# Patient Record
Sex: Male | Born: 1960 | Race: White | Hispanic: No | Marital: Married | Smoking: Never smoker
Health system: Southern US, Community
[De-identification: ages and names within clinical notes are randomized; demographics above are authoritative.]

## PROBLEM LIST (undated history)

## (undated) DIAGNOSIS — K5790 Diverticulosis of intestine, part unspecified, without perforation or abscess without bleeding: Secondary | ICD-10-CM

## (undated) DIAGNOSIS — E8881 Metabolic syndrome: Secondary | ICD-10-CM

## (undated) DIAGNOSIS — G4730 Sleep apnea, unspecified: Secondary | ICD-10-CM

## (undated) DIAGNOSIS — E119 Type 2 diabetes mellitus without complications: Secondary | ICD-10-CM

## (undated) DIAGNOSIS — L409 Psoriasis, unspecified: Secondary | ICD-10-CM

## (undated) DIAGNOSIS — K219 Gastro-esophageal reflux disease without esophagitis: Secondary | ICD-10-CM

## (undated) DIAGNOSIS — I4811 Longstanding persistent atrial fibrillation: Secondary | ICD-10-CM

## (undated) DIAGNOSIS — B001 Herpesviral vesicular dermatitis: Secondary | ICD-10-CM

## (undated) DIAGNOSIS — E785 Hyperlipidemia, unspecified: Secondary | ICD-10-CM

## (undated) DIAGNOSIS — M1990 Unspecified osteoarthritis, unspecified site: Secondary | ICD-10-CM

## (undated) DIAGNOSIS — Q2112 Patent foramen ovale: Secondary | ICD-10-CM

## (undated) DIAGNOSIS — I10 Essential (primary) hypertension: Secondary | ICD-10-CM

## (undated) DIAGNOSIS — R809 Proteinuria, unspecified: Secondary | ICD-10-CM

## (undated) DIAGNOSIS — E669 Obesity, unspecified: Secondary | ICD-10-CM

## (undated) DIAGNOSIS — G459 Transient cerebral ischemic attack, unspecified: Secondary | ICD-10-CM

## (undated) DIAGNOSIS — T7840XA Allergy, unspecified, initial encounter: Secondary | ICD-10-CM

## (undated) HISTORY — PX: PATENT FORAMEN OVALE(PFO) CLOSURE: CATH118300

## (undated) HISTORY — PX: TOTAL HIP ARTHROPLASTY: SHX124

## (undated) HISTORY — DX: Diverticulosis of intestine, part unspecified, without perforation or abscess without bleeding: K57.90

## (undated) HISTORY — DX: Allergy, unspecified, initial encounter: T78.40XA

## (undated) HISTORY — DX: Essential (primary) hypertension: I10

## (undated) HISTORY — DX: Proteinuria, unspecified: R80.9

## (undated) HISTORY — DX: Unspecified osteoarthritis, unspecified site: M19.90

## (undated) HISTORY — DX: Sleep apnea, unspecified: G47.30

## (undated) HISTORY — DX: Transient cerebral ischemic attack, unspecified: G45.9

## (undated) HISTORY — DX: Gastro-esophageal reflux disease without esophagitis: K21.9

## (undated) HISTORY — DX: Longstanding persistent atrial fibrillation: I48.11

## (undated) HISTORY — DX: Psoriasis, unspecified: L40.9

## (undated) HISTORY — DX: Patent foramen ovale: Q21.12

## (undated) HISTORY — PX: WISDOM TOOTH EXTRACTION: SHX21

## (undated) HISTORY — DX: Hyperlipidemia, unspecified: E78.5

## (undated) HISTORY — DX: Metabolic syndrome: E88.810

## (undated) HISTORY — PX: COLONOSCOPY: SHX174

## (undated) HISTORY — DX: Metabolic syndrome: E88.81

## (undated) HISTORY — PX: TONSILLECTOMY AND ADENOIDECTOMY: SUR1326

## (undated) HISTORY — DX: Herpesviral vesicular dermatitis: B00.1

## (undated) HISTORY — DX: Obesity, unspecified: E66.9

---

## 1999-10-06 ENCOUNTER — Encounter: Payer: Self-pay | Admitting: Cardiology

## 1999-10-06 ENCOUNTER — Ambulatory Visit (HOSPITAL_COMMUNITY): Admission: RE | Admit: 1999-10-06 | Discharge: 1999-10-06 | Payer: Self-pay | Admitting: Cardiology

## 1999-10-21 ENCOUNTER — Encounter: Payer: Self-pay | Admitting: Cardiology

## 1999-10-21 ENCOUNTER — Encounter: Admission: RE | Admit: 1999-10-21 | Discharge: 1999-10-21 | Payer: Self-pay | Admitting: Cardiology

## 1999-10-27 ENCOUNTER — Ambulatory Visit (HOSPITAL_COMMUNITY): Admission: RE | Admit: 1999-10-27 | Discharge: 1999-10-27 | Payer: Self-pay | Admitting: Cardiology

## 2004-08-31 HISTORY — PX: OTHER SURGICAL HISTORY: SHX169

## 2005-10-12 ENCOUNTER — Encounter: Admission: RE | Admit: 2005-10-12 | Discharge: 2006-01-10 | Payer: Self-pay | Admitting: Family Medicine

## 2005-10-26 ENCOUNTER — Ambulatory Visit (HOSPITAL_BASED_OUTPATIENT_CLINIC_OR_DEPARTMENT_OTHER): Admission: RE | Admit: 2005-10-26 | Discharge: 2005-10-26 | Payer: Self-pay | Admitting: Family Medicine

## 2005-11-01 ENCOUNTER — Ambulatory Visit: Payer: Self-pay | Admitting: Family Medicine

## 2005-11-09 ENCOUNTER — Ambulatory Visit (HOSPITAL_BASED_OUTPATIENT_CLINIC_OR_DEPARTMENT_OTHER): Admission: RE | Admit: 2005-11-09 | Discharge: 2005-11-09 | Payer: Self-pay | Admitting: Family Medicine

## 2006-09-08 ENCOUNTER — Ambulatory Visit: Payer: Self-pay | Admitting: Family Medicine

## 2007-08-09 ENCOUNTER — Ambulatory Visit: Payer: Self-pay | Admitting: Family Medicine

## 2007-09-01 DIAGNOSIS — G459 Transient cerebral ischemic attack, unspecified: Secondary | ICD-10-CM

## 2007-09-01 HISTORY — DX: Transient cerebral ischemic attack, unspecified: G45.9

## 2008-06-20 ENCOUNTER — Inpatient Hospital Stay (HOSPITAL_COMMUNITY): Admission: EM | Admit: 2008-06-20 | Discharge: 2008-06-20 | Payer: Self-pay | Admitting: Emergency Medicine

## 2008-06-21 ENCOUNTER — Ambulatory Visit: Payer: Self-pay | Admitting: Family Medicine

## 2008-06-28 ENCOUNTER — Ambulatory Visit (HOSPITAL_COMMUNITY): Admission: RE | Admit: 2008-06-28 | Discharge: 2008-06-28 | Payer: Self-pay | Admitting: Neurology

## 2008-07-04 ENCOUNTER — Ambulatory Visit: Payer: Self-pay

## 2008-07-04 ENCOUNTER — Encounter (INDEPENDENT_AMBULATORY_CARE_PROVIDER_SITE_OTHER): Payer: Self-pay | Admitting: Neurology

## 2008-08-06 ENCOUNTER — Encounter (INDEPENDENT_AMBULATORY_CARE_PROVIDER_SITE_OTHER): Payer: Self-pay | Admitting: Neurology

## 2008-08-06 ENCOUNTER — Ambulatory Visit (HOSPITAL_COMMUNITY): Admission: RE | Admit: 2008-08-06 | Discharge: 2008-08-06 | Payer: Self-pay | Admitting: Cardiology

## 2008-08-21 ENCOUNTER — Ambulatory Visit: Payer: Self-pay | Admitting: Family Medicine

## 2008-09-19 ENCOUNTER — Inpatient Hospital Stay (HOSPITAL_COMMUNITY): Admission: AD | Admit: 2008-09-19 | Discharge: 2008-09-20 | Payer: Self-pay | Admitting: Cardiology

## 2008-10-02 ENCOUNTER — Emergency Department (HOSPITAL_COMMUNITY): Admission: EM | Admit: 2008-10-02 | Discharge: 2008-10-02 | Payer: Self-pay | Admitting: Family Medicine

## 2008-11-19 ENCOUNTER — Encounter: Admission: RE | Admit: 2008-11-19 | Discharge: 2008-11-19 | Payer: Self-pay | Admitting: Cardiology

## 2008-11-21 ENCOUNTER — Ambulatory Visit (HOSPITAL_COMMUNITY): Admission: RE | Admit: 2008-11-21 | Discharge: 2008-11-21 | Payer: Self-pay | Admitting: Cardiology

## 2008-12-25 ENCOUNTER — Emergency Department (HOSPITAL_COMMUNITY): Admission: EM | Admit: 2008-12-25 | Discharge: 2008-12-25 | Payer: Self-pay | Admitting: Family Medicine

## 2009-07-17 ENCOUNTER — Encounter (INDEPENDENT_AMBULATORY_CARE_PROVIDER_SITE_OTHER): Payer: Self-pay | Admitting: *Deleted

## 2009-07-17 ENCOUNTER — Ambulatory Visit: Payer: Self-pay | Admitting: Family Medicine

## 2009-08-01 ENCOUNTER — Telehealth: Payer: Self-pay | Admitting: Gastroenterology

## 2009-08-05 ENCOUNTER — Ambulatory Visit: Payer: Self-pay | Admitting: Gastroenterology

## 2009-08-05 ENCOUNTER — Encounter (INDEPENDENT_AMBULATORY_CARE_PROVIDER_SITE_OTHER): Payer: Self-pay | Admitting: *Deleted

## 2009-08-05 DIAGNOSIS — G4733 Obstructive sleep apnea (adult) (pediatric): Secondary | ICD-10-CM | POA: Insufficient documentation

## 2009-08-05 DIAGNOSIS — Z8679 Personal history of other diseases of the circulatory system: Secondary | ICD-10-CM | POA: Insufficient documentation

## 2009-08-20 ENCOUNTER — Ambulatory Visit: Payer: Self-pay | Admitting: Gastroenterology

## 2010-06-30 ENCOUNTER — Ambulatory Visit: Payer: Self-pay | Admitting: Family Medicine

## 2010-08-05 ENCOUNTER — Ambulatory Visit: Payer: Self-pay | Admitting: Family Medicine

## 2010-09-21 ENCOUNTER — Encounter: Payer: Self-pay | Admitting: Cardiology

## 2010-09-24 ENCOUNTER — Other Ambulatory Visit (HOSPITAL_COMMUNITY): Payer: Self-pay | Admitting: Cardiovascular Disease

## 2010-09-24 DIAGNOSIS — Z8673 Personal history of transient ischemic attack (TIA), and cerebral infarction without residual deficits: Secondary | ICD-10-CM

## 2010-09-24 DIAGNOSIS — I4891 Unspecified atrial fibrillation: Secondary | ICD-10-CM

## 2010-10-09 ENCOUNTER — Ambulatory Visit (HOSPITAL_COMMUNITY)
Admission: RE | Admit: 2010-10-09 | Discharge: 2010-10-09 | Disposition: A | Discharge status: Home / Self Care | Payer: 59 | Source: Ambulatory Visit | Attending: Cardiovascular Disease | Admitting: Cardiovascular Disease

## 2010-10-09 ENCOUNTER — Encounter (HOSPITAL_COMMUNITY): Payer: Self-pay

## 2010-10-09 DIAGNOSIS — Z8673 Personal history of transient ischemic attack (TIA), and cerebral infarction without residual deficits: Secondary | ICD-10-CM

## 2010-10-09 DIAGNOSIS — I10 Essential (primary) hypertension: Secondary | ICD-10-CM | POA: Insufficient documentation

## 2010-10-09 DIAGNOSIS — F17200 Nicotine dependence, unspecified, uncomplicated: Secondary | ICD-10-CM | POA: Insufficient documentation

## 2010-10-09 DIAGNOSIS — I4891 Unspecified atrial fibrillation: Secondary | ICD-10-CM | POA: Insufficient documentation

## 2010-10-09 MED ORDER — TECHNETIUM TC 99M TETROFOSMIN IV KIT
10.0000 | PACK | Freq: Once | INTRAVENOUS | Status: AC | PRN
  Administered 2010-10-09: 10 via INTRAVENOUS

## 2010-10-09 MED ORDER — TECHNETIUM TC 99M TETROFOSMIN IV KIT
30.0000 | PACK | Freq: Once | INTRAVENOUS | Status: AC | PRN
  Administered 2010-10-09: 30 via INTRAVENOUS

## 2010-10-27 ENCOUNTER — Other Ambulatory Visit: Payer: Self-pay | Admitting: Orthopedic Surgery

## 2010-10-27 ENCOUNTER — Encounter (HOSPITAL_COMMUNITY): Payer: 59

## 2010-10-27 ENCOUNTER — Other Ambulatory Visit (HOSPITAL_COMMUNITY): Payer: Self-pay | Admitting: Orthopedic Surgery

## 2010-10-27 ENCOUNTER — Ambulatory Visit (HOSPITAL_COMMUNITY)
Admission: RE | Admit: 2010-10-27 | Discharge: 2010-10-27 | Disposition: A | Discharge status: Home / Self Care | Payer: 59 | Source: Ambulatory Visit | Attending: Orthopedic Surgery | Admitting: Orthopedic Surgery

## 2010-10-27 DIAGNOSIS — Z01818 Encounter for other preprocedural examination: Secondary | ICD-10-CM | POA: Insufficient documentation

## 2010-10-27 DIAGNOSIS — Z7902 Long term (current) use of antithrombotics/antiplatelets: Secondary | ICD-10-CM | POA: Insufficient documentation

## 2010-10-27 DIAGNOSIS — Z01812 Encounter for preprocedural laboratory examination: Secondary | ICD-10-CM | POA: Insufficient documentation

## 2010-10-27 DIAGNOSIS — M1610 Unilateral primary osteoarthritis, unspecified hip: Secondary | ICD-10-CM | POA: Insufficient documentation

## 2010-10-27 DIAGNOSIS — M169 Osteoarthritis of hip, unspecified: Secondary | ICD-10-CM | POA: Insufficient documentation

## 2010-10-27 DIAGNOSIS — I10 Essential (primary) hypertension: Secondary | ICD-10-CM | POA: Insufficient documentation

## 2010-10-27 LAB — COMPREHENSIVE METABOLIC PANEL
Albumin: 4.3 g/dL (ref 3.5–5.2)
Alkaline Phosphatase: 50 U/L (ref 39–117)
CO2: 26 mEq/L (ref 19–32)
Calcium: 9.5 mg/dL (ref 8.4–10.5)
GFR calc Af Amer: >60 mL/min (ref >60)
Glucose, Bld: 88 mg/dL (ref 70–99)
Potassium: 4 mEq/L (ref 3.5–5.1)

## 2010-10-27 LAB — URINALYSIS, ROUTINE W REFLEX MICROSCOPIC
Ketones, ur: NEGATIVE mg/dL
Leukocytes, UA: NEGATIVE
Nitrite: NEGATIVE
Specific Gravity, Urine: 1.023 (ref 1.005–1.030)

## 2010-10-27 LAB — CBC
HCT: 47.9 % (ref 39.0–52.0)
Hemoglobin: 17.3 g/dL — ABNORMAL HIGH (ref 13.0–17.0)
MCV: 87.7 fL (ref 78.0–100.0)
WBC: 7.5 10*3/uL (ref 4.0–10.5)

## 2010-10-27 LAB — APTT: aPTT: 44 seconds — ABNORMAL HIGH (ref 24–37)

## 2010-11-03 ENCOUNTER — Inpatient Hospital Stay (HOSPITAL_COMMUNITY)
Admission: RE | Admit: 2010-11-03 | Discharge: 2010-11-06 | DRG: 470 | Disposition: A | Discharge status: Home Health | Payer: 59 | Source: Ambulatory Visit | Attending: Orthopedic Surgery | Admitting: Orthopedic Surgery

## 2010-11-03 ENCOUNTER — Inpatient Hospital Stay (HOSPITAL_COMMUNITY): Payer: 59

## 2010-11-03 DIAGNOSIS — G4733 Obstructive sleep apnea (adult) (pediatric): Secondary | ICD-10-CM | POA: Diagnosis present

## 2010-11-03 DIAGNOSIS — Z9889 Other specified postprocedural states: Secondary | ICD-10-CM

## 2010-11-03 DIAGNOSIS — I10 Essential (primary) hypertension: Secondary | ICD-10-CM | POA: Diagnosis present

## 2010-11-03 DIAGNOSIS — Z6841 Body Mass Index (BMI) 40.0 and over, adult: Secondary | ICD-10-CM

## 2010-11-03 DIAGNOSIS — Z8673 Personal history of transient ischemic attack (TIA), and cerebral infarction without residual deficits: Secondary | ICD-10-CM

## 2010-11-03 DIAGNOSIS — M169 Osteoarthritis of hip, unspecified: Principal | ICD-10-CM | POA: Diagnosis present

## 2010-11-03 DIAGNOSIS — M1610 Unilateral primary osteoarthritis, unspecified hip: Principal | ICD-10-CM | POA: Diagnosis present

## 2010-11-03 HISTORY — PX: JOINT REPLACEMENT: SHX530

## 2010-11-03 LAB — TYPE AND SCREEN

## 2010-11-03 LAB — ABO/RH: ABO/RH(D): O POS

## 2010-11-04 LAB — CBC
HCT: 41.8 % (ref 39.0–52.0)
MCHC: 35.4 g/dL (ref 30.0–36.0)
MCV: 88 fL (ref 78.0–100.0)
Platelets: 189 10*3/uL (ref 150–400)
RBC: 4.75 MIL/uL (ref 4.22–5.81)
RDW: 13.4 % (ref 11.5–15.5)

## 2010-11-04 LAB — BASIC METABOLIC PANEL
BUN: 10 mg/dL (ref 6–23)
CO2: 29 mEq/L (ref 19–32)
Calcium: 9.2 mg/dL (ref 8.4–10.5)
Chloride: 99 mEq/L (ref 96–112)
Creatinine, Ser: 0.96 mg/dL (ref 0.4–1.5)
GFR calc Af Amer: >60 mL/min (ref >60)
Glucose, Bld: 155 mg/dL — ABNORMAL HIGH (ref 70–99)
Potassium: 4.5 mEq/L (ref 3.5–5.1)
Sodium: 135 mEq/L (ref 135–145)

## 2010-11-04 LAB — PROTIME-INR: INR: 1.13 (ref 0.00–1.49)

## 2010-11-05 LAB — CBC
MCV: 87.6 fL (ref 78.0–100.0)
Platelets: 174 10*3/uL (ref 150–400)
RBC: 4.61 MIL/uL (ref 4.22–5.81)
RDW: 13.3 % (ref 11.5–15.5)

## 2010-11-05 LAB — BASIC METABOLIC PANEL
Calcium: 9.1 mg/dL (ref 8.4–10.5)
Chloride: 100 mEq/L (ref 96–112)
Potassium: 4.3 mEq/L (ref 3.5–5.1)
Sodium: 136 mEq/L (ref 135–145)

## 2010-11-06 LAB — CBC
Hemoglobin: 14.4 g/dL (ref 13.0–17.0)
MCH: 31.3 pg (ref 26.0–34.0)
RBC: 4.6 MIL/uL (ref 4.22–5.81)
WBC: 13 10*3/uL — ABNORMAL HIGH (ref 4.0–10.5)

## 2010-11-06 NOTE — Op Note (Signed)
NAME:  [name], CHAP NO.:  [id number]  MEDICAL RECORD NO.:  [id number]           PATIENT TYPE:  I  LOCATION:  1621                         FACILITY:  [facility]  PHYSICIAN:  Ollen Gross, M.D.    DATE OF BIRTH:  Jun 07, 1961  DATE OF PROCEDURE:  11/03/2010 DATE OF DISCHARGE:                              OPERATIVE REPORT   PREOPERATIVE DIAGNOSIS:  Osteoarthritis, left hip.  POSTOPERATIVE DIAGNOSIS:  Osteoarthritis, left hip.  PROCEDURE:  Left total hip arthroplasty.  SURGEON:  Ollen Gross, MD  ASSISTANT:  Alexzandrew L. Perkins, P.A.C.  ANESTHESIA:  General.  ESTIMATED BLOOD LOSS:  600.  DRAINS:  Hemovac x1.  COMPLICATIONS:  None.  CONDITION:  Stable to recovery.  BRIEF CLINICAL NOTE:  Mr. [name] is a 50 year old male with advanced end- stage arthritis of the left hip with progressively worsening pain and dysfunction.  He has failed nonoperative management and presents for total hip arthroplasty.  PROCEDURE IN DETAIL:  After successful administration of general anesthetic, the patient was placed in the right lateral decubitus position with the left side up and held with the hip positioner.  The left lower extremity was isolated from his perineum with plastic drapes and prepped and draped in the usual sterile fashion.  Standard posterolateral incision was made with #10 blade through the subcutaneous tissue to the level of the fascia lata which was incised in line with the skin incision.  The sciatic nerve was palpated and protected and short rotators and capsule isolated off the femur.  Hip was dislocated and the center of femoral head was marked.  Trial prosthesis was placed such that the center of the trial head corresponds to the center of its native femoral head.  Osteotomy in line was marked on the femoral neck and osteotomy made with an oscillating saw.  Femoral head was removed.  Retractors were placed around the proximal femur for access to  the femoral canal.  The starter reamer was passed and then the canal was thoroughly irrigated.  This was done to remove the fatty contents. Axial reaming was performed up to 15.5 mm proximal reaming to a 20D in the sleeve machine to a small.  20D small trial sleeve was placed.  The femur was retracted anteriorly to gain acetabular exposure. Acetabular retractors were placed and labrum and osteophytes were removed.  Reaming was performed up to 55 mm for placement of 56 mm Pinnacle Acetabular shell.  It was placed in anatomic position with excellent purchase.  It transfixed with 2 additional dome screws.  The apex hole eliminator was placed and a 36-mm neutral plus 4 marathon liner placed.  We then placed the trial stem which is a 20 x 15 with a 36 plus 12 neck.  Native anteversion was neutral, so I put in about 15 degrees anteversion.  A 36.0 head was placed and the hip was reduced with outstanding stability.  There was full extension, full external rotation, 70 degrees flexion, 40 degrees adduction, 90 degrees internal rotation, and 90 degrees of flexion, and 70 degrees of internal rotation.  By placing the left leg on top of  the right, __________leg lengths were equal.  Hip was then dislocated and the trials removed. The permanent 20D small sleeve was placed with 20 x 15 stem, 36 plus 12 neck again about 15 degrees beyond native anteversion.  The 36 plus 0 ceramic head was placed and the hip was reduced with same stability parameters.  Wound was then copiously irrigated with saline solution and short rotators and capsule reattached to the femur through drill holes with Ethibond suture.  Fascia lata was closed over Hemovac drain with interrupted #1 Vicryl, subcu closed with #1 and #2-0 Vicryl, and subcuticular with running 4-0 Monocryl.  The catheter for Marcaine pain pump was placed and the pump initiated.  Incision was cleaned and dried and Steri-Strips and a bulky sterile dressing  were applied.  He was then placed into a knee immobilizer, awakened and transported to recovery in stable condition.     Ollen Gross, M.D.     FA/MEDQ  D:  11/03/2010  T:  11/04/2010  Job:  [id number]  Electronically Signed by Ollen Gross M.D. on 11/05/2010 03:51:36 PM

## 2010-11-19 NOTE — H&P (Signed)
[name], SIEDSCHLAG NO.:  [id number]  MEDICAL RECORD NO.:  [id number]           PATIENT TYPE:  I  LOCATION:  X001                         FACILITY:  [facility]  PHYSICIAN:  Ollen Gross, M.D.    DATE OF BIRTH:  Aug 09, 1961  DATE OF ADMISSION:  11/03/2010 DATE OF DISCHARGE:                             HISTORY & PHYSICAL   CHIEF COMPLAINT:  Left hip pain.  HISTORY OF PRESENT ILLNESS:  The patient 50-year male who has been seen by Dr. Lequita Halt for ongoing hip pain for many years now.  It has progressing gotten worse over the past several years.  He has been seen in the office, and x-rays show severe end-stage arthritis with bone-on- bone throughout with large osteophyte formation.  It is interfering with his daily activities.  It is felt he would benefit undergoing surgical intervention.  He has been seen preop by Dr. Susann Givens and felt to be stable for surgery.  ALLERGIES:  No known drug allergies.  CURRENT MEDICATIONS: 1. Nexium 40 mg. 2. Benazepril 40 mg. 3. Plavix 75 mg. 4. Diltiazem 240 mg.  PAST MEDICAL HISTORY: 1. History of a CVA in October 2010. 2. Sleep apnea, uses CPAP. 3. Hypertension. 4. Cardiac murmur. 5. Heart murmur secondary to patent foramen ovale. 6. Psoriasis.  PAST SURGICAL HISTORY:  Heart surgery with closure of PFO.  FAMILY HISTORY:  Father with cancer.  Mother with cancer.  SOCIAL HISTORY:  Married, is a Product/process development scientist.  No alcohol. Nonsmoker.  His wife will be assisting with his care after surgery. There are 4 steps entering his home.  He does have a living will and a healthcare power of attorney.  REVIEW OF SYSTEMS:  GENERAL:  No fevers, chills, or night sweats. NEUROLOGIC:  No seizures, syncope, or paralysis.  RESPIRATORY:  No shortness breath, productive cough, or hemoptysis.  CARDIOVASCULAR:  No chest pain or orthopnea.  GI:  No nausea, vomiting, diarrhea, or constipation.  GU:  No dysuria or hematuria.   MUSCULOSKELETAL:  Hip pain.  PHYSICAL EXAMINATION:  VITAL SIGNS:  Pulse 64, respirations 12, blood pressure 142/82.  GENERAL:  A 50 year old white male, well nourished, well developed, overweight, barrel-chested individual.  He is alert, oriented, and cooperative.  Good historian.  He is accompanied by his wife. HEENT:  Normocephalic, atraumatic.  Pupils are round and reactive.  EOMs intact. NECK:  Supple.  No carotid bruits are appreciated. CHEST:  Barrel-chested individual.  Clear anterior posterior chest walls. HEART:  Regular rate and rhythm with a faint systolic ejection murmur, slight over aortic and pulmonic points. ABDOMEN:  Mildly firm, protuberant abdomen.  Bowel sounds are present. RECTAL, BREASTS, AND GENITALIA:  Not done, not pertinent for present illness. EXTREMITIES:  Left hip, flexion 90, internal rotation about 20 degrees, external rotation contracture, abduction about 20-30 degrees.  IMPRESSION:  Osteoarthritis, left hip.  PLAN:  The patient is admitted to [facility] to undergo a left total hip replacement arthroplasty.  Surgery will be performed by Ollen Gross.     Alexzandrew L. Julien Girt, P.A.C.   ______________________________ Ollen Gross, M.D.    ALP/MEDQ  D:  11/03/2010  T:  11/03/2010  Job:  [id number]  cc:   Dr. Ozella Rocks, M.D. Fax: [phone]  Electronically Signed by Patrica Duel P.A.C. on 11/06/2010 10:36:31 AM Electronically Signed by Ollen Gross M.D. on 11/19/2010 08:17:34 AM

## 2010-12-11 LAB — PROTIME-INR: INR: 2.5 — ABNORMAL HIGH (ref 0.00–1.49)

## 2010-12-12 NOTE — Discharge Summary (Signed)
[name], RUHLMAN NO.:  [id number]  MEDICAL RECORD NO.:  [id number]           PATIENT TYPE:  I  LOCATION:  1621                         FACILITY:  [facility]  PHYSICIAN:  Ollen Gross, M.D.    DATE OF BIRTH:  1961-05-08  DATE OF ADMISSION:  11/03/2010 DATE OF DISCHARGE:  11/06/2010                              DISCHARGE SUMMARY   ADMITTING DIAGNOSES: 1. Osteoarthritis, left hip. 2. Past history of cerebrovascular accident, October 2010. 3. Sleep apnea. 4. Hypertension. 5. Cardiac murmur secondary to patent foramen ovale. 6. Psoriasis.  DISCHARGE DIAGNOSES: 1. Osteoarthritis, left hip, status post left total hip replacement     arthroplasty. 2. Past history of cerebrovascular accident, October 2010. 3. Sleep apnea. 4. Hypertension. 5. Cardiac murmur secondary to patent foramen ovale. 6. Psoriasis.  PROCEDURE:  November 03, 2010, left total hip.  SURGEON:  Ollen Gross, MD  ASSISTANT:  Alexzandrew L. Julien Girt, PAC.  ANESTHESIA:  General.  BLOOD LOSS:  600 mL.  CONSULTS:  None.  BRIEF HISTORY:  Mr. [name] is a 50 year old male with advanced arthritis of the left hip, progressive worsening pain and dysfunction, failed nonoperative management, now presents for total hip arthroplasty.  LABORATORY DATA:  I do not have the admission CBC but the postop hemoglobin was 14.8, dropped down to 14.2, last H and H 14.4 and 40.2. PT/INR postop 14.7 and 1.13.  I do not have the admission Chem panel, it was not scanned into the chart.  Serial BMETs were followed for 48 hours.  Electrolytes remained within normal limits.  Glucose was elevated at 155, came back down to 123 prior to discharge.  Blood group type O+.  Postoperative hip films and pelvis films on November 03, 2010, no complication after left total hip.  HOSPITAL COURSE:  The patient was admitted to [facility], taken to OR, underwent above-stated procedure without complication.  The patient  tolerated procedure well, later transferred to the recovery room at orthopedic floor, started on p.o. and IV analgesic for pain control following surgery, had PCA which was discontinued on the morning of day #1, did have a little bit intermittent nausea felt to be due to the IV narcotics, so we discontinued that, started partial weightbearing 25- 50%, had good urine output.  By day #2, the patient was doing a little bit better, able to get a little bit better sleep.  Still some intermittent nausea but slowly improving.  Dressing changed, incision looked good, so we discontinued the fluids once the patient's nausea had improved and tolerating p.o.'s well.  Continued to progress well with physical therapy and by postop day #3, the patient's nausea was gone. The patient is doing well, tolerating medications, and discharged home.  DISCHARGE PLAN: 1. The patient was discharged home on November 06, 2010. 2. Discharge diagnoses, please see above. 3. Discharge medications, Oxy-IR 10, Robaxin, Xarelto.  Continue home     meds of benazepril, diltiazem, Metamucil, and Nexium.  DIET:  Heart-healthy diet.  ACTIVITY:  Partial weightbearing 25-50%.  Hip precautions, total hip protocol.  Follow up in 2 weeks.  DISPOSITION:  Home.  CONDITION  ON DISCHARGE:  Improved.     Alexzandrew L. Julien Girt, P.A.C.   ______________________________ Ollen Gross, M.D.    ALP/MEDQ  D:  12/04/2010  T:  12/04/2010  Job:  [id number]  cc:   Dr. Coralee Pesa  Dr. Jonny Ruiz _____  Electronically Signed by Patrica Duel P.A.C. on 12/08/2010 07:49:22 AM Electronically Signed by Ollen Gross M.D. on 12/12/2010 09:01:01 AM

## 2010-12-15 LAB — STOOL CULTURE

## 2011-01-13 NOTE — Cardiovascular Report (Signed)
NAMEBRUNO, LEACH NO.:  [id number]   MEDICAL RECORD NO.:  [id number]          PATIENT TYPE:  INP   LOCATION:  2502                         FACILITY:  [facility]   PHYSICIAN:  Cristy Hilts. Jacinto Halim, MD       DATE OF BIRTH:  August 27, 1961   DATE OF PROCEDURE:  09/19/2008  DATE OF DISCHARGE:                            CARDIAC CATHETERIZATION   PROCEDURE PERFORMED:  Intracardiac echocardiogram-guided closure of the  patent foramen ovale.   INDICATION:  Mr. [name] is a 50 year old gentleman with a  cardioembolic stroke that occurred in October 2009.  It was felt to be  cardioembolic.  He was initially randomized in the RESPECT secondary  prevention trial for medical arm; however, he opted out of the medical  arm and wanted to have the closure of the PFO outside of the trial.   Given the fact that he had no other significant cardiovascular risk  except morbid obesity and metabolic syndrome, we felt that cardioembolic  stroke was most likely and PFO was probably the most likely culprit as  it was strongly positive with right-to-left shunting.  Hence, he was  brought to the catheterization lab for intracardiac echo-guided closure  of the patent foramen ovale.   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  There was also color flow evidence of right-to-left shunting.   INTERVENTION DATA:  Successful closure of the patent foramen ovale with  implantation of a 28 mm StarFlex septal occluder.  Postprocedure, there  was some mild to moderately positive residual right-to-left shunting.   RECOMMENDATION:  I expect complete closure of the patent foramen ovale  over a period of 3-6 months with endothelialization of the closure  device.  He will be discharged home in the morning with outpatient echo  to be done in 1-2  days.  He will need aspirin indefinitely and Plavix  for at least a period of 3 months.  He will need endocarditis  prophylaxis for a period of 6 months.   TECHNIQUE OF THE PROCEDURE:  Under usual sterile precautions using an 8-  [place] and a 9-French right femoral venous access, an intracardiac echo  probe was advanced into the right atrium and the cardiac structures were  analyzed.  Then using a multipurpose A2 catheter, I was easily able to  cross the patent foramen ovale.  The PFO was sized with a 25-mm NMT  sizing balloon, which measured out to be about 10-12 mm.  I decided to  implant a 28-mm septal occluder given significant aneurysmal septum and  thick septum secundum.  The device was appropriately prepped and after  crossing an 11-French Mullen sheath across the intra-atrial septum, left  atrial side, and then the right atrial side of the device was deployed  under ICE guidance and fluoroscopic guidance and position  of the intra-atrial septum between the 2 disks was confirmed and then  the device was released.  Double contrast injection was also performed.  The patient tolerated the procedure well.  The sheath was exchanged to a  short sheath.  During procedure, heparin was administered, and the ACT  was maintained greater than 200.      Cristy Hilts. Jacinto Halim, MD  Electronically Signed     JRG/MEDQ  D:  09/19/2008  T:  09/20/2008  Job:  [id number]   cc:   Pramod P. Pearlean Brownie, MD  Sharlot Gowda, M.D.

## 2011-01-13 NOTE — Discharge Summary (Signed)
[name], [name] NO.:  [id number]   MEDICAL RECORD NO.:  [id number]          PATIENT TYPE:  INP   LOCATION:  2502                         FACILITY:  [facility]   PHYSICIAN:  Cristy Hilts. Jacinto Halim, MD       DATE OF BIRTH:  01-22-1961   DATE OF ADMISSION:  09/19/2008  DATE OF DISCHARGE:  09/20/2008                               DISCHARGE SUMMARY   DISCHARGE DIAGNOSES:  1. Patent foramen ovale status post elective closure this admission.  2. Hypertension.  3. History of a left frontal cerebrovascular accident, October 2009.  4. Gastroesophageal reflux.   HOSPITAL COURSE:  The patient is a 50 year old male with hypertension  and borderline dyslipidemia who was admitted to [facility] with  dysarthria in October 2009.  He was eventually diagnosed with left brain  CVA.  It was felt to be cardioembolic.  Workup of his stroke included a  moderate to large size PFO.  He had strongly positive right-to-left  shunting by contrast injection.  He was seen by Dr. Jacinto Halim on September 13, 2008, set up for elective PFO closure.  Please see his note for complete  details.  The patient was admitted to outpatient cath lab and underwent  PFO closure which he tolerated well.  He should be on aspirin  indefinitely and Plavix for 3 months.  He needs endocarditis prophylaxis  for 6 months.  Dr. Jacinto Halim feels he be discharged on September 20, 2008.  He  will get an echo in the office this afternoon.   DISCHARGE MEDICATIONS:  1. Nexium 40 mg a day.  2. Lotrel 10/40 daily.  3. Simvastatin 20 mg a day.  4. Plavix 75 mg a day for 3 months.  5. Enteric-coated aspirin 325 mg daily indefinitely.   PREADMISSION LABORATORY DATA:  White count 7.5, hemoglobin 17.1,  hematocrit 48.9, platelets 184.  INR 1.0.  TSH 1.27.  BUN 21, creatinine  1.1.  Sodium 142, potassium 4.3.  Urinalysis is unremarkable.  EKG shows  sinus rhythm with septal Q-wave and poor anterior R-wave progression.  Chest x-ray shows  cardiomegaly.   DISPOSITION:  The patient is discharged in stable condition and will  follow up in the office this afternoon with an echo and then followup  with Dr. Jacinto Halim.  He did have some diarrhea before discharge and stool  culture was obtained and will need to follow this up as an outpatient.      Abelino Derrick, P.A.      Cristy Hilts. Jacinto Halim, MD  Electronically Signed    LKK/MEDQ  D:  09/20/2008  T:  09/20/2008  Job:  [id number]   cc:   Sharlot Gowda, M.D.  Pramod P. Pearlean Brownie, MD

## 2011-01-13 NOTE — Discharge Summary (Signed)
NAMELAMBERT, [name] NO.:  [id number]   MEDICAL RECORD NO.:  [id number]          PATIENT TYPE:  INP   LOCATION:  4705                         FACILITY:  [facility]   PHYSICIAN:  Renee Ramus, MD       DATE OF BIRTH:  Aug 14, 1961   DATE OF ADMISSION:  06/20/2008  DATE OF DISCHARGE:  06/20/2008                               DISCHARGE SUMMARY   PRIMARY DISCHARGE DIAGNOSIS:  Transient ischemic attack with transient  dysarthria.   SECONDARY DIAGNOSES:  1. Hypertension.  2. Gastroesophageal reflux disease.  3. Obesity.  4. Psoriasis.  5. Obstructive sleep apnea.   HOSPITAL COURSE:  1. Dysarthria and TIA.  The patient is a 50 year old male who was      admitted secondary to dysarthria and TIA.  The patient had no      previous stroke history.  The patient's symptoms have resolved.      The patient did have a CTA that showed no evidence of vascular      abnormalities and patent carotids bilaterally.  The patient is now      being discharged with instructions to follow up with his primary      care physician.  The patient will go home on aspirin.  He did have      a lipid panel ordered.  This is still pending.  The patient will be      going home on low-dose Crestor in addition to instruction to follow      up with his primary care physician within the next 2-3 days to      adjust blood pressure medications for goal systolic blood pressure      less than 120.  The patient is anxious for discharge and wishes to      go home as soon as possible  2. Hypertension as above.  Primary care physician will adjust      medications.  3. Gastroesophageal  reflux disease.  The patient will continue on his      proton-pump inhibitor.  4. Psoriasis.  Currently stable.  5. Obstructive sleep apnea.  The patient will continue with his with      his CPAP at night.  6. Obesity.  This is stable.  We have counseled the patient with      respect to weight loss.   LABORATORY DATA:  Of  note;  1. Mild erythrocytosis with hemoglobin of 17.3, hematocrit of 50.9.  2. Glucose mildly elevated at 113.  3. UA showing no evidence of infection.   STUDIES:  1. Chest x-ray showing cardiomegaly, but no evidence of CHF or acute      lung process.  2. CT head without contrast showing no acute disease.  3. CT angiogram of the neck and head showing no evidence of disease or      any unremarkable findings.   MEDICATIONS ON DISCHARGE:  1. Lotrel 10/20 one p.o. daily.  2. Nexium 40 mg p.o. daily.  3. Crestor 10 mg p.o. daily.  4. Aspirin 81 mg p.o. daily.   Lipid panel is currently  pending, otherwise there are no labs or studies  pending.  At the time of discharge, the patient is in stable condition  and anxious for discharge.   TIME SPENT:  Thirty-five minutes.      Renee Ramus, MD  Electronically Signed     JF/MEDQ  D:  06/20/2008  T:  06/21/2008  Job:  [id number]   cc:   Sharlot Gowda, M.D.

## 2011-01-13 NOTE — Cardiovascular Report (Signed)
NAMEJONEL, WELDON NO.:  [id number]   MEDICAL RECORD NO.:  [id number]          PATIENT TYPE:  OIB   LOCATION:  2899                         FACILITY:  [facility]   PHYSICIAN:  Cristy Hilts. Jacinto Halim, MD       DATE OF BIRTH:  Aug 05, 1961   DATE OF PROCEDURE:  11/21/2008  DATE OF DISCHARGE:  11/21/2008                            CARDIAC CATHETERIZATION   PROCEDURE PERFORMED:  Direct current cardioversion.   INDICATIONS:  Atrial fibrillation.   TECHNIQUE:  Using 350 mg of Pentothal to achieve deep sedation and help  of anesthesia, synchronized direct current cardioversion was performed  using 120 joules x2 with successful cardioversion of atrial fibrillation  to sinus rhythm.  The patient tolerated the procedure well.  No  immediate complications noted.      Cristy Hilts. Jacinto Halim, MD  Electronically Signed     JRG/MEDQ  D:  11/21/2008  T:  11/22/2008  Job:  [id number]

## 2011-01-16 NOTE — Procedures (Signed)
NAME:  [name], [name] NO.:  [id number]   MEDICAL RECORD NO.:  [id number]          PATIENT TYPE:  OUT   LOCATION:  SLEEP CENTER                 FACILITY:  [facility]   PHYSICIAN:  Clinton D. Maple Hudson, M.D. DATE OF BIRTH:  07-30-1961   DATE OF STUDY:  10/26/2005                              NOCTURNAL POLYSOMNOGRAM   REFERRING PHYSICIAN:  Dr. Sharlot Gowda.   DATE OF STUDY:  October 26, 2005.   INDICATION FOR STUDY:  Hypersomnia with sleep apnea. Epworth sleepiness  score 7/24, BMI 42.6, weight 298 pounds. Home medications: Lotrel, Nexium.  Note that the patient sleeps in a recliner because of back pain. An NPSG  protocol was requested.   SLEEP ARCHITECTURE:  Total sleep time 331 minutes with sleep efficiency 82%.  Stage I was 9%, stage II 77%, stages III and IV 2%, REM 13% of total sleep  time. Sleep latency 53 minutes, REM latency 165 minutes, awake after sleep  onset 29 minutes, arousal index 17.4. No bedtime medication taken.   RESPIRATORY DATA:  Apnea-hypopnea index (AHI, RDI) 47.3 obstructive events  per hour. This reflected 174 obstructive apneas and 87 hypopneas. Events  were not positional, significantly frequent while supine and on his left  side. He slept on his left side through much of the night. REM AHI 70 per  hour.   OXYGEN DATA:  Loud snoring with oxygen desaturation to a nadir of 75%. Mean  oxygen saturation through the study was 92% on room air.   CARDIAC DATA:  Sinus rhythm with PVCs.   MOVEMENT/PARASOMNIA:  Occasional leg jerk with little effect on sleep.   IMPRESSION/RECOMMENDATION:  1.  Severe obstructive sleep apnea/hypopnea syndrome, apnea-hypopnea index      47.3 per hour with events not positional, recorded while supine and on      left side. Loud snoring with oxygen desaturation to a nadir of 75%.  2.  Consider return for continuous positive airway pressure titration or      evaluate for alternative therapies as      appropriate.  3.   Note effect of back pain limiting preferred sleep positions.      Clinton D. Maple Hudson, M.D.  Diplomate, Biomedical engineer of Sleep Medicine  Electronically Signed     CDY/MEDQ  D:  11/01/2005 08:55:22  T:  11/01/2005 23:48:30  Job:  [id number]

## 2011-01-16 NOTE — Cardiovascular Report (Signed)
[place]  Patient:    [name], [name]                       MRN: [id number] Proc. Date: 10/27/99 Adm. Date:  [id number] Attending:  Loreli Dollar CC:         Ronnald Nian, M.D.             Cardiac Catheterization Laboratory                        Cardiac Catheterization  PROCEDURES: 1. Left heart catheterization. 2. Ventriculography in the right anterior oblique projection x 2. 3. Selective right and left coronary arteriography.  COMPLICATIONS:  None.  INDICATIONS FOR PROCEDURE:  [name] is a 50 year old male who has been in good health except for mild blood pressure elevation.  He had a abnormal ECG with T ave abnormalities on a routine physical examination and underwent a Cardiolite study that showed a 44% ejection fraction scar formation on the inferior apical wall nd questionable ischemia at the apex of the heart.  Because of this he is brought n for a cardiac catheterization.  DESCRIPTION OF PROCEDURE:  After obtaining informed consent, the patient was prepared and draped in the usual sterile fashion exposing the right groin. Following local anesthetic with 1% Xylocaine the Seldinger technique was employed, and a 6 [place] introducer sheath was placed in the right femoral artery.  The 6  French Judkins configuration catheters were used.  Selective right and left coronary arteriography and ventriculography in the RAO projection was performed.  RESULTS: 1. Hemodynamic monitoring:  Central aortic pressure 141/86, left    ventricular pressure 146/21 and there was a 3 mm aortic valve gradient noted    at the time of pullback. 2. Ventriculography:  Ventriculography in the RAO projection revealed    the left ventricle to be slightly dilated.  There was normal    left ventricular systolic function.  There was mild left ventricular    hypertrophy.  Ejection fraction calculated at 78% and the end-diastolic    pressure was  22.  CORONARY ARTERIOGRAPHY:  There was no calcification noted on fluoroscopy. 1. Left main:  Normal. 2. LAD.  The LAD extended down to the apex of the heart.  The proximal and    mid LAD was about 4 mm and it tapered to about 2 mm in the distal segment.    No discrete obstructions were noted.  There was marked tortuosity.  The    diagonal was also free of disease. 3. Circumflex:  The circumflex gave rise to two large OM vessels.  This entire    system was free of disease. 4. Right coronary artery:  The right coronary artery was a dominant vessel    giving rise to a PDA and a posterolateral branch.  This system was free    of disease.  CONCLUSIONS: 1. No evidence of coronary artery disease. 2. Slight dilatation of left ventricle with normal systolic function. 3. Mild left ventricular hypertrophy.  DISCUSSION:  At this point I suspect the nuclear study was clearly a false-positive study.  There is nothing to suggest that [name] has CAD and all he needs is  treatment for his hypertension. DD:  10/27/99 TD:  10/27/99 Job: [id number]

## 2011-01-16 NOTE — Procedures (Signed)
NAME:  [name], [name] NO.:  [id number]   MEDICAL RECORD NO.:  [id number]          PATIENT TYPE:  OUT   LOCATION:  SLEEP CENTER                 FACILITY:  [facility]   PHYSICIAN:  Clinton D. Maple Hudson, M.D. DATE OF BIRTH:  24-Oct-1960   DATE OF STUDY:  11/09/2005                              NOCTURNAL POLYSOMNOGRAM   REFERRING PHYSICIAN:  Dr. Sharlot Gowda.   INDICATIONS FOR STUDY:  Hypersomnia with sleep apnea.   EPWORTH SLEEPINESS SCORE:  8/24. BMI 42.8.  Weight 300 pounds.   MEDICATIONS:  Lotrel, Nexium.   A baseline diagnostic study on October 26, 2005 recorded a AHI of 47.3 per  hour.  CPAP titration is requested.   SLEEP ARCHITECTURE:  Total sleep time 300 minutes with sleep efficiency 80%.  Stage I was 8%, stage II 80%, stages III and IV were absent.  REM 12% of  total sleep time.  Sleep latency 6 minutes, REM latency 84 minutes.  Awake  after sleep onset 69 minutes.  Arousal index 27.  No bedtime medication was  taken.   RESPIRATORY DATA:  CPAP titration to 22 CWP, AHI 0 per hour.  A large  Respironics ComfortGel full face mask was used with heated humidifier.   OXYGEN DATA:  Snoring was suppressed at final CPAP pressures with oxygen  saturation on CPAP held 94 to 98% on room air.   CARDIAC DATA:  Recurrent unifocal PVCs.   MOVEMENT/PARASOMNIA:  A total of 630 limb jerks were recorded of which 18  were associated with arousal or awakening for a periodic limb movement with  arousal index of 3.6 per hour which is mildly increased.  No bathroom trips.  Because of back problems, he could not stay on his back (sleeps on a  recliner at home).  He propped himself with multiple pillows under his head,  arms and between his legs.   IMPRESSION/RECOMMENDATIONS:  1.  Successful CPAP titration to a relatively high pressure of 22 CWP, AHI 0      per hour.  This may require a bilevel machine.  A large Respironics      comfort full face mask was used with heated  humidifier.  2.  Baseline MPSG on October 26, 2005 recorded an AHI of 47.3 per hour.  3.  Frequent PVCs.  4.  Back discomfort requires sleeping in a recliner at home and use of      multiple pillows while in bed for this sleep study.      Clinton D. Maple Hudson, M.D.  Diplomate, Biomedical engineer of Sleep Medicine  Electronically Signed     CDY/MEDQ  D:  11/29/2005 10:34:16  T:  11/30/2005 11:35:05  Job:  [id number]

## 2011-03-05 ENCOUNTER — Telehealth: Payer: Self-pay | Admitting: Family Medicine

## 2011-03-05 NOTE — Telephone Encounter (Signed)
PT'S INS DENIED TACLONEX, PER JCL DERMATOLOGIST NEEDS TO HANDLE CHANGING RX TO FORMULARY.  CALLED PT AND HE WILL MAKE APPT WITH DERMATOLOGIST.  LIST OF PREFERRED MEDS ARE IN HIS CHART-LM

## 2011-06-01 ENCOUNTER — Other Ambulatory Visit: Payer: Self-pay | Admitting: Family Medicine

## 2011-06-02 LAB — URINALYSIS, ROUTINE W REFLEX MICROSCOPIC
Leukocytes, UA: NEGATIVE
Nitrite: NEGATIVE
Protein, ur: 30 — AB
Specific Gravity, Urine: 1.012
Urobilinogen, UA: 1

## 2011-06-02 LAB — TYPE AND SCREEN
ABO/RH(D): O POS
Antibody Screen: NEGATIVE

## 2011-06-02 LAB — CBC
HCT: 50.9
Platelets: 188
WBC: 6.9

## 2011-06-02 LAB — LIPID PANEL
Cholesterol: 169
Total CHOL/HDL Ratio: 6.3
VLDL: 46 — ABNORMAL HIGH

## 2011-06-02 LAB — PROTIME-INR
INR: 1
Prothrombin Time: 13.5

## 2011-06-02 LAB — DIFFERENTIAL
Eosinophils Relative: 3
Lymphocytes Relative: 36
Lymphs Abs: 2.5
Neutro Abs: 3.5

## 2011-06-02 LAB — BASIC METABOLIC PANEL
BUN: 16
GFR calc non Af Amer: >60
Potassium: 3.9
Sodium: 138

## 2011-06-02 LAB — URINE MICROSCOPIC-ADD ON

## 2011-06-02 LAB — APTT: aPTT: 34

## 2011-07-21 ENCOUNTER — Encounter: Payer: Self-pay | Admitting: Family Medicine

## 2011-08-17 ENCOUNTER — Ambulatory Visit (INDEPENDENT_AMBULATORY_CARE_PROVIDER_SITE_OTHER): Payer: 59 | Admitting: Family Medicine

## 2011-08-17 ENCOUNTER — Encounter: Payer: Self-pay | Admitting: Family Medicine

## 2011-08-17 VITALS — BP 146/90 | HR 69 | Ht 70.5 in | Wt 305.0 lb

## 2011-08-17 DIAGNOSIS — L408 Other psoriasis: Secondary | ICD-10-CM

## 2011-08-17 DIAGNOSIS — E669 Obesity, unspecified: Secondary | ICD-10-CM | POA: Insufficient documentation

## 2011-08-17 DIAGNOSIS — Z8679 Personal history of other diseases of the circulatory system: Secondary | ICD-10-CM

## 2011-08-17 DIAGNOSIS — K319 Disease of stomach and duodenum, unspecified: Secondary | ICD-10-CM | POA: Insufficient documentation

## 2011-08-17 DIAGNOSIS — G4730 Sleep apnea, unspecified: Secondary | ICD-10-CM

## 2011-08-17 DIAGNOSIS — L4050 Arthropathic psoriasis, unspecified: Secondary | ICD-10-CM | POA: Insufficient documentation

## 2011-08-17 DIAGNOSIS — E66813 Obesity, class 3: Secondary | ICD-10-CM

## 2011-08-17 DIAGNOSIS — Z8673 Personal history of transient ischemic attack (TIA), and cerebral infarction without residual deficits: Secondary | ICD-10-CM

## 2011-08-17 DIAGNOSIS — K219 Gastro-esophageal reflux disease without esophagitis: Secondary | ICD-10-CM

## 2011-08-17 DIAGNOSIS — L400 Psoriasis vulgaris: Secondary | ICD-10-CM

## 2011-08-17 DIAGNOSIS — Z0000 Encounter for general adult medical examination without abnormal findings: Secondary | ICD-10-CM

## 2011-08-17 DIAGNOSIS — Z23 Encounter for immunization: Secondary | ICD-10-CM

## 2011-08-17 MED ORDER — DILTIAZEM HCL ER COATED BEADS 240 MG PO CP24: 240.0000 mg | ORAL_CAPSULE | Freq: Every day | ORAL | Status: DC

## 2011-08-17 MED ORDER — BENAZEPRIL HCL 40 MG PO TABS: 40.0000 mg | ORAL_TABLET | Freq: Every day | ORAL | Status: DC

## 2011-08-17 MED ORDER — ESOMEPRAZOLE MAGNESIUM 40 MG PO CPDR: 40.0000 mg | DELAYED_RELEASE_CAPSULE | ORAL | Status: DC

## 2011-08-17 NOTE — Progress Notes (Signed)
Subjective:    Patient ID: [name], male    DOB: 04/26/1961, 50 y.o.   MRN: [id number]  HPI He is here for complete examination. He had a hip replacement in March. Over the last 4 months he has been involved in a new exercise program. He has lost 3 inches in his waist. He does use Nexium every 3 days which controls his symptoms. He did see his cardiologist prior to the hip surgery. He continues to be plagued by his psoriasis and does plan to followup in the near future with dermatology concerning this. He also has sleep apnea and presently is using the CPAP machine with good results. He has not had a readout recently. He has had no chest pain, shortness of breath or weakness, numbness or tingling. He is seen periodically by neurology. His work continues to go slowly. His marriage is going quite well.   Review of Systems Negative except as above    Objective:   Physical Exam BP 146/90  Pulse 69  Ht 5' 10.5" (1.791 m)  Wt 305 lb (138.347 kg)  BMI 43.14 kg/m2  General Appearance:    Alert, cooperative, no distress, appears stated age  Head:    Normocephalic, without obvious abnormality, atraumatic  Eyes:    PERRL, conjunctiva/corneas clear, EOM's intact, fundi    benign  Ears:    Normal TM's and external ear canals  Nose:   Nares normal, mucosa normal, no drainage or sinus   tenderness  Throat:   Lips, mucosa, and tongue normal; teeth and gums normal  Neck:   Supple, no lymphadenopathy;  thyroid:  no   enlargement/tenderness/nodules; no carotid   bruit or JVD  Back:    Spine nontender, no curvature, ROM normal, no CVA     tenderness  Lungs:     Clear to auscultation bilaterally without wheezes, rales or     ronchi; respirations unlabored  Chest Wall:    No tenderness or deformity   Heart:    Regular rate and rhythm, S1 and S2 normal, no murmur, rub   or gallop  Breast Exam:    No chest wall tenderness, masses or gynecomastia  Abdomen:     Soft, non-tender, nondistended,  normoactive bowel sounds,    no masses, no hepatosplenomegaly  Genitalia:    scrotum does have plaque-like lesions.  Testicles without masses.  No inguinal hernias.  Rectal:   deferred   Extremities:   No clubbing, cyanosis or edema  Pulses:   2+ and symmetric all extremities  Skin:  multiple erythematous plaque-like lesions are noted over his torso, arms and legs.   Lymph nodes:   Cervical, supraclavicular, and axillary nodes normal  Neurologic:   CNII-XII intact, normal strength, sensation and gait; reflexes 2+ and symmetric throughout          Psych:   Normal mood, affect, hygiene and grooming.           Assessment & Plan:   1. Routine general medical examination at a health care facility  TB Skin Test, PSA, CBC with Differential, Comprehensive metabolic panel, Lipid panel  2. GERD (gastroesophageal reflux disease)    3. Obesity, Class III, BMI 40-49.9 (morbid obesity)    4. Plaque psoriasis    5. Hx-TIA (transient ischemic attack)    6. Hx of atrial fibrillation, no current medication    7. Sleep apnea     we will give a download from his CPAP machine. He will be seen by  dermatology in the near future. He is to continue on his GERD medication. Continue on his benazepril.

## 2011-08-18 ENCOUNTER — Telehealth: Payer: Self-pay

## 2011-08-18 LAB — COMPREHENSIVE METABOLIC PANEL
Albumin: 4.7 g/dL (ref 3.5–5.2)
Alkaline Phosphatase: 60 U/L (ref 39–117)
BUN: 13 mg/dL (ref 6–23)
CO2: 25 mEq/L (ref 19–32)
Glucose, Bld: 99 mg/dL (ref 70–99)
Potassium: 4.3 mEq/L (ref 3.5–5.3)
Sodium: 138 mEq/L (ref 135–145)
Total Protein: 7.1 g/dL (ref 6.0–8.3)

## 2011-08-18 LAB — LIPID PANEL
Cholesterol: 180 mg/dL (ref 0–200)
HDL: 42 mg/dL (ref >39)
LDL Cholesterol: 97 mg/dL (ref 0–99)
Triglycerides: 206 mg/dL — ABNORMAL HIGH (ref <150)

## 2011-08-18 LAB — CBC WITH DIFFERENTIAL/PLATELET
Basophils Relative: 1 % (ref 0–1)
Eosinophils Absolute: 0.2 10*3/uL (ref 0.0–0.7)
HCT: 52.5 % — ABNORMAL HIGH (ref 39.0–52.0)
Hemoglobin: 18.6 g/dL — ABNORMAL HIGH (ref 13.0–17.0)
Lymphs Abs: 3.1 10*3/uL (ref 0.7–4.0)
MCH: 32.2 pg (ref 26.0–34.0)
MCHC: 35.4 g/dL (ref 30.0–36.0)
Monocytes Absolute: 0.5 10*3/uL (ref 0.1–1.0)
Monocytes Relative: 6 % (ref 3–12)
Neutro Abs: 4.4 10*3/uL (ref 1.7–7.7)
RBC: 5.78 MIL/uL (ref 4.22–5.81)

## 2011-08-18 NOTE — Telephone Encounter (Signed)
Called pt to find who takes care of his machine he told me [facility]

## 2011-10-13 ENCOUNTER — Telehealth: Payer: Self-pay | Admitting: Family Medicine

## 2011-10-13 MED ORDER — HYDROCODONE-ACETAMINOPHEN 5-325 MG PO TABS: 1.0000 | ORAL_TABLET | ORAL | Status: DC | PRN

## 2011-10-13 MED ORDER — CARISOPRODOL 350 MG PO TABS: 350.0000 mg | ORAL_TABLET | Freq: Three times a day (TID) | ORAL | Status: DC | PRN

## 2011-10-13 NOTE — Telephone Encounter (Signed)
[name] called in soma and hydrocodone for pt into pharmacy

## 2011-10-16 ENCOUNTER — Other Ambulatory Visit: Payer: Self-pay

## 2011-10-16 ENCOUNTER — Other Ambulatory Visit: Payer: Self-pay | Admitting: Family Medicine

## 2011-10-16 MED ORDER — CARISOPRODOL 350 MG PO TABS: 350.0000 mg | ORAL_TABLET | Freq: Three times a day (TID) | ORAL | Status: DC | PRN

## 2011-10-16 MED ORDER — OXYCODONE-ACETAMINOPHEN 5-325 MG PO TABS: 1.0000 | ORAL_TABLET | Freq: Three times a day (TID) | ORAL | Status: AC | PRN

## 2011-10-16 NOTE — Progress Notes (Signed)
He continues to have back pain and spasm. I will renew his soma and oxycodone.

## 2011-10-16 NOTE — Telephone Encounter (Signed)
CALLED [facility] IN PER JCL 

## 2011-10-28 ENCOUNTER — Telehealth: Payer: Self-pay | Admitting: Family Medicine

## 2011-10-28 NOTE — Telephone Encounter (Signed)
DONE

## 2011-11-16 DIAGNOSIS — Z0279 Encounter for issue of other medical certificate: Secondary | ICD-10-CM

## 2011-11-25 ENCOUNTER — Ambulatory Visit
Admission: RE | Admit: 2011-11-25 | Discharge: 2011-11-25 | Disposition: A | Discharge status: Home / Self Care | Payer: No Typology Code available for payment source | Source: Ambulatory Visit | Attending: Family Medicine | Admitting: Family Medicine

## 2011-11-25 ENCOUNTER — Other Ambulatory Visit: Payer: Self-pay | Admitting: Family Medicine

## 2011-11-25 DIAGNOSIS — Z0000 Encounter for general adult medical examination without abnormal findings: Secondary | ICD-10-CM

## 2011-12-10 ENCOUNTER — Telehealth: Payer: Self-pay | Admitting: Family Medicine

## 2011-12-10 NOTE — Telephone Encounter (Signed)
LM

## 2012-07-25 ENCOUNTER — Encounter: Payer: Self-pay | Admitting: Family Medicine

## 2012-07-25 ENCOUNTER — Ambulatory Visit (INDEPENDENT_AMBULATORY_CARE_PROVIDER_SITE_OTHER): Payer: 59 | Admitting: Family Medicine

## 2012-07-25 VITALS — BP 150/90 | HR 75 | Wt 302.0 lb

## 2012-07-25 DIAGNOSIS — M461 Sacroiliitis, not elsewhere classified: Secondary | ICD-10-CM

## 2012-07-25 MED ORDER — OXYCODONE-ACETAMINOPHEN 5-325 MG PO TABS: 1.0000 | ORAL_TABLET | Freq: Three times a day (TID) | ORAL | Status: DC | PRN

## 2012-07-25 NOTE — Patient Instructions (Signed)
Heat 20 minutes 3 times per day. Anti-inflammatory of choice regularly for the next week. Watch lots of football games

## 2012-07-25 NOTE — Progress Notes (Signed)
  Subjective:    Patient ID: [name], male    DOB: 1961/05/01, 51 y.o.   MRN: [id number]  HPI He is here for evaluation of right-sided back pain. He has had intermittent difficulty with this for several months. No numbness, tingling or weakness is noted.   Review of Systems     Objective:   Physical Exam Alert and in no distress. Tender over right SI joint. Figure 4 and stork test was positive. Straight leg raising negative. DTRs were her to evaluate.      Assessment & Plan:   1. Sacroiliitis  oxyCODONE-acetaminophen (ROXICET) 5-325 MG per tablet   Recommend heat, stretching, anti-inflammatory. I will also give Percocet but cautioned him on using this judiciously.

## 2012-07-26 MED ORDER — OXYCODONE-ACETAMINOPHEN 5-325 MG PO TABS: 1.0000 | ORAL_TABLET | Freq: Three times a day (TID) | ORAL | Status: DC | PRN

## 2012-07-26 MED ORDER — CARISOPRODOL 350 MG PO TABS: 350.0000 mg | ORAL_TABLET | Freq: Three times a day (TID) | ORAL | Status: DC | PRN

## 2012-07-26 NOTE — Addendum Note (Signed)
Addended by: Ronnald Nian on: 07/26/2012 08:41 AM   Modules accepted: Orders

## 2012-07-26 NOTE — Progress Notes (Signed)
  Subjective:    Patient ID: [name], male    DOB: 10/13/1960, 51 y.o.   MRN: [id number]  HPI    Review of Systems     Objective:   Physical Exam        Assessment & Plan:  The oxycodone was written to make sure he has enough to last 2 weekends. We'll also give him soma at his request.

## 2012-08-25 ENCOUNTER — Encounter: Payer: Self-pay | Admitting: Internal Medicine

## 2012-09-01 ENCOUNTER — Other Ambulatory Visit: Payer: Self-pay | Admitting: Family Medicine

## 2012-09-02 ENCOUNTER — Telehealth: Payer: Self-pay | Admitting: Internal Medicine

## 2012-09-02 MED ORDER — PANTOPRAZOLE SODIUM 40 MG PO TBEC: 40.0000 mg | DELAYED_RELEASE_TABLET | Freq: Every day | ORAL | Status: DC

## 2012-09-02 NOTE — Telephone Encounter (Signed)
Patient changed to Protonix due to cost of Nexium.

## 2012-09-02 NOTE — Telephone Encounter (Signed)
Pt is receiving nexium from [facility], however at the beginning of this year Nexium  Will be changing from $0 to $25 per month. Pantoprazole(Protonix) will be available to patients for $0.00.

## 2012-09-05 ENCOUNTER — Ambulatory Visit (INDEPENDENT_AMBULATORY_CARE_PROVIDER_SITE_OTHER): Payer: 59 | Admitting: Family Medicine

## 2012-09-05 ENCOUNTER — Encounter: Payer: Self-pay | Admitting: Family Medicine

## 2012-09-05 VITALS — BP 130/80 | HR 65 | Ht 69.5 in | Wt 296.0 lb

## 2012-09-05 DIAGNOSIS — Z8673 Personal history of transient ischemic attack (TIA), and cerebral infarction without residual deficits: Secondary | ICD-10-CM | POA: Insufficient documentation

## 2012-09-05 DIAGNOSIS — R195 Other fecal abnormalities: Secondary | ICD-10-CM

## 2012-09-05 DIAGNOSIS — I10 Essential (primary) hypertension: Secondary | ICD-10-CM

## 2012-09-05 DIAGNOSIS — Z0000 Encounter for general adult medical examination without abnormal findings: Secondary | ICD-10-CM

## 2012-09-05 DIAGNOSIS — G4733 Obstructive sleep apnea (adult) (pediatric): Secondary | ICD-10-CM

## 2012-09-05 DIAGNOSIS — L408 Other psoriasis: Secondary | ICD-10-CM

## 2012-09-05 DIAGNOSIS — Z23 Encounter for immunization: Secondary | ICD-10-CM

## 2012-09-05 DIAGNOSIS — L400 Psoriasis vulgaris: Secondary | ICD-10-CM

## 2012-09-05 DIAGNOSIS — K219 Gastro-esophageal reflux disease without esophagitis: Secondary | ICD-10-CM

## 2012-09-05 LAB — COMPREHENSIVE METABOLIC PANEL
ALT: 54 U/L — ABNORMAL HIGH (ref 0–53)
BUN: 14 mg/dL (ref 6–23)
CO2: 26 mEq/L (ref 19–32)
Calcium: 9.7 mg/dL (ref 8.4–10.5)
Chloride: 101 mEq/L (ref 96–112)
Creat: 0.97 mg/dL (ref 0.50–1.35)
Glucose, Bld: 100 mg/dL — ABNORMAL HIGH (ref 70–99)

## 2012-09-05 LAB — CBC WITH DIFFERENTIAL/PLATELET
Eosinophils Relative: 2 % (ref 0–5)
HCT: 50.9 % (ref 39.0–52.0)
Hemoglobin: 18.4 g/dL — ABNORMAL HIGH (ref 13.0–17.0)
Lymphocytes Relative: 36 % (ref 12–46)
Lymphs Abs: 3.4 10*3/uL (ref 0.7–4.0)
MCH: 32.1 pg (ref 26.0–34.0)
MCV: 88.8 fL (ref 78.0–100.0)
Monocytes Absolute: 0.7 10*3/uL (ref 0.1–1.0)
Monocytes Relative: 7 % (ref 3–12)
RBC: 5.73 MIL/uL (ref 4.22–5.81)
WBC: 9.6 10*3/uL (ref 4.0–10.5)

## 2012-09-05 LAB — POCT URINALYSIS DIPSTICK
Ketones, UA: NEGATIVE
Protein, UA: 30
Spec Grav, UA: <=1.005
pH, UA: 6

## 2012-09-05 LAB — HEMOCCULT GUIAC POC 1CARD (OFFICE)

## 2012-09-05 LAB — LIPID PANEL: Cholesterol: 181 mg/dL (ref 0–200)

## 2012-09-05 MED ORDER — CLOPIDOGREL BISULFATE 75 MG PO TABS: 75.0000 mg | ORAL_TABLET | Freq: Every day | ORAL | Status: DC

## 2012-09-05 MED ORDER — INFLUENZA VIRUS VACC SPLIT PF IM SUSP: 0.5000 mL | Freq: Once | INTRAMUSCULAR | Status: DC

## 2012-09-05 MED ORDER — DILTIAZEM HCL ER COATED BEADS 240 MG PO CP24: 240.0000 mg | ORAL_CAPSULE | Freq: Every day | ORAL | Status: DC

## 2012-09-05 MED ORDER — BENAZEPRIL HCL 40 MG PO TABS: 40.0000 mg | ORAL_TABLET | Freq: Every day | ORAL | Status: DC

## 2012-09-05 NOTE — Progress Notes (Signed)
Subjective:    Patient ID: [name], male    DOB: [birth year], 52 y.o.   MRN: [id number]  HPI He is here for complete examination. He is doing quite well after having a hip replacement and is now walking regularly. He is now under 300 pounds and feels quite good about this. He does have a past history of TIA and repair of a PFO. He continues to do well with his CPAP. He recently switched to a different PPI to help with his underlying reflux disease. He is on a research protocol with this plaque psoriasis and is doing quite well on this. Continues on his blood pressure medications. His work and home life are going quite well. He has 2 sons in college.   Review of Systems Negative except as above    Objective:   Physical Exam BP 130/80  Pulse 65  Ht 5' 9.5" (1.765 m)  Wt 296 lb (134.265 kg)  BMI 43.08 kg/m2  SpO2 98%  General Appearance:    Alert, cooperative, no distress, appears stated age  Head:    Normocephalic, without obvious abnormality, atraumatic  Eyes:    PERRL, conjunctiva/corneas clear, EOM's intact, fundi    benign  Ears:    Normal TM's and external ear canals  Nose:   Nares normal, mucosa normal, no drainage or sinus   tenderness  Throat:   Lips, mucosa, and tongue normal; teeth and gums normal  Neck:   Supple, no lymphadenopathy;  thyroid:  no   enlargement/tenderness/nodules; no carotid   bruit or JVD  Back:    Spine nontender, no curvature, ROM normal, no CVA     tenderness  Lungs:     Clear to auscultation bilaterally without wheezes, rales or     ronchi; respirations unlabored  Chest Wall:    No tenderness or deformity   Heart:    Regular rate and rhythm, S1 and S2 normal, no murmur, rub   or gallop  Breast Exam:    No chest wall tenderness, masses or gynecomastia  Abdomen:     Soft, non-tender, nondistended, normoactive bowel sounds,    no masses, no hepatosplenomegaly  Genitalia:   deferred   Rectal:    Normal sphincter tone, no masses or tenderness;  guaiac positive stool.  Prostate smooth, no nodules, not enlarged.  Extremities:   No clubbing, cyanosis or edema  Pulses:   2+ and symmetric all extremities  Skin:   Skin color, texture, turgor normal, no rashes or lesions  Lymph nodes:   Cervical, supraclavicular, and axillary nodes normal  Neurologic:   CNII-XII intact, normal strength, sensation and gait; reflexes 2+ and symmetric throughout          Psych:   Normal mood, affect, hygiene and grooming.           Assessment & Plan:   1. Need for prophylactic vaccination and inoculation against influenza  influenza  inactive virus vaccine (FLUZONE/FLUARIX) injection 0.5 mL  2. Hx-TIA (transient ischemic attack)  clopidogrel (PLAVIX) 75 MG tablet  3. SLEEP APNEA, OBSTRUCTIVE    4. GERD (gastroesophageal reflux disease)    5. Obesity, Class III, BMI 40-49.9 (morbid obesity)    6. Plaque psoriasis    7. Routine general medical examination at a health care facility  POCT Urinalysis Dipstick, Hemoccult - 1 Card (office), Lipid panel, CBC with Differential, Comprehensive metabolic panel  8. Hypertension  diltiazem (CARDIZEM CD) 240 MG 24 hr capsule, benazepril (LOTENSIN) 40 MG tablet  9.  Stool guaiac positive  Hemoccult - 1 Card (office), Ambulatory referral to Gastroenterology   encouraged him to continue with his active lifestyle and weight reduction. Discussed possibly retesting him for sleep apnea after he has lost a significant amount of weight. He also discussed a local on psoriasis once he finishes the protocol. Flu shot given with discussion of risks and benefits.

## 2012-09-05 NOTE — Progress Notes (Signed)
PT HAS APT. WITH DR.STARK JAN. 13 2014 AT 9:30 am pt is aware

## 2012-09-12 ENCOUNTER — Ambulatory Visit (INDEPENDENT_AMBULATORY_CARE_PROVIDER_SITE_OTHER): Payer: 59 | Admitting: Gastroenterology

## 2012-09-12 ENCOUNTER — Encounter: Payer: Self-pay | Admitting: Gastroenterology

## 2012-09-12 VITALS — BP 132/84 | HR 76 | Ht 69.5 in | Wt 302.5 lb

## 2012-09-12 DIAGNOSIS — K219 Gastro-esophageal reflux disease without esophagitis: Secondary | ICD-10-CM

## 2012-09-12 DIAGNOSIS — R195 Other fecal abnormalities: Secondary | ICD-10-CM

## 2012-09-12 MED ORDER — PEG-KCL-NACL-NASULF-NA ASC-C 100 G PO SOLR: 1.0000 | Freq: Once | ORAL | Status: DC

## 2012-09-12 NOTE — Patient Instructions (Addendum)
You have been scheduled for an endoscopy and colonoscopy with propofol. Please follow the written instructions given to you at your visit today. Please pick up your prep at the pharmacy within the next 1-3 days. If you use inhalers (even only as needed) or a CPAP machine, please bring them with you on the day of your procedure.  You will be contacted by our office prior to your procedure for directions on holding your Plavix.  If you do not hear from our office 1 week prior to your scheduled procedure, please call [phone] to discuss.   cc: Sharlot Gowda, MD

## 2012-09-12 NOTE — Progress Notes (Signed)
History of Present Illness: This is a 52 year old male was found to have Hemoccult positive stool and exam by Dr. Susann Givens. He noted change in bowel habits this fall with treatment diarrhea and loose stools. About one month ago his bowel habits returned to their normal pattern. He has had chronic GERD with symptoms well controlled on daily PPI. He previously underwent colonoscopy in December 2010 showing only mild sigmoid colon diverticulosis. Denies weight loss, abdominal pain, constipation, diarrhea, change in stool caliber, melena, hematochezia, nausea, vomiting, dysphagia, reflux symptoms, chest pain.  Review of Systems: Pertinent positive and negative review of systems were noted in the above HPI section. All other review of systems were otherwise negative.  Current Medications, Allergies, Past Medical History, Past Surgical History, Family History and Social History were reviewed in [employer] record.  Physical Exam: General: Well developed , well nourished, no acute distress Head: Normocephalic and atraumatic Eyes:  sclerae anicteric, EOMI Ears: Normal auditory acuity Mouth: No deformity or lesions Neck: Supple, no masses or thyromegaly Lungs: Clear throughout to auscultation Heart: Regular rate and rhythm; no murmurs, rubs or bruits Abdomen: Soft, non tender and non distended. No masses, hepatosplenomegaly or hernias noted. Normal Bowel sounds Rectal: No lesions, Hemoccult negative soft brown stool the vault Musculoskeletal: Symmetrical with no gross deformities  Skin: No lesions on visible extremities Pulses:  Normal pulses noted Extremities: No clubbing, cyanosis, edema or deformities noted Neurological: Alert oriented x 4, grossly nonfocal Cervical Nodes:  No significant cervical adenopathy Inguinal Nodes: No significant inguinal adenopathy Psychological:  Alert and cooperative. Normal mood and affect  Assessment and Recommendations:  1.  Hemoccult-positive stool. Chronic GERD. Change in bowel habits, resolved. He is Hemoccult negative today. Rule out colorectal lesions and upper gastrointestinal tract lesions leading to occult blood loss. Rule out Barrett's esophagus. Continue standard antireflux measures and a daily PPI. The risks, benefits, and alternatives to colonoscopy with possible biopsy and possible polypectomy were discussed with the patient and they consent to proceed. The risks, benefits, and alternatives to endoscopy with possible biopsy and possible dilation were discussed with the patient and they consent to proceed. The risks, benefits and alternatives to a 5 day hold Plavix were discussed with the patient and he consents to the. Will obtain clearance from Dr. Allyson Sabal.

## 2012-09-13 ENCOUNTER — Encounter: Payer: Self-pay | Admitting: Gastroenterology

## 2012-09-14 ENCOUNTER — Telehealth: Payer: Self-pay

## 2012-09-14 NOTE — Telephone Encounter (Signed)
Received fax from Dr. Hazle Coca office that patient can hold Plavix 5 days before procedure. Pt notified and verbalized understanding. Letter to be scanned into Epic.

## 2012-09-28 ENCOUNTER — Encounter: Payer: Self-pay | Admitting: Gastroenterology

## 2012-09-28 ENCOUNTER — Ambulatory Visit (AMBULATORY_SURGERY_CENTER): Payer: 59 | Admitting: Gastroenterology

## 2012-09-28 VITALS — BP 163/98 | HR 61 | Temp 98.6°F | Resp 16 | Ht 69.0 in | Wt 302.0 lb

## 2012-09-28 DIAGNOSIS — D126 Benign neoplasm of colon, unspecified: Secondary | ICD-10-CM

## 2012-09-28 DIAGNOSIS — R195 Other fecal abnormalities: Secondary | ICD-10-CM

## 2012-09-28 DIAGNOSIS — K229 Disease of esophagus, unspecified: Secondary | ICD-10-CM

## 2012-09-28 DIAGNOSIS — R197 Diarrhea, unspecified: Secondary | ICD-10-CM

## 2012-09-28 DIAGNOSIS — K635 Polyp of colon: Secondary | ICD-10-CM

## 2012-09-28 DIAGNOSIS — K219 Gastro-esophageal reflux disease without esophagitis: Secondary | ICD-10-CM

## 2012-09-28 DIAGNOSIS — K209 Esophagitis, unspecified without bleeding: Secondary | ICD-10-CM

## 2012-09-28 MED ORDER — SODIUM CHLORIDE 0.9 % IV SOLN: 500.0000 mL | INTRAVENOUS | Status: DC

## 2012-09-28 NOTE — Op Note (Signed)
[facility] [address] [place], [address]   ENDOSCOPY PROCEDURE REPORT  PATIENT: William, Delgado  MR#: [id number] BIRTHDATE: 05/10/1961 , 51  yrs. old GENDER: Male ENDOSCOPIST: Meryl Dare, MD, [facility] PROCEDURE DATE:  09/28/2012 PROCEDURE:  EGD w/ biopsy ASA CLASS:     Class II INDICATIONS:  Follow up of esophageal reflux.  Heme positive stool.  MEDICATIONS: residual sedation effect present from prior procedure, MAC sedation, administered by CRNA, propofol (Diprivan) 150mg  IV TOPICAL ANESTHETIC: none DESCRIPTION OF PROCEDURE: After the risks benefits and alternatives of the procedure were thoroughly explained, informed consent was obtained.  The LB GIF-H180 [id number] endoscope was introduced through the mouth and advanced to the second portion of the duodenum without limitations.  The instrument was slowly withdrawn as the mucosa was fully examined.    ESOPHAGUS: An irregular Z-line was observed 40 cm from the incisors. Multiple biopsies were performed.   The esophagus was otherwise appeared normal. STOMACH: The mucosa and folds of the stomach appeared normal. DUODENUM: The duodenal mucosa showed no abnormalities in the bulb and second portion of the duodenum.  Retroflexed views revealed . The scope was then withdrawn from the patient and the procedure completed.  COMPLICATIONS: There were no complications.  ENDOSCOPIC IMPRESSION: 1.   Irregular Z-line; multiple biopsies 2.   The EGD was otherwise appeared normal  RECOMMENDATIONS: 1.  Anti-reflux regimen 2.  Await pathology results 3.  Continue PPI    eSigned:  Meryl Dare, MD, [facility] 09/28/2012 2:35 PM

## 2012-09-28 NOTE — Progress Notes (Signed)
Called to room to assist during endoscopic procedure.  Patient ID and intended procedure confirmed with present staff. Received instructions for my participation in the procedure from the performing physician.  

## 2012-09-28 NOTE — Op Note (Signed)
[facility] [address] [place], [address]   COLONOSCOPY PROCEDURE REPORT  PATIENT: [name], [name]  MR#: [id number] BIRTHDATE: August 11, 1961 , 51  yrs. old GENDER: Male ENDOSCOPIST: Meryl Dare, MD, [facility] PROCEDURE DATE:  09/28/2012 PROCEDURE:   Colonoscopy with snare polypectomy and Colonoscopy with biopsy ASA CLASS:   Class II INDICATIONS:heme-positive stool and unexplained diarrhea. MEDICATIONS: MAC sedation, administered by CRNA and propofol (Diprivan) 300mg  IV DESCRIPTION OF PROCEDURE:   After the risks benefits and alternatives of the procedure were thoroughly explained, informed consent was obtained.  A digital rectal exam revealed no abnormalities of the rectum.   The LB CF-H180AL [id number]  endoscope was introduced through the anus and advanced to the cecum, which was identified by both the appendix and ileocecal valve. No adverse events experienced.   The quality of the prep was good, using MoviPrep  The instrument was then slowly withdrawn as the colon was fully examined.   COLON FINDINGS: A sessile polyp measuring 6 mm in size was found at the cecum.  A polypectomy was performed using snare cautery.  The resection was complete and the polyp tissue was completely retrieved.   The colon was otherwise normal.  There was no diverticulosis, inflammation, polyps or cancers unless previously stated.  Random biopsies obtained throughout the colon. Retroflexed views revealed small internal hemorrhoids. The time to cecum=2 minutes 03 seconds.  Withdrawal time=11 minutes 30 seconds.  The scope was withdrawn and the procedure completed.  COMPLICATIONS: There were no complications.  ENDOSCOPIC IMPRESSION: 1.   Sessile polyp measuring 6 mm at the cecum; polypectomy performed using snare cautery 2.   Small internal hemorrhoids  RECOMMENDATIONS: 1.  Repeat colonoscopy in 5 years if polyp adenomatous; otherwise 10 years  eSigned:  Meryl Dare, MD,  [facility] 09/28/2012 2:17 PM   cc: Sharlot Gowda, MD

## 2012-09-28 NOTE — Progress Notes (Signed)
Patient did not experience any of the following events: a burn prior to discharge; a fall within the facility; wrong site/side/patient/procedure/implant event; or a hospital transfer or hospital admission upon discharge from the facility. (G8907) Patient did not have preoperative order for IV antibiotic SSI prophylaxis. (G8918) Patient did not have preoperative order for IV antibiotic SSI prophylaxis. (G8918)  

## 2012-09-28 NOTE — Patient Instructions (Addendum)
RESTART PLAVIX TODAY!  YOU HAD AN ENDOSCOPIC PROCEDURE TODAY AT THE [facility]: Refer to the procedure report that was given to you for any specific questions about what was found during the examination.  If the procedure report does not answer your questions, please call your gastroenterologist to clarify.  If you requested that your care partner not be given the details of your procedure findings, then the procedure report has been included in a sealed envelope for you to review at your convenience later.  YOU SHOULD EXPECT: Some feelings of bloating in the abdomen. Passage of more gas than usual.  Walking can help get rid of the air that was put into your GI tract during the procedure and reduce the bloating. If you had a lower endoscopy (such as a colonoscopy or flexible sigmoidoscopy) you may notice spotting of blood in your stool or on the toilet paper. If you underwent a bowel prep for your procedure, then you may not have a normal bowel movement for a few days.  DIET: Your first meal following the procedure should be a light meal and then it is ok to progress to your normal diet.  A half-sandwich or bowl of soup is an example of a good first meal.  Heavy or fried foods are harder to digest and may make you feel nauseous or bloated.  Likewise meals heavy in dairy and vegetables can cause extra gas to form and this can also increase the bloating.  Drink plenty of fluids but you should avoid alcoholic beverages for 24 hours.  ACTIVITY: Your care partner should take you home directly after the procedure.  You should plan to take it easy, moving slowly for the rest of the day.  You can resume normal activity the day after the procedure however you should NOT DRIVE or use heavy machinery for 24 hours (because of the sedation medicines used during the test).    SYMPTOMS TO REPORT IMMEDIATELY: A gastroenterologist can be reached at any hour.  During normal business hours, 8:30 AM to 5:00 PM  Monday through Friday, call [phone].  After hours and on weekends, please call the GI answering service at [phone] who will take a message and have the physician on call contact you.   Following lower endoscopy (colonoscopy or flexible sigmoidoscopy):  Excessive amounts of blood in the stool  Significant tenderness or worsening of abdominal pains  Swelling of the abdomen that is new, acute  Fever of 100F or higher  Following upper endoscopy (EGD)  Vomiting of blood or coffee ground material  New chest pain or pain under the shoulder blades  Painful or persistently difficult swallowing  New shortness of breath  Fever of 100F or higher  Black, tarry-looking stools  FOLLOW UP: If any biopsies were taken you will be contacted by phone or by letter within the next 1-3 weeks.  Call your gastroenterologist if you have not heard about the biopsies in 3 weeks.  Our staff will call the home number listed on your records the next business day following your procedure to check on you and address any questions or concerns that you may have at that time regarding the information given to you following your procedure. This is a courtesy call and so if there is no answer at the home number and we have not heard from you through the emergency physician on call, we will assume that you have returned to your regular daily activities without incident.  SIGNATURES/CONFIDENTIALITY:  You and/or your care partner have signed paperwork which will be entered into your electronic medical record.  These signatures attest to the fact that that the information above on your After Visit Summary has been reviewed and is understood.  Full responsibility of the confidentiality of this discharge information lies with you and/or your care-partner.

## 2012-09-29 ENCOUNTER — Telehealth: Payer: Self-pay | Admitting: *Deleted

## 2012-09-29 NOTE — Telephone Encounter (Signed)
Message left

## 2012-10-03 ENCOUNTER — Encounter: Payer: Self-pay | Admitting: Gastroenterology

## 2012-12-07 ENCOUNTER — Ambulatory Visit (INDEPENDENT_AMBULATORY_CARE_PROVIDER_SITE_OTHER): Payer: Self-pay | Admitting: Neurology

## 2012-12-07 DIAGNOSIS — I6350 Cerebral infarction due to unspecified occlusion or stenosis of unspecified cerebral artery: Secondary | ICD-10-CM

## 2012-12-07 DIAGNOSIS — I639 Cerebral infarction, unspecified: Secondary | ICD-10-CM

## 2012-12-07 NOTE — Progress Notes (Signed)
Participant was in the office today for his 4th. Annual IRIS study visit. MMSE was completed successfully without any problems. Blood was drawn and shipped.  Participant to continue with follow-up per protocol.

## 2013-02-16 ENCOUNTER — Ambulatory Visit (INDEPENDENT_AMBULATORY_CARE_PROVIDER_SITE_OTHER): Payer: 59 | Admitting: Family Medicine

## 2013-02-16 ENCOUNTER — Encounter: Payer: Self-pay | Admitting: Family Medicine

## 2013-02-16 VITALS — BP 120/90 | HR 80 | Wt 297.0 lb

## 2013-02-16 DIAGNOSIS — R7309 Other abnormal glucose: Secondary | ICD-10-CM

## 2013-02-16 LAB — POCT URINALYSIS DIPSTICK
Glucose, UA: NEGATIVE
Nitrite, UA: NEGATIVE
Urobilinogen, UA: NEGATIVE

## 2013-02-16 LAB — POCT GLYCOSYLATED HEMOGLOBIN (HGB A1C): Hemoglobin A1C: 5.4

## 2013-02-16 NOTE — Progress Notes (Signed)
  Subjective:    Patient ID: [name], male    DOB: 11/07/1960, 52 y.o.   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 He has had police 1 with an elevated fasting blood sugar and is here for consultation concerning this. He is much more physically active and in general feels much better since he was also on a research protocol for treatment of the psoriasis which also help with his arthritis-type symptoms.   Review of Systems     Objective:   Physical Exam Alert and in no distress. Exam of his skin shows no lesions. Hemoglobin A1c is 5.4.       Assessment & Plan:  Abnormal glucose - Plan: POCT urinalysis dipstick, POCT glycosylated hemoglobin (Hb A1C) I discussed the fact that at this time there is no evidence of diabetes but again encouraged him to make continued lifestyle changes to help reduce his weight. Discussed weight in regard to sleep apnea, diabetes, hypertension etc. He will continue to make changes in that regard.

## 2013-04-04 ENCOUNTER — Telehealth: Payer: Self-pay | Admitting: Family Medicine

## 2013-04-04 NOTE — Telephone Encounter (Signed)
PT AT [facility] and they need last ov and last labs faxed to [facility] [phone]

## 2013-05-02 ENCOUNTER — Encounter: Payer: Self-pay | Admitting: Family Medicine

## 2013-05-02 ENCOUNTER — Ambulatory Visit (INDEPENDENT_AMBULATORY_CARE_PROVIDER_SITE_OTHER): Payer: 59 | Admitting: Family Medicine

## 2013-05-02 ENCOUNTER — Ambulatory Visit
Admission: RE | Admit: 2013-05-02 | Discharge: 2013-05-02 | Disposition: A | Discharge status: Home / Self Care | Payer: 59 | Source: Ambulatory Visit | Attending: Family Medicine | Admitting: Family Medicine

## 2013-05-02 ENCOUNTER — Other Ambulatory Visit: Payer: Self-pay | Admitting: Family Medicine

## 2013-05-02 VITALS — BP 120/70 | HR 60 | Wt 304.0 lb

## 2013-05-02 DIAGNOSIS — M25561 Pain in right knee: Secondary | ICD-10-CM

## 2013-05-02 DIAGNOSIS — M25562 Pain in left knee: Secondary | ICD-10-CM

## 2013-05-02 DIAGNOSIS — L400 Psoriasis vulgaris: Secondary | ICD-10-CM

## 2013-05-02 DIAGNOSIS — L408 Other psoriasis: Secondary | ICD-10-CM

## 2013-05-02 DIAGNOSIS — Z23 Encounter for immunization: Secondary | ICD-10-CM

## 2013-05-02 DIAGNOSIS — M25569 Pain in unspecified knee: Secondary | ICD-10-CM

## 2013-05-02 MED ORDER — OXYCODONE-ACETAMINOPHEN 5-325 MG PO TABS: 1.0000 | ORAL_TABLET | Freq: Three times a day (TID) | ORAL | Status: DC | PRN

## 2013-05-02 NOTE — Patient Instructions (Addendum)
Take 2 Aleve twice per day to see if we can calm the knee down. If it does not or you get worse then they'll move forward with an x-ray and then MRI Do not use the oxycodone while driving or around machinery

## 2013-05-02 NOTE — Progress Notes (Signed)
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 Discussed the benefits of weight reduction. ______

## 2013-05-02 NOTE — Progress Notes (Signed)
  Subjective:    Patient ID: [name], male    DOB: Nov 22, 1960, 52 y.o.   MRN: [id number]  HPI He is here for evaluation of left knee pain. He has been active the last week or so. Today while sitting on the he moved his left knee and felt a popping sensation like a bowstring laterally. Since then he has had pain with any physical activity. He is now taking Stelera for treatment of psoriasis. He also states that he has had a long history of right knee pain interfering with his functional capacity. He states he can no longer run because this. He has not mentioned this in the past and there are no x-rays.  Review of Systems     Objective:   Physical Exam No effusion noted in the right knee with good motion. Left knee exam does show a small effusion with tenderness to the posterolateral joint line. McMurray's testing causes pain. Negative anterior drawer other ligaments intact.       Assessment & Plan:  Left knee pain - Plan: DG Knee 1-2 Views Left  Plaque psoriasis - Plan: DG Knee 1-2 Views Left  Right knee pain  recommend Aleve 2 pills twice per day. He was also given oxycodone. Cautioned him on the use of this medication while driving and around machinery. If he does not improve, an MRI will be ordered. Discussed his pain in relation to the degenerative changes and in regard to his underlying psoriasis. Because he is having pain in both knees, my threshold for more aggressive care will be very low.

## 2013-09-25 ENCOUNTER — Other Ambulatory Visit: Payer: Self-pay | Admitting: Family Medicine

## 2013-10-09 ENCOUNTER — Other Ambulatory Visit: Payer: Self-pay | Admitting: Family Medicine

## 2013-10-23 ENCOUNTER — Other Ambulatory Visit: Payer: Self-pay | Admitting: Family Medicine

## 2013-10-23 ENCOUNTER — Ambulatory Visit (INDEPENDENT_AMBULATORY_CARE_PROVIDER_SITE_OTHER): Payer: 59 | Admitting: Family Medicine

## 2013-10-23 ENCOUNTER — Encounter: Payer: Self-pay | Admitting: Family Medicine

## 2013-10-23 VITALS — BP 130/90 | HR 64 | Ht 69.0 in | Wt 304.0 lb

## 2013-10-23 DIAGNOSIS — I10 Essential (primary) hypertension: Secondary | ICD-10-CM

## 2013-10-23 DIAGNOSIS — L408 Other psoriasis: Secondary | ICD-10-CM

## 2013-10-23 DIAGNOSIS — G4733 Obstructive sleep apnea (adult) (pediatric): Secondary | ICD-10-CM

## 2013-10-23 DIAGNOSIS — Z8673 Personal history of transient ischemic attack (TIA), and cerebral infarction without residual deficits: Secondary | ICD-10-CM

## 2013-10-23 DIAGNOSIS — Z0000 Encounter for general adult medical examination without abnormal findings: Secondary | ICD-10-CM

## 2013-10-23 DIAGNOSIS — K219 Gastro-esophageal reflux disease without esophagitis: Secondary | ICD-10-CM

## 2013-10-23 DIAGNOSIS — L400 Psoriasis vulgaris: Secondary | ICD-10-CM

## 2013-10-23 LAB — COMPREHENSIVE METABOLIC PANEL
ALBUMIN: 4.6 g/dL (ref 3.5–5.2)
ALT: 40 U/L (ref 0–53)
AST: 29 U/L (ref 0–37)
Alkaline Phosphatase: 62 U/L (ref 39–117)
BUN: 16 mg/dL (ref 6–23)
CALCIUM: 9.4 mg/dL (ref 8.4–10.5)
CHLORIDE: 95 meq/L — AB (ref 96–112)
CO2: 29 mEq/L (ref 19–32)
Creat: 0.95 mg/dL (ref 0.50–1.35)
GLUCOSE: 164 mg/dL — AB (ref 70–99)
POTASSIUM: 4.1 meq/L (ref 3.5–5.3)
SODIUM: 134 meq/L — AB (ref 135–145)
TOTAL PROTEIN: 7.3 g/dL (ref 6.0–8.3)
Total Bilirubin: 1.3 mg/dL — ABNORMAL HIGH (ref 0.2–1.2)

## 2013-10-23 LAB — POCT URINALYSIS DIPSTICK
Bilirubin, UA: NEGATIVE
Blood, UA: NEGATIVE
GLUCOSE UA: NEGATIVE
Ketones, UA: NEGATIVE
Leukocytes, UA: NEGATIVE
NITRITE UA: NEGATIVE
UROBILINOGEN UA: NEGATIVE
pH, UA: 7

## 2013-10-23 LAB — CBC WITH DIFFERENTIAL/PLATELET
Basophils Absolute: 0.1 10*3/uL (ref 0.0–0.1)
Basophils Relative: 1 % (ref 0–1)
EOS ABS: 0.3 10*3/uL (ref 0.0–0.7)
Eosinophils Relative: 4 % (ref 0–5)
HCT: 50.5 % (ref 39.0–52.0)
HEMOGLOBIN: 18.2 g/dL — AB (ref 13.0–17.0)
LYMPHS ABS: 3.1 10*3/uL (ref 0.7–4.0)
LYMPHS PCT: 37 % (ref 12–46)
MCH: 32 pg (ref 26.0–34.0)
MCHC: 36 g/dL (ref 30.0–36.0)
MCV: 88.9 fL (ref 78.0–100.0)
Monocytes Absolute: 0.7 10*3/uL (ref 0.1–1.0)
Monocytes Relative: 8 % (ref 3–12)
NEUTROS PCT: 50 % (ref 43–77)
Neutro Abs: 4.3 10*3/uL (ref 1.7–7.7)
PLATELETS: 204 10*3/uL (ref 150–400)
RBC: 5.68 MIL/uL (ref 4.22–5.81)
RDW: 14.2 % (ref 11.5–15.5)
WBC: 8.5 10*3/uL (ref 4.0–10.5)

## 2013-10-23 LAB — LIPID PANEL
CHOLESTEROL: 182 mg/dL (ref 0–200)
HDL: 38 mg/dL — ABNORMAL LOW (ref >39)
LDL Cholesterol: 68 mg/dL (ref 0–99)
TRIGLYCERIDES: 380 mg/dL — AB (ref <150)
Total CHOL/HDL Ratio: 4.8 Ratio
VLDL: 76 mg/dL — ABNORMAL HIGH (ref 0–40)

## 2013-10-23 MED ORDER — CLOPIDOGREL BISULFATE 75 MG PO TABS: ORAL_TABLET | ORAL | Status: DC

## 2013-10-23 MED ORDER — PANTOPRAZOLE SODIUM 40 MG PO TBEC: DELAYED_RELEASE_TABLET | ORAL | Status: DC

## 2013-10-23 MED ORDER — BENAZEPRIL HCL 40 MG PO TABS: 40.0000 mg | ORAL_TABLET | Freq: Every day | ORAL | Status: DC

## 2013-10-23 MED ORDER — DILTIAZEM HCL ER COATED BEADS 240 MG PO CP24: ORAL_CAPSULE | ORAL | Status: DC

## 2013-10-23 NOTE — Progress Notes (Signed)
   Subjective:    Patient ID: [name], male    DOB: [birth year], 53 y.o.   MRN: [id number]  HPI He is here for a complete examination. He states that life in general going quite well for him. He uses his CPAP and gets good results with this although he has not had a read out in quite sometime. His reflux is under good control. He has had an endoscopy to evaluate this. He had previous history of TIA and was found to have a PFO that was close. He continues on Plavix and has had no neurologic symptoms. He continues to street his plaque psoriasis and presently is on a DMARD and followed by dermatology. He exercises regularly. His eating habits are unchanged. Work and home life are going quite well. His father is now in assisted living area at he has no other concerns or complaints.   Review of Systems  All other systems reviewed and are negative.       Objective:   Physical Exam BP 130/90  Pulse 64  Ht 5\' 9"  (1.753 m)  Wt 304 lb (137.893 kg)  BMI 44.87 kg/m2  General Appearance:    Alert, cooperative, no distress, appears stated age  Head:    Normocephalic, without obvious abnormality, atraumatic  Eyes:    PERRL, conjunctiva/corneas clear, EOM's intact, fundi    benign  Ears:    Normal TM's and external ear canals  Nose:   Nares normal, mucosa normal, no drainage or sinus   tenderness  Throat:   Lips, mucosa, and tongue normal; teeth and gums normal  Neck:   Supple, no lymphadenopathy;  thyroid:  no   enlargement/tenderness/nodules; no carotid   bruit or JVD  Back:    Spine nontender, no curvature, ROM normal, no CVA     tenderness  Lungs:     Clear to auscultation bilaterally without wheezes, rales or     ronchi; respirations unlabored  Chest Wall:    No tenderness or deformity   Heart:    Regular rate and rhythm, S1 and S2 normal, no murmur, rub   or gallop  Breast Exam:    No chest wall tenderness, masses or gynecomastia  Abdomen:     Soft, non-tender, nondistended,  normoactive bowel sounds,    no masses, no hepatosplenomegaly  Genitalia:  deferred  Rectal:  deferred  Extremities:   No clubbing, cyanosis or edema  Pulses:   2+ and symmetric all extremities  Skin:   Skin color, texture, turgor normal, no rashes or lesions  Lymph nodes:   Cervical, supraclavicular, and axillary nodes normal  Neurologic:   CNII-XII intact, normal strength, sensation and gait; reflexes 2+ and symmetric throughout          Psych:   Normal mood, affect, hygiene and grooming.          Assessment & Plan:  Routine general medical examination at a health care facility - Plan: Urinalysis Dipstick, CBC with Differential, Comprehensive metabolic panel, Lipid panel  GERD (gastroesophageal reflux disease)  Hx-TIA (transient ischemic attack) - Plan: CBC with Differential, Comprehensive metabolic panel, Lipid panel  Obesity, Class III, BMI 40-49.9 (morbid obesity) - Plan: CBC with Differential, Comprehensive metabolic panel, Lipid panel  Plaque psoriasis  SLEEP APNEA, OBSTRUCTIVE  Hypertension  encouraged him to continue with his active lifestyle. Did mention of getting help from nutrition concerning his eating habits since he does seem to stay fairly physically active. CPAP readout.

## 2013-10-24 LAB — HEMOGLOBIN A1C
Hgb A1c MFr Bld: 6.6 % — ABNORMAL HIGH (ref <5.7)
Mean Plasma Glucose: 143 mg/dL — ABNORMAL HIGH (ref <117)

## 2013-10-27 ENCOUNTER — Telehealth: Payer: Self-pay | Admitting: Internal Medicine

## 2013-10-27 NOTE — Telephone Encounter (Signed)
Pt wants to know if he can get a new cpap machine from [facility]. His is pretty old. If so we have to write an order for Cpap & supplies and also send the sleep study report and fax it to [place] @ [phone]

## 2013-10-27 NOTE — Telephone Encounter (Signed)
Go ahead and send this off

## 2013-10-27 NOTE — Telephone Encounter (Signed)
Faxed over order and sleep study to [facility]

## 2013-10-31 ENCOUNTER — Encounter: Payer: Self-pay | Admitting: Family Medicine

## 2013-10-31 ENCOUNTER — Ambulatory Visit (INDEPENDENT_AMBULATORY_CARE_PROVIDER_SITE_OTHER): Payer: 59 | Admitting: Family Medicine

## 2013-10-31 VITALS — BP 140/80 | HR 74 | Wt 300.0 lb

## 2013-10-31 DIAGNOSIS — E119 Type 2 diabetes mellitus without complications: Secondary | ICD-10-CM | POA: Insufficient documentation

## 2013-10-31 NOTE — Progress Notes (Signed)
   Subjective:    Patient ID: [name], male    DOB: Jan 20, 1961, 53 y.o.   MRN: [id number]  HPI He is here for consult concerning recent diagnosis of diabetes. His hemoglobin A1c is 6.6. In the past he did have elevated A1c but not to the threshold level. He is well aware of the fact that he needs to make permanent lifestyle changes in regard to diet and exercise and admits so far he has been unable to do this. Discussed possibly referring him to diabetes and nutrition however she would like to hold off on this and see what he can do on his own. Discussed medications with him and explained that at this point there is no need to treat him with any diabetes-related medications but diet, exercise, proper eating are important. He will return here in 3 months for a recheck.   Review of Systems     Objective:   Physical Exam Alert and in no distress otherwise not examined       Assessment & Plan:  Diabetes mellitus, new onset  Obesity, Class III, BMI 40-49.9 (morbid obesity)

## 2013-11-15 ENCOUNTER — Encounter: Payer: Self-pay | Admitting: Family Medicine

## 2014-01-31 ENCOUNTER — Ambulatory Visit (INDEPENDENT_AMBULATORY_CARE_PROVIDER_SITE_OTHER): Payer: 59 | Admitting: Family Medicine

## 2014-01-31 VITALS — BP 130/82 | HR 60 | Wt 286.0 lb

## 2014-01-31 DIAGNOSIS — Z8673 Personal history of transient ischemic attack (TIA), and cerebral infarction without residual deficits: Secondary | ICD-10-CM

## 2014-01-31 DIAGNOSIS — I10 Essential (primary) hypertension: Secondary | ICD-10-CM

## 2014-01-31 DIAGNOSIS — E119 Type 2 diabetes mellitus without complications: Secondary | ICD-10-CM

## 2014-01-31 DIAGNOSIS — L408 Other psoriasis: Secondary | ICD-10-CM

## 2014-01-31 DIAGNOSIS — L400 Psoriasis vulgaris: Secondary | ICD-10-CM

## 2014-01-31 DIAGNOSIS — E66813 Obesity, class 3: Secondary | ICD-10-CM

## 2014-01-31 DIAGNOSIS — G4733 Obstructive sleep apnea (adult) (pediatric): Secondary | ICD-10-CM

## 2014-01-31 LAB — POCT GLYCOSYLATED HEMOGLOBIN (HGB A1C): HEMOGLOBIN A1C: 9.8

## 2014-01-31 NOTE — Patient Instructions (Addendum)
Check your blood sugar daily he did before a meal or 2 hours after a meal. The numbers are usually in the low 100s before you eat and nothing above 180 Go to the American diabetes association website or Familydoctor.org to get information concerning diabetes

## 2014-01-31 NOTE — Progress Notes (Signed)
   Subjective:    Patient ID: [name], male    DOB: 1960/12/22, 53 y.o.   MRN: [id number]  HPI He is here for recheck. Since his last visit he has made changes in his lifestyle in regard to physical activity. He has lost 19 pounds and notes less aches and pains. He continues on CPAP and is doing quite well on this. He has not had any more neurologic symptoms. He is doing well on his injections for plaque psoriasis.   Review of Systems     Objective:   Physical Exam Alert and in no distress. HEENT globin A1c is 9.8.       Assessment & Plan:  Diabetes mellitus, new onset - Plan: HgB A1c, Amb Referral to Nutrition and Diabetic E, Hemoglobin A1c  Hx-TIA (transient ischemic attack)  SLEEP APNEA, OBSTRUCTIVE  Obesity, Class III, BMI 40-49.9 (morbid obesity)  Plaque psoriasis  Hypertension  he expressed concern over the Actos he of the hemoglobin A1c and I will therefore order a blood study. Discussed the fact that he is doing a good Job taking care of himself, unfortunately the diabetes is most likely getting worse. I explained the natural history of diabetes. I willrefer him to nutritionist and diabetes for education. Will also start having him check his blood sugars. Check here in 3 months. One half hour spent discussing all this with him.

## 2014-02-01 ENCOUNTER — Telehealth: Payer: Self-pay | Admitting: Family Medicine

## 2014-02-01 LAB — HEMOGLOBIN A1C
Hgb A1c MFr Bld: 10 % — ABNORMAL HIGH (ref <5.7)
Mean Plasma Glucose: 240 mg/dL — ABNORMAL HIGH (ref <117)

## 2014-02-01 MED ORDER — METFORMIN HCL ER (MOD) 500 MG PO TB24: 500.0000 mg | ORAL_TABLET | Freq: Two times a day (BID) | ORAL | Status: DC

## 2014-02-01 NOTE — Telephone Encounter (Signed)
Pharmacy called, [facility], and stated that pt's Glumetza has a copay of over $100.00. Pharmacist, Brayton Layman, is requesting to change to Metformin XR which has a copay of $4.00. Please advise Monica at pharmacy. Number is [phone].

## 2014-02-01 NOTE — Telephone Encounter (Signed)
Give him a generic extended release metformin 500 twice a day

## 2014-02-01 NOTE — Addendum Note (Signed)
Addended by: Denita Lung on: 02/01/2014 08:36 AM   Modules accepted: Orders

## 2014-02-01 NOTE — Telephone Encounter (Signed)
Taken care of

## 2014-02-02 ENCOUNTER — Other Ambulatory Visit: Payer: Self-pay

## 2014-02-02 ENCOUNTER — Encounter: Payer: Self-pay | Admitting: Family Medicine

## 2014-02-02 MED ORDER — GLUCOSE BLOOD VI STRP: ORAL_STRIP | Status: DC

## 2014-02-02 MED ORDER — ONETOUCH DELICA LANCETS FINE MISC: 2.0000 | Freq: Two times a day (BID) | Status: DC

## 2014-03-10 ENCOUNTER — Encounter: Payer: 59 | Attending: Family Medicine

## 2014-03-10 VITALS — Ht 70.0 in | Wt 278.6 lb

## 2014-03-10 DIAGNOSIS — Z713 Dietary counseling and surveillance: Secondary | ICD-10-CM | POA: Insufficient documentation

## 2014-03-10 DIAGNOSIS — E119 Type 2 diabetes mellitus without complications: Secondary | ICD-10-CM | POA: Diagnosis present

## 2014-03-12 NOTE — Progress Notes (Signed)
Patient was seen on 03/10/14 for the complete diabetes self-management series at the [facility]. This is a part of the Link to [employer].  Current A1c = 10.0%  Handouts given during class include:  Living Well with Diabetes book  Carb Counting and Meal Planning book  Meal Plan Card  Carbohydrate guide  Meal planning worksheet  Low Sodium Flavoring Tips  The diabetes portion plate  Low Carbohydrate Snack Suggestions  A1c to eAG Conversion Chart  Diabetes Medications  Stress Management  Diabetes Recommended Care Schedule  Diabetes Success Plan  Core Class Satisfaction Survey  The following learning objectives were met by the patient during this course:  Describe diabetes  State some common risk factors for diabetes  Defines the role of glucose and insulin  Identifies type of diabetes and pathophysiology  Describe the relationship between diabetes and cardiovascular risk  State the members of the Healthcare Team  States the rationale for glucose monitoring  State when to test glucose  State their individual Target Range  State the importance of logging glucose readings  Describe how to interpret glucose readings  Identifies A1C target  Explain the correlation between A1c and eAG values  State symptoms and treatment of high blood glucose  State symptoms and treatment of low blood glucose  Explain proper technique for glucose testing  Identifies proper sharps disposal  Describe the role of different macronutrients on glucose  Explain how carbohydrates affect blood glucose  State what foods contain the most carbohydrates  Demonstrate carbohydrate counting  Demonstrate how to read Nutrition Facts food label  Describe effects of various fats on heart health  Describe the importance of good nutrition for health and healthy eating strategies  Describe techniques for managing your shopping, cooking and meal  planning  List strategies to follow meal plan when dining out  Describe the effects of alcohol on glucose and how to use it safely   State the amount of activity recommended for healthy living   Describe activities suitable for individual needs   Identify ways to regularly incorporate activity into daily life   Identify barriers to activity and ways to over come these barriers  Identify diabetes medications being personally used and their primary action for lowering glucose and possible side effects   Describe role of stress on blood glucose and develop strategies to address psychosocial issues   Identify diabetes complications and ways to prevent them  Explain how to manage diabetes during illness   Evaluate success in meeting personal goal   Establish 2-3 goals that they will plan to diligently work on until they return for the  73-monthfollow-up visit  Goals:  Follow Diabetes Meal Plan as instructed  Eat 3 meals and 2 snacks, every 3-5 hrs  Limit carbohydrate intake to 45 grams carbohydrate/meal Limit carbohydrate intake to 15 grams carbohydrate/snack Add lean protein foods to meals/snacks  Monitor glucose levels as instructed by your doctor  Aim for 15-30 mins of physical activity daily as tolerated  Bring food record and glucose log to all healthcare visits  Your patient has established the following 4 month goals in their individualized success plan: None noted  Your patient has identified these potential barriers to change:  None identified  Your patient has identified their diabetes self-care support plan as  [facility]  American Diabetes Association web site  Plan: [facility]

## 2014-03-21 ENCOUNTER — Telehealth: Payer: Self-pay | Admitting: Family Medicine

## 2014-03-21 NOTE — Telephone Encounter (Signed)
I called [facility] regarding changing pt to Metformin 1000mg  long acting.  They said that is called Glumetza and it is not generic and it is not one of the free drugs.  The Metformin 500 mg that the pt is on now is the free drug.  Will leave rx alone

## 2014-03-22 NOTE — Telephone Encounter (Signed)
Work with the pharmacy to get him on a long acting metformin that is covered

## 2014-03-26 ENCOUNTER — Telehealth: Payer: Self-pay | Admitting: Family Medicine

## 2014-03-26 MED ORDER — METFORMIN HCL ER 500 MG PO TB24: 500.0000 mg | ORAL_TABLET | Freq: Two times a day (BID) | ORAL | Status: DC

## 2014-03-26 NOTE — Telephone Encounter (Signed)
Called pt regarding the diarrhea.  He said it has quieted down.  I explained Dr. Redmond School said if not better he would consider changing his meds.  Since symptoms are getting less pt will come back in September.  Advised if worse to let us know.

## 2014-03-26 NOTE — Telephone Encounter (Signed)
CALLED PHARMACIST METFORMIN IS THE GLUCOPHAGE 500 MG XR

## 2014-04-05 LAB — HM DIABETES EYE EXAM

## 2014-04-09 ENCOUNTER — Encounter: Payer: Self-pay | Admitting: Internal Medicine

## 2014-05-14 ENCOUNTER — Encounter: Payer: Self-pay | Admitting: Family Medicine

## 2014-05-14 ENCOUNTER — Ambulatory Visit (INDEPENDENT_AMBULATORY_CARE_PROVIDER_SITE_OTHER): Payer: 59 | Admitting: Family Medicine

## 2014-05-14 VITALS — BP 130/90 | HR 56 | Wt 274.0 lb

## 2014-05-14 DIAGNOSIS — I152 Hypertension secondary to endocrine disorders: Secondary | ICD-10-CM

## 2014-05-14 DIAGNOSIS — K219 Gastro-esophageal reflux disease without esophagitis: Secondary | ICD-10-CM

## 2014-05-14 DIAGNOSIS — I10 Essential (primary) hypertension: Secondary | ICD-10-CM

## 2014-05-14 DIAGNOSIS — E119 Type 2 diabetes mellitus without complications: Secondary | ICD-10-CM

## 2014-05-14 DIAGNOSIS — E1169 Type 2 diabetes mellitus with other specified complication: Secondary | ICD-10-CM

## 2014-05-14 DIAGNOSIS — E1159 Type 2 diabetes mellitus with other circulatory complications: Secondary | ICD-10-CM

## 2014-05-14 DIAGNOSIS — E785 Hyperlipidemia, unspecified: Secondary | ICD-10-CM

## 2014-05-14 DIAGNOSIS — Z23 Encounter for immunization: Secondary | ICD-10-CM

## 2014-05-14 LAB — POCT UA - MICROALBUMIN
Creatinine, POC: 166.2 mg/dL
Microalbumin Ur, POC: >300 mg/L

## 2014-05-14 LAB — POCT GLYCOSYLATED HEMOGLOBIN (HGB A1C): Hemoglobin A1C: 5.9

## 2014-05-14 MED ORDER — PRAVASTATIN SODIUM 20 MG PO TABS: 20.0000 mg | ORAL_TABLET | Freq: Every day | ORAL | Status: DC

## 2014-05-14 NOTE — Progress Notes (Signed)
Subjective:    [name] is a 53 y.o. male who presents for follow-up of Type 2 diabetes mellitus.  He is having very little difficulty with reflux.  Home blood sugar records: TEST 2 X A DAY 130'S NOW  Current symptoms/problems NONE Daily foot checks:  Any foot concerns: NONE other than he was told he needs yearly foot exams. Last eye exam:  04/05/14 [place] [facility] DR.B.BOWEN   Medication compliance: Good  Current diet: LOW CARBS Current exercise: GOAL IS TO WALK 10,000 STEPS 4 TIMES A WEEK Known diabetic complications: none Cardiovascular risk factors: diabetes mellitus, hypertension, male gender and obesity (BMI >= 30 kg/m2)   The following portions of the patient's history were reviewed and updated as appropriate: allergies, current medications, past medical history, past social history and problem list.  ROS as in subjective above    Objective:   Foot exam:  Neuro: foot monofilament exam normal   Lab Review Lab Results  Component Value Date   HGBA1C 10.0* 01/31/2014   Lab Results  Component Value Date   CHOL 182 10/23/2013   HDL 38* 10/23/2013   LDLCALC 68 10/23/2013   TRIG 380* 10/23/2013   CHOLHDL 4.8 10/23/2013   No results found for this basename: Derl Barrow     Chemistry      Component Value Date/Time   NA 134* 10/23/2013 1022   K 4.1 10/23/2013 1022   CL 95* 10/23/2013 1022   CO2 29 10/23/2013 1022   BUN 16 10/23/2013 1022   CREATININE 0.95 10/23/2013 1022   CREATININE 0.87 11/05/2010 0504      Component Value Date/Time   CALCIUM 9.4 10/23/2013 1022   ALKPHOS 62 10/23/2013 1022   AST 29 10/23/2013 1022   ALT 40 10/23/2013 1022   BILITOT 1.3* 10/23/2013 1022        Chemistry      Component Value Date/Time   NA 134* 10/23/2013 1022   K 4.1 10/23/2013 1022   CL 95* 10/23/2013 1022   CO2 29 10/23/2013 1022   BUN 16 10/23/2013 1022   CREATININE 0.95 10/23/2013 1022   CREATININE 0.87 11/05/2010 0504      Component Value Date/Time   CALCIUM 9.4  10/23/2013 1022   ALKPHOS 62 10/23/2013 1022   AST 29 10/23/2013 1022   ALT 40 10/23/2013 1022   BILITOT 1.3* 10/23/2013 1022     Hb A1c 5.9  :    Assessment:  Obesity, Class III, BMI 40-49.9 (morbid obesity)  Gastroesophageal reflux disease without esophagitis  Diabetes mellitus, new onset - Plan: POCT glycosylated hemoglobin (Hb A1C), POCT UA - Microalbumin, HM DIABETES FOOT EXAM, HM DIABETES FOOT EXAM  Hypertension associated with diabetes  Hyperlipidemia LDL goal <70 - Plan: pravastatin (PRAVACHOL) 20 MG tablet  Need for prophylactic vaccination and inoculation against influenza - Plan: Flu Vaccine QUAD 36+ mos IM  Need for prophylactic vaccination against Streptococcus pneumoniae (pneumococcus) - Plan: Pneumococcal conjugate vaccine 13-valent        Plan:    1.  Rx changes: none 2.  Education: Reviewed 'ABCs' of diabetes management (respective goals in parentheses):  A1C (<7), blood pressure (<130/80), and cholesterol (LDL <100). 3.  Compliance at present is estimated to be excellent. Efforts to improve compliance (if necessary) will be directed at Continue present diet and exercise.. 4. Follow up: 4 months  I will place him on Pravachol. I explained that although his lipid numbers look good, placing him on a statin would also help reduce  his overall cardiovascular risk. Discussed the blood pressure readings. I will hold off on making any changes until the next visit. He is now losing weight and this alone might possibly bring his blood pressure down. We also discussed the possibility of down the Road having him be able to stop taking the metformin. Discussed the fact that diabetes is a spectrum of disease and he has pushed himself back on the scale in a positive direction. Also discussed yearly exam of eyes and feet. Explained that this is usually not done early in the process

## 2014-08-15 ENCOUNTER — Other Ambulatory Visit: Payer: Self-pay | Admitting: Family Medicine

## 2014-08-29 ENCOUNTER — Ambulatory Visit
Admission: RE | Admit: 2014-08-29 | Discharge: 2014-08-29 | Disposition: A | Discharge status: Home / Self Care | Payer: 59 | Source: Ambulatory Visit | Attending: Family Medicine | Admitting: Family Medicine

## 2014-08-29 ENCOUNTER — Telehealth: Payer: Self-pay | Admitting: Family Medicine

## 2014-08-29 ENCOUNTER — Ambulatory Visit (INDEPENDENT_AMBULATORY_CARE_PROVIDER_SITE_OTHER): Payer: 59 | Admitting: Family Medicine

## 2014-08-29 ENCOUNTER — Encounter: Payer: Self-pay | Admitting: Family Medicine

## 2014-08-29 VITALS — BP 150/90 | HR 80

## 2014-08-29 DIAGNOSIS — M25571 Pain in right ankle and joints of right foot: Secondary | ICD-10-CM

## 2014-08-29 MED ORDER — HYDROCODONE-ACETAMINOPHEN 5-325 MG PO TABS: 1.0000 | ORAL_TABLET | Freq: Four times a day (QID) | ORAL | Status: DC | PRN

## 2014-08-29 NOTE — Patient Instructions (Signed)
Take 4 Advil 3 times per day and use the codeine as needed

## 2014-08-29 NOTE — Telephone Encounter (Signed)
Pt needs RF Oxycodone PAP 5/325, call when ready.  Pt states he sent you a personal email

## 2014-08-29 NOTE — Progress Notes (Signed)
   Subjective:    Patient ID: [name], male    DOB: [birth year], 53 y.o.   MRN: [id number]  HPI he woke up several days ago complaining of cramping sensation in the toes on the right foot. Since then he has had difficulty walking. There is no history of overuse. He maintains his normal daily activities. He has had no other joint involvement.  Review of Systems     Objective:   Physical Exam alert and in no distress. Pain on motion of the ankle. Slight tenderness to palpation at the insertion of the Achilles tendon. Retrocalcaneal area was normal. Slight fullness is noted in the talar area. X-ray shows degenerative changes.      Assessment & Plan:  Right ankle pain - Plan: DG Ankle Complete Right, HYDROcodone-acetaminophen (NORCO) 5-325 MG per tablet  he is to use ibuprofen 800 mg 3 times a day and Norco as necessary. If he continues to have trouble, I will refer him to orthopedics

## 2014-08-30 ENCOUNTER — Encounter: Payer: Self-pay | Admitting: Family Medicine

## 2014-09-01 ENCOUNTER — Other Ambulatory Visit: Payer: Self-pay | Admitting: Family Medicine

## 2014-09-01 DIAGNOSIS — M25562 Pain in left knee: Secondary | ICD-10-CM

## 2014-09-01 MED ORDER — OXYCODONE-ACETAMINOPHEN 5-325 MG PO TABS: 1.0000 | ORAL_TABLET | Freq: Three times a day (TID) | ORAL | Status: DC | PRN

## 2014-09-01 NOTE — Progress Notes (Signed)
He is still having foot pain. Roxicet will be given

## 2014-09-03 ENCOUNTER — Encounter: Payer: Self-pay | Admitting: Family Medicine

## 2014-09-03 NOTE — Progress Notes (Signed)
   Subjective:    Patient ID: [name], male    DOB: [birth year], 54 y.o.   MRN: [id number]  HPI    Review of Systems     Objective:   Physical Exam        Assessment & Plan:  A prescription was written for Norco however there was an error in the medication and he does not need it.

## 2014-09-05 ENCOUNTER — Telehealth: Payer: Self-pay | Admitting: Medical

## 2014-09-05 ENCOUNTER — Ambulatory Visit (INDEPENDENT_AMBULATORY_CARE_PROVIDER_SITE_OTHER): Payer: Self-pay | Admitting: Medical

## 2014-09-05 ENCOUNTER — Encounter: Payer: Self-pay | Admitting: Medical

## 2014-09-05 VITALS — BP 160/90 | HR 75 | Temp 98.2°F | Resp 16 | Ht 70.0 in | Wt 271.0 lb

## 2014-09-05 DIAGNOSIS — I10 Essential (primary) hypertension: Secondary | ICD-10-CM

## 2014-09-05 DIAGNOSIS — Z8673 Personal history of transient ischemic attack (TIA), and cerebral infarction without residual deficits: Secondary | ICD-10-CM

## 2014-09-05 DIAGNOSIS — Z9989 Dependence on other enabling machines and devices: Secondary | ICD-10-CM

## 2014-09-05 DIAGNOSIS — G4733 Obstructive sleep apnea (adult) (pediatric): Secondary | ICD-10-CM

## 2014-09-05 DIAGNOSIS — Z0289 Encounter for other administrative examinations: Secondary | ICD-10-CM

## 2014-09-05 DIAGNOSIS — Z008 Encounter for other general examination: Secondary | ICD-10-CM

## 2014-09-05 DIAGNOSIS — E119 Type 2 diabetes mellitus without complications: Secondary | ICD-10-CM

## 2014-09-05 DIAGNOSIS — I480 Paroxysmal atrial fibrillation: Secondary | ICD-10-CM

## 2014-09-05 DIAGNOSIS — R809 Proteinuria, unspecified: Secondary | ICD-10-CM

## 2014-09-05 LAB — POCT URINALYSIS DIPSTICK
Bilirubin, UA: NEGATIVE
Blood, UA: NEGATIVE
GLUCOSE UA: NEGATIVE
Ketones, UA: NEGATIVE
Leukocytes, UA: NEGATIVE
Nitrite, UA: NEGATIVE
Spec Grav, UA: 1.025
Urobilinogen, UA: NEGATIVE
pH, UA: 6

## 2014-09-05 NOTE — Telephone Encounter (Signed)
I called over to [place] to get the compliance report and [facility] said they would fax it over.

## 2014-09-05 NOTE — Progress Notes (Signed)
WHISPER TEST- 10 FEET  COLOR TEST- NORMAL  PERIPHERAL TEST- 90 DEGREE

## 2014-09-05 NOTE — Telephone Encounter (Signed)
[name].  I need CPAP compliance report for DOT physical clearance.

## 2014-09-05 NOTE — Progress Notes (Signed)
Commercial Driver Medical Examination   [name] is a 54 y.o. male who presents today for a commercial driver fitness determination physical exam.  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   Last DOT physical 2014 here with Dr. Redmond School.  Medical care team includes:  Dr. Redmond School here for primary care since 1976.  Review of Systems A comprehensive review of systems was reviewed and noted as below:  Eye: +uses reading glasses, but otherwise no corrective lenses, -lasik surgery or other eye surgery, -glaucoma, -cataracts, -macular degeneration, -monocular vision, -medication for eye condition, -blurred vision,   Ears: -hearing problems, - hearing aids, -ear pain, -ear drainage, -ear fullness, -tinnitus, -recurrent ear infection, -previous ear surgery, - vertigo, -meniere's disease  Endocrine: -polydipsia, -polyuria, +intentional weight loss, -fainting, -dizziness, - altered or loss of consciousness, -hypoglycemia  Cardiovascular: +heart disease, -CHF, -heart attack, -cardiac stents, -bypass surgery, +other heart surgery/prior procedure for PFO, +hypertension, -blood clots, -pacemaker, -medications for heart condition, -chest pain, -SOB, -palpitations, -fainting, -dizziness, -dyspnea  Respiratory: -asthma, -COPD, other lung disease, -smoker, -chest tightness, - wheezing, -snoring, -daytime sleepiness, +sleep apnea/uses CPAP, -narcolepsy  Allergy: -uncontrollable sneezing or allergy symptoms  Musculoskeletal: -missing body parts, -muscle disease, -bone disease, -spine injury, -low back pain, -medication for joints, bones, muscles or pain, -physical limitations, -joint pain, -neck pain, -limitations of neck ROM, -back surgery, +left hip replacement,  +hx/o psoriasis, -gout  Neurologic: -neurologic disease, -dementia, -seizures, -parkinsons, -tremor, -memory problems,  -weakness, -numbness, -tingling, -medication for neurologic condition, -medications for sleep condition  Gastric: -abdominal pain, -chronic diarrhea or IBS, -uncontrollable nausea  Kidney/Renal: -hematuria, -dialysis, kidney disease, polycystic kidney disease  Psychiatric: -homicidal thoughts, -suicidal thoughts, -prior suicide attempts, -get into fights/hurting others, -memory or concentration problems, -delusions, -hallucinations, -hospitalization for mental health problem, -depression, -anxiety, -bipolar  Drug use: - none  Reviewed their medical, surgical, family, social, medication, and allergy history and updated chart as appropriate.     Objective:   Physical Exam  BP 160/90 mmHg  Pulse 75  Temp(Src) 98.2 F (36.8 C) (Oral)  Resp 16  Ht 5\' 10"  (1.778 m)  Wt 271 lb (122.925 kg)  BMI 38.88 kg/m2  General appearance: alert, no distress, WD/WN, obese white male Skin: several scattered psoriasis plaques bilat flanks, arms legs, no worrisome findings otherwise  HEENT: normocephalic, conjunctiva/corneas normal, sclerae anicteric, PERRLA, EOMi, nares patent, no discharge or erythema, pharynx normal Oral cavity: MMM, tongue normal, teeth in good repair Neck: supple, no lymphadenopathy, no thyromegaly, no masses, normal ROM, no bruits Chest: non tender, normal shape and expansion Heart: RRR, normal S1, S2, no murmurs Lungs: CTA bilaterally, no wheezes, rhonchi, or rales Abdomen: +bs, soft, obese, non tender, non distended, no masses, no hepatomegaly, no splenomegaly, no bruits Back: non tender, normal ROM, no scoliosis Musculoskeletal: left buttock/posterior hip surgical scar, left forearm dorsal with diagonal long scar from childhood trauma, otherwise upper extremities non tender, no obvious deformity, normal ROM throughout, lower extremities non tender, no obvious deformity, normal ROM throughout Extremities: no edema, no cyanosis, no clubbing Pulses: 2+ symmetric, upper and lower  extremities, normal cap refill Neurological: alert, oriented x 3, CN2-12 intact, strength normal upper extremities and lower extremities, sensation normal throughout, DTRs 2+ throughout, no cerebellar signs, gait normal Psychiatric: normal affect, behavior normal, pleasant  GU: no hernia Rectal: deferred  Vision:  Uncorrected Corrected Horizontal Field of Vision  Right Eye 20/40 n/a 90  degrees  Left Eye  20/40 n/a 90 degrees  Both Eyes  54/00 n/a    Applicant can recognize and distinguish among traffic control signals and devices showing standard red, green, and amber colors.  Monocular Vision?: No   Hearing: Passed forced whisper test at 10' bilat.  Labs: Urinalysis reviewed    Assessment:   Encounter Diagnoses  Name Primary?  . Health examination of defined subpopulation Yes  . Essential hypertension   . Diabetes type 2, controlled   . 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 OSA on CPAP   . Proteinuria   . Paroxysmal atrial fibrillation     Plan:   DOT evaluation - Discussed his case with supervising physician and his [facility] Dr. Redmond School here.  Forms completed for DOT with 45mo extension due to HTN, OSA, and proteinuria.   Hypertension-three-month extension given, will need to follow-up with Dr. Redmond School for further treatment recommendations.  Diabetes type 2-reviewed recent labs including recent hemoglobin A1c of 5.9% in September  2015 which is much improved from prior in the same year. He is compliant with metformin twice daily, has made significant improvements in diet and exercise over this past year  History of TIA with repair of PFO and no problems since.  I reviewed his last stress test which was from 2012, last cardiology notes from 2011.   OSA on CPAP, will request compliance report.  He reports very good compliance and no particular complaint or symptom of sleep apnea at this time     Proteinuria-follow-up with Dr. Redmond School soon to discuss further   Reviewed 2/ 2012 myocardial perfusion scan with no ischemia, 51% ejection fraction. The last cardiac report was reviewed from February 2011 with Dr. Gwenlyn Found.  At that time he was continued on Plavix, has history of PFO closure with STARFlex septal occluder and had subsequent paroxysmal atrial fibrillation felt to be related to the occluder. He has been on his current medication regimen for quite some time and does fine on this except for higher blood pressures of late  F/u with Dr. Redmond School.

## 2014-09-06 ENCOUNTER — Encounter: Payer: Self-pay | Admitting: Medical

## 2014-09-06 ENCOUNTER — Telehealth: Payer: Self-pay | Admitting: Medical

## 2014-09-06 NOTE — Telephone Encounter (Signed)
Please call and let him know that his DOT paperwork is ready to pickup.   The expiration date is 12/05/2014 with a three-month extension from yesterday.  Recommend he return soon to discuss issues with Dr. Redmond School including uncontrolled high blood pressure, protein in the urine, recheck on paroxysmal atrial fibrillation.  I will await his CPAP compliance report that we called for yesterday  Wendy/Chandra - please make sure we copy the DOT forms for our records too, give him originals.

## 2014-09-07 NOTE — Telephone Encounter (Signed)
Patient is aware that his DOT forms are ready for pick up and he is also aware of Dorothea Ogle PA recommendations for his follow up with Dr. Redmond School.

## 2014-09-07 NOTE — Telephone Encounter (Signed)
LMOM TO CB. CLS 

## 2014-09-17 ENCOUNTER — Encounter: Payer: Self-pay | Admitting: Family Medicine

## 2014-09-17 ENCOUNTER — Ambulatory Visit (INDEPENDENT_AMBULATORY_CARE_PROVIDER_SITE_OTHER): Payer: 59 | Admitting: Family Medicine

## 2014-09-17 VITALS — BP 134/90 | HR 70 | Wt 268.0 lb

## 2014-09-17 DIAGNOSIS — E1159 Type 2 diabetes mellitus with other circulatory complications: Secondary | ICD-10-CM | POA: Insufficient documentation

## 2014-09-17 DIAGNOSIS — E119 Type 2 diabetes mellitus without complications: Secondary | ICD-10-CM

## 2014-09-17 DIAGNOSIS — I10 Essential (primary) hypertension: Secondary | ICD-10-CM | POA: Insufficient documentation

## 2014-09-17 DIAGNOSIS — I152 Hypertension secondary to endocrine disorders: Secondary | ICD-10-CM

## 2014-09-17 DIAGNOSIS — E1169 Type 2 diabetes mellitus with other specified complication: Secondary | ICD-10-CM

## 2014-09-17 DIAGNOSIS — Z8673 Personal history of transient ischemic attack (TIA), and cerebral infarction without residual deficits: Secondary | ICD-10-CM

## 2014-09-17 DIAGNOSIS — G4733 Obstructive sleep apnea (adult) (pediatric): Secondary | ICD-10-CM

## 2014-09-17 DIAGNOSIS — E785 Hyperlipidemia, unspecified: Secondary | ICD-10-CM | POA: Insufficient documentation

## 2014-09-17 DIAGNOSIS — L400 Psoriasis vulgaris: Secondary | ICD-10-CM

## 2014-09-17 DIAGNOSIS — K219 Gastro-esophageal reflux disease without esophagitis: Secondary | ICD-10-CM

## 2014-09-17 DIAGNOSIS — E669 Obesity, unspecified: Secondary | ICD-10-CM

## 2014-09-17 LAB — CBC WITH DIFFERENTIAL/PLATELET
BASOS PCT: 0 % (ref 0–1)
Basophils Absolute: 0 10*3/uL (ref 0.0–0.1)
EOS ABS: 0.1 10*3/uL (ref 0.0–0.7)
Eosinophils Relative: 1 % (ref 0–5)
HEMATOCRIT: 53.8 % — AB (ref 39.0–52.0)
HEMOGLOBIN: 19.5 g/dL — AB (ref 13.0–17.0)
LYMPHS ABS: 2.2 10*3/uL (ref 0.7–4.0)
LYMPHS PCT: 24 % (ref 12–46)
MCH: 31.3 pg (ref 26.0–34.0)
MCHC: 36.2 g/dL — ABNORMAL HIGH (ref 30.0–36.0)
MCV: 86.2 fL (ref 78.0–100.0)
MONOS PCT: 6 % (ref 3–12)
MPV: 8.9 fL (ref 8.6–12.4)
Monocytes Absolute: 0.5 10*3/uL (ref 0.1–1.0)
Neutro Abs: 6.2 10*3/uL (ref 1.7–7.7)
Neutrophils Relative %: 69 % (ref 43–77)
Platelets: 258 10*3/uL (ref 150–400)
RBC: 6.24 MIL/uL — ABNORMAL HIGH (ref 4.22–5.81)
RDW: 13.5 % (ref 11.5–15.5)
WBC: 9 10*3/uL (ref 4.0–10.5)

## 2014-09-17 LAB — POCT UA - MICROALBUMIN
ALBUMIN/CREATININE RATIO, URINE, POC: 124.2
Creatinine, POC: 233 mg/dL
MICROALBUMIN (UR) POC: 289.3 mg/L

## 2014-09-17 LAB — COMPREHENSIVE METABOLIC PANEL
ALT: 43 U/L (ref 0–53)
AST: 26 U/L (ref 0–37)
Albumin: 4.6 g/dL (ref 3.5–5.2)
Alkaline Phosphatase: 57 U/L (ref 39–117)
BUN: 13 mg/dL (ref 6–23)
CO2: 23 mEq/L (ref 19–32)
Calcium: 9.9 mg/dL (ref 8.4–10.5)
Chloride: 97 mEq/L (ref 96–112)
Creat: 1.01 mg/dL (ref 0.50–1.35)
Glucose, Bld: 125 mg/dL — ABNORMAL HIGH (ref 70–99)
POTASSIUM: 4.1 meq/L (ref 3.5–5.3)
SODIUM: 133 meq/L — AB (ref 135–145)
Total Bilirubin: 1.4 mg/dL — ABNORMAL HIGH (ref 0.2–1.2)
Total Protein: 7.7 g/dL (ref 6.0–8.3)

## 2014-09-17 LAB — LIPID PANEL
CHOLESTEROL: 177 mg/dL (ref 0–200)
HDL: 43 mg/dL (ref >39)
LDL CALC: 92 mg/dL (ref 0–99)
Total CHOL/HDL Ratio: 4.1 Ratio
Triglycerides: 211 mg/dL — ABNORMAL HIGH (ref <150)
VLDL: 42 mg/dL — ABNORMAL HIGH (ref 0–40)

## 2014-09-17 LAB — POCT GLYCOSYLATED HEMOGLOBIN (HGB A1C): HEMOGLOBIN A1C: 5.3

## 2014-09-17 NOTE — Progress Notes (Signed)
Subjective:    [name] is a 54 y.o. male who presents for follow-up of Type 2 diabetes mellitus.  He is involved in a wellness program through the hospital and is being very compliant with this. He continues on his psoriasis medication. He does have GERD and is now using his PPI every other day. He continues on his CPAP. He has had his present machine for over 8 years. He has a previous history of TIA and does not like Plavix. He would like to consider coming off this. He is also dealing with the fact that his father's failing health is requiring more of his attention. Apparently also some potential legal matters between the father and his wife's relatives.  Home blood sugar records: Patient test one time a day Current symptoms/problems NONE Daily foot checks:   Any foot concerns: NONE Last eye exam:  04/05/14   Medication compliance: Excellent Current diet: watching intake. He has now lost approximately 50 pounds. Current exercise: 10,000 steps a day Known diabetic complications: none Cardiovascular risk factors: diabetes mellitus, dyslipidemia, hypertension, male gender and obesity (BMI >= 30 kg/m2)   ROS as in subjective above    Objective:    General appearence: alert, no distress, WD/WN hemoglobin A1c is 5.3 Lab Review Lab Results  Component Value Date   HGBA1C 5.9 05/14/2014   Lab Results  Component Value Date   CHOL 182 10/23/2013   HDL 38* 10/23/2013   LDLCALC 68 10/23/2013   TRIG 380* 10/23/2013   CHOLHDL 4.8 10/23/2013   No results found for: Derl Barrow   Chemistry      Component Value Date/Time   NA 134* 10/23/2013 1022   K 4.1 10/23/2013 1022   CL 95* 10/23/2013 1022   CO2 29 10/23/2013 1022   BUN 16 10/23/2013 1022   CREATININE 0.95 10/23/2013 1022   CREATININE 0.87 11/05/2010 0504      Component Value Date/Time   CALCIUM 9.4 10/23/2013 1022   ALKPHOS 62 10/23/2013 1022   AST 29 10/23/2013 1022   ALT 40 10/23/2013 1022   BILITOT  1.3* 10/23/2013 1022        Chemistry      Component Value Date/Time   NA 134* 10/23/2013 1022   K 4.1 10/23/2013 1022   CL 95* 10/23/2013 1022   CO2 29 10/23/2013 1022   BUN 16 10/23/2013 1022   CREATININE 0.95 10/23/2013 1022   CREATININE 0.87 11/05/2010 0504      Component Value Date/Time   CALCIUM 9.4 10/23/2013 1022   ALKPHOS 62 10/23/2013 1022   AST 29 10/23/2013 1022   ALT 40 10/23/2013 1022   BILITOT 1.3* 10/23/2013 1022    Blood pressure here for cyst at home has a 10 point differential with systolic and diastolic     Assessment:  Type 2 diabetes mellitus without complication - Plan: POCT glycosylated hemoglobin (Hb A1C), POCT UA - Microalbumin, CBC with Differential, Comprehensive metabolic panel, Lipid panel  Gastroesophageal reflux disease without esophagitis  Hx-TIA (transient ischemic attack)  Plaque psoriasis  Obesity (BMI 30-39.9)  Obstructive sleep apnea  Hyperlipidemia associated with type 2 diabetes mellitus - Plan: Lipid panel  Hypertension associated with diabetes - Plan: CBC with Differential, Comprehensive metabolic panel, Lipid panel        Plan:    1.  Rx changes: none 2.  Education: Reviewed 'ABCs' of diabetes management (respective goals in parentheses):  A1C (<7), blood pressure (<130/80), and cholesterol (LDL <100). 3.  Compliance at  present is estimated to be excellent. Efforts to improve compliance (if necessary) will be directed at No change. 4. Follow up: 4 months   Discussed the fact that his blood pressure machine at home is reading 10 points higher. Did not recommend that he stop Plavix due to his underlying history of TIA as well as history of atrial fibrillation. Recommend he continue with his every other day dosing for his PPI and potentially stretched this out to every 3 days. Prescription was written for him to get another CPAP. He will check with his DME company concerning this. He will continue in the wellness program.  Discussed stopping his metformin but since his blood sugars in the morning are running in the 120-148 range, I would like to continue it at this point. Discussed possibly readjusting some his medications and reassessing his need for the CPAP sometime later this year. Over 45 minutes spent discussing all these issues with him.

## 2014-09-18 ENCOUNTER — Other Ambulatory Visit: Payer: Self-pay

## 2014-09-18 DIAGNOSIS — D582 Other hemoglobinopathies: Secondary | ICD-10-CM

## 2014-09-20 ENCOUNTER — Telehealth: Payer: Self-pay | Admitting: Internal Medicine

## 2014-09-20 NOTE — Telephone Encounter (Signed)
LEFT MESSAGE FOR PATIENT TO RETURN CALL TO SCHEDULE NP APPT [phone].

## 2014-09-21 ENCOUNTER — Telehealth: Payer: Self-pay | Admitting: Internal Medicine

## 2014-09-21 NOTE — Telephone Encounter (Signed)
S/W PATIETN AND GAVE NP APPT FOR 02/09 @ 11 W/DR. MOHAMED.  REFERRING DR. Redmond School DX- ELEVATED HGB

## 2014-10-08 ENCOUNTER — Other Ambulatory Visit: Payer: Self-pay | Admitting: *Deleted

## 2014-10-08 DIAGNOSIS — D582 Other hemoglobinopathies: Secondary | ICD-10-CM

## 2014-10-09 ENCOUNTER — Other Ambulatory Visit (HOSPITAL_BASED_OUTPATIENT_CLINIC_OR_DEPARTMENT_OTHER): Payer: 59

## 2014-10-09 ENCOUNTER — Telehealth: Payer: Self-pay | Admitting: *Deleted

## 2014-10-09 ENCOUNTER — Ambulatory Visit (HOSPITAL_BASED_OUTPATIENT_CLINIC_OR_DEPARTMENT_OTHER): Payer: 59

## 2014-10-09 ENCOUNTER — Ambulatory Visit: Payer: 59

## 2014-10-09 ENCOUNTER — Ambulatory Visit (HOSPITAL_BASED_OUTPATIENT_CLINIC_OR_DEPARTMENT_OTHER): Payer: 59 | Admitting: Internal Medicine

## 2014-10-09 ENCOUNTER — Encounter: Payer: Self-pay | Admitting: Internal Medicine

## 2014-10-09 ENCOUNTER — Telehealth: Payer: Self-pay | Admitting: Internal Medicine

## 2014-10-09 VITALS — BP 139/87 | HR 68 | Temp 98.4°F | Resp 20 | Ht 70.0 in | Wt 276.0 lb

## 2014-10-09 DIAGNOSIS — D45 Polycythemia vera: Secondary | ICD-10-CM

## 2014-10-09 DIAGNOSIS — D751 Secondary polycythemia: Secondary | ICD-10-CM

## 2014-10-09 DIAGNOSIS — D582 Other hemoglobinopathies: Secondary | ICD-10-CM

## 2014-10-09 LAB — LACTATE DEHYDROGENASE (CC13): LDH: 129 U/L (ref 125–245)

## 2014-10-09 LAB — CBC WITH DIFFERENTIAL/PLATELET
BASO%: 1.2 % (ref 0.0–2.0)
Basophils Absolute: 0.1 10*3/uL (ref 0.0–0.1)
EOS%: 2.7 % (ref 0.0–7.0)
Eosinophils Absolute: 0.2 10*3/uL (ref 0.0–0.5)
HCT: 52.4 % — ABNORMAL HIGH (ref 38.4–49.9)
HGB: 17.5 g/dL — ABNORMAL HIGH (ref 13.0–17.1)
LYMPH%: 41.4 % (ref 14.0–49.0)
MCH: 29.6 pg (ref 27.2–33.4)
MCHC: 33.5 g/dL (ref 32.0–36.0)
MCV: 88.3 fL (ref 79.3–98.0)
MONO#: 0.5 10*3/uL (ref 0.1–0.9)
MONO%: 6.9 % (ref 0.0–14.0)
NEUT#: 3.4 10*3/uL (ref 1.5–6.5)
NEUT%: 47.8 % (ref 39.0–75.0)
PLATELETS: 208 10*3/uL (ref 140–400)
RBC: 5.93 10*6/uL — AB (ref 4.20–5.82)
RDW: 13.8 % (ref 11.0–14.6)
WBC: 7.1 10*3/uL (ref 4.0–10.3)
lymph#: 2.9 10*3/uL (ref 0.9–3.3)

## 2014-10-09 LAB — COMPREHENSIVE METABOLIC PANEL (CC13)
ALBUMIN: 3.9 g/dL (ref 3.5–5.0)
ALT: 36 U/L (ref 0–55)
AST: 22 U/L (ref 5–34)
Alkaline Phosphatase: 55 U/L (ref 40–150)
Anion Gap: 10 mEq/L (ref 3–11)
BILIRUBIN TOTAL: 1.1 mg/dL (ref 0.20–1.20)
BUN: 16.2 mg/dL (ref 7.0–26.0)
CO2: 25 mEq/L (ref 22–29)
Calcium: 9.6 mg/dL (ref 8.4–10.4)
Chloride: 102 mEq/L (ref 98–109)
Creatinine: 1.1 mg/dL (ref 0.7–1.3)
EGFR: 79 mL/min/{1.73_m2} — AB (ref >90)
Glucose: 106 mg/dl (ref 70–140)
POTASSIUM: 4.2 meq/L (ref 3.5–5.1)
SODIUM: 136 meq/L (ref 136–145)
TOTAL PROTEIN: 6.9 g/dL (ref 6.4–8.3)

## 2014-10-09 LAB — IRON AND TIBC CHCC
%SAT: 44 % (ref 20–55)
Iron: 155 ug/dL (ref 42–163)
TIBC: 353 ug/dL (ref 202–409)
UIBC: 198 ug/dL (ref 117–376)

## 2014-10-09 LAB — FERRITIN CHCC: Ferritin: 249 ng/ml (ref 22–316)

## 2014-10-09 NOTE — Telephone Encounter (Signed)
Per staff message and POF I have scheduled appts. Advised scheduler of appts. JMW  

## 2014-10-09 NOTE — Progress Notes (Signed)
Checked in new pt with no financial concerns prior to seeing the dr.  [name] is here for a hematology concern so financial assistance may not be needed but he has [name]'s card for any billing questions or concerns.

## 2014-10-09 NOTE — Progress Notes (Signed)
1545 One unit of 500 mls phlebotomy completed from rt arm. Pt tolerated procedure well. Ate snack well.  Po fluid intake is good. Denies distress. At 1615,  VSS, denies distress.   Phlebotomy site CDI on Lt arm. Encouraged to rest and drink PO fluids this evening.

## 2014-10-09 NOTE — Patient Instructions (Signed)

## 2014-10-09 NOTE — Telephone Encounter (Signed)
Gave avs & calendar for February. Sent message to schedule phlebotomy.

## 2014-10-09 NOTE — Progress Notes (Signed)
[facility] [phone]   [phone]  CONSULT NOTE  REFERRING PHYSICIAN: Dr. Jill Alexanders  REASON FOR CONSULTATION:  54 years old white male with polycythemia  HPI [name] is a 54 y.o. male with past medical history significant for diabetes mellitus, TIA, obesity, obstructive sleep apnea, dyslipidemia, hypertension, GERD, psoriasis as well as left hip replacement. The patient was seen recently by his primary care physician Dr. Redmond School and repeat CBC on 09/17/2014 showed elevated hemoglobin of 19.5 and hematocrit 53.8%. Previous CBC on 10/23/2013 showed hemoglobin of 18.2 when hematocrit 50.5%. Because of this persistent abnormalities the patient was referred to me today for evaluation and recommendation regarding his condition. The patient used to be morbidly obese but he lost a lot of weight recently and he is currently down to 264 pounds. He is a never smoker but has a history of obstructive sleep apnea and has to C-PAP every night. He is not on any hormonal supplements but he used steroid cream for his psoriasis.  He denied having any other significant complaints today except for nasal congestion on the right side of the nose. He denied having any significant chest pain but has shortness breath with exertion was no cough or hemoptysis. The patient denied having any unintentional weight loss or night sweats. The patient denied having any significant fever or chills. Has no headache or blurry vision. He used to donate blood to the [employer] for several years but not recently after he was started on treatment with Coumadin and Plavix for the TIA. Family history significant for a mother who died from male genital cancer, father had throat cancer but still alive at age 51. The patient is married and has 2 sons age 47 and 80. He works as a Radiation protection practitioner. He has no history of smoking but drinks moderate amount of alcohol and no history of  drug abuse.  HPI  Past Medical History  Diagnosis Date  . Obesity   . Herpes labialis   . Seborrheic dermatitis   . Psoriasis   . Hypertension   . GERD (gastroesophageal reflux disease)   . Sleep apnea   . Arthritis   . Proteinuria   . Dyslipidemia   . TIA (transient ischemic attack)   . Metabolic syndrome   . Diverticulosis     Past Surgical History  Procedure Laterality Date  . Joint replacement  11/03/10    LT HIP  . Colonoscopy    . Tonsillectomy and adenoidectomy    . Wisdom tooth extraction    . Total hip arthroplasty Left     Family History  Problem Relation Age of Onset  . Uterine cancer Mother 76    Cervical cath  . Crohn's disease Mother   . Esophageal cancer Father   . Cancer Other   . Obesity Other     Social History History  Substance Use Topics  . Smoking status: Never Smoker   . Smokeless tobacco: Never Used  . Alcohol Use: Yes     Comment: 2/month    No Known Allergies  Current Outpatient Prescriptions  Medication Sig Dispense Refill  . Apremilast (OTEZLA) 30 MG TABS Take by mouth.    Marland Kitchen aspirin 81 MG tablet Take 81 mg by mouth daily.    . benazepril (LOTENSIN) 40 MG tablet Take 1 tablet (40 mg total) by mouth daily. 90 tablet 3  . clopidogrel (PLAVIX) 75 MG tablet TAKE 1 TABLET BY MOUTH DAILY.  90 tablet 3  . diltiazem (CARDIZEM CD) 240 MG 24 hr capsule TAKE 1 CAPSULE BY MOUTH DAILY. 90 capsule 3  . HYDROcodone-acetaminophen (NORCO) 5-325 MG per tablet Take 1 tablet by mouth every 6 (six) hours as needed for moderate pain. 30 tablet 0  . metFORMIN (GLUCOPHAGE XR) 500 MG 24 hr tablet Take 1 tablet (500 mg total) by mouth 2 (two) times daily. 60 tablet 3  . ONETOUCH DELICA LANCETS FINE MISC 2 strips by Does not apply route 2 (two) times daily. Test blood sugars twice daily. Dx code 250.00 100 each 0  . ONETOUCH VERIO test strip CHECK BLOOD SUGAR 2 TIMES A DAY 100 each 3  . pantoprazole (PROTONIX) 40 MG tablet TAKE 1 TABLET BY MOUTH DAILY. 90  tablet 3  . pravastatin (PRAVACHOL) 20 MG tablet Take 1 tablet (20 mg total) by mouth daily. 90 tablet 3   No current facility-administered medications for this visit.    Review of Systems  Constitutional: negative Eyes: negative Ears, nose, mouth, throat, and face: negative Respiratory: positive for dyspnea on exertion Cardiovascular: negative Gastrointestinal: negative Genitourinary:negative Integument/breast: negative Hematologic/lymphatic: negative Musculoskeletal:negative Neurological: negative Behavioral/Psych: negative Endocrine: negative Allergic/Immunologic: negative  Physical Exam  IAX:KPVVZ, healthy, no distress, well nourished and well developed SKIN: skin color, texture, turgor are normal, no rashes or significant lesions HEAD: Normocephalic, No masses, lesions, tenderness or abnormalities EYES: normal, PERRLA EARS: External ears normal, Canals clear OROPHARYNX:no exudate, no erythema and lips, buccal mucosa, and tongue normal  NECK: supple, no adenopathy, no JVD LYMPH:  no palpable lymphadenopathy, no hepatosplenomegaly LUNGS: clear to auscultation , and palpation HEART: regular rate & rhythm, no murmurs and no gallops ABDOMEN:abdomen soft, non-tender, obese, normal bowel sounds and no masses or organomegaly BACK: Back symmetric, no curvature., No CVA tenderness EXTREMITIES:no joint deformities, effusion, or inflammation, no edema, no skin discoloration, no clubbing  NEURO: alert & oriented x 3 with fluent speech, no focal motor/sensory deficits  PERFORMANCE STATUS: ECOG 1  LABORATORY DATA: Lab Results  Component Value Date   WBC 7.1 10/09/2014   HGB 17.5* 10/09/2014   HCT 52.4* 10/09/2014   MCV 88.3 10/09/2014   PLT 208 10/09/2014      Chemistry      Component Value Date/Time   NA 136 10/09/2014 1057   NA 133* 09/17/2014 0906   K 4.2 10/09/2014 1057   K 4.1 09/17/2014 0906   CL 97 09/17/2014 0906   CO2 25 10/09/2014 1057   CO2 23 09/17/2014  0906   BUN 16.2 10/09/2014 1057   BUN 13 09/17/2014 0906   CREATININE 1.1 10/09/2014 1057   CREATININE 1.01 09/17/2014 0906   CREATININE 0.87 11/05/2010 0504      Component Value Date/Time   CALCIUM 9.6 10/09/2014 1057   CALCIUM 9.9 09/17/2014 0906   ALKPHOS 55 10/09/2014 1057   ALKPHOS 57 09/17/2014 0906   AST 22 10/09/2014 1057   AST 26 09/17/2014 0906   ALT 36 10/09/2014 1057   ALT 43 09/17/2014 0906   BILITOT 1.10 10/09/2014 1057   BILITOT 1.4* 09/17/2014 0906       RADIOGRAPHIC STUDIES: No results found.  ASSESSMENT: This is a very pleasant 54 years old white male who presented for evaluation of persistent polycythemia. The etiology is unclear but this is most likely reactive in nature especially with his current obstructive sleep apnea and intermittent use of steroids cream, but I cannot exclude polycythemia vera at this point.   PLAN: I had a lengthy  discussion with the patient today about his current condition and further investigation to confirm the diagnosis as well as his treatment options. I will order several studies today to evaluate his polycythemia including repeat CBC, comprehensive metabolic panel, LDH, serum iron, ferritin, serum erythropoietin as well as Jak 2 mutation. His CBC today showed persistent elevation of his hemoglobin and hematocrit. I recommended for the patient to proceed with phlebotomy today to reduce the volume of his red blood cells to avoid any risk of TIA or stroke. He is currently on Plavix and I recommended for him to continue with this medication. I would see him back for follow-up visit in 3 weeks for reevaluation and discussion of the pending lab results as well as phlebotomy as needed. I also had a lengthy discussion with the patient about changing his lifestyle and recommendation regarding further weight loss which may result in improvement in all his other medical conditions including the diabetes mellitus, hypertension and obstructive  sleep apnea. The patient was advised to call immediately if he has any concerning symptoms in the interval.  The patient voices understanding of current disease status and treatment options and is in agreement with the current care plan.  All questions were answered. The patient knows to call the clinic with any problems, questions or concerns. We can certainly see the patient much sooner if necessary.  Thank you so much for allowing me to participate in the care of [name]. I will continue to follow up the patient with you and assist in his care.  I spent 40 minutes counseling the patient face to face. The total time spent in the appointment was 60 minutes.  Disclaimer: This note was dictated with voice recognition software. Similar sounding words can inadvertently be transcribed and may not be corrected upon review.   Candela Krul K. October 09, 2014, 12:29 PM

## 2014-10-11 LAB — ERYTHROPOIETIN: ERYTHROPOIETIN: 15.7 m[IU]/mL (ref 2.6–18.5)

## 2014-10-15 ENCOUNTER — Telehealth: Payer: Self-pay | Admitting: Internal Medicine

## 2014-10-15 ENCOUNTER — Other Ambulatory Visit: Payer: Self-pay

## 2014-10-15 NOTE — Telephone Encounter (Signed)
Per pharmacy fax, Pt insurance starting march 1st will no longer cover one touch verio for 0 copay. Can it be changed to true result test strips and lancets for 0 copay, fax states pharmacy test 2 times a day. If so send new rx to [facility]

## 2014-10-15 NOTE — Telephone Encounter (Signed)
Fix it 

## 2014-10-16 ENCOUNTER — Other Ambulatory Visit: Payer: Self-pay

## 2014-10-16 MED ORDER — LANCETS MICRO THIN 33G MISC: Status: DC

## 2014-10-16 MED ORDER — BLOOD GLUCOSE TEST VI STRP: ORAL_STRIP | Status: DC

## 2014-10-16 MED ORDER — TRUERESULT BLOOD GLUCOSE W/DEVICE KIT: 1.0000 | PACK | Freq: Two times a day (BID) | Status: DC

## 2014-10-16 NOTE — Telephone Encounter (Signed)
THIS HAS BEEN DONE

## 2014-10-31 ENCOUNTER — Ambulatory Visit (HOSPITAL_BASED_OUTPATIENT_CLINIC_OR_DEPARTMENT_OTHER): Payer: 59 | Admitting: Internal Medicine

## 2014-10-31 ENCOUNTER — Ambulatory Visit (HOSPITAL_BASED_OUTPATIENT_CLINIC_OR_DEPARTMENT_OTHER): Payer: 59

## 2014-10-31 ENCOUNTER — Encounter: Payer: Self-pay | Admitting: Internal Medicine

## 2014-10-31 ENCOUNTER — Other Ambulatory Visit (HOSPITAL_BASED_OUTPATIENT_CLINIC_OR_DEPARTMENT_OTHER): Payer: 59

## 2014-10-31 ENCOUNTER — Telehealth: Payer: Self-pay | Admitting: Internal Medicine

## 2014-10-31 VITALS — BP 157/84 | HR 76 | Temp 97.8°F | Resp 20 | Ht 70.0 in | Wt 277.4 lb

## 2014-10-31 DIAGNOSIS — D751 Secondary polycythemia: Secondary | ICD-10-CM

## 2014-10-31 DIAGNOSIS — D45 Polycythemia vera: Secondary | ICD-10-CM

## 2014-10-31 NOTE — Telephone Encounter (Signed)
gv and printed appt sched anda vs for pt for March.... °

## 2014-10-31 NOTE — Progress Notes (Signed)
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  PRIOR THERAPY: None  CURRENT THERAPY: Phlebotomy on as-needed basis.  INTERVAL HISTORY: [name] 54 y.o. male returns to the clinic today for follow-up visit. The patient was seen for initial evaluation few weeks ago for persistent polycythemia. He had several studies performed since that time including JAK 2 mutation that was negative, serum erythropoietin was normal. He has normal serum ferritin of 249, serum iron 155, total iron binding capacity 353 and iron saturation 44%. The patient underwent phlebotomy on 10/09/2014. He is feeling fine today was no specific complaints. He denied having any significant chest pain, shortness breath, cough or hemoptysis. The patient denied having any significant weight loss or night sweats, no fever or chills. He has repeat CBC performed earlier today and he is here today for evaluation and discussion of his lab results and recommendation regarding his condition. CBC today showed hemoglobin of 18.0 g/dL the hematocrit was not calculated secondary to the hyperlipidemic condition of the serum. The patient has been off his cholesterol medicine for several days.   MEDICAL HISTORY: Past Medical History  Diagnosis Date  . Obesity   . Herpes labialis   . Seborrheic dermatitis   . Psoriasis   . Hypertension   . GERD (gastroesophageal reflux disease)   . Sleep apnea   . Arthritis   . Proteinuria   . Dyslipidemia   . TIA (transient ischemic attack)   . Metabolic syndrome   . Diverticulosis     ALLERGIES:  has No Known Allergies.  MEDICATIONS:  Current Outpatient Prescriptions  Medication Sig Dispense Refill  . Apremilast (OTEZLA) 30 MG TABS Take by mouth.    Marland Kitchen aspirin  81 MG tablet Take 81 mg by mouth daily.    . benazepril (LOTENSIN) 40 MG tablet Take 1 tablet (40 mg total) by mouth daily. 90 tablet 3  . Blood Glucose Monitoring Suppl (TRUERESULT BLOOD GLUCOSE) W/DEVICE KIT 1 each by Does not apply route 2 (two) times daily. 1 each 0  . clopidogrel (PLAVIX) 75 MG tablet TAKE 1 TABLET BY MOUTH DAILY. 90 tablet 3  . diltiazem (CARDIZEM CD) 240 MG 24 hr capsule TAKE 1 CAPSULE BY MOUTH DAILY. 90 capsule 3  . Glucose Blood (BLOOD GLUCOSE TEST STRIPS) STRP PATIENT IS TO TEST BID DX:E11.9 (THIS IS FOR THE TRUE RESULTS) 100 each 12  . HYDROcodone-acetaminophen (NORCO) 5-325 MG per tablet Take 1 tablet by mouth every 6 (six) hours as needed for moderate pain. 30 tablet 0  . LANCETS MICRO THIN 33G MISC PATIENT IS TO TEST BID USE AS DIRECTED DX:E11.9 (THIS IS FOR TRUE RESULT) 100 each 12  . metFORMIN (GLUCOPHAGE XR) 500 MG 24 hr tablet Take 1 tablet (500 mg total) by mouth 2 (two) times daily. 60 tablet 3  . pantoprazole (PROTONIX) 40 MG tablet TAKE 1 TABLET BY MOUTH DAILY. 90 tablet 3   No current facility-administered medications for this visit.    SURGICAL HISTORY:  Past Surgical History  Procedure Laterality Date  . Joint replacement  11/03/10    LT HIP  . Colonoscopy    . Tonsillectomy and adenoidectomy    . Wisdom tooth extraction    . Total hip arthroplasty Left     REVIEW OF SYSTEMS:  A comprehensive review of  systems was negative.   PHYSICAL EXAMINATION: General appearance: alert, cooperative and no distress Head: Normocephalic, without obvious abnormality, atraumatic Neck: no adenopathy, no JVD, supple, symmetrical, trachea midline and thyroid not enlarged, symmetric, no tenderness/mass/nodules Lymph nodes: Cervical, supraclavicular, and axillary nodes normal. Resp: clear to auscultation bilaterally Back: symmetric, no curvature. ROM normal. No CVA tenderness. Cardio: regular rate and rhythm, S1, S2 normal, no murmur, click, rub or gallop GI: soft,  non-tender; bowel sounds normal; no masses,  no organomegaly Extremities: extremities normal, atraumatic, no cyanosis or edema  ECOG PERFORMANCE STATUS: 0 - Asymptomatic  Blood pressure 157/84, pulse 76, temperature 97.8 F (36.6 C), temperature source Oral, resp. rate 20, height _0  (1.778 m), weight 277 lb 6.4 oz (125.828 kg), SpO2 99 %.  LABORATORY DATA: Lab Results  Component Value Date   WBC 7.1 10/09/2014   HGB 17.5* 10/09/2014   HCT 52.4* 10/09/2014   MCV 88.3 10/09/2014   PLT 208 10/09/2014      Chemistry      Component Value Date/Time   NA 136 10/09/2014 1057   NA 133* 09/17/2014 0906   K 4.2 10/09/2014 1057   K 4.1 09/17/2014 0906   CL 97 09/17/2014 0906   CO2 25 10/09/2014 1057   CO2 23 09/17/2014 0906   BUN 16.2 10/09/2014 1057   BUN 13 09/17/2014 0906   CREATININE 1.1 10/09/2014 1057   CREATININE 1.01 09/17/2014 0906   CREATININE 0.87 11/05/2010 0504      Component Value Date/Time   CALCIUM 9.6 10/09/2014 1057   CALCIUM 9.9 09/17/2014 0906   ALKPHOS 55 10/09/2014 1057   ALKPHOS 57 09/17/2014 0906   AST 22 10/09/2014 1057   AST 26 09/17/2014 0906   ALT 36 10/09/2014 1057   ALT 43 09/17/2014 0906   BILITOT 1.10 10/09/2014 1057   BILITOT 1.4* 09/17/2014 0906       RADIOGRAPHIC STUDIES: No results found.  ASSESSMENT AND PLAN: This is a very pleasant 54 years old white male with polycythemia highly suspicious for polycythemia vera but with negative JAK mutation. I discussed the lab result with the patient today. His hemoglobin is still elevated today. I recommended for the patient to proceed with phlebotomy today. I would see him back for follow-up visit in one month with repeat CBC and phlebotomy if needed. He was advised to contact his primary care physician for reevaluation and management of his hyper cholesterolemia. The patient voices understanding of current disease status and treatment options and is in agreement with the current care  plan.  All questions were answered. The patient knows to call the clinic with any problems, questions or concerns. We can certainly see the patient much sooner if necessary.   Disclaimer: This note was dictated with voice recognition software. Similar sounding words can inadvertently be transcribed and may not be corrected upon review.

## 2014-10-31 NOTE — Patient Instructions (Signed)

## 2014-10-31 NOTE — Progress Notes (Signed)
One unit of 500 mls completed per order. Pt able to eat snack, tolerated well.

## 2014-11-28 ENCOUNTER — Telehealth: Payer: Self-pay | Admitting: Internal Medicine

## 2014-11-28 ENCOUNTER — Ambulatory Visit (HOSPITAL_BASED_OUTPATIENT_CLINIC_OR_DEPARTMENT_OTHER): Payer: 59 | Admitting: Internal Medicine

## 2014-11-28 ENCOUNTER — Ambulatory Visit (HOSPITAL_BASED_OUTPATIENT_CLINIC_OR_DEPARTMENT_OTHER): Payer: 59

## 2014-11-28 ENCOUNTER — Telehealth: Payer: Self-pay | Admitting: *Deleted

## 2014-11-28 ENCOUNTER — Other Ambulatory Visit (HOSPITAL_BASED_OUTPATIENT_CLINIC_OR_DEPARTMENT_OTHER): Payer: 59

## 2014-11-28 ENCOUNTER — Encounter: Payer: Self-pay | Admitting: Internal Medicine

## 2014-11-28 VITALS — BP 146/89 | HR 77 | Temp 98.3°F | Resp 20 | Ht 70.0 in | Wt 277.8 lb

## 2014-11-28 DIAGNOSIS — D751 Secondary polycythemia: Secondary | ICD-10-CM

## 2014-11-28 DIAGNOSIS — D45 Polycythemia vera: Secondary | ICD-10-CM

## 2014-11-28 LAB — CBC WITH DIFFERENTIAL/PLATELET
BASO%: 0.3 % (ref 0.0–2.0)
BASOS ABS: 0 10*3/uL (ref 0.0–0.1)
EOS ABS: 0.1 10*3/uL (ref 0.0–0.5)
EOS%: 1.8 % (ref 0.0–7.0)
HCT: 48.7 % (ref 38.4–49.9)
HGB: 17.7 g/dL — ABNORMAL HIGH (ref 13.0–17.1)
LYMPH#: 2.2 10*3/uL (ref 0.9–3.3)
LYMPH%: 35.3 % (ref 14.0–49.0)
MCH: 31.2 pg (ref 27.2–33.4)
MCHC: 36.3 g/dL — ABNORMAL HIGH (ref 32.0–36.0)
MCV: 85.7 fL (ref 79.3–98.0)
MONO#: 0.4 10*3/uL (ref 0.1–0.9)
MONO%: 6.9 % (ref 0.0–14.0)
NEUT#: 3.4 10*3/uL (ref 1.5–6.5)
NEUT%: 55.7 % (ref 39.0–75.0)
PLATELETS: 196 10*3/uL (ref 140–400)
RBC: 5.68 10*6/uL (ref 4.20–5.82)
RDW: 13.7 % (ref 11.0–14.6)
WBC: 6.1 10*3/uL (ref 4.0–10.3)

## 2014-11-28 NOTE — Patient Instructions (Signed)

## 2014-11-28 NOTE — Progress Notes (Signed)
Pt tolerated phlebotomy well. Drank 2 glasses of water and would only stay for 20 minutes after end of phlebotomy.

## 2014-11-28 NOTE — Telephone Encounter (Signed)
Pt confirmed labs/ov per 03/30 POF, gave pt AVS and Calendar.... KJ, sent msg to add chemo °

## 2014-11-28 NOTE — Progress Notes (Signed)
    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  PRIOR THERAPY: None  CURRENT THERAPY: Phlebotomy on as-needed basis.  INTERVAL HISTORY: [name] 54 y.o. male returns to the clinic today for follow-up visit. He is currently on phlebotomy on as-needed basis and received 2 phlebotomies in the last 2 months. He is feeling fine today was no specific complaints. He denied having any significant chest pain, shortness breath, cough or hemoptysis. The patient denied having any significant weight loss or night sweats, no fever or chills. He has repeat CBC performed earlier today and he is here today for evaluation and discussion of his lab results and recommendation regarding his condition.   MEDICAL HISTORY: Past Medical History  Diagnosis Date  . Obesity   . Herpes labialis   . Seborrheic dermatitis   . Psoriasis   . Hypertension   . GERD (gastroesophageal reflux disease)   . Sleep apnea   . Arthritis   . Proteinuria   . Dyslipidemia   . TIA (transient ischemic attack)   . Metabolic syndrome   . Diverticulosis     ALLERGIES:  has No Known Allergies.  MEDICATIONS:  Current Outpatient Prescriptions  Medication Sig Dispense Refill  . Apremilast (OTEZLA) 30 MG TABS Take by mouth.    . aspirin 81 MG tablet Take 81 mg by mouth daily.    . benazepril (LOTENSIN) 40 MG tablet Take 1 tablet (40 mg total) by mouth daily. 90 tablet 3  . Blood Glucose Monitoring Suppl (TRUERESULT BLOOD GLUCOSE) W/DEVICE KIT 1 each by Does not apply route 2 (two) times daily. 1 each 0  . clopidogrel (PLAVIX) 75 MG tablet TAKE 1 TABLET BY MOUTH DAILY. 90 tablet 3  . diltiazem (CARDIZEM CD) 240 MG 24 hr capsule TAKE 1 CAPSULE BY MOUTH DAILY. 90 capsule 3  . Glucose Blood (BLOOD  GLUCOSE TEST STRIPS) STRP PATIENT IS TO TEST BID DX:E11.9 (THIS IS FOR THE TRUE RESULTS) 100 each 12  . HYDROcodone-acetaminophen (NORCO) 5-325 MG per tablet Take 1 tablet by mouth every 6 (six) hours as needed for moderate pain. 30 tablet 0  . LANCETS MICRO THIN 33G MISC PATIENT IS TO TEST BID USE AS DIRECTED DX:E11.9 (THIS IS FOR TRUE RESULT) 100 each 12  . metFORMIN (GLUCOPHAGE XR) 500 MG 24 hr tablet Take 1 tablet (500 mg total) by mouth 2 (two) times daily. 60 tablet 3  . pantoprazole (PROTONIX) 40 MG tablet TAKE 1 TABLET BY MOUTH DAILY. 90 tablet 3   No current facility-administered medications for this visit.    SURGICAL HISTORY:  Past Surgical History  Procedure Laterality Date  . Joint replacement  11/03/10    LT HIP  . Colonoscopy    . Tonsillectomy and adenoidectomy    . Wisdom tooth extraction    . Total hip arthroplasty Left     REVIEW OF SYSTEMS:  A comprehensive review of systems was negative.   PHYSICAL EXAMINATION: General appearance: alert, cooperative and no distress Head: Normocephalic, without obvious abnormality, atraumatic Neck: no adenopathy, no JVD, supple, symmetrical, trachea midline and thyroid not enlarged, symmetric, no tenderness/mass/nodules Lymph nodes: Cervical, supraclavicular, and axillary nodes normal. Resp: clear to auscultation bilaterally Back: symmetric, no curvature. ROM normal. No CVA tenderness. Cardio: regular rate and rhythm, S1, S2 normal, no murmur, click, rub or   gallop GI: soft, non-tender; bowel sounds normal; no masses,  no organomegaly Extremities: extremities normal, atraumatic, no cyanosis or edema  ECOG PERFORMANCE STATUS: 0 - Asymptomatic  There were no vitals taken for this visit.  LABORATORY DATA: Lab Results  Component Value Date   WBC 6.1 11/28/2014   HGB 17.7* 11/28/2014   HCT 48.7 11/28/2014   MCV 85.7 11/28/2014   PLT 196 11/28/2014      Chemistry      Component Value Date/Time   NA 136 10/09/2014 1057   NA  133* 09/17/2014 0906   K 4.2 10/09/2014 1057   K 4.1 09/17/2014 0906   CL 97 09/17/2014 0906   CO2 25 10/09/2014 1057   CO2 23 09/17/2014 0906   BUN 16.2 10/09/2014 1057   BUN 13 09/17/2014 0906   CREATININE 1.1 10/09/2014 1057   CREATININE 1.01 09/17/2014 0906   CREATININE 0.87 11/05/2010 0504      Component Value Date/Time   CALCIUM 9.6 10/09/2014 1057   CALCIUM 9.9 09/17/2014 0906   ALKPHOS 55 10/09/2014 1057   ALKPHOS 57 09/17/2014 0906   AST 22 10/09/2014 1057   AST 26 09/17/2014 0906   ALT 36 10/09/2014 1057   ALT 43 09/17/2014 0906   BILITOT 1.10 10/09/2014 1057   BILITOT 1.4* 09/17/2014 0906       RADIOGRAPHIC STUDIES: No results found.  ASSESSMENT AND PLAN: This is a very pleasant 54 years old white male with polycythemia highly suspicious for polycythemia vera but with negative JAK mutation. His hematocrit today is 48.7%. I discussed the lab result with the patient today. I recommended for the patient to proceed with phlebotomy today. I would see him back for follow-up visit in one month with repeat CBC, comprehensive metabolic panel, LDH as well as JAK 2 exon 12, MPL515 mutation and BCR/ABL. I will also consider the patient for phlebotomy if needed at that time. He was advised to contact his primary care physician for reevaluation and management of his hypercholesterolemia. The patient voices understanding of current disease status and treatment options and is in agreement with the current care plan.  All questions were answered. The patient knows to call the clinic with any problems, questions or concerns. We can certainly see the patient much sooner if necessary.   Disclaimer: This note was dictated with voice recognition software. Similar sounding words can inadvertently be transcribed and may not be corrected upon review.

## 2014-11-28 NOTE — Telephone Encounter (Signed)
Per staff message and POF I have scheduled appts. Advised scheduler of appts. JMW  

## 2014-12-05 ENCOUNTER — Telehealth: Payer: Self-pay | Admitting: Family Medicine

## 2014-12-05 ENCOUNTER — Encounter: Payer: Self-pay | Admitting: Medical

## 2014-12-05 ENCOUNTER — Ambulatory Visit (INDEPENDENT_AMBULATORY_CARE_PROVIDER_SITE_OTHER): Payer: 59 | Admitting: Medical

## 2014-12-05 ENCOUNTER — Telehealth: Payer: Self-pay | Admitting: Internal Medicine

## 2014-12-05 VITALS — BP 122/80 | HR 72 | Resp 15 | Wt 277.0 lb

## 2014-12-05 DIAGNOSIS — I10 Essential (primary) hypertension: Secondary | ICD-10-CM | POA: Diagnosis not present

## 2014-12-05 DIAGNOSIS — G4733 Obstructive sleep apnea (adult) (pediatric): Secondary | ICD-10-CM

## 2014-12-05 DIAGNOSIS — Z9989 Dependence on other enabling machines and devices: Secondary | ICD-10-CM

## 2014-12-05 NOTE — Telephone Encounter (Signed)
s.w. pt and advised on 4.27 appt moved to 5.3 due to MD on pal.....pt ok and aware.. °

## 2014-12-05 NOTE — Progress Notes (Signed)
  Subjective:  [name] is a 54 y.o. 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 He did see Dr. Redmond School in January for follow-up.  He has been working on diet and exercise, home blood pressure readings range from normal to up to 979 systolic, 48A diastolic.  Overall feels fine. compliant with all medications.  No other c/o.  The following portions of the patient's history were reviewed and updated as appropriate: allergies, current medications, past family history, past medical history, past social history, past surgical history and problem list.  ROS Otherwise as in subjective above  Objective: Physical Exam  BP 122/80 mmHg  Pulse 72  Resp 15  Wt 277 lb (125.646 kg)  General appearance: alert, no distress, WD/WN Neck: supple, no lymphadenopathy, no thyromegaly, no masses Heart: RRR, normal S1, S2, no murmurs Lungs: CTA bilaterally, no wheezes, rhonchi, or rales Pulses: 2+ radial pulses, 2+ pedal pulses, normal cap refill Ext: no edema   Assessment: Encounter Diagnoses  Name Primary?  . Essential hypertension Yes  . OSA on CPAP      Plan: I reviewed his January visit with Dr. Redmond School here, reviewed my DOT visit in January as well, I reviewed recent compliance reports and he is compliant, blood pressure today looks fine.  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  Advise he continue to monitor blood pressures as he does, and goal was to be in the 165 to 537 systolic range.  Advised that if he is routinely well above 482 systolic blood pressures then recheck to discuss modification of his blood pressure medications, possibly adding beta blocker or hydrochlorothiazide.  For now continue current medications, healthy lifestyle, and follow-up  in January 2017 for repeat DOT certification

## 2014-12-05 NOTE — Telephone Encounter (Signed)
Pt dropped off [facility] that [facility]. These labs are in his chart dated 11/28/2014. Apparently some of his levels are not good. I am sending back a copy of these labs but they are available in pt's chart too.

## 2014-12-12 ENCOUNTER — Other Ambulatory Visit: Payer: Self-pay | Admitting: Family Medicine

## 2014-12-24 ENCOUNTER — Other Ambulatory Visit: Payer: Self-pay | Admitting: Family Medicine

## 2014-12-26 ENCOUNTER — Other Ambulatory Visit: Payer: 59

## 2014-12-26 ENCOUNTER — Ambulatory Visit: Payer: 59 | Admitting: Internal Medicine

## 2015-01-01 ENCOUNTER — Other Ambulatory Visit: Payer: 59

## 2015-01-01 ENCOUNTER — Ambulatory Visit: Payer: 59 | Admitting: Internal Medicine

## 2015-01-14 ENCOUNTER — Telehealth: Payer: Self-pay | Admitting: Family Medicine

## 2015-01-14 NOTE — Telephone Encounter (Signed)
Referred pt to Dr. Foye Deer [facility] [phone]  today 2:30 per Dr Redmond School.  Called pt reached voice mail lmtrc and advised of appt.

## 2015-01-16 ENCOUNTER — Ambulatory Visit (INDEPENDENT_AMBULATORY_CARE_PROVIDER_SITE_OTHER): Payer: 59 | Admitting: Family Medicine

## 2015-01-16 ENCOUNTER — Encounter: Payer: Self-pay | Admitting: Family Medicine

## 2015-01-16 VITALS — BP 130/90 | HR 85 | Wt 280.6 lb

## 2015-01-16 DIAGNOSIS — D45 Polycythemia vera: Secondary | ICD-10-CM | POA: Diagnosis not present

## 2015-01-16 DIAGNOSIS — E669 Obesity, unspecified: Secondary | ICD-10-CM

## 2015-01-16 DIAGNOSIS — E119 Type 2 diabetes mellitus without complications: Secondary | ICD-10-CM

## 2015-01-16 DIAGNOSIS — I152 Hypertension secondary to endocrine disorders: Secondary | ICD-10-CM

## 2015-01-16 DIAGNOSIS — G4733 Obstructive sleep apnea (adult) (pediatric): Secondary | ICD-10-CM | POA: Diagnosis not present

## 2015-01-16 DIAGNOSIS — I10 Essential (primary) hypertension: Secondary | ICD-10-CM | POA: Diagnosis not present

## 2015-01-16 DIAGNOSIS — E1169 Type 2 diabetes mellitus with other specified complication: Secondary | ICD-10-CM

## 2015-01-16 DIAGNOSIS — L400 Psoriasis vulgaris: Secondary | ICD-10-CM | POA: Diagnosis not present

## 2015-01-16 DIAGNOSIS — E785 Hyperlipidemia, unspecified: Secondary | ICD-10-CM | POA: Diagnosis not present

## 2015-01-16 DIAGNOSIS — E1159 Type 2 diabetes mellitus with other circulatory complications: Secondary | ICD-10-CM

## 2015-01-16 LAB — POCT GLYCOSYLATED HEMOGLOBIN (HGB A1C): Hemoglobin A1C: 5.5

## 2015-01-16 NOTE — Progress Notes (Signed)
   Subjective:    Patient ID: [name], male    DOB: 06/10/1961, 54 y.o.   MRN: [id number]  HPI He is here for a diabetes recheck. He does check his blood sugars usually in the morning and they run usually less than 140. He has relaxed his dietary regimen still keeps himself busy physically. He does have OSA and uses his CPAP. He is followed by dermatology for his plaque psoriasis and is on a DMARD he continues on his blood pressure medications as he is not taking a Statin. He is followed By Dr. Julien Nordmann for his polycythemia he does get periodic blood draws. He does check his feet periodically especially in regard to his plaque psoriasis.   Review of Systems     Objective:   Physical Exam Alert and in no distress. Hemoglobin A1c is 5.5       Assessment & Plan:  Type 2 diabetes mellitus without complication - Plan: HgB A1c  Polycythemia vera  Plaque psoriasis  Obstructive sleep apnea  Obesity (BMI 30-39.9)  Hypertension associated with diabetes  Hyperlipidemia associated with type 2 diabetes mellitus I encouraged him to work further on his diet. He has gained some weight. Discussed the fact that if he gets down close to 250 lbs, I will consider stopping the metformin. Also recommend he check his blood sugars 2 hours after some of his meals to get immediate feedback.

## 2015-01-18 ENCOUNTER — Other Ambulatory Visit: Payer: Self-pay | Admitting: Family Medicine

## 2015-01-18 MED ORDER — ROSUVASTATIN CALCIUM 10 MG PO TABS: 10.0000 mg | ORAL_TABLET | Freq: Every day | ORAL | Status: DC

## 2015-01-18 NOTE — Telephone Encounter (Signed)
Per Dr. Redmond School verbal orders I sent Crestor 10 mg # 90 to the patients pharmacy.

## 2015-01-24 ENCOUNTER — Ambulatory Visit (HOSPITAL_BASED_OUTPATIENT_CLINIC_OR_DEPARTMENT_OTHER): Payer: 59 | Admitting: Internal Medicine

## 2015-01-24 ENCOUNTER — Telehealth: Payer: Self-pay | Admitting: Internal Medicine

## 2015-01-24 ENCOUNTER — Encounter: Payer: Self-pay | Admitting: Internal Medicine

## 2015-01-24 ENCOUNTER — Telehealth: Payer: Self-pay | Admitting: *Deleted

## 2015-01-24 ENCOUNTER — Ambulatory Visit (HOSPITAL_BASED_OUTPATIENT_CLINIC_OR_DEPARTMENT_OTHER): Payer: 59

## 2015-01-24 ENCOUNTER — Other Ambulatory Visit (HOSPITAL_BASED_OUTPATIENT_CLINIC_OR_DEPARTMENT_OTHER): Payer: 59

## 2015-01-24 VITALS — BP 145/80 | HR 66 | Temp 98.5°F | Resp 19 | Ht 70.0 in | Wt 280.6 lb

## 2015-01-24 DIAGNOSIS — D45 Polycythemia vera: Secondary | ICD-10-CM

## 2015-01-24 DIAGNOSIS — D751 Secondary polycythemia: Secondary | ICD-10-CM | POA: Diagnosis not present

## 2015-01-24 LAB — CBC WITH DIFFERENTIAL/PLATELET
BASO%: 1 % (ref 0.0–2.0)
Basophils Absolute: 0.1 10*3/uL (ref 0.0–0.1)
EOS%: 2.1 % (ref 0.0–7.0)
Eosinophils Absolute: 0.1 10*3/uL (ref 0.0–0.5)
HEMATOCRIT: 51.4 % — AB (ref 38.4–49.9)
HGB: 17.8 g/dL — ABNORMAL HIGH (ref 13.0–17.1)
LYMPH%: 26.4 % (ref 14.0–49.0)
MCH: 30.1 pg (ref 27.2–33.4)
MCHC: 34.6 g/dL (ref 32.0–36.0)
MCV: 86.8 fL (ref 79.3–98.0)
MONO#: 0.5 10*3/uL (ref 0.1–0.9)
MONO%: 7.6 % (ref 0.0–14.0)
NEUT#: 4.1 10*3/uL (ref 1.5–6.5)
NEUT%: 62.9 % (ref 39.0–75.0)
Platelets: 195 10*3/uL (ref 140–400)
RBC: 5.92 10*6/uL — AB (ref 4.20–5.82)
RDW: 13.9 % (ref 11.0–14.6)
WBC: 6.4 10*3/uL (ref 4.0–10.3)
lymph#: 1.7 10*3/uL (ref 0.9–3.3)

## 2015-01-24 LAB — COMPREHENSIVE METABOLIC PANEL (CC13)
ALT: 33 U/L (ref 0–55)
ANION GAP: 12 meq/L — AB (ref 3–11)
AST: 25 U/L (ref 5–34)
Albumin: 3.9 g/dL (ref 3.5–5.0)
Alkaline Phosphatase: 58 U/L (ref 40–150)
BUN: 16.1 mg/dL (ref 7.0–26.0)
CHLORIDE: 104 meq/L (ref 98–109)
CO2: 21 mEq/L — ABNORMAL LOW (ref 22–29)
CREATININE: 1.1 mg/dL (ref 0.7–1.3)
Calcium: 8.8 mg/dL (ref 8.4–10.4)
EGFR: 79 mL/min/{1.73_m2} — ABNORMAL LOW (ref >90)
Glucose: 160 mg/dl — ABNORMAL HIGH (ref 70–140)
POTASSIUM: 3.9 meq/L (ref 3.5–5.1)
Sodium: 137 mEq/L (ref 136–145)
TOTAL PROTEIN: 6.7 g/dL (ref 6.4–8.3)
Total Bilirubin: 1.33 mg/dL — ABNORMAL HIGH (ref 0.20–1.20)

## 2015-01-24 LAB — LACTATE DEHYDROGENASE (CC13): LDH: 139 U/L (ref 125–245)

## 2015-01-24 NOTE — Progress Notes (Signed)
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  PRIOR THERAPY: None  CURRENT THERAPY: Phlebotomy on as-needed basis.  INTERVAL HISTORY: [name] 54 y.o. male returns to the clinic today for follow-up visit. He is currently on phlebotomy on as-needed basis and tolerated it well. He is feeling fine today with no specific complaints. He denied having any significant chest pain, shortness of breath, cough or hemoptysis. The patient denied having any significant weight loss or night sweats, no fever or chills. He has repeat CBC performed earlier today and he is here today for evaluation and discussion of his lab results and recommendation regarding his condition.   MEDICAL HISTORY: Past Medical History  Diagnosis Date  . Obesity   . Herpes labialis   . Seborrheic dermatitis   . Psoriasis   . Hypertension   . GERD (gastroesophageal reflux disease)   . Sleep apnea   . Arthritis   . Proteinuria   . Dyslipidemia   . TIA (transient ischemic attack)   . Metabolic syndrome   . Diverticulosis     ALLERGIES:  has No Known Allergies.  MEDICATIONS:  Current Outpatient Prescriptions  Medication Sig Dispense Refill  . Apremilast (OTEZLA) 30 MG TABS Take by mouth.    Marland Kitchen aspirin 81 MG tablet Take 81 mg by mouth daily.    . benazepril (LOTENSIN) 40 MG tablet TAKE 1 TABLET BY MOUTH DAILY 90 tablet 0  . Blood Glucose Monitoring Suppl (TRUERESULT BLOOD GLUCOSE) W/DEVICE KIT 1 each by Does not apply route 2 (two) times daily. 1 each 0  . CARTIA XT 240 MG 24 hr capsule TAKE 1 CAPSULE BY MOUTH DAILY. 90 capsule 0  . clopidogrel (PLAVIX) 75 MG tablet TAKE 1 TABLET BY MOUTH DAILY. 90 tablet 3  . Glucose Blood (BLOOD GLUCOSE TEST STRIPS) STRP PATIENT IS TO TEST BID  DX:E11.9 (THIS IS FOR THE TRUE RESULTS) 100 each 12  . LANCETS MICRO THIN 33G MISC PATIENT IS TO TEST BID USE AS DIRECTED DX:E11.9 (THIS IS FOR TRUE RESULT) 100 each 12  . metFORMIN (GLUCOPHAGE-XR) 500 MG 24 hr tablet TAKE 1 TABLET BY MOUTH 2 TIMES DAILY. 60 tablet 0  . pantoprazole (PROTONIX) 40 MG tablet TAKE 1 TABLET BY MOUTH DAILY. 90 tablet 3  . rosuvastatin (CRESTOR) 10 MG tablet Take 1 tablet (10 mg total) by mouth daily. 90 tablet 0  . HYDROcodone-acetaminophen (NORCO) 5-325 MG per tablet Take 1 tablet by mouth every 6 (six) hours as needed for moderate pain. (Patient not taking: Reported on 01/24/2015) 30 tablet 0   No current facility-administered medications for this visit.    SURGICAL HISTORY:  Past Surgical History  Procedure Laterality Date  . Joint replacement  11/03/10    LT HIP  . Colonoscopy    . Tonsillectomy and adenoidectomy    . Wisdom tooth extraction    . Total hip arthroplasty Left     REVIEW OF SYSTEMS:  A comprehensive review of systems was negative.   PHYSICAL EXAMINATION: General appearance: alert, cooperative and no distress Head: Normocephalic, without obvious abnormality, atraumatic Neck: no adenopathy, no JVD, supple, symmetrical, trachea midline and thyroid not enlarged, symmetric, no tenderness/mass/nodules Lymph nodes: Cervical, supraclavicular, and axillary nodes normal. Resp: clear to auscultation bilaterally Back: symmetric, no curvature. ROM normal. No CVA tenderness. Cardio:  regular rate and rhythm, S1, S2 normal, no murmur, click, rub or gallop GI: soft, non-tender; bowel sounds normal; no masses,  no organomegaly Extremities: extremities normal, atraumatic, no cyanosis or edema  ECOG PERFORMANCE STATUS: 0 - Asymptomatic  Blood pressure 145/80, pulse 66, temperature 98.5 F (36.9 C), temperature source Oral, resp. rate 19, height 5' 10"  (1.778 m), weight 280 lb 9.6 oz (127.279 kg), SpO2 99 %.  LABORATORY DATA: Lab Results  Component Value  Date   WBC 6.4 01/24/2015   HGB 17.8* 01/24/2015   HCT 51.4* 01/24/2015   MCV 86.8 01/24/2015   PLT 195 01/24/2015      Chemistry      Component Value Date/Time   NA 136 10/09/2014 1057   NA 133* 09/17/2014 0906   K 4.2 10/09/2014 1057   K 4.1 09/17/2014 0906   CL 97 09/17/2014 0906   CO2 25 10/09/2014 1057   CO2 23 09/17/2014 0906   BUN 16.2 10/09/2014 1057   BUN 13 09/17/2014 0906   CREATININE 1.1 10/09/2014 1057   CREATININE 1.01 09/17/2014 0906   CREATININE 0.87 11/05/2010 0504      Component Value Date/Time   CALCIUM 9.6 10/09/2014 1057   CALCIUM 9.9 09/17/2014 0906   ALKPHOS 55 10/09/2014 1057   ALKPHOS 57 09/17/2014 0906   AST 22 10/09/2014 1057   AST 26 09/17/2014 0906   ALT 36 10/09/2014 1057   ALT 43 09/17/2014 0906   BILITOT 1.10 10/09/2014 1057   BILITOT 1.4* 09/17/2014 0906       RADIOGRAPHIC STUDIES: No results found.  ASSESSMENT AND PLAN: This is a very pleasant 54 years old white male with polycythemia highly suspicious for polycythemia vera but with negative JAK mutation. His hematocrit today is 51.4%. I discussed the lab result with the patient today. I recommended for the patient to proceed with phlebotomy today. He will come back for follow-up visit in 2 weeks with repeat CBC and phlebotomy if needed. He had several studies performed earlier today including repeat CBC, comprehensive metabolic panel, LDH as well as JAK 2 exon 12, MPL515 mutation and BCR/ABL. For the dyslipidemia, the patient is currently on treatment with Crestor by his primary care physician. The patient voices understanding of current disease status and treatment options and is in agreement with the current care plan.  All questions were answered. The patient knows to call the clinic with any problems, questions or concerns. We can certainly see the patient much sooner if necessary.   Disclaimer: This note was dictated with voice recognition software. Similar sounding words can  inadvertently be transcribed and may not be corrected upon review.

## 2015-01-24 NOTE — Telephone Encounter (Signed)
Per staff message and POF I have scheduled appts. Advised scheduler of appts. JMW  

## 2015-01-24 NOTE — Progress Notes (Signed)
Phlebotomy performed via right AC.  500g removed for duration of 7 minutes.  Pt given water and stayed for 30 minute observation period.  Vitals stable and pt ambulated out.

## 2015-01-24 NOTE — Telephone Encounter (Signed)
Pt confirmed labs/ov per 05/26 POF, gave pt AVS and Calendar..... KJ, sent msg to add Phlebotomy

## 2015-02-07 ENCOUNTER — Encounter: Payer: Self-pay | Admitting: Physician Assistant

## 2015-02-07 ENCOUNTER — Ambulatory Visit (HOSPITAL_BASED_OUTPATIENT_CLINIC_OR_DEPARTMENT_OTHER): Payer: 59 | Admitting: Physician Assistant

## 2015-02-07 ENCOUNTER — Ambulatory Visit (HOSPITAL_BASED_OUTPATIENT_CLINIC_OR_DEPARTMENT_OTHER): Payer: 59

## 2015-02-07 ENCOUNTER — Other Ambulatory Visit (HOSPITAL_BASED_OUTPATIENT_CLINIC_OR_DEPARTMENT_OTHER): Payer: 59

## 2015-02-07 ENCOUNTER — Other Ambulatory Visit: Payer: Self-pay | Admitting: *Deleted

## 2015-02-07 VITALS — BP 162/83 | HR 70 | Temp 98.6°F | Resp 18 | Ht 70.0 in | Wt 278.9 lb

## 2015-02-07 DIAGNOSIS — D45 Polycythemia vera: Secondary | ICD-10-CM

## 2015-02-07 DIAGNOSIS — D751 Secondary polycythemia: Secondary | ICD-10-CM | POA: Diagnosis not present

## 2015-02-07 LAB — CBC WITH DIFFERENTIAL/PLATELET
BASO%: 1.1 % (ref 0.0–2.0)
BASOS ABS: 0.1 10*3/uL (ref 0.0–0.1)
EOS%: 1.6 % (ref 0.0–7.0)
Eosinophils Absolute: 0.1 10*3/uL (ref 0.0–0.5)
HEMATOCRIT: 49.5 % (ref 38.4–49.9)
HGB: 17.4 g/dL — ABNORMAL HIGH (ref 13.0–17.1)
LYMPH#: 4.1 10*3/uL — AB (ref 0.9–3.3)
LYMPH%: 44.6 % (ref 14.0–49.0)
MCH: 30.2 pg (ref 27.2–33.4)
MCHC: 35.2 g/dL (ref 32.0–36.0)
MCV: 85.7 fL (ref 79.3–98.0)
MONO#: 0.7 10*3/uL (ref 0.1–0.9)
MONO%: 7.2 % (ref 0.0–14.0)
NEUT#: 4.2 10*3/uL (ref 1.5–6.5)
NEUT%: 45.5 % (ref 39.0–75.0)
PLATELETS: 210 10*3/uL (ref 140–400)
RBC: 5.77 10*6/uL (ref 4.20–5.82)
RDW: 14 % (ref 11.0–14.6)
WBC: 9.2 10*3/uL (ref 4.0–10.3)

## 2015-02-07 NOTE — Patient Instructions (Signed)

## 2015-02-07 NOTE — Progress Notes (Signed)
Therapeutic Phlebotomy performed via Phlebotomy set in Right AC starting at 1431 and ending at 1438 . 514 cc removed. Pt tolerated well, drink provided  Pt monitored for 30 mins post-procedure. Vital signs and patient stable at time of discharge.

## 2015-02-07 NOTE — Progress Notes (Addendum)
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  PRIOR THERAPY: None  CURRENT THERAPY: Phlebotomy on as-needed basis.  INTERVAL HISTORY: [name] 54 y.o. male returns to the clinic today for follow-up visit. He is currently on phlebotomy on as-needed basis and overall tolerating it well. He does report however after his last phlebotomy 2 weeks ago that he went out and played golf in the heat. This turned out to not be such a great idea as he had nausea, vomiting and diarrhea afterwards. He has moved his phlebotomy appointments to the afternoon and is making sure that he hydrates well both before and after. He states that he was recently put back on both Plavix) Crestor by his primary care physician Dr. Redmond School. He complains of body aches particularly the low back and calf area. He feels that the these body aches related to these medications. He has psoriasis and is currently on Otesla. He states that he was on a clinical trial for a Lilly medication that was an injectable, that could be injected at home. He is waiting on the availability of this medication as his psoriasis was much better controlled on medication. He is feeling fine today with no specific complaints. He denied having any significant chest pain, shortness of breath, cough or hemoptysis. The patient denied having any significant weight loss or night sweats, no fever or chills. He has repeat CBC performed earlier today and he is here today for evaluation and discussion of his lab results and recommendation regarding his condition.   MEDICAL HISTORY: Past Medical History  Diagnosis Date  . Obesity   . Herpes labialis   . Seborrheic dermatitis   . Psoriasis   . Hypertension   . GERD  (gastroesophageal reflux disease)   . Sleep apnea   . Arthritis   . Proteinuria   . Dyslipidemia   . TIA (transient ischemic attack)   . Metabolic syndrome   . Diverticulosis     ALLERGIES:  has No Known Allergies.  MEDICATIONS:  Current Outpatient Prescriptions  Medication Sig Dispense Refill  . Apremilast (OTEZLA) 30 MG TABS Take by mouth.    Marland Kitchen aspirin 81 MG tablet Take 81 mg by mouth daily.    . benazepril (LOTENSIN) 40 MG tablet TAKE 1 TABLET BY MOUTH DAILY 90 tablet 0  . Blood Glucose Monitoring Suppl (TRUERESULT BLOOD GLUCOSE) W/DEVICE KIT 1 each by Does not apply route 2 (two) times daily. 1 each 0  . CARTIA XT 240 MG 24 hr capsule TAKE 1 CAPSULE BY MOUTH DAILY. 90 capsule 0  . clopidogrel (PLAVIX) 75 MG tablet TAKE 1 TABLET BY MOUTH DAILY. 90 tablet 3  . Glucose Blood (BLOOD GLUCOSE TEST STRIPS) STRP PATIENT IS TO TEST BID DX:E11.9 (THIS IS FOR THE TRUE RESULTS) 100 each 12  . HYDROcodone-acetaminophen (NORCO) 5-325 MG per tablet Take 1 tablet by mouth every 6 (six) hours as needed for moderate pain. 30 tablet 0  . LANCETS MICRO THIN 33G MISC PATIENT IS TO TEST BID USE AS DIRECTED DX:E11.9 (THIS IS FOR TRUE RESULT) 100 each 12  . metFORMIN (GLUCOPHAGE-XR) 500 MG 24 hr tablet TAKE 1 TABLET BY MOUTH 2 TIMES DAILY. 60 tablet 0  . pantoprazole (PROTONIX) 40 MG tablet TAKE 1 TABLET BY MOUTH DAILY. 90 tablet 3  .  rosuvastatin (CRESTOR) 10 MG tablet Take 1 tablet (10 mg total) by mouth daily. 90 tablet 0   No current facility-administered medications for this visit.    SURGICAL HISTORY:  Past Surgical History  Procedure Laterality Date  . Joint replacement  11/03/10    LT HIP  . Colonoscopy    . Tonsillectomy and adenoidectomy    . Wisdom tooth extraction    . Total hip arthroplasty Left     REVIEW OF SYSTEMS:  A comprehensive review of systems was negative.   PHYSICAL EXAMINATION: General appearance: alert, cooperative and no distress Head: Normocephalic, without obvious  abnormality, atraumatic Neck: no adenopathy, no JVD, supple, symmetrical, trachea midline and thyroid not enlarged, symmetric, no tenderness/mass/nodules Lymph nodes: Cervical, supraclavicular, and axillary nodes normal. Resp: clear to auscultation bilaterally Back: symmetric, no curvature. ROM normal. No CVA tenderness. Cardio: regular rate and rhythm, S1, S2 normal, no murmur, click, rub or gallop GI: soft, non-tender; bowel sounds normal; no masses,  no organomegaly Extremities: extremities normal, atraumatic, no cyanosis or edema  ECOG PERFORMANCE STATUS: 0 - Asymptomatic  Blood pressure 162/83, pulse 70, temperature 98.6 F (37 C), temperature source Oral, resp. rate 18, height _0  (1.778 m), weight 278 lb 14.4 oz (126.508 kg), SpO2 98 %.  LABORATORY DATA: Lab Results  Component Value Date   WBC 9.2 02/07/2015   HGB 17.4* 02/07/2015   HCT 49.5 02/07/2015   MCV 85.7 02/07/2015   PLT 210 02/07/2015      Chemistry      Component Value Date/Time   NA 137 01/24/2015 0826   NA 133* 09/17/2014 0906   K 3.9 01/24/2015 0826   K 4.1 09/17/2014 0906   CL 97 09/17/2014 0906   CO2 21* 01/24/2015 0826   CO2 23 09/17/2014 0906   BUN 16.1 01/24/2015 0826   BUN 13 09/17/2014 0906   CREATININE 1.1 01/24/2015 0826   CREATININE 1.01 09/17/2014 0906   CREATININE 0.87 11/05/2010 0504      Component Value Date/Time   CALCIUM 8.8 01/24/2015 0826   CALCIUM 9.9 09/17/2014 0906   ALKPHOS 58 01/24/2015 0826   ALKPHOS 57 09/17/2014 0906   AST 25 01/24/2015 0826   AST 26 09/17/2014 0906   ALT 33 01/24/2015 0826   ALT 43 09/17/2014 0906   BILITOT 1.33* 01/24/2015 0826   BILITOT 1.4* 09/17/2014 0906       RADIOGRAPHIC STUDIES: No results found.  ASSESSMENT AND PLAN: This is a very pleasant 53 years old white male with polycythemia highly suspicious for polycythemia vera but with negative JAK mutation. His hematocrit today is 49.5%. I reviewed lab result with the patient today. He  will require phlebotomy today as scheduled. The patient was discussed with and also seen by Dr. Julien Nordmann. The specialized studies, JAK 2 exon 12, MPL515 mutation and BCR/ABL, or all negative and these results were discussed with the patient. He will continue with repeat lab and phlebotomy every 2 weeks as scheduled and follow-up with Dr. Julien Nordmann as previously scheduled 03/21/2015. Regarding his body aches, he is advised to contact Dr. Tommie Sams see there is anything else he can be gone for his cholesterol. Apparently this cholesterol issue is primarily high triglycerides and may benefit from niacin with less body aches that may have flushing. He is encouraged to stay well-hydrated particularly when he is out in the sun. The patient voices understanding of current disease status and treatment options and is in agreement with the current care plan.  All questions  were answered. The patient knows to call the clinic with any problems, questions or concerns. We can certainly see the patient much sooner if necessary.  Carlton Adam, PA-C 02/07/2015   ADDENDUM: Hematology/Oncology Attending: I had a face to face encounter with the patient. I recommended his care plan. This is a very pleasant 54 years old white male with persistent polycythemia highly suspicious for polycythemia vera. The molecular studies are negative but they are not 100% sensitive. I recommended for the patient to continue with phlebotomy at regular basis to keep his hematocrit around 45%. He will proceed with phlebotomy today and will come back for follow-up visit in 2 weeks for reevaluation with repeat CBC and phlebotomy as needed. The patient was advised to call immediately if he has any concerning symptoms in the interval.  Disclaimer: This note was dictated with voice recognition software. Similar sounding words can inadvertently be transcribed and may be missed upon review. Eilleen Kempf., MD 02/09/2015

## 2015-02-08 NOTE — Patient Instructions (Addendum)
Continue labs and phlebotomy as scheduled Follow-up in 6 weeks for reevaluation Follow-up with your primary care physician regarding your body aches and year Plavix and Crestor

## 2015-02-21 ENCOUNTER — Ambulatory Visit (HOSPITAL_BASED_OUTPATIENT_CLINIC_OR_DEPARTMENT_OTHER): Payer: 59

## 2015-02-21 ENCOUNTER — Other Ambulatory Visit (HOSPITAL_BASED_OUTPATIENT_CLINIC_OR_DEPARTMENT_OTHER): Payer: 59

## 2015-02-21 DIAGNOSIS — D45 Polycythemia vera: Secondary | ICD-10-CM | POA: Diagnosis not present

## 2015-02-21 LAB — CBC WITH DIFFERENTIAL/PLATELET
BASO%: 0.8 % (ref 0.0–2.0)
BASOS ABS: 0.1 10*3/uL (ref 0.0–0.1)
EOS%: 1.4 % (ref 0.0–7.0)
Eosinophils Absolute: 0.1 10*3/uL (ref 0.0–0.5)
HCT: 48.6 % (ref 38.4–49.9)
HEMOGLOBIN: 16.8 g/dL (ref 13.0–17.1)
LYMPH%: 38.6 % (ref 14.0–49.0)
MCH: 29.8 pg (ref 27.2–33.4)
MCHC: 34.6 g/dL (ref 32.0–36.0)
MCV: 86 fL (ref 79.3–98.0)
MONO#: 0.5 10*3/uL (ref 0.1–0.9)
MONO%: 7 % (ref 0.0–14.0)
NEUT#: 3.7 10*3/uL (ref 1.5–6.5)
NEUT%: 52.2 % (ref 39.0–75.0)
Platelets: 226 10*3/uL (ref 140–400)
RBC: 5.64 10*6/uL (ref 4.20–5.82)
RDW: 13.9 % (ref 11.0–14.6)
WBC: 7.2 10*3/uL (ref 4.0–10.3)
lymph#: 2.8 10*3/uL (ref 0.9–3.3)

## 2015-02-21 NOTE — Progress Notes (Signed)
605 gram phlebotomy obtained from L antecubital via 16 gauge phlebotomy set. Site unremarkable, pressure dressing applied. Refreshments given. Pt monitored for :30, tolerated without difficulty. Written discharge instructions given.

## 2015-02-21 NOTE — Patient Instructions (Signed)

## 2015-02-22 ENCOUNTER — Other Ambulatory Visit: Payer: Self-pay | Admitting: Family Medicine

## 2015-03-07 ENCOUNTER — Other Ambulatory Visit (HOSPITAL_BASED_OUTPATIENT_CLINIC_OR_DEPARTMENT_OTHER): Payer: 59

## 2015-03-07 ENCOUNTER — Ambulatory Visit (HOSPITAL_BASED_OUTPATIENT_CLINIC_OR_DEPARTMENT_OTHER): Payer: 59

## 2015-03-07 DIAGNOSIS — D45 Polycythemia vera: Secondary | ICD-10-CM

## 2015-03-07 DIAGNOSIS — D751 Secondary polycythemia: Secondary | ICD-10-CM | POA: Diagnosis not present

## 2015-03-07 LAB — CBC WITH DIFFERENTIAL/PLATELET
BASO%: 1.1 % (ref 0.0–2.0)
Basophils Absolute: 0.1 10*3/uL (ref 0.0–0.1)
EOS%: 1.5 % (ref 0.0–7.0)
Eosinophils Absolute: 0.1 10*3/uL (ref 0.0–0.5)
HEMATOCRIT: 48.5 % (ref 38.4–49.9)
HGB: 17 g/dL (ref 13.0–17.1)
LYMPH%: 36.2 % (ref 14.0–49.0)
MCH: 30.5 pg (ref 27.2–33.4)
MCHC: 35 g/dL (ref 32.0–36.0)
MCV: 87.2 fL (ref 79.3–98.0)
MONO#: 0.6 10*3/uL (ref 0.1–0.9)
MONO%: 7 % (ref 0.0–14.0)
NEUT#: 4.7 10*3/uL (ref 1.5–6.5)
NEUT%: 54.2 % (ref 39.0–75.0)
Platelets: 237 10*3/uL (ref 140–400)
RBC: 5.56 10*6/uL (ref 4.20–5.82)
RDW: 14.1 % (ref 11.0–14.6)
WBC: 8.7 10*3/uL (ref 4.0–10.3)
lymph#: 3.1 10*3/uL (ref 0.9–3.3)

## 2015-03-07 NOTE — Patient Instructions (Signed)

## 2015-03-07 NOTE — Progress Notes (Signed)
Patient received therapeutic phlebotomy today, as per office note dated 02/07/15, with Hct at 48.5.  Patient tolerated well.

## 2015-03-11 ENCOUNTER — Telehealth: Payer: Self-pay | Admitting: *Deleted

## 2015-03-11 NOTE — Telephone Encounter (Signed)
-----   Message from Curt Bears, MD sent at 03/09/2015  2:30 PM EDT ----- Regarding: RE: Supplemental Oxygen PCP to order. ----- Message -----    From: Lucile Crater, RN    Sent: 03/07/2015   4:12 PM      To: Curt Bears, MD Subject: Supplemental Oxygen                            Pt her for phlebotomy, requesting supplemental oxygen at night in addition to the cpap. Pt reports his oxygen sensor goes off after he falls asleep and is concerned he needs more oxygen.   Pt has been on CPAP x 12 years uses [facility].  Should I call [facility] or should he consult PCP for this order.  Pt call back number is [phone]

## 2015-03-11 NOTE — Telephone Encounter (Signed)
Called pt, discussed per MD, PCP will need to order supplemental oxygen at night in addition to CPAP.  Pt verbalized understanding, advised he will speak with Dr. Redmond School ( PCP)  No further concerns.

## 2015-03-21 ENCOUNTER — Ambulatory Visit (HOSPITAL_BASED_OUTPATIENT_CLINIC_OR_DEPARTMENT_OTHER): Payer: 59 | Admitting: Internal Medicine

## 2015-03-21 ENCOUNTER — Telehealth: Payer: Self-pay | Admitting: *Deleted

## 2015-03-21 ENCOUNTER — Telehealth: Payer: Self-pay | Admitting: Internal Medicine

## 2015-03-21 ENCOUNTER — Other Ambulatory Visit (HOSPITAL_BASED_OUTPATIENT_CLINIC_OR_DEPARTMENT_OTHER): Payer: 59

## 2015-03-21 ENCOUNTER — Encounter: Payer: Self-pay | Admitting: Internal Medicine

## 2015-03-21 ENCOUNTER — Ambulatory Visit: Payer: 59

## 2015-03-21 VITALS — BP 130/66 | HR 62 | Temp 98.2°F | Resp 18 | Ht 70.0 in | Wt 278.6 lb

## 2015-03-21 DIAGNOSIS — D751 Secondary polycythemia: Secondary | ICD-10-CM

## 2015-03-21 DIAGNOSIS — D45 Polycythemia vera: Secondary | ICD-10-CM

## 2015-03-21 LAB — CBC WITH DIFFERENTIAL/PLATELET
BASO%: 0.3 % (ref 0.0–2.0)
BASOS ABS: 0 10*3/uL (ref 0.0–0.1)
EOS ABS: 0.2 10*3/uL (ref 0.0–0.5)
EOS%: 2.7 % (ref 0.0–7.0)
HCT: 44.8 % (ref 38.4–49.9)
HGB: 15.9 g/dL (ref 13.0–17.1)
LYMPH%: 36.3 % (ref 14.0–49.0)
MCH: 29.6 pg (ref 27.2–33.4)
MCHC: 35.5 g/dL (ref 32.0–36.0)
MCV: 83.3 fL (ref 79.3–98.0)
MONO#: 0.5 10*3/uL (ref 0.1–0.9)
MONO%: 7 % (ref 0.0–14.0)
NEUT#: 3.6 10*3/uL (ref 1.5–6.5)
NEUT%: 53.7 % (ref 39.0–75.0)
Platelets: 211 10*3/uL (ref 140–400)
RBC: 5.38 10*6/uL (ref 4.20–5.82)
RDW: 13.3 % (ref 11.0–14.6)
WBC: 6.7 10*3/uL (ref 4.0–10.3)
lymph#: 2.4 10*3/uL (ref 0.9–3.3)

## 2015-03-21 NOTE — Telephone Encounter (Signed)
per pof to sch pt appt-gave pt copy of avs-sent MW email to sch phle

## 2015-03-21 NOTE — Progress Notes (Signed)
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 Negative MPL515 mutation, Negative BCR/ABL and negative JAK-2 exon 12.  PRIOR THERAPY: None  CURRENT THERAPY: Phlebotomy on as-needed basis.  INTERVAL HISTORY: [name] 54 y.o. male returns to the clinic today for follow-up visit. He is currently on phlebotomy on as-needed basis and tolerated it well. He is feeling fine today with no specific complaints. He denied having any significant chest pain, shortness of breath, cough or hemoptysis. He continues to use C-PAP for sleep apnea. The patient denied having any significant weight loss or night sweats, no fever or chills. He has repeat CBC performed earlier today and he is here today for evaluation and discussion of his lab results and recommendation regarding his condition.   MEDICAL HISTORY: Past Medical History  Diagnosis Date  . Obesity   . Herpes labialis   . Seborrheic dermatitis   . Psoriasis   . Hypertension   . GERD (gastroesophageal reflux disease)   . Sleep apnea   . Arthritis   . Proteinuria   . Dyslipidemia   . TIA (transient ischemic attack)   . Metabolic syndrome   . Diverticulosis     ALLERGIES:  has No Known Allergies.  MEDICATIONS:  Current Outpatient Prescriptions  Medication Sig Dispense Refill  . Apremilast (OTEZLA) 30 MG TABS Take by mouth.    Marland Kitchen aspirin 81 MG tablet Take 81 mg by mouth daily.    . benazepril (LOTENSIN) 40 MG tablet TAKE 1 TABLET BY MOUTH DAILY 90 tablet 0  . Blood Glucose Monitoring Suppl (TRUERESULT BLOOD GLUCOSE) W/DEVICE KIT 1 each by Does not apply route 2 (two) times daily. 1 each 0  . CARTIA XT 240 MG 24 hr capsule TAKE 1 CAPSULE BY MOUTH DAILY. 90 capsule 0  . clopidogrel (PLAVIX) 75 MG tablet TAKE 1  TABLET BY MOUTH DAILY. 90 tablet 3  . Glucose Blood (BLOOD GLUCOSE TEST STRIPS) STRP PATIENT IS TO TEST BID DX:E11.9 (THIS IS FOR THE TRUE RESULTS) 100 each 12  . HYDROcodone-acetaminophen (NORCO) 5-325 MG per tablet Take 1 tablet by mouth every 6 (six) hours as needed for moderate pain. 30 tablet 0  . LANCETS MICRO THIN 33G MISC PATIENT IS TO TEST BID USE AS DIRECTED DX:E11.9 (THIS IS FOR TRUE RESULT) 100 each 12  . metFORMIN (GLUCOPHAGE-XR) 500 MG 24 hr tablet TAKE 1 TABLET BY MOUTH 2 TIMES DAILY. 60 tablet 2  . pantoprazole (PROTONIX) 40 MG tablet TAKE 1 TABLET BY MOUTH DAILY. 90 tablet 3   No current facility-administered medications for this visit.    SURGICAL HISTORY:  Past Surgical History  Procedure Laterality Date  . Joint replacement  11/03/10    LT HIP  . Colonoscopy    . Tonsillectomy and adenoidectomy    . Wisdom tooth extraction    . Total hip arthroplasty Left     REVIEW OF SYSTEMS:  A comprehensive review of systems was negative.   PHYSICAL EXAMINATION: General appearance: alert, cooperative and no distress Head: Normocephalic, without obvious abnormality, atraumatic Neck: no adenopathy, no JVD, supple, symmetrical, trachea midline and thyroid not enlarged, symmetric, no tenderness/mass/nodules Lymph nodes: Cervical, supraclavicular, and axillary nodes normal. Resp: clear to auscultation bilaterally Back: symmetric, no curvature. ROM normal. No CVA tenderness. Cardio: regular rate and rhythm, S1, S2 normal,  no murmur, click, rub or gallop GI: soft, non-tender; bowel sounds normal; no masses,  no organomegaly Extremities: extremities normal, atraumatic, no cyanosis or edema  ECOG PERFORMANCE STATUS: 0 - Asymptomatic  Blood pressure 130/66, pulse 62, temperature 98.2 F (36.8 C), temperature source Oral, resp. rate 18, height 5' 10" (1.778 m), weight 278 lb 9.6 oz (126.372 kg), SpO2 100 %.  LABORATORY DATA: Lab Results  Component Value Date   WBC 6.7 03/21/2015    HGB 15.9 03/21/2015   HCT 44.8 03/21/2015   MCV 83.3 03/21/2015   PLT 211 03/21/2015      Chemistry      Component Value Date/Time   NA 137 01/24/2015 0826   NA 133* 09/17/2014 0906   K 3.9 01/24/2015 0826   K 4.1 09/17/2014 0906   CL 97 09/17/2014 0906   CO2 21* 01/24/2015 0826   CO2 23 09/17/2014 0906   BUN 16.1 01/24/2015 0826   BUN 13 09/17/2014 0906   CREATININE 1.1 01/24/2015 0826   CREATININE 1.01 09/17/2014 0906   CREATININE 0.87 11/05/2010 0504      Component Value Date/Time   CALCIUM 8.8 01/24/2015 0826   CALCIUM 9.9 09/17/2014 0906   ALKPHOS 58 01/24/2015 0826   ALKPHOS 57 09/17/2014 0906   AST 25 01/24/2015 0826   AST 26 09/17/2014 0906   ALT 33 01/24/2015 0826   ALT 43 09/17/2014 0906   BILITOT 1.33* 01/24/2015 0826   BILITOT 1.4* 09/17/2014 0906       RADIOGRAPHIC STUDIES: No results found.  ASSESSMENT AND PLAN: This is a very pleasant 54 years old white male with polycythemia highly suspicious for polycythemia vera but with negative JAK mutation. His hematocrit today is 44.8%. I discussed the lab result with the patient today. The patient does not need phlebotomy today. He will come back for follow-up visit in 2 months with repeat CBC and phlebotomy if needed. He had several studies performed recently including repeat CBC, comprehensive metabolic panel, LDH as well as JAK 2 exon 12, MPL515 mutation and BCR/ABL and these were reported to be negative. For the dyslipidemia, the patient is currently on treatment with Crestor by his primary care physician. The patient voices understanding of current disease status and treatment options and is in agreement with the current care plan.  All questions were answered. The patient knows to call the clinic with any problems, questions or concerns. We can certainly see the patient much sooner if necessary.   Disclaimer: This note was dictated with voice recognition software. Similar sounding words can inadvertently  be transcribed and may not be corrected upon review.

## 2015-03-21 NOTE — Telephone Encounter (Signed)
Per staff message and POF I have scheduled appts. Advised scheduler of appts. JMW  

## 2015-03-21 NOTE — Progress Notes (Signed)
09:55 - Per Dr. Julien Nordmann, pt does not need phlebotomy with hct 44.8.

## 2015-03-29 ENCOUNTER — Telehealth: Payer: Self-pay | Admitting: Internal Medicine

## 2015-03-29 NOTE — Telephone Encounter (Signed)
Refill request for benazepril 40mg , diltiazem 24hr ER 240mg  #90 to [facility].

## 2015-04-01 ENCOUNTER — Telehealth: Payer: Self-pay | Admitting: Family Medicine

## 2015-04-01 ENCOUNTER — Other Ambulatory Visit: Payer: Self-pay

## 2015-04-01 MED ORDER — DILTIAZEM HCL ER COATED BEADS 240 MG PO CP24: 240.0000 mg | ORAL_CAPSULE | Freq: Every day | ORAL | Status: DC

## 2015-04-01 MED ORDER — BENAZEPRIL HCL 40 MG PO TABS: 40.0000 mg | ORAL_TABLET | Freq: Every day | ORAL | Status: DC

## 2015-04-01 NOTE — Telephone Encounter (Signed)
[facility] pharm req Diltiazem 24 hr ER 240 mg and Benazepril HCL 40 mg tab

## 2015-04-09 ENCOUNTER — Other Ambulatory Visit: Payer: Self-pay

## 2015-04-09 VITALS — BP 136/84 | HR 64 | Resp 16 | Ht 71.0 in | Wt 281.4 lb

## 2015-04-09 DIAGNOSIS — E119 Type 2 diabetes mellitus without complications: Secondary | ICD-10-CM

## 2015-04-09 NOTE — Patient Instructions (Signed)
1. Plan to eat 45-60 Gm(3-4 servings) of carbohydrates a meal and 15Gms for snacks and try to eat protein with snacks 2. Plan to check blood sugar daily fasting or 1 /12-2 hours after eating with goals of 80-130 fasting and less than 180 after meals. 3. Plan to get 10,000 steps a day for 4 days a week.  Goal is greater than 150 minutes a week. 4. Plan to keep appointment with Dr. Redmond School on 05/22/15 5. Plan to return to Link to [facility] September 10, 2015 at [facility]

## 2015-04-09 NOTE — Patient Outreach (Signed)
[name]) Care Management   04/09/2015  [name] [birth year] [id number]  [name] is an 54 y.o. male.  Member seen for follow up office visit for Link to Wellness program for self management of Type 2 diabetes  Subjective: Mmeber states that he has been having phlebomites for his elevated HCT and his levels are better now.  States taht his hemoglobin A1C was down to 5.5 when he saw Dr.Lalonde on 01/16/15.  States he is trying to watch the CHO in his diet but he does have ice cream and cheesecake at times as a treat.  States he is very active at work and he does walk in the weekends with his wife and dogs.  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  Objective:   Today's Vitals   04/09/15 0914 04/09/15 0929  BP: 136/84   Pulse: 64   Resp: 16   Height: 1.803 m ($Remove'5\' 11"'unlTxDA$ )   Weight: 281 lb 6.4 oz (127.642 kg)   SpO2: 98%   PainSc: 2  2    Reviewed glucometer 7 day average-115 14 day average-113 30 day average-116 ROS  Physical Exam  Skin: Bruising and laceration noted.       Current Medications:   Current Outpatient Prescriptions  Medication Sig Dispense Refill  . aspirin 81 MG tablet Take 81 mg by mouth daily.    . benazepril (LOTENSIN) 40 MG tablet Take 1 tablet (40 mg total) by mouth daily. 90 tablet 0  . Blood Glucose Monitoring Suppl (TRUERESULT BLOOD GLUCOSE) W/DEVICE KIT 1 each by Does not apply route 2 (two) times daily. 1 each 0  . clopidogrel (PLAVIX) 75 MG tablet TAKE 1 TABLET BY MOUTH DAILY. (Patient taking differently: TAKE 1 TABLET BY MOUTH every other day) 90 tablet 3  . diltiazem (CARTIA XT) 240 MG 24 hr capsule Take 1 capsule (240 mg total) by mouth daily. 90 capsule 0  . Glucose Blood (BLOOD GLUCOSE TEST STRIPS) STRP PATIENT IS TO TEST BID DX:E11.9 (THIS IS FOR THE TRUE RESULTS) 100 each 12  . HYDROcodone-acetaminophen (NORCO) 5-325 MG per tablet Take 1 tablet by mouth every 6 (six) hours  as needed for moderate pain. 30 tablet 0  . Ixekizumab (TALTZ) 80 MG/ML SOAJ Inject 1 Dose into the skin every 14 (fourteen) days.    Marland Kitchen LANCETS MICRO THIN 33G MISC PATIENT IS TO TEST BID USE AS DIRECTED DX:E11.9 (THIS IS FOR TRUE RESULT) 100 each 12  . metFORMIN (GLUCOPHAGE-XR) 500 MG 24 hr tablet TAKE 1 TABLET BY MOUTH 2 TIMES DAILY. 60 tablet 2  . pantoprazole (PROTONIX) 40 MG tablet TAKE 1 TABLET BY MOUTH DAILY. 90 tablet 3  . Apremilast (OTEZLA) 30 MG TABS Take by mouth.     No current facility-administered medications for this visit.    Functional Status:   In your present state of health, do you have any difficulty performing the following activities: 04/09/2015  Hearing? N  Vision? N  Difficulty concentrating or making decisions? N  Walking or climbing stairs? N  Dressing or bathing? N  Doing errands, shopping? N    Fall/Depression Screening:    PHQ 2/9 Scores 04/09/2015 03/12/2014 09/05/2012  PHQ - 2 Score 0 0 0   [facility] CM Care Plan Problem One        Patient Outreach from 04/09/2015 in [place] Problem One  Potential for elevated blood  sugars related to dx of Type 2 DM   Care Plan for Problem One  Active   [facility] Long Term Goal (31-90 days)  Member will maintain hemoglobin A1C at or below 6.5 until next Link to Wellness visit   [place] Goal Start Date  04/09/15   Interventions for Problem One Long Term Goal  Reviewed CHO counting and portion control, Discussed portion control as a way to help loss weight, Reinforced importance of regular exercise fo glycemic control, Reinforced to keep appointment with Dr. Redmond School on 05/22/15     Assessment:   Member seen for follow up office visit for Link to Wellness program for self management of Type 2 diabetes.  Member's diabetes is well controlled with last hemoglobin A1C of 5.5.  Member continues to work on weight loss and needs to work on portion sizes.  He is active at work and walks on weekends.  Plan:   Plan  to eat 45-60 Gm(3-4 servings) of carbohydrates a meal and 15Gms for snacks and try to eat protein with snacks Plan to check blood sugar daily fasting or 1 /12-2 hours after eating with goals of 80-130 fasting and less than 180 after meals. Plan to get 10,000 steps a day for 4 days a week.  Goal is greater than 150 minutes a week. Plan to keep appointment with Dr. Redmond School on 05/22/15 Plan to return to Link to [facility] September 10, 2015  Peter Garter RN, [facility] Care Management Coordinator-Link to [place] Management [phone]

## 2015-04-29 LAB — HM DIABETES EYE EXAM

## 2015-05-22 ENCOUNTER — Ambulatory Visit (INDEPENDENT_AMBULATORY_CARE_PROVIDER_SITE_OTHER): Payer: 59 | Admitting: Family Medicine

## 2015-05-22 ENCOUNTER — Encounter: Payer: Self-pay | Admitting: Internal Medicine

## 2015-05-22 ENCOUNTER — Ambulatory Visit (HOSPITAL_BASED_OUTPATIENT_CLINIC_OR_DEPARTMENT_OTHER): Payer: 59 | Admitting: Internal Medicine

## 2015-05-22 ENCOUNTER — Other Ambulatory Visit (HOSPITAL_BASED_OUTPATIENT_CLINIC_OR_DEPARTMENT_OTHER): Payer: 59

## 2015-05-22 ENCOUNTER — Encounter: Payer: Self-pay | Admitting: Family Medicine

## 2015-05-22 VITALS — BP 172/90 | HR 70 | Temp 97.7°F | Resp 20 | Ht 71.0 in | Wt 284.4 lb

## 2015-05-22 VITALS — BP 130/70 | HR 70 | Ht 71.0 in | Wt 283.0 lb

## 2015-05-22 DIAGNOSIS — L400 Psoriasis vulgaris: Secondary | ICD-10-CM | POA: Diagnosis not present

## 2015-05-22 DIAGNOSIS — E669 Obesity, unspecified: Secondary | ICD-10-CM

## 2015-05-22 DIAGNOSIS — E1169 Type 2 diabetes mellitus with other specified complication: Secondary | ICD-10-CM

## 2015-05-22 DIAGNOSIS — D45 Polycythemia vera: Secondary | ICD-10-CM

## 2015-05-22 DIAGNOSIS — I10 Essential (primary) hypertension: Secondary | ICD-10-CM

## 2015-05-22 DIAGNOSIS — B351 Tinea unguium: Secondary | ICD-10-CM | POA: Diagnosis not present

## 2015-05-22 DIAGNOSIS — E119 Type 2 diabetes mellitus without complications: Secondary | ICD-10-CM

## 2015-05-22 DIAGNOSIS — E1159 Type 2 diabetes mellitus with other circulatory complications: Secondary | ICD-10-CM

## 2015-05-22 DIAGNOSIS — E785 Hyperlipidemia, unspecified: Secondary | ICD-10-CM | POA: Diagnosis not present

## 2015-05-22 DIAGNOSIS — Z1159 Encounter for screening for other viral diseases: Secondary | ICD-10-CM

## 2015-05-22 DIAGNOSIS — G4733 Obstructive sleep apnea (adult) (pediatric): Secondary | ICD-10-CM | POA: Diagnosis not present

## 2015-05-22 DIAGNOSIS — Z23 Encounter for immunization: Secondary | ICD-10-CM

## 2015-05-22 DIAGNOSIS — I152 Hypertension secondary to endocrine disorders: Secondary | ICD-10-CM

## 2015-05-22 LAB — COMPREHENSIVE METABOLIC PANEL (CC13)
ALBUMIN: 3.9 g/dL (ref 3.5–5.0)
ALK PHOS: 73 U/L (ref 40–150)
ALT: 30 U/L (ref 0–55)
AST: 24 U/L (ref 5–34)
Anion Gap: 9 mEq/L (ref 3–11)
BILIRUBIN TOTAL: 0.77 mg/dL (ref 0.20–1.20)
BUN: 17.4 mg/dL (ref 7.0–26.0)
CO2: 23 mEq/L (ref 22–29)
CREATININE: 1.1 mg/dL (ref 0.7–1.3)
Calcium: 9.4 mg/dL (ref 8.4–10.4)
Chloride: 106 mEq/L (ref 98–109)
EGFR: 78 mL/min/{1.73_m2} — ABNORMAL LOW (ref >90)
GLUCOSE: 162 mg/dL — AB (ref 70–140)
POTASSIUM: 4.1 meq/L (ref 3.5–5.1)
SODIUM: 138 meq/L (ref 136–145)
TOTAL PROTEIN: 6.9 g/dL (ref 6.4–8.3)

## 2015-05-22 LAB — CBC WITH DIFFERENTIAL/PLATELET
BASO%: 0.4 % (ref 0.0–2.0)
Basophils Absolute: 0 10*3/uL (ref 0.0–0.1)
EOS%: 2.1 % (ref 0.0–7.0)
Eosinophils Absolute: 0.2 10*3/uL (ref 0.0–0.5)
HEMATOCRIT: 46.1 % (ref 38.4–49.9)
HEMOGLOBIN: 16.4 g/dL (ref 13.0–17.1)
LYMPH#: 3 10*3/uL (ref 0.9–3.3)
LYMPH%: 39.3 % (ref 14.0–49.0)
MCH: 28.2 pg (ref 27.2–33.4)
MCHC: 35.6 g/dL (ref 32.0–36.0)
MCV: 79.2 fL — ABNORMAL LOW (ref 79.3–98.0)
MONO#: 0.7 10*3/uL (ref 0.1–0.9)
MONO%: 8.7 % (ref 0.0–14.0)
NEUT%: 49.5 % (ref 39.0–75.0)
NEUTROS ABS: 3.8 10*3/uL (ref 1.5–6.5)
Platelets: 187 10*3/uL (ref 140–400)
RBC: 5.82 10*6/uL (ref 4.20–5.82)
RDW: 14.4 % (ref 11.0–14.6)
WBC: 7.7 10*3/uL (ref 4.0–10.3)

## 2015-05-22 LAB — LACTATE DEHYDROGENASE (CC13): LDH: 147 U/L (ref 125–245)

## 2015-05-22 LAB — POCT GLYCOSYLATED HEMOGLOBIN (HGB A1C): HEMOGLOBIN A1C: 6

## 2015-05-22 NOTE — Patient Instructions (Signed)
Start checking your blood sugars 2 hours after several be meals to get immediate feedback

## 2015-05-22 NOTE — Progress Notes (Signed)
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 Negative MPL515 mutation, Negative BCR/ABL and negative JAK-2 exon 12.  PRIOR THERAPY: None  CURRENT THERAPY: Phlebotomy on as-needed basis.  INTERVAL HISTORY: [name] 54 y.o. male returns to the clinic today for follow-up visit. He is currently on phlebotomy on as-needed basis and tolerated it well. Has been observation for the last 2 months. He is feeling fine today with no specific complaints. He denied having any significant chest pain, shortness of breath, cough or hemoptysis. The patient denied having any significant weight loss or night sweats, no fever or chills. He is currently on treatment with Taltz for psoriasis. He has repeat CBC performed earlier today and he is here today for evaluation and discussion of his lab results and recommendation regarding his condition.   MEDICAL HISTORY: Past Medical History  Diagnosis Date  . Obesity   . Herpes labialis   . Seborrheic dermatitis   . Psoriasis   . Hypertension   . GERD (gastroesophageal reflux disease)   . Sleep apnea   . Arthritis   . Proteinuria   . Dyslipidemia   . TIA (transient ischemic attack)   . Metabolic syndrome   . Diverticulosis     ALLERGIES:  has No Known Allergies.  MEDICATIONS:  Current Outpatient Prescriptions  Medication Sig Dispense Refill  . aspirin 81 MG tablet Take 81 mg by mouth daily.    . benazepril (LOTENSIN) 40 MG tablet Take 1 tablet (40 mg total) by mouth daily. 90 tablet 0  . Blood Glucose Monitoring Suppl (TRUERESULT BLOOD GLUCOSE) W/DEVICE KIT 1 each by Does not apply route 2 (two) times daily. 1 each 0  . clopidogrel (PLAVIX) 75 MG tablet TAKE 1 TABLET BY MOUTH DAILY. (Patient taking differently: TAKE 1  TABLET BY MOUTH every other day) 90 tablet 3  . diltiazem (CARTIA XT) 240 MG 24 hr capsule Take 1 capsule (240 mg total) by mouth daily. 90 capsule 0  . Glucose Blood (BLOOD GLUCOSE TEST STRIPS) STRP PATIENT IS TO TEST BID DX:E11.9 (THIS IS FOR THE TRUE RESULTS) 100 each 12  . Ixekizumab (TALTZ) 80 MG/ML SOAJ Inject 1 Dose into the skin every 14 (fourteen) days.    Marland Kitchen LANCETS MICRO THIN 33G MISC PATIENT IS TO TEST BID USE AS DIRECTED DX:E11.9 (THIS IS FOR TRUE RESULT) 100 each 12  . metFORMIN (GLUCOPHAGE-XR) 500 MG 24 hr tablet TAKE 1 TABLET BY MOUTH 2 TIMES DAILY. 60 tablet 2  . pantoprazole (PROTONIX) 40 MG tablet TAKE 1 TABLET BY MOUTH DAILY. 90 tablet 3  . HYDROcodone-acetaminophen (NORCO) 5-325 MG per tablet Take 1 tablet by mouth every 6 (six) hours as needed for moderate pain. (Patient not taking: Reported on 05/22/2015) 30 tablet 0   No current facility-administered medications for this visit.    SURGICAL HISTORY:  Past Surgical History  Procedure Laterality Date  . Joint replacement  11/03/10    LT HIP  . Colonoscopy    . Tonsillectomy and adenoidectomy    . Wisdom tooth extraction    . Total hip arthroplasty Left     REVIEW OF SYSTEMS:  A comprehensive review of systems was negative.   PHYSICAL EXAMINATION: General appearance: alert, cooperative and no distress Head: Normocephalic, without obvious abnormality, atraumatic Neck: no adenopathy, no JVD, supple,  symmetrical, trachea midline and thyroid not enlarged, symmetric, no tenderness/mass/nodules Lymph nodes: Cervical, supraclavicular, and axillary nodes normal. Resp: clear to auscultation bilaterally Back: symmetric, no curvature. ROM normal. No CVA tenderness. Cardio: regular rate and rhythm, S1, S2 normal, no murmur, click, rub or gallop GI: soft, non-tender; bowel sounds normal; no masses,  no organomegaly Extremities: extremities normal, atraumatic, no cyanosis or edema  ECOG PERFORMANCE STATUS: 0 -  Asymptomatic  Blood pressure 172/90, pulse 70, temperature 97.7 F (36.5 C), temperature source Oral, resp. rate 20, height 5' 11"  (1.803 m), weight 284 lb 6.4 oz (129.003 kg), SpO2 92 %.  LABORATORY DATA: Lab Results  Component Value Date   WBC 7.7 05/22/2015   HGB 16.4 05/22/2015   HCT 46.1 05/22/2015   MCV 79.2* 05/22/2015   PLT 187 05/22/2015      Chemistry      Component Value Date/Time   NA 137 01/24/2015 0826   NA 133* 09/17/2014 0906   K 3.9 01/24/2015 0826   K 4.1 09/17/2014 0906   CL 97 09/17/2014 0906   CO2 21* 01/24/2015 0826   CO2 23 09/17/2014 0906   BUN 16.1 01/24/2015 0826   BUN 13 09/17/2014 0906   CREATININE 1.1 01/24/2015 0826   CREATININE 1.01 09/17/2014 0906   CREATININE 0.87 11/05/2010 0504      Component Value Date/Time   CALCIUM 8.8 01/24/2015 0826   CALCIUM 9.9 09/17/2014 0906   ALKPHOS 58 01/24/2015 0826   ALKPHOS 57 09/17/2014 0906   AST 25 01/24/2015 0826   AST 26 09/17/2014 0906   ALT 33 01/24/2015 0826   ALT 43 09/17/2014 0906   BILITOT 1.33* 01/24/2015 0826   BILITOT 1.4* 09/17/2014 0906       RADIOGRAPHIC STUDIES: No results found.  ASSESSMENT AND PLAN: This is a very pleasant 54 years old white male with polycythemia highly suspicious for polycythemia vera but with negative JAK mutation. His hematocrit today is 46.1%. I discussed the lab result with the patient today. The patient does not need phlebotomy today. He will come back for follow-up visit in 2 months with repeat CBC and phlebotomy if needed. For the dyslipidemia, the patient is currently on treatment with Crestor by his primary care physician. For psoriasis, he will continue his treatment with Taltz. The patient voices understanding of current disease status and treatment options and is in agreement with the current care plan.  All questions were answered. The patient knows to call the clinic with any problems, questions or concerns. We can certainly see the patient  much sooner if necessary.   Disclaimer: This note was dictated with voice recognition software. Similar sounding words can inadvertently be transcribed and may not be corrected upon review.

## 2015-05-22 NOTE — Progress Notes (Signed)
  Subjective:    Patient ID: [name], male    DOB: 1961/05/04, 54 y.o.   MRN: [id number]  [name] is a 54 y.o. male who presents for follow-up of Type 2 diabetes mellitus.  Home blood sugar records: PATIENT TEST ONE TIME A DAY Current symptoms/problems NONE.He has back off a little bit on his eating and noted a slight weight gain. Daily foot checks:YES   Any foot concerns: NONE Exercise: 10 TO 12 THOUSAND STEPS A DAY EYES: [id number] He is now on a new injectable medication to help treat his psoriasis and states that it is helping.He continues on his CPAP and is having no difficulty with that The following portions of the patient's history were reviewed and updated as appropriate: allergies, current medications, past medical history, past social history and problem list.  ROS as in subjective above.     Objective:    Physical Exam Alert and in no distress otherwise not examined.    Lab Review Diabetic Labs Latest Ref Rng 01/24/2015 01/16/2015 10/09/2014 09/17/2014 05/14/2014  HbA1c - - 5.5% - 5.3 5.9  Chol 0 - 200 mg/dL - - - 177 -  HDL >39 mg/dL - - - 43 -  Calc LDL 0 - 99 mg/dL - - - 92 -  Triglycerides <150 mg/dL - - - 211(H) -  Creatinine 0.7 - 1.3 mg/dL 1.1 - 1.1 1.01 -   BP/Weight 04/09/2015 03/21/2015 03/07/2015 11/05/6576 12/05/9627  Systolic BP 528 413 244 010 272  Diastolic BP 84 66 35 94 93  Wt. (Lbs) 281.4 278.6 - - -  BMI 39.26 39.97 - - -   Foot/eye exam completion dates Latest Ref Rng 04/29/2015 05/14/2014  Eye Exam No Retinopathy No Retinopathy -  Foot Form Completion - - Done    [name]  reports that he has never smoked. He has never used smokeless tobacco. He reports that he drinks alcohol. He reports that he does not use illicit drugs.  A1c is 6.0.Foot exam today was negative except for onychomycosis on the left    Assessment & Plan:    Need for prophylactic vaccination against Streptococcus pneumoniae (pneumococcus) - Plan: Pneumococcal polysaccharide  vaccine 23-valent greater than or equal to 2yo subcutaneous/IM  Type 2 diabetes mellitus without complication - Plan: POCT glycosylated hemoglobin (Hb A1C)  Plaque psoriasis  Obstructive sleep apnea  Obesity (BMI 30-39.9)  Hypertension associated with diabetes  Hyperlipidemia associated with type 2 diabetes mellitus  Need for prophylactic vaccination and inoculation against influenza - Plan: Flu Vaccine QUAD 36+ mos PF IM (Fluarix & Fluzone Quad PF)  Need for hepatitis C screening test - Plan: Hepatitis C Antibody  Onychomycosis   1. Rx changes: none 2. Education: Reviewed 'ABCs' of diabetes management (respective goals in parentheses):  A1C (<7), blood pressure (<130/80), and cholesterol (LDL <100). 3. Compliance at present is estimated to be good. Efforts to improve compliance (if necessary) will be directed at regular blood sugar monitoring: 1 times daily. 4. Follow up: 4 months Stressed checking his blood sugars on occasion 2 hours after meals to giving better feedback on his diet. Also encouraged him to remain physically active. Suggested he get back on his regular diet routine. I discussed the onychomycosis and at this time we will hold off on treatment. He is comfortable with this.

## 2015-05-23 LAB — HEPATITIS C ANTIBODY: HCV AB: NEGATIVE

## 2015-06-05 ENCOUNTER — Other Ambulatory Visit: Payer: Self-pay | Admitting: Family Medicine

## 2015-06-24 ENCOUNTER — Other Ambulatory Visit: Payer: Self-pay | Admitting: Family Medicine

## 2015-07-31 ENCOUNTER — Ambulatory Visit (INDEPENDENT_AMBULATORY_CARE_PROVIDER_SITE_OTHER): Payer: 59 | Admitting: Family Medicine

## 2015-07-31 ENCOUNTER — Encounter: Payer: Self-pay | Admitting: Family Medicine

## 2015-07-31 VITALS — BP 130/90 | HR 64 | Wt 287.2 lb

## 2015-07-31 DIAGNOSIS — M25561 Pain in right knee: Secondary | ICD-10-CM

## 2015-07-31 MED ORDER — IBUPROFEN 800 MG PO TABS: 800.0000 mg | ORAL_TABLET | Freq: Three times a day (TID) | ORAL | Status: DC | PRN

## 2015-07-31 NOTE — Progress Notes (Signed)
   Subjective:    Patient ID: [name], male    DOB: 1961-01-11, 54 y.o.   MRN: [id number]  HPI  He is here with complaints of right knee pain, swelling, popping and feels like it could give away since yesterday. States pain started while walking, denies acute injury.  History of right knee soreness in distant past. Denies history of surgery or injury. He has taken ibuprofen 400 mg 3 times since pain started. And used ice with some relief.  Reviewed allergies, medications, past medical and social history.  Review of Systems Pertinent positives and negatives in the history of present illness.    Objective:   Physical Exam BP 130/90 mmHg  Pulse 64  Wt 287 lb 3.2 oz (130.273 kg)  Ext: right knee- no erythema, warmth, obvious deformity, moderate edema, no obvious laxity, limited ROM due to pain. Difficult to perform good exam to right knee due to patient's discomfort. Normal color, sensation, pulse to RLE.       Assessment & Plan:  Knee pain, acute, right  Suspect possible cartilage injury but difficult to fully assess due to pain. Recommend anti-inflammatories, ice, and knee sleeve and let knee quiet down. He will follow up in 7-10 days or sooner if worsens.  Dr. Redmond School examined patient in conjunction with this provider.

## 2015-07-31 NOTE — Patient Instructions (Signed)
Take the ibuprofen 800 mg 3 times daily with food for the next week and use ice as needed. You can try using a knee sleeve for comfort. Follow-up with [place] and approximate 10 days or sooner if needed.

## 2015-08-09 ENCOUNTER — Encounter: Payer: Self-pay | Admitting: Family Medicine

## 2015-08-09 ENCOUNTER — Ambulatory Visit (INDEPENDENT_AMBULATORY_CARE_PROVIDER_SITE_OTHER): Payer: 59 | Admitting: Family Medicine

## 2015-08-09 VITALS — BP 136/80 | HR 60 | Wt 289.0 lb

## 2015-08-09 DIAGNOSIS — M25561 Pain in right knee: Secondary | ICD-10-CM

## 2015-08-09 DIAGNOSIS — M25571 Pain in right ankle and joints of right foot: Secondary | ICD-10-CM

## 2015-08-09 MED ORDER — HYDROCODONE-ACETAMINOPHEN 5-325 MG PO TABS: 1.0000 | ORAL_TABLET | Freq: Four times a day (QID) | ORAL | Status: DC | PRN

## 2015-08-09 MED ORDER — IBUPROFEN 800 MG PO TABS: 800.0000 mg | ORAL_TABLET | Freq: Three times a day (TID) | ORAL | Status: DC | PRN

## 2015-08-09 NOTE — Patient Instructions (Signed)
I think you might benefit from an injection in your right knee. Please see Dr. Redmond School about this. You can continue taking Ibuprofen with food until you follow up.   Arthritis Arthritis is a term that is commonly used to refer to joint pain or joint disease. There are more than 100 types of arthritis. CAUSES The most common cause of this condition is wear and tear of a joint. Other causes include:  Gout.  Inflammation of a joint.  An infection of a joint.  Sprains and other injuries near the joint.  A drug reaction or allergic reaction. In some cases, the cause may not be known. SYMPTOMS The main symptom of this condition is pain in the joint with movement. Other symptoms include:  Redness, swelling, or stiffness at a joint.  Warmth coming from the joint.  Fever.  Overall feeling of illness. DIAGNOSIS This condition may be diagnosed with a physical exam and tests, including:  Blood tests.  Urine tests.  Imaging tests, such as MRI, X-rays, or a CT scan. Sometimes, fluid is removed from a joint for testing. TREATMENT Treatment for this condition may involve:  Treatment of the cause, if it is known.  Rest.  Raising (elevating) the joint.  Applying cold or hot packs to the joint.  Medicines to improve symptoms and reduce inflammation.  Injections of a steroid such as cortisone into the joint to help reduce pain and inflammation. Depending on the cause of your arthritis, you may need to make lifestyle changes to reduce stress on your joint. These changes may include exercising more and losing weight. HOME CARE INSTRUCTIONS Medicines  Take over-the-counter and prescription medicines only as told by your health care provider.  Do not take aspirin to relieve pain if gout is suspected. Activities  Rest your joint if told by your health care provider. Rest is important when your disease is active and your joint feels painful, swollen, or stiff.  Avoid activities  that make the pain worse. It is important to balance activity with rest.  Exercise your joint regularly with range-of-motion exercises as told by your health care provider. Try doing low-impact exercise, such as:  Swimming.  Water aerobics.  Biking.  Walking. Joint Care  If your joint is swollen, keep it elevated if told by your health care provider.  If your joint feels stiff in the morning, try taking a warm shower.  If directed, apply heat to the joint. If you have diabetes, do not apply heat without permission from your health care provider.  Put a towel between the joint and the hot pack or heating pad.  Leave the heat on the area for 20-30 minutes.  If directed, apply ice to the joint:  Put ice in a plastic bag.  Place a towel between your skin and the bag.  Leave the ice on for 20 minutes, 2-3 times per day.  Keep all follow-up visits as told by your health care provider. This is important. SEEK MEDICAL CARE IF:  The pain gets worse.  You have a fever. SEEK IMMEDIATE MEDICAL CARE IF:  You develop severe joint pain, swelling, or redness.  Many joints become painful and swollen.  You develop severe back pain.  You develop severe weakness in your leg.  You cannot control your bladder or bowels.   This information is not intended to replace advice given to you by your health care provider. Make sure you discuss any questions you have with your health care provider.  Document Released: 09/24/2004 Document Revised: 05/08/2015 Document Reviewed: 11/12/2014 Elsevier Interactive Patient Education [employer].

## 2015-08-09 NOTE — Progress Notes (Signed)
   Subjective:    Patient ID: [name], male    DOB: [birth year], 54 y.o.   MRN: [id number]  HPI  Chief Complaint  Patient presents with  . follow-up    follow-up on knee pain. gotten better some but still having trouble walking and having to be careful. ibuprofen has made whole body sore   He is here for follow up of right knee pain. He states knee pain is about 75% better and swelling is almost resolved but continues to have some pain especially when going down stairs. He has been taking Ibuprofen 800 mg three times daily since his last visit and states this seems to be helping. Denies popping, locking or giving away but he states he is careful because it does feel somewhat unstable. He reports all of his joints are a little achy and questions whether his statin is causing this. Requests refill on hydrocodone for joint pain. He states he only takes this every now and then and has approximately 3 left.    Reviewed allergies, medications, past medical and social history.  Review of Systems Pertinent positives and negatives in the history of present illness.    Objective:   Physical Exam BP 136/80 mmHg  Pulse 60  Wt 289 lb (131.09 kg) Alert and in no distress. Right knee with minimal edema and without erythema, warmth. Non tender, no laxity.      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 Discussed that his symptoms are much better but he would probably still benefit from a knee injection and that he should check with Dr. Redmond School on this. Refilled Ibuprofen. Patient also requesting that I refill hydrocodone. He states he only uses this occasionally and has not had it refilled since 2014. Discussed that I would give him a few Norco and that he could bring this up with Dr. Redmond School at his next appointment. Also discussed that weight loss would also help his knee pain.    Recommend that he follow-up in January at his complete physical as scheduled and address possible statin side effects at that time. No changes to his medications today.

## 2015-09-03 MED FILL — TALTZ 80 MG/ML AUTOINJECTOR: 80 | 28 days supply | Qty: 1 | Fill #2

## 2015-09-06 ENCOUNTER — Other Ambulatory Visit: Payer: Self-pay | Admitting: Family Medicine

## 2015-09-06 NOTE — Telephone Encounter (Signed)
Called pt to find out if he need this refilled or was it just an auto refill. Pt was prescribed this for his knee

## 2015-09-09 MED FILL — METFORMIN HCL ER 500 MG TAB: 500 | 30 days supply | Qty: 60 | Fill #1

## 2015-09-09 MED FILL — TRUE METRIX GLUCOSE TEST ST: 90 days supply | Qty: 200 | Fill #2

## 2015-09-09 MED FILL — TRUEplus LANCETS 30G MISC: 90 days supply | Qty: 200 | Fill #2

## 2015-09-09 NOTE — Telephone Encounter (Signed)
Will deny this until patient calls back and will see if this is okay to refill

## 2015-09-10 ENCOUNTER — Other Ambulatory Visit: Payer: Self-pay

## 2015-09-10 VITALS — BP 138/84 | HR 74 | Resp 16 | Ht 71.0 in | Wt 285.2 lb

## 2015-09-10 DIAGNOSIS — E119 Type 2 diabetes mellitus without complications: Secondary | ICD-10-CM

## 2015-09-10 NOTE — Patient Outreach (Signed)
[facility]) Care Management   09/10/2015  [name] Nov 27, 1960 [id number]  [name] is an 55 y.o. male.   Member seen for follow up office visit for Link to Wellness program for self management of Type 2 diabetes  Subjective:  Member states that he has not lost any weight since his last visit.  States that he still eats out for lunch most days but he does try to limit his CHO.  States he is still very active at work and he walks the dogs most days.    States he has finished having regular phlebotomies and he will have as needed according to his blood count.  States he is to see Dr. Redmond School on 09/12/15 for his physical. States he has signed up to be in the [employer]  Objective:   Review of Systems  All other systems reviewed and are negative.   Physical Exam  Today's Vitals   09/10/15 0903  BP: 138/84  Pulse: 74  Resp: 16  Height: 1.803 m (5' 11")  Weight: 285 lb 3.2 oz (129.366 kg)  SpO2: 98%  PainSc: 0-No pain    Current Medications:   Current Outpatient Prescriptions  Medication Sig Dispense Refill  . aspirin 81 MG tablet Take 81 mg by mouth daily.    . benazepril (LOTENSIN) 40 MG tablet TAKE 1 TABLET BY MOUTH DAILY. 90 tablet 3  . Blood Glucose Monitoring Suppl (TRUERESULT BLOOD GLUCOSE) W/DEVICE KIT 1 each by Does not apply route 2 (two) times daily. 1 each 0  . clopidogrel (PLAVIX) 75 MG tablet TAKE 1 TABLET BY MOUTH DAILY. (Patient taking differently: TAKE 1 TABLET BY MOUTH every other day) 90 tablet 3  . diltiazem (CARDIZEM CD) 240 MG 24 hr capsule TAKE 1 CAPSULE BY MOUTH DAILY. 90 capsule 3  . Glucose Blood (BLOOD GLUCOSE TEST STRIPS) STRP PATIENT IS TO TEST BID DX:E11.9 (THIS IS FOR THE TRUE RESULTS) 100 each 12  . HYDROcodone-acetaminophen (NORCO) 5-325 MG tablet Take 1 tablet by mouth every 6 (six) hours as needed for moderate pain. 10 tablet 0  . ibuprofen (ADVIL,MOTRIN) 800 MG tablet Take 1 tablet (800 mg total) by mouth every 8  (eight) hours as needed. 30 tablet 0  . Ixekizumab (TALTZ) 80 MG/ML SOAJ Inject 1 Dose into the skin every 30 (thirty) days.     Marland Kitchen LANCETS MICRO THIN 33G MISC PATIENT IS TO TEST BID USE AS DIRECTED DX:E11.9 (THIS IS FOR TRUE RESULT) 100 each 12  . metFORMIN (GLUCOPHAGE-XR) 500 MG 24 hr tablet TAKE 1 TABLET BY MOUTH 2 TIMES DAILY. 60 tablet 3  . pantoprazole (PROTONIX) 40 MG tablet TAKE 1 TABLET BY MOUTH DAILY. (Patient taking differently: TAKE 1 TABLET every other day) 90 tablet 3  . Vitamin D, Cholecalciferol, 1000 units CAPS Take 1 capsule by mouth daily.    . rosuvastatin (CRESTOR) 10 MG tablet TAKE 1 TABLET BY MOUTH DAILY. (Patient taking differently: Takes every other day) 90 tablet 3   No current facility-administered medications for this visit.    Functional Status:   In your present state of health, do you have any difficulty performing the following activities: 09/10/2015 04/09/2015  Hearing? N N  Vision? N N  Difficulty concentrating or making decisions? N N  Walking or climbing stairs? N N  Dressing or bathing? N N  Doing errands, shopping? N N    Fall/Depression Screening:    PHQ 2/9 Scores 09/10/2015 04/09/2015 03/12/2014 09/05/2012  PHQ -  2 Score 0 0 0 0    Assessment:   Member seen for follow up office visit for Link to Wellness program for self management of Type 2 diabetes.  Member is meeting diabetes self management goal of hemoglobin A1C of less than 7 with last result 6.  Member continues to struggle with losing weight and is planning on enrolling in the [facility] program. Reports blood sugars fasting range 92-137.  Member reports he is active at work and walks his dogs.  Reports step averages 6000-12000 a day.  Plan:  1. Plan to eat 45-60 Gm(3-4 servings) of carbohydrates a meal and 15Gms for snacks and try to eat protein with snacks 2. Plan to check blood sugar daily fasting or 1 /12-2 hours after eating with goals of 80-130 fasting and less than 180 after meals. 3. Plan  to get 10,000 steps a day for 4 days a week.  Goal is greater than 150 minutes a week. 4. Plan to join the [facility] program 5. Plan to keep appointment with Dr. Redmond School on 09/12/15 6. Plan to return to Link to Wellness 01/14/16 at [facility]        Most Recent Value   Care Plan Problem One  Potential for elevated blood sugars related to dx of Type 2 DM   Role Documenting the Problem One  Care Management Coordinator   Care Plan for Problem One  Active   [facility] Long Term Goal (31-90 days)  Member will maintain hemoglobin A1C at or below 6.5 until next Link to Wellness visit   [place] Term Goal Start Date  09/10/15 Maryjane Hurter hemoglobin A1C 6 maintaining goal]   Interventions for Problem One Long Term Goal  Reviewed CHO counting and portion control, Reinforced to limit portions when eating out, Encouraged to participate in the [facility] program,  Reinforced importance of regular exercise fo glycemic control, Reinforced to keep appointment with Dr. Redmond School on 09/12/15    Peter Garter RN, BSN,CCM Care Management Coordinator-Link to [place] Management [phone]

## 2015-09-10 NOTE — Patient Instructions (Signed)
1. Plan to eat 45-60 Gm(3-4 servings) of carbohydrates a meal and 15Gms for snacks and try to eat protein with snacks 2. Plan to check blood sugar daily fasting or 1 /12-2 hours after eating with goals of 80-130 fasting and less than 180 after meals. 3. Plan to get 10,000 steps a day for 4 days a week.  Goal is greater than 150 minutes a week. 4. Plan to join the [facility] program 5. Plan to keep appointment with Dr. Redmond School on 09/12/15 6. Plan to return to Link to Wellness 01/14/16 at [facility]

## 2015-09-12 ENCOUNTER — Encounter: Payer: Self-pay | Admitting: Family Medicine

## 2015-09-12 ENCOUNTER — Ambulatory Visit (INDEPENDENT_AMBULATORY_CARE_PROVIDER_SITE_OTHER): Payer: 59 | Admitting: Family Medicine

## 2015-09-12 VITALS — BP 140/92 | HR 72 | Ht 69.0 in | Wt 283.4 lb

## 2015-09-12 DIAGNOSIS — D45 Polycythemia vera: Secondary | ICD-10-CM | POA: Diagnosis not present

## 2015-09-12 DIAGNOSIS — E1169 Type 2 diabetes mellitus with other specified complication: Secondary | ICD-10-CM | POA: Diagnosis not present

## 2015-09-12 DIAGNOSIS — B351 Tinea unguium: Secondary | ICD-10-CM

## 2015-09-12 DIAGNOSIS — E785 Hyperlipidemia, unspecified: Secondary | ICD-10-CM | POA: Diagnosis not present

## 2015-09-12 DIAGNOSIS — I10 Essential (primary) hypertension: Secondary | ICD-10-CM

## 2015-09-12 DIAGNOSIS — E1159 Type 2 diabetes mellitus with other circulatory complications: Secondary | ICD-10-CM

## 2015-09-12 DIAGNOSIS — G4733 Obstructive sleep apnea (adult) (pediatric): Secondary | ICD-10-CM | POA: Diagnosis not present

## 2015-09-12 DIAGNOSIS — E669 Obesity, unspecified: Secondary | ICD-10-CM

## 2015-09-12 DIAGNOSIS — E119 Type 2 diabetes mellitus without complications: Secondary | ICD-10-CM

## 2015-09-12 DIAGNOSIS — L400 Psoriasis vulgaris: Secondary | ICD-10-CM

## 2015-09-12 DIAGNOSIS — I152 Hypertension secondary to endocrine disorders: Secondary | ICD-10-CM

## 2015-09-12 LAB — LIPID PANEL
Cholesterol: 127 mg/dL (ref 125–200)
HDL: 41 mg/dL (ref >=40)
LDL CALC: 45 mg/dL (ref <130)
TRIGLYCERIDES: 207 mg/dL — AB (ref <150)
Total CHOL/HDL Ratio: 3.1 Ratio (ref <=5.0)
VLDL: 41 mg/dL — AB (ref <30)

## 2015-09-12 LAB — CBC WITH DIFFERENTIAL/PLATELET
BASOS PCT: 0 % (ref 0–1)
Basophils Absolute: 0 10*3/uL (ref 0.0–0.1)
Eosinophils Absolute: 0.2 10*3/uL (ref 0.0–0.7)
Eosinophils Relative: 3 % (ref 0–5)
HEMATOCRIT: 48.9 % (ref 39.0–52.0)
Hemoglobin: 17.1 g/dL — ABNORMAL HIGH (ref 13.0–17.0)
LYMPHS PCT: 41 % (ref 12–46)
Lymphs Abs: 2.7 10*3/uL (ref 0.7–4.0)
MCH: 29.1 pg (ref 26.0–34.0)
MCHC: 35 g/dL (ref 30.0–36.0)
MCV: 83.2 fL (ref 78.0–100.0)
MONO ABS: 0.5 10*3/uL (ref 0.1–1.0)
MPV: 9.7 fL (ref 8.6–12.4)
Monocytes Relative: 7 % (ref 3–12)
Neutro Abs: 3.3 10*3/uL (ref 1.7–7.7)
Neutrophils Relative %: 49 % (ref 43–77)
Platelets: 211 10*3/uL (ref 150–400)
RBC: 5.88 MIL/uL — AB (ref 4.22–5.81)
RDW: 16.3 % — AB (ref 11.5–15.5)
WBC: 6.7 10*3/uL (ref 4.0–10.5)

## 2015-09-12 LAB — POCT GLYCOSYLATED HEMOGLOBIN (HGB A1C): Hemoglobin A1C: 6.1

## 2015-09-12 MED ORDER — TERBINAFINE HCL 250 MG PO TABS: 250.0000 mg | ORAL_TABLET | Freq: Every day | ORAL | Status: DC

## 2015-09-12 MED FILL — TERBINAFINE HCL 250 MG TAB: 250 | 90 days supply | Qty: 90 | Fill #0

## 2015-09-12 NOTE — Progress Notes (Signed)
   Subjective:    Patient ID: [name], male    DOB: 1961/03/06, 55 y.o.   MRN: [id number]  HPI He is here for a follow-up visit. He does have diabetes and is involved in a [facility] program and will soon start him and knew program [facility]. Both of these are to help with his diabetes. The last note From patient outreach was 2 days ago and was reviewed.His last eye exam was August. He does check his feet regularly and does have toenail fungus on the left but not on the right and would like this further treated. He keeps himself quite active at work and does eat out a lot especially at lunch. He does not smoke and drinking was reviewed. He does have reflux disease and uses Protonix every other day for this. He is also taking Crestor every other day. He continues on his DMARD for psoriasis and this is working quite well. He is very happy with his response. He has not seen his hematologist in several months. Family and social history as well as health maintenance and immunizations were reviewed. He has no other concerns or complaints specifically chest pain, shortness of breath, weakness, abdominal pain.   Review of Systems  All other systems reviewed and are negative.      Objective:   Physical Exam Alert and in no distress. Exam of the skin is entirely normal. Exam of his left foot does show thickening of the nail bed. Hemoglobin A1c is 6.1.      Assessment & Plan:  Onychomycosis - Plan: terbinafine (LAMISIL) 250 MG tablet  Type 2 diabetes mellitus without complication, without long-term current use of insulin (HCC) - Plan: HgB A1c  Obesity (BMI 30-39.9)  Hypertension associated with diabetes ([place])  Plaque psoriasis  Polycythemia vera ([place]) - Plan: CBC with Differential/Platelet  Hyperlipidemia associated with type 2 diabetes mellitus ([place]) - Plan: Lipid panel Overall he seems to be doing well. Hopefully with this new program, this will help with his weight reduction  and eating habits. Discussed use of Lamisil and possible side effects. I will recheck blood work with his next visit. He will continue on all his other medications. Over 30 minutes spent discussing all these issues with him.

## 2015-09-23 MED FILL — DILTIAZEM 24HR ER 240 MG CA: 240 | 90 days supply | Qty: 90 | Fill #1

## 2015-09-23 MED FILL — BENAZEPRIL HCL 40 MG TABLET: 40 | 90 days supply | Qty: 90 | Fill #1

## 2015-10-07 MED FILL — TALTZ 80 MG/ML AUTOINJECTOR: 80 | 28 days supply | Qty: 1 | Fill #3

## 2015-10-10 ENCOUNTER — Other Ambulatory Visit: Payer: Self-pay | Admitting: Family Medicine

## 2015-10-10 MED FILL — METFORMIN HCL ER 500 MG TAB: 500 | 30 days supply | Qty: 60 | Fill #0

## 2015-10-13 DIAGNOSIS — G4733 Obstructive sleep apnea (adult) (pediatric): Secondary | ICD-10-CM | POA: Diagnosis not present

## 2015-10-29 DIAGNOSIS — Z79899 Other long term (current) drug therapy: Secondary | ICD-10-CM | POA: Diagnosis not present

## 2015-10-29 DIAGNOSIS — L400 Psoriasis vulgaris: Secondary | ICD-10-CM | POA: Diagnosis not present

## 2015-11-07 ENCOUNTER — Other Ambulatory Visit: Payer: Self-pay | Admitting: Family Medicine

## 2015-11-07 MED ORDER — TRUERESULT BLOOD GLUCOSE W/DEVICE KIT: 1.0000 | PACK | Freq: Two times a day (BID) | Status: DC

## 2015-11-07 MED FILL — TALTZ 80 MG/ML AUTOINJECTOR: 80 | 28 days supply | Qty: 1 | Fill #4

## 2015-11-07 MED FILL — METFORMIN HCL ER 500 MG TAB: 500 | 90 days supply | Qty: 180 | Fill #1

## 2015-11-07 MED FILL — SM ALCOHOL 70% PREP PADS: 70 | 90 days supply | Qty: 100 | Fill #1

## 2015-11-07 NOTE — Addendum Note (Signed)
Addended by: Minette Headland A on: 11/07/2015 08:45 AM   Modules accepted: Orders

## 2015-11-10 DIAGNOSIS — G4733 Obstructive sleep apnea (adult) (pediatric): Secondary | ICD-10-CM | POA: Diagnosis not present

## 2015-11-15 ENCOUNTER — Encounter: Payer: Self-pay | Admitting: Medical

## 2015-11-15 ENCOUNTER — Telehealth: Payer: Self-pay | Admitting: Medical

## 2015-11-15 ENCOUNTER — Ambulatory Visit (INDEPENDENT_AMBULATORY_CARE_PROVIDER_SITE_OTHER): Payer: Self-pay | Admitting: Medical

## 2015-11-15 VITALS — BP 138/82 | HR 93 | Ht 69.0 in | Wt 279.0 lb

## 2015-11-15 DIAGNOSIS — I152 Hypertension secondary to endocrine disorders: Secondary | ICD-10-CM

## 2015-11-15 DIAGNOSIS — E1159 Type 2 diabetes mellitus with other circulatory complications: Secondary | ICD-10-CM

## 2015-11-15 DIAGNOSIS — I10 Essential (primary) hypertension: Secondary | ICD-10-CM

## 2015-11-15 DIAGNOSIS — E785 Hyperlipidemia, unspecified: Secondary | ICD-10-CM

## 2015-11-15 DIAGNOSIS — E119 Type 2 diabetes mellitus without complications: Secondary | ICD-10-CM

## 2015-11-15 DIAGNOSIS — G4733 Obstructive sleep apnea (adult) (pediatric): Secondary | ICD-10-CM

## 2015-11-15 DIAGNOSIS — E1169 Type 2 diabetes mellitus with other specified complication: Secondary | ICD-10-CM

## 2015-11-15 DIAGNOSIS — Z8673 Personal history of transient ischemic attack (TIA), and cerebral infarction without residual deficits: Secondary | ICD-10-CM

## 2015-11-15 DIAGNOSIS — Z0289 Encounter for other administrative examinations: Secondary | ICD-10-CM | POA: Insufficient documentation

## 2015-11-15 NOTE — Patient Instructions (Signed)
I recommend a follow up with Dr. Redmond School to reassess BP and get and updated EKG.   You have a hx/o arrhythmia and I think it would be wise to have an updated EKG.  Also, your BP barely qualifies today, so it may be time to adjust your BP medication.

## 2015-11-15 NOTE — Progress Notes (Signed)
Commercial Driver Medical Examination   [name] is a 55 y.o. male who presents today for a commercial driver fitness determination physical exam.  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   Last DOT physical 2016 here with me.  Medical care team includes:  Dr. Redmond School here for primary care since 1976.  Review of Systems A comprehensive review of systems was reviewed and noted as below:  Eye: +uses reading glasses, but otherwise no corrective lenses, -lasik surgery or other eye surgery, -glaucoma, -cataracts, -macular degeneration, -monocular vision, -medication for eye condition, -blurred vision,   Ears: -hearing problems, - hearing aids, -ear pain, -ear drainage, -ear fullness, -tinnitus, -recurrent ear infection, -previous ear surgery, - vertigo, -meniere's disease  Endocrine: -polydipsia, -polyuria, +intentional weight loss, -fainting, -dizziness, - altered or loss of consciousness, -hypoglycemia  Cardiovascular: +heart disease, -CHF, -heart attack, -cardiac stents, -bypass surgery, +other heart surgery/prior procedure for PFO, +hypertension, -blood clots, -pacemaker, -medications for heart condition, -chest pain, -SOB, -palpitations, -fainting, -dizziness, -dyspnea  Respiratory: -asthma, -COPD, other lung disease, -smoker, -chest tightness, - wheezing, -snoring, -daytime sleepiness, +sleep apnea/uses CPAP, -narcolepsy  Allergy: -uncontrollable sneezing or allergy symptoms  Musculoskeletal: -missing body parts, -muscle disease, -bone disease, -spine injury, -low back pain, -medication for joints, bones, muscles or pain, -physical limitations, -joint pain, -neck pain, -limitations of neck ROM, -back surgery, +left hip replacement,  +hx/o psoriasis, -gout  Neurologic: -neurologic disease, -dementia, -seizures, -parkinson's, -tremor, -memory problems, -weakness,  -numbness, -tingling, -medication for neurologic condition, -medications for sleep condition  Gastric: -abdominal pain, -chronic diarrhea or IBS, -uncontrollable nausea  Kidney/Renal: -hematuria, -dialysis, kidney disease, polycystic kidney disease  Psychiatric: -homicidal thoughts, -suicidal thoughts, -prior suicide attempts, -get into fights/hurting others, -memory or concentration problems, -delusions, -hallucinations, -hospitalization for mental health problem, -depression, -anxiety, -bipolar  Drug use: - none  Reviewed their medical, surgical, family, social, medication, and allergy history and updated chart as appropriate.     Objective:   Physical Exam  BP 140/100 mmHg  Pulse 93  Ht 5\' 9"  (1.753 m)  Wt 279 lb (126.554 kg)  BMI 41.18 kg/m2  General appearance: alert, no distress, WD/WN, obese white male Skin: no worrisome findings otherwise  HEENT: normocephalic, conjunctiva/corneas normal, sclerae anicteric, PERRLA, EOMi, nares patent, no discharge or erythema, pharynx normal Oral cavity: MMM, tongue normal, teeth in good repair Neck: supple, no lymphadenopathy, no thyromegaly, no masses, normal ROM, no bruits Chest: non tender, normal shape and expansion Heart: RRR, normal S1, S2, no murmurs Lungs: CTA bilaterally, no wheezes, rhonchi, or rales Abdomen: +bs, soft, obese, non tender, non distended, no masses, no hepatomegaly, no splenomegaly, no bruits Back: non tender, normal ROM, no scoliosis Musculoskeletal: left buttock/posterior hip surgical scar, left forearm dorsal with diagonal long scar from childhood trauma, otherwise upper extremities non tender, no obvious deformity, normal ROM throughout, lower extremities non tender, no obvious deformity, normal ROM throughout Extremities: no edema, no cyanosis, no clubbing Pulses: 2+ symmetric, upper and lower extremities, normal cap refill Neurological: alert, oriented x 3, CN2-12 intact, strength normal upper extremities and  lower extremities, sensation normal throughout, DTRs 2+ throughout, no cerebellar signs, gait normal Psychiatric: normal affect, behavior normal, pleasant  GU: no hernia Rectal: deferred  Vision, hearing, urinalysis reviewed.    Assessment:   Encounter Diagnoses  Name Primary?  . Encounter for occupational health examination Yes  . Hx-TIA (transient ischemic attack)   .  Obstructive sleep apnea   . Hyperlipidemia associated with type 2 diabetes mellitus ([place])   . Hypertension associated with diabetes ([place])   . Type 2 diabetes mellitus without complication, without long-term current use of insulin (HCC)     Plan:   DOT evaluation - Forms completed for DOT good for 1 year.  Hypertension - c/t same medication, recommended f/u with Dr. Redmond School for possible titration and updated EKG  Diabetes type 2-reviewed recent labs including recent hemoglobin A1c. He is compliant with medication, is using [facility] program to track steps, diabetes, and health goals  History of TIA with repair of PFO and no problems since.  I reviewed his last stress test which was from 2012, last cardiology notes from 2011.   OSA on CPAP -  He reports very good compliance and no particular complaint or symptom of sleep apnea at this time    Reviewed 2/ 2012 myocardial perfusion scan with no ischemia, 51% ejection fraction. The last cardiac report was reviewed from February 2011 with Dr. Gwenlyn Found.  At that time he was continued on Plavix, has history of PFO closure with STARFlex septal occluder and had subsequent paroxysmal atrial fibrillation felt to be related to the occluder.   Of note, eye exam reviewed from Dr. Jola Schmidt  Reviewed last 30 day compliance report showing 100% compliance.  F/u with Dr. Redmond School.

## 2015-11-15 NOTE — Telephone Encounter (Signed)
Copy DOT physical and eye exam from Dr. Jola Schmidt.   Once this is copied, then get DOT and eye doctor forms to patient.  DOT good for 1 year.

## 2015-11-18 NOTE — Telephone Encounter (Signed)
Copied forms put pts copy up front and the other copy in scan pile. LM for pt to pick up DOT and that it was good for one year

## 2015-11-18 NOTE — Telephone Encounter (Signed)
Do not have the forms, will check with Audelia Acton and in folders.

## 2015-11-25 DIAGNOSIS — G4733 Obstructive sleep apnea (adult) (pediatric): Secondary | ICD-10-CM | POA: Diagnosis not present

## 2015-12-03 MED FILL — TALTZ 80 MG/ML AUTOINJECTOR: 80 | 28 days supply | Qty: 1 | Fill #5

## 2015-12-04 MED FILL — CLOPIDOGREL 75 MG TABLET: 75 | 90 days supply | Qty: 90 | Fill #3

## 2015-12-04 MED FILL — TERBINAFINE HCL 250 MG TAB: 250 | 90 days supply | Qty: 90 | Fill #1

## 2015-12-11 DIAGNOSIS — G4733 Obstructive sleep apnea (adult) (pediatric): Secondary | ICD-10-CM | POA: Diagnosis not present

## 2015-12-19 ENCOUNTER — Other Ambulatory Visit: Payer: Self-pay | Admitting: Family Medicine

## 2015-12-19 MED ORDER — SCOPOLAMINE 1 MG/3DAYS TD PT72: 1.0000 | MEDICATED_PATCH | TRANSDERMAL | Status: DC

## 2015-12-19 MED FILL — TRANSDERM-SCOP 1.5 MG/72HR: 1 | 3 days supply | Qty: 1 | Fill #0

## 2015-12-23 MED FILL — PANTOPRAZOLE SOD DR 40 MG T: 40 | 90 days supply | Qty: 90 | Fill #2

## 2015-12-23 MED FILL — CARTIA XT 240 MG CAPSULE: 240 | 90 days supply | Qty: 90 | Fill #2

## 2015-12-23 MED FILL — ROSUVASTATIN CALCIUM 10 MG: 10 | 90 days supply | Qty: 90 | Fill #1

## 2015-12-23 MED FILL — BENAZEPRIL HCL 40 MG TABLET: 40 | 90 days supply | Qty: 90 | Fill #2

## 2015-12-31 MED FILL — TALTZ 80 MG/ML AUTOINJECTOR: 80 | 28 days supply | Qty: 1 | Fill #0

## 2016-01-10 DIAGNOSIS — G4733 Obstructive sleep apnea (adult) (pediatric): Secondary | ICD-10-CM | POA: Diagnosis not present

## 2016-01-13 ENCOUNTER — Encounter: Payer: Self-pay | Admitting: Family Medicine

## 2016-01-13 ENCOUNTER — Ambulatory Visit (INDEPENDENT_AMBULATORY_CARE_PROVIDER_SITE_OTHER): Payer: 59 | Admitting: Family Medicine

## 2016-01-13 VITALS — BP 122/80 | HR 82 | Ht 69.0 in | Wt 286.0 lb

## 2016-01-13 DIAGNOSIS — I152 Hypertension secondary to endocrine disorders: Secondary | ICD-10-CM

## 2016-01-13 DIAGNOSIS — K219 Gastro-esophageal reflux disease without esophagitis: Secondary | ICD-10-CM

## 2016-01-13 DIAGNOSIS — E1169 Type 2 diabetes mellitus with other specified complication: Secondary | ICD-10-CM

## 2016-01-13 DIAGNOSIS — E785 Hyperlipidemia, unspecified: Secondary | ICD-10-CM

## 2016-01-13 DIAGNOSIS — G4733 Obstructive sleep apnea (adult) (pediatric): Secondary | ICD-10-CM

## 2016-01-13 DIAGNOSIS — E1159 Type 2 diabetes mellitus with other circulatory complications: Secondary | ICD-10-CM | POA: Diagnosis not present

## 2016-01-13 DIAGNOSIS — B351 Tinea unguium: Secondary | ICD-10-CM

## 2016-01-13 DIAGNOSIS — E119 Type 2 diabetes mellitus without complications: Secondary | ICD-10-CM | POA: Diagnosis not present

## 2016-01-13 DIAGNOSIS — E669 Obesity, unspecified: Secondary | ICD-10-CM

## 2016-01-13 DIAGNOSIS — I10 Essential (primary) hypertension: Secondary | ICD-10-CM

## 2016-01-13 LAB — POCT GLYCOSYLATED HEMOGLOBIN (HGB A1C): HEMOGLOBIN A1C: 6.5

## 2016-01-13 NOTE — Patient Instructions (Signed)
Try either Flonase or Nasacort regularly for the next month and see if that will help to get off the Afrin

## 2016-01-13 NOTE — Progress Notes (Signed)
  Subjective:    Patient ID: [name], male    DOB: November 29, 1960, 55 y.o.   MRN: [id number]  [name] is a 55 y.o. male who presents for follow-up of Type 2 diabetes mellitus.  Patient is checking home blood sugars.   Home blood sugar records high 130 low 98 How often is blood sugars being checked: once a day Current symptoms none Daily foot checks: yes  Any foot concerns: none Last eye exam: 04/29/15 Exercise: walking  he continues to use his CPAP and is quite happy with the results. He is seeing good results with using the Lamisil for his onychomycosis. His reflux seems to be under good control. The following portions of the patient's history were reviewed and updated as appropriate: allergies, current medications, past medical history, past social history and problem list.  ROS as in subjective above.     Objective:    Physical Exam Alert and in no distress otherwise not examined.  There were no vitals taken for this visit.  Lab Review Diabetic Labs Latest Ref Rng 09/12/2015 05/22/2015 01/24/2015 01/16/2015 10/09/2014  HbA1c - 6.1 6.0 - 5.5% -  Chol 125 - 200 mg/dL 127 - - - -  HDL >=40 mg/dL 41 - - - -  Calc LDL <130 mg/dL 45 - - - -  Triglycerides <150 mg/dL 207(H) - - - -  Creatinine 0.7 - 1.3 mg/dL - 1.1 1.1 - 1.1   BP/Weight 11/15/2015 09/12/2015 09/10/2015 08/09/2015 [id number]  Systolic BP 0000000 [id number] 0000000 [id number]  Diastolic BP 82 92 84 80 90  Wt. (Lbs) 279 283.4 285.2 289 287.2  BMI 41.18 41.83 39.8 40.33 40.07   Foot/eye exam completion dates Latest Ref Rng 05/22/2015 04/29/2015  Eye Exam No Retinopathy - No Retinopathy  Foot Form Completion - Done -   A1c is 6.5  [name]  reports that he has never smoked. He has never used smokeless tobacco. He reports that he drinks alcohol. He reports that he does not use illicit drugs.     Assessment & Plan:    Type 2 diabetes mellitus without complication, without long-term current use of insulin (HCC) - Plan: POCT  glycosylated hemoglobin (Hb A1C)  Obesity (BMI 30-39.9)  Hyperlipidemia associated with type 2 diabetes mellitus ([place])  Hypertension associated with diabetes ([place])  Obstructive sleep apnea  Gastroesophageal reflux disease without esophagitis  Onychomycosis   1. Rx changes: none 2. Education: Reviewed 'ABCs' of diabetes management (respective goals in parentheses):  A1C (<7), blood pressure (<130/80), and cholesterol (LDL <100). 3. Compliance at present is estimated to be good. Efforts to improve compliance (if necessary) will be directed at  continue to work on weight reduction.. 4. Follow up: 4 months

## 2016-01-14 ENCOUNTER — Ambulatory Visit: Payer: Self-pay

## 2016-01-30 ENCOUNTER — Ambulatory Visit: Payer: Self-pay

## 2016-02-03 MED FILL — METFORMIN HCL ER 500 MG TAB: 500 | 90 days supply | Qty: 180 | Fill #2

## 2016-02-07 ENCOUNTER — Ambulatory Visit: Payer: 59 | Attending: Internal Medicine | Admitting: Pharmacist

## 2016-02-07 DIAGNOSIS — L4050 Arthropathic psoriasis, unspecified: Secondary | ICD-10-CM

## 2016-02-07 MED ORDER — IXEKIZUMAB 80 MG/ML ~~LOC~~ SOAJ: 80.0000 mg | SUBCUTANEOUS | Status: DC

## 2016-02-07 MED FILL — TALTZ 80 MG/ML AUTOINJECTOR: 80 | 30 days supply | Qty: 1 | Fill #0

## 2016-02-07 NOTE — Progress Notes (Signed)
S: Patient presents today to the [facility].  Patient is currently taking Taltz for psoriatric arthritis. Patient is managed by Dr. Elvera Lennox for this.   Adherence: denies any missed doses.  Dosing:  SubQ:  80 mg every 4 weeks.  Drug-drug interactions: none  Screening: TB test: completed yearly, denies any positive test results Hepatitis: completed  Monitoring: S/sx of infection: denies CBC: last one from 1/17 was normal S/sx of hypersensitivity: denies S/sx of malignancy: denies S/sx of heart failure: denies  Patient reports that he was in the original trial for [place]. When the study was completed, he tried [place] but they were not as efficacious. He feels that Donnetta Hail is working very well for him.   O:     Lab Results  Component Value Date   WBC 6.7 09/12/2015   HGB 17.1* 09/12/2015   HCT 48.9 09/12/2015   MCV 83.2 09/12/2015   PLT 211 09/12/2015      Chemistry      Component Value Date/Time   NA 138 05/22/2015 1303   NA 133* 09/17/2014 0906   K 4.1 05/22/2015 1303   K 4.1 09/17/2014 0906   CL 97 09/17/2014 0906   CO2 23 05/22/2015 1303   CO2 23 09/17/2014 0906   BUN 17.4 05/22/2015 1303   BUN 13 09/17/2014 0906   CREATININE 1.1 05/22/2015 1303   CREATININE 1.01 09/17/2014 0906   CREATININE 0.87 11/05/2010 0504      Component Value Date/Time   CALCIUM 9.4 05/22/2015 1303   CALCIUM 9.9 09/17/2014 0906   ALKPHOS 73 05/22/2015 1303   ALKPHOS 57 09/17/2014 0906   AST 24 05/22/2015 1303   AST 26 09/17/2014 0906   ALT 30 05/22/2015 1303   ALT 43 09/17/2014 0906   BILITOT 0.77 05/22/2015 1303   BILITOT 1.4* 09/17/2014 0906       A/P: 1. Medication review: patient is on Taltz and tolerating it well without any adverse effects. Reviewed the medication, including injection technique, adverse effects, and efficacy. No recommendations for changes. Patient to follow up with Dr. Elvera Lennox as  directed.   Nicoletta Ba, PharmD, BCPS, [facility] [facility] [phone]  Evaluation and management procedures were performed by the Clinical Pharmacy Practitioner under my supervision and collaboration. I have reviewed the Practitioner's note and chart, and I agree with the management and plan as documented above.   Angelica Chessman, MD, [place], [place], [place], [place] and [place], [place]   02/07/2016, 4:02 PM

## 2016-02-10 DIAGNOSIS — G4733 Obstructive sleep apnea (adult) (pediatric): Secondary | ICD-10-CM | POA: Diagnosis not present

## 2016-02-23 DIAGNOSIS — G4733 Obstructive sleep apnea (adult) (pediatric): Secondary | ICD-10-CM | POA: Diagnosis not present

## 2016-03-04 ENCOUNTER — Other Ambulatory Visit: Payer: Self-pay | Admitting: Family Medicine

## 2016-03-04 MED FILL — TERBINAFINE HCL 250 MG TAB: 250 | 90 days supply | Qty: 90 | Fill #0

## 2016-03-04 NOTE — Telephone Encounter (Signed)
Is this okay to refill? 

## 2016-03-10 MED FILL — TALTZ 80 MG/ML AUTOINJECTOR: 80 | 30 days supply | Qty: 1 | Fill #1

## 2016-03-19 MED FILL — CARTIA XT 240 MG CAPSULE: 240 | 90 days supply | Qty: 90 | Fill #3

## 2016-03-19 MED FILL — BENAZEPRIL HCL 40 MG TABLET: 40 | 90 days supply | Qty: 90 | Fill #3

## 2016-04-03 MED FILL — TALTZ 80 MG/ML AUTOINJECTOR: 80 | 30 days supply | Qty: 1 | Fill #2

## 2016-05-01 MED FILL — METFORMIN HCL ER 500 MG TAB: 500 | 90 days supply | Qty: 180 | Fill #3

## 2016-05-05 DIAGNOSIS — L603 Nail dystrophy: Secondary | ICD-10-CM | POA: Diagnosis not present

## 2016-05-05 DIAGNOSIS — L400 Psoriasis vulgaris: Secondary | ICD-10-CM | POA: Diagnosis not present

## 2016-05-05 DIAGNOSIS — Z79899 Other long term (current) drug therapy: Secondary | ICD-10-CM | POA: Diagnosis not present

## 2016-05-06 MED FILL — TALTZ 80 MG/ML AUTOINJECTOR: 80 | 30 days supply | Qty: 1 | Fill #3

## 2016-05-15 ENCOUNTER — Encounter: Payer: Self-pay | Admitting: Family Medicine

## 2016-05-15 DIAGNOSIS — H524 Presbyopia: Secondary | ICD-10-CM | POA: Diagnosis not present

## 2016-05-15 LAB — HM DIABETES EYE EXAM

## 2016-05-26 ENCOUNTER — Other Ambulatory Visit: Payer: Self-pay | Admitting: Family Medicine

## 2016-05-26 ENCOUNTER — Ambulatory Visit (INDEPENDENT_AMBULATORY_CARE_PROVIDER_SITE_OTHER): Payer: 59 | Admitting: Family Medicine

## 2016-05-26 ENCOUNTER — Encounter: Payer: Self-pay | Admitting: Family Medicine

## 2016-05-26 VITALS — BP 124/82 | HR 50 | Ht 69.0 in | Wt 290.0 lb

## 2016-05-26 DIAGNOSIS — E1169 Type 2 diabetes mellitus with other specified complication: Secondary | ICD-10-CM | POA: Diagnosis not present

## 2016-05-26 DIAGNOSIS — E1159 Type 2 diabetes mellitus with other circulatory complications: Secondary | ICD-10-CM

## 2016-05-26 DIAGNOSIS — E785 Hyperlipidemia, unspecified: Secondary | ICD-10-CM

## 2016-05-26 DIAGNOSIS — I10 Essential (primary) hypertension: Secondary | ICD-10-CM | POA: Diagnosis not present

## 2016-05-26 DIAGNOSIS — Z23 Encounter for immunization: Secondary | ICD-10-CM | POA: Diagnosis not present

## 2016-05-26 DIAGNOSIS — L4050 Arthropathic psoriasis, unspecified: Secondary | ICD-10-CM | POA: Diagnosis not present

## 2016-05-26 DIAGNOSIS — I152 Hypertension secondary to endocrine disorders: Secondary | ICD-10-CM

## 2016-05-26 DIAGNOSIS — D45 Polycythemia vera: Secondary | ICD-10-CM | POA: Diagnosis not present

## 2016-05-26 DIAGNOSIS — E119 Type 2 diabetes mellitus without complications: Secondary | ICD-10-CM

## 2016-05-26 DIAGNOSIS — E669 Obesity, unspecified: Secondary | ICD-10-CM | POA: Diagnosis not present

## 2016-05-26 DIAGNOSIS — G4733 Obstructive sleep apnea (adult) (pediatric): Secondary | ICD-10-CM | POA: Diagnosis not present

## 2016-05-26 LAB — CBC WITH DIFFERENTIAL/PLATELET
BASOS PCT: 0 %
Basophils Absolute: 0 cells/uL (ref 0–200)
EOS ABS: 126 {cells}/uL (ref 15–500)
Eosinophils Relative: 2 %
HEMATOCRIT: 51.5 % — AB (ref 38.5–50.0)
HEMOGLOBIN: 18.4 g/dL — AB (ref 13.2–17.1)
LYMPHS ABS: 2520 {cells}/uL (ref 850–3900)
Lymphocytes Relative: 40 %
MCH: 30.2 pg (ref 27.0–33.0)
MCHC: 35.7 g/dL (ref 32.0–36.0)
MCV: 84.4 fL (ref 80.0–100.0)
MONO ABS: 504 {cells}/uL (ref 200–950)
MPV: 9.3 fL (ref 7.5–12.5)
Monocytes Relative: 8 %
Neutro Abs: 3150 cells/uL (ref 1500–7800)
Neutrophils Relative %: 50 %
Platelets: 193 10*3/uL (ref 140–400)
RBC: 6.1 MIL/uL — AB (ref 4.20–5.80)
RDW: 15.3 % — ABNORMAL HIGH (ref 11.0–15.0)
WBC: 6.3 10*3/uL (ref 4.0–10.5)

## 2016-05-26 LAB — POCT GLYCOSYLATED HEMOGLOBIN (HGB A1C): Hemoglobin A1C: 6.4

## 2016-05-26 MED FILL — PANTOPRAZOLE SOD DR 40 MG T: 40 | 90 days supply | Qty: 90 | Fill #0

## 2016-05-26 MED FILL — TERBINAFINE HCL 250 MG TAB: 250 | 90 days supply | Qty: 90 | Fill #1

## 2016-05-26 NOTE — Progress Notes (Signed)
Subjective:    Patient ID: [name], male    DOB: [birth year], 55 y.o.   MRN: [id number]  [name] is a 55 y.o. male who presents for follow-up of Type 2 diabetes mellitus.  Patient is checking home blood sugars.   Home blood sugar records: 60 to 180 How often is blood sugars being checked: once a day Current symptoms/problems none Daily foot checks: yes  Any foot concerns: none Last eye exam: 05/15/2016 all good Exercise 8,000 to 10,000 steps a day He is also being followed through the [facility] diabetes program. He does have concerns over possible nocturnal hypoxia. Apparently when he was suddenly in the hospital they did note several episodes of low oxygenation in the middle of the night. He would like follow-up concerning this. He is also not had blood work and a proximally 6 months for further evaluation of his polycythemia. He continues on medications listed in the chart. They were reviewed. He does state that the psoriatic arthritis is doing much better since he is now on medication and states his skin is almost clear. He states that he does have some arthritic symptoms but is not sure whether the psoriasis medicine is really helping this. The following portions of the patient's history were reviewed and updated as appropriate: allergies, current medications, past medical history, past social history and problem list.  ROS as in subjective above.     Objective:    Physical Exam Alert and in no distressHis skin does look much clearer than in the past. Hemoglobin A1c is 6.4   Lab Review Diabetic Labs Latest Ref Rng & Units 01/13/2016 09/12/2015 05/22/2015 01/24/2015 01/16/2015  HbA1c - 6.5 6.1 6.0 - 5.5%  Microalbumin mg/L - - - - -  Micro/Creat Ratio - - - - - -  Chol 125 - 200 mg/dL - 127 - - -  HDL >=40 mg/dL - 41 - - -  Calc LDL <130 mg/dL - 45 - - -  Triglycerides <150 mg/dL - 207(H) - - -  Creatinine 0.7 - 1.3 mg/dL - - 1.1 1.1 -   BP/Weight 01/13/2016  11/15/2015 09/12/2015 09/10/2015 [id number]  Systolic BP 123XX123 0000000 [id number] 0000000 [id number]  Diastolic BP 80 82 92 84 80  Wt. (Lbs) 286 279 283.4 285.2 289  BMI 42.22 41.18 41.83 39.8 40.33   Foot/eye exam completion dates Latest Ref Rng & Units 05/15/2016 05/22/2015  Eye Exam No Retinopathy No Retinopathy -  Foot Form Completion - - Done    [name]  reports that he has never smoked. He has never used smokeless tobacco. He reports that he drinks alcohol. He reports that he does not use drugs.     Assessment & Plan:    Obesity (BMI 30-39.9)  Hyperlipidemia associated with type 2 diabetes mellitus ([place])  Hypertension associated with diabetes ([place])  Type 2 diabetes mellitus without complication, without long-term current use of insulin ([place])  Obstructive sleep apnea  Psoriatic arthritis ([place])  Polycythemia vera ([place])    1. Rx changes: none 2. Education: Reviewed 'ABCs' of diabetes management (respective goals in parentheses):  A1C (<7), blood pressure (<130/80), and cholesterol (LDL <100). 3. Compliance at present is estimated to be good. Efforts to improve compliance (if necessary) will be directed at  4. Again stressed the need for him to make exercise and dietary changes. His diet habits are very erratic and exercise is intermittent.. 5. Follow up: 4 months  6. He will also be set up to do  nocturnal oximetry to see if he truly is becoming hypoxic at night.

## 2016-06-01 ENCOUNTER — Telehealth: Payer: Self-pay | Admitting: Medical Oncology

## 2016-06-01 ENCOUNTER — Other Ambulatory Visit: Payer: Self-pay | Admitting: Medical Oncology

## 2016-06-01 DIAGNOSIS — D45 Polycythemia vera: Secondary | ICD-10-CM

## 2016-06-01 NOTE — Telephone Encounter (Signed)
Wife calling stating Dr Redmond School wants pt to have phlebotomy for hct 51.5  Last visit sept 2016. Note to [place].

## 2016-06-03 ENCOUNTER — Other Ambulatory Visit (HOSPITAL_BASED_OUTPATIENT_CLINIC_OR_DEPARTMENT_OTHER): Payer: 59

## 2016-06-03 ENCOUNTER — Telehealth: Payer: Self-pay | Admitting: Internal Medicine

## 2016-06-03 ENCOUNTER — Ambulatory Visit (HOSPITAL_BASED_OUTPATIENT_CLINIC_OR_DEPARTMENT_OTHER): Payer: 59 | Admitting: Internal Medicine

## 2016-06-03 ENCOUNTER — Encounter: Payer: Self-pay | Admitting: Internal Medicine

## 2016-06-03 ENCOUNTER — Ambulatory Visit (HOSPITAL_BASED_OUTPATIENT_CLINIC_OR_DEPARTMENT_OTHER): Payer: 59

## 2016-06-03 VITALS — BP 129/103 | HR 97 | Temp 98.6°F | Resp 18 | Ht 69.0 in | Wt 295.8 lb

## 2016-06-03 DIAGNOSIS — D751 Secondary polycythemia: Secondary | ICD-10-CM

## 2016-06-03 DIAGNOSIS — D45 Polycythemia vera: Secondary | ICD-10-CM

## 2016-06-03 DIAGNOSIS — E785 Hyperlipidemia, unspecified: Secondary | ICD-10-CM

## 2016-06-03 LAB — CBC WITH DIFFERENTIAL/PLATELET
BASO%: 0.3 % (ref 0.0–2.0)
Basophils Absolute: 0 10*3/uL (ref 0.0–0.1)
EOS ABS: 0.2 10*3/uL (ref 0.0–0.5)
EOS%: 2 % (ref 0.0–7.0)
HEMATOCRIT: 46.3 % (ref 38.4–49.9)
HEMOGLOBIN: 16.9 g/dL (ref 13.0–17.1)
LYMPH%: 36.3 % (ref 14.0–49.0)
MCH: 29.9 pg (ref 27.2–33.4)
MCHC: 36.5 g/dL — ABNORMAL HIGH (ref 32.0–36.0)
MCV: 81.8 fL (ref 79.3–98.0)
MONO#: 0.7 10*3/uL (ref 0.1–0.9)
MONO%: 8.4 % (ref 0.0–14.0)
NEUT%: 53 % (ref 39.0–75.0)
NEUTROS ABS: 4.2 10*3/uL (ref 1.5–6.5)
PLATELETS: 180 10*3/uL (ref 140–400)
RBC: 5.66 10*6/uL (ref 4.20–5.82)
RDW: 14.6 % (ref 11.0–14.6)
WBC: 8 10*3/uL (ref 4.0–10.3)
lymph#: 2.9 10*3/uL (ref 0.9–3.3)

## 2016-06-03 NOTE — Progress Notes (Signed)
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 Negative MPL515 mutation, Negative BCR/ABL and negative JAK-2 exon 12.  PRIOR THERAPY: None  CURRENT THERAPY: Phlebotomy on as-needed basis.  INTERVAL HISTORY: [name] 55 y.o. male returns to the clinic today for follow-up visit. He is currently on phlebotomy on as-needed basis and tolerated it well. He was last seen here ago. He is feeling fine today with no specific complaints. He was seen recently by his primary care physician and repeat CBC showed elevation of his hematocrit of 51.5%. He was referred back to me today for evaluation. He denied having any significant chest pain, shortness of breath, cough or hemoptysis. The patient denied having any significant weight loss or night sweats, no fever or chills. He has repeat CBC performed earlier today and he is here today for evaluation and discussion of his lab results and recommendation regarding his condition.   MEDICAL HISTORY: Past Medical History:  Diagnosis Date  . Arthritis   . Diverticulosis   . Dyslipidemia   . GERD (gastroesophageal reflux disease)   . Herpes labialis   . Hypertension   . Metabolic syndrome   . Obesity   . Proteinuria   . Psoriasis   . Seborrheic dermatitis   . Sleep apnea   . TIA (transient ischemic attack) 2009    ALLERGIES:  has No Known Allergies.  MEDICATIONS:  Current Outpatient Prescriptions  Medication Sig Dispense Refill  . aspirin 81 MG tablet Take 81 mg by mouth daily.    . benazepril (LOTENSIN) 40 MG tablet TAKE 1 TABLET BY MOUTH DAILY. 90 tablet 3  . Blood Glucose Monitoring Suppl (TRUERESULT BLOOD GLUCOSE) w/Device KIT 1 each by Does not apply route 2 (two) times daily. 1 each 0  . clopidogrel (PLAVIX)  75 MG tablet TAKE 1 TABLET BY MOUTH DAILY. (Patient taking differently: TAKE 1 TABLET BY MOUTH every other day) 90 tablet 3  . diltiazem (CARDIZEM CD) 240 MG 24 hr capsule TAKE 1 CAPSULE BY MOUTH DAILY. (Patient taking differently: No sig reported) 90 capsule 3  . Glucose Blood (BLOOD GLUCOSE TEST STRIPS) STRP PATIENT IS TO TEST BID DX:E11.9 (THIS IS FOR THE TRUE RESULTS) 100 each 12  . Ixekizumab (TALTZ) 80 MG/ML SOAJ Inject 80 mg into the skin every 30 (thirty) days. 1 mL 9  . LANCETS MICRO THIN 33G MISC PATIENT IS TO TEST BID USE AS DIRECTED DX:E11.9 (THIS IS FOR TRUE RESULT) 100 each 12  . metFORMIN (GLUCOPHAGE-XR) 500 MG 24 hr tablet TAKE 1 TABLET BY MOUTH TWICE DAILY 60 tablet PRN  . pantoprazole (PROTONIX) 40 MG tablet TAKE 1 TABLET BY MOUTH DAILY. 90 tablet 3  . rosuvastatin (CRESTOR) 10 MG tablet TAKE 1 TABLET BY MOUTH DAILY. (Patient taking differently: Takes every other day) 90 tablet 3  . terbinafine (LAMISIL) 250 MG tablet TAKE 1 TABLET BY MOUTH DAILY. 90 tablet 1  . Vitamin D, Cholecalciferol, 1000 units CAPS Take 1 capsule by mouth daily.     No current facility-administered medications for this visit.     SURGICAL HISTORY:  Past Surgical History:  Procedure Laterality Date  . COLONOSCOPY    . JOINT REPLACEMENT  11/03/10   LT HIP  . TONSILLECTOMY AND ADENOIDECTOMY    . TOTAL HIP ARTHROPLASTY Left   . WISDOM  TOOTH EXTRACTION      REVIEW OF SYSTEMS:  A comprehensive review of systems was negative.   PHYSICAL EXAMINATION: General appearance: alert, cooperative and no distress Head: Normocephalic, without obvious abnormality, atraumatic Neck: no adenopathy, no JVD, supple, symmetrical, trachea midline and thyroid not enlarged, symmetric, no tenderness/mass/nodules Lymph nodes: Cervical, supraclavicular, and axillary nodes normal. Resp: clear to auscultation bilaterally Back: symmetric, no curvature. ROM normal. No CVA tenderness. Cardio: regular rate and rhythm, S1, S2  normal, no murmur, click, rub or gallop GI: soft, non-tender; bowel sounds normal; no masses,  no organomegaly Extremities: extremities normal, atraumatic, no cyanosis or edema  ECOG PERFORMANCE STATUS: 0 - Asymptomatic  Blood pressure (!) 129/103, pulse 97, temperature 98.6 F (37 C), temperature source Oral, resp. rate 18, height 5' 9"  (1.753 m), weight 295 lb 12.8 oz (134.2 kg), SpO2 98 %.  LABORATORY DATA: Lab Results  Component Value Date   WBC 8.0 06/03/2016   HGB 16.9 06/03/2016   HCT 46.3 06/03/2016   MCV 81.8 06/03/2016   PLT 180 06/03/2016      Chemistry      Component Value Date/Time   NA 138 05/22/2015 1303   K 4.1 05/22/2015 1303   CL 97 09/17/2014 0906   CO2 23 05/22/2015 1303   BUN 17.4 05/22/2015 1303   CREATININE 1.1 05/22/2015 1303      Component Value Date/Time   CALCIUM 9.4 05/22/2015 1303   ALKPHOS 73 05/22/2015 1303   AST 24 05/22/2015 1303   ALT 30 05/22/2015 1303   BILITOT 0.77 05/22/2015 1303       RADIOGRAPHIC STUDIES: No results found.  ASSESSMENT AND PLAN: This is a very pleasant 55 years old white male with polycythemia highly suspicious for polycythemia vera but with negative JAK mutation. His hematocrit today is 46.3%.It was higher up to 51.5% at his primary care physician's office last week. I discussed the lab result with the patient today. I recommended for him to proceed with phlebotomy today. I will see him on as-needed basis with repeat phlebotomy if routine blood work at his primary care physician showed elevation of his hematocrit over 50%. For the dyslipidemia, the patient is currently on treatment with Crestor by his primary care physician. The patient voices understanding of current disease status and treatment options and is in agreement with the current care plan.  All questions were answered. The patient knows to call the clinic with any problems, questions or concerns. We can certainly see the patient much sooner if  necessary.   Disclaimer: This note was dictated with voice recognition software. Similar sounding words can inadvertently be transcribed and may not be corrected upon review.

## 2016-06-03 NOTE — Telephone Encounter (Signed)
No 10/4 los/orders/referrals.

## 2016-06-03 NOTE — Progress Notes (Signed)
88mls removed between 1509 -1519 on the third stick.  Another 460grams removed from left [facility] by Diane, RN (4th stick).  Pt monitored for 30 minutes then discharged to home after snack and drink.  Pt verbalizes that he will recheck his blood pressure at home and notify his cardiologist if DBP continues to be > 90.

## 2016-06-03 NOTE — Patient Instructions (Signed)

## 2016-06-04 MED FILL — TALTZ 80 MG/ML AUTOINJECTOR: 80 | 30 days supply | Qty: 1 | Fill #4

## 2016-06-18 ENCOUNTER — Telehealth: Payer: Self-pay | Admitting: Family Medicine

## 2016-06-18 MED ORDER — DILTIAZEM HCL ER COATED BEADS 240 MG PO CP24: 240.0000 mg | ORAL_CAPSULE | Freq: Every day | ORAL | 3 refills | Status: DC

## 2016-06-18 MED ORDER — CLOPIDOGREL BISULFATE 75 MG PO TABS: 75.0000 mg | ORAL_TABLET | Freq: Every day | ORAL | 3 refills | Status: DC

## 2016-06-18 MED ORDER — BENAZEPRIL HCL 40 MG PO TABS: 40.0000 mg | ORAL_TABLET | Freq: Every day | ORAL | 3 refills | Status: DC

## 2016-06-18 MED FILL — CLOPIDOGREL 75 MG TABLET: 75 | 90 days supply | Qty: 90 | Fill #0

## 2016-06-18 MED FILL — CARTIA XT 240 MG CAPSULE: 240 | 90 days supply | Qty: 90 | Fill #0

## 2016-06-18 MED FILL — BENAZEPRIL HCL 40 MG TABLET: 40 | 90 days supply | Qty: 90 | Fill #0

## 2016-06-18 NOTE — Telephone Encounter (Signed)
Rcvd refill request for Clopidoggrl 75 mg #90, Benazepril 40 mg #90, Cartia XT 240 mg #90

## 2016-07-01 MED FILL — AMOXICILLIN 500 MG CAPSULE: 500 | 2 days supply | Qty: 8 | Fill #0

## 2016-07-03 ENCOUNTER — Encounter: Payer: Self-pay | Admitting: Family Medicine

## 2016-07-03 NOTE — Progress Notes (Signed)
He recently had pulse oximetry done to evaluate for possible cause of his elevated hemoglobin. The overnight pulse oximetry was essentially negative having only one incident of an oxygenation of less than 88%.

## 2016-07-06 MED FILL — TALTZ 80 MG/ML AUTOINJECTOR: 80 | 30 days supply | Qty: 1 | Fill #5

## 2016-07-24 ENCOUNTER — Other Ambulatory Visit: Payer: Self-pay | Admitting: Family Medicine

## 2016-07-24 MED FILL — METFORMIN HCL ER 500 MG TAB: 500 | 90 days supply | Qty: 180 | Fill #4

## 2016-07-27 MED FILL — ROSUVASTATIN CALCIUM 10 MG: 10 | 90 days supply | Qty: 90 | Fill #0

## 2016-08-05 MED FILL — TALTZ 80 MG/ML AUTOINJECTOR: 80 | 30 days supply | Qty: 1 | Fill #6

## 2016-08-21 ENCOUNTER — Encounter: Payer: Self-pay | Admitting: Family Medicine

## 2016-08-25 DIAGNOSIS — G4733 Obstructive sleep apnea (adult) (pediatric): Secondary | ICD-10-CM | POA: Diagnosis not present

## 2016-09-04 MED FILL — TALTZ 80 MG/ML AUTOINJECTOR: 80 | 30 days supply | Qty: 1 | Fill #7

## 2016-09-15 ENCOUNTER — Encounter: Payer: 59 | Admitting: Family Medicine

## 2016-09-18 MED FILL — BENAZEPRIL HCL 40 MG TABLET: 40 | 90 days supply | Qty: 90 | Fill #1

## 2016-09-18 MED FILL — CARTIA XT 240 MG CAPSULE: 240 | 90 days supply | Qty: 90 | Fill #1

## 2016-09-26 MED FILL — TALTZ 80 MG/ML AUTOINJECTOR: 80 | 30 days supply | Qty: 1 | Fill #8

## 2016-10-13 ENCOUNTER — Encounter: Payer: Self-pay | Admitting: Cardiovascular Disease

## 2016-10-13 ENCOUNTER — Encounter: Payer: Self-pay | Admitting: Family Medicine

## 2016-10-13 ENCOUNTER — Telehealth: Payer: Self-pay | Admitting: Pharmacist Clinician (PhC)/ Clinical Pharmacy Specialist

## 2016-10-13 ENCOUNTER — Ambulatory Visit (INDEPENDENT_AMBULATORY_CARE_PROVIDER_SITE_OTHER): Payer: 59 | Admitting: Family Medicine

## 2016-10-13 ENCOUNTER — Ambulatory Visit (INDEPENDENT_AMBULATORY_CARE_PROVIDER_SITE_OTHER): Payer: 59 | Admitting: Cardiovascular Disease

## 2016-10-13 VITALS — BP 150/100 | HR 40 | Ht 69.0 in | Wt 285.0 lb

## 2016-10-13 VITALS — BP 176/88 | HR 55 | Ht 69.0 in | Wt 287.8 lb

## 2016-10-13 DIAGNOSIS — E1169 Type 2 diabetes mellitus with other specified complication: Secondary | ICD-10-CM

## 2016-10-13 DIAGNOSIS — I10 Essential (primary) hypertension: Secondary | ICD-10-CM

## 2016-10-13 DIAGNOSIS — Z125 Encounter for screening for malignant neoplasm of prostate: Secondary | ICD-10-CM

## 2016-10-13 DIAGNOSIS — D45 Polycythemia vera: Secondary | ICD-10-CM

## 2016-10-13 DIAGNOSIS — K219 Gastro-esophageal reflux disease without esophagitis: Secondary | ICD-10-CM | POA: Diagnosis not present

## 2016-10-13 DIAGNOSIS — E669 Obesity, unspecified: Secondary | ICD-10-CM

## 2016-10-13 DIAGNOSIS — E1121 Type 2 diabetes mellitus with diabetic nephropathy: Secondary | ICD-10-CM

## 2016-10-13 DIAGNOSIS — E119 Type 2 diabetes mellitus without complications: Secondary | ICD-10-CM | POA: Diagnosis not present

## 2016-10-13 DIAGNOSIS — L4050 Arthropathic psoriasis, unspecified: Secondary | ICD-10-CM | POA: Diagnosis not present

## 2016-10-13 DIAGNOSIS — E785 Hyperlipidemia, unspecified: Secondary | ICD-10-CM | POA: Diagnosis not present

## 2016-10-13 DIAGNOSIS — Z8673 Personal history of transient ischemic attack (TIA), and cerebral infarction without residual deficits: Secondary | ICD-10-CM | POA: Diagnosis not present

## 2016-10-13 DIAGNOSIS — I482 Chronic atrial fibrillation: Secondary | ICD-10-CM | POA: Diagnosis not present

## 2016-10-13 DIAGNOSIS — Z0000 Encounter for general adult medical examination without abnormal findings: Secondary | ICD-10-CM

## 2016-10-13 DIAGNOSIS — I152 Hypertension secondary to endocrine disorders: Secondary | ICD-10-CM

## 2016-10-13 DIAGNOSIS — E1159 Type 2 diabetes mellitus with other circulatory complications: Secondary | ICD-10-CM | POA: Diagnosis not present

## 2016-10-13 DIAGNOSIS — G4733 Obstructive sleep apnea (adult) (pediatric): Secondary | ICD-10-CM

## 2016-10-13 DIAGNOSIS — Z13220 Encounter for screening for lipoid disorders: Secondary | ICD-10-CM

## 2016-10-13 DIAGNOSIS — I4821 Permanent atrial fibrillation: Secondary | ICD-10-CM | POA: Insufficient documentation

## 2016-10-13 DIAGNOSIS — I4891 Unspecified atrial fibrillation: Secondary | ICD-10-CM

## 2016-10-13 DIAGNOSIS — E1122 Type 2 diabetes mellitus with diabetic chronic kidney disease: Secondary | ICD-10-CM | POA: Insufficient documentation

## 2016-10-13 LAB — CBC WITH DIFFERENTIAL/PLATELET
BASOS PCT: 0 %
Basophils Absolute: 0 cells/uL (ref 0–200)
EOS ABS: 234 {cells}/uL (ref 15–500)
Eosinophils Relative: 3 %
HEMATOCRIT: 51.8 % — AB (ref 38.5–50.0)
HEMOGLOBIN: 18.2 g/dL — AB (ref 13.2–17.1)
LYMPHS ABS: 2808 {cells}/uL (ref 850–3900)
LYMPHS PCT: 36 %
MCH: 30.3 pg (ref 27.0–33.0)
MCHC: 35.1 g/dL (ref 32.0–36.0)
MCV: 86.3 fL (ref 80.0–100.0)
MONO ABS: 702 {cells}/uL (ref 200–950)
MPV: 9.4 fL (ref 7.5–12.5)
Monocytes Relative: 9 %
NEUTROS PCT: 52 %
Neutro Abs: 4056 cells/uL (ref 1500–7800)
Platelets: 189 10*3/uL (ref 140–400)
RBC: 6 MIL/uL — AB (ref 4.20–5.80)
RDW: 15.2 % — AB (ref 11.0–15.0)
WBC: 7.8 10*3/uL (ref 4.0–10.5)

## 2016-10-13 LAB — COMPREHENSIVE METABOLIC PANEL
ALBUMIN: 4.2 g/dL (ref 3.6–5.1)
ALK PHOS: 66 U/L (ref 40–115)
ALT: 24 U/L (ref 9–46)
AST: 22 U/L (ref 10–35)
BILIRUBIN TOTAL: 1.1 mg/dL (ref 0.2–1.2)
BUN: 13 mg/dL (ref 7–25)
CALCIUM: 9.5 mg/dL (ref 8.6–10.3)
CO2: 24 mmol/L (ref 20–31)
Chloride: 99 mmol/L (ref 98–110)
Creat: 1.22 mg/dL (ref 0.70–1.33)
Glucose, Bld: 141 mg/dL — ABNORMAL HIGH (ref 65–99)
POTASSIUM: 4.4 mmol/L (ref 3.5–5.3)
Sodium: 134 mmol/L — ABNORMAL LOW (ref 135–146)
Total Protein: 6.9 g/dL (ref 6.1–8.1)

## 2016-10-13 LAB — POCT UA - MICROALBUMIN
Albumin/Creatinine Ratio, Urine, POC: >391.1
CREATININE, POC: 76.7 mg/dL

## 2016-10-13 LAB — POCT URINALYSIS DIPSTICK
BILIRUBIN UA: NEGATIVE
GLUCOSE UA: NEGATIVE
Ketones, UA: NEGATIVE
Leukocytes, UA: NEGATIVE
Nitrite, UA: NEGATIVE
RBC UA: NEGATIVE
SPEC GRAV UA: 1.02
Urobilinogen, UA: NEGATIVE
pH, UA: 6

## 2016-10-13 LAB — POCT GLYCOSYLATED HEMOGLOBIN (HGB A1C): HEMOGLOBIN A1C: 6.5

## 2016-10-13 LAB — LIPID PANEL
CHOL/HDL RATIO: 4.1 ratio (ref <5.0)
CHOLESTEROL: 150 mg/dL (ref <200)
HDL: 37 mg/dL — AB (ref >40)
LDL Cholesterol: 48 mg/dL (ref <100)
TRIGLYCERIDES: 326 mg/dL — AB (ref <150)
VLDL: 65 mg/dL — ABNORMAL HIGH (ref <30)

## 2016-10-13 NOTE — Patient Instructions (Signed)
Check your damn blood sugars during the day , before a meal or 2 hours after meal

## 2016-10-13 NOTE — Assessment & Plan Note (Signed)
History of hypertension with blood pressure measured today at 176/88. He is on diltiazem and Lotensin. He will keep a daily blood pressure log for next 30 days and Secrist back in follow-up. He may need a third antihypertensive medication.

## 2016-10-13 NOTE — Progress Notes (Signed)
Subjective:    Patient ID: [name], male    DOB: Jul 28, 1961, 56 y.o.   MRN: [id number]  [name] is a 56 y.o. male who presents for a complete exam and follow-up of Type 2 diabetes mellitus.  Patient is checking home blood sugars.   Home blood sugar records: 75 to 150 How often is blood sugars being checked: BID Current symptoms/problem none Daily foot checks: yes   Any foot concerns: none Last eye exam: 05/15/16 Exercise: 8 to 10,000 steps a day He continues on metformin. He is also taking Cardizem and Lotensin for his blood pressure. He does take Plavix every 3 days and has also been doing this with Crestor. He thinks that both of these medicines might be causing difficulty with muscle and joint aches and pains. And then use on his CPAP and has been getting good readouts. He has underlying psoriatic arthritis and is doing quite well on his present medication regimen. He is being followed by rheumatology for this. He also sees hematology for his polycythemia although the exact diagnosis is unclear. He is also continuing on Lamisil for treatment of onychomycosis and taking this several times per week as well. His reflux is under good control. He has had no chest pain, shortness of breath, symptoms of heart rate irregularity. No weakness, numbness or tingling. The following portions of the patient's history were reviewed and updated as appropriate: allergies, current medications, past medical history, past social history and problem list.  ROS as in subjective above.     Objective:    Physical Exam  BP (!) 150/100   Pulse (!) 40   Ht 5\' 9"  (1.753 m)   Wt 285 lb (129.3 kg)   SpO2 98%   BMI 42.09 kg/m   General Appearance:    Alert, cooperative, no distress, appears stated age  Head:    Normocephalic, without obvious abnormality, atraumatic  Eyes:    PERRL, conjunctiva/corneas clear, EOM's intact, fundi    benign  Ears:    Normal TM's and external ear canals  Nose:    Nares normal, mucosa normal, no drainage or sinus   tenderness  Throat:   Lips, mucosa, and tongue normal; teeth and gums normal  Neck:   Supple, no lymphadenopathy;  thyroid:  no   enlargement/tenderness/nodules; no carotid   bruit or JVD     Lungs:     Clear to auscultation bilaterally without wheezes, rales or     ronchi; respirations unlabored      Heart:    irregular rate and rhythm, S1 and S2 normal, no murmur, rub   or gallop     Abdomen:     Soft, non-tender, nondistended, normoactive bowel sounds,    no masses, no hepatosplenomegaly  Genitalia:    Normal male external genitalia without lesions.  Testicles without masses.  No inguinal hernias.  Rectal:    Normal sphincter tone, no masses or tenderness; guaiac negative stool.  Prostate smooth, no nodules, not enlarged.  Extremities:   No clubbing, cyanosis or edema  Pulses:   2+ and symmetric all extremities  Skin:   Skin color, texture, turgor normal,Psoriatic plaques noticed   Lymph nodes:   Cervical, supraclavicular, and axillary nodes normal  Neurologic:   CNII-XII intact, normal strength, sensation and gait; reflexes 2+ and symmetric throughout          Psych:   Normal mood, affect, hygiene and grooming.  Medical exam normal   EKG shows atrial fibrillation  Lab Review Diabetic Labs Latest Ref Rng & Units 10/13/2016 05/26/2016 01/13/2016 09/12/2015 05/22/2015  HbA1c - 6.5 6.4 6.5 6.1 6.0  Microalbumin mg/L >300.0 - - - -  Micro/Creat Ratio - >391.1 - - - -  Chol 125 - 200 mg/dL - - - 127 -  HDL >=40 mg/dL - - - 41 -  Calc LDL <130 mg/dL - - - 45 -  Triglycerides <150 mg/dL - - - 207(H) -  Creatinine 0.7 - 1.3 mg/dL - - - - 1.1   BP/Weight 10/13/2016 06/03/2016 06/03/2016 05/26/2016 [id number]  Systolic BP Q000111Q 123456 Q000111Q [id number]  Diastolic BP 123XX123 [id number] 82 80  Wt. (Lbs) 285 - 295.8 290 286  BMI 42.09 - 43.68 42.83 42.22   Foot/eye exam completion dates Latest Ref Rng & Units 10/13/2016 05/15/2016  Eye Exam No Retinopathy - No  Retinopathy  Foot Form Completion - Done -  Hemoglobin A1c is 6.5  [name]  reports that he has never smoked. He has never used smokeless tobacco. He reports that he drinks alcohol. He reports that he does not use drugs.     Assessment & Plan:    Routine general medical examination at a health care facility  Hx-TIA (transient ischemic attack) - Plan: EKG 12-Lead  Obstructive sleep apnea  Gastroesophageal reflux disease without esophagitis  Obesity (BMI 30-39.9) - Plan: CBC with Differential/Platelet, Comprehensive metabolic panel, Lipid panel  Psoriatic arthritis (HCC)  Type 2 diabetes mellitus without complication, without long-term current use of insulin (HCC) - Plan: HgB A1c, POCT UA - Microalbumin, POCT Urinalysis Dipstick, CBC with Differential/Platelet, Comprehensive metabolic panel, Lipid panel, EKG 12-Lead  Hyperlipidemia associated with type 2 diabetes mellitus ([place]) - Plan: Lipid panel  Hypertension associated with diabetes ([place]) - Plan: CBC with Differential/Platelet, Comprehensive metabolic panel, EKG [id number]  Polycythemia vera (HCC)  Screening for prostate cancer - Plan: PSA  New onset atrial fibrillation ([place])    1. Rx changes: Recommended he stop taking the Lamisil. 2. Education: Reviewed 'ABCs' of diabetes management (respective goals in parentheses):  A1C (<7), blood pressure (<130/80), and cholesterol (LDL <100). 3. Compliance at present is estimated to be good. Efforts to improve compliance (if necessary) will be directed at increased exercise. Follow up: 4 months for the diabetes. Will see what the lipid numbers are since he is using Crestor only twice per week. Defer medication changes until after he has been seen by Dr. Gwenlyn Found in regard to blood pressure, Plavix. He will continue to see hematology concerning his polycythemia. He'll continue on his reflux medications. He is getting good results with his CPAP and will continue to use that. He has been very  compliant with that.  appointment made today to see Dr. Gwenlyn Found and follow-up on new onset of atrial fibrillation.

## 2016-10-13 NOTE — Assessment & Plan Note (Signed)
History of hyperlipidemia on Crestor in the past. We will recheck a lipid and liver profile

## 2016-10-13 NOTE — Assessment & Plan Note (Signed)
History of obstructive sleep apnea on CPAP which he benefits from 

## 2016-10-13 NOTE — Assessment & Plan Note (Signed)
My Morrisette is a 56 year old mildly overweight Caucasian male who was referred back to see me today by Dr. Redmond School after being found to be in A. fib which she has been in before. I apparently cardioverted him twice 7 years ago. He has never been on oral anticoagulant . This patients CHA2DS2-VASc Score and unadjusted Ischemic Stroke Rate (% per year) is equal to 4.8 % stroke rate/year from a score of 4. I am going to start him on another oral anticoagulant. I'm also going to get a 2-D echocardiogram. He is rate controlled and his otherwise asymptomatic.  Above score calculated as 1 point each if present [CHF, HTN, DM, Vascular=MI/PAD/Aortic Plaque, Age if 65-74, or Male] Above score calculated as 2 points each if present [Age > 75, or Stroke/TIA/TE]

## 2016-10-13 NOTE — Patient Instructions (Signed)
Medication Instructions: Your physician recommends that you continue on your current medications as directed. Please refer to the Current Medication list given to you today.   Labwork: Your physician recommends that you return for a FASTING lipid profile and hepatic function panel.   Testing/Procedures: Your physician has requested that you have an echocardiogram. Echocardiography is a painless test that uses sound waves to create images of your heart. It provides your doctor with information about the size and shape of your heart and how well your heart's chambers and valves are working. This procedure takes approximately one hour. There are no restrictions for this procedure.  Follow-Up: Your physician recommends that you schedule a follow-up appointment in: 1 month with CV[facility] for BP Check and Oral anticoagulation therapy for AFIB.\  Your physician recommends that you schedule a follow-up appointment in: 3 months with Dr. Gwenlyn Found.  Any Other Instructions will be listed below:  Your physician has requested that you regularly monitor and record your blood pressure readings at home. Please use the same machine at the same time of day to check your readings and record them to bring to your follow-up visit.  If you need a refill on your cardiac medications before your next appointment, please call your pharmacy.

## 2016-10-13 NOTE — Telephone Encounter (Signed)
Patient saw Dr. Gwenlyn Found today after EKG showed new onset AF.  Per patient he had a previous episode over 7 years ago, he was cardioverted and has not seen cardiology since.  States he did take warfarin at one time, has no interest in taking again.  He has previously had a TIA, and has hypertension and DM.  CHADS2-VASc score is 4.    Pt was started on Xarelto for Atrial fibrillation on 10/13/2016 Reviewed patients medication list.  Pt is not currently on any combined P-gp and strong CYP3A4 inhibitors/inducers (ketoconazole, traconazole, ritonavir, carbamazepine, phenytoin, rifampin, St. John's wort).  Reviewed labs.  SCr , Weight 130 kg, CrCl- .  Dose appropriate based on CrCl.   Hgb and HCT WNL  A full discussion of the nature of anticoagulants has been carried out.  A benefit/risk analysis has been presented to the patient, so that they understand the justification for choosing anticoagulation with Xarelto at this time.  The need for compliance is stressed.  Pt is aware to take the medication once daily with the largest meal of the day.  Side effects of potential bleeding are discussed, including unusual colored urine or stools, coughing up blood or coffee ground emesis, nose bleeds or serious fall or head trauma.  Discussed signs and symptoms of stroke. The patient should avoid any OTC items containing aspirin or ibuprofen.  Avoid alcohol consumption.   Call if any signs of abnormal bleeding.  Discussed financial obligations and resolved any difficulty in obtaining medication.  Next lab test in 6 months.

## 2016-10-13 NOTE — Progress Notes (Signed)
10/13/2016 [name]   Sep 25, 1960  [id number]  Primary Physician William Haste, MD Primary Cardiologist: William Harp MD [name]  HPI:  [name] is a 56 year old severely overweight very Caucasian male father of 2 children and works as a Clinical biochemist. He was referred by [name] for cardiovascular evaluation because of atrial fibrillation. I apparently took care of him 7 years ago. He did have a clean coronary By Dr. little back 17 years ago. He had a TIA 7 years ago and had A. fib at that time. He was cardioverted twice during the same setting. He was also found to have a PFO and had a PFO occluded by [name]. He also has polycythemia and undergoes occasional phlebotomy. He complains of some shortness of breath but denies chest pain. He has never smoked. Does have type 2 diabetes, hypertension and questionable hyperlipidemia in the past.   Current Outpatient Prescriptions  Medication Sig Dispense Refill  . aspirin 81 MG tablet Take 81 mg by mouth daily.    . benazepril (LOTENSIN) 40 MG tablet Take 1 tablet (40 mg total) by mouth daily. 90 tablet 3  . clopidogrel (PLAVIX) 75 MG tablet Take 1 tablet (75 mg total) by mouth daily. (Patient taking differently: Take 75 mg by mouth daily. Patient takes one tab every other day) 90 tablet 3  . diltiazem (CARDIZEM CD) 240 MG 24 hr capsule Take 1 capsule (240 mg total) by mouth daily. 90 capsule 3  . Ixekizumab (TALTZ) 80 MG/ML SOAJ Inject 80 mg into the skin every 30 (thirty) days. 1 mL 9  . metFORMIN (GLUCOPHAGE-XR) 500 MG 24 hr tablet TAKE 1 TABLET BY MOUTH TWICE DAILY 60 tablet PRN  . pantoprazole (PROTONIX) 40 MG tablet TAKE 1 TABLET BY MOUTH DAILY. 90 tablet 3  . rosuvastatin (CRESTOR) 10 MG tablet TAKE 1 TABLET BY MOUTH ONCE DAILY (Patient taking differently: patient take 1 tab every other day) 90 tablet 0  . Vitamin D, Cholecalciferol, 1000 units CAPS Take 1 capsule by mouth daily.     No  current facility-administered medications for this visit.     No Known Allergies  Social History   Social History  . Marital status: Married    Spouse name: N/A  . Number of children: 2  . Years of education: N/A   Occupational History  . [employer]   Social History Main Topics  . Smoking status: Never Smoker  . Smokeless tobacco: Never Used  . Alcohol use Yes     Comment: 2/month  . Drug use: No  . Sexual activity: Yes   Other Topics Concern  . Not on file   Social History Narrative  . No narrative on file     Review of Systems: General: negative for chills, fever, night sweats or weight changes.  Cardiovascular: negative for chest pain, dyspnea on exertion, edema, orthopnea, palpitations, paroxysmal nocturnal dyspnea or shortness of breath Dermatological: negative for rash Respiratory: negative for cough or wheezing Urologic: negative for hematuria Abdominal: negative for nausea, vomiting, diarrhea, bright red blood per rectum, melena, or hematemesis Neurologic: negative for visual changes, syncope, or dizziness All other systems reviewed and are otherwise negative except as noted above.    Blood pressure (!) 176/88, pulse (!) 55, height 5\' 9"  (1.753 m), weight 287 lb 12.8 oz (130.5 kg), SpO2 95 %.  General appearance: alert and no distress Neck: no adenopathy, no carotid bruit, no JVD, supple,  symmetrical, trachea midline and thyroid not enlarged, symmetric, no tenderness/mass/nodules Lungs: clear to auscultation bilaterally Heart: irregularly irregular rhythm Extremities: extremities normal, atraumatic, no cyanosis or edema  EKG atrial fibrillation with ventricular response of 69 septal Q waves. I personally reviewed reviewed this EKG.  ASSESSMENT AND PLAN:   Permanent atrial fibrillation (HCC) [name] is a 56 year old mildly overweight Caucasian male who was referred back to see me today by [name] after being found to be in  A. fib which she has been in before. I apparently cardioverted him twice 7 years ago. He has never been on oral anticoagulant . This patients CHA2DS2-VASc Score and unadjusted Ischemic Stroke Rate (% per year) is equal to 4.8 % stroke rate/year from a score of 4. I am going to start him on another oral anticoagulant. I'm also going to get a 2-D echocardiogram. He is rate controlled and his otherwise asymptomatic.  Above score calculated as 1 point each if present [CHF, HTN, DM, Vascular=MI/PAD/Aortic Plaque, Age if 65-74, or Male] Above score calculated as 2 points each if present [Age > 75, or Stroke/TIA/TE]   Hyperlipidemia associated with type 2 diabetes mellitus ([name]) History of hyperlipidemia on Crestor in the past. We will recheck a lipid and liver profile  Hypertension associated with diabetes ([name]) History of hypertension with blood pressure measured today at 176/88. He is on diltiazem and Lotensin. He will keep a daily blood pressure log for next 30 days and [name] back in follow-up. He may need a third antihypertensive medication.  Obstructive sleep apnea History of obstructive sleep apnea on CPAP which he benefits from      William Harp MD [facility], [facility] 10/13/2016 2:23 PM

## 2016-10-14 LAB — PSA: PSA: 0.3 ng/mL (ref <=4.0)

## 2016-10-15 ENCOUNTER — Telehealth: Payer: Self-pay | Admitting: Cardiovascular Disease

## 2016-10-15 ENCOUNTER — Telehealth: Payer: Self-pay | Admitting: Family Medicine

## 2016-10-15 MED ORDER — RIVAROXABAN 20 MG PO TABS: 20.0000 mg | ORAL_TABLET | Freq: Every day | ORAL | 3 refills | Status: DC

## 2016-10-15 MED FILL — XARELTO 20 MG TABLET: 20 | 90 days supply | Qty: 90 | Fill #0

## 2016-10-15 NOTE — Telephone Encounter (Signed)
Left message  On secured voiced mail E sent medication  [facility]  90 day supply with quantity 90 tablets and 3 refills  - message that there is a saving card available if interested - call back - will need to activate

## 2016-10-15 NOTE — Telephone Encounter (Signed)
Completion of previous note:  Labs reviewed:    SCr  =  1.22  Wt    =  130.5 kg  CrCl =   122 ml/min   Hg =  18.2  Hct =  51.8  Xarelto dose ok at 20 mg daily with meal

## 2016-10-15 NOTE — Telephone Encounter (Signed)
I have reviewed pt chart and MY Chart notes and cannot find anything regarding Podiatrist request.  I have called pt and left message.  He called back left message that Dr. Redmond School said feet are fine.  Called pt again no answer need to know who wants the referral and if feet are fine then why needed.

## 2016-10-15 NOTE — Telephone Encounter (Signed)
Pt said he saw Dr Gwenlyn Found on Tuesday and his Xarelto was supposed to have been called in,it is not there. Would you please call this into today to [facility] at [facility].

## 2016-10-19 ENCOUNTER — Other Ambulatory Visit: Payer: Self-pay

## 2016-10-19 MED ORDER — GLUCOSE BLOOD VI STRP: ORAL_STRIP | 12 refills | Status: DC

## 2016-10-19 MED ORDER — ACCU-CHEK FASTCLIX LANCETS MISC: 4 refills | Status: DC

## 2016-10-19 MED FILL — ACCU-CHEK FASTCLIX LANCETS: 51 days supply | Qty: 102 | Fill #0

## 2016-10-19 MED FILL — ACCU-CHEK GUIDE TEST STRIP: 50 days supply | Qty: 100 | Fill #0

## 2016-10-23 ENCOUNTER — Ambulatory Visit (HOSPITAL_COMMUNITY): Payer: 59 | Attending: Cardiology

## 2016-10-23 ENCOUNTER — Other Ambulatory Visit: Payer: Self-pay

## 2016-10-23 DIAGNOSIS — I517 Cardiomegaly: Secondary | ICD-10-CM | POA: Insufficient documentation

## 2016-10-23 DIAGNOSIS — Q211 Atrial septal defect: Secondary | ICD-10-CM | POA: Diagnosis not present

## 2016-10-23 DIAGNOSIS — I4891 Unspecified atrial fibrillation: Secondary | ICD-10-CM | POA: Diagnosis not present

## 2016-10-23 MED ORDER — PERFLUTREN LIPID MICROSPHERE
1.0000 mL | INTRAVENOUS | Status: AC | PRN
  Administered 2016-10-23: 1 mL via INTRAVENOUS

## 2016-11-02 ENCOUNTER — Telehealth: Payer: Self-pay

## 2016-11-02 MED ORDER — METFORMIN HCL ER 500 MG PO TB24: 500.0000 mg | ORAL_TABLET | Freq: Two times a day (BID) | ORAL | 5 refills | Status: DC

## 2016-11-02 MED FILL — TALTZ 80 MG/ML AUTOINJECTOR: 80 | 30 days supply | Qty: 1 | Fill #9

## 2016-11-02 MED FILL — METFORMIN HCL ER 500 MG TAB: 500 | 90 days supply | Qty: 180 | Fill #0

## 2016-11-02 NOTE — Telephone Encounter (Signed)
Pt needs refill of Metformin to [place] [facility] pharmacy. Victorino December

## 2016-11-02 NOTE — Telephone Encounter (Signed)
done

## 2016-11-03 DIAGNOSIS — L400 Psoriasis vulgaris: Secondary | ICD-10-CM | POA: Diagnosis not present

## 2016-11-03 DIAGNOSIS — Z79899 Other long term (current) drug therapy: Secondary | ICD-10-CM | POA: Diagnosis not present

## 2016-11-10 ENCOUNTER — Ambulatory Visit: Payer: 59

## 2016-11-12 ENCOUNTER — Ambulatory Visit (INDEPENDENT_AMBULATORY_CARE_PROVIDER_SITE_OTHER): Payer: 59 | Admitting: Pharmacist Clinician (PhC)/ Clinical Pharmacy Specialist

## 2016-11-12 DIAGNOSIS — I10 Essential (primary) hypertension: Secondary | ICD-10-CM | POA: Diagnosis not present

## 2016-11-12 DIAGNOSIS — I152 Hypertension secondary to endocrine disorders: Secondary | ICD-10-CM

## 2016-11-12 DIAGNOSIS — E1159 Type 2 diabetes mellitus with other circulatory complications: Secondary | ICD-10-CM

## 2016-11-12 NOTE — Assessment & Plan Note (Signed)
Patient with atrial fibrillation and hypertension.  While home readings continue to be elevated, his reading in the office today is good at 122/84.  I will not make any changes today, bud did encourage him to work on weight loss.  Will not make any medication changes today, but advised that he continue with daily monitoring.  He is due to see Dr. Gwenlyn Found in May, but feels that the fatigue is not something he wants to live with and would like to see Dr. Gwenlyn Found sooner if possible.  Also would like results of his ECHO.  Will forward to Dr. Kennon Holter nurse Lovena Le) to see if the appointment can be moved.

## 2016-11-12 NOTE — Patient Instructions (Signed)
Return for a a follow up appointment with Dr. Gwenlyn Found (will try to move to sooner date)  Your blood pressure today is 122/84  (goal is < 130/80)  Check your blood pressure at home daily and keep record of the readings.  Take your BP meds as follows:  Continue with metoprolol and benazepril  Bring all of your meds, your BP cuff and your record of home blood pressures to your next appointment.  Exercise as you're able, try to walk approximately 30 minutes per day.  Keep salt intake to a minimum, especially watch canned and prepared boxed foods.  Eat more fresh fruits and vegetables and fewer canned items.  Avoid eating in fast food restaurants.    HOW TO TAKE YOUR BLOOD PRESSURE: . Rest 5 minutes before taking your blood pressure. .  Don't smoke or drink caffeinated beverages for at least 30 minutes before. . Take your blood pressure before (not after) you eat. . Sit comfortably with your back supported and both feet on the floor (don't cross your legs). . Elevate your arm to heart level on a table or a desk. . Use the proper sized cuff. It should fit smoothly and snugly around your bare upper arm. There should be enough room to slip a fingertip under the cuff. The bottom edge of the cuff should be 1 inch above the crease of the elbow. . Ideally, take 3 measurements at one sitting and record the average.

## 2016-11-12 NOTE — Progress Notes (Signed)
11/12/2016 [name] 1961-07-20 [id number]   HPI:  [name] is a 56 y.o. male patient of Dr Gwenlyn Found, with a PMH below who presents today for hypertension clinic evaluation.  His cardiac history is significant for atrial fibrillation, with remote TIA (about 7 years ago), hyperlipidemia, OSA and diabetes.  He also has polycythemia and for this undergoes occasional phlebotomy.    Today he complains of feeling some lethargy that is starting to bother his lifestyle.  He admits to occasional afternoon naps, but states this does not cut out on his nighttime sleep habits.  He uses his CPAP nightly, and keeps track of his use on a cell phone app.  Most nights show his "grade" of 90+%.  He also keeps track of his blood sugars and steps on his phone.    He notes a cough today, states it started about 3-4 weeks ago.  Patient was wondering if possible side effect of Xarelto.  Explained that this is not a common concern, would suspect benazepril more.  (He has been on the same 40 mg does for several years).  He notes that his wife has also been coughing for the past month, and they believe it to be viral.  I suggested that if it does not go away in the next few weeks, we should consider switching the benazepril   Blood Pressure Goal:  130/80  Current Medications:  Benazepril 40 mg qd  Diltiazem 240 mg qd  Family Hx:  Mother died at 25 from cancer, father still living at 68 with an arrhythmia; son also has similar arrhythmia  Social Hx:  No tobacco; occasional alcohol; does drink up to a gallon of tea per day (1/2 decaf and sweetened with Splenda and Sweet and Low  Diet:  Eats out some, but does try to avoid fast food (other than morning biscuits a few times per week); Does admit to using salt regularly, states he can't taste much and salt helps.    Exercise:  Step goal is 8900 per day, reaches that goal about 1/2 the time; does occasionally walk his dogs at home after work  Home BP  readings:  30 readings over past month show home average of 148/95.  Range is 122-170/77-105.  Brought his home cuff in today and it read within 5 points of office cuff.  Of note he does frequently get errors on his home machine, suspect that could be difficulty reading due to atrial fibrillation.  Intolerances:   No known  Wt Readings from Last 3 Encounters:  10/13/16 287 lb 12.8 oz (130.5 kg)  10/13/16 285 lb (129.3 kg)  06/03/16 295 lb 12.8 oz (134.2 kg)   BP Readings from Last 3 Encounters:  11/12/16 122/84  10/13/16 (!) 176/88  10/13/16 (!) 150/100   Pulse Readings from Last 3 Encounters:  11/12/16 72  10/13/16 (!) 55  10/13/16 (!) 40    Current Outpatient Prescriptions  Medication Sig Dispense Refill  . ACCU-CHEK FASTCLIX LANCETS [place] Patient is to check BID DX: E11.9 102 each 4  . aspirin 81 MG tablet Take 81 mg by mouth daily.    . benazepril (LOTENSIN) 40 MG tablet Take 1 tablet (40 mg total) by mouth daily. 90 tablet 3  . clopidogrel (PLAVIX) 75 MG tablet Take 1 tablet (75 mg total) by mouth daily. (Patient taking differently: Take 75 mg by mouth daily. Patient takes one tab every other day) 90 tablet 3  . diltiazem (CARDIZEM CD)  240 MG 24 hr capsule Take 1 capsule (240 mg total) by mouth daily. 90 capsule 3  . glucose blood test strip This is for Accu-chek fastclick  Patient is to test BID DX E11.9 100 each 12  . Ixekizumab (TALTZ) 80 MG/ML SOAJ Inject 80 mg into the skin every 30 (thirty) days. 1 mL 9  . metFORMIN (GLUCOPHAGE-XR) 500 MG 24 hr tablet Take 1 tablet (500 mg total) by mouth 2 (two) times daily. 60 tablet 5  . pantoprazole (PROTONIX) 40 MG tablet TAKE 1 TABLET BY MOUTH DAILY. 90 tablet 3  . rivaroxaban (XARELTO) 20 MG TABS tablet Take 1 tablet (20 mg total) by mouth daily with supper. 90 tablet 3  . rosuvastatin (CRESTOR) 10 MG tablet TAKE 1 TABLET BY MOUTH ONCE DAILY (Patient taking differently: patient take 1 tab every other day) 90 tablet 0  . Vitamin D,  Cholecalciferol, 1000 units CAPS Take 1 capsule by mouth daily.     No current facility-administered medications for this visit.     No Known Allergies  Past Medical History:  Diagnosis Date  . Arthritis   . Diverticulosis   . Dyslipidemia   . GERD (gastroesophageal reflux disease)   . Herpes labialis   . Hypertension   . Metabolic syndrome   . Obesity   . Proteinuria   . Psoriasis   . Seborrheic dermatitis   . Sleep apnea   . TIA (transient ischemic attack) 2009    Blood pressure 122/84, pulse 72.  Hypertension associated with diabetes [facility]) Patient with atrial fibrillation and hypertension.  While home readings continue to be elevated, his reading in the office today is good at 122/84.  I will not make any changes today, bud did encourage him to work on weight loss.  Will not make any medication changes today, but advised that he continue with daily monitoring.  He is due to see Dr. Gwenlyn Found in May, but feels that the fatigue is not something he wants to live with and would like to see Dr. Gwenlyn Found sooner if possible.  Also would like results of his ECHO.  Will forward to Dr. Kennon Holter nurse Lovena Le) to see if the appointment can be moved.     Tommy Medal PharmD CPP [facility]

## 2016-11-20 ENCOUNTER — Ambulatory Visit (INDEPENDENT_AMBULATORY_CARE_PROVIDER_SITE_OTHER): Payer: Self-pay | Admitting: Physician Assistant

## 2016-11-20 ENCOUNTER — Encounter: Payer: Self-pay | Admitting: Physician Assistant

## 2016-11-20 VITALS — BP 138/90 | HR 82 | Temp 98.3°F | Resp 17 | Ht 69.5 in | Wt 291.0 lb

## 2016-11-20 DIAGNOSIS — Z024 Encounter for examination for driving license: Secondary | ICD-10-CM

## 2016-11-20 NOTE — Patient Instructions (Signed)
     IF you received an x-ray today, you will receive an invoice from [facility]. Please contact [facility] at [phone] with questions or concerns regarding your invoice.   IF you received labwork today, you will receive an invoice from [facility]. Please contact [facility] at [phone] with questions or concerns regarding your invoice.   Our billing staff will not be able to assist you with questions regarding bills from these companies.  You will be contacted with the lab results as soon as they are available. The fastest way to get your results is to activate your My Chart account. Instructions are located on the last page of this paperwork. If you have not heard from us regarding the results in 2 weeks, please contact this office.     

## 2016-11-20 NOTE — Progress Notes (Signed)
This patient presents for DOT examination for fitness for duty.   Medical History:  1. Head/Brain Injuries, disorders or illnesses   no  2. Seizures, epilepsy no  3. Eye disorders or impaired vision (except corrective lenses) no  4. Ear disorders, loss of hearing or balance no  5. Heart disease or heart attack, other cardiovascular condition yes  6. Heart surgery (valve replacement/bypass, angioplasty, pacemaker/defribrillator) yes  7. High blood pressure yes  8. High cholesterol yes  9. Chronic cough, shortness of breath or other breathing problems no  10. Lung disease (emphysema, asthma or chronic bronchitis) no  11. Kidney disease, dialysis no  12. Digestive problems  no  13. Diabetes or elevated blood sugar no                      If yes to #13, Insulin use no  14. Nervious or psychiatric disorders, e.g., severe depression no  15. Fainting or syncope no  16. Dizziness, headaches, numbness, tingling or memory loss no  17. Unexplained weight loss no  18. Stroke, TIA or paralysis yes  19. Missing or impaired hand, arm, foot, leg, finger, toe no  20. Spinal injury or disease no  21. Bone, muscles or nerve problems no  22. Blood clots or bleeding bleeding disorders no  23. Cancer no  24. Chronic infection or other chronic diseases no  25. Sleep disorders, pauses in breathing while asleep, daytime sleepiness, loud snoring yes  26. Have you ever had a sleep test? yes  27.  Have you ever spent a night in the hospital? yes  28. Have you ever had a broken bone? no  29. Have you or or do you use tobacco products? no  30. Regular, frequent alcohol use no  31. Illegal substance use within the past 2 years no  32.  Have you ever failed a drug test or been dependent on an illegal substance? no   Current Medications: Prior to Admission medications   Medication Sig Start Date End Date Taking? Authorizing Provider  ACCU-CHEK FASTCLIX LANCETS [place] Patient is to check BID DX: E11.9  10/19/16  Yes Denita Lung, MD  aspirin 81 MG tablet Take 81 mg by mouth daily.   Yes Historical Provider, MD  benazepril (LOTENSIN) 40 MG tablet Take 1 tablet (40 mg total) by mouth daily. 06/18/16  Yes Denita Lung, MD  diltiazem (CARDIZEM CD) 240 MG 24 hr capsule Take 1 capsule (240 mg total) by mouth daily. 06/18/16  Yes Denita Lung, MD  glucose blood test strip This is for Accu-chek fastclick  Patient is to test BID DX E11.9 10/19/16  Yes Denita Lung, MD  Ixekizumab (TALTZ) 80 MG/ML SOAJ Inject 80 mg into the skin every 30 (thirty) days. 02/07/16  Yes Tresa Garter, MD  metFORMIN (GLUCOPHAGE-XR) 500 MG 24 hr tablet Take 1 tablet (500 mg total) by mouth 2 (two) times daily. 11/02/16  Yes Vickie L Henson, NP-C  rivaroxaban (XARELTO) 20 MG TABS tablet Take 1 tablet (20 mg total) by mouth daily with supper. 10/15/16  Yes Lorretta Harp, MD  rosuvastatin (CRESTOR) 10 MG tablet TAKE 1 TABLET BY MOUTH ONCE DAILY 07/27/16  Yes Denita Lung, MD  Vitamin D, Cholecalciferol, 1000 units CAPS Take 1 capsule by mouth daily.   Yes Historical Provider, MD  clopidogrel (PLAVIX) 75 MG tablet Take 1 tablet (75 mg total) by mouth daily. Patient not taking: Reported on 11/20/2016  06/18/16   Denita Lung, MD    Medical Examiner's Comments on Health History:  LEFT THR Type 2 diabetes. A1C 6.5% in 10/2016. TIA 2006, due to patent foramen ovale. Occluder placed by Dr. Christen Butter.  Developed Afib, thought due to the occluder, cardioverted successfully. A fib recurred 10/2016, when it recurred and he was sent back to cardiology (Dr. Gwenlyn Found) and changed from Plavix to Xarelto.  Home BP 150's/70-80's.  Participates in [employer] OSA diagnosed 12 years ago. Reports compliance with CPAP, even when traveling. Home OSA app on his phone reveals compliance x 30 days. Compliance report from [place] reveals 97% compliance x 30 days.   TESTING:   Visual Acuity Screening   Right eye Left eye Both eyes    Without correction: 20/40 20/30 20/30   With correction:     Hearing Screening Comments: Peripheral Vision: Right eye 85 degrees. Left eye 85 degrees. The patient was able to hear a forced whisper from L=10 R=10 feet. The patient can distinguish the colors red, amber and green.  Monocular Vision: No.  Hearing Aid used for test: No. Hearing Aid required to to meet standard: No.  BP (!) 161/98   Pulse 82   Temp 98.3 F (36.8 C) (Oral)   Resp 17   Ht 5' 9.5" (1.765 m)   Wt 291 lb (132 kg)   SpO2 97%   BMI 42.36 kg/m  Pulse rate is irregular  Comments: spgr:1.015, pro:neg Glu:neg Blood:neg   PHYSICAL EXAMINATION:  General Appearance Not markedly obese. No tremor, signs of alcoholism, problem drinking or drug abuse.   Skin Warm, dry and intact. Scars (LEFT forearm, LEFT hip), Tattoos (none)  Eyes Pupils are equal, round and reactive to light and accommodation, extraocular movements are intact. No exophthalmos, no nystagmus. Evidence of cataracts, retinopathy, macular degeneration, aphakia, glaucoma may need referral to a specialist.  Ears Normal external ears. External canal without occlusion. No scarring of the TM. No perforation of the TM.  Mouth and Throat Clear and moist. No irremedial deformities likely to interfere with breathing or swallowing.  Heart Irregularly irregular. No murmurs, extra sounds, evidence of cardiomegaly. No pacemaker. No implantable defibrillator.  Lungs and Chest (excluding breasts) Normal chest expansion, respiratory rate, breath sounds. No cyanosis.  Abdomen and Viscera No liver enlargement. No splenic enlargement. No masses, bruits, hernias or significant abdominal wall weakness.  Genitourinary  No inguinal or femoral hernia.  Spine and other musculoskeletal No tenderness, no limitation of motion, no deformities. Evidence of previous surgery: LEFT forearm, well healed laceration.  Extremities No loss or impairment of leg, foot, toe, arm, hand, finger.  No perceptible limp, deformities, atrophy, weakness, paralysis, clubbing, edema, hypotonia. Patient has sufficient grasp and prehension to maintain steering wheel grip. Patient has sufficient mobility and strength in the lower limbs to operate pedals properly.  Neurologic Normal equilibrium, coordination, speech pattern. No paresthesia, asymmetry of deep tendon reflexes, sensory or positional abnormalities. No abnormality of patellar or Babinski's reflexes.  Gait Not antalgic or ataxic  Vascular Normal pulses. No carotid or arterial bruits. No varicose veins.    Temporarily disqualified due to : 1. BP not <140/90, 2. Needs clearance from neurology due to TIA.  Wearing corrective lenses: no Wearing hearing aid: no Accompanied by a N/A waiver/exemption Skill performance Evaluation (SPE) Certificate: no Driving within an exempt intracity zone: no Qualified by operation of 49 CFR 391.64: no  I will ask Dr. Redmond School and Dr. Leonie Man if a clearance letter already exists, and  if so, he can be cleared for 1 year from today. If not, he will need to see neurology for clearance, and then card can be issued for 1 year, also from today's exam.

## 2016-11-23 DIAGNOSIS — G4733 Obstructive sleep apnea (adult) (pediatric): Secondary | ICD-10-CM | POA: Diagnosis not present

## 2016-11-23 NOTE — Progress Notes (Signed)
Looks like we have not seen him he needs to make an appointment to be seen or referred

## 2016-11-24 ENCOUNTER — Encounter: Payer: Self-pay | Admitting: Cardiovascular Disease

## 2016-11-24 ENCOUNTER — Ambulatory Visit
Admission: RE | Admit: 2016-11-24 | Discharge: 2016-11-24 | Disposition: A | Discharge status: Home / Self Care | Payer: 59 | Source: Ambulatory Visit | Attending: Cardiovascular Disease | Admitting: Cardiovascular Disease

## 2016-11-24 ENCOUNTER — Ambulatory Visit (INDEPENDENT_AMBULATORY_CARE_PROVIDER_SITE_OTHER): Payer: 59 | Admitting: Cardiovascular Disease

## 2016-11-24 VITALS — BP 159/90 | HR 65 | Ht 70.0 in | Wt 289.0 lb

## 2016-11-24 DIAGNOSIS — I482 Chronic atrial fibrillation: Secondary | ICD-10-CM

## 2016-11-24 DIAGNOSIS — I4891 Unspecified atrial fibrillation: Secondary | ICD-10-CM | POA: Diagnosis not present

## 2016-11-24 DIAGNOSIS — I4821 Permanent atrial fibrillation: Secondary | ICD-10-CM

## 2016-11-24 LAB — BASIC METABOLIC PANEL WITH GFR
BUN: 21 mg/dL (ref 7–25)
CO2: 24 mmol/L (ref 20–31)
Calcium: 9.8 mg/dL (ref 8.6–10.3)
Chloride: 100 mmol/L (ref 98–110)
Creat: 1.21 mg/dL (ref 0.70–1.33)
GFR, EST AFRICAN AMERICAN: 76 mL/min (ref >=60)
GFR, EST NON AFRICAN AMERICAN: 66 mL/min (ref >=60)
GLUCOSE: 164 mg/dL — AB (ref 65–99)
POTASSIUM: 4.4 mmol/L (ref 3.5–5.3)
Sodium: 134 mmol/L — ABNORMAL LOW (ref 135–146)

## 2016-11-24 LAB — CBC WITH DIFFERENTIAL/PLATELET
BASOS ABS: 0 {cells}/uL (ref 0–200)
BASOS PCT: 0 %
EOS PCT: 2 %
Eosinophils Absolute: 108 cells/uL (ref 15–500)
HCT: 52.3 % — ABNORMAL HIGH (ref 38.5–50.0)
Hemoglobin: 17.9 g/dL — ABNORMAL HIGH (ref 13.2–17.1)
LYMPHS ABS: 1296 {cells}/uL (ref 850–3900)
Lymphocytes Relative: 24 %
MCH: 30.3 pg (ref 27.0–33.0)
MCHC: 34.2 g/dL (ref 32.0–36.0)
MCV: 88.5 fL (ref 80.0–100.0)
MPV: 9.5 fL (ref 7.5–12.5)
Monocytes Absolute: 540 cells/uL (ref 200–950)
Monocytes Relative: 10 %
NEUTROS ABS: 3456 {cells}/uL (ref 1500–7800)
Neutrophils Relative %: 64 %
Platelets: 169 10*3/uL (ref 140–400)
RBC: 5.91 MIL/uL — AB (ref 4.20–5.80)
RDW: 15.1 % — ABNORMAL HIGH (ref 11.0–15.0)
WBC: 5.4 10*3/uL (ref 3.8–10.8)

## 2016-11-24 LAB — TSH: TSH: 1.1 mIU/L (ref 0.40–4.50)

## 2016-11-24 NOTE — Progress Notes (Signed)
11/24/2016 [name]   [birth year]  [id number]  Primary Physician Wyatt Haste, MD Primary Cardiologist: Lorretta Harp MD Renae Gloss  HPI:  [name] is a 56 year old severely overweight very Caucasian male father of 2 children and works as a Clinical biochemist. He was referred by Dr. Redmond School for cardiovascular evaluation because of atrial fibrillation. I apparently took care of him 7 years ago. I recently saw him in the office 10/13/16. He did have a clean coronary By Dr. little back 17 years ago. He had a TIA 7 years ago and had A. fib at that time. He was cardioverted twice during the same setting. He was also found to have a PFO and had a PFO occluded by Dr. Nadyne Coombes. He also has polycythemia and undergoes occasional phlebotomy. He complains of some shortness of breath but denies chest pain. He has never smoked. Does have type 2 diabetes, hypertension and questionable hyperlipidemia in the past. I began him on oral anticoagulation at his last office visit for stroke prevention. A 2-D echocardiogram performed 2/23/6 team revealed moderate LV dysfunction with an EF of 40-45% and moderate global hypokinesia with mild concentric LVH and moderate left atrial enlargement.    Current Outpatient Prescriptions  Medication Sig Dispense Refill  . ACCU-CHEK FASTCLIX LANCETS [place] Patient is to check BID DX: E11.9 102 each 4  . aspirin 81 MG tablet Take 81 mg by mouth daily.    . benazepril (LOTENSIN) 40 MG tablet Take 1 tablet (40 mg total) by mouth daily. 90 tablet 3  . diltiazem (CARDIZEM CD) 240 MG 24 hr capsule Take 1 capsule (240 mg total) by mouth daily. 90 capsule 3  . glucose blood test strip This is for Accu-chek fastclick  Patient is to test BID DX E11.9 100 each 12  . Ixekizumab (TALTZ) 80 MG/ML SOAJ Inject 80 mg into the skin every 30 (thirty) days. 1 mL 9  . metFORMIN (GLUCOPHAGE-XR) 500 MG 24 hr tablet Take 1 tablet (500 mg total) by mouth 2 (two) times daily.  60 tablet 5  . rivaroxaban (XARELTO) 20 MG TABS tablet Take 1 tablet (20 mg total) by mouth daily with supper. 90 tablet 3  . rosuvastatin (CRESTOR) 10 MG tablet TAKE 1 TABLET BY MOUTH ONCE DAILY 90 tablet 0  . Vitamin D, Cholecalciferol, 1000 units CAPS Take 1 capsule by mouth daily.     No current facility-administered medications for this visit.     No Known Allergies  Social History   Social History  . Marital status: Married    Spouse name: N/A  . Number of children: 2  . Years of education: N/A   Occupational History  . [employer]   Social History Main Topics  . Smoking status: Never Smoker  . Smokeless tobacco: Never Used  . Alcohol use Yes     Comment: 2/month  . Drug use: No  . Sexual activity: Yes   Other Topics Concern  . Not on file   Social History Narrative  . No narrative on file     Review of Systems: General: negative for chills, fever, night sweats or weight changes.  Cardiovascular: negative for chest pain, dyspnea on exertion, edema, orthopnea, palpitations, paroxysmal nocturnal dyspnea or shortness of breath Dermatological: negative for rash Respiratory: negative for cough or wheezing Urologic: negative for hematuria Abdominal: negative for nausea, vomiting, diarrhea, bright red blood per rectum, melena, or hematemesis Neurologic: negative for visual changes, syncope,  or dizziness All other systems reviewed and are otherwise negative except as noted above.    Blood pressure (!) 159/90, pulse 65, height 5\' 10"  (1.778 m), weight 289 lb (131.1 kg), SpO2 96 %.  General appearance: alert and no distress Neck: no adenopathy, no carotid bruit, no JVD, supple, symmetrical, trachea midline and thyroid not enlarged, symmetric, no tenderness/mass/nodules Lungs: clear to auscultation bilaterally Heart: irregularly irregular rhythm Extremities: extremities normal, atraumatic, no cyanosis or edema  EKG not  performed  today  ASSESSMENT AND PLAN:   Permanent atrial fibrillation (HCC) History of atrial fibrillation status post DC cardioversion in 2007 after his PFO was percutaneously occluded by Dr. Einar Gip. He has been on Xarelto which I started approximately 6 weeks ago. A 2-D echo performed 10/23/16 revealed an ejection fraction of 40-45% with diffuse hypokinesia and moderate left atrial dilatation. His PFO occluder was intact and there is no intra-atrial shunt. He wishes to pursue outpatient cardioversion. She does get a Games developer physical yearly and apparently his atrial fibrillation is less than a year-old. It may be that his LV dysfunction is related to this since his last EF was 60% during his TEE. We will arrange outpatient cardioversion.      Lorretta Harp MD FACP,FACC,FAHA, [facility] 11/24/2016 9:17 AM

## 2016-11-24 NOTE — Patient Instructions (Signed)
Medication Instructions: Your physician recommends that you continue on your current medications as directed. Please refer to the Current Medication list given to you today.   Labwork: Your physician recommends that you return for lab work: TSH, BMET, CBC, PT-INR, PTT   Testing/Procedures: A chest x-ray takes a picture of the organs and structures inside the chest, including the heart, lungs, and blood vessels. This test can show several things, including, whether the heart is enlarges; whether fluid is building up in the lungs; and whether pacemaker / defibrillator leads are still in place.  Your physician has recommended that you have a Cardioversion (DCCV) in 1-2 weeks. Electrical Cardioversion uses a jolt of electricity to your heart either through paddles or wired patches attached to your chest. This is a controlled, usually prescheduled, procedure. Defibrillation is done under light anesthesia in the hospital, and you usually go home the day of the procedure. This is done to get your heart back into a normal rhythm. You are not awake for the procedure. Please see the instruction sheet given to you today.   Follow-Up: Your physician recommends that you schedule a follow-up appointment in: 6 weeks with Dr. Gwenlyn Found.  If you need a refill on your cardiac medications before your next appointment, please call your pharmacy.

## 2016-11-24 NOTE — Assessment & Plan Note (Signed)
History of atrial fibrillation status post DC cardioversion in 2007 after his PFO was percutaneously occluded by Dr. Einar Gip. He has been on Xarelto which I started approximately 6 weeks ago. A 2-D echo performed 10/23/16 revealed an ejection fraction of 40-45% with diffuse hypokinesia and moderate left atrial dilatation. His PFO occluder was intact and there is no intra-atrial shunt. He wishes to pursue outpatient cardioversion. She does get a Games developer physical yearly and apparently his atrial fibrillation is less than a year-old. It may be that his LV dysfunction is related to this since his last EF was 60% during his TEE. We will arrange outpatient cardioversion.

## 2016-11-25 ENCOUNTER — Other Ambulatory Visit: Payer: Self-pay | Admitting: Cardiovascular Disease

## 2016-11-25 LAB — PROTIME-INR
INR: 1.1
Prothrombin Time: 11.9 s — ABNORMAL HIGH (ref 9.0–11.5)

## 2016-11-25 LAB — APTT: APTT: 28 s (ref 22–34)

## 2016-12-02 ENCOUNTER — Encounter (HOSPITAL_COMMUNITY)
Admission: RE | Disposition: A | Discharge status: Home / Self Care | Payer: Self-pay | Source: Ambulatory Visit | Attending: Internal Medicine

## 2016-12-02 ENCOUNTER — Ambulatory Visit (HOSPITAL_COMMUNITY): Payer: 59 | Admitting: Certified Registered Nurse Anesthetist

## 2016-12-02 ENCOUNTER — Ambulatory Visit (HOSPITAL_COMMUNITY)
Admission: RE | Admit: 2016-12-02 | Discharge: 2016-12-02 | Disposition: A | Discharge status: Home / Self Care | Payer: 59 | Source: Ambulatory Visit | Attending: Internal Medicine | Admitting: Internal Medicine

## 2016-12-02 ENCOUNTER — Encounter (HOSPITAL_COMMUNITY): Payer: Self-pay | Admitting: Certified Registered Nurse Anesthetist

## 2016-12-02 DIAGNOSIS — E119 Type 2 diabetes mellitus without complications: Secondary | ICD-10-CM | POA: Insufficient documentation

## 2016-12-02 DIAGNOSIS — I4891 Unspecified atrial fibrillation: Secondary | ICD-10-CM | POA: Insufficient documentation

## 2016-12-02 DIAGNOSIS — M1990 Unspecified osteoarthritis, unspecified site: Secondary | ICD-10-CM | POA: Insufficient documentation

## 2016-12-02 DIAGNOSIS — G4730 Sleep apnea, unspecified: Secondary | ICD-10-CM | POA: Diagnosis not present

## 2016-12-02 DIAGNOSIS — I10 Essential (primary) hypertension: Secondary | ICD-10-CM | POA: Insufficient documentation

## 2016-12-02 DIAGNOSIS — E1122 Type 2 diabetes mellitus with diabetic chronic kidney disease: Secondary | ICD-10-CM | POA: Diagnosis not present

## 2016-12-02 DIAGNOSIS — K219 Gastro-esophageal reflux disease without esophagitis: Secondary | ICD-10-CM | POA: Insufficient documentation

## 2016-12-02 DIAGNOSIS — G4733 Obstructive sleep apnea (adult) (pediatric): Secondary | ICD-10-CM | POA: Diagnosis not present

## 2016-12-02 DIAGNOSIS — I481 Persistent atrial fibrillation: Secondary | ICD-10-CM | POA: Diagnosis not present

## 2016-12-02 HISTORY — PX: CARDIOVERSION: SHX1299

## 2016-12-02 LAB — GLUCOSE, CAPILLARY: Glucose-Capillary: 104 mg/dL — ABNORMAL HIGH (ref 65–99)

## 2016-12-02 SURGERY — CARDIOVERSION
Anesthesia: General

## 2016-12-02 MED ORDER — PROPOFOL 10 MG/ML IV BOLUS
INTRAVENOUS | Status: DC | PRN
  Administered 2016-12-02: 100 mg via INTRAVENOUS
  Administered 2016-12-02: 20 mg via INTRAVENOUS

## 2016-12-02 MED ORDER — SODIUM CHLORIDE 0.9 % IV SOLN
INTRAVENOUS | Status: DC | PRN
  Administered 2016-12-02: 14:00:00 via INTRAVENOUS

## 2016-12-02 MED ORDER — LIDOCAINE 2% (20 MG/ML) 5 ML SYRINGE
INTRAMUSCULAR | Status: DC | PRN
  Administered 2016-12-02: 100 mg via INTRAVENOUS

## 2016-12-02 NOTE — CV Procedure (Signed)
    CARDIOVERSION NOTE  Procedure: Electrical Cardioversion Indications:  Atrial Fibrillation  Procedure Details:  Consent: Risks of procedure as well as the alternatives and risks of each were explained to the (patient/caregiver).  Consent for procedure obtained.  Time Out: Verified patient identification, verified procedure, site/side was marked, verified correct patient position, special equipment/implants available, medications/allergies/relevent history reviewed, required imaging and test results available.  Performed  Patient placed on cardiac monitor, pulse oximetry, supplemental oxygen as necessary.  Sedation given: Propofol per anesthesia Pacer pads placed anterior and posterior chest.  Cardioverted 3 time(s).  Cardioverted at 150J, 200J x 2.  Impression: Findings: Post procedure EKG shows: NSR Complications: None Patient did tolerate procedure well.  Plan: 1. Ultimately successful DCCV to NSR after 3 shocks.  Time Spent Directly with the Patient:  30 minutes   Pixie Casino, MD, [facility] Attending Cardiologist [place] 12/02/2016, 2:13 PM

## 2016-12-02 NOTE — H&P (Signed)
    INTERVAL PROCEDURE H&P  History and Physical Interval Note:  12/02/2016 1:46 PM  [name] has presented today for their planned procedure. The various methods of treatment have been discussed with the patient and family. After consideration of risks, benefits and other options for treatment, the patient has consented to the procedure.  The patients' outpatient history has been reviewed, patient examined, and no change in status from most recent office note within the past 30 days. I have reviewed the patients' chart and labs and will proceed as planned. Questions were answered to the patient's satisfaction.   Pixie Casino, MD, [facility] Attending Cardiologist [place] 12/02/2016, 1:46 PM

## 2016-12-02 NOTE — Discharge Instructions (Signed)
Electrical Cardioversion, Care After °This sheet gives you information about how to care for yourself after your procedure. Your health care provider may also give you more specific instructions. If you have problems or questions, contact your health care provider. °What can I expect after the procedure? °After the procedure, it is common to have: °· Some redness on the skin where the shocks were given. °Follow these instructions at home: °· Do not drive for 24 hours if you were given a medicine to help you relax (sedative). °· Take over-the-counter and prescription medicines only as told by your health care provider. °· Ask your health care provider how to check your pulse. Check it often. °· Rest for 48 hours after the procedure or as told by your health care provider. °· Avoid or limit your caffeine use as told by your health care provider. °Contact a health care provider if: °· You feel like your heart is beating too quickly or your pulse is not regular. °· You have a serious muscle cramp that does not go away. °Get help right away if: °· You have discomfort in your chest. °· You are dizzy or you feel faint. °· You have trouble breathing or you are short of breath. °· Your speech is slurred. °· You have trouble moving an arm or leg on one side of your body. °· Your fingers or toes turn cold or blue. °This information is not intended to replace advice given to you by your health care provider. Make sure you discuss any questions you have with your health care provider. °Document Released: 06/07/2013 Document Revised: 03/20/2016 Document Reviewed: 02/21/2016 °Elsevier Interactive Patient Education © 2017 [employer] ° °

## 2016-12-02 NOTE — Anesthesia Preprocedure Evaluation (Addendum)
Anesthesia Evaluation  Patient identified by MRN, date of birth, ID band Patient awake    Reviewed: Allergy & Precautions, NPO status , Patient's Chart, lab work & pertinent test results  Airway Mallampati: III  TM Distance: >3 FB Neck ROM: Full    Dental no notable dental hx.    Pulmonary neg pulmonary ROS, sleep apnea ,    Pulmonary exam normal breath sounds clear to auscultation       Cardiovascular hypertension, Pt. on medications Normal cardiovascular exam+ dysrhythmias Atrial Fibrillation  Rhythm:Irregular  Echo 10/2016  - Left ventricle: The cavity size was normal. Wall thickness was increased in a pattern of mild LVH. Systolic function was mildly to moderately reduced. The estimated ejection fraction was in the range of 40% to 45%. Diffuse hypokinesis. - Ascending aorta: The ascending aorta was mildly dilated. - Left atrium: The atrium was moderately dilated. - Right atrium: The atrium was mildly dilated.  Impressions: - Technically difficult; definity used; pt in atrial fibrillation   during the study; mild to moderate global reduction in LV   function; mild LVH; biatrial enlargement; PFO closure device with  no residual shunt.   Neuro/Psych TIAnegative neurological ROS  negative psych ROS   GI/Hepatic Neg liver ROS, GERD  ,  Endo/Other  diabetes, Type 2, Oral Hypoglycemic Agents  Renal/GU Renal diseasenegative Renal ROS  negative genitourinary   Musculoskeletal  (+) Arthritis ,   Abdominal   Peds  Hematology negative hematology ROS (+)   Anesthesia Other Findings   Reproductive/Obstetrics negative OB ROS                           Anesthesia Physical Anesthesia Plan  ASA: III  Anesthesia Plan: General   Post-op Pain Management:    Induction: Intravenous  Airway Management Planned: Mask  Additional Equipment:   Intra-op Plan:   Post-operative Plan:   Informed  Consent: I have reviewed the patients History and Physical, chart, labs and discussed the procedure including the risks, benefits and alternatives for the proposed anesthesia with the patient or authorized representative who has indicated his/her understanding and acceptance.   Dental advisory given  Plan Discussed with: CRNA  Anesthesia Plan Comments:         Anesthesia Quick Evaluation

## 2016-12-02 NOTE — Anesthesia Postprocedure Evaluation (Signed)
Anesthesia Post Note  Patient: [name]  Procedure(s) Performed: Procedure(s) (LRB): CARDIOVERSION (N/A)  Patient location during evaluation: PACU Anesthesia Type: General Level of consciousness: sedated and patient cooperative Pain management: pain level controlled Vital Signs Assessment: post-procedure vital signs reviewed and stable Respiratory status: spontaneous breathing Cardiovascular status: stable Anesthetic complications: no       Last Vitals:  Vitals:   12/02/16 1433 12/02/16 1443  BP: (!) 146/96 (!) 150/95  Pulse: (!) 59 60  Resp: 16 16  Temp:      Last Pain:  Vitals:   12/02/16 1343  TempSrc: Oral                 Nolon Nations

## 2016-12-02 NOTE — Transfer of Care (Signed)
Immediate Anesthesia Transfer of Care Note  Patient: [name]  Procedure(s) Performed: Procedure(s): CARDIOVERSION (N/A)  Patient Location: Endoscopy Unit  Anesthesia Type:General  Level of Consciousness: sedated and responds to stimulation  Airway & Oxygen Therapy: Patient Spontanous Breathing and Patient connected to nasal cannula oxygen  Post-op Assessment: Report given to RN and Post -op Vital signs reviewed and stable  Post vital signs: Reviewed and stable  Last Vitals:  Vitals:   12/02/16 1343  BP: (!) 161/97  Pulse: 71  Resp: (!) 25  Temp: 36.9 C    Last Pain:  Vitals:   12/02/16 1343  TempSrc: Oral         Complications: No apparent anesthesia complications

## 2016-12-03 ENCOUNTER — Encounter (HOSPITAL_COMMUNITY): Payer: Self-pay | Admitting: Internal Medicine

## 2016-12-07 ENCOUNTER — Other Ambulatory Visit: Payer: Self-pay | Admitting: Family Medicine

## 2016-12-07 MED FILL — PANTOPRAZOLE SOD DR 40 MG T: 40 | 90 days supply | Qty: 90 | Fill #1

## 2016-12-07 MED FILL — CARTIA XT 240 MG CAPSULE: 240 | 90 days supply | Qty: 90 | Fill #2

## 2016-12-07 MED FILL — ROSUVASTATIN CALCIUM 10 MG: 10 | 90 days supply | Qty: 90 | Fill #0

## 2016-12-07 MED FILL — TALTZ 80 MG/ML AUTOINJECTOR: 80 | 30 days supply | Qty: 1 | Fill #10

## 2016-12-07 MED FILL — BENAZEPRIL HCL 40 MG TABLET: 40 | 90 days supply | Qty: 90 | Fill #2

## 2016-12-08 ENCOUNTER — Telehealth: Payer: Self-pay | Admitting: Physician Assistant

## 2016-12-08 NOTE — Progress Notes (Signed)
Patient came to the office reporting that the additional information requested was here and asking for me to complete his DOT medical evaluation.  Chart is reviewed. After his visit here on 11/20/2016, he presented to cardiology with recurrent AFib, and the underwent cardioversion on 12/02/2016. EF 10/23/2016 was 40-45%.  I do not have a note from neurology, clearing him following the TIA he experienced about 7 years ago, thought due to AFib. AFib at that time thought due to PFO, which was occluded by Dr. Einar Gip.  Note from Dr. Leonie Man that he has not seen the patient since the original diagnosis of TIA, and he needs a visit for assessment and clearance.  I have not heard back from the patient's PCP, but given Dr. Clydene Fake note, sounds like there is not likely a neurology clearance note in their records pre-EMR.

## 2016-12-08 NOTE — Telephone Encounter (Signed)
l/m with chelles note

## 2016-12-08 NOTE — Telephone Encounter (Signed)
Please call this patient.  I still do not have a note from neurology, clearing him from that standpoint. His PCP can refer him back to neurology for that.  I am reviewing his recent cardiology notes, as he has had a recent cardioversion for AFib.

## 2016-12-11 ENCOUNTER — Telehealth: Payer: Self-pay | Admitting: Medical

## 2016-12-11 NOTE — Telephone Encounter (Signed)
[name] - please pull his paper chart for me.   Let him know that Dr. Redmond School spoke to me about him and his recent DOT.   After reviewing his recent DOT exam notes, I have contacted Dr. Gwenlyn Found about clearance for DOT.  He should have f/u appt with Dr. Gwenlyn Found regarding his cardioversion and recheck on blood pressure.  His recent blood pressures have been higher than acceptable for DOT as well per chart records.   Dr. Gwenlyn Found hasn't given clearance yet.   If he doesn't already have appt with Dr. Gwenlyn Found, make sure he calls to schedule.    Did he get temporary DOT clearance at [facility] or a disqualification?    We will await cardiology clearance.

## 2016-12-14 MED FILL — ACCU-CHEK FASTCLIX LANCETS: 51 days supply | Qty: 102 | Fill #1

## 2016-12-14 MED FILL — ACCU-CHEK GUIDE TEST STRIP: 50 days supply | Qty: 100 | Fill #1

## 2016-12-17 ENCOUNTER — Encounter: Payer: Self-pay | Admitting: Cardiovascular Disease

## 2016-12-17 ENCOUNTER — Telehealth: Payer: Self-pay | Admitting: Medical

## 2016-12-17 NOTE — Telephone Encounter (Signed)
Called pt he said that he is going to call dr.barry office and have him send something to [place] Clearing him .

## 2016-12-17 NOTE — Telephone Encounter (Signed)
See other message.  Please make sure he is aware that Dr. Gwenlyn Found needs to see him back before we can clear him.    Help facilitate quick f/u with Dr. Gwenlyn Found /cardiology.    Patient's DOT certificate is delayed until this.  He called Dr. Redmond School yesterday for update, please call him today

## 2017-01-05 ENCOUNTER — Ambulatory Visit (INDEPENDENT_AMBULATORY_CARE_PROVIDER_SITE_OTHER): Payer: 59 | Admitting: Cardiovascular Disease

## 2017-01-05 ENCOUNTER — Encounter: Payer: Self-pay | Admitting: Cardiovascular Disease

## 2017-01-05 ENCOUNTER — Telehealth: Payer: Self-pay | Admitting: Medical

## 2017-01-05 VITALS — BP 142/84 | HR 83 | Ht 69.5 in | Wt 291.6 lb

## 2017-01-05 DIAGNOSIS — E1159 Type 2 diabetes mellitus with other circulatory complications: Secondary | ICD-10-CM | POA: Diagnosis not present

## 2017-01-05 DIAGNOSIS — E785 Hyperlipidemia, unspecified: Secondary | ICD-10-CM | POA: Diagnosis not present

## 2017-01-05 DIAGNOSIS — I481 Persistent atrial fibrillation: Secondary | ICD-10-CM | POA: Diagnosis not present

## 2017-01-05 DIAGNOSIS — I4819 Other persistent atrial fibrillation: Secondary | ICD-10-CM

## 2017-01-05 DIAGNOSIS — E1169 Type 2 diabetes mellitus with other specified complication: Secondary | ICD-10-CM | POA: Diagnosis not present

## 2017-01-05 DIAGNOSIS — I10 Essential (primary) hypertension: Secondary | ICD-10-CM | POA: Diagnosis not present

## 2017-01-05 DIAGNOSIS — I152 Hypertension secondary to endocrine disorders: Secondary | ICD-10-CM

## 2017-01-05 DIAGNOSIS — G4733 Obstructive sleep apnea (adult) (pediatric): Secondary | ICD-10-CM | POA: Diagnosis not present

## 2017-01-05 NOTE — Patient Instructions (Signed)
Dr Berry recommends that you schedule a follow-up appointment in 12 months. You will receive a reminder letter in the mail two months in advance. If you don't receive a letter, please call our office to schedule the follow-up appointment.  If you need a refill on your cardiac medications before your next appointment, please call your pharmacy. 

## 2017-01-05 NOTE — Telephone Encounter (Signed)
Please get a new set of DOT forms from front office.  Have him return at his convenience for brief visit to complete forms since I have reviewed Dr. Kennon Holter notes from yesterday.

## 2017-01-05 NOTE — Assessment & Plan Note (Signed)
[name] underwent successful outpatient cardioversion by Dr. Debara Pickett after 3 shocks. He was on Xarelto oral anticoagulation. He currently is back in A. fib rate controlled and is unaware of this. At this point, we will keep him in A. fib.

## 2017-01-05 NOTE — Assessment & Plan Note (Signed)
History of hypertension blood pressure measured at 142/84. He is on benazepril and diltiazem. Continue current meds at current dosing

## 2017-01-05 NOTE — Assessment & Plan Note (Signed)
History of obstructive sleep apnea on CPAP which he benefits from 

## 2017-01-05 NOTE — Assessment & Plan Note (Signed)
History of hyperlipidemia on statin therapy with recent lipid profile performed 10/13/16 revealing total cholesterol 150, LDL of 48 and HDL 37. His triglyceride level is 326 he does also admit to dietary indiscretion.

## 2017-01-05 NOTE — Progress Notes (Signed)
01/05/2017 [name]   1960-09-06  [id number]  Primary Physician Denita Lung, MD Primary Cardiologist: Lorretta Harp MD Renae Gloss  HPI:  Mr. Usrey is a 56 year old severely overweight very Caucasian male father of 2 children and works as a Clinical biochemist. He was referred by Dr. Redmond School for cardiovascular evaluation because of atrial fibrillation. I apparently took care of him 7 years ago. I recently saw him in the office 11/24/16 . He did have a clean coronary By Dr. little back 17 years ago. He had a TIA 7 years ago and had A. fib at that time. He was cardioverted twice during the same setting. He was also found to have a PFO and had a PFO occluded by Dr. Nadyne Coombes. He also has polycythemia and undergoes occasional phlebotomy. He complains of some shortness of breath but denies chest pain. He has never smoked. Does have type 2 diabetes, hypertension and questionable hyperlipidemia in the past. I began him on oral anticoagulation at his last office visit for stroke prevention. A 2-D echocardiogram performed 2/23/6 team revealed moderate LV dysfunction with an EF of 40-45% and moderate global hypokinesia with mild concentric LVH and moderate left atrial enlargement. He underwent successful outpatient cardioversion by Dr. Debara Pickett were/4/18 after 3 shocks. He currently is in A. fib rate controlled and is unaware of this. 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  Current Outpatient Prescriptions  Medication Sig Dispense Refill  . ACCU-CHEK FASTCLIX LANCETS [place] Patient is to check BID DX: E11.9 102 each 4  . aspirin 81 MG tablet Take 81 mg by mouth daily.    . benazepril (LOTENSIN) 40 MG tablet Take 1 tablet (40 mg total) by mouth daily. 90 tablet 3  . diltiazem (CARDIZEM CD) 240 MG 24 hr capsule Take 1 capsule (240 mg total) by mouth daily. 90 capsule 3  . glucose blood test strip This is for Accu-chek fastclick   Patient is to test BID DX E11.9 100 each 12  . Ixekizumab (TALTZ) 80 MG/ML SOAJ Inject 80 mg into the skin every 30 (thirty) days. 1 mL 9  . metFORMIN (GLUCOPHAGE-XR) 500 MG 24 hr tablet Take 1 tablet (500 mg total) by mouth 2 (two) times daily. 60 tablet 5  . pantoprazole (PROTONIX) 40 MG tablet Take 40 mg by mouth every other day.    . rivaroxaban (XARELTO) 20 MG TABS tablet Take 1 tablet (20 mg total) by mouth daily with supper. 90 tablet 3  . rosuvastatin (CRESTOR) 10 MG tablet TAKE 1 TABLET BY MOUTH ONCE DAILY 90 tablet 2  . Vitamin D, Cholecalciferol, 1000 units CAPS Take 1 capsule by mouth daily.     No current facility-administered medications for this visit.     No Known Allergies  Social History   Social History  . Marital status: Married    Spouse name: N/A  . Number of children: 2  . Years of education: N/A   Occupational History  . [employer]   Social History Main Topics  . Smoking status: Never Smoker  . Smokeless tobacco: Never Used  . Alcohol use Yes     Comment: 2/month  . Drug use: No  . Sexual activity: Yes   Other Topics Concern  . Not on file   Social History Narrative  . No narrative on file     Review of Systems: General:  negative for chills, fever, night sweats or weight changes.  Cardiovascular: negative for chest pain, dyspnea on exertion, edema, orthopnea, palpitations, paroxysmal nocturnal dyspnea or shortness of breath Dermatological: negative for rash Respiratory: negative for cough or wheezing Urologic: negative for hematuria Abdominal: negative for nausea, vomiting, diarrhea, bright red blood per rectum, melena, or hematemesis Neurologic: negative for visual changes, syncope, or dizziness All other systems reviewed and are otherwise negative except as noted above.    Blood pressure (!) 142/84, pulse 83, height 5' 9.5" (1.765 m), weight 291 lb 9.6 oz (132.3 kg).  General appearance: alert and no  distress Neck: no adenopathy, no carotid bruit, no JVD, supple, symmetrical, trachea midline and thyroid not enlarged, symmetric, no tenderness/mass/nodules Lungs: clear to auscultation bilaterally Heart: irregularly irregular rhythm Extremities: extremities normal, atraumatic, no cyanosis or edema  EKG atrial fibrillation with a ventricular response of 83 and Q waves across his precordium. I personally reviewed his EKG  ASSESSMENT AND PLAN:   Persistent atrial fibrillation [facility]) Mr. Kloss underwent successful outpatient cardioversion by Dr. Debara Pickett after 3 shocks. He was on Xarelto oral anticoagulation. He currently is back in A. fib rate controlled and is unaware of this. At this point, we will keep him in A. fib.  Obstructive sleep apnea History of obstructive sleep apnea on CPAP which he benefits from  Hypertension associated with diabetes ([place]) History of hypertension blood pressure measured at 142/84. He is on benazepril and diltiazem. Continue current meds at current dosing  Hyperlipidemia associated with type 2 diabetes mellitus ([place]) History of hyperlipidemia on statin therapy with recent lipid profile performed 10/13/16 revealing total cholesterol 150, LDL of 48 and HDL 37. His triglyceride level is 326 he does also admit to dietary indiscretion.      Lorretta Harp MD FACP,FACC,FAHA, [facility] 01/05/2017 9:14 AM

## 2017-01-06 NOTE — Telephone Encounter (Signed)
Called pt came in for an appt . He is coming on 01/07/2017.

## 2017-01-07 ENCOUNTER — Ambulatory Visit (INDEPENDENT_AMBULATORY_CARE_PROVIDER_SITE_OTHER): Payer: 59 | Admitting: Medical

## 2017-01-07 ENCOUNTER — Other Ambulatory Visit: Payer: Self-pay | Admitting: Internal Medicine

## 2017-01-07 ENCOUNTER — Encounter: Payer: Self-pay | Admitting: Medical

## 2017-01-07 VITALS — BP 148/89 | HR 93 | Ht 69.0 in | Wt 290.8 lb

## 2017-01-07 DIAGNOSIS — E669 Obesity, unspecified: Secondary | ICD-10-CM

## 2017-01-07 DIAGNOSIS — Z8673 Personal history of transient ischemic attack (TIA), and cerebral infarction without residual deficits: Secondary | ICD-10-CM | POA: Diagnosis not present

## 2017-01-07 DIAGNOSIS — G4733 Obstructive sleep apnea (adult) (pediatric): Secondary | ICD-10-CM

## 2017-01-07 DIAGNOSIS — E119 Type 2 diabetes mellitus without complications: Secondary | ICD-10-CM | POA: Diagnosis not present

## 2017-01-07 DIAGNOSIS — E1159 Type 2 diabetes mellitus with other circulatory complications: Secondary | ICD-10-CM | POA: Diagnosis not present

## 2017-01-07 DIAGNOSIS — I10 Essential (primary) hypertension: Secondary | ICD-10-CM

## 2017-01-07 DIAGNOSIS — E1169 Type 2 diabetes mellitus with other specified complication: Secondary | ICD-10-CM

## 2017-01-07 DIAGNOSIS — I481 Persistent atrial fibrillation: Secondary | ICD-10-CM

## 2017-01-07 DIAGNOSIS — L4050 Arthropathic psoriasis, unspecified: Secondary | ICD-10-CM | POA: Diagnosis not present

## 2017-01-07 DIAGNOSIS — I152 Hypertension secondary to endocrine disorders: Secondary | ICD-10-CM

## 2017-01-07 DIAGNOSIS — E785 Hyperlipidemia, unspecified: Secondary | ICD-10-CM

## 2017-01-07 DIAGNOSIS — D45 Polycythemia vera: Secondary | ICD-10-CM | POA: Diagnosis not present

## 2017-01-07 DIAGNOSIS — I4819 Other persistent atrial fibrillation: Secondary | ICD-10-CM

## 2017-01-07 LAB — POCT URINALYSIS DIPSTICK
Bilirubin, UA: NEGATIVE
Blood, UA: NEGATIVE
Glucose, UA: NEGATIVE
Ketones, UA: NEGATIVE
Leukocytes, UA: NEGATIVE
Nitrite, UA: NEGATIVE
Spec Grav, UA: >=1.03 — AB (ref 1.010–1.025)
Urobilinogen, UA: NEGATIVE E.U./dL — AB
pH, UA: 6 (ref 5.0–8.0)

## 2017-01-07 NOTE — Progress Notes (Signed)
Subjective: Chief Complaint  Patient presents with  . Follow-up    folllow up b/p up    Here for f/u on BP, DOT f/u.    He sees Dr. Redmond School here for PCP and Dr. Quay Burow cardiology.  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  In the interim he was cardioverted and has had f/u with Dr. Gwenlyn Found 01/05/17.  He was cleared now that he is stable on medication although he remains in persistent Afib.  He is on xarelto, been tolerating this fine.  He is compliant with CPAP.  He understands he needs to make better diet choices nad lose weight.  He does consume more salt than needed. otherwise in usual state of health, feeling fine of late.  He is compliant with medications.  No other aggravating or relieving factors. No other complaint.  Past Medical History:  Diagnosis Date  . Arthritis   . Diverticulosis   . Dyslipidemia   . GERD (gastroesophageal reflux disease)   . Herpes labialis   . Hypertension   . Metabolic syndrome   . Obesity   . Proteinuria   . Psoriasis   . Seborrheic dermatitis   . Sleep apnea   . TIA (transient ischemic attack) 2009   Current Outpatient Prescriptions on File Prior to Visit  Medication Sig Dispense Refill  . ACCU-CHEK FASTCLIX LANCETS [place] Patient is to check BID DX: E11.9 102 each 4  . aspirin 81 MG tablet Take 81 mg by mouth daily.    . benazepril (LOTENSIN) 40 MG tablet Take 1 tablet (40 mg total) by mouth daily. 90 tablet 3  . diltiazem (CARDIZEM CD) 240 MG 24 hr capsule Take 1 capsule (240 mg total) by mouth daily. 90 capsule 3  . glucose blood test strip This is for Accu-chek fastclick  Patient is to test BID DX E11.9 100 each 12  . metFORMIN (GLUCOPHAGE-XR) 500 MG 24 hr tablet Take 1 tablet (500 mg total) by mouth 2 (two) times daily. 60 tablet 5  . pantoprazole (PROTONIX) 40 MG tablet Take 40 mg by mouth every other day.    . rivaroxaban (XARELTO) 20 MG TABS tablet Take 1  tablet (20 mg total) by mouth daily with supper. 90 tablet 3  . rosuvastatin (CRESTOR) 10 MG tablet TAKE 1 TABLET BY MOUTH ONCE DAILY 90 tablet 2  . Vitamin D, Cholecalciferol, 1000 units CAPS Take 1 capsule by mouth daily.    . Ixekizumab (TALTZ) 80 MG/ML SOAJ Inject 80 mg into the skin every 30 (thirty) days. 1 mL 9   No current facility-administered medications on file prior to visit.    ROS as in subjective   Objective: BP (!) 148/89   Pulse 93   Ht 5\' 9"  (1.753 m)   Wt 290 lb 12.8 oz (131.9 kg)   SpO2 95%   BMI 42.94 kg/m   General appearance: alert, no distress, WD/WN, obese white male Skin: no worrisome findings otherwise  HEENT: normocephalic, conjunctiva/corneas normal, sclerae anicteric, PERRLA, EOMi, nares patent, no discharge or erythema, pharynx normal Oral cavity: MMM, tongue normal, teeth in good repair Neck: supple, no lymphadenopathy, no thyromegaly, no masses, normal ROM, no bruits Chest: non tender, normal shape and expansion Heart: irregularly irregular, normal S1, S2, no murmurs Lungs: CTA bilaterally, no wheezes, rhonchi, or rales Abdomen: +bs, soft, obese, non tender, non distended, no masses, no hepatomegaly, no splenomegaly, no  bruits Back: non tender, normal ROM, no scoliosis Musculoskeletal: left buttock/posterior hip surgical scar, left forearm dorsal with diagonal long scar from childhood trauma, otherwise upper extremities non tender, no obvious deformity, normal ROM throughout, lower extremities non tender, no obvious deformity, normal ROM throughout Extremities: no edema, no cyanosis, no clubbing Pulses: 2+ symmetric, upper and lower extremities, normal cap refill Neurological: alert, oriented x 3, CN2-12 intact, strength normal upper extremities and lower extremities, sensation normal throughout, DTRs 2+ throughout, no cerebellar signs, gait normal Psychiatric: normal affect, behavior normal, pleasant  GU: no hernia Rectal:  deferred   Assessment: Encounter Diagnoses  Name Primary?  . Persistent atrial fibrillation ([place]) Yes  . Hypertension associated with diabetes ([place])   . Obstructive sleep apnea   . Type 2 diabetes mellitus without complication, without long-term current use of insulin ([place])   . Hyperlipidemia associated with type 2 diabetes mellitus ([place])   . Psoriatic arthritis ([place])   . Obesity (BMI 30-39.9)   . Polycythemia vera ([place])   . Hx-TIA (transient ischemic attack)      Plan: I reviewed his recent DOT physical at urgent care, recent visit with Dr. Redmond School here and recent cardiology notes.   Per Dr. Kennon Holter notes from 01/05/17.  Dr. Gwenlyn Found noted he was clear/stable to drive.   See 01/05/17 cardiology notes for further evaluation and recommendations, but he has hx/o TIA 7 years ago, had PFO occluded by Dr. Einar Gip in the past, recent had cardioversion but he remained in Afib.  He is stable though on anticoagulation and medications for Afib and HTN.  He is compliant with CPAP, is compliant with medications for lipids, BP, diabetes, afib.  He gets phlebotomy and is followed by hematology regarding polycythemia.  His is getting 130-140/80s at home, and his recent BP with cardiology reviewed.   I will approve him for 1 year certificate.   I advised that he see eye doctor prior to next DOT as he may end up needing corrective lenses by that point.   He was also advised to lose weight, to really get a better handle on diet and salt intake.  Advised to c/t monitoring BP as he may need titration if running higher than goal.   F/u yearly with me for DOT, otherwise f/u as planned with cardiology, PCP.

## 2017-01-08 ENCOUNTER — Other Ambulatory Visit: Payer: Self-pay | Admitting: Pharmacist

## 2017-01-08 MED ORDER — IXEKIZUMAB 80 MG/ML ~~LOC~~ SOAJ: 80.0000 mg | SUBCUTANEOUS | 5 refills | Status: DC

## 2017-01-08 MED FILL — TALTZ 80 MG/ML AUTOINJECTOR: 80 | 30 days supply | Qty: 1 | Fill #0

## 2017-01-11 ENCOUNTER — Telehealth: Payer: Self-pay

## 2017-01-11 MED ORDER — ALCOHOL SWABS PADS: 1.0000 | MEDICATED_PAD | Freq: Two times a day (BID) | 3 refills | Status: DC

## 2017-01-11 MED FILL — SM ALCOHOL 70% PREP PADS: 70 | 50 days supply | Qty: 100 | Fill #0

## 2017-01-11 NOTE — Telephone Encounter (Signed)
Done

## 2017-01-11 NOTE — Telephone Encounter (Signed)
Pt needs alcohol swabs to [employer] .[name]

## 2017-01-15 ENCOUNTER — Ambulatory Visit: Payer: 59 | Admitting: Cardiovascular Disease

## 2017-01-18 MED FILL — XARELTO 20 MG TABLET: 20 | 90 days supply | Qty: 90 | Fill #1

## 2017-02-01 MED FILL — TALTZ 80 MG/ML AUTOINJECTOR: 80 | 30 days supply | Qty: 1 | Fill #1

## 2017-02-04 ENCOUNTER — Other Ambulatory Visit: Payer: Self-pay | Admitting: *Deleted

## 2017-02-04 VITALS — BP 118/88 | HR 81

## 2017-02-04 DIAGNOSIS — E119 Type 2 diabetes mellitus without complications: Secondary | ICD-10-CM

## 2017-02-04 LAB — POCT GLYCOSYLATED HEMOGLOBIN (HGB A1C): HEMOGLOBIN A1C: 6.4

## 2017-02-04 MED FILL — METFORMIN HCL ER 500 MG TAB: 500 | 90 days supply | Qty: 180 | Fill #1

## 2017-02-04 NOTE — Patient Outreach (Signed)
[name] was at the [facility] CM office to receive assistance with his Careers information officer devices and requested a POC HGB A1C as he says he has not had one done in the last 3 months. Assessed POC Hgb A1C and discussed results of 6.4% and estimated average glucose of 137. Diabetes self management assistance will continue to be provided via [place]. [name] states he is very pleased with the [employer] as it helps him with self management by holding him accountable. Barrington Ellison RN,CCM,CDE [place] Management Coordinator [facility] [employer] [phone] Office Fax [phone]

## 2017-02-23 DIAGNOSIS — G4733 Obstructive sleep apnea (adult) (pediatric): Secondary | ICD-10-CM | POA: Diagnosis not present

## 2017-03-01 MED FILL — ROSUVASTATIN CALCIUM 10 MG: 10 | 90 days supply | Qty: 90 | Fill #1

## 2017-03-01 MED FILL — TALTZ 80 MG/ML AUTOINJECTOR: 80 | 30 days supply | Qty: 1 | Fill #2

## 2017-03-22 MED FILL — BENAZEPRIL HCL 40 MG TABLET: 40 | 90 days supply | Qty: 90 | Fill #3

## 2017-03-22 MED FILL — CARTIA XT 240 MG CAPSULE: 240 | 90 days supply | Qty: 90 | Fill #3

## 2017-04-01 MED FILL — ACCU-CHEK GUIDE TEST STRIP: 50 days supply | Qty: 100 | Fill #2

## 2017-04-13 MED FILL — TALTZ 80 MG/ML AUTOINJECTOR: 80 | 28 days supply | Qty: 1 | Fill #3

## 2017-04-26 MED FILL — XARELTO 20 MG TABLET: 20 | 90 days supply | Qty: 90 | Fill #2

## 2017-04-27 ENCOUNTER — Other Ambulatory Visit: Payer: Self-pay | Admitting: Family Medicine

## 2017-04-27 MED FILL — METFORMIN HCL ER 500 MG TAB: 500 | 30 days supply | Qty: 60 | Fill #0

## 2017-05-05 MED FILL — TALTZ 80 MG/ML AUTOINJECTOR: 80 | 28 days supply | Qty: 1 | Fill #4

## 2017-05-10 DIAGNOSIS — L814 Other melanin hyperpigmentation: Secondary | ICD-10-CM | POA: Diagnosis not present

## 2017-05-10 DIAGNOSIS — C44311 Basal cell carcinoma of skin of nose: Secondary | ICD-10-CM | POA: Diagnosis not present

## 2017-05-10 DIAGNOSIS — L720 Epidermal cyst: Secondary | ICD-10-CM | POA: Diagnosis not present

## 2017-05-10 DIAGNOSIS — Z79899 Other long term (current) drug therapy: Secondary | ICD-10-CM | POA: Diagnosis not present

## 2017-05-10 DIAGNOSIS — L400 Psoriasis vulgaris: Secondary | ICD-10-CM | POA: Diagnosis not present

## 2017-05-17 MED FILL — CEPHALEXIN 500 MG CAPSULE: 500 | 6 days supply | Qty: 19 | Fill #0

## 2017-05-26 DIAGNOSIS — G4733 Obstructive sleep apnea (adult) (pediatric): Secondary | ICD-10-CM | POA: Diagnosis not present

## 2017-05-28 MED FILL — METFORMIN HCL ER 500 MG TAB: 500 | 30 days supply | Qty: 60 | Fill #1

## 2017-05-28 MED FILL — ACCU-CHEK GUIDE TEST STRIP: 50 days supply | Qty: 100 | Fill #3

## 2017-06-07 MED FILL — TALTZ 80 MG/ML AUTOINJECTOR: 80 | 28 days supply | Qty: 1 | Fill #5

## 2017-06-08 DIAGNOSIS — C44311 Basal cell carcinoma of skin of nose: Secondary | ICD-10-CM | POA: Diagnosis not present

## 2017-06-08 DIAGNOSIS — Z85828 Personal history of other malignant neoplasm of skin: Secondary | ICD-10-CM | POA: Diagnosis not present

## 2017-06-09 MED FILL — ROSUVASTATIN CALCIUM 10 MG: 10 | 90 days supply | Qty: 90 | Fill #2

## 2017-06-14 ENCOUNTER — Other Ambulatory Visit: Payer: Self-pay | Admitting: Family Medicine

## 2017-06-14 MED FILL — PANTOPRAZOLE SOD DR 40 MG T: 40 | 30 days supply | Qty: 30 | Fill #0

## 2017-06-14 MED FILL — BENAZEPRIL HCL 40 MG TABLET: 40 | 30 days supply | Qty: 30 | Fill #0

## 2017-06-14 MED FILL — CARTIA XT 240 MG CAPSULE: 240 | 30 days supply | Qty: 30 | Fill #0

## 2017-06-14 NOTE — Telephone Encounter (Signed)
LM due for DM check. 30 day supply called in. / RLB

## 2017-06-21 ENCOUNTER — Ambulatory Visit (INDEPENDENT_AMBULATORY_CARE_PROVIDER_SITE_OTHER): Payer: 59 | Admitting: Family Medicine

## 2017-06-21 ENCOUNTER — Encounter: Payer: Self-pay | Admitting: Family Medicine

## 2017-06-21 VITALS — BP 130/70 | HR 67 | Ht 68.9 in | Wt 292.2 lb

## 2017-06-21 DIAGNOSIS — E785 Hyperlipidemia, unspecified: Secondary | ICD-10-CM | POA: Diagnosis not present

## 2017-06-21 DIAGNOSIS — Z23 Encounter for immunization: Secondary | ICD-10-CM | POA: Diagnosis not present

## 2017-06-21 DIAGNOSIS — I4819 Other persistent atrial fibrillation: Secondary | ICD-10-CM

## 2017-06-21 DIAGNOSIS — C44310 Basal cell carcinoma of skin of unspecified parts of face: Secondary | ICD-10-CM | POA: Diagnosis not present

## 2017-06-21 DIAGNOSIS — G4733 Obstructive sleep apnea (adult) (pediatric): Secondary | ICD-10-CM | POA: Diagnosis not present

## 2017-06-21 DIAGNOSIS — E1169 Type 2 diabetes mellitus with other specified complication: Secondary | ICD-10-CM

## 2017-06-21 DIAGNOSIS — I481 Persistent atrial fibrillation: Secondary | ICD-10-CM

## 2017-06-21 DIAGNOSIS — E119 Type 2 diabetes mellitus without complications: Secondary | ICD-10-CM | POA: Diagnosis not present

## 2017-06-21 DIAGNOSIS — E669 Obesity, unspecified: Secondary | ICD-10-CM

## 2017-06-21 DIAGNOSIS — E1159 Type 2 diabetes mellitus with other circulatory complications: Secondary | ICD-10-CM

## 2017-06-21 DIAGNOSIS — I10 Essential (primary) hypertension: Secondary | ICD-10-CM | POA: Diagnosis not present

## 2017-06-21 DIAGNOSIS — I152 Hypertension secondary to endocrine disorders: Secondary | ICD-10-CM

## 2017-06-21 NOTE — Patient Instructions (Signed)
Try Zantac, Pepcid, or Axid and see if you get good benefit from them for your reflux

## 2017-06-21 NOTE — Progress Notes (Signed)
Subjective:    Patient ID: [name], male    DOB: [birth year], 56 y.o.   MRN: [id number]  [name] is a 56 y.o. male who presents for follow-up of Type 2 diabetes mellitus.  Home blood sugar records: fasting range: 150 Current symptoms/problems include none and have been unchanged. Daily foot checks:yes   Any foot concerns: no  Exercise: The patient does not participate in regular exercise at present.does walk daily Diet:regularHe does note that the glucometer he is now using does show slightly more elevated blood sugars and then the past.He has had BCE surgery on the left side of his nose. He continues on metformin and is having no difficulty with that.He also continues on benazepril and cardia without difficulty. He is using Protonix on an every other day basis and getting good results. He cannot stretch this out to 3 days. Crestor is causing no muscle aches or pains. He continues on Xarelto for his underlying atrial fibrillation. The following portions of the patient's history were reviewed and updated as appropriate: allergies, current medications, past medical history, past social history and problem list.  ROS as in subjective above.     Objective:    Physical Exam Alert and in no distress .Scar noted on the left upper aspect of the nose that is healing nicely.  Blood pressure 130/70, pulse 67, height 5' 8.9" (1.75 m), weight 292 lb 3.2 oz (132.5 kg).  Lab Review Diabetic Labs Latest Ref Rng & Units 02/04/2017 11/24/2016 10/13/2016 05/26/2016 01/13/2016  HbA1c - 6.4 - 6.5 6.4 6.5  Microalbumin mg/L - - >300.0 - -  Micro/Creat Ratio - - - >391.1 - -  Chol <200 mg/dL - - 150 - -  HDL >40 mg/dL - - 37(L) - -  Calc LDL <100 mg/dL - - 48 - -  Triglycerides <150 mg/dL - - 326(H) - -  Creatinine 0.70 - 1.33 mg/dL - 1.21 1.22 - -   BP/Weight 06/21/2017 02/04/2017 01/07/2017 11/29/9377 0/10/4095  Systolic BP 353 299 242 683 419  Diastolic BP 70 88 89 84 95  Wt. (Lbs) 292.2 - 290.8  291.6 271  BMI 43.28 - 42.94 42.44 38.88   Foot/eye exam completion dates Latest Ref Rng & Units 10/13/2016 05/15/2016  Eye Exam No Retinopathy - No Retinopathy  Foot Form Completion - Done -  A1c is 6.3  [name]  reports that he has never smoked. He has never used smokeless tobacco. He reports that he drinks alcohol. He reports that he does not use drugs.     Assessment & Plan:    Type 2 diabetes mellitus without complication, without long-term current use of insulin (HCC)  Need for influenza vaccination - Plan: Flu Vaccine QUAD 6+ mos PF IM (Fluarix Quad PF)  Persistent atrial fibrillation (HCC)  Hypertension associated with diabetes ([place])  Obstructive sleep apnea  Hyperlipidemia associated with type 2 diabetes mellitus ([place])  Obesity (BMI 30-39.9)  1. Rx changes: none 2. Education: Reviewed 'ABCs' of diabetes management (respective goals in parentheses):  A1C (<7), blood pressure (<130/80), and cholesterol (LDL <100). 3. Compliance at present is estimated to be excellent. Efforts to improve compliance (if necessary) will be directed at increased exercise. 4. Follow up: 4 months Discussed diet and exercise with him as well as possible surgical intervention. Discussed several options with him and will refer to general surgery for further discussion of this. I think he would be an ideal candidate has so far his diet and exercise have been  marginally effective.

## 2017-06-22 DIAGNOSIS — H524 Presbyopia: Secondary | ICD-10-CM | POA: Diagnosis not present

## 2017-06-22 LAB — HM DIABETES EYE EXAM

## 2017-06-23 ENCOUNTER — Encounter: Payer: Self-pay | Admitting: Family Medicine

## 2017-06-30 MED FILL — METFORMIN HCL ER 500 MG TAB: 500 | 30 days supply | Qty: 60 | Fill #2

## 2017-07-20 ENCOUNTER — Other Ambulatory Visit: Payer: Self-pay | Admitting: Family Medicine

## 2017-07-20 MED FILL — ACCU-CHEK GUIDE TEST STRIP: 50 days supply | Qty: 100 | Fill #4

## 2017-07-26 ENCOUNTER — Other Ambulatory Visit: Payer: Self-pay | Admitting: Family Medicine

## 2017-07-26 DIAGNOSIS — T1512XA Foreign body in conjunctival sac, left eye, initial encounter: Secondary | ICD-10-CM | POA: Diagnosis not present

## 2017-07-26 MED FILL — CARTIA XT 240 MG CAPSULE: 240 | 30 days supply | Qty: 30 | Fill #0

## 2017-07-26 MED FILL — XARELTO 20 MG TABLET: 20 | 90 days supply | Qty: 90 | Fill #3

## 2017-07-26 MED FILL — METFORMIN HCL ER 500 MG TAB: 500 | 30 days supply | Qty: 60 | Fill #0

## 2017-07-26 MED FILL — BENAZEPRIL HCL 40 MG TABLET: 40 | 30 days supply | Qty: 30 | Fill #0

## 2017-07-26 MED FILL — PANTOPRAZOLE SOD DR 40 MG T: 40 | 30 days supply | Qty: 30 | Fill #0

## 2017-08-03 ENCOUNTER — Other Ambulatory Visit: Payer: Self-pay | Admitting: Internal Medicine

## 2017-08-04 ENCOUNTER — Other Ambulatory Visit: Payer: Self-pay

## 2017-08-04 NOTE — Patient Outreach (Signed)
[facility]) Care Management  08/04/2017  William TANDON Jr. 02-Aug-1961 [id number]   Case closed in [place].  Member is being followed by the [employer] program Member enrolled in an external program Peter Garter RN, [facility] Care Management Coordinator-Link to [place] Management [phone]

## 2017-08-23 ENCOUNTER — Other Ambulatory Visit: Payer: Self-pay | Admitting: Family Medicine

## 2017-08-23 MED FILL — CARTIA XT 240 MG CAPSULE: 240 | 30 days supply | Qty: 30 | Fill #0

## 2017-08-23 MED FILL — ROSUVASTATIN CALCIUM 10 MG: 10 | 90 days supply | Qty: 90 | Fill #0

## 2017-08-23 MED FILL — BENAZEPRIL HCL 40 MG TABLET: 40 | 30 days supply | Qty: 30 | Fill #0

## 2017-08-25 MED FILL — ULTICARE ALCOHOL SWABS 70 %: 70 | 50 days supply | Qty: 100 | Fill #1

## 2017-08-26 DIAGNOSIS — G4733 Obstructive sleep apnea (adult) (pediatric): Secondary | ICD-10-CM | POA: Diagnosis not present

## 2017-09-01 ENCOUNTER — Other Ambulatory Visit: Payer: Self-pay | Admitting: Internal Medicine

## 2017-09-01 ENCOUNTER — Other Ambulatory Visit: Payer: Self-pay | Admitting: Pharmacist

## 2017-09-01 MED ORDER — IXEKIZUMAB 80 MG/ML ~~LOC~~ SOAJ: 80.0000 mg | SUBCUTANEOUS | 3 refills | Status: DC

## 2017-09-01 MED FILL — METFORMIN HCL ER 500 MG TAB: 500 | 30 days supply | Qty: 60 | Fill #1

## 2017-09-06 ENCOUNTER — Encounter: Payer: Self-pay | Admitting: Nurse Practitioner

## 2017-09-06 ENCOUNTER — Ambulatory Visit (INDEPENDENT_AMBULATORY_CARE_PROVIDER_SITE_OTHER): Payer: Self-pay | Admitting: Nurse Practitioner

## 2017-09-06 VITALS — BP 146/98 | HR 60 | Temp 98.3°F | Resp 24 | Wt 289.2 lb

## 2017-09-06 DIAGNOSIS — J0100 Acute maxillary sinusitis, unspecified: Secondary | ICD-10-CM

## 2017-09-06 MED ORDER — DEXTROMETHORPHAN-GUAIFENESIN 10-100 MG/5ML PO LIQD: 10.0000 mL | Freq: Four times a day (QID) | ORAL | 0 refills | Status: AC | PRN

## 2017-09-06 MED ORDER — FLUTICASONE PROPIONATE 50 MCG/ACT NA SUSP: 2.0000 | Freq: Every day | NASAL | 0 refills | Status: DC

## 2017-09-06 MED ORDER — ALBUTEROL SULFATE HFA 108 (90 BASE) MCG/ACT IN AERS: 2.0000 | INHALATION_SPRAY | Freq: Four times a day (QID) | RESPIRATORY_TRACT | 0 refills | Status: DC | PRN

## 2017-09-06 MED ORDER — AMOXICILLIN-POT CLAVULANATE 875-125 MG PO TABS: 1.0000 | ORAL_TABLET | Freq: Two times a day (BID) | ORAL | 0 refills | Status: AC

## 2017-09-06 MED FILL — AMOX-CLAV 875-125 MG TABLET: 875-125 | 10 days supply | Qty: 20 | Fill #0

## 2017-09-06 MED FILL — TALTZ 80 MG/ML AUTOINJECTOR: 80 | 28 days supply | Qty: 1 | Fill #0

## 2017-09-06 MED FILL — FLUTICASONE PROP 50 MCG SPR: 50 | 30 days supply | Qty: 16 | Fill #0

## 2017-09-06 MED FILL — VENTOLIN HFA 90 MCG INHALER: 108 (90 BAS | 25 days supply | Qty: 18 | Fill #0

## 2017-09-06 NOTE — Progress Notes (Signed)
[name] said as he drove by [facility], their parking lot was empty.  He said he needed to be seen and it is now a free benefit with [facility].

## 2017-09-06 NOTE — Patient Instructions (Addendum)

## 2017-09-06 NOTE — Progress Notes (Signed)
Subjective:  [name]. is a 57 y.o. male who presents for evaluation of URI like symptoms.  Symptoms include headache described as dull, nasal congestion, post nasal drip, productive cough with greenish-brown colored sputum, sinus pressure, sneezing, sore throat and wheezing.  Onset of symptoms was 3 days ago, and has been rapidly worsening since that time.  Treatment to date:  Nyquil.  High risk factors for influenza complications:  co-morbid illness.  The following portions of the patient's history were reviewed and updated as appropriate:  allergies, current medications and past medical history.  Constitutional: positive for chills and fatigue, negative for anorexia and malaise Eyes: negative Ears, nose, mouth, throat, and face: positive for nasal congestion, snoring and sore throat, negative for ear drainage, earaches and hoarseness Respiratory: positive for cough Cardiovascular: negative Neurological: positive for headaches Behavioral/Psych: negative Objective:  BP (!) 146/98 (BP Location: Right Arm, Patient Position: Sitting, Cuff Size: Large)   Pulse 60   Temp 98.3 F (36.8 C) (Oral)   Resp (!) 24   Wt 289 lb 3.2 oz (131.2 kg)   SpO2 96%   BMI 42.83 kg/m  General appearance: alert, cooperative and fatigued Head: Normocephalic, without obvious abnormality, atraumatic Eyes: conjunctivae/corneas clear. PERRL, EOM's intact. Fundi benign. Ears: normal TM's and external ear canals both ears Nose: moderate congestion, turbinates swollen, inflamed, moderate maxillary sinus tenderness bilateral, mild frontal sinus tenderness bilateral, deviated septum Throat: lips, mucosa, and tongue normal; teeth and gums normal Lungs: wheezes anterior - left and expiratory Heart: irregularly irregular rhythm Pulses: 2+ and symmetric Skin: Skin color, texture, turgor normal. No rashes or lesions Lymph nodes: cervical nodes normal Neurologic: Grossly normal    Assessment:  sinusitis     Plan:  Discussed diagnosis and treatment of sinusitis. Educational material distributed and questions answered. Suggested symptomatic OTC remedies. Supportive care with appropriate antipyretics and fluids. Nasal saline spray for congestion. Augmentin per orders. Nasal steroids per orders. Follow up as needed. Humidifier.  Increase fluids.  Ibuprofen or Tylenol for pain, fever or general discomfort.  Albuterol inhaler as needed for wheezing.

## 2017-09-08 ENCOUNTER — Telehealth: Payer: Self-pay | Admitting: Nurse Practitioner

## 2017-09-08 NOTE — Telephone Encounter (Signed)
Called patient to follow up on his status.  Reached voicemail, left message for patient to return my call.

## 2017-09-20 ENCOUNTER — Other Ambulatory Visit: Payer: Self-pay | Admitting: Family Medicine

## 2017-09-20 MED FILL — PANTOPRAZOLE SOD DR 40 MG T: 40 | 30 days supply | Qty: 30 | Fill #0

## 2017-09-20 MED FILL — BENAZEPRIL HCL 40 MG TABLET: 40 | 30 days supply | Qty: 30 | Fill #0

## 2017-09-20 MED FILL — CARTIA XT 240 MG CAPSULE: 240 | 30 days supply | Qty: 30 | Fill #0

## 2017-09-29 ENCOUNTER — Institutional Professional Consult (permissible substitution): Payer: 59 | Admitting: Internal Medicine

## 2017-10-04 ENCOUNTER — Ambulatory Visit: Payer: 59 | Admitting: Internal Medicine

## 2017-10-04 ENCOUNTER — Encounter: Payer: Self-pay | Admitting: Internal Medicine

## 2017-10-04 VITALS — BP 132/74 | HR 83 | Ht 68.9 in | Wt 294.0 lb

## 2017-10-04 DIAGNOSIS — G4733 Obstructive sleep apnea (adult) (pediatric): Secondary | ICD-10-CM | POA: Diagnosis not present

## 2017-10-04 DIAGNOSIS — I10 Essential (primary) hypertension: Secondary | ICD-10-CM

## 2017-10-04 DIAGNOSIS — I481 Persistent atrial fibrillation: Secondary | ICD-10-CM | POA: Diagnosis not present

## 2017-10-04 DIAGNOSIS — I4819 Other persistent atrial fibrillation: Secondary | ICD-10-CM

## 2017-10-04 DIAGNOSIS — I152 Hypertension secondary to endocrine disorders: Secondary | ICD-10-CM

## 2017-10-04 DIAGNOSIS — E1159 Type 2 diabetes mellitus with other circulatory complications: Secondary | ICD-10-CM | POA: Diagnosis not present

## 2017-10-04 MED FILL — METFORMIN HCL ER 500 MG TAB: 500 | 30 days supply | Qty: 60 | Fill #2

## 2017-10-04 MED FILL — TALTZ 80 MG/ML AUTOINJECTOR: 80 | 28 days supply | Qty: 1 | Fill #1

## 2017-10-04 NOTE — Patient Instructions (Addendum)
Medication Instructions:  Your physician has recommended you make the following change in your medication:  1.  Stop taking aspirin.  Labwork: None ordered.  Testing/Procedures: None ordered.  Follow-Up: Your physician wants you to follow-up in: 6 months with Tommye Standard, PA.   You will receive a reminder letter in the mail two months in advance. If you don't receive a letter, please call our office to schedule the follow-up appointment.  Any Other Special Instructions Will Be Listed Below (If Applicable).  If you need a refill on your cardiac medications before your next appointment, please call your pharmacy.

## 2017-10-04 NOTE — Progress Notes (Signed)
Electrophysiology Office Note   Date:  10/04/2017   ID:  William Barges., DOB Oct 13, 1960, MRN [id number]  PCP:  William Lung, MD  Cardiologist:  Dr Gwenlyn Found Primary Electrophysiologist: Thompson Grayer, MD    Chief Complaint  Patient presents with  . Atrial Fibrillation     History of Present Illness: William Lindfors. is a 57 y.o. male who presents today for electrophysiology evaluation.   He is a 57 yo morbidly obese male with persistent afib, prior TIA, PFO closure by Dr [name], OSA + CPAP, moderate LV dysfunction who is self referred for EP consultation regarding his afib.  He has a CDL.  He is asymptomatic with his afib.   He is active.  He has struggled with obesity for years.    Today, he denies symptoms of palpitations, chest pain, shortness of breath, orthopnea, PND, lower extremity edema, claudication, dizziness, presyncope, syncope, bleeding, or neurologic sequela. The patient is tolerating medications without difficulties and is otherwise without complaint today.    Past Medical History:  Diagnosis Date  . Arthritis   . Diverticulosis   . Dyslipidemia   . GERD (gastroesophageal reflux disease)   . Herpes labialis   . Hypertension   . Longstanding persistent atrial fibrillation ([place])   . Metabolic syndrome   . Obesity   . Proteinuria   . Psoriasis   . Seborrheic dermatitis   . Sleep apnea    very compliant with CPAP  . TIA (transient ischemic attack) 2009   Past Surgical History:  Procedure Laterality Date  . CARDIOVERSION N/A 12/02/2016   Procedure: CARDIOVERSION;  Surgeon: Pixie Casino, MD;  Location: [facility] ENDOSCOPY;  Service: Cardiovascular;  Laterality: N/A;  . COLONOSCOPY    . JOINT REPLACEMENT  11/03/10   LT HIP  . TONSILLECTOMY AND ADENOIDECTOMY    . TOTAL HIP ARTHROPLASTY Left   . WISDOM TOOTH EXTRACTION       Current Outpatient Medications  Medication Sig Dispense Refill  . ACCU-CHEK FASTCLIX LANCETS [place] Patient is to check BID DX: E11.9 102  each 4  . albuterol (PROVENTIL HFA;VENTOLIN HFA) 108 (90 Base) MCG/ACT inhaler Inhale 2 puffs into the lungs every 6 (six) hours as needed for up to 5 days for wheezing or shortness of breath. 1 Inhaler 0  . Alcohol Swabs PADS 1 each by Does not apply route 2 (two) times daily. 120 each 3  . benazepril (LOTENSIN) 40 MG tablet TAKE 1 TABLET BY MOUTH DAILY. 30 tablet 0  . CARTIA XT 240 MG 24 hr capsule TAKE 1 CAPSULE BY MOUTH DAILY. 30 capsule 0  . glucose blood test strip This is for Accu-chek fastclick  Patient is to test BID DX E11.9 100 each 12  . Ixekizumab (TALTZ) 80 MG/ML SOAJ Inject 80 mg into the skin every 30 (thirty) days. 1 mL 3  . metFORMIN (GLUCOPHAGE-XR) 500 MG 24 hr tablet TAKE 1 TABLET BY MOUTH TWICE DAILY 60 tablet 2  . pantoprazole (PROTONIX) 40 MG tablet Take 40 mg by mouth every other day.    . pantoprazole (PROTONIX) 40 MG tablet TAKE 1 TABLET BY MOUTH ONCE DAILY 30 tablet 0  . rivaroxaban (XARELTO) 20 MG TABS tablet Take 1 tablet (20 mg total) by mouth daily with supper. 90 tablet 3  . rosuvastatin (CRESTOR) 10 MG tablet TAKE 1 TABLET BY MOUTH ONCE DAILY 90 tablet 2  . Vitamin D, Cholecalciferol, 1000 units CAPS Take 1 capsule by mouth daily.    Marland Kitchen  fluticasone (FLONASE) 50 MCG/ACT nasal spray Place 2 sprays into both nostrils daily for 10 days. 16 g 0   No current facility-administered medications for this visit.     Allergies:   Patient has no known allergies.   Social History:  The patient  reports that  has never smoked. he has never used smokeless tobacco. He reports that he drinks alcohol. He reports that he does not use drugs.   Family History:  The patient's  family history includes Cancer in his other; Crohn's disease in his mother; Esophageal cancer in his father; Obesity in his other; Uterine cancer (age of onset: 37) in his mother.    ROS:  Please see the history of present illness.   All other systems are personally reviewed and negative.    PHYSICAL  EXAM: VS:  BP 132/74   Pulse 83   Ht 5' 8.9" (1.75 m)   Wt 294 lb (133.4 kg)   BMI 43.54 kg/m  , BMI Body mass index is 43.54 kg/m. GEN: overweighty, in no acute distress  HEENT: normal  Neck: no JVD, carotid bruits, or masses Cardiac: iRRR; no murmurs, rubs, or gallops,no edema  Respiratory:  clear to auscultation bilaterally, normal work of breathing GI: soft, nontender, nondistended, + BS MS: no deformity or atrophy  Skin: warm and dry  Neuro:  Strength and sensation are intact Psych: euthymic mood, full affect  EKG:  EKG is ordered today. The ekg ordered today is personally reviewed and shows afib,  V rate 83 bpm, poor r wave progression  Echo 10/23/16 reveals EF 40-45%, biatrial enlargement  Recent Labs: 10/13/2016: ALT 24 11/24/2016: BUN 21; Creat 1.21; Hemoglobin 17.9; Platelets 169; Potassium 4.4; Sodium 134; TSH 1.10  personally reviewed   Lipid Panel     Component Value Date/Time   CHOL 150 10/13/2016 0956   TRIG 326 (H) 10/13/2016 0956   HDL 37 (L) 10/13/2016 0956   CHOLHDL 4.1 10/13/2016 0956   VLDL 65 (H) 10/13/2016 0956   LDLCALC 48 10/13/2016 0956   personally reviewed   Wt Readings from Last 3 Encounters:  10/04/17 294 lb (133.4 kg)  09/06/17 289 lb 3.2 oz (131.2 kg)  06/21/17 292 lb 3.2 oz (132.5 kg)      Other studies personally reviewed: Additional studies/ records that were reviewed today include: Dr Kennon Holter prior notes  Review of the above records today demonstrates: as above   ASSESSMENT AND PLAN:  1.  Longstanding persistent afib Rate controlled Asymptomatic chads2vasc score is 5.  Continue on xarelto Stop ASA  2. Nonischemic CM EF 40-45% Continue medical management long term Repeat echo upon return euvolemic  3. Obesity Body mass index is 43.54 kg/m. We discussed bariatric referral.  He is considering this strongly  4. OSA Compliant with CPAP and finds significant benefit  5. HTN Stable No change required  today  Follow-up:  Return to see EP PA in 6 months  Current medicines are reviewed at length with the patient today.   The patient does not have concerns regarding his medicines.  The following changes were made today:  none  Labs/ tests ordered today include:  Orders Placed This Encounter  Procedures  . EKG 12-Lead     Signed, Thompson Grayer, MD  10/04/2017 11:10 AM     [facility] [address] [place] [address] [phone] (office) [phone] (fax)

## 2017-10-05 ENCOUNTER — Encounter: Payer: Self-pay | Admitting: Gastroenterology

## 2017-10-05 MED FILL — ACCU-CHEK GUIDE TEST STRIP: 50 days supply | Qty: 100 | Fill #5

## 2017-10-18 ENCOUNTER — Ambulatory Visit (INDEPENDENT_AMBULATORY_CARE_PROVIDER_SITE_OTHER): Payer: 59 | Admitting: Family Medicine

## 2017-10-18 ENCOUNTER — Encounter: Payer: Self-pay | Admitting: Family Medicine

## 2017-10-18 VITALS — BP 140/90 | HR 78 | Ht 70.0 in | Wt 294.6 lb

## 2017-10-18 DIAGNOSIS — K219 Gastro-esophageal reflux disease without esophagitis: Secondary | ICD-10-CM

## 2017-10-18 DIAGNOSIS — E1159 Type 2 diabetes mellitus with other circulatory complications: Secondary | ICD-10-CM

## 2017-10-18 DIAGNOSIS — G4733 Obstructive sleep apnea (adult) (pediatric): Secondary | ICD-10-CM

## 2017-10-18 DIAGNOSIS — Z6379 Other stressful life events affecting family and household: Secondary | ICD-10-CM

## 2017-10-18 DIAGNOSIS — Z0000 Encounter for general adult medical examination without abnormal findings: Secondary | ICD-10-CM

## 2017-10-18 DIAGNOSIS — Z8673 Personal history of transient ischemic attack (TIA), and cerebral infarction without residual deficits: Secondary | ICD-10-CM | POA: Diagnosis not present

## 2017-10-18 DIAGNOSIS — L4050 Arthropathic psoriasis, unspecified: Secondary | ICD-10-CM

## 2017-10-18 DIAGNOSIS — I482 Chronic atrial fibrillation: Secondary | ICD-10-CM

## 2017-10-18 DIAGNOSIS — I4821 Permanent atrial fibrillation: Secondary | ICD-10-CM

## 2017-10-18 DIAGNOSIS — E1121 Type 2 diabetes mellitus with diabetic nephropathy: Secondary | ICD-10-CM

## 2017-10-18 DIAGNOSIS — D126 Benign neoplasm of colon, unspecified: Secondary | ICD-10-CM | POA: Insufficient documentation

## 2017-10-18 DIAGNOSIS — E785 Hyperlipidemia, unspecified: Secondary | ICD-10-CM

## 2017-10-18 DIAGNOSIS — I10 Essential (primary) hypertension: Secondary | ICD-10-CM

## 2017-10-18 DIAGNOSIS — D45 Polycythemia vera: Secondary | ICD-10-CM

## 2017-10-18 DIAGNOSIS — E1169 Type 2 diabetes mellitus with other specified complication: Secondary | ICD-10-CM

## 2017-10-18 DIAGNOSIS — I152 Hypertension secondary to endocrine disorders: Secondary | ICD-10-CM

## 2017-10-18 DIAGNOSIS — E669 Obesity, unspecified: Secondary | ICD-10-CM | POA: Diagnosis not present

## 2017-10-18 LAB — POCT URINALYSIS DIP (PROADVANTAGE DEVICE)
Bilirubin, UA: NEGATIVE
Blood, UA: NEGATIVE
Glucose, UA: NEGATIVE mg/dL
Ketones, POC UA: NEGATIVE mg/dL
LEUKOCYTES UA: NEGATIVE
Nitrite, UA: NEGATIVE
PH UA: 6 (ref 5.0–8.0)
Specific Gravity, Urine: 1.03
UUROB: 3.5

## 2017-10-18 LAB — POCT GLYCOSYLATED HEMOGLOBIN (HGB A1C): HEMOGLOBIN A1C: 6.5

## 2017-10-18 LAB — POCT UA - MICROALBUMIN
CREATININE, POC: 88.7 mg/dL
Microalbumin Ur, POC: >300 mg/L

## 2017-10-18 MED ORDER — METFORMIN HCL ER 500 MG PO TB24: 500.0000 mg | ORAL_TABLET | Freq: Two times a day (BID) | ORAL | 1 refills | Status: DC

## 2017-10-18 MED ORDER — BENAZEPRIL HCL 40 MG PO TABS: 40.0000 mg | ORAL_TABLET | Freq: Every day | ORAL | 3 refills | Status: DC

## 2017-10-18 MED ORDER — DILTIAZEM HCL ER COATED BEADS 240 MG PO CP24: 240.0000 mg | ORAL_CAPSULE | Freq: Every day | ORAL | 3 refills | Status: DC

## 2017-10-18 MED ORDER — ROSUVASTATIN CALCIUM 10 MG PO TABS: 10.0000 mg | ORAL_TABLET | Freq: Every day | ORAL | 3 refills | Status: DC

## 2017-10-18 MED ORDER — PANTOPRAZOLE SODIUM 40 MG PO TBEC: 40.0000 mg | DELAYED_RELEASE_TABLET | ORAL | 3 refills | Status: DC

## 2017-10-18 MED FILL — CARTIA XT 240 MG CAPSULE: 240 | 90 days supply | Qty: 90 | Fill #0

## 2017-10-18 MED FILL — PANTOPRAZOLE SOD DR 40 MG T: 40 | 90 days supply | Qty: 45 | Fill #0

## 2017-10-18 MED FILL — BENAZEPRIL HCL 40 MG TABLET: 40 | 90 days supply | Qty: 90 | Fill #0

## 2017-10-18 NOTE — Progress Notes (Signed)
Subjective:    Patient ID: [name]., male    DOB: 04-29-1961, 57 y.o.   MRN: [id number]  HPI He is here for complete examination.  He does have underlying diabetes and admits to having recent difficulty staying with his dietary program.  He had been referred for further counseling for consideration of surgical intervention but has not followed up on that.  He does check his blood sugars regularly also his feet.  He has been under a lot of stress dealing with the death of a cousin as well as continuing to have to deal with taking care of his father and also now his aunt on his wife's side.  He admits to stress eating because of all this.  He is continued to be followed by cardiology for his underlying atrial fib and presently is on Xarelto.  He has had a TIA in relation to this and PFO.  His reflux is under good control with every other day use of Prilosec.  He continues on Lotensin and [place] for his heart.  He continues to have difficulty with his OSA but is getting good relief with his CPAP.  In fact he has to use a CPAP even if he wants to take a nap.  He has underlying polycythemia.  His psoriatic arthritis is under adequate control according to him.  He also has a history of tubular adenoma 5 years ago and will need a referral.  His work and home life are going well.  Family and social history as well as health maintenance immunizations were reviewed.   Review of Systems  All other systems reviewed and are negative.      Objective:   Physical Exam BP 140/90 (BP Location: Right Arm, Patient Position: Sitting)   Pulse 78   Ht 5\' 10"  (1.778 m)   Wt 294 lb 9.6 oz (133.6 kg)   SpO2 94%   BMI 42.27 kg/m   General Appearance:    Alert, cooperative, no distress, appears stated age  Head:    Normocephalic, without obvious abnormality, atraumatic  Eyes:    PERRL, conjunctiva/corneas clear, EOM's intact, fundi    benign  Ears:    Normal TM's and external ear canals  Nose:   Nares  normal, mucosa normal, no drainage or sinus   tenderness  Throat:   Lips, mucosa, and tongue normal; teeth and gums normal  Neck:   Supple, no lymphadenopathy;  thyroid:  no   enlargement/tenderness/nodules; no carotid   bruit or JVD     Lungs:     Clear to auscultation bilaterally without wheezes, rales or     ronchi; respirations unlabored      Heart:   Irregular rate noted S1 and S2 normal, no murmur, rub   or gallop     Abdomen:     Soft, non-tender, nondistended, normoactive bowel sounds,    no masses, no hepatosplenomegaly  Genitalia:  deferred  Rectal:  deferred  Extremities:   No clubbing, cyanosis or edema  Pulses:   2+ and symmetric all extremities  Skin:   Skin color, texture, turgor normal, multiple psoriatic type lesions are noted on his extremities.  Lymph nodes:   Cervical, supraclavicular, and axillary nodes normal  Neurologic:   CNII-XII intact, normal strength, sensation and gait; reflexes 2+ and symmetric throughout          Psych:   Normal mood, affect, hygiene and grooming.          Assessment &  Plan:  Routine general medical examination at a health care facility - Plan: CBC with Differential/Platelet, Comprehensive metabolic panel, Lipid panel  Permanent atrial fibrillation (HCC)  Polycythemia vera ([place]) - Plan: CBC with Differential/Platelet  Psoriatic arthritis ([place]) - Plan: CBC with Differential/Platelet, Comprehensive metabolic panel  Obstructive sleep apnea  Obesity (BMI 30-39.9) - Plan: CBC with Differential/Platelet, Comprehensive metabolic panel, Lipid panel  Hypertension associated with diabetes ([place]) - Plan: benazepril (LOTENSIN) 40 MG tablet, diltiazem (CARTIA XT) 240 MG 24 hr capsule  Hx-TIA (transient ischemic attack)  Diabetic nephropathy associated with type 2 diabetes mellitus ([place]) - Plan: metFORMIN (GLUCOPHAGE-XR) 500 MG 24 hr tablet  Gastroesophageal reflux disease without esophagitis - Plan: pantoprazole (PROTONIX) 40 MG  tablet  Tubular adenoma of colon - Plan: Ambulatory referral to Gastroenterology  Type 2 diabetes mellitus with diabetic nephropathy, without long-term current use of insulin (HCC)  Hyperlipidemia associated with type 2 diabetes mellitus ([place]) - Plan: rosuvastatin (CRESTOR) 10 MG tablet  Stress due to illness of family member He will continue on his present medication regimen.  Again discussed the need for him to make lifestyle changes and regard mainly to his eating habits as he does keep himself physically active.  Discussed getting a consult concerning possible surgical intervention.  Otherwise he will continue on his present medication regimen. I again discussed the nephropathy with him and indicated that at the present time he is on appropriate dosing.

## 2017-10-18 NOTE — Addendum Note (Signed)
Addended by: Elyse Jarvis on: 10/18/2017 12:01 PM   Modules accepted: Orders

## 2017-10-19 LAB — LIPID PANEL
CHOL/HDL RATIO: 3 ratio (ref 0.0–5.0)
CHOLESTEROL TOTAL: 108 mg/dL (ref 100–199)
HDL: 36 mg/dL — AB (ref >39)
LDL Calculated: 27 mg/dL (ref 0–99)
TRIGLYCERIDES: 225 mg/dL — AB (ref 0–149)
VLDL Cholesterol Cal: 45 mg/dL — ABNORMAL HIGH (ref 5–40)

## 2017-10-19 LAB — CBC WITH DIFFERENTIAL/PLATELET
Basophils Absolute: 0 10*3/uL (ref 0.0–0.2)
Basos: 0 %
EOS (ABSOLUTE): 0.1 10*3/uL (ref 0.0–0.4)
Eos: 2 %
Hematocrit: 50.8 % (ref 37.5–51.0)
Hemoglobin: 17.6 g/dL (ref 13.0–17.7)
IMMATURE GRANULOCYTES: 1 %
Immature Grans (Abs): 0.1 10*3/uL (ref 0.0–0.1)
Lymphocytes Absolute: 2.7 10*3/uL (ref 0.7–3.1)
Lymphs: 36 %
MCH: 31.7 pg (ref 26.6–33.0)
MCHC: 34.6 g/dL (ref 31.5–35.7)
MCV: 91 fL (ref 79–97)
Monocytes Absolute: 0.6 10*3/uL (ref 0.1–0.9)
Monocytes: 9 %
NEUTROS PCT: 52 %
Neutrophils Absolute: 3.9 10*3/uL (ref 1.4–7.0)
PLATELETS: 168 10*3/uL (ref 150–379)
RBC: 5.56 x10E6/uL (ref 4.14–5.80)
RDW: 14.8 % (ref 12.3–15.4)
WBC: 7.4 10*3/uL (ref 3.4–10.8)

## 2017-10-19 LAB — COMPREHENSIVE METABOLIC PANEL
A/G RATIO: 1.8 (ref 1.2–2.2)
ALK PHOS: 54 IU/L (ref 39–117)
ALT: 31 IU/L (ref 0–44)
AST: 27 IU/L (ref 0–40)
Albumin: 4.2 g/dL (ref 3.5–5.5)
BUN/Creatinine Ratio: 16 (ref 9–20)
BUN: 18 mg/dL (ref 6–24)
Bilirubin Total: 1.1 mg/dL (ref 0.0–1.2)
CALCIUM: 9.4 mg/dL (ref 8.7–10.2)
CO2: 22 mmol/L (ref 20–29)
CREATININE: 1.15 mg/dL (ref 0.76–1.27)
Chloride: 98 mmol/L (ref 96–106)
GFR calc Af Amer: 81 mL/min/{1.73_m2} (ref >59)
GFR calc non Af Amer: 70 mL/min/{1.73_m2} (ref >59)
Globulin, Total: 2.4 g/dL (ref 1.5–4.5)
Glucose: 155 mg/dL — ABNORMAL HIGH (ref 65–99)
POTASSIUM: 4.3 mmol/L (ref 3.5–5.2)
Sodium: 139 mmol/L (ref 134–144)
Total Protein: 6.6 g/dL (ref 6.0–8.5)

## 2017-10-25 ENCOUNTER — Other Ambulatory Visit: Payer: Self-pay | Admitting: Cardiovascular Disease

## 2017-10-25 MED FILL — XARELTO 20 MG TABLET: 20 | 30 days supply | Qty: 30 | Fill #0

## 2017-10-27 MED FILL — TALTZ 80 MG/ML AUTOINJECTOR: 80 | 28 days supply | Qty: 1 | Fill #2

## 2017-10-28 MED FILL — METFORMIN HCL ER 500 MG TAB: 500 | 90 days supply | Qty: 180 | Fill #0

## 2017-11-02 ENCOUNTER — Ambulatory Visit: Payer: 59 | Admitting: Physician Assistant

## 2017-11-02 ENCOUNTER — Telehealth: Payer: Self-pay | Admitting: *Deleted

## 2017-11-02 ENCOUNTER — Encounter: Payer: Self-pay | Admitting: Physician Assistant

## 2017-11-02 VITALS — BP 122/78 | HR 68 | Ht 69.5 in | Wt 293.0 lb

## 2017-11-02 DIAGNOSIS — Z86010 Personal history of colonic polyps: Secondary | ICD-10-CM | POA: Diagnosis not present

## 2017-11-02 DIAGNOSIS — Z7901 Long term (current) use of anticoagulants: Secondary | ICD-10-CM

## 2017-11-02 DIAGNOSIS — Z1211 Encounter for screening for malignant neoplasm of colon: Secondary | ICD-10-CM

## 2017-11-02 NOTE — Progress Notes (Signed)
Reviewed and agree with management plan.  Wallice Granville T. Geanie Pacifico, MD FACG 

## 2017-11-02 NOTE — Progress Notes (Signed)
Subjective:    Patient ID: William Barges., male    DOB: [birth year], 57 y.o.   MRN: [id number]  HPI [name] is a very nice 57 year old white male, known to Dr. Fuller Plan, who comes in today to discuss follow-up colonoscopy. Patient has history of hypertension, chronic atrial fibrillation for which she is on Xarelto, obstructive sleep apnea, congestive heart failure with EF of 40-45%, adult-onset diabetes mellitus, psoriatic arthritis, polycythemia vera, has history of a TIA, and adenomatous colon polyps. He had undergone an EGD in January 2014 with finding of an irregular Z line otherwise negative exam. Biopsy showed no evidence of Barrett's esophagus. Colonoscopy done at that same time with finding of a 6 mm cecal polyp, and small internal hemorrhoids. Random biopsies were also done. Path on the polyp consistent with a tubular adenoma and random biopsies showed benign colonic mucosa. Patient has no current GI complaints. He says a week or so ago he did have a flu-like illness with severe diarrhea, chills headache but no respiratory symptoms. He has recuperated from that. Otherwise no changes in bowel habits complaints of abdominal pain melena or hematochezia. Patient is followed by Dr. Rayann Heman for cardiology, he says he is in chronic atrial fib. He has had 3 previous cardioversions. He has  been on Xarelto over the past 6 months  Review of Systems Pertinent positive and negative review of systems were noted in the above HPI section.  All other review of systems was otherwise negative.  Outpatient Encounter Medications as of 11/02/2017  Medication Sig  . ACCU-CHEK FASTCLIX LANCETS [place] Patient is to check BID DX: E11.9  . Alcohol Swabs PADS 1 each by Does not apply route 2 (two) times daily.  . benazepril (LOTENSIN) 40 MG tablet Take 1 tablet (40 mg total) by mouth daily.  Marland Kitchen diltiazem (CARTIA XT) 240 MG 24 hr capsule Take 1 capsule (240 mg total) by mouth daily.  Marland Kitchen glucose blood test strip This is  for Accu-chek fastclick  Patient is to test BID DX E11.9  . Ixekizumab (TALTZ) 80 MG/ML SOAJ Inject 80 mg into the skin every 30 (thirty) days.  . metFORMIN (GLUCOPHAGE-XR) 500 MG 24 hr tablet Take 1 tablet (500 mg total) by mouth 2 (two) times daily.  . pantoprazole (PROTONIX) 40 MG tablet Take 1 tablet (40 mg total) by mouth every other day.  . rosuvastatin (CRESTOR) 10 MG tablet Take 1 tablet (10 mg total) by mouth daily.  . Vitamin D, Cholecalciferol, 1000 units CAPS Take 1 capsule by mouth daily.  Alveda Reasons 20 MG TABS tablet TAKE 1 TABLET BY MOUTH DAILY WITH SUPPER.  . [DISCONTINUED] albuterol (PROVENTIL HFA;VENTOLIN HFA) 108 (90 Base) MCG/ACT inhaler Inhale 2 puffs into the lungs every 6 (six) hours as needed for up to 5 days for wheezing or shortness of breath.  . [DISCONTINUED] fluticasone (FLONASE) 50 MCG/ACT nasal spray Place 2 sprays into both nostrils daily for 10 days.   No facility-administered encounter medications on file as of 11/02/2017.    No Known Allergies Patient Active Problem List   Diagnosis Date Noted  . Tubular adenoma of colon 10/18/2017  . 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 Diabetic nephropathy associated with type 2 diabetes mellitus ([place]) 10/13/2016  . Permanent atrial fibrillation ([place]) 10/13/2016  . Polycythemia vera ([place]) 10/09/2014  . Hyperlipidemia associated with type 2 diabetes mellitus ([place]) 09/17/2014  . Hypertension associated with diabetes ([place]) 09/17/2014  . Diabetes mellitus ([place]) 10/31/2013  . Hx-TIA (transient ischemic  attack) 09/05/2012  . GERD (gastroesophageal reflux disease) 08/17/2011  . Obesity (BMI 30-39.9) 08/17/2011  . Psoriatic arthritis ([place]) 08/17/2011  . Obstructive sleep apnea 08/05/2009   Social History   Socioeconomic History  . Marital status: Married    Spouse name: Not on file  . Number of children: 2  . Years of education: Not on file  . Highest education level: Not on file  Social Needs  . Financial  resource strain: Not on file  . Food insecurity - worry: Not on file  . Food insecurity - inability: Not on file  . Transportation needs - medical: Not on file  . Transportation needs - non-medical: Not on file  Occupational History  . Occupation: Theatre manager: [employer]  Tobacco Use  . Smoking status: Never Smoker  . Smokeless tobacco: Never Used  Substance and Sexual Activity  . Alcohol use: Yes    Comment: 2/month  . Drug use: No  . Sexual activity: Yes  Other Topics Concern  . Not on file  Social History Narrative   Lives with spouse in [place]   Wife works for [employer] teaching program   Owns Advertising copywriter    Mr. Rewerts family history includes Cancer in his other; Crohn's disease in his mother; Esophageal cancer in his father; Obesity in his other; Uterine cancer (age of onset: 74) in his mother.      Objective:    Vitals:   11/02/17 0834  BP: 122/78  Pulse: 68    Physical Exam; well-developed white male in no acute distress, very pleasant talkative blood pressure 122/78, pulse 68, height 5 foot 9, weight 293, BMI 42.6. HEENT;nontraumatic normocephalic EOMI PERRLA sclera anicteric, Cardiovascular; irregular rate and rhythm with S1-S2 no murmur or gallop, Pulmonary; clear bilaterally,, Abdomen ;obese, soft nontender nondistended bowel sounds are active there is no palpable mass or hepatosplenomegaly, Rectal ;exam not done, Ext;no clubbing cyanosis or edema skin warm and dry, Neuropsych; mood and affect appropriate       Assessment & Plan:   #59 57 year old white male with history of adenomatous colon polyps who is due for follow-up colonoscopy. He is currently asymptomatic #2 chronic anticoagulation-on Xarelto #3 chronic atrial fibrillation #4 adult-onset diabetes mellitus #5 psoriatic arthritis #6 history of polycythemia vera #7 history of TIA #8 obstructive sleep apnea  Plan; Patient will be scheduled for colonoscopy with  Dr. Fuller Plan. Procedure was discussed in detail with the patient including indications risks and benefits and he is agreeable to proceed. He will need to hold Xarelto for 24 hours prior to procedure. We will communicate with his cardiologist Dr. Rayann Heman to assure that this is reasonable for this patient. Patient will continue Protonix for management of chronic GERD.  Yanette Tripoli S Loyed Wilmes PA-C 11/02/2017   Cc: Denita Lung, MD

## 2017-11-02 NOTE — Telephone Encounter (Signed)
  11/02/2017   RE: [name]. DOB: 1961/04/22 MRN: [id number]   Dear Dr. Thompson Grayer,    We have scheduled the above patient for an endoscopic procedure. Our records show that he is on anticoagulation therapy.   Please advise as to how long the patient may come off his therapy of Xarelto prior to the procedure, which is scheduled for 12-23-2017.  Please route the Xarelto clearance instructions to Marisue Humble CMA at fax # [phone].  Sincerely,    [name] Esterwood PA-C

## 2017-11-02 NOTE — Patient Instructions (Signed)
You have been scheduled for a colonoscopy. Please follow written instructions given to you at your visit today.  If you use inhalers (even only as needed), please bring them with you on the day of your procedure. Your physician has requested that you go to www.startemmi.com and enter the access code given to you at your visit today. This web site gives a general overview about your procedure. However, you should still follow specific instructions given to you by our office regarding your preparation for the procedure.  We have provided you with a prep sample- Suprep.  If you are age 79 or younger, your body mass index should be between 19-25. Your Body mass index is 42.65 kg/m. If this is out of the aformentioned range listed, please consider follow up with your Primary Care Provider.

## 2017-11-04 NOTE — Telephone Encounter (Signed)
Chart reviewed, will forward to pharmacy for instruction on Xarelto before contacting the patient for final clearance.

## 2017-11-05 NOTE — Telephone Encounter (Signed)
Left message to call back  Kerin Ransom PA-C 11/05/2017 3:39 PM

## 2017-11-05 NOTE — Telephone Encounter (Signed)
Patient with diagnosis of Afib on Xarelto for anticoagulation.    Procedure: endoscopy Date of procedure: 12/03/17  CHADS2-VASc score of  4 (CHF, HTN, AGE, DM2, stroke/tia x 2, CAD, AGE, male)  CrCl 132 ml/min  Per office protocol, patient can hold Xarelto for 24 hours prior to procedure. With history of TIA would recommend resume Xarelto as soon as safe.

## 2017-11-09 NOTE — Telephone Encounter (Signed)
Pam - please advise pt -see note below

## 2017-11-09 NOTE — Telephone Encounter (Signed)
   Primary Cardiologist: No primary care provider on file.  Chart reviewed as part of pre-operative protocol coverage. Given past medical history and time since last visit, based on ACC/AHA guidelines, Anders Hohmann. would be at acceptable risk for the planned procedure without further cardiovascular testing. He should stop Xarelto 24 hours prior to procedure and resume as soon as safe afterwards.    I will route this recommendation to the requesting party via Epic fax function and remove from pre-op pool.  Please call with questions.  Cecilie Kicks, NP 11/09/2017, 2:35 PM

## 2017-11-10 NOTE — Telephone Encounter (Signed)
Called the patient and advised him to hold his Xarelto on 4-24 and 4 25 and resume it after the procedure on the 25th.

## 2017-11-15 DIAGNOSIS — Z85828 Personal history of other malignant neoplasm of skin: Secondary | ICD-10-CM | POA: Diagnosis not present

## 2017-11-15 DIAGNOSIS — L720 Epidermal cyst: Secondary | ICD-10-CM | POA: Diagnosis not present

## 2017-11-15 DIAGNOSIS — L400 Psoriasis vulgaris: Secondary | ICD-10-CM | POA: Diagnosis not present

## 2017-11-15 DIAGNOSIS — L570 Actinic keratosis: Secondary | ICD-10-CM | POA: Diagnosis not present

## 2017-11-22 MED FILL — XARELTO 20 MG TABLET: 20 | 30 days supply | Qty: 30 | Fill #1

## 2017-11-24 DIAGNOSIS — G4733 Obstructive sleep apnea (adult) (pediatric): Secondary | ICD-10-CM | POA: Diagnosis not present

## 2017-11-30 ENCOUNTER — Other Ambulatory Visit: Payer: Self-pay | Admitting: Family Medicine

## 2017-11-30 MED FILL — TALTZ 80 MG/ML AUTOINJECTOR: 80 | 28 days supply | Qty: 1 | Fill #3

## 2017-11-30 MED FILL — ACCU-CHEK GUIDE STRP: 50 days supply | Qty: 100 | Fill #0

## 2017-12-09 ENCOUNTER — Encounter: Payer: Self-pay | Admitting: Gastroenterology

## 2017-12-20 MED FILL — ROSUVASTATIN CALCIUM 10 MG: 10 | 90 days supply | Qty: 90 | Fill #1

## 2017-12-21 MED FILL — XARELTO 20 MG TABLET: 20 | 30 days supply | Qty: 30 | Fill #2

## 2017-12-23 ENCOUNTER — Other Ambulatory Visit: Payer: Self-pay

## 2017-12-23 ENCOUNTER — Ambulatory Visit (AMBULATORY_SURGERY_CENTER): Payer: 59 | Admitting: Gastroenterology

## 2017-12-23 ENCOUNTER — Encounter: Payer: Self-pay | Admitting: Gastroenterology

## 2017-12-23 VITALS — BP 137/94 | HR 87 | Temp 96.6°F | Resp 16 | Ht 69.0 in | Wt 293.0 lb

## 2017-12-23 DIAGNOSIS — Z1211 Encounter for screening for malignant neoplasm of colon: Secondary | ICD-10-CM | POA: Diagnosis not present

## 2017-12-23 DIAGNOSIS — Z86010 Personal history of colonic polyps: Secondary | ICD-10-CM

## 2017-12-23 DIAGNOSIS — D120 Benign neoplasm of cecum: Secondary | ICD-10-CM

## 2017-12-23 DIAGNOSIS — D125 Benign neoplasm of sigmoid colon: Secondary | ICD-10-CM

## 2017-12-23 DIAGNOSIS — K635 Polyp of colon: Secondary | ICD-10-CM | POA: Diagnosis not present

## 2017-12-23 MED ORDER — SODIUM CHLORIDE 0.9 % IV SOLN: 500.0000 mL | Freq: Once | INTRAVENOUS | Status: DC

## 2017-12-23 NOTE — Progress Notes (Signed)
Called to room to assist during endoscopic procedure.  Patient ID and intended procedure confirmed with present staff. Received instructions for my participation in the procedure from the performing physician.  

## 2017-12-23 NOTE — Op Note (Signed)
[facility] Patient Name: [name] Procedure Date: 12/23/2017 7:58 AM MRN: [id number] Endoscopist: Ladene Artist , MD Age: 58 Referring MD:  Date of Birth: 1961-01-09 Gender: Male Account #: [id number] Procedure:                Colonoscopy Indications:              Surveillance: Personal history of adenomatous                            polyps on last colonoscopy 5 years ago Medicines:                Monitored Anesthesia Care Procedure:                Pre-Anesthesia Assessment:                           - Prior to the procedure, a History and Physical                            was performed, and patient medications and                            allergies were reviewed. The patient's tolerance of                            previous anesthesia was also reviewed. The risks                            and benefits of the procedure and the sedation                            options and risks were discussed with the patient.                            All questions were answered, and informed consent                            was obtained. Prior Anticoagulants: The patient has                            taken Xarelto (rivaroxaban), last dose was 2 days                            prior to procedure. ASA Grade Assessment: III - A                            patient with severe systemic disease. After                            reviewing the risks and benefits, the patient was                            deemed in satisfactory condition to undergo the  procedure.                           After obtaining informed consent, the colonoscope                            was passed under direct vision. Throughout the                            procedure, the patient's blood pressure, pulse, and                            oxygen saturations were monitored continuously. The                            Colonoscope was introduced through the anus and                   advanced to the the cecum, identified by                            appendiceal orifice and ileocecal valve. The                            ileocecal valve, appendiceal orifice, and rectum                            were photographed. The quality of the bowel                            preparation was good. The colonoscopy was performed                            without difficulty. The patient tolerated the                            procedure well. Scope In: 8:04:38 AM Scope Out: 8:17:18 AM Scope Withdrawal Time: 0 hours 10 minutes 38 seconds  Total Procedure Duration: 0 hours 12 minutes 40 seconds  Findings:                 The perianal and digital rectal examinations were                            normal.                           Two sessile polyps were found in the sigmoid colon                            and cecum. The polyps were 6 to 7 mm in size. These                            polyps were removed with a cold snare. Resection  and retrieval were complete.                           Many medium-mouthed diverticula were found in the                            left colon. There was no evidence of diverticular                            bleeding.                           Internal hemorrhoids were found during                            retroflexion. The hemorrhoids were small and Grade                            I (internal hemorrhoids that do not prolapse).                           The exam was otherwise without abnormality on                            direct and retroflexion views. Complications:            No immediate complications. Estimated blood loss:                            None. Estimated Blood Loss:     Estimated blood loss: none. Impression:               - Two 6 to 7 mm polyps in the sigmoid colon and in                            the cecum, removed with a cold snare. Resected and                            retrieved.                            - Mild diverticulosis in the left colon. There was                            no evidence of diverticular bleeding.                           - Small internal hemorrhoids. Recommendation:           - Repeat colonoscopy in 5 years for surveillance.                           - Resume Xarelto (rivaroxaban) tomorrow at prior                            dose. Refer to managing physician for further  adjustment of therapy.                           - Patient has a contact number available for                            emergencies. The signs and symptoms of potential                            delayed complications were discussed with the                            patient. Return to normal activities tomorrow.                            Written discharge instructions were provided to the                            patient.                           - High fiber diet.                           - Continue present medications.                           - Await pathology results.                           - No aspirin, ibuprofen, naproxen, or other                            non-steroidal anti-inflammatory drugs for 2 weeks                            after polyp removal. Ladene Artist, MD 12/23/2017 8:22:39 AM This report has been signed electronically.

## 2017-12-23 NOTE — Progress Notes (Signed)
A/ox3, pleased with MAC, report to RN 

## 2017-12-23 NOTE — Patient Instructions (Signed)
Impression/Recommendations:  Polyp handout given to patient. Hemorrhoid handout given to patient. Diverticulosis handout given to patient. High fiber diet handout given to patient.  Repeat colonoscopy in 5 years for surveillance.  Resume Xarelto tomorrow at prior dose.  Refer to managing physician for further adjustment of therapy.  Continue present medications.  No aspirin, ibuprofen, naproxen, or other NSAID drugs for 2 weeks.  Tylenol only until Jan 07, 2018.  YOU HAD AN ENDOSCOPIC PROCEDURE TODAY AT [facility]:   Refer to the procedure report that was given to you for any specific questions about what was found during the examination.  If the procedure report does not answer your questions, please call your gastroenterologist to clarify.  If you requested that your care partner not be given the details of your procedure findings, then the procedure report has been included in a sealed envelope for you to review at your convenience later.  YOU SHOULD EXPECT: Some feelings of bloating in the abdomen. Passage of more gas than usual.  Walking can help get rid of the air that was put into your GI tract during the procedure and reduce the bloating. If you had a lower endoscopy (such as a colonoscopy or flexible sigmoidoscopy) you may notice spotting of blood in your stool or on the toilet paper. If you underwent a bowel prep for your procedure, you may not have a normal bowel movement for a few days.  Please Note:  You might notice some irritation and congestion in your nose or some drainage.  This is from the oxygen used during your procedure.  There is no need for concern and it should clear up in a day or so.  SYMPTOMS TO REPORT IMMEDIATELY:   Following lower endoscopy (colonoscopy or flexible sigmoidoscopy):  Excessive amounts of blood in the stool  Significant tenderness or worsening of abdominal pains  Swelling of the abdomen that is new, acute  Fever of 100F or  higher For urgent or emergent issues, a gastroenterologist can be reached at any hour by calling [phone].   DIET:  We do recommend a small meal at first, but then you may proceed to your regular diet.  Drink plenty of fluids but you should avoid alcoholic beverages for 24 hours.  ACTIVITY:  You should plan to take it easy for the rest of today and you should NOT DRIVE or use heavy machinery until tomorrow (because of the sedation medicines used during the test).    FOLLOW UP: Our staff will call the number listed on your records the next business day following your procedure to check on you and address any questions or concerns that you may have regarding the information given to you following your procedure. If we do not reach you, we will leave a message.  However, if you are feeling well and you are not experiencing any problems, there is no need to return our call.  We will assume that you have returned to your regular daily activities without incident.  If any biopsies were taken you will be contacted by phone or by letter within the next 1-3 weeks.  Please call us at [phone] if you have not heard about the biopsies in 3 weeks.    SIGNATURES/CONFIDENTIALITY: You and/or your care partner have signed paperwork which will be entered into your electronic medical record.  These signatures attest to the fact that that the information above on your After Visit Summary has been reviewed and is understood.  Full responsibility of the confidentiality of this discharge information lies with you and/or your care-partner.

## 2017-12-24 ENCOUNTER — Telehealth: Payer: Self-pay | Admitting: *Deleted

## 2017-12-24 NOTE — Telephone Encounter (Signed)
  Follow up Call-  Call back number 12/23/2017  Post procedure Call Back phone  # [phone]  Permission to leave phone message Yes  Some recent data might be hidden     Patient questions:  Do you have a fever, pain , or abdominal swelling? No. Pain Score  0 *  Have you tolerated food without any problems? Yes.    Have you been able to return to your normal activities? Yes.    Do you have any questions about your discharge instructions: Diet   No. Medications  No. Follow up visit  No.  Do you have questions or concerns about your Care? No.  Actions: * If pain score is 4 or above: No action needed, pain <4.

## 2017-12-30 ENCOUNTER — Encounter: Payer: 59 | Admitting: Medical

## 2018-01-02 ENCOUNTER — Encounter: Payer: Self-pay | Admitting: Gastroenterology

## 2018-01-03 MED FILL — CLOBETASOL 0.05% SOLUTION: 0.05 | 30 days supply | Qty: 50 | Fill #0

## 2018-01-04 ENCOUNTER — Ambulatory Visit (HOSPITAL_BASED_OUTPATIENT_CLINIC_OR_DEPARTMENT_OTHER): Payer: 59 | Admitting: Pharmacist

## 2018-01-04 ENCOUNTER — Telehealth: Payer: Self-pay | Admitting: Pharmacist

## 2018-01-04 DIAGNOSIS — Z79899 Other long term (current) drug therapy: Secondary | ICD-10-CM

## 2018-01-04 MED ORDER — IXEKIZUMAB 80 MG/ML ~~LOC~~ SOAJ: 80.0000 mg | SUBCUTANEOUS | 5 refills | Status: DC

## 2018-01-04 NOTE — Telephone Encounter (Signed)
Called patient to schedule an appointment for the [facility]. I was unable to reach the patient so I left a HIPAA-compliant message requesting that the patient return my call.   

## 2018-01-04 NOTE — Progress Notes (Signed)
S: Patient presents today for review of his specialty medication.  Patient is currently taking Taltz for psoriatric arthritis. Patient is managed by Dr. Elvera Lennox for this. Has failed Stelara and Otezla.  Adherence: denies any missed doses.  Efficacy: still feels like it is working very well - everything has cleared up except for one spot. He has a hot spot on his knee but he is able to control that with clobetasol. He as been on Taltz since the original trial.   Dosing:  SubQ:  80 mg every 4 weeks.  Drug-drug interactions: none  Screening: TB test: completed yearly, denies any positive test results Hepatitis: completed  Monitoring: S/sx of infection: denies CBC: see below S/sx of hypersensitivity: denies S/sx of malignancy: denies S/sx of heart failure: denies   O:     Lab Results  Component Value Date   WBC 7.4 10/18/2017   HGB 17.6 10/18/2017   HCT 50.8 10/18/2017   MCV 91 10/18/2017   PLT 168 10/18/2017      Chemistry      Component Value Date/Time   NA 139 10/18/2017 1004   NA 138 05/22/2015 1303   K 4.3 10/18/2017 1004   K 4.1 05/22/2015 1303   CL 98 10/18/2017 1004   CO2 22 10/18/2017 1004   CO2 23 05/22/2015 1303   BUN 18 10/18/2017 1004   BUN 17.4 05/22/2015 1303   CREATININE 1.15 10/18/2017 1004   CREATININE 1.21 11/24/2016 0946   CREATININE 1.1 05/22/2015 1303      Component Value Date/Time   CALCIUM 9.4 10/18/2017 1004   CALCIUM 9.4 05/22/2015 1303   ALKPHOS 54 10/18/2017 1004   ALKPHOS 73 05/22/2015 1303   AST 27 10/18/2017 1004   AST 24 05/22/2015 1303   ALT 31 10/18/2017 1004   ALT 30 05/22/2015 1303   BILITOT 1.1 10/18/2017 1004   BILITOT 0.77 05/22/2015 1303       A/P: 1. Medication review: patient is on Taltz and tolerating it well without any adverse effects. Reviewed the medication, including injection technique, adverse effects, and efficacy. No recommendations for changes. Patient to follow up with Dr. Elvera Lennox as  directed.   Christella Hartigan, PharmD, BCPS, BCACP, CPP Clinical Pharmacist Practitioner  [phone]

## 2018-01-06 ENCOUNTER — Ambulatory Visit (INDEPENDENT_AMBULATORY_CARE_PROVIDER_SITE_OTHER): Payer: Self-pay | Admitting: Medical

## 2018-01-06 VITALS — BP 132/82 | HR 74 | Ht 69.0 in | Wt 284.8 lb

## 2018-01-06 DIAGNOSIS — E1169 Type 2 diabetes mellitus with other specified complication: Secondary | ICD-10-CM

## 2018-01-06 DIAGNOSIS — Z0289 Encounter for other administrative examinations: Secondary | ICD-10-CM

## 2018-01-06 DIAGNOSIS — E1159 Type 2 diabetes mellitus with other circulatory complications: Secondary | ICD-10-CM

## 2018-01-06 DIAGNOSIS — E669 Obesity, unspecified: Secondary | ICD-10-CM

## 2018-01-06 DIAGNOSIS — I152 Hypertension secondary to endocrine disorders: Secondary | ICD-10-CM

## 2018-01-06 DIAGNOSIS — I482 Chronic atrial fibrillation: Secondary | ICD-10-CM

## 2018-01-06 DIAGNOSIS — I4821 Permanent atrial fibrillation: Secondary | ICD-10-CM

## 2018-01-06 DIAGNOSIS — E1121 Type 2 diabetes mellitus with diabetic nephropathy: Secondary | ICD-10-CM

## 2018-01-06 DIAGNOSIS — I10 Essential (primary) hypertension: Secondary | ICD-10-CM

## 2018-01-06 DIAGNOSIS — G4733 Obstructive sleep apnea (adult) (pediatric): Secondary | ICD-10-CM

## 2018-01-06 DIAGNOSIS — E785 Hyperlipidemia, unspecified: Secondary | ICD-10-CM

## 2018-01-06 LAB — POCT URINALYSIS DIP (PROADVANTAGE DEVICE)
Bilirubin, UA: NEGATIVE
Glucose, UA: NEGATIVE mg/dL
Ketones, POC UA: NEGATIVE mg/dL
Leukocytes, UA: NEGATIVE
Nitrite, UA: NEGATIVE
pH, UA: 6 (ref 5.0–8.0)

## 2018-01-06 MED FILL — TALTZ 80 MG/ML AUTOINJECTOR: 80 | 28 days supply | Qty: 1 | Fill #0

## 2018-01-06 NOTE — Progress Notes (Signed)
William [place] D Senn Jr. is a 57 y.o. male who presents today for a commercial driver fitness determination physical exam.  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   Last DOT physical 2018 here with me.  Medical care team includes:  Dr. Redmond School here for primary care since 1976.  Review of Systems A comprehensive review of systems was reviewed and noted as below:  Eye: +uses reading glasses, but otherwise no corrective lenses, -lasik surgery or other eye surgery, -glaucoma, -cataracts, -macular degeneration, -monocular vision, -medication for eye condition, -blurred vision,   Ears: -hearing problems, - hearing aids, -ear pain, -ear drainage, -ear fullness, -tinnitus, -recurrent ear infection, -previous ear surgery, - vertigo, -meniere's disease  Endocrine: -polydipsia, -polyuria, -intentional weight loss, -fainting, -dizziness, - altered or loss of consciousness, -hypoglycemia  Cardiovascular: +heart disease, -CHF, -heart attack, -cardiac stents, -bypass surgery, +other heart surgery/prior procedure for PFO, +hypertension, -blood clots, -pacemaker, -medications for heart condition, -chest pain, -SOB, -palpitations, -fainting, -dizziness, -dyspnea  Respiratory: -asthma, -COPD, other lung disease, -smoker, -chest tightness, - wheezing, -snoring, -daytime sleepiness, +sleep apnea/uses CPAP, -narcolepsy  Allergy: -uncontrollable sneezing or allergy symptoms  Musculoskeletal: -missing body parts, -muscle disease, -bone disease, -spine injury, -low back pain, -medication for joints, bones, muscles or pain, -physical limitations, -joint pain, -neck pain, -limitations of neck ROM, -back surgery, +left hip replacement,  +hx/o psoriasis, -gout  Neurologic: -neurologic disease, -dementia, -seizures, -parkinson's, -tremor, -memory problems,  -weakness, -numbness, -tingling, -medication for neurologic condition, -medications for sleep condition  Gastric: -abdominal pain, -chronic diarrhea or IBS, -uncontrollable nausea  Kidney/Renal: -hematuria, -dialysis, kidney disease, polycystic kidney disease  Psychiatric: -homicidal thoughts, -suicidal thoughts, -prior suicide attempts, -get into fights/hurting others, -memory or concentration problems, -delusions, -hallucinations, -hospitalization for mental health problem, -depression, -anxiety, -bipolar  Drug use: - none  Checks glucose fasting daily, sees 135-165 range regularly, recent HgbA1C 6.1%.   Compliant with medications   Reviewed their medical, surgical, family, social, medication, and allergy history and updated chart as appropriate.     Objective:   Physical Exam  BP 132/82   Pulse 74   Ht 5\' 9"  (1.753 m)   Wt 284 lb 12.8 oz (129.2 kg)   SpO2 97%   BMI 42.06 kg/m   Wt Readings from Last 3 Encounters:  01/06/18 284 lb 12.8 oz (129.2 kg)  12/23/17 293 lb (132.9 kg)  11/02/17 293 lb (132.9 kg)   Wt Readings from Last 3 Encounters:  01/06/18 284 lb 12.8 oz (129.2 kg)  12/23/17 293 lb (132.9 kg)  11/02/17 293 lb (132.9 kg)   General appearance: alert, no distress, WD/WN, obese white male Skin: no worrisome findings otherwise  HEENT: normocephalic, conjunctiva/corneas normal, sclerae anicteric, PERRLA, EOMi, nares patent, no discharge or erythema, pharynx normal Oral cavity: MMM, tongue normal, teeth in good repair Neck: supple, no lymphadenopathy, no thyromegaly, no masses, normal ROM, no bruits Chest: non tender, normal shape and expansion Heart: RRR, normal S1, S2, no murmurs Lungs: CTA bilaterally, no wheezes, rhonchi, or rales Abdomen: +bs, soft, obese, non tender, non distended, no masses, no hepatomegaly, no splenomegaly, no bruits Back: non tender, normal ROM, no scoliosis Musculoskeletal: left buttock/posterior hip surgical scar, left forearm dorsal  with diagonal long scar from childhood trauma, otherwise upper extremities non tender, no obvious deformity, normal ROM throughout, lower extremities non tender, no obvious deformity, normal ROM throughout Extremities: no  edema, no cyanosis, no clubbing Pulses: 2+ symmetric, upper and lower extremities, normal cap refill Neurological: alert, oriented x 3, CN2-12 intact, strength normal upper extremities and lower extremities, sensation normal throughout, DTRs 2+ throughout, no cerebellar signs, gait normal Psychiatric: normal affect, behavior normal, pleasant  GU: no hernia Rectal: deferred  Vision, hearing, urinalysis reviewed.    Assessment:   Encounter Diagnoses  Name Primary?  . Health examination of defined subpopulation Yes  . Hypertension associated with diabetes ([place])   . Permanent atrial fibrillation ([place])   . Obstructive sleep apnea   . Type 2 diabetes mellitus with diabetic nephropathy, without long-term current use of insulin ([place])   . Hyperlipidemia associated with type 2 diabetes mellitus ([place])   . Obesity (BMI 30-39.9)     Plan:   DOT evaluation - Forms completed for DOT good for 1 year.  Hypertension - c/t same medication, regular f/u with PCP and cardiology  Diabetes type 2-reviewed recent labs including recent hemoglobin A1c. He is compliant with medication, routine f/u  History of TIA with repair of PFO and no problems since.   OSA on CPAP -  He reports very good compliance   Reviewed 10/2016 echocardiogram and last cardiology notes from  10/04/17.   C/t current medicaitons and routine f/u with cardiology and PCP.   Of note, eye exam reviewed from Dr. Jola Schmidt  Discussed symptoms that would prompt urgent evaluation  F/u with Dr. Redmond School regarding proteinuria

## 2018-01-25 MED FILL — CARTIA XT 240 MG CAPSULE: 240 | 90 days supply | Qty: 90 | Fill #1

## 2018-01-25 MED FILL — BENAZEPRIL HCL 40 MG TABLET: 40 | 90 days supply | Qty: 90 | Fill #1

## 2018-01-25 MED FILL — XARELTO 20 MG TABLET: 20 | 30 days supply | Qty: 30 | Fill #3

## 2018-02-01 MED FILL — METFORMIN HCL ER 500 MG TAB: 500 | 90 days supply | Qty: 180 | Fill #1

## 2018-02-10 MED FILL — TALTZ 80 MG/ML AUTOINJECTOR: 80 | 28 days supply | Qty: 1 | Fill #1

## 2018-02-21 MED FILL — PANTOPRAZOLE SOD DR 40 MG T: 40 | 90 days supply | Qty: 45 | Fill #1

## 2018-02-21 MED FILL — XARELTO 20 MG TABLET: 20 | 30 days supply | Qty: 30 | Fill #4

## 2018-02-23 DIAGNOSIS — G4733 Obstructive sleep apnea (adult) (pediatric): Secondary | ICD-10-CM | POA: Diagnosis not present

## 2018-03-02 ENCOUNTER — Encounter (HOSPITAL_COMMUNITY): Payer: Self-pay | Admitting: Emergency Medicine

## 2018-03-02 ENCOUNTER — Other Ambulatory Visit: Payer: Self-pay

## 2018-03-02 ENCOUNTER — Ambulatory Visit (HOSPITAL_COMMUNITY)
Admission: EM | Admit: 2018-03-02 | Discharge: 2018-03-02 | Disposition: A | Discharge status: Home / Self Care | Payer: 59 | Attending: Internal Medicine | Admitting: Internal Medicine

## 2018-03-02 DIAGNOSIS — M25561 Pain in right knee: Secondary | ICD-10-CM | POA: Diagnosis not present

## 2018-03-02 DIAGNOSIS — S8001XA Contusion of right knee, initial encounter: Secondary | ICD-10-CM

## 2018-03-02 MED ORDER — OXYCODONE-ACETAMINOPHEN 5-325 MG PO TABS: 1.0000 | ORAL_TABLET | ORAL | 0 refills | Status: DC | PRN
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
## 2018-03-05 ENCOUNTER — Ambulatory Visit (HOSPITAL_COMMUNITY)
Admission: EM | Admit: 2018-03-05 | Discharge: 2018-03-05 | Disposition: A | Discharge status: Home / Self Care | Payer: 59 | Attending: Family Medicine | Admitting: Family Medicine

## 2018-03-05 ENCOUNTER — Ambulatory Visit (INDEPENDENT_AMBULATORY_CARE_PROVIDER_SITE_OTHER): Payer: 59

## 2018-03-05 ENCOUNTER — Encounter (HOSPITAL_COMMUNITY): Payer: Self-pay | Admitting: Emergency Medicine

## 2018-03-05 DIAGNOSIS — M25561 Pain in right knee: Secondary | ICD-10-CM

## 2018-03-05 MED ORDER — PREDNISONE 20 MG PO TABS: 40.0000 mg | ORAL_TABLET | Freq: Every day | ORAL | 0 refills | Status: AC

## 2018-03-05 MED ORDER — OXYCODONE-ACETAMINOPHEN 5-325 MG PO TABS: 2.0000 | ORAL_TABLET | Freq: Four times a day (QID) | ORAL | 0 refills | Status: DC | PRN

## 2018-03-05 NOTE — Discharge Instructions (Signed)
Ice/cold pack over area for 10-15 min twice daily.  If no improvement, follow up with Dr. Redmond School.
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
## 2018-03-07 ENCOUNTER — Ambulatory Visit: Payer: 59 | Admitting: Family Medicine

## 2018-03-07 ENCOUNTER — Other Ambulatory Visit: Payer: Self-pay | Admitting: Family Medicine

## 2018-03-07 VITALS — BP 142/82 | HR 52 | Temp 98.1°F | Wt 289.6 lb

## 2018-03-07 DIAGNOSIS — M25461 Effusion, right knee: Secondary | ICD-10-CM

## 2018-03-07 DIAGNOSIS — R29898 Other symptoms and signs involving the musculoskeletal system: Secondary | ICD-10-CM

## 2018-03-07 DIAGNOSIS — S8001XA Contusion of right knee, initial encounter: Secondary | ICD-10-CM

## 2018-03-07 DIAGNOSIS — R6889 Other general symptoms and signs: Secondary | ICD-10-CM

## 2018-03-07 MED ORDER — HYDROCODONE-ACETAMINOPHEN 10-325 MG PO TABS
1.0000 | ORAL_TABLET | Freq: Three times a day (TID) | ORAL | 0 refills | Status: DC | PRN
Start: 2018-03-07 — End: 2018-05-06

## 2018-03-07 MED FILL — ACCU-CHEK FASTCLIX LANCETS: 51 days supply | Qty: 102 | Fill #0

## 2018-03-07 MED FILL — ACCU-CHEK GUIDE STRP: 50 days supply | Qty: 100 | Fill #1

## 2018-03-07 MED FILL — HYDROCODON-APAP 10-325: 10-325 | 4 days supply | Qty: 30 | Fill #0

## 2018-03-07 MED FILL — TALTZ 80 MG/ML AUTOINJECTOR: 80 | 28 days supply | Qty: 1 | Fill #2
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
## 2018-03-07 NOTE — Patient Instructions (Signed)
Take 2 Aleve twice per day.  Heat for 20 minutes 3 times per day

## 2018-03-21 ENCOUNTER — Encounter: Payer: Self-pay | Admitting: Family Medicine

## 2018-03-21 ENCOUNTER — Ambulatory Visit: Payer: 59 | Admitting: Family Medicine

## 2018-03-21 ENCOUNTER — Telehealth: Payer: Self-pay

## 2018-03-21 VITALS — BP 126/82 | HR 84 | Temp 97.8°F | Wt 284.6 lb

## 2018-03-21 DIAGNOSIS — M25461 Effusion, right knee: Secondary | ICD-10-CM | POA: Diagnosis not present

## 2018-03-21 DIAGNOSIS — E1121 Type 2 diabetes mellitus with diabetic nephropathy: Secondary | ICD-10-CM

## 2018-03-21 DIAGNOSIS — E669 Obesity, unspecified: Secondary | ICD-10-CM | POA: Diagnosis not present

## 2018-03-21 DIAGNOSIS — R6889 Other general symptoms and signs: Secondary | ICD-10-CM | POA: Diagnosis not present

## 2018-03-21 DIAGNOSIS — G8929 Other chronic pain: Secondary | ICD-10-CM

## 2018-03-21 DIAGNOSIS — R29898 Other symptoms and signs involving the musculoskeletal system: Secondary | ICD-10-CM

## 2018-03-21 DIAGNOSIS — M25561 Pain in right knee: Secondary | ICD-10-CM

## 2018-03-21 LAB — POCT GLYCOSYLATED HEMOGLOBIN (HGB A1C): HEMOGLOBIN A1C: 6.7 % — AB (ref 4.0–5.6)

## 2018-03-21 MED ORDER — SEMAGLUTIDE(0.25 OR 0.5MG/DOS) 2 MG/1.5ML ~~LOC~~ SOPN: 0.2500 [IU] | PEN_INJECTOR | SUBCUTANEOUS | 5 refills | Status: DC

## 2018-03-21 MED FILL — XARELTO 20 MG TABLET: 20 | 30 days supply | Qty: 30 | Fill #5

## 2018-03-21 MED FILL — ROSUVASTATIN CALCIUM 10 MG: 10 | 90 days supply | Qty: 90 | Fill #2
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
## 2018-03-21 NOTE — Telephone Encounter (Signed)
Pt was called to advise that his ortho doctor is booked out until October and to check to see if he would not mind seeing one of the PA at [place]

## 2018-03-21 NOTE — Addendum Note (Signed)
Addended by: Elyse Jarvis on: 03/21/2018 09:28 AM   Modules accepted: Orders

## 2018-03-22 ENCOUNTER — Telehealth: Payer: Self-pay

## 2018-03-22 ENCOUNTER — Other Ambulatory Visit: Payer: Self-pay | Admitting: Family Medicine

## 2018-03-22 ENCOUNTER — Other Ambulatory Visit: Payer: Self-pay

## 2018-03-22 DIAGNOSIS — G8929 Other chronic pain: Secondary | ICD-10-CM

## 2018-03-22 DIAGNOSIS — M25561 Pain in right knee: Principal | ICD-10-CM

## 2018-03-22 DIAGNOSIS — E1121 Type 2 diabetes mellitus with diabetic nephropathy: Secondary | ICD-10-CM

## 2018-03-22 MED ORDER — DULAGLUTIDE 0.75 MG/0.5ML ~~LOC~~ SOAJ: 1.0000 | SUBCUTANEOUS | 5 refills | Status: DC

## 2018-03-22 MED FILL — TRULICITY 0.75 MG/0.5 ML PE: 0.75 | 28 days supply | Qty: 2 | Fill #0

## 2018-03-22 NOTE — Telephone Encounter (Signed)
Pt was called to advise of MRI appt sat at [facility] . Pt should arrive at 12:45 for a 1 pm appt. LVM of info. [place]

## 2018-03-22 NOTE — Addendum Note (Signed)
Addended by: Denita Lung on: 03/22/2018 07:56 AM   Modules accepted: Orders

## 2018-03-26 ENCOUNTER — Telehealth: Payer: Self-pay | Admitting: Family Medicine

## 2018-03-26 ENCOUNTER — Ambulatory Visit (HOSPITAL_COMMUNITY)
Admission: RE | Admit: 2018-03-26 | Discharge: 2018-03-26 | Disposition: A | Discharge status: Home / Self Care | Payer: 59 | Source: Ambulatory Visit | Attending: Family Medicine | Admitting: Family Medicine

## 2018-03-26 DIAGNOSIS — G8929 Other chronic pain: Secondary | ICD-10-CM | POA: Insufficient documentation

## 2018-03-26 DIAGNOSIS — M659 Synovitis and tenosynovitis, unspecified: Secondary | ICD-10-CM | POA: Insufficient documentation

## 2018-03-26 DIAGNOSIS — M25561 Pain in right knee: Secondary | ICD-10-CM | POA: Insufficient documentation

## 2018-03-26 DIAGNOSIS — X58XXXA Exposure to other specified factors, initial encounter: Secondary | ICD-10-CM | POA: Insufficient documentation

## 2018-03-26 DIAGNOSIS — M25461 Effusion, right knee: Secondary | ICD-10-CM | POA: Insufficient documentation

## 2018-03-26 DIAGNOSIS — S83241A Other tear of medial meniscus, current injury, right knee, initial encounter: Secondary | ICD-10-CM | POA: Insufficient documentation

## 2018-03-26 DIAGNOSIS — R6889 Other general symptoms and signs: Secondary | ICD-10-CM | POA: Insufficient documentation

## 2018-03-26 DIAGNOSIS — M1711 Unilateral primary osteoarthritis, right knee: Secondary | ICD-10-CM | POA: Diagnosis not present

## 2018-03-26 DIAGNOSIS — R29898 Other symptoms and signs involving the musculoskeletal system: Secondary | ICD-10-CM

## 2018-03-26 NOTE — Telephone Encounter (Signed)
P.A. OZEMPIC 

## 2018-03-31 ENCOUNTER — Telehealth: Payer: Self-pay | Admitting: Family Medicine

## 2018-03-31 NOTE — Telephone Encounter (Signed)
OK. Thanks [place]man

## 2018-03-31 NOTE — Telephone Encounter (Signed)
P.A. Approved for 1 pen a month which is correct for the dosage of .25 per week, called pharmacy & went thru for $24.99.  Called pt and he states Dr. Redmond School has switched him to Trulicity & doing fine on it and $0 co pay,  Will stay with that for now and let use know if there are any issues or if he wants to switch back.

## 2018-03-31 NOTE — Telephone Encounter (Signed)
I called pt about his P.A. & he asked about referral he wanted appt with Dr. Maureen Ralphs or Dr. Adriana Mccallum.  I called [facility]  t# [phone] & Dr. Maureen Ralphs is booked until October and so I switched appt to Dr. Alvan Dame 8/16 pt to arrive 7:45.   Pt informed.

## 2018-04-15 DIAGNOSIS — M25562 Pain in left knee: Secondary | ICD-10-CM | POA: Diagnosis not present

## 2018-04-15 DIAGNOSIS — M1711 Unilateral primary osteoarthritis, right knee: Secondary | ICD-10-CM | POA: Diagnosis not present

## 2018-04-15 DIAGNOSIS — M1712 Unilateral primary osteoarthritis, left knee: Secondary | ICD-10-CM | POA: Diagnosis not present

## 2018-04-15 DIAGNOSIS — M25561 Pain in right knee: Secondary | ICD-10-CM | POA: Diagnosis not present

## 2018-04-15 MED FILL — TRULICITY 0.75 MG/0.5 ML PE: 0.75 | 28 days supply | Qty: 2 | Fill #1

## 2018-04-18 ENCOUNTER — Other Ambulatory Visit: Payer: Self-pay | Admitting: Cardiovascular Disease

## 2018-04-18 ENCOUNTER — Telehealth: Payer: Self-pay

## 2018-04-18 ENCOUNTER — Ambulatory Visit: Payer: 59 | Admitting: Family Medicine

## 2018-04-18 MED FILL — XARELTO 20 MG TABLET: 20 | 90 days supply | Qty: 90 | Fill #0

## 2018-04-18 MED FILL — CARTIA XT 240 MG CAPSULE: 240 | 90 days supply | Qty: 90 | Fill #2

## 2018-04-18 NOTE — Telephone Encounter (Signed)
   [facility] Pre-operative Risk Assessment    Request for surgical clearance:  1. What type of surgery is being performed?           RTKA is in September and LTKA in        October 1. When is this surgery scheduled?  05/24/18 and 06/28/18   2. What type of clearance is required (medical clearance vs. Pharmacy clearance to hold med vs. Both)? Both  3. Are there any medications that need to be held prior to surgery and how long? How long can it be held   4. Practice name and name of physician performing surgery? [facility] Dr. Alvan Dame   5. What is your office phone number [phone]    7.   What is your office fax number        [phone] [place]  8.   Anesthesia type (None, local, MAC, general) ? Spinal   Ewell Poe [place] 04/18/2018, 2:49 PM  _________________________________________________________________   (provider comments below)

## 2018-04-19 NOTE — Telephone Encounter (Signed)
   [facility]  (Has recall with Marinus Maw, PA for 03/2018) Chart reviewed as part of pre-operative protocol coverage. Because of JEYDAN BARNER Jr.'s past medical history and time since last visit, he/she will require a follow-up visit in order to better assess preoperative cardiovascular risk.  Pre-op covering staff: - Please schedule appointment and call patient to inform them. - Please contact requesting surgeon's office via preferred method (i.e, phone, fax) to inform them of need for appointment prior to surgery.  Truitt Merle, NP  04/19/2018, 2:49 PM

## 2018-04-19 NOTE — Telephone Encounter (Signed)
Pt was made appointment for surgical clearance.

## 2018-05-03 MED FILL — METFORMIN HCL ER 500 MG TAB: 500 | 90 days supply | Qty: 180 | Fill #0

## 2018-05-03 MED FILL — BENAZEPRIL HCL 40 MG TABLET: 40 | 90 days supply | Qty: 90 | Fill #2

## 2018-05-04 ENCOUNTER — Other Ambulatory Visit: Payer: Self-pay | Admitting: Family Medicine

## 2018-05-04 MED FILL — PANTOPRAZOLE SOD DR 40 MG T: 40 | 90 days supply | Qty: 45 | Fill #2

## 2018-05-05 ENCOUNTER — Encounter: Payer: Self-pay | Admitting: Physician Assistant

## 2018-05-05 MED FILL — ACCU-CHEK GUIDE STRP: 50 days supply | Qty: 100 | Fill #2

## 2018-05-05 MED FILL — ACCU-CHEK FASTCLIX LANCETS: 51 days supply | Qty: 102 | Fill #1

## 2018-05-05 MED FILL — ULTICARE ALCOHOL SWABS PADS: 50 days supply | Qty: 100 | Fill #0

## 2018-05-05 NOTE — Progress Notes (Signed)
Cardiology Office Note Date:  05/06/2018  Patient ID:  William Troop., DOB 1961-08-12, MRN [id number] PCP:  Denita Lung, MD  Cardiologist:  Dr. Gwenlyn Found Electrophysiologist; Dr. Rayann Heman    Chief Complaint: 6 month EP f/u, upcoming knee surgeries   History of Present Illness: William Janczak. is a 57 y.o. male with history of TIA, PAFib, HTN, morbid obesity, OSA w/CPAP, DM, and PFO closure, LV dysfunction.    He comes in today to be seen for Dr. Rayann Heman.  He last saw him in Feb 2019, he was asymptomatic with his AF that was rate controlled, the ASA stopped given his Eliquis, no other changes.    Outside of his knee pain he is doing quite well.  Despite his knees, remains active and denies any exertional intolerances.  No CP, palpitations, no SOB or DOE.  No dizziness, near syncope or syncope.  He sleeps with his CPAP, denies symptoms of PND or orthopnea.  No bleeding or signs of bleeding  RCRI score is 0.9 DUKE score 7.99 METS   Past Medical History:  Diagnosis Date  . Arthritis   . Diverticulosis   . Dyslipidemia   . GERD (gastroesophageal reflux disease)   . Herpes labialis   . Hypertension   . Longstanding persistent atrial fibrillation ([place])   . Metabolic syndrome   . Obesity   . Proteinuria   . Psoriasis   . Seborrheic dermatitis   . Sleep apnea    very compliant with CPAP  . TIA (transient ischemic attack) 2009    Past Surgical History:  Procedure Laterality Date  . CARDIOVERSION N/A 12/02/2016   Procedure: CARDIOVERSION;  Surgeon: Pixie Casino, MD;  Location: [facility] ENDOSCOPY;  Service: Cardiovascular;  Laterality: N/A;  . COLONOSCOPY    . JOINT REPLACEMENT  11/03/10   LT HIP  . TONSILLECTOMY AND ADENOIDECTOMY    . TOTAL HIP ARTHROPLASTY Left   . WISDOM TOOTH EXTRACTION      Current Outpatient Medications  Medication Sig Dispense Refill  . ACCU-CHEK FASTCLIX LANCETS MISC CHECK BLOOD SUGAR TWICE A DAY 102 each 4  . ACCU-CHEK GUIDE test strip TEST BLOOD  SUGAR TWICE A DAY 200 each 12  . benazepril (LOTENSIN) 40 MG tablet Take 1 tablet (40 mg total) by mouth daily. 90 tablet 3  . clobetasol (TEMOVATE) 0.05 % external solution APPLY TO AFFECTED AREAS TWICE DAILY AS NEEDED FOR FLAREUPS.  0  . diltiazem (CARTIA XT) 240 MG 24 hr capsule Take 1 capsule (240 mg total) by mouth daily. 90 capsule 3  . Dulaglutide (TRULICITY) 4.78 GN/5.6OZ SOPN Inject 1 applicator into the skin once a week. 4 pen 5  . Ixekizumab (TALTZ) 80 MG/ML SOAJ Inject 80 mg into the skin every 30 (thirty) days. 1 mL 5  . metFORMIN (GLUCOPHAGE-XR) 500 MG 24 hr tablet TAKE ONE TABLET BY MOUTH TWICE DAILY 180 tablet 1  . pantoprazole (PROTONIX) 40 MG tablet Take 1 tablet (40 mg total) by mouth every other day. 90 tablet 3  . rosuvastatin (CRESTOR) 10 MG tablet Take 1 tablet (10 mg total) by mouth daily. 90 tablet 3  . ULTICARE ALCOHOL SWABS 70 % PADS APPLY 2 TIMES DAILY. 120 each 3  . Vitamin D, Cholecalciferol, 1000 units CAPS Take 1 capsule by mouth daily.    [name] 20 MG TABS tablet TAKE 1 TABLET BY MOUTH DAILY WITH SUPPER. 90 tablet 1   Current Facility-Administered Medications  Medication Dose Route Frequency Provider Last  Rate Last Dose  . 0.9 %  sodium chloride infusion  500 mL Intravenous Once Ladene Artist, MD        Allergies:   Patient has no known allergies.   Social History:  The patient  reports that he has never smoked. He has never used smokeless tobacco. He reports that he drinks alcohol. He reports that he does not use drugs.   Family History:  The patient's family history includes Cancer in his other; Crohn's disease in his mother; Esophageal cancer in his father; Obesity in his other; Uterine cancer (age of onset: 60) in his mother.  ROS:  Please see the history of present illness.  All other systems are reviewed and otherwise negative.   PHYSICAL EXAM:  VS:  BP 126/72   Pulse 80   Ht 5\' 9"  (1.753 m)   Wt 276 lb (125.2 kg)   BMI 40.76 kg/m  BMI:  Body mass index is 40.76 kg/m. Well nourished, well developed, in no acute distress  HEENT: normocephalic, atraumatic  Neck: no JVD, carotid bruits or masses Cardiac:  iRRR; no significant murmurs, no rubs, or gallops Lungs:  CTA b/l, no wheezing, rhonchi or rales  Abd: soft, nontender MS: no deformity or atrophy Ext: no edema Skin: warm and dry, + psoriasis noted LE particularly Neuro:  No gross deficits appreciated Psych: euthymic mood, full affect   EKG:  Done today and reviewed by myself AFib 80bpm  10/23/16; TTE Study Conclusions - Left ventricle: The cavity size was normal. Wall thickness was   increased in a pattern of mild LVH. Systolic function was mildly   to moderately reduced. The estimated ejection fraction was in the   range of 40% to 45%. Diffuse hypokinesis. - Ascending aorta: The ascending aorta was mildly dilated. - Left atrium: The atrium was moderately dilated. - Right atrium: The atrium was mildly dilated. Impressions: - Technically difficult; definity used; pt in atrial fibrillation   during the study; mild to moderate global reduction in LV   function; mild LVH; biatrial enlargement; PFO closure device with   no residual shunt    Recent Labs: 10/18/2017: ALT 31; BUN 18; Creatinine, Ser 1.15; Hemoglobin 17.6; Platelets 168; Potassium 4.3; Sodium 139  10/18/2017: Chol/HDL Ratio 3.0; Cholesterol, Total 108; HDL 36; LDL Calculated 27; Triglycerides 225   CrCl cannot be calculated (Patient's most recent lab result is older than the maximum 21 days allowed.).   Wt Readings from Last 3 Encounters:  05/06/18 276 lb (125.2 kg)  03/21/18 284 lb 9.6 oz (129.1 kg)  03/07/18 289 lb 9.6 oz (131.4 kg)     Other studies reviewed: Additional studies/records reviewed today include: summarized above  ASSESSMENT AND PLAN:  1. Longstanding persistent AFib     CHA2DS2Vasc is 3, on Xarelto, appropriately dosed for Feb labs     Will update BMET/CBC for his  xarelto  He has hx of PFO closure, PAF and a TIA. In d/w the patient, PFO closure was done post TIA, though unclear timing of AF finding.  Given hx of of TIA, I recommend lovenox bridging peri-operatively.  2. NICM     no symptoms or exam findings to suggest CHF     Weight is down     no known hx of CHF exacerbations      He is due for updated echo  3. HTN     Looks OK  4. Upcoming knee surgeries     Good [facility] METS score  He  has hx of CM, due for an echo, I anticipate if echo looks same or better he will be OK to proceed with his planned orthopedic surgery.  As noted above, I think he should be bridged with lovenox.  He sees ortho next week, will know duration they would like him off xarelto.  Will defer to our pharmacist to manage his anticoagulation pre-operatively.    Disposition: F/u with 6 months, sooner if needed  Current medicines are reviewed at length with the patient today.  The patient did not have any concerns regarding medicines.  Venetia Night, PA-C 05/06/2018 8:46 AM     [place] [address] [place] [address] [phone] (office)  [phone] (fax)

## 2018-05-06 ENCOUNTER — Telehealth: Payer: Self-pay | Admitting: Pharmacist

## 2018-05-06 ENCOUNTER — Ambulatory Visit: Payer: 59 | Admitting: Physician Assistant

## 2018-05-06 VITALS — BP 126/72 | HR 80 | Ht 69.0 in | Wt 276.0 lb

## 2018-05-06 DIAGNOSIS — I10 Essential (primary) hypertension: Secondary | ICD-10-CM

## 2018-05-06 DIAGNOSIS — Z5181 Encounter for therapeutic drug level monitoring: Secondary | ICD-10-CM

## 2018-05-06 DIAGNOSIS — Z01818 Encounter for other preprocedural examination: Secondary | ICD-10-CM

## 2018-05-06 DIAGNOSIS — I482 Chronic atrial fibrillation: Secondary | ICD-10-CM

## 2018-05-06 DIAGNOSIS — I429 Cardiomyopathy, unspecified: Secondary | ICD-10-CM | POA: Diagnosis not present

## 2018-05-06 DIAGNOSIS — I4821 Permanent atrial fibrillation: Secondary | ICD-10-CM

## 2018-05-06 NOTE — Patient Instructions (Addendum)
Medication Instructions:   Your physician has requested that you have an echocardiogram. Echocardiography is a painless test that uses sound waves to create images of your heart. It provides your doctor with information about the size and shape of your heart and how well your heart's chambers and valves are working. This procedure takes approximately one hour. There are no restrictions for this procedure.    If you need a refill on your cardiac medications before your next appointment, please call your pharmacy.  Labwork: CBC AND BMET    Testing/Procedures: ASAP PRE OP CLEARANCE Your physician has requested that you have an echocardiogram. Echocardiography is a painless test that uses sound waves to create images of your heart. It provides your doctor with information about the size and shape of your heart and how well your heart's chambers and valves are working. This procedure takes approximately one hour. There are no restrictions for this procedure.     Follow-Up:  Your physician wants you to follow-up in:  [place]  [place] will receive a reminder letter in the mail two months in advance. If you don't receive a letter, please call our office to schedule the follow-up appointment.     Any Other Special Instructions Will Be Listed Below (If Applicable).

## 2018-05-06 NOTE — Telephone Encounter (Signed)
-----   Message from [facility], [place] sent at 05/06/2018  8:58 AM EDT ----- Good morning!  I saw this patient today for pre-op knee surgery He is due for an echo anyway and if OK I will clear, he has hx of persistent AFib and PFO closure with TIA hx.  I am not sure when AFib became part of his story, TIA was pre-closure, but I think with a neuro event hx and AF, he should be bridged.  He sees ortho next week for final pre-op stuff from them.  Would you guys please manage his a/c  Thanks!!!  renee

## 2018-05-06 NOTE — Telephone Encounter (Signed)
Will set up appt for bridging with lovenox. Spoke with pt will go over bridging instructions after ECHO on Monday.

## 2018-05-07 LAB — CBC
HEMATOCRIT: 50.3 % (ref 37.5–51.0)
HEMOGLOBIN: 18.5 g/dL — AB (ref 13.0–17.7)
MCH: 32.8 pg (ref 26.6–33.0)
MCHC: 36.8 g/dL — ABNORMAL HIGH (ref 31.5–35.7)
MCV: 89 fL (ref 79–97)
Platelets: 215 10*3/uL (ref 150–450)
RBC: 5.64 x10E6/uL (ref 4.14–5.80)
RDW: 13.8 % (ref 12.3–15.4)
WBC: 8.4 10*3/uL (ref 3.4–10.8)

## 2018-05-07 LAB — BASIC METABOLIC PANEL
BUN/Creatinine Ratio: 14 (ref 9–20)
BUN: 18 mg/dL (ref 6–24)
CO2: 19 mmol/L — AB (ref 20–29)
CREATININE: 1.25 mg/dL (ref 0.76–1.27)
Calcium: 10.3 mg/dL — ABNORMAL HIGH (ref 8.7–10.2)
Chloride: 101 mmol/L (ref 96–106)
GFR calc Af Amer: 73 mL/min/{1.73_m2} (ref >59)
GFR, EST NON AFRICAN AMERICAN: 63 mL/min/{1.73_m2} (ref >59)
GLUCOSE: 130 mg/dL — AB (ref 65–99)
Potassium: 4.6 mmol/L (ref 3.5–5.2)
Sodium: 138 mmol/L (ref 134–144)

## 2018-05-09 ENCOUNTER — Other Ambulatory Visit: Payer: Self-pay

## 2018-05-09 ENCOUNTER — Ambulatory Visit (HOSPITAL_COMMUNITY): Payer: 59 | Attending: Cardiology

## 2018-05-09 ENCOUNTER — Ambulatory Visit (INDEPENDENT_AMBULATORY_CARE_PROVIDER_SITE_OTHER): Payer: 59 | Admitting: Pharmacist

## 2018-05-09 DIAGNOSIS — E669 Obesity, unspecified: Secondary | ICD-10-CM | POA: Insufficient documentation

## 2018-05-09 DIAGNOSIS — I482 Chronic atrial fibrillation: Secondary | ICD-10-CM

## 2018-05-09 DIAGNOSIS — I429 Cardiomyopathy, unspecified: Secondary | ICD-10-CM | POA: Insufficient documentation

## 2018-05-09 DIAGNOSIS — G4733 Obstructive sleep apnea (adult) (pediatric): Secondary | ICD-10-CM | POA: Insufficient documentation

## 2018-05-09 DIAGNOSIS — I4891 Unspecified atrial fibrillation: Secondary | ICD-10-CM | POA: Diagnosis not present

## 2018-05-09 DIAGNOSIS — I4821 Permanent atrial fibrillation: Secondary | ICD-10-CM

## 2018-05-09 DIAGNOSIS — I10 Essential (primary) hypertension: Secondary | ICD-10-CM | POA: Insufficient documentation

## 2018-05-09 DIAGNOSIS — M1711 Unilateral primary osteoarthritis, right knee: Secondary | ICD-10-CM | POA: Insufficient documentation

## 2018-05-09 DIAGNOSIS — E119 Type 2 diabetes mellitus without complications: Secondary | ICD-10-CM | POA: Insufficient documentation

## 2018-05-09 MED ORDER — ENOXAPARIN SODIUM 120 MG/0.8ML ~~LOC~~ SOLN: 120.0000 mg | Freq: Two times a day (BID) | SUBCUTANEOUS | 0 refills | Status: DC

## 2018-05-09 MED ORDER — PERFLUTREN LIPID MICROSPHERE
1.0000 mL | INTRAVENOUS | Status: AC | PRN
  Administered 2018-05-09: 2 mL via INTRAVENOUS

## 2018-05-09 MED FILL — ENOXAPARIN 120 MG/0.8 ML SY: 120 | 5 days supply | Qty: 8 | Fill #0

## 2018-05-09 NOTE — Patient Instructions (Addendum)
Right TKA 10/1: 9/27: Last dose of Xarelto  9/28: Inject Lovenox 120mg  at 8am and 8pm 9/29: Inject Lovenox 120mg  at 8am and 8pm 9/30: Inject Lovenox 120mg  at 8am. No PM injection 10/1: Procedure date - no anticoagulation 10/2: Resume Xarelto when instructed, ideally this evening if able   Left TKA 10/29: 10/25: Last dose of Xarelto 10/26: Inject Lovenox 120mg  at 8am and 8pm 10/27: Inject Lovenox 120mg  at 8am and 8pm 10/28: Inject Lovenox 120mg  at 8am. No PM injection 10/29: Procedure date - no anticoagulation 10/30: Resume Xarelto when instructed, ideally this evening if able

## 2018-05-09 NOTE — H&P (Signed)
TOTAL KNEE ADMISSION H&P  Patient is being admitted for right total knee arthroplasty.  Subjective:  Chief Complaint:   Right knee primary OA / pain  HPI: William Barges., 57 y.o. male, has a history of pain and functional disability in the right knee due to arthritis and has failed non-surgical conservative treatments for greater than 12 weeks to include NSAID's and/or analgesics and activity modification.  Onset of symptoms was gradual, starting >10 years ago with gradually worsening course since that time. The patient noted no past surgery on the right knee(s).  Patient currently rates pain in the right knee(s) at 7 out of 10 with activity. Patient has worsening of pain with activity and weight bearing, pain that interferes with activities of daily living, pain with passive range of motion, crepitus and joint swelling.  Patient has evidence of periarticular osteophytes and joint space narrowing by imaging studies.  There is no active infection.   Risks, benefits and expectations were discussed with the patient.  Risks including but not limited to the risk of anesthesia, blood clots, nerve damage, blood vessel damage, failure of the prosthesis, infection and up to and including death.  Patient understand the risks, benefits and expectations and wishes to proceed with surgery.   PCP: Denita Lung, MD  D/C Plans:       Home   Post-op Meds:       No Rx given  Tranexamic Acid:      To be given - IV   Decadron:      Is to be given  FYI:     Xarelto (on pre-op)  Oxy  CPAP  DME:   Pt already has equipment  PT:   OPPT     Patient Active Problem List   Diagnosis Date Noted  . Tubular adenoma of colon 10/18/2017  . 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 Diabetic nephropathy associated with type 2 diabetes mellitus ([place]) 10/13/2016  . Permanent atrial fibrillation ([place]) 10/13/2016  . Polycythemia vera ([place]) 10/09/2014  . Hyperlipidemia associated with type 2 diabetes  mellitus ([place]) 09/17/2014  . Hypertension associated with diabetes ([place]) 09/17/2014  . Diabetes mellitus ([place]) 10/31/2013  . Hx-TIA (transient ischemic attack) 09/05/2012  . GERD (gastroesophageal reflux disease) 08/17/2011  . Obesity (BMI 30-39.9) 08/17/2011  . Psoriatic arthritis ([place]) 08/17/2011  . Obstructive sleep apnea 08/05/2009   Past Medical History:  Diagnosis Date  . Arthritis   . Diverticulosis   . Dyslipidemia   . GERD (gastroesophageal reflux disease)   . Herpes labialis   . Hypertension   . Longstanding persistent atrial fibrillation ([place])   . Metabolic syndrome   . Obesity   . Proteinuria   . Psoriasis   . Seborrheic dermatitis   . Sleep apnea    very compliant with CPAP  . TIA (transient ischemic attack) 2009    Past Surgical History:  Procedure Laterality Date  . CARDIOVERSION N/A 12/02/2016   Procedure: CARDIOVERSION;  Surgeon: Pixie Casino, MD;  Location[facility] ENDOSCOPY;  Service: Cardiovascular;  Laterality: N/A;  . COLONOSCOPY    . JOINT REPLACEMENT  11/03/10   LT HIP  . TONSILLECTOMY AND ADENOIDECTOMY    . TOTAL HIP ARTHROPLASTY Left   . WISDOM TOOTH EXTRACTION      Current Facility-Administered Medications  Medication Dose Route Frequency Provider Last Rate Last Dose  . 0.9 %  sodium chloride infusion  500 mL Intravenous Once Ladene Artist, MD  Current Outpatient Medications  Medication Sig Dispense Refill Last Dose  . ACCU-CHEK FASTCLIX LANCETS MISC CHECK BLOOD SUGAR TWICE A DAY 102 each 4 Taking  . ACCU-CHEK GUIDE test strip TEST BLOOD SUGAR TWICE A DAY 200 each 12 Taking  . benazepril (LOTENSIN) 40 MG tablet Take 1 tablet (40 mg total) by mouth daily. 90 tablet 3 Taking  . clobetasol (TEMOVATE) 0.05 % external solution APPLY TO AFFECTED AREAS TWICE DAILY AS NEEDED FOR FLAREUPS.  0 Taking  . diltiazem (CARTIA XT) 240 MG 24 hr capsule Take 1 capsule (240 mg total) by mouth daily. 90 capsule 3 Taking  . Dulaglutide (TRULICITY) 2.35  TI/1.4ER SOPN Inject 1 applicator into the skin once a week. 4 pen 5 Taking  . enoxaparin (LOVENOX) 120 MG/0.8ML injection Inject 0.8 mLs (120 mg total) into the skin every 12 (twelve) hours. 10 Syringe 0   . Ixekizumab (TALTZ) 80 MG/ML SOAJ Inject 80 mg into the skin every 30 (thirty) days. 1 mL 5 Taking  . metFORMIN (GLUCOPHAGE-XR) 500 MG 24 hr tablet TAKE ONE TABLET BY MOUTH TWICE DAILY 180 tablet 1 Taking  . pantoprazole (PROTONIX) 40 MG tablet Take 1 tablet (40 mg total) by mouth every other day. 90 tablet 3 Taking  . rosuvastatin (CRESTOR) 10 MG tablet Take 1 tablet (10 mg total) by mouth daily. 90 tablet 3 Taking  . ULTICARE ALCOHOL SWABS 70 % PADS APPLY 2 TIMES DAILY. 120 each 3 Taking  . Vitamin D, Cholecalciferol, 1000 units CAPS Take 1 capsule by mouth daily.   Taking  . XARELTO 20 MG TABS tablet TAKE 1 TABLET BY MOUTH DAILY WITH SUPPER. 90 tablet 1 Taking   No Known Allergies  Social History   Tobacco Use  . Smoking status: Never Smoker  . Smokeless tobacco: Never Used  Substance Use Topics  . Alcohol use: Yes    Comment: 2/month    Family History  Problem Relation Age of Onset  . Uterine cancer Mother 44       Cervical cath  . Crohn's disease Mother   . Esophageal cancer Father   . Cancer Other   . Obesity Other      Review of Systems  Constitutional: Negative.   HENT: Negative.   Eyes: Negative.   Respiratory: Negative.   Cardiovascular: Negative.   Gastrointestinal: Positive for diarrhea and heartburn.  Genitourinary: Negative.   Musculoskeletal: Positive for back pain and joint pain.  Skin: Negative.   Neurological: Negative.   Endo/Heme/Allergies: Negative.   Psychiatric/Behavioral: Negative.     Objective:  Physical Exam  Constitutional: He is oriented to person, place, and time. He appears well-developed.  HENT:  Head: Normocephalic.  Eyes: Pupils are equal, round, and reactive to light.  Neck: Neck supple. No JVD present. No tracheal deviation  present. No thyromegaly present.  Cardiovascular: Normal rate, regular rhythm and intact distal pulses.  A-fib  Respiratory: Effort normal and breath sounds normal. No respiratory distress. He has no wheezes.  GI: Soft. There is no tenderness. There is no guarding.  Musculoskeletal:       Right knee: He exhibits decreased range of motion, swelling and bony tenderness. He exhibits no ecchymosis, no deformity, no laceration and no erythema. Tenderness found.  Lymphadenopathy:    He has no cervical adenopathy.  Neurological: He is alert and oriented to person, place, and time.  Skin: Skin is warm and dry.  Psychiatric: He has a normal mood and affect.      Labs:  Estimated body mass index is 40.76 kg/m as calculated from the following:   Height as of 05/06/18: 5\' 9"  (1.753 m).   Weight as of 05/06/18: 125.2 kg.   Imaging Review Plain radiographs demonstrate severe degenerative joint disease of the right knee.  The bone quality appears to be good for age and reported activity level.   Preoperative templating of the joint replacement has been completed, documented, and submitted to the Operating Room personnel in order to optimize intra-operative equipment management.    Patient's anticipated LOS is less than 2 midnights, meeting these requirements: - Younger than 34 - Lives within 1 hour of care - Has a competent adult at home to recover with post-op recover - NO history of  - Chronic pain requiring opiods  - Diabetes  - Heart failure  - Heart attack  - Stroke  - DVT/VTE  - Respiratory Failure/COPD  - Renal failure  - Anemia  - Advanced Liver disease        Assessment/Plan:  End stage arthritis, right knee   The patient history, physical examination, clinical judgment of the provider and imaging studies are consistent with end stage degenerative joint disease of the right knee(s) and total knee arthroplasty is deemed medically necessary. The treatment options  including medical management, injection therapy arthroscopy and arthroplasty were discussed at length. The risks and benefits of total knee arthroplasty were presented and reviewed. The risks due to aseptic loosening, infection, stiffness, patella tracking problems, thromboembolic complications and other imponderables were discussed. The patient acknowledged the explanation, agreed to proceed with the plan and consent was signed. Patient is being admitted for inpatient treatment for surgery, pain control, PT, OT, prophylactic antibiotics, VTE prophylaxis, progressive ambulation and ADL's and discharge planning. The patient is planning to be discharged home.       West Pugh Zephyr Sausedo   PA-C  05/09/2018, 9:54 AM

## 2018-05-09 NOTE — Progress Notes (Signed)
Pt presents for Lovenox bridging off of Xarelto prior to 2 separate TKAs coming up in October. Will dose Lovenox at 1mg /kg BID since pt weight is too high to dose 1.5mg /kg daily with available Lovenox strengths. Pt aware to call with any concerns. Pt takes Xarelto after breakfast in the AM.   Right TKA 10/1: 9/27: Last dose of Xarelto  9/28: Inject Lovenox 120mg  at 8am and 8pm 9/29: Inject Lovenox 120mg  at 8am and 8pm 9/30: Inject Lovenox 120mg  at 8am. No PM injection 10/1: Procedure date - no anticoagulation 10/2: Resume Xarelto when instructed, ideally this evening if able   Left TKA 10/29: 10/25: Last dose of Xarelto 10/26: Inject Lovenox 120mg  at 8am and 8pm 10/27: Inject Lovenox 120mg  at 8am and 8pm 10/28: Inject Lovenox 120mg  at 8am. No PM injection 10/29: Procedure date - no anticoagulation 10/30: Resume Xarelto when instructed, ideally this evening if able

## 2018-05-10 MED FILL — TRULICITY 0.75 MG/0.5 ML PE: 0.75 | 28 days supply | Qty: 2 | Fill #2

## 2018-05-10 MED FILL — TALTZ 80 MG/ML AUTOINJECTOR: 80 | 28 days supply | Qty: 1 | Fill #3

## 2018-05-11 ENCOUNTER — Telehealth: Payer: Self-pay | Admitting: Physician Assistant

## 2018-05-11 NOTE — Telephone Encounter (Signed)
Called patient to discuss echo result.  Left message to call back for test result and upcoming knee surgeries.  I have discussed his echo with Dr. Rayann Heman.  His LVEF has changed from 40-45% >> 35-40%.  This is a small change though given he will be having 2 surgeries coming up, felt best we have him get a lexiscan stress test, which will need to be scheduled ASAP not to interrupt his surgery dates.  Tommye Standard, PA-C

## 2018-05-12 ENCOUNTER — Telehealth: Payer: Self-pay | Admitting: Physician Assistant

## 2018-05-12 DIAGNOSIS — I429 Cardiomyopathy, unspecified: Secondary | ICD-10-CM

## 2018-05-12 DIAGNOSIS — I4821 Permanent atrial fibrillation: Secondary | ICD-10-CM

## 2018-05-12 NOTE — Telephone Encounter (Signed)
LMOVM TO CALL CLINIC BACK FOR RESULTS AND STRESS TEST TO BE SCHEDULED

## 2018-05-12 NOTE — Telephone Encounter (Signed)
New Message:    Patient calling for Echo results

## 2018-05-13 NOTE — Telephone Encounter (Signed)
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 Advised patient on lexiscan stress test and gave instructions. A letter was made and a message sent to scheduling.

## 2018-05-13 NOTE — Telephone Encounter (Signed)
Follow up:   Patient returning [facility]  Call from yesterday about some results.

## 2018-05-17 ENCOUNTER — Telehealth (HOSPITAL_COMMUNITY): Payer: Self-pay | Admitting: *Deleted

## 2018-05-17 NOTE — Telephone Encounter (Signed)
Left message on voicemail per DPR in reference to upcoming appointment scheduled on 05/23/18 at [facility] with detailed instructions given per Myocardial Perfusion Study Information Sheet for the test. LM to arrive 15 minutes early, and that it is imperative to arrive on time for appointment to keep from having the test rescheduled. If you need to cancel or reschedule your appointment, please call the office within 24 hours of your appointment. Failure to do so may result in a cancellation of your appointment, and a $50 no show fee. Phone number given for call back for any questions. Exa Bomba, Ranae Palms

## 2018-05-23 ENCOUNTER — Ambulatory Visit (HOSPITAL_COMMUNITY): Payer: 59 | Attending: Cardiology

## 2018-05-23 VITALS — Wt 276.0 lb

## 2018-05-23 DIAGNOSIS — D2261 Melanocytic nevi of right upper limb, including shoulder: Secondary | ICD-10-CM | POA: Diagnosis not present

## 2018-05-23 DIAGNOSIS — G459 Transient cerebral ischemic attack, unspecified: Secondary | ICD-10-CM | POA: Diagnosis not present

## 2018-05-23 DIAGNOSIS — D2262 Melanocytic nevi of left upper limb, including shoulder: Secondary | ICD-10-CM | POA: Diagnosis not present

## 2018-05-23 DIAGNOSIS — I429 Cardiomyopathy, unspecified: Secondary | ICD-10-CM | POA: Insufficient documentation

## 2018-05-23 DIAGNOSIS — I252 Old myocardial infarction: Secondary | ICD-10-CM | POA: Insufficient documentation

## 2018-05-23 DIAGNOSIS — I10 Essential (primary) hypertension: Secondary | ICD-10-CM | POA: Diagnosis not present

## 2018-05-23 DIAGNOSIS — R110 Nausea: Secondary | ICD-10-CM

## 2018-05-23 DIAGNOSIS — L400 Psoriasis vulgaris: Secondary | ICD-10-CM | POA: Diagnosis not present

## 2018-05-23 DIAGNOSIS — Z79899 Other long term (current) drug therapy: Secondary | ICD-10-CM | POA: Diagnosis not present

## 2018-05-23 DIAGNOSIS — E119 Type 2 diabetes mellitus without complications: Secondary | ICD-10-CM | POA: Diagnosis not present

## 2018-05-23 DIAGNOSIS — L720 Epidermal cyst: Secondary | ICD-10-CM | POA: Diagnosis not present

## 2018-05-23 DIAGNOSIS — I4891 Unspecified atrial fibrillation: Secondary | ICD-10-CM | POA: Diagnosis not present

## 2018-05-23 DIAGNOSIS — Z85828 Personal history of other malignant neoplasm of skin: Secondary | ICD-10-CM | POA: Diagnosis not present

## 2018-05-23 LAB — MYOCARDIAL PERFUSION IMAGING
CHL CUP NUCLEAR SRS: 4
CHL CUP RESTING HR STRESS: 83 {beats}/min
CSEPPHR: 93 {beats}/min
LV dias vol: 232 mL (ref 62–150)
LV sys vol: 168 mL
RATE: 0.37
SDS: 2
SSS: 6
TID: 1.09

## 2018-05-23 MED ORDER — TECHNETIUM TC 99M TETROFOSMIN IV KIT
32.8000 | PACK | Freq: Once | INTRAVENOUS | Status: AC | PRN
  Administered 2018-05-23: 32.8 via INTRAVENOUS
  Filled 2018-05-23: qty 33

## 2018-05-23 MED ORDER — TECHNETIUM TC 99M TETROFOSMIN IV KIT
10.2000 | PACK | Freq: Once | INTRAVENOUS | Status: AC | PRN
  Administered 2018-05-23: 10.2 via INTRAVENOUS
  Filled 2018-05-23: qty 11

## 2018-05-23 MED ORDER — AMINOPHYLLINE 25 MG/ML IV SOLN
75.0000 mg | Freq: Once | INTRAVENOUS | Status: AC
  Administered 2018-05-23: 75 mg via INTRAVENOUS

## 2018-05-23 MED ORDER — REGADENOSON 0.4 MG/5ML IV SOLN
0.4000 mg | Freq: Once | INTRAVENOUS | Status: AC
  Administered 2018-05-23: 0.4 mg via INTRAVENOUS

## 2018-05-23 MED FILL — CLOBETASOL PROPIONATE 0.05: 0.05 | 15 days supply | Qty: 60 | Fill #0

## 2018-05-24 NOTE — Patient Instructions (Addendum)
Noelle Penner Jr.  05/24/2018   Your procedure is scheduled on: Tuesday 05/31/2018  Report to [facility] Main  Entrance              Report to admitting at   0530  AM    Call this number if you have problems the morning of surgery [phone]              Please bring CPAP mask and tubing with you to the hospital!             How to Manage Your Diabetes Before and After Surgery  Why is it important to control my blood sugar before and after surgery? . Improving blood sugar levels before and after surgery helps healing and can limit problems. . A way of improving blood sugar control is eating a healthy diet by: o  Eating less sugar and carbohydrates o  Increasing activity/exercise o  Talking with your doctor about reaching your blood sugar goals . High blood sugars (greater than 180 mg/dL) can raise your risk of infections and slow your recovery, so you will need to focus on controlling your diabetes during the weeks before surgery. . Make sure that the doctor who takes care of your diabetes knows about your planned surgery including the date and location.  How do I manage my blood sugar before surgery? . Check your blood sugar at least 4 times a day, starting 2 days before surgery, to make sure that the level is not too high or low. o Check your blood sugar the morning of your surgery when you wake up and every 2 hours until you get to the Short Stay unit. . If your blood sugar is less than 70 mg/dL, you will need to treat for low blood sugar: o Do not take insulin. o Treat a low blood sugar (less than 70 mg/dL) with  cup of clear juice (cranberry or apple), 4 glucose tablets, OR glucose gel. o Recheck blood sugar in 15 minutes after treatment (to make sure it is greater than 70 mg/dL). If your blood sugar is not greater than 70 mg/dL on recheck, call [phone] for further instructions. . Report your blood sugar to the short stay nurse when you get to  Short Stay.  . If you are admitted to the hospital after surgery: o Your blood sugar will be checked by the staff and you will probably be given insulin after surgery (instead of oral diabetes medicines) to make sure you have good blood sugar levels. o The goal for blood sugar control after surgery is 80-180 mg/dL.   WHAT DO I DO ABOUT MY DIABETES MEDICATION?       Take the day before surgery, take Metformin as usual!   . Do not take oral diabetes medicines (pills) the morning of surgery.  . The day of surgery, do not take other diabetes injectables, including Byetta (exenatide), Bydureon (exenatide ER), Victoza (liraglutide), or Trulicity (dulaglutide).        Remember: Do not eat food or drink liquids :After Midnight.   BRUSH YOUR TEETH MORNING OF SURGERY AND RINSE YOUR MOUTH OUT, NO CHEWING GUM CANDY OR MINTS.     Take these medicines the morning of surgery with A SIP OF WATER: Diltiazem (Cartia XT), Rosuvastatin (Crestor), Pantoprazole (Protonix) if day to take   DO NOT TAKE ANY DIABETIC MEDICATIONS DAY OF YOUR SURGERY  You may not have any metal on your body including hair pins and              piercings  Do not wear jewelry, make-up, lotions, powders or perfumes, deodorant                         Men may shave face and neck.   Do not bring valuables to the hospital. [place].  Contacts, dentures or bridgework may not be worn into surgery.  Leave suitcase in the car. After surgery it may be brought to your room.                   Please read over the following fact sheets you were given: _____________________________________________________________________             [facility] - Preparing for Surgery Before surgery, you can play an important role.  Because skin is not sterile, your skin needs to be as free of germs as possible.  You can reduce the number of germs on your skin by  washing with CHG (chlorahexidine gluconate) soap before surgery.  CHG is an antiseptic cleaner which kills germs and bonds with the skin to continue killing germs even after washing. Please DO NOT use if you have an allergy to CHG or antibacterial soaps.  If your skin becomes reddened/irritated stop using the CHG and inform your nurse when you arrive at Short Stay. Do not shave (including legs and underarms) for at least 48 hours prior to the first CHG shower.  You may shave your face/neck. Please follow these instructions carefully:  1.  Shower with CHG Soap the night before surgery and the  morning of Surgery.  2.  If you choose to wash your hair, wash your hair first as usual with your  normal  shampoo.  3.  After you shampoo, rinse your hair and body thoroughly to remove the  shampoo.                           4.  Use CHG as you would any other liquid soap.  You can apply chg directly  to the skin and wash                       Gently with a scrungie or clean washcloth.  5.  Apply the CHG Soap to your body ONLY FROM THE NECK DOWN.   Do not use on face/ open                           Wound or open sores. Avoid contact with eyes, ears mouth and genitals (private parts).                       Wash face,  Genitals (private parts) with your normal soap.             6.  Wash thoroughly, paying special attention to the area where your surgery  will be performed.  7.  Thoroughly rinse your body with warm water from the neck down.  8.  DO NOT shower/wash with your normal soap after using and rinsing off  the CHG Soap.  9.  Pat yourself dry with a clean towel.            10.  Wear clean pajamas.            11.  Place clean sheets on your bed the night of your first shower and do not  sleep with pets. Day of Surgery : Do not apply any lotions/deodorants the morning of surgery.  Please wear clean clothes to the hospital/surgery center.  FAILURE TO FOLLOW THESE INSTRUCTIONS MAY RESULT IN THE  CANCELLATION OF YOUR SURGERY PATIENT SIGNATURE_________________________________  NURSE SIGNATURE__________________________________  ________________________________________________________________________   [name]  An incentive spirometer is a tool that can help keep your lungs clear and active. This tool measures how well you are filling your lungs with each breath. Taking long deep breaths may help reverse or decrease the chance of developing breathing (pulmonary) problems (especially infection) following:  A long period of time when you are unable to move or be active. BEFORE THE PROCEDURE   If the spirometer includes an indicator to show your best effort, your nurse or respiratory therapist will set it to a desired goal.  If possible, sit up straight or lean slightly forward. Try not to slouch.  Hold the incentive spirometer in an upright position. INSTRUCTIONS FOR USE  1. Sit on the edge of your bed if possible, or sit up as far as you can in bed or on a chair. 2. Hold the incentive spirometer in an upright position. 3. Breathe out normally. 4. Place the mouthpiece in your mouth and seal your lips tightly around it. 5. Breathe in slowly and as deeply as possible, raising the piston or the ball toward the top of the column. 6. Hold your breath for 3-5 seconds or for as long as possible. Allow the piston or ball to fall to the bottom of the column. 7. Remove the mouthpiece from your mouth and breathe out normally. 8. Rest for a few seconds and repeat Steps 1 through 7 at least 10 times every 1-2 hours when you are awake. Take your time and take a few normal breaths between deep breaths. 9. The spirometer may include an indicator to show your best effort. Use the indicator as a goal to work toward during each repetition. 10. After each set of 10 deep breaths, practice coughing to be sure your lungs are clear. If you have an incision (the cut made at the time of surgery),  support your incision when coughing by placing a pillow or rolled up towels firmly against it. Once you are able to get out of bed, walk around indoors and cough well. You may stop using the incentive spirometer when instructed by your caregiver.  RISKS AND COMPLICATIONS  Take your time so you do not get dizzy or light-headed.  If you are in pain, you may need to take or ask for pain medication before doing incentive spirometry. It is harder to take a deep breath if you are having pain. AFTER USE  Rest and breathe slowly and easily.  It can be helpful to keep track of a log of your progress. Your caregiver can provide you with a simple table to help with this. If you are using the spirometer at home, follow these instructions: [place] IF:   You are having difficultly using the spirometer.  You have trouble using the spirometer as often as instructed.  Your pain medication is not giving enough relief while using the spirometer.  You  develop fever of 100.5 F (38.1 C) or higher. SEEK IMMEDIATE MEDICAL CARE IF:   You cough up bloody sputum that had not been present before.  You develop fever of 102 F (38.9 C) or greater.  You develop worsening pain at or near the incision site. MAKE SURE YOU:   Understand these instructions.  Will watch your condition.  Will get help right away if you are not doing well or get worse. Document Released: 12/28/2006 Document Revised: 11/09/2011 Document Reviewed: 02/28/2007 ExitCare Patient Information 2014 ExitCare, [place].   ________________________________________________________________________  WHAT IS A BLOOD TRANSFUSION? Blood Transfusion Information  A transfusion is the replacement of blood or some of its parts. Blood is made up of multiple cells which provide different functions.  Red blood cells carry oxygen and are used for blood loss replacement.  White blood cells fight against infection.  Platelets control  bleeding.  Plasma helps clot blood.  Other blood products are available for specialized needs, such as hemophilia or other clotting disorders. BEFORE THE TRANSFUSION  Who gives blood for transfusions?   Healthy volunteers who are fully evaluated to make sure their blood is safe. This is blood bank blood. Transfusion therapy is the safest it has ever been in the practice of medicine. Before blood is taken from a donor, a complete history is taken to make sure that person has no history of diseases nor engages in risky social behavior (examples are intravenous drug use or sexual activity with multiple partners). The donor's travel history is screened to minimize risk of transmitting infections, such as malaria. The donated blood is tested for signs of infectious diseases, such as HIV and hepatitis. The blood is then tested to be sure it is compatible with you in order to minimize the chance of a transfusion reaction. If you or a relative donates blood, this is often done in anticipation of surgery and is not appropriate for emergency situations. It takes many days to process the donated blood. RISKS AND COMPLICATIONS Although transfusion therapy is very safe and saves many lives, the main dangers of transfusion include:   Getting an infectious disease.  Developing a transfusion reaction. This is an allergic reaction to something in the blood you were given. Every precaution is taken to prevent this. The decision to have a blood transfusion has been considered carefully by your caregiver before blood is given. Blood is not given unless the benefits outweigh the risks. AFTER THE TRANSFUSION  Right after receiving a blood transfusion, you will usually feel much better and more energetic. This is especially true if your red blood cells have gotten low (anemic). The transfusion raises the level of the red blood cells which carry oxygen, and this usually causes an energy increase.  The nurse  administering the transfusion will monitor you carefully for complications. HOME CARE INSTRUCTIONS  No special instructions are needed after a transfusion. You may find your energy is better. Speak with your caregiver about any limitations on activity for underlying diseases you may have. SEEK MEDICAL CARE IF:   Your condition is not improving after your transfusion.  You develop redness or irritation at the intravenous (IV) site. SEEK IMMEDIATE MEDICAL CARE IF:  Any of the following symptoms occur over the next 12 hours:  Shaking chills.  You have a temperature by mouth above 102 F (38.9 C), not controlled by medicine.  Chest, back, or muscle pain.  People around you feel you are not acting correctly or are confused.  Shortness of  breath or difficulty breathing.  Dizziness and fainting.  You get a rash or develop hives.  You have a decrease in urine output.  Your urine turns a dark color or changes to pink, red, or brown. Any of the following symptoms occur over the next 10 days:  You have a temperature by mouth above 102 F (38.9 C), not controlled by medicine.  Shortness of breath.  Weakness after normal activity.  The white part of the eye turns yellow (jaundice).  You have a decrease in the amount of urine or are urinating less often.  Your urine turns a dark color or changes to pink, red, or brown. Document Released: 08/14/2000 Document Revised: 11/09/2011 Document Reviewed: 04/02/2008 [facility] Patient Information 2014 [place], [place].  _______________________________________________________________________

## 2018-05-24 NOTE — Progress Notes (Addendum)
05/23/2018-  Noted in Epic-Stress test  05/09/2018- noted in Epic- ECHO and office note from [facility], [facility] about Lovenox bridging  05/06/2018- Noted in [place]- EKG and office note from [place], [place] and labs-CBC, BMP  03/21/2018- noted in Epic- HgA1C results- 6.7

## 2018-05-25 ENCOUNTER — Encounter (HOSPITAL_COMMUNITY): Payer: Self-pay

## 2018-05-25 ENCOUNTER — Encounter (HOSPITAL_COMMUNITY)
Admission: RE | Admit: 2018-05-25 | Discharge: 2018-05-25 | Disposition: A | Discharge status: Home / Self Care | Payer: 59 | Source: Ambulatory Visit | Attending: Orthopedic Surgery | Admitting: Orthopedic Surgery

## 2018-05-25 ENCOUNTER — Other Ambulatory Visit: Payer: Self-pay

## 2018-05-25 DIAGNOSIS — Z01812 Encounter for preprocedural laboratory examination: Secondary | ICD-10-CM | POA: Insufficient documentation

## 2018-05-25 DIAGNOSIS — G4733 Obstructive sleep apnea (adult) (pediatric): Secondary | ICD-10-CM | POA: Diagnosis not present

## 2018-05-25 LAB — SURGICAL PCR SCREEN
MRSA, PCR: NEGATIVE
Staphylococcus aureus: NEGATIVE

## 2018-05-25 LAB — GLUCOSE, CAPILLARY: Glucose-Capillary: 135 mg/dL — ABNORMAL HIGH (ref 70–99)

## 2018-05-26 ENCOUNTER — Other Ambulatory Visit: Payer: Self-pay | Admitting: *Deleted

## 2018-05-26 ENCOUNTER — Encounter: Payer: Self-pay | Admitting: *Deleted

## 2018-05-26 NOTE — Patient Outreach (Addendum)
[facility]) Care Management  05/26/2018  William MEALS Jr. 10/15/1960 [id number]   Subjective: Telephone call to patient's home / mobile number, spoke with patient, and HIPAA verified.  Discussed [facility] Care Management UMR Transition of care follow up, preoperative call follow up, patient voiced understanding, and is in agreement to both types of follow up.   Patient gave [facility] verbal authorization to speak with wife [name] regarding healthcare needs as needed.   Patient states he is doing well ready for right knee surgery at [facility] on 05/31/18, left knee surgery on 06/28/18 at [facility], estimated length of stay for both surgeries is 1 - 2 days, and planning to do outpatient rehab at a [facility] post hospital discharge.  Patient states he is aware of outpatient rehab benefit at [facility] facility versus non-[facility] facility.  Discussed importance of hospital follow up with primary MD, patient voices understanding, and states he will follow up as appropriate. Patient states he is able to manage self care and has assistance as needed post hospital discharge.   Patient voices understanding of medical diagnosis, pending surgery, and treatment plan.  Patient added his wife to call and discussed [facility] benefits. Discussed [facility] Care Management UMR Transition of care follow up, preoperative call follow up, wife voiced understanding, and in agreement to follow up.    States she is accessing the following [facility] benefits on patient's behalf: outpatient pharmacy, hospital indemnity (not sure if chosen, verbally given contact number for [employer] [phone], will file claim if appropriate, verbally given contact number for [employer] [phone] to request itemized bill), and  family medical leave act (FMLA) not needed at this time.   Discussed [employer] Program resource for [facility],  verbally given contact number [phone]), patient voiced  understanding, wife voiced understanding, and states she will follow up to see if any assistance is available.  Patient is active with [facility]  Diabetes Management program app.  Patient states he does not have any preoperative questions, care coordination, transportation, community resource, or pharmacy needs at this time.   States he is very appreciative of the follow up and is in agreement to receive [place] Management information post transition of care follow up.      Objective:  Per KPN (Knowledge Performance Now, point of care tool) and chart review, patient to be admitted on 10/1/9  for RIGHT TOTAL KNEE ARTHROPLASTY at [facility].   Patient also has a history of Arthritis, Diverticulosis, diabetes, hypertension, Dyslipidemia, Longstanding persistent atrial fibrillation, Metabolic syndrome, Psoriasis, Seborrheic dermatitis, Sleep apnea (CPAP) , and TIA (transient ischemic attack).     Patent transitioned from [facility] program for diabetes management to [facility] on 08/04/17.         Assessment:  Received UMR Preoperative / Transition of care referral on 05/20/18.    Preoperative call completed, and transition of care follow up pending notification of patient discharge.      Plan: RNCM will call patient for  telephone outreach attempt, transition of care follow up, within 3 business days of hospital discharge notification.         Matteo Banke H. Annia Friendly, BSN, [place] Management [facility] Telephonic CM Phone: [phone] Fax: [phone]

## 2018-05-27 ENCOUNTER — Telehealth: Payer: Self-pay | Admitting: Physician Assistant

## 2018-05-27 NOTE — Telephone Encounter (Signed)
Reviewed stress test with Dr. Rayann Heman.  TTE noted LVEF 35-40% (prior 40-45%).  No ischemia on stress test, fixed defect only noted.  Given low risk surgeries (knees), OK to proceed from our standpoint.  Discussed with the patient findings. He has started his lovenox bridge for his first surgery  Will have him come in the next few weeks to adjust meds for is EF.  Tommye Standard, PA-C

## 2018-05-30 MED ORDER — DEXTROSE 5 % IV SOLN
3.0000 g | INTRAVENOUS | Status: AC
  Administered 2018-05-31: 3 g via INTRAVENOUS
  Filled 2018-05-30: qty 3

## 2018-05-31 ENCOUNTER — Encounter (HOSPITAL_COMMUNITY): Payer: Self-pay | Admitting: Emergency Medicine

## 2018-05-31 ENCOUNTER — Other Ambulatory Visit: Payer: Self-pay

## 2018-05-31 ENCOUNTER — Encounter (HOSPITAL_COMMUNITY)
Admission: RE | Disposition: A | Discharge status: Home / Self Care | Payer: Self-pay | Source: Ambulatory Visit | Attending: Orthopedic Surgery

## 2018-05-31 ENCOUNTER — Inpatient Hospital Stay (HOSPITAL_COMMUNITY): Payer: 59 | Admitting: Anesthesiology

## 2018-05-31 ENCOUNTER — Inpatient Hospital Stay (HOSPITAL_COMMUNITY)
Admission: RE | Admit: 2018-05-31 | Discharge: 2018-06-01 | DRG: 470 | Disposition: A | Discharge status: Home / Self Care | Payer: 59 | Source: Ambulatory Visit | Attending: Orthopedic Surgery | Admitting: Orthopedic Surgery

## 2018-05-31 DIAGNOSIS — Z85828 Personal history of other malignant neoplasm of skin: Secondary | ICD-10-CM | POA: Diagnosis not present

## 2018-05-31 DIAGNOSIS — Z79899 Other long term (current) drug therapy: Secondary | ICD-10-CM | POA: Diagnosis not present

## 2018-05-31 DIAGNOSIS — K219 Gastro-esophageal reflux disease without esophagitis: Secondary | ICD-10-CM | POA: Diagnosis present

## 2018-05-31 DIAGNOSIS — Z96642 Presence of left artificial hip joint: Secondary | ICD-10-CM | POA: Diagnosis present

## 2018-05-31 DIAGNOSIS — E1169 Type 2 diabetes mellitus with other specified complication: Secondary | ICD-10-CM | POA: Diagnosis present

## 2018-05-31 DIAGNOSIS — Z6841 Body Mass Index (BMI) 40.0 and over, adult: Secondary | ICD-10-CM

## 2018-05-31 DIAGNOSIS — E785 Hyperlipidemia, unspecified: Secondary | ICD-10-CM | POA: Diagnosis present

## 2018-05-31 DIAGNOSIS — I4821 Permanent atrial fibrillation: Secondary | ICD-10-CM | POA: Diagnosis present

## 2018-05-31 DIAGNOSIS — D45 Polycythemia vera: Secondary | ICD-10-CM | POA: Diagnosis present

## 2018-05-31 DIAGNOSIS — Z96651 Presence of right artificial knee joint: Secondary | ICD-10-CM

## 2018-05-31 DIAGNOSIS — L4050 Arthropathic psoriasis, unspecified: Secondary | ICD-10-CM | POA: Diagnosis present

## 2018-05-31 DIAGNOSIS — G8918 Other acute postprocedural pain: Secondary | ICD-10-CM | POA: Diagnosis not present

## 2018-05-31 DIAGNOSIS — Z9989 Dependence on other enabling machines and devices: Secondary | ICD-10-CM

## 2018-05-31 DIAGNOSIS — M1711 Unilateral primary osteoarthritis, right knee: Secondary | ICD-10-CM | POA: Diagnosis not present

## 2018-05-31 DIAGNOSIS — I152 Hypertension secondary to endocrine disorders: Secondary | ICD-10-CM | POA: Diagnosis present

## 2018-05-31 DIAGNOSIS — Z7984 Long term (current) use of oral hypoglycemic drugs: Secondary | ICD-10-CM

## 2018-05-31 DIAGNOSIS — E1121 Type 2 diabetes mellitus with diabetic nephropathy: Secondary | ICD-10-CM

## 2018-05-31 DIAGNOSIS — Z8673 Personal history of transient ischemic attack (TIA), and cerebral infarction without residual deficits: Secondary | ICD-10-CM | POA: Diagnosis not present

## 2018-05-31 DIAGNOSIS — I110 Hypertensive heart disease with heart failure: Secondary | ICD-10-CM | POA: Diagnosis not present

## 2018-05-31 DIAGNOSIS — G4733 Obstructive sleep apnea (adult) (pediatric): Secondary | ICD-10-CM | POA: Diagnosis not present

## 2018-05-31 HISTORY — PX: TOTAL KNEE ARTHROPLASTY: SHX125

## 2018-05-31 HISTORY — DX: Presence of right artificial knee joint: Z96.651

## 2018-05-31 LAB — TYPE AND SCREEN
ABO/RH(D): O POS
Antibody Screen: NEGATIVE

## 2018-05-31 LAB — GLUCOSE, CAPILLARY
GLUCOSE-CAPILLARY: 210 mg/dL — AB (ref 70–99)
GLUCOSE-CAPILLARY: 205 mg/dL — AB (ref 70–99)
GLUCOSE-CAPILLARY: 201 mg/dL — AB (ref 70–99)
GLUCOSE-CAPILLARY: 215 mg/dL — AB (ref 70–99)
Glucose-Capillary: 144 mg/dL — ABNORMAL HIGH (ref 70–99)

## 2018-05-31 SURGERY — ARTHROPLASTY, KNEE, TOTAL
Anesthesia: Regional | Site: Knee | Laterality: Right

## 2018-05-31 MED ORDER — IXEKIZUMAB 80 MG/ML ~~LOC~~ SOAJ: 80.0000 mg | SUBCUTANEOUS | Status: DC

## 2018-05-31 MED ORDER — PHENYLEPHRINE 40 MCG/ML (10ML) SYRINGE FOR IV PUSH (FOR BLOOD PRESSURE SUPPORT)
PREFILLED_SYRINGE | INTRAVENOUS | Status: AC
  Filled 2018-05-31: qty 10

## 2018-05-31 MED ORDER — OXYCODONE HCL 5 MG PO TABS: 5.0000 mg | ORAL_TABLET | Freq: Once | ORAL | Status: DC | PRN

## 2018-05-31 MED ORDER — BUPIVACAINE-EPINEPHRINE (PF) 0.25% -1:200000 IJ SOLN
INTRAMUSCULAR | Status: AC
  Filled 2018-05-31: qty 30

## 2018-05-31 MED ORDER — TOBRAMYCIN SULFATE 1.2 G IJ SOLR
INTRAMUSCULAR | Status: DC | PRN
  Administered 2018-05-31: 2.4 g

## 2018-05-31 MED ORDER — GABAPENTIN 300 MG PO CAPS
300.0000 mg | ORAL_CAPSULE | Freq: Three times a day (TID) | ORAL | Status: DC
  Administered 2018-05-31 – 2018-06-01 (×3): 300 mg via ORAL
  Filled 2018-05-31 (×3): qty 1

## 2018-05-31 MED ORDER — ROPIVACAINE HCL 7.5 MG/ML IJ SOLN
INTRAMUSCULAR | Status: DC | PRN
  Administered 2018-05-31: 20 mL via PERINEURAL

## 2018-05-31 MED ORDER — MENTHOL 3 MG MT LOZG: 1.0000 | LOZENGE | OROMUCOSAL | Status: DC | PRN

## 2018-05-31 MED ORDER — ROCURONIUM BROMIDE 10 MG/ML (PF) SYRINGE
PREFILLED_SYRINGE | INTRAVENOUS | Status: AC
  Filled 2018-05-31: qty 10

## 2018-05-31 MED ORDER — SODIUM CHLORIDE 0.9 % IR SOLN
Status: DC | PRN
  Administered 2018-05-31: 1000 mL

## 2018-05-31 MED ORDER — DEXAMETHASONE SODIUM PHOSPHATE 10 MG/ML IJ SOLN
10.0000 mg | Freq: Once | INTRAMUSCULAR | Status: AC
  Administered 2018-05-31: 10 mg via INTRAVENOUS

## 2018-05-31 MED ORDER — CHLORHEXIDINE GLUCONATE 4 % EX LIQD: 60.0000 mL | Freq: Once | CUTANEOUS | Status: DC

## 2018-05-31 MED ORDER — MIDAZOLAM HCL 2 MG/2ML IJ SOLN
INTRAMUSCULAR | Status: AC
  Filled 2018-05-31: qty 2

## 2018-05-31 MED ORDER — DEXAMETHASONE SODIUM PHOSPHATE 10 MG/ML IJ SOLN
INTRAMUSCULAR | Status: AC
  Filled 2018-05-31: qty 1

## 2018-05-31 MED ORDER — PROPOFOL 10 MG/ML IV BOLUS
INTRAVENOUS | Status: DC | PRN
  Administered 2018-05-31: 160 mg via INTRAVENOUS

## 2018-05-31 MED ORDER — ONDANSETRON HCL 4 MG PO TABS: 4.0000 mg | ORAL_TABLET | Freq: Four times a day (QID) | ORAL | Status: DC | PRN

## 2018-05-31 MED ORDER — LACTATED RINGERS IV SOLN
INTRAVENOUS | Status: DC
  Administered 2018-05-31 (×3): via INTRAVENOUS

## 2018-05-31 MED ORDER — FERROUS SULFATE 325 (65 FE) MG PO TABS
325.0000 mg | ORAL_TABLET | Freq: Two times a day (BID) | ORAL | Status: DC
  Administered 2018-06-01: 325 mg via ORAL
  Filled 2018-05-31: qty 1

## 2018-05-31 MED ORDER — SODIUM CHLORIDE 0.9 % IJ SOLN
INTRAMUSCULAR | Status: AC
  Filled 2018-05-31: qty 50

## 2018-05-31 MED ORDER — DULAGLUTIDE 0.75 MG/0.5ML ~~LOC~~ SOAJ: 1.0000 | SUBCUTANEOUS | Status: DC

## 2018-05-31 MED ORDER — FENTANYL CITRATE (PF) 100 MCG/2ML IJ SOLN
INTRAMUSCULAR | Status: AC
  Filled 2018-05-31: qty 2

## 2018-05-31 MED ORDER — METHOCARBAMOL 500 MG IVPB - SIMPLE MED
INTRAVENOUS | Status: AC
  Filled 2018-05-31: qty 50

## 2018-05-31 MED ORDER — OXYMETAZOLINE HCL 0.05 % NA SOLN
1.0000 | Freq: Every day | NASAL | Status: DC
  Filled 2018-05-31: qty 15

## 2018-05-31 MED ORDER — PROPOFOL 10 MG/ML IV BOLUS
INTRAVENOUS | Status: AC
  Filled 2018-05-31: qty 80

## 2018-05-31 MED ORDER — TRANEXAMIC ACID 1000 MG/10ML IV SOLN
1000.0000 mg | INTRAVENOUS | Status: AC
  Administered 2018-05-31: 1000 mg via INTRAVENOUS
  Filled 2018-05-31: qty 10

## 2018-05-31 MED ORDER — DILTIAZEM HCL ER COATED BEADS 240 MG PO CP24
240.0000 mg | ORAL_CAPSULE | Freq: Every day | ORAL | Status: DC
  Administered 2018-06-01: 240 mg via ORAL
  Filled 2018-05-31: qty 1

## 2018-05-31 MED ORDER — METFORMIN HCL ER 500 MG PO TB24
500.0000 mg | ORAL_TABLET | Freq: Two times a day (BID) | ORAL | Status: DC
  Administered 2018-06-01: 500 mg via ORAL
  Filled 2018-05-31: qty 1

## 2018-05-31 MED ORDER — 0.9 % SODIUM CHLORIDE (POUR BTL) OPTIME
TOPICAL | Status: DC | PRN
  Administered 2018-05-31: 1000 mL

## 2018-05-31 MED ORDER — TOBRAMYCIN SULFATE 1.2 G IJ SOLR
INTRAMUSCULAR | Status: DC | PRN
  Administered 2018-05-31: 1.2 g via TOPICAL

## 2018-05-31 MED ORDER — BUPIVACAINE-EPINEPHRINE (PF) 0.25% -1:200000 IJ SOLN
INTRAMUSCULAR | Status: DC | PRN
  Administered 2018-05-31: 30 mL

## 2018-05-31 MED ORDER — SUGAMMADEX SODIUM 500 MG/5ML IV SOLN
INTRAVENOUS | Status: DC | PRN
  Administered 2018-05-31: 200 mg via INTRAVENOUS

## 2018-05-31 MED ORDER — POLYETHYLENE GLYCOL 3350 17 G PO PACK: 17.0000 g | PACK | Freq: Every day | ORAL | Status: DC | PRN

## 2018-05-31 MED ORDER — ONDANSETRON HCL 4 MG/2ML IJ SOLN
INTRAMUSCULAR | Status: DC | PRN
  Administered 2018-05-31: 4 mg via INTRAVENOUS

## 2018-05-31 MED ORDER — KETOROLAC TROMETHAMINE 30 MG/ML IJ SOLN
INTRAMUSCULAR | Status: DC | PRN
  Administered 2018-05-31: 30 mg via INTRA_ARTICULAR

## 2018-05-31 MED ORDER — RIVAROXABAN 10 MG PO TABS
20.0000 mg | ORAL_TABLET | Freq: Every day | ORAL | Status: DC
  Administered 2018-06-01: 20 mg via ORAL
  Filled 2018-05-31: qty 2

## 2018-05-31 MED ORDER — METHOCARBAMOL 500 MG IVPB - SIMPLE MED
500.0000 mg | Freq: Four times a day (QID) | INTRAVENOUS | Status: DC | PRN
  Administered 2018-05-31: 500 mg via INTRAVENOUS
  Filled 2018-05-31: qty 50

## 2018-05-31 MED ORDER — MIDAZOLAM HCL 2 MG/2ML IJ SOLN
INTRAMUSCULAR | Status: DC | PRN
  Administered 2018-05-31: 2 mg via INTRAVENOUS

## 2018-05-31 MED ORDER — ROCURONIUM BROMIDE 100 MG/10ML IV SOLN
INTRAVENOUS | Status: DC | PRN
  Administered 2018-05-31: 60 mg via INTRAVENOUS
  Administered 2018-05-31: 20 mg via INTRAVENOUS

## 2018-05-31 MED ORDER — OXYCODONE HCL 5 MG/5ML PO SOLN
5.0000 mg | Freq: Once | ORAL | Status: DC | PRN
  Filled 2018-05-31: qty 5

## 2018-05-31 MED ORDER — CEFAZOLIN SODIUM-DEXTROSE 2-4 GM/100ML-% IV SOLN
2.0000 g | Freq: Four times a day (QID) | INTRAVENOUS | Status: AC
  Administered 2018-05-31 (×2): 2 g via INTRAVENOUS
  Filled 2018-05-31 (×2): qty 100

## 2018-05-31 MED ORDER — MORPHINE SULFATE (PF) 2 MG/ML IV SOLN: 0.5000 mg | INTRAVENOUS | Status: DC | PRN

## 2018-05-31 MED ORDER — POTASSIUM CHLORIDE IN NACL 20-0.45 MEQ/L-% IV SOLN
INTRAVENOUS | Status: DC
  Administered 2018-05-31 – 2018-06-01 (×2): via INTRAVENOUS
  Filled 2018-05-31 (×2): qty 1000

## 2018-05-31 MED ORDER — ACETAMINOPHEN 325 MG PO TABS: 325.0000 mg | ORAL_TABLET | Freq: Four times a day (QID) | ORAL | Status: DC | PRN

## 2018-05-31 MED ORDER — HYDROMORPHONE HCL 1 MG/ML IJ SOLN
0.5000 mg | INTRAMUSCULAR | Status: DC | PRN
  Administered 2018-05-31: 1 mg via INTRAVENOUS
  Filled 2018-05-31: qty 1

## 2018-05-31 MED ORDER — PANTOPRAZOLE SODIUM 40 MG PO TBEC
40.0000 mg | DELAYED_RELEASE_TABLET | ORAL | Status: DC
  Administered 2018-05-31: 40 mg via ORAL
  Filled 2018-05-31: qty 1

## 2018-05-31 MED ORDER — INSULIN ASPART 100 UNIT/ML ~~LOC~~ SOLN
0.0000 [IU] | Freq: Three times a day (TID) | SUBCUTANEOUS | Status: DC
  Administered 2018-06-01: 3 [IU] via SUBCUTANEOUS

## 2018-05-31 MED ORDER — DOCUSATE SODIUM 100 MG PO CAPS
100.0000 mg | ORAL_CAPSULE | Freq: Two times a day (BID) | ORAL | Status: DC
  Administered 2018-05-31 – 2018-06-01 (×2): 100 mg via ORAL
  Filled 2018-05-31 (×2): qty 1

## 2018-05-31 MED ORDER — HYDROCODONE-ACETAMINOPHEN 7.5-325 MG PO TABS
1.0000 | ORAL_TABLET | ORAL | Status: DC | PRN
  Administered 2018-05-31: 1 via ORAL
  Administered 2018-05-31 – 2018-06-01 (×5): 2 via ORAL
  Filled 2018-05-31: qty 1
  Filled 2018-05-31 (×5): qty 2

## 2018-05-31 MED ORDER — PHENYLEPHRINE HCL 10 MG/ML IJ SOLN
INTRAMUSCULAR | Status: DC | PRN
  Administered 2018-05-31 (×2): 80 ug via INTRAVENOUS
  Administered 2018-05-31: 120 ug via INTRAVENOUS

## 2018-05-31 MED ORDER — METOCLOPRAMIDE HCL 5 MG PO TABS: 5.0000 mg | ORAL_TABLET | Freq: Three times a day (TID) | ORAL | Status: DC | PRN

## 2018-05-31 MED ORDER — HYDROMORPHONE HCL 1 MG/ML IJ SOLN
0.2500 mg | INTRAMUSCULAR | Status: DC | PRN
  Administered 2018-05-31 (×4): 0.5 mg via INTRAVENOUS

## 2018-05-31 MED ORDER — LIDOCAINE HCL (CARDIAC) PF 100 MG/5ML IV SOSY
PREFILLED_SYRINGE | INTRAVENOUS | Status: DC | PRN
  Administered 2018-05-31: 80 mg via INTRATRACHEAL

## 2018-05-31 MED ORDER — KETOROLAC TROMETHAMINE 30 MG/ML IJ SOLN
INTRAMUSCULAR | Status: AC
  Filled 2018-05-31: qty 1

## 2018-05-31 MED ORDER — ONDANSETRON HCL 4 MG/2ML IJ SOLN: 4.0000 mg | Freq: Four times a day (QID) | INTRAMUSCULAR | Status: DC | PRN

## 2018-05-31 MED ORDER — METOCLOPRAMIDE HCL 5 MG/ML IJ SOLN: 5.0000 mg | Freq: Three times a day (TID) | INTRAMUSCULAR | Status: DC | PRN

## 2018-05-31 MED ORDER — PHENOL 1.4 % MT LIQD
1.0000 | OROMUCOSAL | Status: DC | PRN
  Filled 2018-05-31: qty 177

## 2018-05-31 MED ORDER — FENTANYL CITRATE (PF) 100 MCG/2ML IJ SOLN
25.0000 ug | INTRAMUSCULAR | Status: DC | PRN
  Administered 2018-05-31 (×3): 50 ug via INTRAVENOUS

## 2018-05-31 MED ORDER — TOBRAMYCIN SULFATE 1.2 G IJ SOLR
INTRAMUSCULAR | Status: AC
  Filled 2018-05-31: qty 2.4

## 2018-05-31 MED ORDER — ROSUVASTATIN CALCIUM 10 MG PO TABS
10.0000 mg | ORAL_TABLET | Freq: Every day | ORAL | Status: DC
  Administered 2018-06-01: 10 mg via ORAL
  Filled 2018-05-31: qty 1

## 2018-05-31 MED ORDER — SODIUM CHLORIDE 0.9 % IJ SOLN
INTRAMUSCULAR | Status: DC | PRN
  Administered 2018-05-31: 30 mL

## 2018-05-31 MED ORDER — FENTANYL CITRATE (PF) 100 MCG/2ML IJ SOLN
INTRAMUSCULAR | Status: DC | PRN
  Administered 2018-05-31: 100 ug via INTRAVENOUS
  Administered 2018-05-31 (×2): 50 ug via INTRAVENOUS

## 2018-05-31 MED ORDER — VITAMIN D 1000 UNITS PO TABS
1000.0000 [IU] | ORAL_TABLET | Freq: Every day | ORAL | Status: DC
  Administered 2018-06-01: 1000 [IU] via ORAL
  Filled 2018-05-31: qty 1

## 2018-05-31 MED ORDER — HYDROMORPHONE HCL 1 MG/ML IJ SOLN
INTRAMUSCULAR | Status: AC
  Filled 2018-05-31: qty 2

## 2018-05-31 MED ORDER — METHOCARBAMOL 500 MG PO TABS
500.0000 mg | ORAL_TABLET | Freq: Four times a day (QID) | ORAL | Status: DC | PRN
  Administered 2018-05-31 – 2018-06-01 (×3): 500 mg via ORAL
  Filled 2018-05-31 (×3): qty 1

## 2018-05-31 MED ORDER — ONDANSETRON HCL 4 MG/2ML IJ SOLN
INTRAMUSCULAR | Status: AC
  Filled 2018-05-31: qty 2

## 2018-05-31 MED ORDER — VANCOMYCIN HCL 1000 MG IV SOLR
INTRAVENOUS | Status: AC
  Filled 2018-05-31: qty 2000

## 2018-05-31 SURGICAL SUPPLY — 53 items
ATTUNE MED ANAT PAT 38 KNEE (Knees) ×2 IMPLANT
ATTUNE PS FEM RT SZ 6 CEM KNEE (Femur) ×2 IMPLANT
ATTUNE PSRP INSR SZ6 6 KNEE (Insert) ×2 IMPLANT
BAG ZIPLOCK 12X15 (MISCELLANEOUS) IMPLANT
BANDAGE ACE 6X5 VEL STRL LF (GAUZE/BANDAGES/DRESSINGS) ×2 IMPLANT
BASEPLATE TIB ROT PLAT SZ1 KN (Miscellaneous) ×1 IMPLANT
BLADE SAW SGTL 11.0X1.19X90.0M (BLADE) IMPLANT
BLADE SAW SGTL 13.0X1.19X90.0M (BLADE) ×2 IMPLANT
BNDG ELASTIC 6X10 VLCR STRL LF (GAUZE/BANDAGES/DRESSINGS) ×2 IMPLANT
BOWL SMART MIX CTS (DISPOSABLE) ×2 IMPLANT
CEMENT HV SMART SET (Cement) ×4 IMPLANT
COVER SURGICAL LIGHT HANDLE (MISCELLANEOUS) ×2 IMPLANT
CUFF TOURN SGL QUICK 34 (TOURNIQUET CUFF) ×1
CUFF TRNQT CYL 34X4X40X1 (TOURNIQUET CUFF) ×1 IMPLANT
DECANTER SPIKE VIAL GLASS SM (MISCELLANEOUS) ×4 IMPLANT
DERMABOND ADVANCED (GAUZE/BANDAGES/DRESSINGS) ×1
DERMABOND ADVANCED .7 DNX12 (GAUZE/BANDAGES/DRESSINGS) ×1 IMPLANT
DRAPE U-SHAPE 47X51 STRL (DRAPES) ×2 IMPLANT
DRESSING AQUACEL AG SP 3.5X10 (GAUZE/BANDAGES/DRESSINGS) ×1 IMPLANT
DRSG AQUACEL AG SP 3.5X10 (GAUZE/BANDAGES/DRESSINGS) ×2
DURAPREP 26ML APPLICATOR (WOUND CARE) ×4 IMPLANT
ELECT REM PT RETURN 15FT ADLT (MISCELLANEOUS) ×2 IMPLANT
GLOVE BIOGEL M 7.0 STRL (GLOVE) IMPLANT
GLOVE BIOGEL PI IND STRL 7.5 (GLOVE) ×1 IMPLANT
GLOVE BIOGEL PI IND STRL 8.5 (GLOVE) ×1 IMPLANT
GLOVE BIOGEL PI INDICATOR 7.5 (GLOVE) ×1
GLOVE BIOGEL PI INDICATOR 8.5 (GLOVE) ×1
GLOVE ECLIPSE 8.0 STRL XLNG CF (GLOVE) ×2 IMPLANT
GLOVE ORTHO TXT STRL SZ7.5 (GLOVE) ×4 IMPLANT
GOWN STRL REUS W/TWL 2XL LVL3 (GOWN DISPOSABLE) ×2 IMPLANT
GOWN STRL REUS W/TWL LRG LVL3 (GOWN DISPOSABLE) ×2 IMPLANT
HANDPIECE INTERPULSE COAX TIP (DISPOSABLE) ×1
HOLDER FOLEY CATH W/STRAP (MISCELLANEOUS) ×2 IMPLANT
MANIFOLD NEPTUNE II (INSTRUMENTS) ×2 IMPLANT
NDL SAFETY ECLIPSE 18X1.5 (NEEDLE) IMPLANT
NEEDLE HYPO 18GX1.5 SHARP (NEEDLE)
PACK TOTAL KNEE CUSTOM (KITS) ×2 IMPLANT
PIN FIX SIGMA HP QUICK REL (PIN) ×2 IMPLANT
POSITIONER SURGICAL ARM (MISCELLANEOUS) ×2 IMPLANT
SET HNDPC FAN SPRY TIP SCT (DISPOSABLE) ×1 IMPLANT
SET PAD KNEE POSITIONER (MISCELLANEOUS) ×2 IMPLANT
SUT MNCRL AB 4-0 PS2 18 (SUTURE) ×2 IMPLANT
SUT STRATAFIX PDS+ 0 24IN (SUTURE) ×2 IMPLANT
SUT VIC AB 1 CT1 36 (SUTURE) ×2 IMPLANT
SUT VIC AB 2-0 CT1 27 (SUTURE) ×3
SUT VIC AB 2-0 CT1 TAPERPNT 27 (SUTURE) ×3 IMPLANT
SYRINGE 3CC LL L/F (MISCELLANEOUS) ×2 IMPLANT
SYS TIBIA BASE ATTUNE KNEE SZ7 (Orthopedic Implant) ×2 IMPLANT
TIBIAL BASE ROT PLAT SZ 1 KNEE (Miscellaneous) ×2 IMPLANT
TRAY FOLEY MTR SLVR 16FR STAT (SET/KITS/TRAYS/PACK) ×2 IMPLANT
WATER STERILE IRR 1000ML POUR (IV SOLUTION) ×2 IMPLANT
WRAP KNEE MAXI GEL POST OP (GAUZE/BANDAGES/DRESSINGS) ×2 IMPLANT
YANKAUER SUCT BULB TIP 10FT TU (MISCELLANEOUS) ×2 IMPLANT

## 2018-05-31 NOTE — Evaluation (Signed)
Physical Therapy Evaluation Patient Details Name: [name]. MRN: [id number] DOB: 12-22-1960 Today's Date: 05/31/2018   History of Present Illness  Pt is a 57 YO male s/p R TKR on 05/31/18. PMH includes tubular adenoma of colon 2019, BCE, DM II with neuropathy, permanant afib, polycythemia vera 2016, HLD, HTN TIA, GERD, obesity, OSA, cardioversion, L THA 2012.  Clinical Impression   Pt presents with R knee pain, difficulty performing bed mobility/transfers/gait, and decreased tolerance for ambulation. Pt to benefit from acute PT to address deficits. Pt ambulated hallway distance with RW with min guard assist. PT to continue progressing mobility, and will continue to follow acutely.     Follow Up Recommendations Follow surgeon's recommendation for DC plan and follow-up therapies;Supervision for mobility/OOB(OPPT)    Equipment Recommendations  None recommended by PT    Recommendations for Other Services       Precautions / Restrictions Precautions Precautions: Fall Restrictions Weight Bearing Restrictions: No Other Position/Activity Restrictions: WBAT       Mobility  Bed Mobility Overal bed mobility: Needs Assistance Bed Mobility: Supine to Sit     Supine to sit: Min assist;HOB elevated     General bed mobility comments: Assist for scooting to EOB, sequencing. Increased time and effort, use of bed rails to come to sitting.   Transfers Overall transfer level: Needs assistance Equipment used: Rolling walker (2 wheeled) Transfers: Sit to/from Stand Sit to Stand: Min guard;From elevated surface         General transfer comment: Min guard for safety. Pt with strong power up and with self-steadying upon standing. Verbal cuing for hand placement.   Ambulation/Gait Ambulation/Gait assistance: Min guard Gait Distance (Feet): 80 Feet Assistive device: Rolling walker (2 wheeled) Gait Pattern/deviations: Step-to pattern;Decreased stride length;Decreased stance time -  right;Decreased weight shift to right Gait velocity: decr    General Gait Details: Min guard for safety. Verbal cuing for sequencing, placement in RW.   Stairs            Wheelchair Mobility    Modified Rankin (Stroke Patients Only)       Balance Overall balance assessment: Mild deficits observed, not formally tested                                           Pertinent Vitals/Pain Pain Assessment: 0-10 Pain Score: 8  Pain Location: R knee  Pain Descriptors / Indicators: Aching Pain Intervention(s): Limited activity within patient's tolerance;Monitored during session;Ice applied    Home Living Family/patient expects to be discharged to:: Private residence Living Arrangements: Spouse/significant other Available Help at Discharge: Family Type of Home: House Home Access: Stairs to enter Entrance Stairs-Rails: Left;Right;Can reach both Entrance Stairs-Number of Steps: 5+1 Home Layout: Able to live on main level with bedroom/bathroom;Two level Home Equipment: Walker - 2 wheels;Walker - 4 wheels;Crutches;Cane - single point;Grab bars - tub/shower;Bedside commode      Prior Function Level of Independence: Independent               Hand Dominance   Dominant Hand: Right    Extremity/Trunk Assessment   Upper Extremity Assessment Upper Extremity Assessment: Overall WFL for tasks assessed    Lower Extremity Assessment Lower Extremity Assessment: Overall WFL for tasks assessed;RLE deficits/detail RLE Deficits / Details: suspected post-surgical weakness; able to perform quad set, SLR with assist, ankle pumps  RLE Sensation: WNL  Cervical / Trunk Assessment Cervical / Trunk Assessment: Normal  Communication   Communication: No difficulties  Cognition Arousal/Alertness: Lethargic;Suspect due to medications Behavior During Therapy: [facility] for tasks assessed/performed Overall Cognitive Status: Within Functional Limits for tasks assessed                                         General Comments      Exercises     Assessment/Plan    PT Assessment Patient needs continued PT services  PT Problem List Decreased strength;Pain;Decreased activity tolerance;Decreased knowledge of use of DME;Decreased range of motion;Decreased balance;Decreased safety awareness;Decreased mobility       PT Treatment Interventions DME instruction;Therapeutic activities;Gait training;Therapeutic exercise;Patient/family education;Stair training;Balance training;Functional mobility training    PT Goals (Current goals can be found in the Care Plan section)  Acute Rehab PT Goals PT Goal Formulation: With patient Time For Goal Achievement: 06/14/18 Potential to Achieve Goals: Good    Frequency 7X/week   Barriers to discharge        Co-evaluation               AM-PAC PT "6 Clicks" Daily Activity  Outcome Measure Difficulty turning over in bed (including adjusting bedclothes, sheets and blankets)?: Unable Difficulty moving from lying on back to sitting on the side of the bed? : Unable Difficulty sitting down on and standing up from a chair with arms (e.g., wheelchair, bedside commode, etc,.)?: Unable Help needed moving to and from a bed to chair (including a wheelchair)?: A Little Help needed walking in hospital room?: A Little Help needed climbing 3-5 steps with a railing? : A Little 6 Click Score: 12    End of Session Equipment Utilized During Treatment: Gait belt Activity Tolerance: Patient tolerated treatment well Patient left: in chair;with chair alarm set;with call bell/phone within reach;with family/visitor present Nurse Communication: Mobility status PT Visit Diagnosis: Difficulty in walking, not elsewhere classified (R26.2);Other abnormalities of gait and mobility (R26.89)    Time: 5027-7412 PT Time Calculation (min) (ACUTE ONLY): 43 min   Charges:   PT Evaluation $PT Eval Low Complexity: 1 Low PT  Treatments $Gait Training: 8-22 mins        Julien Girt, PT Acute Rehabilitation Services Pager [phone]  Office [phone]  Dwyne Hasegawa D Elonda Husky 05/31/2018, 4:22 PM

## 2018-05-31 NOTE — Transfer of Care (Signed)
Immediate Anesthesia Transfer of Care Note  Patient: [name].  Procedure(s) Performed: RIGHT TOTAL KNEE ARTHROPLASTY (Right Knee)  Patient Location: PACU  Anesthesia Type:General  Level of Consciousness: awake, alert  and patient cooperative  Airway & Oxygen Therapy: Patient connected to face mask oxygen  Post-op Assessment: Report given to RN and Post -op Vital signs reviewed and stable  Post vital signs: stable  Last Vitals:  Vitals Value Taken Time  BP 149/95 05/31/2018  9:35 AM  Temp    Pulse 61 05/31/2018  9:36 AM  Resp 12 05/31/2018  9:36 AM  SpO2 96 % 05/31/2018  9:36 AM  Vitals shown include unvalidated device data.  Last Pain:  Vitals:   05/31/18 0614  TempSrc:   PainSc: 7       Patients Stated Pain Goal: 4 (25/08/71 9941)  Complications: No apparent anesthesia complications

## 2018-05-31 NOTE — Anesthesia Procedure Notes (Signed)
Anesthesia Regional Block: Adductor canal block   Pre-Anesthetic Checklist: ,, timeout performed, Correct Patient, Correct Site, Correct Laterality, Correct Procedure, Correct Position, site marked, Risks and benefits discussed,  Surgical consent,  Pre-op evaluation,  At surgeon's request and post-op pain management  Laterality: Lower and Right  Prep: chloraprep       Needles:  Injection technique: Single-shot  Needle Type: Echogenic Stimulator Needle          Additional Needles:   Procedures:,,,, ultrasound used (permanent image in chart),,,,  Narrative:  Start time: 05/31/2018 9:39 AM End time: 05/31/2018 9:43 AM Injection made incrementally with aspirations every 5 mL.  Performed by: Personally  Anesthesiologist: Oleta Mouse, MD  Additional Notes: H+P and labs reviewed, risks and benefits discussed with patient, procedure tolerated well without complications

## 2018-05-31 NOTE — Progress Notes (Addendum)
No PT/INR needed prior to surgery per Dr. Ermalene Postin.

## 2018-05-31 NOTE — Anesthesia Preprocedure Evaluation (Signed)
Anesthesia Evaluation  Patient identified by MRN, date of birth, ID band Patient awake    Reviewed: Allergy & Precautions, NPO status , Patient's Chart, lab work & pertinent test results  History of Anesthesia Complications Negative for: history of anesthetic complications  Airway Mallampati: III  TM Distance: <3 FB Neck ROM: Full    Dental  (+) Teeth Intact   Pulmonary neg shortness of breath, sleep apnea and Continuous Positive Airway Pressure Ventilation , neg COPD, neg recent URI,    breath sounds clear to auscultation       Cardiovascular hypertension, Pt. on medications +CHF  + dysrhythmias Atrial Fibrillation  Rhythm:Irregular     Neuro/Psych neg Seizures TIA   GI/Hepatic GERD  Medicated and Controlled,  Endo/Other  diabetesMorbid obesity  Renal/GU Renal InsufficiencyRenal disease     Musculoskeletal  (+) Arthritis ,   Abdominal   Peds  Hematology Xarelto held 9/25 Lovenox 1mg /kg bid last dose 9/30 1830   Anesthesia Other Findings 9/23 stress: 1. EF 28%, diffuse hypokinesis appearing worse towards the apex.  2. Fixed small, moderate intensity apical inferior and apical perfusion defect.  This is suggestive of prior infarction, no significant ischemia.   9/9 tte: Procedure narrative: Transthoracic echocardiography. Image   quality was suboptimal. The study was technically difficult.   Intravenous contrast (Definity) was administered to opacify the   LV. - Left ventricle: The cavity size was normal. Wall thickness was   increased in a pattern of mild LVH. Systolic function was   moderately reduced. The estimated ejection fraction was in the   range of 35% to 40%. Diffuse hypokinesis. The study was not   technically sufficient to allow evaluation of LV diastolic   dysfunction due to atrial fibrillation. - Aortic valve: There was no stenosis. - Aorta: Dilated aortic root and ascending aorta. Aortic root  dimension: 42 mm (ED). Ascending aortic diameter: 46 mm (S). - Mitral valve: There was trivial regurgitation. - Left atrium: The atrium was moderately dilated. - Right ventricle: The cavity size was mildly dilated. Systolic   function was normal. - Right atrium: The atrium was moderately dilated. - Pulmonary arteries: No complete TR doppler jet so unable to   estimate PA systolic pressure. - Systemic veins: IVC measured 2 cm with < 50% respirophasic   variation, suggesting RA pressure 8 mmHg. - Pericardium, extracardiac: A trivial pericardial effusion was   identified.    Reproductive/Obstetrics                             Anesthesia Physical Anesthesia Plan  ASA: III  Anesthesia Plan: General   Post-op Pain Management:  Regional for Post-op pain   Induction: Intravenous  PONV Risk Score and Plan: 2 and Dexamethasone and Ondansetron  Airway Management Planned: Oral ETT and LMA  Additional Equipment: None  Intra-op Plan:   Post-operative Plan: Extubation in OR  Informed Consent: I have reviewed the patients History and Physical, chart, labs and discussed the procedure including the risks, benefits and alternatives for the proposed anesthesia with the patient or authorized representative who has indicated his/her understanding and acceptance.   Dental advisory given  Plan Discussed with: CRNA and Surgeon  Anesthesia Plan Comments:         Anesthesia Quick Evaluation

## 2018-05-31 NOTE — Op Note (Signed)
NAME:  [name].                      MEDICAL RECORD NO.:  [id number]                             FACILITY:  [facility]      PHYSICIAN:  Pietro Cassis. Alvan Dame, M.D.  DATE OF BIRTH:  10/24/1960      DATE OF PROCEDURE:  05/31/2018                                     OPERATIVE REPORT         PREOPERATIVE DIAGNOSIS:  Right knee osteoarthritis.      POSTOPERATIVE DIAGNOSIS:  Right knee osteoarthritis.      FINDINGS:  The patient was noted to have complete loss of cartilage and   bone-on-bone arthritis with associated osteophytes in all three compartments of   the knee with a significant synovitis and associated effusion.  The patient had failed months of conservative treatment including medications, injection therapy, activity modification.     PROCEDURE:  Right total knee replacement.      COMPONENTS USED:  DePuy Attune rotating platform posterior stabilized knee   system, a size 6 femur, size 7 tibia, size 6 mm PS AOX insert, and 38 anatomic patellar   button.      SURGEON:  Pietro Cassis. Alvan Dame, M.D.      ASSISTANT:  Molli Barrows, PA-C.      ANESTHESIA:  General and Regional.      SPECIMENS:  None.      COMPLICATION:  None.      DRAINS:  None.  EBL: 150cc      TOURNIQUET TIME:   Total Tourniquet Time Documented: Thigh (Right) - 40 minutes Total: Thigh (Right) - 40 minutes  .      The patient was stable to the recovery room.      INDICATION FOR PROCEDURE:  William Ghosh. is a 57 y.o. male patient of   mine.  The patient had been seen, evaluated, and treated for months conservatively in the   office with medication, activity modification, and injections.  The patient had   radiographic changes of bone-on-bone arthritis with endplate sclerosis and osteophytes noted.  Based on the radiographic changes and failed conservative measures, the patient   decided to proceed with definitive treatment, total knee replacement.  Risks of infection, DVT, component failure, need for  revision surgery, neurovascular injury were reviewed in the office setting.  The postop course was reviewed stressing the efforts to maximize post-operative satisfaction and function.  Consent was obtained for benefit of pain   relief.      PROCEDURE IN DETAIL:  The patient was brought to the operative theater.   Once adequate anesthesia, preoperative antibiotics, 3 gm of Ancef,1 gm of Tranexamic Acid, and 10 mg of Decadron administered, the patient was positioned supine with a right thigh tourniquet placed.  The  right lower extremity was prepped and draped in sterile fashion.  A time-   out was performed identifying the patient, planned procedure, and the appropriate extremity.      The right lower extremity was placed in the [facility] leg holder.  The leg was   exsanguinated, tourniquet elevated to 250 mmHg.  A midline incision was  made followed by median parapatellar arthrotomy.  Following initial   exposure, attention was first directed to the patella.  Precut   measurement was noted to be 26 mm.  I resected down to 14 mm and used a   38 anatomic patellar button to restore patellar height as well as cover the cut surface.  Non-united bipartite lateral segment removed at this point     The lug holes were drilled and a metal shim was placed to protect the   patella from retractors and saw blade during the procedure.      At this point, attention was now directed to the femur.  The femoral   canal was opened with a drill, irrigated to try to prevent fat emboli.  An   intramedullary rod was passed at 5 degrees valgus, 10 mm of bone was   resected off the distal femur.  Following this resection, the tibia was   subluxated anteriorly.  Using the extramedullary guide, 2 mm of bone was resected off   the proximal medial tibia.  We confirmed the gap would be   stable medially and laterally with a size 5 spacer block as well as confirmed that the tibial cut was perpendicular in the coronal plane,  checking with an alignment rod.      Once this was done, I sized the femur to be a size 6 in the anterior-   posterior dimension, chose a standard component based on medial and   lateral dimension.  The size 6 rotation block was then pinned in   position anterior referenced using the C-clamp to set rotation.  The   anterior, posterior, and  chamfer cuts were made without difficulty nor   notching making certain that I was along the anterior cortex to help   with flexion gap stability.      The final box cut was made off the lateral aspect of distal femur.      At this point, the tibia was sized to be a size 7.  The size 7 tray was   then pinned in position through the medial third of the tubercle,   drilled, and keel punched.  Trial reduction was now carried with a 6 femur,  7 tibia, a size 6 mm PS insert, and the 38 anatomic patella botton.  The knee was brought to full extension with good flexion stability with the patella   tracking through the trochlea without application of pressure.  Given   all these findings the trial components removed.  Final components were   opened and cement was mixed.  The knee was irrigated with normal saline solution and pulse lavage.  The synovial lining was   then injected with 30 cc of 0.25% Marcaine with epinephrine, 1 cc of Toradol and 30 cc of NS for a total of 61 cc.     Final implants were then cemented onto cleaned and dried cut surfaces of bone with the knee brought to extension with a size 6 mm PS trial insert.      Once the cement had fully cured, excess cement was removed   throughout the knee.  I confirmed that I was satisfied with the range of   motion and stability, and the final size 6 mm PS AOX insert was chosen.  It was   placed into the knee.      The tourniquet had been let down at 40 minutes.  No significant   hemostasis was required.  Due  to history of diabetes I elected to place tobramycin powder into the wound.  We attempted to  mix the initial Tobra dose with the cement however it failed to mix appropriately. The extensor mechanism was then reapproximated using #1 Vicryl and #1 Stratafix sutures with the knee   in flexion.  The   remaining wound was closed with 2-0 Vicryl and running 4-0 Monocryl.   The knee was cleaned, dried, dressed sterilely using Dermabond and   Aquacel dressing.  The patient was then   brought to recovery room in stable condition, tolerating the procedure   well.   Please note that Physician Assistant, Molli Barrows, PA-C was present for the entirety of the case, and was utilized for pre-operative positioning, peri-operative retractor management, general facilitation of the procedure and for primary wound closure at the end of the case.              Pietro Cassis Alvan Dame, M.D.    05/31/2018 9:07 AM

## 2018-05-31 NOTE — Progress Notes (Signed)
Rt setup CPAP for pt. Machine has 2 LPM bleed in O2. Pt has his on mask and tubing.

## 2018-05-31 NOTE — Anesthesia Procedure Notes (Signed)
Procedure Name: Intubation Date/Time: 05/31/2018 7:37 AM Performed by: Pilar Grammes, CRNA Pre-anesthesia Checklist: Patient identified, Emergency Drugs available, Suction available, Patient being monitored and Timeout performed Patient Re-evaluated:Patient Re-evaluated prior to induction Oxygen Delivery Method: Circle system utilized Preoxygenation: Pre-oxygenation with 100% oxygen Induction Type: IV induction Ventilation: Mask ventilation without difficulty Laryngoscope Size: Miller and 2 Grade View: Grade III Tube type: Oral Tube size: 7.5 mm Number of attempts: 2 Airway Equipment and Method: Stylet and Patient positioned with wedge pillow Placement Confirmation: positive ETCO2,  ETT inserted through vocal cords under direct vision,  CO2 detector and breath sounds checked- equal and bilateral Secured at: 22 cm Tube secured with: Tape Dental Injury: Teeth and Oropharynx as per pre-operative assessment  Difficulty Due To: Difficult Airway- due to reduced neck mobility and Difficult Airway- due to large tongue Comments: Easy mask.  2nd attempt successful.  Shoulder roll helpful.

## 2018-05-31 NOTE — Interval H&P Note (Signed)
History and Physical Interval Note:  05/31/2018 7:09 AM  [name]  has presented today for surgery, with the diagnosis of Right knee osteoarthritis  The various methods of treatment have been discussed with the patient and family. After consideration of risks, benefits and other options for treatment, the patient has consented to  Procedure(s) with comments: RIGHT TOTAL KNEE ARTHROPLASTY (Right) - 70 mins as a surgical intervention .  The patient's history has been reviewed, patient examined, no change in status, stable for surgery.  I have reviewed the patient's chart and labs.  Questions were answered to the patient's satisfaction.     Mauri Pole

## 2018-06-01 LAB — BASIC METABOLIC PANEL
Anion gap: 11 (ref 5–15)
BUN: 20 mg/dL (ref 6–20)
CHLORIDE: 98 mmol/L (ref 98–111)
CO2: 24 mmol/L (ref 22–32)
CREATININE: 1.2 mg/dL (ref 0.61–1.24)
Calcium: 8.9 mg/dL (ref 8.9–10.3)
GFR calc Af Amer: >60 mL/min (ref >60)
GFR calc non Af Amer: >60 mL/min (ref >60)
GLUCOSE: 193 mg/dL — AB (ref 70–99)
Potassium: 4.6 mmol/L (ref 3.5–5.1)
SODIUM: 133 mmol/L — AB (ref 135–145)

## 2018-06-01 LAB — CBC
HCT: 43 % (ref 39.0–52.0)
HEMOGLOBIN: 15.2 g/dL (ref 13.0–17.0)
MCH: 32.1 pg (ref 26.0–34.0)
MCHC: 35.3 g/dL (ref 30.0–36.0)
MCV: 90.9 fL (ref 78.0–100.0)
PLATELETS: 184 10*3/uL (ref 150–400)
RBC: 4.73 MIL/uL (ref 4.22–5.81)
RDW: 13.6 % (ref 11.5–15.5)
WBC: 12.1 10*3/uL — ABNORMAL HIGH (ref 4.0–10.5)

## 2018-06-01 LAB — GLUCOSE, CAPILLARY: GLUCOSE-CAPILLARY: 168 mg/dL — AB (ref 70–99)

## 2018-06-01 MED ORDER — METHOCARBAMOL 500 MG PO TABS: 500.0000 mg | ORAL_TABLET | Freq: Four times a day (QID) | ORAL | 0 refills | Status: DC | PRN

## 2018-06-01 MED ORDER — GABAPENTIN 300 MG PO CAPS: 300.0000 mg | ORAL_CAPSULE | Freq: Two times a day (BID) | ORAL | 0 refills | Status: DC

## 2018-06-01 MED ORDER — HYDROCODONE-ACETAMINOPHEN 7.5-325 MG PO TABS: 1.0000 | ORAL_TABLET | ORAL | 0 refills | Status: DC | PRN

## 2018-06-01 MED ORDER — BENAZEPRIL HCL 10 MG PO TABS
40.0000 mg | ORAL_TABLET | Freq: Every day | ORAL | Status: DC
  Administered 2018-06-01: 40 mg via ORAL
  Filled 2018-06-01 (×2): qty 4

## 2018-06-01 MED FILL — GABAPENTIN 300 MG CAPSULE: 300 | 15 days supply | Qty: 30 | Fill #0

## 2018-06-01 MED FILL — METHOCARBAMOL 500 MG TABS: 500 | 10 days supply | Qty: 40 | Fill #0

## 2018-06-01 MED FILL — HYDROCODON-APAP 7.5-325: 7.5-325 | 5 days supply | Qty: 56 | Fill #0

## 2018-06-01 NOTE — Discharge Instructions (Signed)
Dr. [name] Total [facility] Specialist [facility] [address] [place], [place] [address] [phone]  TOTAL KNEE REPLACEMENT POSTOPERATIVE DIRECTIONS  Knee Rehabilitation, Guidelines Following Surgery  Results after knee surgery are often greatly improved when you follow the exercise, range of motion and muscle strengthening exercises prescribed by your doctor. Safety measures are also important to protect the knee from further injury. Any time any of these exercises cause you to have increased pain or swelling in your knee joint, decrease the amount until you are comfortable again and slowly increase them. If you have problems or questions, call your caregiver or physical therapist for advice.   HOME CARE INSTRUCTIONS  Remove items at home which could result in a fall. This includes throw rugs or furniture in walking pathways.   ICE to the affected knee every three hours for 30 minutes at a time and then as needed for pain and swelling.  Continue to use ice on the knee for pain and swelling from surgery. You may notice swelling that will progress down to the foot and ankle.  This is normal after surgery.  Elevate the leg when you are not up walking on it.    Continue to use the breathing machine which will help keep your temperature down.  It is common for your temperature to cycle up and down following surgery, especially at night when you are not up moving around and exerting yourself.  The breathing machine keeps your lungs expanded and your temperature down.  Do not place pillow under knee, focus on keeping the knee straight while resting  DIET You may resume your previous home diet once your are discharged from the hospital.  DRESSING / WOUND CARE / SHOWERING Keep the surgical dressing until follow up.  The dressing is water proof, so you can shower without any extra covering.  IF THE DRESSING FALLS OFF or the wound gets wet inside, change the dressing with  sterile gauze.  Please use good hand washing techniques before changing the dressing.  Do not use any lotions or creams on the incision until instructed by your surgeon.   You may start showering once you are discharged home but do not submerge the incision under water. Just pat the incision dry and apply a dry gauze dressing on daily. Change the surgical dressing daily and reapply a dry dressing each time.  ACTIVITY Walk with your walker as instructed. Use walker as long as suggested by your caregivers. Avoid periods of inactivity such as sitting longer than an hour when not asleep. This helps prevent blood clots.  You may resume a sexual relationship in one month or when given the OK by your doctor.  You may return to work once you are cleared by your doctor.  Do not drive a car for 6 weeks or until released by you surgeon.  Do not drive while taking narcotics.  WEIGHT BEARING Weight bearing as tolerated with assist device (walker, cane, etc) as directed, use it as long as suggested by your surgeon or therapist, typically at least 4-6 weeks.  POSTOPERATIVE CONSTIPATION PROTOCOL Constipation - defined medically as fewer than three stools per week and severe constipation as less than one stool per week.  One of the most common issues patients have following surgery is constipation.  Even if you have a regular bowel pattern at home, your normal regimen is likely to be disrupted due to multiple reasons following surgery.  Combination of anesthesia, postoperative narcotics, change  in appetite and fluid intake all can affect your bowels.  In order to avoid complications following surgery, here are some recommendations in order to help you during your recovery period.  Colace (docusate) - Pick up an over-the-counter form of Colace or another stool softener and take twice a day as long as you are requiring postoperative pain medications.  Take with a full glass of water daily.  If you experience loose  stools or diarrhea, hold the colace until you stool forms back up.  If your symptoms do not get better within 1 week or if they get worse, check with your doctor.  Dulcolax (bisacodyl) - Pick up over-the-counter and take as directed by the product packaging as needed to assist with the movement of your bowels.  Take with a full glass of water.  Use this product as needed if not relieved by Colace only.   MiraLax (polyethylene glycol) - Pick up over-the-counter to have on hand.  MiraLax is a solution that will increase the amount of water in your bowels to assist with bowel movements.  Take as directed and can mix with a glass of water, juice, soda, coffee, or tea.  Take if you go more than two days without a movement. Do not use MiraLax more than once per day. Call your doctor if you are still constipated or irregular after using this medication for 7 days in a row.  If you continue to have problems with postoperative constipation, please contact the office for further assistance and recommendations.  If you experience "the worst abdominal pain ever" or develop nausea or vomiting, please contact the office immediatly for further recommendations for treatment.  ITCHING  If you experience itching with your medications, try taking only a single pain pill, or even half a pain pill at a time.  You can also use Benadryl over the counter for itching or also to help with sleep.   TED HOSE STOCKINGS Wear the elastic stockings on both legs for three weeks following surgery during the day but you may remove then at night for sleeping.  MEDICATIONS See your medication summary on the After Visit Summary that the nursing staff will review with you prior to discharge.  You may have some home medications which will be placed on hold until you complete the course of blood thinner medication.  It is important for you to complete the blood thinner medication as prescribed by your surgeon.  Continue your approved  medications as instructed at time of discharge.  PRECAUTIONS If you experience chest pain or shortness of breath - call 911 immediately for transfer to the hospital emergency department.  If you develop a fever greater that 101 F, purulent drainage from wound, increased redness or drainage from wound, foul odor from the wound/dressing, or calf pain - CONTACT YOUR SURGEON.                                                   FOLLOW-UP APPOINTMENTS Make sure you keep all of your appointments after your operation with your surgeon and caregivers. You should call the office at the above phone number and make an appointment for approximately two weeks after the date of your surgery or on the date instructed by your surgeon outlined in the "After Visit Summary".   RANGE OF MOTION AND STRENGTHENING EXERCISES  Rehabilitation of the knee is important following a knee injury or an operation. After just a few days of immobilization, the muscles of the thigh which control the knee become weakened and shrink (atrophy). Knee exercises are designed to build up the tone and strength of the thigh muscles and to improve knee motion. Often times heat used for twenty to thirty minutes before working out will loosen up your tissues and help with improving the range of motion but do not use heat for the first two weeks following surgery. These exercises can be done on a training (exercise) mat, on the floor, on a table or on a bed. Use what ever works the best and is most comfortable for you Knee exercises include:  Leg Lifts - While your knee is still immobilized in a splint or cast, you can do straight leg raises. Lift the leg to 60 degrees, hold for 3 sec, and slowly lower the leg. Repeat 10-20 times 2-3 times daily. Perform this exercise against resistance later as your knee gets better.  Quad and Hamstring Sets - Tighten up the muscle on the front of the thigh (Quad) and hold for 5-10 sec. Repeat this 10-20 times hourly.  Hamstring sets are done by pushing the foot backward against an object and holding for 5-10 sec. Repeat as with quad sets.   Leg Slides: Lying on your back, slowly slide your foot toward your buttocks, bending your knee up off the floor (only go as far as is comfortable). Then slowly slide your foot back down until your leg is flat on the floor again.  Angel Wings: Lying on your back spread your legs to the side as far apart as you can without causing discomfort.  A rehabilitation program following serious knee injuries can speed recovery and prevent re-injury in the future due to weakened muscles. Contact your doctor or a physical therapist for more information on knee rehabilitation.   IF YOU ARE TRANSFERRED TO A SKILLED REHAB FACILITY If the patient is transferred to a skilled rehab facility following release from the hospital, a list of the current medications will be sent to the facility for the patient to continue.  When discharged from the skilled rehab facility, please have the facility set up the patient's Rhome prior to being released. Also, the skilled facility will be responsible for providing the patient with their medications at time of release from the facility to include their pain medication, the muscle relaxants, and their blood thinner medication. If the patient is still at the rehab facility at time of the two week follow up appointment, the skilled rehab facility will also need to assist the patient in arranging follow up appointment in our office and any transportation needs.  MAKE SURE YOU:  Understand these instructions.  Get help right away if you are not doing well or get worse.    Pick up stool softner and laxative for home use following surgery while on pain medications. Do not submerge incision under water. Please use good hand washing techniques while changing dressing each day. May shower starting three days after surgery. Please use a clean  towel to pat the incision dry following showers. Continue to use ice for pain and swelling after surgery. Do not use any lotions or creams on the incision until instructed by your surgeon.

## 2018-06-01 NOTE — Progress Notes (Signed)
Physical Therapy Treatment Patient Details Name: [name]. MRN: [id number] DOB: Oct 29, 1960 Today's Date: 06/01/2018    History of Present Illness Pt is a 57 YO male s/p R TKR on 05/31/18. PMH includes tubular adenoma of colon 2019, BCE, DM II with neuropathy, permanant afib, polycythemia vera 2016, HLD, HTN TIA, GERD, obesity, OSA, cardioversion, L THA 2012.    PT Comments    POD # 1 pm session Issued HEP and performed all TE's with instruction on proper tech, freq as well as use of ICE.  Addressed all mobility questions. Pt ready for D/C to home.    Follow Up Recommendations  Follow surgeon's recommendation for DC plan and follow-up therapies;Supervision for mobility/OOB     Equipment Recommendations  None recommended by PT    Recommendations for Other Services       Precautions / Restrictions Precautions Precautions: Fall Restrictions Weight Bearing Restrictions: No Other Position/Activity Restrictions: WBAT        Balance                                            Cognition Arousal/Alertness: Awake/alert Behavior During Therapy: WFL for tasks assessed/performed Overall Cognitive Status: Within Functional Limits for tasks assessed                                        Exercises   Total Knee Replacement TE's 10 reps B LE ankle pumps 10 reps towel squeezes 10 reps knee presses 10 reps heel slides  10 reps SAQ's 10 reps SLR's 10 reps ABD Followed by ICE     General Comments        Pertinent Vitals/Pain Pain Assessment: 0-10 Pain Score: 2  Pain Location: R knee  Pain Descriptors / Indicators: Aching Pain Intervention(s): Monitored during session;Repositioned;Ice applied    Home Living                      Prior Function            PT Goals (current goals can now be found in the care plan section) Progress towards PT goals: Progressing toward goals    Frequency    7X/week      PT Plan  Current plan remains appropriate    Co-evaluation              AM-PAC PT "6 Clicks" Daily Activity  Outcome Measure  Difficulty turning over in bed (including adjusting bedclothes, sheets and blankets)?: A Little Difficulty moving from lying on back to sitting on the side of the bed? : A Little Difficulty sitting down on and standing up from a chair with arms (e.g., wheelchair, bedside commode, etc,.)?: A Little Help needed moving to and from a bed to chair (including a wheelchair)?: A Little Help needed walking in hospital room?: A Little Help needed climbing 3-5 steps with a railing? : A Little 6 Click Score: 18    End of Session Equipment Utilized During Treatment: Gait belt Activity Tolerance: Patient tolerated treatment well Patient left: in chair;with chair alarm set;with call bell/phone within reach;with family/visitor present Nurse Communication: Mobility status PT Visit Diagnosis: Difficulty in walking, not elsewhere classified (R26.2);Other abnormalities of gait and mobility (R26.89)     Time: 5732-2025 PT Time Calculation (min) (  ACUTE ONLY): 32 min  Charges:   $Therapeutic Exercise: 8-22 mins $Self Care/Home Management: 8-22                     Rica Koyanagi  PTA Acute  Rehabilitation Services Pager      [phone] Office      [phone]

## 2018-06-01 NOTE — Discharge Summary (Signed)
Physician Discharge Summary   Patient ID: [name]. MRN: [id number] DOB/AGE: Jan 21, 1961 57 y.o.  Admit date: 05/31/2018 Discharge date: 06/01/2018  Primary Diagnosis: Primary osteoarthritis right knee  Admission Diagnoses:  Past Medical History:  Diagnosis Date  . Arthritis   . Diverticulosis   . Dyslipidemia   . GERD (gastroesophageal reflux disease)   . Herpes labialis   . Hypertension   . Longstanding persistent atrial fibrillation   . Metabolic syndrome   . Obesity   . Proteinuria   . Psoriasis   . Seborrheic dermatitis   . Sleep apnea    very compliant with CPAP  . TIA (transient ischemic attack) 2009   Discharge Diagnoses:   Active Problems:   S/P TKR (total knee replacement), right  Estimated body mass index is 40.57 kg/m as calculated from the following:   Height as of this encounter: 5' 9.75" (1.772 m).   Weight as of this encounter: 127.3 kg.  Procedure:  Procedure(s) (LRB): RIGHT TOTAL KNEE ARTHROPLASTY (Right)   Consults: None  HPI: [name]., 57 y.o. male, has a history of pain and functional disability in the right knee due to arthritis and has failed non-surgical conservative treatments for greater than 12 weeks to include NSAID's and/or analgesics and activity modification.  Onset of symptoms was gradual, starting >10 years ago with gradually worsening course since that time. The patient noted no past surgery on the right knee(s).  Patient currently rates pain in the right knee(s) at 7 out of 10 with activity. Patient has worsening of pain with activity and weight bearing, pain that interferes with activities of daily living, pain with passive range of motion, crepitus and joint swelling.  Patient has evidence of periarticular osteophytes and joint space narrowing by imaging studies.  There is no active infection.   Risks, benefits and expectations were discussed with the patient.  Risks including but not limited to the risk of anesthesia,  blood clots, nerve damage, blood vessel damage, failure of the prosthesis, infection and up to and including death.  Patient understand the risks, benefits and expectations and wishes to proceed with surgery.   Laboratory Data: Admission on 05/31/2018, Discharged on 06/01/2018  Component Date Value Ref Range Status  . Glucose-Capillary 05/31/2018 144* 70 - 99 mg/dL Final  . Comment 1 05/31/2018 Notify RN   Final  . Comment 2 05/31/2018 Document in Chart   Final  . Glucose-Capillary 05/31/2018 210* 70 - 99 mg/dL Final  . Glucose-Capillary 05/31/2018 205* 70 - 99 mg/dL Final  . WBC 06/01/2018 12.1* 4.0 - 10.5 K/uL Final  . RBC 06/01/2018 4.73  4.22 - 5.81 MIL/uL Final  . Hemoglobin 06/01/2018 15.2  13.0 - 17.0 g/dL Final  . HCT 06/01/2018 43.0  39.0 - 52.0 % Final  . MCV 06/01/2018 90.9  78.0 - 100.0 fL Final  . MCH 06/01/2018 32.1  26.0 - 34.0 pg Final  . MCHC 06/01/2018 35.3  30.0 - 36.0 g/dL Final  . RDW 06/01/2018 13.6  11.5 - 15.5 % Final  . Platelets 06/01/2018 184  150 - 400 K/uL Final   Performed at [facility], [place] [address] [place], [place] [address]  . Sodium 06/01/2018 133* 135 - 145 mmol/L Final  . Potassium 06/01/2018 4.6  3.5 - 5.1 mmol/L Final  . Chloride 06/01/2018 98  98 - 111 mmol/L Final  . CO2 06/01/2018 24  22 - 32 mmol/L Final  . Glucose, Bld 06/01/2018 193* 70 -  99 mg/dL Final  . BUN 06/01/2018 20  6 - 20 mg/dL Final  . Creatinine, Ser 06/01/2018 1.20  0.61 - 1.24 mg/dL Final  . Calcium 06/01/2018 8.9  8.9 - 10.3 mg/dL Final  . GFR calc non Af Amer 06/01/2018 >60  >60 mL/min Final  . GFR calc Af Amer 06/01/2018 >60  >60 mL/min Final   Comment: (NOTE) The eGFR has been calculated using the CKD EPI equation. This calculation has not been validated in all clinical situations. eGFR's persistently <60 mL/min signify possible Chronic Kidney Disease.   Georgiann Hahn gap 06/01/2018 11  5 - 15 Final   Performed at [facility], [place] [address], [place], [place] [address]  . Glucose-Capillary 05/31/2018 201* 70 - 99 mg/dL Final  . Glucose-Capillary 05/31/2018 215* 70 - 99 mg/dL Final  . Comment 1 05/31/2018 Notify RN   Final  . Comment 2 05/31/2018 Document in Chart   Final  . Glucose-Capillary 06/01/2018 168* 70 - 99 mg/dL Final  Hospital Outpatient Visit on 05/25/2018  Component Date Value Ref Range Status  . MRSA, PCR 05/25/2018 NEGATIVE  NEGATIVE Final  . Staphylococcus aureus 05/25/2018 NEGATIVE  NEGATIVE Final   Comment: (NOTE) The Xpert SA Assay (FDA approved for NASAL specimens in patients 3 years of age and older), is one component of a comprehensive surveillance program. It is not intended to diagnose infection nor to guide or monitor treatment. Performed at [facility], [place] [address] [place], [place] [address]   . ABO/RH(D) 05/25/2018 O POS   Final  . Antibody Screen 05/25/2018 NEG   Final  . Sample Expiration 05/25/2018 06/03/2018   Final  . Extend sample reason 05/25/2018    Final                   Value:NO TRANSFUSIONS OR PREGNANCY IN THE PAST 3 MONTHS Performed at [facility], [place] [address], [place], [place] [address]   . Glucose-Capillary 05/25/2018 135* 70 - 99 mg/dL Final  Appointment on 05/23/2018  Component Date Value Ref Range Status  . Rest HR 05/23/2018 83  bpm Final  . Rest BP 05/23/2018 118/92  mmHg Final  . Peak HR 05/23/2018 93  bpm Final  . Peak BP 05/23/2018 125/85  mmHg Final  . SSS 05/23/2018 6   Final  . SRS 05/23/2018 4   Final  . SDS 05/23/2018 2   Final  . LHR 05/23/2018 0.37   Final  . TID 05/23/2018 1.09   Final  . LV sys vol 05/23/2018 168  mL Final  . LV dias vol 05/23/2018 232  62 - 150 mL Final  Office Visit on 05/06/2018  Component Date Value Ref Range Status  . WBC 05/06/2018 8.4  3.4 - 10.8 x10E3/uL Final  . RBC 05/06/2018 5.64  4.14 - 5.80 x10E6/uL Final  . Hemoglobin 05/06/2018 18.5* 13.0 - 17.7 g/dL Final  .  Hematocrit 05/06/2018 50.3  37.5 - 51.0 % Final  . MCV 05/06/2018 89  79 - 97 fL Final  . MCH 05/06/2018 32.8  26.6 - 33.0 pg Final  . MCHC 05/06/2018 36.8* 31.5 - 35.7 g/dL Final  . RDW 05/06/2018 13.8  12.3 - 15.4 % Final  . Platelets 05/06/2018 215  150 - 450 x10E3/uL Final  . Hematology Comments: 05/06/2018 Note:   Final   Verified by microscopic examination.  . Glucose 05/06/2018 130* 65 - 99 mg/dL Final  . BUN 05/06/2018 18  6 - 24 mg/dL Final  . Creatinine, Ser 05/06/2018 1.25  0.76 - 1.27 mg/dL Final  . GFR calc non Af Amer 05/06/2018 63  >59 mL/min/1.73 Final  . GFR calc Af Amer 05/06/2018 73  >59 mL/min/1.73 Final  . BUN/Creatinine Ratio 05/06/2018 14  9 - 20 Final  . Sodium 05/06/2018 138  134 - 144 mmol/L Final  . Potassium 05/06/2018 4.6  3.5 - 5.2 mmol/L Final  . Chloride 05/06/2018 101  96 - 106 mmol/L Final  . CO2 05/06/2018 19* 20 - 29 mmol/L Final  . Calcium 05/06/2018 10.3* 8.7 - 10.2 mg/dL Final      EKG: Orders placed or performed in visit on 05/06/18  . EKG 12-Lead     Hospital Course: Corrigan Kretschmer. is a 57 y.o. who was admitted to [facility]. They were brought to the operating room on 05/31/2018 and underwent Procedure(s): RIGHT TOTAL KNEE ARTHROPLASTY.  Patient tolerated the procedure well and was later transferred to the recovery room and then to the orthopaedic floor for postoperative care.  They were given PO and IV analgesics for pain control following their surgery.  They were given 24 hours of postoperative antibiotics of  Anti-infectives (From admission, onward)   Start     Dose/Rate Route Frequency Ordered Stop   05/31/18 1400  ceFAZolin (ANCEF) IVPB 2g/100 mL premix     2 g 200 mL/hr over 30 Minutes Intravenous Every 6 hours 05/31/18 1056 05/31/18 2028   05/31/18 0856  tobramycin (NEBCIN) powder  Status:  Discontinued       As needed 05/31/18 0857 05/31/18 0932   05/31/18 0828  tobramycin (NEBCIN) powder  Status:  Discontinued        As needed 05/31/18 0828 05/31/18 0932   05/31/18 0600  ceFAZolin (ANCEF) 3 g in dextrose 5 % 50 mL IVPB     3 g 100 mL/hr over 30 Minutes Intravenous On call to O.R. 05/30/18 1155 05/31/18 0740     and started on DVT prophylaxis in the form of Xarelto.   PT and OT were ordered for total joint protocol.  Discharge planning consulted to help with postop disposition and equipment needs.  Patient had a good night on the evening of surgery.  They started to get up OOB with therapy on day one. The patient had progressed with therapy and meeting their goals.  Incision was healing well.  Patient was seen in rounds and was ready to go home.   Diet: Cardiac diet and diabetic diet Activity:WBAT Follow-up:in 2 weeks Disposition - Home Discharged Condition: stable   Discharge Instructions    Call MD / Call 911   Complete by:  As directed    If you experience chest pain or shortness of breath, CALL 911 and be transported to the hospital emergency room.  If you develope a fever above 101 F, pus (white drainage) or increased drainage or redness at the wound, or calf pain, call your surgeon's office.   Constipation Prevention   Complete by:  As directed    Drink plenty of fluids.  Prune juice may be helpful.  You may use a stool softener, such as Colace (over the counter) 100 mg twice a day.  Use MiraLax (over the counter) for constipation as needed.   Diet - low sodium heart healthy   Complete by:  As directed    Diet Carb Modified   Complete by:  As directed    Discharge instructions  Complete by:  As directed    Dr. [name] Total Joint Specialist [facility] [address] [place], [place] [address] [phone]  TOTAL KNEE REPLACEMENT POSTOPERATIVE DIRECTIONS  Knee Rehabilitation, Guidelines Following Surgery  Results after knee surgery are often greatly improved when you follow the exercise, range of motion and muscle strengthening exercises prescribed by your doctor.  Safety measures are also important to protect the knee from further injury. Any time any of these exercises cause you to have increased pain or swelling in your knee joint, decrease the amount until you are comfortable again and slowly increase them. If you have problems or questions, call your caregiver or physical therapist for advice.   HOME CARE INSTRUCTIONS  Remove items at home which could result in a fall. This includes throw rugs or furniture in walking pathways.  ICE to the affected knee every three hours for 30 minutes at a time and then as needed for pain and swelling.  Continue to use ice on the knee for pain and swelling from surgery. You may notice swelling that will progress down to the foot and ankle.  This is normal after surgery.  Elevate the leg when you are not up walking on it.   Continue to use the breathing machine which will help keep your temperature down.  It is common for your temperature to cycle up and down following surgery, especially at night when you are not up moving around and exerting yourself.  The breathing machine keeps your lungs expanded and your temperature down. Do not place pillow under knee, focus on keeping the knee straight while resting  DIET You may resume your previous home diet once your are discharged from the hospital.  DRESSING / WOUND CARE / SHOWERING Keep the surgical dressing until follow up.  The dressing is water proof, so you can shower without any extra covering.  IF THE DRESSING FALLS OFF or the wound gets wet inside, change the dressing with sterile gauze.  Please use good hand washing techniques before changing the dressing.  Do not use any lotions or creams on the incision until instructed by your surgeon.   You may start showering once you are discharged home but do not submerge the incision under water. Just pat the incision dry and apply a dry gauze dressing on daily. Change the surgical dressing daily and reapply a dry dressing each  time.  ACTIVITY Walk with your walker as instructed. Use walker as long as suggested by your caregivers. Avoid periods of inactivity such as sitting longer than an hour when not asleep. This helps prevent blood clots.  You may resume a sexual relationship in one month or when given the OK by your doctor.  You may return to work once you are cleared by your doctor.  Do not drive a car for 6 weeks or until released by you surgeon.  Do not drive while taking narcotics.  WEIGHT BEARING Weight bearing as tolerated with assist device (walker, cane, etc) as directed, use it as long as suggested by your surgeon or therapist, typically at least 4-6 weeks.  POSTOPERATIVE CONSTIPATION PROTOCOL Constipation - defined medically as fewer than three stools per week and severe constipation as less than one stool per week.  One of the most common issues patients have following surgery is constipation.  Even if you have a regular bowel pattern at home, your normal regimen is likely to be disrupted due to multiple reasons following surgery.  Combination  of anesthesia, postoperative narcotics, change in appetite and fluid intake all can affect your bowels.  In order to avoid complications following surgery, here are some recommendations in order to help you during your recovery period.  Colace (docusate) - Pick up an over-the-counter form of Colace or another stool softener and take twice a day as long as you are requiring postoperative pain medications.  Take with a full glass of water daily.  If you experience loose stools or diarrhea, hold the colace until you stool forms back up.  If your symptoms do not get better within 1 week or if they get worse, check with your doctor.  Dulcolax (bisacodyl) - Pick up over-the-counter and take as directed by the product packaging as needed to assist with the movement of your bowels.  Take with a full glass of water.  Use this product as needed if not relieved by Colace  only.   MiraLax (polyethylene glycol) - Pick up over-the-counter to have on hand.  MiraLax is a solution that will increase the amount of water in your bowels to assist with bowel movements.  Take as directed and can mix with a glass of water, juice, soda, coffee, or tea.  Take if you go more than two days without a movement. Do not use MiraLax more than once per day. Call your doctor if you are still constipated or irregular after using this medication for 7 days in a row.  If you continue to have problems with postoperative constipation, please contact the office for further assistance and recommendations.  If you experience "the worst abdominal pain ever" or develop nausea or vomiting, please contact the office immediatly for further recommendations for treatment.  ITCHING  If you experience itching with your medications, try taking only a single pain pill, or even half a pain pill at a time.  You can also use Benadryl over the counter for itching or also to help with sleep.   TED HOSE STOCKINGS Wear the elastic stockings on both legs for three weeks following surgery during the day but you may remove then at night for sleeping.  MEDICATIONS See your medication summary on the "After Visit Summary" that the nursing staff will review with you prior to discharge.  You may have some home medications which will be placed on hold until you complete the course of blood thinner medication.  It is important for you to complete the blood thinner medication as prescribed by your surgeon.  Continue your approved medications as instructed at time of discharge.  PRECAUTIONS If you experience chest pain or shortness of breath - call 911 immediately for transfer to the hospital emergency department.  If you develop a fever greater that 101 F, purulent drainage from wound, increased redness or drainage from wound, foul odor from the wound/dressing, or calf pain - CONTACT YOUR SURGEON.                                                    FOLLOW-UP APPOINTMENTS Make sure you keep all of your appointments after your operation with your surgeon and caregivers. You should call the office at the above phone number and make an appointment for approximately two weeks after the date of your surgery or on the date instructed by your surgeon outlined in the "After Visit Summary".   RANGE OF  MOTION AND STRENGTHENING EXERCISES  Rehabilitation of the knee is important following a knee injury or an operation. After just a few days of immobilization, the muscles of the thigh which control the knee become weakened and shrink (atrophy). Knee exercises are designed to build up the tone and strength of the thigh muscles and to improve knee motion. Often times heat used for twenty to thirty minutes before working out will loosen up your tissues and help with improving the range of motion but do not use heat for the first two weeks following surgery. These exercises can be done on a training (exercise) mat, on the floor, on a table or on a bed. Use what ever works the best and is most comfortable for you Knee exercises include:  Leg Lifts - While your knee is still immobilized in a splint or cast, you can do straight leg raises. Lift the leg to 60 degrees, hold for 3 sec, and slowly lower the leg. Repeat 10-20 times 2-3 times daily. Perform this exercise against resistance later as your knee gets better.  Quad and Hamstring Sets - Tighten up the muscle on the front of the thigh (Quad) and hold for 5-10 sec. Repeat this 10-20 times hourly. Hamstring sets are done by pushing the foot backward against an object and holding for 5-10 sec. Repeat as with quad sets.  Leg Slides: Lying on your back, slowly slide your foot toward your buttocks, bending your knee up off the floor (only go as far as is comfortable). Then slowly slide your foot back down until your leg is flat on the floor again. Angel Wings: Lying on your back spread your legs to  the side as far apart as you can without causing discomfort.  A rehabilitation program following serious knee injuries can speed recovery and prevent re-injury in the future due to weakened muscles. Contact your doctor or a physical therapist for more information on knee rehabilitation.   IF YOU ARE TRANSFERRED TO A SKILLED REHAB FACILITY If the patient is transferred to a skilled rehab facility following release from the hospital, a list of the current medications will be sent to the facility for the patient to continue.  When discharged from the skilled rehab facility, please have the facility set up the patient's [place] prior to being released. Also, the skilled facility will be responsible for providing the patient with their medications at time of release from the facility to include their pain medication, the muscle relaxants, and their blood thinner medication. If the patient is still at the rehab facility at time of the two week follow up appointment, the skilled rehab facility will also need to assist the patient in arranging follow up appointment in our office and any transportation needs.  MAKE SURE YOU:  Understand these instructions.  Get help right away if you are not doing well or get worse.    Pick up stool softner and laxative for home use following surgery while on pain medications. Do not submerge incision under water. Please use good hand washing techniques while changing dressing each day. May shower starting three days after surgery. Please use a clean towel to pat the incision dry following showers. Continue to use ice for pain and swelling after surgery. Do not use any lotions or creams on the incision until instructed by your surgeon.   Increase activity slowly as tolerated   Complete by:  As directed      Allergies as of 06/01/2018  Reactions   Morphine And Related    Hallucinations      Medication List    STOP taking these medications    enoxaparin 120 MG/0.8ML injection Commonly known as:  LOVENOX     TAKE these medications   ACCU-CHEK FASTCLIX LANCETS Misc CHECK BLOOD SUGAR TWICE A DAY   ACCU-CHEK GUIDE test strip Generic drug:  glucose blood TEST BLOOD SUGAR TWICE A DAY   acetaminophen 500 MG tablet Commonly known as:  TYLENOL Take 1,000 mg by mouth daily as needed for moderate pain.   benazepril 40 MG tablet Commonly known as:  LOTENSIN Take 1 tablet (40 mg total) by mouth daily.   clobetasol 0.05 % external solution Commonly known as:  TEMOVATE Apply 1 application topically 2 (two) times daily as needed (psoriasis).   diltiazem 240 MG 24 hr capsule Commonly known as:  CARDIZEM CD Take 1 capsule (240 mg total) by mouth daily.   Dulaglutide 0.75 MG/0.5ML Sopn Inject 1 applicator into the skin once a week. Notes to patient:  Take as prescribed   gabapentin 300 MG capsule Commonly known as:  NEURONTIN Take 1 capsule (300 mg total) by mouth 2 (two) times daily.   HYDROcodone-acetaminophen 7.5-325 MG tablet Commonly known as:  NORCO Take 1-2 tablets by mouth every 4 (four) hours as needed for severe pain (pain score 7-10).   Ixekizumab 80 MG/ML Soaj Inject 80 mg into the skin every 30 (thirty) days. Notes to patient:  Take as prescribed   metFORMIN 500 MG 24 hr tablet Commonly known as:  GLUCOPHAGE-XR TAKE ONE TABLET BY MOUTH TWICE DAILY   methocarbamol 500 MG tablet Commonly known as:  ROBAXIN Take 1 tablet (500 mg total) by mouth every 6 (six) hours as needed for muscle spasms.   oxymetazoline 0.05 % nasal spray Commonly known as:  AFRIN Place 1 spray into both nostrils at bedtime.   pantoprazole 40 MG tablet Commonly known as:  PROTONIX Take 1 tablet (40 mg total) by mouth every other day. Notes to patient:  Take as prescribed   rosuvastatin 10 MG tablet Commonly known as:  CRESTOR Take 1 tablet (10 mg total) by mouth daily.   ULTICARE ALCOHOL SWABS 70 % Pads APPLY 2 TIMES  DAILY.   Vitamin D (Cholecalciferol) 1000 units Caps Take 1,000 Units by mouth daily.   XARELTO 20 MG Tabs tablet Generic drug:  rivaroxaban TAKE 1 TABLET BY MOUTH DAILY WITH SUPPER. What changed:  See the new instructions.      Follow-up Information    [name], MD. Schedule an appointment as soon as possible for a visit in 2 week(s).   Specialty:  Orthopedic Surgery Contact information: [address] [place] [address] [phone]           Signed: Ardeen Jourdain, PA-C Orthopaedic Surgery 06/01/2018, 2:51 PM

## 2018-06-01 NOTE — Progress Notes (Signed)
Physical Therapy Treatment Patient Details Name: [name]. MRN: [id number] DOB: Jun 29, 1961 Today's Date: 06/01/2018    History of Present Illness Pt is a 57 YO male s/p R TKR on 05/31/18. PMH includes tubular adenoma of colon 2019, BCE, DM II with neuropathy, permanant afib, polycythemia vera 2016, HLD, HTN TIA, GERD, obesity, OSA, cardioversion, L THA 2012.    PT Comments    POD # 1 am session Assisted with amb a greater distance in hallway and practiced 3 different stair situations.   Pt will need one more PT session to address TE's.   Follow Up Recommendations  Follow surgeon's recommendation for DC plan and follow-up therapies;Supervision for mobility/OOB     Equipment Recommendations  None recommended by PT    Recommendations for Other Services       Precautions / Restrictions Precautions Precautions: Fall Restrictions Weight Bearing Restrictions: No Other Position/Activity Restrictions: WBAT     Mobility  Bed Mobility               General bed mobility comments: OOB in recliner  Transfers Overall transfer level: Needs assistance Equipment used: Rolling walker (2 wheeled) Transfers: Sit to/from Stand              Ambulation/Gait Ambulation/Gait assistance: Supervision;Min guard Gait Distance (Feet): 110 Feet Assistive device: Rolling walker (2 wheeled) Gait Pattern/deviations: Step-to pattern;Decreased stride length;Decreased stance time - right;Decreased weight shift to right Gait velocity: decr    General Gait Details: Min guard for safety. Verbal cuing for sequencing, placement in RW.    Stairs Stairs: Yes Stairs assistance: Supervision;Min guard Stair Management: Step to pattern;Forwards Number of Stairs: 8 General stair comments: practiced one step with walker and multiple steps with one rail/one crutch and with 2 rails.  Spouse present for all and "hands on" assisted pt under therapist direction.    Wheelchair Mobility     Modified Rankin (Stroke Patients Only)       Balance                                            Cognition Arousal/Alertness: Awake/alert Behavior During Therapy: WFL for tasks assessed/performed Overall Cognitive Status: Within Functional Limits for tasks assessed                                        Exercises      General Comments        Pertinent Vitals/Pain Pain Assessment: 0-10 Pain Score: 2  Pain Location: R knee  Pain Descriptors / Indicators: Aching Pain Intervention(s): Monitored during session;Repositioned;Ice applied    Home Living                      Prior Function            PT Goals (current goals can now be found in the care plan section) Progress towards PT goals: Progressing toward goals    Frequency    7X/week      PT Plan Current plan remains appropriate    Co-evaluation              AM-PAC PT "6 Clicks" Daily Activity  Outcome Measure  Difficulty turning over in bed (including adjusting bedclothes, sheets and blankets)?: A Little Difficulty moving from  lying on back to sitting on the side of the bed? : A Little Difficulty sitting down on and standing up from a chair with arms (e.g., wheelchair, bedside commode, etc,.)?: A Little Help needed moving to and from a bed to chair (including a wheelchair)?: A Little Help needed walking in hospital room?: A Little Help needed climbing 3-5 steps with a railing? : A Little 6 Click Score: 18    End of Session Equipment Utilized During Treatment: Gait belt Activity Tolerance: Patient tolerated treatment well Patient left: in chair;with chair alarm set;with call bell/phone within reach;with family/visitor present Nurse Communication: Mobility status PT Visit Diagnosis: Difficulty in walking, not elsewhere classified (R26.2);Other abnormalities of gait and mobility (R26.89)     Time: 1962-2297 PT Time Calculation (min) (ACUTE ONLY): 30  min  Charges:  $Gait Training: 8-22 mins $Therapeutic Activity: 8-22 mins                     Rica Koyanagi  PTA Acute  Rehabilitation Services Pager      [phone] Office      [phone]

## 2018-06-01 NOTE — Plan of Care (Signed)
Pt alert and oriented, doing well this am. Pain well controlled. Plan to d./c home per MD order.

## 2018-06-01 NOTE — Progress Notes (Signed)
   Subjective: 1 Day Post-Op Procedure(s) (LRB): RIGHT TOTAL KNEE ARTHROPLASTY (Right) Patient reports pain as mild.   Patient seen in rounds with Dr. Alvan Dame. Patient is well, and has had no acute complaints or problems. Has some discomfort in the right knee. No issues overnight. No SOB or chest pain.  Plan is to go Home after hospital stay.  Objective: Vital signs in last 24 hours: Temp:  [97.5 F (36.4 C)-98.8 F (37.1 C)] 98.6 F (37 C) (10/02 0117) Pulse Rate:  [38-81] 78 (10/02 0117) Resp:  [12-20] 20 (10/01 2159) BP: (133-159)/(75-109) 141/98 (10/02 0117) SpO2:  [96 %-100 %] 99 % (10/02 0117)  Intake/Output from previous day:  Intake/Output Summary (Last 24 hours) at 06/01/2018 0736 Last data filed at 06/01/2018 3614 Gross per 24 hour  Intake 2829.05 ml  Output 1715 ml  Net 1114.05 ml     Labs: Recent Labs    06/01/18 0401  HGB 15.2   Recent Labs    06/01/18 0401  WBC 12.1*  RBC 4.73  HCT 43.0  PLT 184   Recent Labs    06/01/18 0401  NA 133*  K 4.6  CL 98  CO2 24  BUN 20  CREATININE 1.20  GLUCOSE 193*  CALCIUM 8.9    EXAM General - Patient is Alert and Oriented Extremity - Neurologically intact Intact pulses distally Dorsiflexion/Plantar flexion intact No cellulitis present Compartment soft Dressing - dressing C/D/I Motor Function - intact, moving foot and toes well on exam.    Past Medical History:  Diagnosis Date  . Arthritis   . Diverticulosis   . Dyslipidemia   . GERD (gastroesophageal reflux disease)   . Herpes labialis   . Hypertension   . Longstanding persistent atrial fibrillation   . Metabolic syndrome   . Obesity   . Proteinuria   . Psoriasis   . Seborrheic dermatitis   . Sleep apnea    very compliant with CPAP  . TIA (transient ischemic attack) 2009    Assessment/Plan: 1 Day Post-Op Procedure(s) (LRB): RIGHT TOTAL KNEE ARTHROPLASTY (Right) Active Problems:   S/P TKR (total knee replacement), right  Estimated  body mass index is 40.57 kg/m as calculated from the following:   Height as of this encounter: 5' 9.75" (1.772 m).   Weight as of this encounter: 127.3 kg. Advance diet Up with therapy D/C IV fluids  DVT Prophylaxis - Xarelto Weight-Bearing as tolerated   We will continue to work with therapy today. Plan for DC home today with outpatient therapy. Follow up in 2 weeks. Discharge instructions given.   Ardeen Jourdain, PA-C Orthopaedic Surgery 06/01/2018, 7:36 AM

## 2018-06-02 ENCOUNTER — Other Ambulatory Visit: Payer: Self-pay | Admitting: *Deleted

## 2018-06-02 ENCOUNTER — Encounter (HOSPITAL_COMMUNITY): Payer: Self-pay | Admitting: Orthopedic Surgery

## 2018-06-02 NOTE — Patient Outreach (Signed)
[facility]) Care Management  06/02/2018  Kylyn Sookram. 1961-04-28 [id number]   Subjective: Telephone call to patient's home number, no answer, left HIPAA compliant voicemail message, and requested call back.    Objective:  Per KPN (Knowledge Performance Now, point of care tool) and chart review, patient hospitalized  10/1/9 - 06/01/18 for  Right knee osteoarthritis, status post RIGHT TOTAL KNEE ARTHROPLASTY at [facility] on 05/31/18.   Patient also has a history of Arthritis, Diverticulosis, diabetes, hypertension, Dyslipidemia, Longstanding persistent atrial fibrillation, Metabolic syndrome, Psoriasis, Seborrheic dermatitis, Sleep apnea (CPAP) , and TIA (transient ischemic attack).     Patent transitioned from [place] to Wellness program for diabetes management to [facility] on 08/04/17.         Assessment:  Received UMR Preoperative / Transition of care referral on 05/20/18. Preoperative call completed, and Transition of care follow up pending patient contact.      Plan: RNCM will send unsuccessful outreach  letter, [facility] pamphlet, will call patient for 2nd telephone outreach attempt, transition of care follow up, and proceed with case closure, within 10 business days if no return call.          Natesha Hassey H. Annia Friendly, BSN, [place] Management [facility] Telephonic CM Phone: [phone] Fax: [phone]

## 2018-06-02 NOTE — Anesthesia Postprocedure Evaluation (Signed)
Anesthesia Post Note  Patient: [name].  Procedure(s) Performed: RIGHT TOTAL KNEE ARTHROPLASTY (Right Knee)     Patient location during evaluation: PACU Anesthesia Type: Regional and General Level of consciousness: awake and alert Pain management: pain level controlled Vital Signs Assessment: post-procedure vital signs reviewed and stable Respiratory status: spontaneous breathing, nonlabored ventilation, respiratory function stable and patient connected to nasal cannula oxygen Cardiovascular status: blood pressure returned to baseline and stable Postop Assessment: no apparent nausea or vomiting Anesthetic complications: no    Last Vitals:  Vitals:   06/01/18 0117 06/01/18 1034  BP: (!) 141/98 (!) 139/97  Pulse: 78   Resp:  18  Temp: 37 C 36.9 C  SpO2: 99% 97%    Last Pain:  Vitals:   06/01/18 1034  TempSrc: Oral  PainSc: 7                  Ousman Dise

## 2018-06-03 DIAGNOSIS — M25661 Stiffness of right knee, not elsewhere classified: Secondary | ICD-10-CM | POA: Diagnosis not present

## 2018-06-06 ENCOUNTER — Ambulatory Visit: Payer: Self-pay | Admitting: *Deleted

## 2018-06-06 DIAGNOSIS — M25661 Stiffness of right knee, not elsewhere classified: Secondary | ICD-10-CM | POA: Diagnosis not present

## 2018-06-07 MED FILL — HYDROCODON-APAP 7.5-325: 7.5-325 | 8 days supply | Qty: 60 | Fill #0

## 2018-06-08 DIAGNOSIS — M25661 Stiffness of right knee, not elsewhere classified: Secondary | ICD-10-CM | POA: Diagnosis not present

## 2018-06-08 NOTE — Progress Notes (Signed)
Electrophysiology Office Note Date: 06/09/2018  ID:  [name]., DOB 02-28-1961, MRN [id number]  PCP: Denita Lung, MD Electrophysiologist: Rayann Heman  CC: follow up on depressed EF  [name]. is a 57 y.o. male seen today for Dr Rayann Heman. During pre-op evaluation he was found to have decreased EF compared to baseline.  Stress testing demonstrated fixed defect.  He presents today to review results and evaluate for needed med changes. Since last being seen in our clinic, the patient reports doing relatively well.  He is recovering well from knee surgery and has the second knee planned to be done at the end of the month.  He denies chest pain, palpitations, dyspnea, PND, orthopnea, nausea, vomiting, dizziness, syncope, edema, weight gain, or early satiety.  Past Medical History:  Diagnosis Date  . Arthritis   . Diverticulosis   . Dyslipidemia   . GERD (gastroesophageal reflux disease)   . Herpes labialis   . Hypertension   . Longstanding persistent atrial fibrillation   . Metabolic syndrome   . Obesity   . Proteinuria   . Psoriasis   . Seborrheic dermatitis   . Sleep apnea    very compliant with CPAP  . TIA (transient ischemic attack) 2009   Past Surgical History:  Procedure Laterality Date  . CARDIOVERSION N/A 12/02/2016   Procedure: CARDIOVERSION;  Surgeon: Pixie Casino, MD;  Location: [facility] ENDOSCOPY;  Service: Cardiovascular;  Laterality: N/A;  . COLONOSCOPY    . JOINT REPLACEMENT  11/03/10   LT HIP  . PFO occluder cardiac valve  2006   Dr. Einar Gip, (hole in heart)  . TONSILLECTOMY AND ADENOIDECTOMY    . TOTAL HIP ARTHROPLASTY Left   . TOTAL KNEE ARTHROPLASTY Right 05/31/2018   Procedure: RIGHT TOTAL KNEE ARTHROPLASTY;  Surgeon: [name], MD;  Location: [facility] ORS;  Service: Orthopedics;  Laterality: Right;  70 mins  . WISDOM TOOTH EXTRACTION      Current Outpatient Medications  Medication Sig Dispense Refill  . ACCU-CHEK FASTCLIX LANCETS MISC CHECK  BLOOD SUGAR TWICE A DAY 102 each 4  . ACCU-CHEK GUIDE test strip TEST BLOOD SUGAR TWICE A DAY 200 each 12  . benazepril (LOTENSIN) 40 MG tablet Take 1 tablet (40 mg total) by mouth daily. 90 tablet 3  . clobetasol (TEMOVATE) 0.05 % external solution Apply 1 application topically 2 (two) times daily as needed (psoriasis).   0  . Dulaglutide (TRULICITY) 1.09 NA/3.5TD SOPN Inject 1 applicator into the skin once a week. 4 pen 5  . gabapentin (NEURONTIN) 300 MG capsule Take 1 capsule (300 mg total) by mouth 2 (two) times daily. 30 capsule 0  . HYDROcodone-acetaminophen (NORCO) 7.5-325 MG tablet Take 1-2 tablets by mouth every 4 (four) hours as needed for severe pain (pain score 7-10). 56 tablet 0  . Ixekizumab (TALTZ) 80 MG/ML SOAJ Inject 80 mg into the skin every 30 (thirty) days. 1 mL 5  . metFORMIN (GLUCOPHAGE-XR) 500 MG 24 hr tablet TAKE ONE TABLET BY MOUTH TWICE DAILY 180 tablet 1  . methocarbamol (ROBAXIN) 500 MG tablet Take 1 tablet (500 mg total) by mouth every 6 (six) hours as needed for muscle spasms. 40 tablet 0  . oxymetazoline (AFRIN) 0.05 % nasal spray Place 1 spray into both nostrils at bedtime.    . pantoprazole (PROTONIX) 40 MG tablet Take 1 tablet (40 mg total) by mouth every other day. 90 tablet 3  . rosuvastatin (CRESTOR) 10 MG tablet Take 1  tablet (10 mg total) by mouth daily. 90 tablet 3  . ULTICARE ALCOHOL SWABS 70 % PADS APPLY 2 TIMES DAILY. 120 each 3  . Vitamin D, Cholecalciferol, 1000 units CAPS Take 1,000 Units by mouth daily.     Alveda Reasons 20 MG TABS tablet TAKE 1 TABLET BY MOUTH DAILY WITH SUPPER. 90 tablet 1  . carvedilol (COREG) 12.5 MG tablet Take 0.5 tablets (6.25 mg total) by mouth 2 (two) times daily. 90 tablet 3   No current facility-administered medications for this visit.     Allergies:   Morphine and related   Social History: Social History   Socioeconomic History  . Marital status: Married    Spouse name: Not on file  . Number of children: 2  . Years  of education: Not on file  . Highest education level: Not on file  Occupational History  . Occupation: Theatre manager: [employer]  . Financial resource strain: Not on file  . Food insecurity:    Worry: Not on file    Inability: Not on file  . Transportation needs:    Medical: Not on file    Non-medical: Not on file  Tobacco Use  . Smoking status: Never Smoker  . Smokeless tobacco: Never Used  Substance and Sexual Activity  . Alcohol use: Yes    Comment: 2/month  . Drug use: No  . Sexual activity: Yes  Lifestyle  . Physical activity:    Days per week: Not on file    Minutes per session: Not on file  . Stress: Not on file  Relationships  . Social connections:    Talks on phone: Not on file    Gets together: Not on file    Attends religious service: Not on file    Active member of club or organization: Not on file    Attends meetings of clubs or organizations: Not on file    Relationship status: Not on file  . Intimate partner violence:    Fear of current or ex partner: Not on file    Emotionally abused: Not on file    Physically abused: Not on file    Forced sexual activity: Not on file  Other Topics Concern  . Not on file  Social History Narrative   Lives with spouse in [place]   Wife works for [employer] teaching program   Owns Advertising copywriter    Family History: Family History  Problem Relation Age of Onset  . Uterine cancer Mother 71       Cervical cath  . Crohn's disease Mother   . Esophageal cancer Father   . Cancer Other   . Obesity Other     Review of Systems: All other systems reviewed and are otherwise negative except as noted above.   Physical Exam: VS:  BP 130/86   Pulse 62   Ht 5' 9.75" (1.772 m)   Wt 273 lb 6.4 oz (124 kg)   SpO2 98%   BMI 39.51 kg/m  , BMI Body mass index is 39.51 kg/m. Wt Readings from Last 3 Encounters:  06/09/18 273 lb 6.4 oz (124 kg)  05/31/18 280 lb 11.2 oz (127.3 kg)    05/25/18 280 lb 11.2 oz (127.3 kg)    GEN- The patient is well appearing, alert and oriented x 3 today.   HEENT: normocephalic, atraumatic; sclera clear, conjunctiva pink; hearing intact; oropharynx clear; neck supple  Lungs- Clear to ausculation bilaterally,  normal work of breathing.  No wheezes, rales, rhonchi Heart- Irregular rate and rhythm  GI- soft, non-tender, non-distended, bowel sounds present  Extremities- no clubbing, cyanosis, or edema, +recent right knee surgery MS- no significant deformity or atrophy Skin- warm and dry, no rash or lesion  Psych- euthymic mood, full affect Neuro- strength and sensation are intact   EKG:  EKG is not ordered today.  Recent Labs: 10/18/2017: ALT 31 06/01/2018: BUN 20; Creatinine, Ser 1.20; Hemoglobin 15.2; Platelets 184; Potassium 4.6; Sodium 133    Other studies Reviewed: Additional studies/ records that were reviewed today include: Renee's office notes, echo, myoview   Assessment and Plan:  1.  Longstanding persistent atrial fibrillation Asymptomatic Continue Xarelto for CHADS2VASC of 5  2.  NICM Euvolemic on exam EF is depressed from previous Stop Diltiazem Start Coreg 12.5mg  bid  Continue ACE-I Consider transitioning to Entresto at follow up if BP ok He will need close follow up. He no longer follows with Dr Gwenlyn Found. His EF was 28% by myoview. Update echo after 3 months of optimal medical therapy.  He has a CDL  3.  HTN Stable No change required today  4.  OSA Compliant with CPAP  5.  Obesity Body mass index is 39.51 kg/m. Weight loss recommended    Current medicines are reviewed at length with the patient today.   The patient does not have concerns regarding his medicines.  The following changes were made today:  Stop Diltiazem, start Coreg 12.5mg  twice daily  Labs/ tests ordered today include: none No orders of the defined types were placed in this encounter.    Disposition:   Follow up with me or Dr Rayann Heman  in 4 weeks    Signed, Chanetta Marshall, NP 06/09/2018 10:15 AM   [address] [place] [address] [phone] (office) [phone] (fax)

## 2018-06-09 ENCOUNTER — Other Ambulatory Visit: Payer: Self-pay | Admitting: *Deleted

## 2018-06-09 ENCOUNTER — Ambulatory Visit: Payer: 59 | Admitting: Nurse Practitioner

## 2018-06-09 ENCOUNTER — Encounter: Payer: Self-pay | Admitting: Nurse Practitioner

## 2018-06-09 VITALS — BP 130/86 | HR 62 | Ht 69.75 in | Wt 273.4 lb

## 2018-06-09 DIAGNOSIS — I428 Other cardiomyopathies: Secondary | ICD-10-CM | POA: Diagnosis not present

## 2018-06-09 DIAGNOSIS — E1159 Type 2 diabetes mellitus with other circulatory complications: Secondary | ICD-10-CM | POA: Diagnosis not present

## 2018-06-09 DIAGNOSIS — I152 Hypertension secondary to endocrine disorders: Secondary | ICD-10-CM

## 2018-06-09 DIAGNOSIS — I10 Essential (primary) hypertension: Secondary | ICD-10-CM | POA: Diagnosis not present

## 2018-06-09 DIAGNOSIS — I4821 Permanent atrial fibrillation: Secondary | ICD-10-CM | POA: Diagnosis not present

## 2018-06-09 DIAGNOSIS — G4733 Obstructive sleep apnea (adult) (pediatric): Secondary | ICD-10-CM | POA: Diagnosis not present

## 2018-06-09 MED ORDER — CARVEDILOL 12.5 MG PO TABS: 6.2500 mg | ORAL_TABLET | Freq: Two times a day (BID) | ORAL | 3 refills | Status: DC

## 2018-06-09 MED FILL — CARVEDILOL 12.5 MG TABLET: 12.5 | 90 days supply | Qty: 90 | Fill #0

## 2018-06-09 MED FILL — TALTZ 80 MG/ML AUTOINJECTOR: 80 | 28 days supply | Qty: 1 | Fill #4

## 2018-06-09 NOTE — Patient Outreach (Signed)
[facility]) Care Management  06/09/2018  [name]. 07-26-1961 [id number]   Subjective: Telephone call to patient's home number, no answer, left HIPAA compliant voicemail message, and requested call back.    Objective:Per KPN (Knowledge Performance Now, point of care tool) and chart review,patient hospitalized  10/1/9 - 10/2/19for  Rightknee osteoarthritis, status post RIGHT T[facility]OPLAST[facility] on 05/31/18. Patient also has a history of Arthritis,Diverticulosis, diabetes, hypertension,Dyslipidemia,Longstanding persistent atrial fibrillation,Metabolic syndrome,Psoriasis,Seborrheic dermatitis,Sleep apnea(CPAP) , andTIA (transient ischemic attack). Patent transitioned from [place] to Wellness program for diabetes management to [facility] on 08/04/17.       Assessment: Received UMR Preoperative / Transition of care referral on 05/20/18. Preoperative call completed, and Transition of care follow up pending patient contact.      Plan:RNCM has sent  unsuccessful outreach  letter, [facility] pamphlet, will call patient for 3rd telephone outreach attempt, transition of care follow up, and proceed with case closure, within 10 business days if no return call.         Uyen Eichholz H. Annia Friendly, BSN, [place] Management [facility] Telephonic CM Phone: [phone] Fax: [phone]

## 2018-06-09 NOTE — Patient Instructions (Addendum)
Medication Instructions:  STOP Diltiazem  START Carvedilol (Coreg) 12.5 mg twice per day   -- If you need a refill on your cardiac medications before your next appointment, please call your pharmacy. --  Labwork: None ordered  Testing/Procedures: None ordered  Follow-Up: Your physician wants you to follow-up WEEK of November 11th with Amber or Dr Rayann Heman    Thank you for choosing [facility]!!    Any Other Special Instructions Will Be Listed Below (If Applicable).

## 2018-06-10 ENCOUNTER — Encounter: Payer: Self-pay | Admitting: *Deleted

## 2018-06-10 ENCOUNTER — Telehealth: Payer: Self-pay | Admitting: Internal Medicine

## 2018-06-10 ENCOUNTER — Ambulatory Visit: Payer: 59 | Admitting: Nurse Practitioner

## 2018-06-10 ENCOUNTER — Telehealth: Payer: Self-pay

## 2018-06-10 ENCOUNTER — Other Ambulatory Visit: Payer: Self-pay | Admitting: *Deleted

## 2018-06-10 DIAGNOSIS — M25661 Stiffness of right knee, not elsewhere classified: Secondary | ICD-10-CM | POA: Diagnosis not present

## 2018-06-10 MED ORDER — CARVEDILOL 12.5 MG PO TABS: 12.5000 mg | ORAL_TABLET | Freq: Two times a day (BID) | ORAL | 3 refills | Status: DC

## 2018-06-10 MED FILL — METHOCARBAMOL 500 MG TABS: 500 | 10 days supply | Qty: 40 | Fill #0

## 2018-06-10 NOTE — Patient Outreach (Addendum)
[facility]) Care Management  06/10/2018  [name]. Nov 15, 1960 [id number]   Subjective: Received voicemail message from [name] times 2, states he is returning call, and requested call back. Telephone call to patient's home number, no answer, left HIPAA compliant voicemail message, and requested call back. Telephone call to patient's home number, spoke with patient, and HIPAA verified.  Discussed [name] Care Management UMR Transition of care follow up, patient voiced understanding, and is in agreement to follow up.  Patient states he remember speaking with this RNCM in the past.  States his surgery recovery has been a challenge, following pain management regimen without difficulty, seeing daily range of motion and overall improvement.   States he is attending outpatient physical therapy at surgeon's office ([name]) and home exercise program in [place].  States hip surgery was easier, still planning to have 2nd knee surgery on 06/28/18, and has a follow up appointment with surgeon on 06/15/18.  States he saw his cardiologist on 06/09/18 and started new medication for permanent atrial fibrillation.  States he has been working from home and is planning to return to the office on 06/13/18.  States he has spoken with his primary MD several times this surgery and no follow up appointment needed at this time.  Patient states he is able to manage self care and has assistance as needed. Patient voices understanding of medical diagnosis, surgery, and treatment plan.  [facility] benefits discussed on 05/26/18 preoperative call and patient states no additional questions at this time.   Patient states he does not have any education material, transition of care, care coordination, transportation, community resource, or pharmacy needs at this time.  States he is very appreciative of the follow up and is in agreement to receive [name] Management  information post transition of care follow up for 2nd knee surgery.        Objective:Per KPN (Knowledge Performance Now, point of care tool) and chart review,patienthospitalized10/1/9 - 10/2/19forRightknee osteoarthritis, status postRIGHT TOTAL KNEE [place] 05/31/18. Patient also has a history of Arthritis,Diverticulosis, diabetes, hypertension,Dyslipidemia,Longstanding persistent atrial fibrillation,Metabolic syndrome,Psoriasis,Seborrheic dermatitis,Sleep apnea(CPAP) , andTIA (transient ischemic attack). Patent transitioned from [place] to Wellness program for diabetes management to [facility] on 08/04/17.       Assessment: Received UMR Preoperative / Transition of care referral on 05/20/18. Preoperative call completed, Transition of care follow up completed  from 1st knee  (right) surgery.  Transition of care follow up pending notification of patient discharge from 2nd knee (left) surgery.        Plan:RNCM will send successful outreach letter, [facility] pamphlet. RNCM will call patient for  telephone outreach attempt, transition of care follow up, within 3 business days of hospital discharge notification from 2nd knee (left) surgery.         Kasiya Burck H. Annia Friendly, BSN, [place] Management [facility] [place] Telephonic CM Phone: [phone] Fax: [phone]

## 2018-06-10 NOTE — Telephone Encounter (Signed)
New Message         Pt c/o medication issue:  1. Name of Medication: Carvedilol 12.5  2. How are you currently taking this medication (dosage and times per day)? 1/2 x 2 day  3. Are you having a reaction (difficulty breathing--STAT)? No   4. What is your medication issue? Patient needs clarification of medication.    Patient needs a call back ASAP.

## 2018-06-10 NOTE — Telephone Encounter (Signed)
Spoke to patient and clarified that he should be taking Carvedilol 12.5 mg twice per day.  He verbalized understanding and was thankful for the call.

## 2018-06-13 DIAGNOSIS — M25661 Stiffness of right knee, not elsewhere classified: Secondary | ICD-10-CM | POA: Diagnosis not present

## 2018-06-15 ENCOUNTER — Encounter: Payer: Self-pay | Admitting: Internal Medicine

## 2018-06-15 DIAGNOSIS — M25661 Stiffness of right knee, not elsewhere classified: Secondary | ICD-10-CM | POA: Diagnosis not present

## 2018-06-16 MED FILL — HYDROCODON-APAP 7.5-325: 7.5-325 | 7 days supply | Qty: 60 | Fill #0

## 2018-06-17 DIAGNOSIS — M25661 Stiffness of right knee, not elsewhere classified: Secondary | ICD-10-CM | POA: Diagnosis not present

## 2018-06-17 DIAGNOSIS — Z4733 Aftercare following explantation of knee joint prosthesis: Secondary | ICD-10-CM | POA: Diagnosis not present

## 2018-06-20 DIAGNOSIS — M25661 Stiffness of right knee, not elsewhere classified: Secondary | ICD-10-CM | POA: Diagnosis not present

## 2018-06-21 ENCOUNTER — Telehealth: Payer: Self-pay | Admitting: Internal Medicine

## 2018-06-21 MED ORDER — ENOXAPARIN SODIUM 120 MG/0.8ML ~~LOC~~ SOLN: 120.0000 mg | Freq: Two times a day (BID) | SUBCUTANEOUS | 0 refills | Status: DC

## 2018-06-21 MED FILL — ENOXAPARIN 120 MG/0.8 ML SY: 120 | 3 days supply | Qty: 4 | Fill #0

## 2018-06-21 NOTE — Telephone Encounter (Signed)
New Message    Patient is calling because he had a medical clearance for a right and left knee replacement. The issue was that he took all his lovonox for his first surgery and is now out for his next. He was not aware that he was to save 5. Please call to discuss

## 2018-06-21 NOTE — H&P (Signed)
TOTAL KNEE ADMISSION H&P  Patient is being admitted for left total knee arthroplasty.  Subjective:  Chief Complaint:    Left knee primary OA / pain  HPI: [name]., 57 y.o. male, has a history of pain and functional disability in the left knee due to arthritis and has failed non-surgical conservative treatments for greater than 12 weeks to include NSAID's and/or analgesics and activity modification.  Onset of symptoms was gradual, starting >10 years ago with gradually worsening course since that time. The patient noted prior procedures on the knee to include  arthroplasty on the right knee(s).  Patient currently rates pain in the left knee(s) at 7 out of 10 with activity. Patient has worsening of pain with activity and weight bearing, pain that interferes with activities of daily living, pain with passive range of motion, crepitus and joint swelling.  Patient has evidence of periarticular osteophytes and joint space narrowing by imaging studies.  There is no active infection.  Risks, benefits and expectations were discussed with the patient.  Risks including but not limited to the risk of anesthesia, blood clots, nerve damage, blood vessel damage, failure of the prosthesis, infection and up to and including death.  Patient understand the risks, benefits and expectations and wishes to proceed with surgery.   PCP: Denita Lung, MD  D/C Plans:       Home   Post-op Meds:       No Rx given  Tranexamic Acid:      To be given - IV   Decadron:      Is to be given  FYI:      Xarelto (on pre-op)  Oxycodone  CPAP  DME:    Pt already has equipment   PT:   OPPT     Patient Active Problem List   Diagnosis Date Noted  . S/P TKR (total knee replacement), right 05/31/2018  . Tubular adenoma of colon 10/18/2017  . 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 Diabetic nephropathy associated with type 2 diabetes mellitus ([place]) 10/13/2016  . Permanent atrial fibrillation 10/13/2016  .  Polycythemia vera ([place]) 10/09/2014  . Hyperlipidemia associated with type 2 diabetes mellitus ([place]) 09/17/2014  . Hypertension associated with diabetes ([place]) 09/17/2014  . Diabetes mellitus ([place]) 10/31/2013  . Hx-TIA (transient ischemic attack) 09/05/2012  . GERD (gastroesophageal reflux disease) 08/17/2011  . Obesity (BMI 30-39.9) 08/17/2011  . Psoriatic arthritis ([place]) 08/17/2011  . Obstructive sleep apnea 08/05/2009   Past Medical History:  Diagnosis Date  . Arthritis   . Diverticulosis   . Dyslipidemia   . GERD (gastroesophageal reflux disease)   . Herpes labialis   . Hypertension   . Longstanding persistent atrial fibrillation   . Metabolic syndrome   . Obesity   . Proteinuria   . Psoriasis   . Seborrheic dermatitis   . Sleep apnea    very compliant with CPAP  . TIA (transient ischemic attack) 2009    Past Surgical History:  Procedure Laterality Date  . CARDIOVERSION N/A 12/02/2016   Procedure: CARDIOVERSION;  Surgeon: Pixie Casino, MD;  Location: [facility] ENDOSCOPY;  Service: Cardiovascular;  Laterality: N/A;  . COLONOSCOPY    . JOINT REPLACEMENT  11/03/10   LT HIP  . PFO occluder cardiac valve  2006   Dr. Einar Gip, (hole in heart)  . TONSILLECTOMY AND ADENOIDECTOMY    . TOTAL HIP ARTHROPLASTY Left   . TOTAL KNEE ARTHROPLASTY Right 05/31/2018   Procedure: RIGHT TOTAL KNEE ARTHROPLASTY;  Surgeon: [name], MD;  Location: [facility] ORS;  Service: Orthopedics;  Laterality: Right;  70 mins  . WISDOM TOOTH EXTRACTION      No current facility-administered medications for this encounter.    Current Outpatient Medications  Medication Sig Dispense Refill Last Dose  . ACCU-CHEK FASTCLIX LANCETS MISC CHECK BLOOD SUGAR TWICE A DAY 102 each 4 Taking  . ACCU-CHEK GUIDE test strip TEST BLOOD SUGAR TWICE A DAY 200 each 12 Taking  . benazepril (LOTENSIN) 40 MG tablet Take 1 tablet (40 mg total) by mouth daily. 90 tablet 3 Taking  . carvedilol (COREG) 12.5 MG tablet Take 1 tablet (12.5 mg  total) by mouth 2 (two) times daily. 90 tablet 3   . clobetasol (TEMOVATE) 0.05 % external solution Apply 1 application topically 2 (two) times daily as needed (psoriasis).   0 Taking  . Dulaglutide (TRULICITY) 2.83 TD/1.7OH SOPN Inject 1 applicator into the skin once a week. 4 pen 5 Taking  . gabapentin (NEURONTIN) 300 MG capsule Take 1 capsule (300 mg total) by mouth 2 (two) times daily. 30 capsule 0 Taking  . HYDROcodone-acetaminophen (NORCO) 7.5-325 MG tablet Take 1-2 tablets by mouth every 4 (four) hours as needed for severe pain (pain score 7-10). 56 tablet 0 Taking  . Ixekizumab (TALTZ) 80 MG/ML SOAJ Inject 80 mg into the skin every 30 (thirty) days. 1 mL 5 Taking  . metFORMIN (GLUCOPHAGE-XR) 500 MG 24 hr tablet TAKE ONE TABLET BY MOUTH TWICE DAILY 180 tablet 1 Taking  . methocarbamol (ROBAXIN) 500 MG tablet Take 1 tablet (500 mg total) by mouth every 6 (six) hours as needed for muscle spasms. 40 tablet 0 Taking  . oxymetazoline (AFRIN) 0.05 % nasal spray Place 1 spray into both nostrils at bedtime.   Taking  . pantoprazole (PROTONIX) 40 MG tablet Take 1 tablet (40 mg total) by mouth every other day. 90 tablet 3 Taking  . rosuvastatin (CRESTOR) 10 MG tablet Take 1 tablet (10 mg total) by mouth daily. 90 tablet 3 Taking  . ULTICARE ALCOHOL SWABS 70 % PADS APPLY 2 TIMES DAILY. 120 each 3 Taking  . Vitamin D, Cholecalciferol, 1000 units CAPS Take 1,000 Units by mouth daily.    Taking  . XARELTO 20 MG TABS tablet TAKE 1 TABLET BY MOUTH DAILY WITH SUPPER. 90 tablet 1 Taking   Allergies  Allergen Reactions  . Morphine And Related     Hallucinations    Social History   Tobacco Use  . Smoking status: Never Smoker  . Smokeless tobacco: Never Used  Substance Use Topics  . Alcohol use: Yes    Comment: 2/month    Family History  Problem Relation Age of Onset  . Uterine cancer Mother 3       Cervical cath  . Crohn's disease Mother   . Esophageal cancer Father   . Cancer Other   .  Obesity Other      Review of Systems  Constitutional: Negative.   HENT: Negative.   Eyes: Negative.   Respiratory: Negative.   Cardiovascular: Negative.   Gastrointestinal: Positive for diarrhea and heartburn.  Genitourinary: Negative.   Musculoskeletal: Positive for back pain and joint pain.  Skin: Negative.   Neurological: Negative.   Endo/Heme/Allergies: Negative.   Psychiatric/Behavioral: Negative.     Objective:  Physical Exam  Constitutional: He is oriented to person, place, and time. He appears well-developed.  HENT:  Head: Normocephalic.  Eyes: Pupils are equal, round, and reactive to light.  Neck: Neck  supple. No JVD present. No tracheal deviation present. No thyromegaly present.  Cardiovascular: Normal rate, regular rhythm and intact distal pulses.  A-fib  Respiratory: Effort normal and breath sounds normal. No respiratory distress. He has no wheezes.  GI: Soft. There is no tenderness. There is no guarding.  Musculoskeletal:       Left knee: He exhibits decreased range of motion, swelling and bony tenderness. He exhibits no ecchymosis, no deformity, no laceration and no erythema. Tenderness found.  Lymphadenopathy:    He has no cervical adenopathy.  Neurological: He is alert and oriented to person, place, and time.  Skin: Skin is warm and dry.  Psychiatric: He has a normal mood and affect.      Labs:  Estimated body mass index is 39.51 kg/m as calculated from the following:   Height as of 06/09/18: 5' 9.75" (1.772 m).   Weight as of 06/09/18: 124 kg.   Imaging Review Plain radiographs demonstrate severe degenerative joint disease of the left knee(s).  The bone quality appears to be good for age and reported activity level.   Preoperative templating of the joint replacement has been completed, documented, and submitted to the Operating Room personnel in order to optimize intra-operative equipment management.    Patient's anticipated LOS is less than 2  midnights, meeting these requirements: - Younger than 21 - Lives within 1 hour of care - Has a competent adult at home to recover with post-op recover - NO history of  - Chronic pain requiring opiods  - Diabetes  - Coronary Artery Disease  - Heart failure  - Heart attack  - Stroke  - DVT/VTE  - Cardiac arrhythmia  - Respiratory Failure/COPD  - Renal failure  - Anemia  - Advanced Liver disease    Assessment/Plan:  End stage arthritis, left knee   The patient history, physical examination, clinical judgment of the provider and imaging studies are consistent with end stage degenerative joint disease of the left knee(s) and total knee arthroplasty is deemed medically necessary. The treatment options including medical management, injection therapy arthroscopy and arthroplasty were discussed at length. The risks and benefits of total knee arthroplasty were presented and reviewed. The risks due to aseptic loosening, infection, stiffness, patella tracking problems, thromboembolic complications and other imponderables were discussed. The patient acknowledged the explanation, agreed to proceed with the plan and consent was signed. Patient is being admitted for inpatient treatment for surgery, pain control, PT, OT, prophylactic antibiotics, VTE prophylaxis, progressive ambulation and ADL's and discharge planning. The patient is planning to be discharged home.     West Pugh Jermayne Sweeney   PA-C  06/21/2018, 12:01 PM

## 2018-06-21 NOTE — Telephone Encounter (Signed)
Pt was prescribed 10 Lovenox syringes. He only needed 5 Lovenox syringes for each procedure (see 05/09/18 office visit for details). Not sure how patient would have managed to use 10 syringes for his first knee replacement.  Contacted pt who stated he thought he was supposed to use 10 syringes for the first surgery since the prescription was sent in for 10 syringes. Asked him when he took the extra 5 Lovenox shots. He states he took an evening injection on 9/30 which he was not instructed to do. He also started his Lovenox injections earlier than he was supposed to do per the instructions that he received. He states he did not read the instructions clearly and that it was unclear to him since Lovenox was sent in for both surgeries instead of 1, despite clear instructions for timing of each Lovenox injection. Reviewed instructions with patient again for upcoming knee surgery and sent in 5 more Lovenox syringes.

## 2018-06-22 MED FILL — METHOCARBAMOL 500 MG TABS: 500 | 10 days supply | Qty: 40 | Fill #0

## 2018-06-22 MED FILL — HYDROCODON-APAP 7.5-325: 7.5-325 | 8 days supply | Qty: 60 | Fill #0

## 2018-06-23 DIAGNOSIS — M25661 Stiffness of right knee, not elsewhere classified: Secondary | ICD-10-CM | POA: Diagnosis not present

## 2018-06-23 NOTE — Patient Instructions (Addendum)
William Penner Jr.  06/23/2018   Your procedure is scheduled on: Tuesday 06/28/2018  Report to [facility]  Entrance              Report to admitting at   0530 AM              Bring CPAP mask and tubing with you the morning of surgery!   Call this number if you have problems the morning of surgery [phone]   How to Manage Your Diabetes Before and After Surgery  Why is it important to control my blood sugar before and after surgery? . Improving blood sugar levels before and after surgery helps healing and can limit problems. . A way of improving blood sugar control is eating a healthy diet by: o  Eating less sugar and carbohydrates o  Increasing activity/exercise o  Talking with your doctor about reaching your blood sugar goals . High blood sugars (greater than 180 mg/dL) can raise your risk of infections and slow your recovery, so you will need to focus on controlling your diabetes during the weeks before surgery. . Make sure that the doctor who takes care of your diabetes knows about your planned surgery including the date and location.  How do I manage my blood sugar before surgery? . Check your blood sugar at least 4 times a day, starting 2 days before surgery, to make sure that the level is not too high or low. o Check your blood sugar the morning of your surgery when you wake up and every 2 hours until you get to the Short Stay unit. . If your blood sugar is less than 70 mg/dL, you will need to treat for low blood sugar: o Do not take insulin. o Treat a low blood sugar (less than 70 mg/dL) with  cup of clear juice (cranberry or apple), 4 glucose tablets, OR glucose gel. o Recheck blood sugar in 15 minutes after treatment (to make sure it is greater than 70 mg/dL). If your blood sugar is not greater than 70 mg/dL on recheck, call [phone] for further instructions. . Report your blood sugar to the short stay nurse when you get to Short  Stay.  . If you are admitted to the hospital after surgery: o Your blood sugar will be checked by the staff and you will probably be given insulin after surgery (instead of oral diabetes medicines) to make sure you have good blood sugar levels. o The goal for blood sugar control after surgery is 80-180 mg/dL.   WHAT DO I DO ABOUT MY DIABETES MEDICATION?       The day before surgery, Take Metformin as usual.   . Do not take oral diabetes medicines (pills) the morning of surgery.   . The day of surgery, do not take other diabetes injectables, including Byetta (exenatide), Bydureon (exenatide ER), Victoza (liraglutide), or Trulicity (dulaglutide).      Remember: Do not eat food or drink liquids :After Midnight.              BRUSH YOUR TEETH MORNING OF SURGERY AND RINSE YOUR MOUTH OUT, NO CHEWING GUM CANDY OR MINTS.     Take these medicines the morning of surgery with A SIP OF WATER: Carvedilol (Coreg), Rosuvastatin (Crestor)   DO NOT TAKE ANY DIABETIC MEDICATIONS DAY OF YOUR SURGERY  You may not have any metal on your body including hair pins and              piercings  Do not wear jewelry, make-up, lotions, powders or perfumes, deodorant              Men may shave face and neck.   Do not bring valuables to the hospital. [name]  Contacts, dentures or bridgework may not be worn into surgery.  Leave suitcase in the car. After surgery it may be brought to your room.                  Please read over the following fact sheets you were given: _____________________________________________________________________             [facility] - Preparing for Surgery Before surgery, you can play an important role.  Because skin is not sterile, your skin needs to be as free of germs as possible.  You can reduce the number of germs on your skin by washing with CHG (chlorahexidine gluconate) soap before  surgery.  CHG is an antiseptic cleaner which kills germs and bonds with the skin to continue killing germs even after washing. Please DO NOT use if you have an allergy to CHG or antibacterial soaps.  If your skin becomes reddened/irritated stop using the CHG and inform your nurse when you arrive at Short Stay. Do not shave (including legs and underarms) for at least 48 hours prior to the first CHG shower.  You may shave your face/neck. Please follow these instructions carefully:  1.  Shower with CHG Soap the night before surgery and the  morning of Surgery.  2.  If you choose to wash your hair, wash your hair first as usual with your  normal  shampoo.  3.  After you shampoo, rinse your hair and body thoroughly to remove the  shampoo.                           4.  Use CHG as you would any other liquid soap.  You can apply chg directly  to the skin and wash                       Gently with a scrungie or clean washcloth.  5.  Apply the CHG Soap to your body ONLY FROM THE NECK DOWN.   Do not use on face/ open                           Wound or open sores. Avoid contact with eyes, ears mouth and genitals (private parts).                       Wash face,  Genitals (private parts) with your normal soap.             6.  Wash thoroughly, paying special attention to the area where your surgery  will be performed.  7.  Thoroughly rinse your body with warm water from the neck down.  8.  DO NOT shower/wash with your normal soap after using and rinsing off  the CHG Soap.                9.  Pat yourself dry  with a clean towel.            10.  Wear clean pajamas.            11.  Place clean sheets on your bed the night of your first shower and do not  sleep with pets. Day of Surgery : Do not apply any lotions/deodorants the morning of surgery.  Please wear clean clothes to the hospital/surgery center.  FAILURE TO FOLLOW THESE INSTRUCTIONS MAY RESULT IN THE CANCELLATION OF YOUR SURGERY PATIENT  SIGNATURE_________________________________  NURSE SIGNATURE__________________________________  ________________________________________________________________________   [name]  An incentive spirometer is a tool that can help keep your lungs clear and active. This tool measures how well you are filling your lungs with each breath. Taking long deep breaths may help reverse or decrease the chance of developing breathing (pulmonary) problems (especially infection) following:  A long period of time when you are unable to move or be active. BEFORE THE PROCEDURE   If the spirometer includes an indicator to show your best effort, your nurse or respiratory therapist will set it to a desired goal.  If possible, sit up straight or lean slightly forward. Try not to slouch.  Hold the incentive spirometer in an upright position. INSTRUCTIONS FOR USE  1. Sit on the edge of your bed if possible, or sit up as far as you can in bed or on a chair. 2. Hold the incentive spirometer in an upright position. 3. Breathe out normally. 4. Place the mouthpiece in your mouth and seal your lips tightly around it. 5. Breathe in slowly and as deeply as possible, raising the piston or the ball toward the top of the column. 6. Hold your breath for 3-5 seconds or for as long as possible. Allow the piston or ball to fall to the bottom of the column. 7. Remove the mouthpiece from your mouth and breathe out normally. 8. Rest for a few seconds and repeat Steps 1 through 7 at least 10 times every 1-2 hours when you are awake. Take your time and take a few normal breaths between deep breaths. 9. The spirometer may include an indicator to show your best effort. Use the indicator as a goal to work toward during each repetition. 10. After each set of 10 deep breaths, practice coughing to be sure your lungs are clear. If you have an incision (the cut made at the time of surgery), support your incision when coughing  by placing a pillow or rolled up towels firmly against it. Once you are able to get out of bed, walk around indoors and cough well. You may stop using the incentive spirometer when instructed by your caregiver.  RISKS AND COMPLICATIONS  Take your time so you do not get dizzy or light-headed.  If you are in pain, you may need to take or ask for pain medication before doing incentive spirometry. It is harder to take a deep breath if you are having pain. AFTER USE  Rest and breathe slowly and easily.  It can be helpful to keep track of a log of your progress. Your caregiver can provide you with a simple table to help with this. If you are using the spirometer at home, follow these instructions: [place] IF:   You are having difficultly using the spirometer.  You have trouble using the spirometer as often as instructed.  Your pain medication is not giving enough relief while using the spirometer.  You develop fever of 100.5 F (  38.1 C) or higher. SEEK IMMEDIATE MEDICAL CARE IF:   You cough up bloody sputum that had not been present before.  You develop fever of 102 F (38.9 C) or greater.  You develop worsening pain at or near the incision site. MAKE SURE YOU:   Understand these instructions.  Will watch your condition.  Will get help right away if you are not doing well or get worse. Document Released: 12/28/2006 Document Revised: 11/09/2011 Document Reviewed: 02/28/2007 ExitCare Patient Information 2014 [employer], [place].   ________________________________________________________________________  WHAT IS A BLOOD TRANSFUSION? Blood Transfusion Information  A transfusion is the replacement of blood or some of its parts. Blood is made up of multiple cells which provide different functions.  Red blood cells carry oxygen and are used for blood loss replacement.  White blood cells fight against infection.  Platelets control bleeding.  Plasma helps clot  blood.  Other blood products are available for specialized needs, such as hemophilia or other clotting disorders. BEFORE THE TRANSFUSION  Who gives blood for transfusions?   Healthy volunteers who are fully evaluated to make sure their blood is safe. This is blood bank blood. Transfusion therapy is the safest it has ever been in the practice of medicine. Before blood is taken from a donor, a complete history is taken to make sure that person has no history of diseases nor engages in risky social behavior (examples are intravenous drug use or sexual activity with multiple partners). The donor's travel history is screened to minimize risk of transmitting infections, such as malaria. The donated blood is tested for signs of infectious diseases, such as HIV and hepatitis. The blood is then tested to be sure it is compatible with you in order to minimize the chance of a transfusion reaction. If you or a relative donates blood, this is often done in anticipation of surgery and is not appropriate for emergency situations. It takes many days to process the donated blood. RISKS AND COMPLICATIONS Although transfusion therapy is very safe and saves many lives, the main dangers of transfusion include:   Getting an infectious disease.  Developing a transfusion reaction. This is an allergic reaction to something in the blood you were given. Every precaution is taken to prevent this. The decision to have a blood transfusion has been considered carefully by your caregiver before blood is given. Blood is not given unless the benefits outweigh the risks. AFTER THE TRANSFUSION  Right after receiving a blood transfusion, you will usually feel much better and more energetic. This is especially true if your red blood cells have gotten low (anemic). The transfusion raises the level of the red blood cells which carry oxygen, and this usually causes an energy increase.  The nurse administering the transfusion will monitor  you carefully for complications. HOME CARE INSTRUCTIONS  No special instructions are needed after a transfusion. You may find your energy is better. Speak with your caregiver about any limitations on activity for underlying diseases you may have. SEEK MEDICAL CARE IF:   Your condition is not improving after your transfusion.  You develop redness or irritation at the intravenous (IV) site. SEEK IMMEDIATE MEDICAL CARE IF:  Any of the following symptoms occur over the next 12 hours:  Shaking chills.  You have a temperature by mouth above 102 F (38.9 C), not controlled by medicine.  Chest, back, or muscle pain.  People around you feel you are not acting correctly or are confused.  Shortness of breath or difficulty breathing.  Dizziness and fainting.  You get a rash or develop hives.  You have a decrease in urine output.  Your urine turns a dark color or changes to pink, red, or brown. Any of the following symptoms occur over the next 10 days:  You have a temperature by mouth above 102 F (38.9 C), not controlled by medicine.  Shortness of breath.  Weakness after normal activity.  The white part of the eye turns yellow (jaundice).  You have a decrease in the amount of urine or are urinating less often.  Your urine turns a dark color or changes to pink, red, or brown. Document Released: 08/14/2000 Document Revised: 11/09/2011 Document Reviewed: 04/02/2008 [facility] Patient Information 2014 [place], [place].  _______________________________________________________________________

## 2018-06-23 NOTE — Progress Notes (Signed)
06/21/2018- noted in Epic- Lovenox schedule dosing from [facility].  06/09/2018- noted in [place] office note from Chanetta Marshall, NP  05/23/2018- noted in [place] test  05/09/2018- noted in Epic- ECHO  05/06/2018- noted in Epic-EKG

## 2018-06-24 ENCOUNTER — Encounter (HOSPITAL_COMMUNITY)
Admission: RE | Admit: 2018-06-24 | Discharge: 2018-06-24 | Disposition: A | Discharge status: Home / Self Care | Payer: 59 | Source: Ambulatory Visit | Attending: Orthopedic Surgery | Admitting: Orthopedic Surgery

## 2018-06-24 ENCOUNTER — Other Ambulatory Visit: Payer: Self-pay

## 2018-06-24 ENCOUNTER — Encounter (HOSPITAL_COMMUNITY): Payer: Self-pay

## 2018-06-24 DIAGNOSIS — M1712 Unilateral primary osteoarthritis, left knee: Secondary | ICD-10-CM | POA: Insufficient documentation

## 2018-06-24 DIAGNOSIS — Z01812 Encounter for preprocedural laboratory examination: Secondary | ICD-10-CM | POA: Insufficient documentation

## 2018-06-24 DIAGNOSIS — H5203 Hypermetropia, bilateral: Secondary | ICD-10-CM | POA: Diagnosis not present

## 2018-06-24 LAB — HEMOGLOBIN A1C
Hgb A1c MFr Bld: 4.8 % (ref 4.8–5.6)
Mean Plasma Glucose: 91.06 mg/dL

## 2018-06-24 LAB — CBC
HEMATOCRIT: 44 % (ref 39.0–52.0)
Hemoglobin: 15 g/dL (ref 13.0–17.0)
MCH: 30.7 pg (ref 26.0–34.0)
MCHC: 34.1 g/dL (ref 30.0–36.0)
MCV: 90.2 fL (ref 80.0–100.0)
Platelets: 240 10*3/uL (ref 150–400)
RBC: 4.88 MIL/uL (ref 4.22–5.81)
RDW: 12.8 % (ref 11.5–15.5)
WBC: 7.6 10*3/uL (ref 4.0–10.5)
nRBC: 0 % (ref 0.0–0.2)

## 2018-06-24 LAB — BASIC METABOLIC PANEL
Anion gap: 6 (ref 5–15)
BUN: 22 mg/dL — AB (ref 6–20)
CHLORIDE: 103 mmol/L (ref 98–111)
CO2: 26 mmol/L (ref 22–32)
Calcium: 9.6 mg/dL (ref 8.9–10.3)
Creatinine, Ser: 1.17 mg/dL (ref 0.61–1.24)
GFR calc Af Amer: >60 mL/min (ref >60)
GLUCOSE: 117 mg/dL — AB (ref 70–99)
POTASSIUM: 4.5 mmol/L (ref 3.5–5.1)
Sodium: 135 mmol/L (ref 135–145)

## 2018-06-24 LAB — GLUCOSE, CAPILLARY: Glucose-Capillary: 113 mg/dL — ABNORMAL HIGH (ref 70–99)

## 2018-06-24 LAB — SURGICAL PCR SCREEN
MRSA, PCR: NEGATIVE
STAPHYLOCOCCUS AUREUS: NEGATIVE

## 2018-06-24 LAB — HM DIABETES EYE EXAM

## 2018-06-24 NOTE — Progress Notes (Signed)
Spoke to Dr. Hulan Fray, MDA face to face about patient's office visit on 05/06/2018 with Tommye Standard, PA for Cardiology, Echocardiogram done on 05/09/2018, Nuclear Stress test done on 05/23/2018 and last Cardiology office visit with Chanetta Marshall, NP for Cardiology. Reviewed these results with Dr. Lanetta Inch and inquiring if patient needed a Cardiac Clearance before his surgery on 06/28/2018. Per Dr. Lanetta Inch, after looking at all the notes and tests , patient is OK for surgery and does not need a Cardiac Clearance.

## 2018-06-27 DIAGNOSIS — M25661 Stiffness of right knee, not elsewhere classified: Secondary | ICD-10-CM | POA: Diagnosis not present

## 2018-06-27 MED ORDER — DEXTROSE 5 % IV SOLN
3.0000 g | INTRAVENOUS | Status: AC
  Administered 2018-06-28: 3 g via INTRAVENOUS
  Filled 2018-06-27: qty 3

## 2018-06-28 ENCOUNTER — Other Ambulatory Visit: Payer: Self-pay

## 2018-06-28 ENCOUNTER — Inpatient Hospital Stay (HOSPITAL_COMMUNITY): Payer: 59 | Admitting: Anesthesiology

## 2018-06-28 ENCOUNTER — Encounter (HOSPITAL_COMMUNITY): Payer: Self-pay | Admitting: *Deleted

## 2018-06-28 ENCOUNTER — Inpatient Hospital Stay (HOSPITAL_COMMUNITY)
Admission: RE | Admit: 2018-06-28 | Discharge: 2018-06-29 | DRG: 470 | Disposition: A | Discharge status: Home / Self Care | Payer: 59 | Attending: Orthopedic Surgery | Admitting: Orthopedic Surgery

## 2018-06-28 ENCOUNTER — Encounter (HOSPITAL_COMMUNITY)
Admission: RE | Disposition: A | Discharge status: Home / Self Care | Payer: Self-pay | Source: Home / Self Care | Attending: Orthopedic Surgery

## 2018-06-28 DIAGNOSIS — Z96642 Presence of left artificial hip joint: Secondary | ICD-10-CM | POA: Diagnosis present

## 2018-06-28 DIAGNOSIS — Z800 Family history of malignant neoplasm of digestive organs: Secondary | ICD-10-CM | POA: Diagnosis not present

## 2018-06-28 DIAGNOSIS — M65862 Other synovitis and tenosynovitis, left lower leg: Secondary | ICD-10-CM | POA: Diagnosis present

## 2018-06-28 DIAGNOSIS — M1712 Unilateral primary osteoarthritis, left knee: Principal | ICD-10-CM | POA: Diagnosis present

## 2018-06-28 DIAGNOSIS — D45 Polycythemia vera: Secondary | ICD-10-CM | POA: Diagnosis present

## 2018-06-28 DIAGNOSIS — I152 Hypertension secondary to endocrine disorders: Secondary | ICD-10-CM | POA: Diagnosis present

## 2018-06-28 DIAGNOSIS — Z85828 Personal history of other malignant neoplasm of skin: Secondary | ICD-10-CM

## 2018-06-28 DIAGNOSIS — E1169 Type 2 diabetes mellitus with other specified complication: Secondary | ICD-10-CM | POA: Diagnosis present

## 2018-06-28 DIAGNOSIS — E1121 Type 2 diabetes mellitus with diabetic nephropathy: Secondary | ICD-10-CM | POA: Diagnosis present

## 2018-06-28 DIAGNOSIS — R197 Diarrhea, unspecified: Secondary | ICD-10-CM | POA: Diagnosis present

## 2018-06-28 DIAGNOSIS — K219 Gastro-esophageal reflux disease without esophagitis: Secondary | ICD-10-CM | POA: Diagnosis present

## 2018-06-28 DIAGNOSIS — E785 Hyperlipidemia, unspecified: Secondary | ICD-10-CM | POA: Diagnosis present

## 2018-06-28 DIAGNOSIS — M25762 Osteophyte, left knee: Secondary | ICD-10-CM | POA: Diagnosis present

## 2018-06-28 DIAGNOSIS — Z79891 Long term (current) use of opiate analgesic: Secondary | ICD-10-CM

## 2018-06-28 DIAGNOSIS — G8918 Other acute postprocedural pain: Secondary | ICD-10-CM | POA: Diagnosis not present

## 2018-06-28 DIAGNOSIS — Z96651 Presence of right artificial knee joint: Secondary | ICD-10-CM | POA: Diagnosis present

## 2018-06-28 DIAGNOSIS — Z885 Allergy status to narcotic agent status: Secondary | ICD-10-CM | POA: Diagnosis not present

## 2018-06-28 DIAGNOSIS — I4821 Permanent atrial fibrillation: Secondary | ICD-10-CM | POA: Diagnosis present

## 2018-06-28 DIAGNOSIS — E669 Obesity, unspecified: Secondary | ICD-10-CM | POA: Diagnosis present

## 2018-06-28 DIAGNOSIS — L409 Psoriasis, unspecified: Secondary | ICD-10-CM | POA: Diagnosis present

## 2018-06-28 DIAGNOSIS — Z96652 Presence of left artificial knee joint: Secondary | ICD-10-CM

## 2018-06-28 DIAGNOSIS — Z7984 Long term (current) use of oral hypoglycemic drugs: Secondary | ICD-10-CM | POA: Diagnosis not present

## 2018-06-28 DIAGNOSIS — L4050 Arthropathic psoriasis, unspecified: Secondary | ICD-10-CM | POA: Diagnosis present

## 2018-06-28 DIAGNOSIS — Z8673 Personal history of transient ischemic attack (TIA), and cerebral infarction without residual deficits: Secondary | ICD-10-CM

## 2018-06-28 DIAGNOSIS — G4733 Obstructive sleep apnea (adult) (pediatric): Secondary | ICD-10-CM | POA: Diagnosis present

## 2018-06-28 DIAGNOSIS — Z7901 Long term (current) use of anticoagulants: Secondary | ICD-10-CM

## 2018-06-28 DIAGNOSIS — Z79899 Other long term (current) drug therapy: Secondary | ICD-10-CM

## 2018-06-28 DIAGNOSIS — I10 Essential (primary) hypertension: Secondary | ICD-10-CM | POA: Diagnosis not present

## 2018-06-28 DIAGNOSIS — M549 Dorsalgia, unspecified: Secondary | ICD-10-CM | POA: Diagnosis present

## 2018-06-28 DIAGNOSIS — Z8049 Family history of malignant neoplasm of other genital organs: Secondary | ICD-10-CM | POA: Diagnosis not present

## 2018-06-28 DIAGNOSIS — R12 Heartburn: Secondary | ICD-10-CM | POA: Diagnosis present

## 2018-06-28 DIAGNOSIS — E119 Type 2 diabetes mellitus without complications: Secondary | ICD-10-CM | POA: Diagnosis not present

## 2018-06-28 DIAGNOSIS — Z6839 Body mass index (BMI) 39.0-39.9, adult: Secondary | ICD-10-CM

## 2018-06-28 DIAGNOSIS — Z96659 Presence of unspecified artificial knee joint: Secondary | ICD-10-CM

## 2018-06-28 HISTORY — PX: TOTAL KNEE ARTHROPLASTY: SHX125

## 2018-06-28 HISTORY — DX: Presence of left artificial knee joint: Z96.652

## 2018-06-28 LAB — GLUCOSE, CAPILLARY
GLUCOSE-CAPILLARY: 173 mg/dL — AB (ref 70–99)
Glucose-Capillary: 124 mg/dL — ABNORMAL HIGH (ref 70–99)
Glucose-Capillary: 123 mg/dL — ABNORMAL HIGH (ref 70–99)
Glucose-Capillary: 142 mg/dL — ABNORMAL HIGH (ref 70–99)

## 2018-06-28 LAB — TYPE AND SCREEN
ABO/RH(D): O POS
ANTIBODY SCREEN: NEGATIVE

## 2018-06-28 SURGERY — ARTHROPLASTY, KNEE, TOTAL
Anesthesia: Spinal | Site: Knee | Laterality: Left

## 2018-06-28 MED ORDER — DOCUSATE SODIUM 100 MG PO CAPS
100.0000 mg | ORAL_CAPSULE | Freq: Two times a day (BID) | ORAL | Status: DC
  Administered 2018-06-28 – 2018-06-29 (×2): 100 mg via ORAL
  Filled 2018-06-28 (×2): qty 1

## 2018-06-28 MED ORDER — INSULIN ASPART 100 UNIT/ML ~~LOC~~ SOLN
0.0000 [IU] | Freq: Three times a day (TID) | SUBCUTANEOUS | Status: DC
  Administered 2018-06-28: 3 [IU] via SUBCUTANEOUS
  Administered 2018-06-28 – 2018-06-29 (×2): 2 [IU] via SUBCUTANEOUS

## 2018-06-28 MED ORDER — OXYCODONE HCL 5 MG PO TABS: 5.0000 mg | ORAL_TABLET | ORAL | 0 refills | Status: DC | PRN

## 2018-06-28 MED ORDER — PROPOFOL 500 MG/50ML IV EMUL
INTRAVENOUS | Status: DC | PRN
  Administered 2018-06-28: 100 ug/kg/min via INTRAVENOUS

## 2018-06-28 MED ORDER — HYDROMORPHONE HCL 1 MG/ML IJ SOLN: 0.2500 mg | INTRAMUSCULAR | Status: DC | PRN

## 2018-06-28 MED ORDER — METHOCARBAMOL 500 MG IVPB - SIMPLE MED
500.0000 mg | Freq: Four times a day (QID) | INTRAVENOUS | Status: DC | PRN
  Filled 2018-06-28: qty 50

## 2018-06-28 MED ORDER — MIDAZOLAM HCL 2 MG/2ML IJ SOLN
INTRAMUSCULAR | Status: AC
  Filled 2018-06-28: qty 2

## 2018-06-28 MED ORDER — ALUM & MAG HYDROXIDE-SIMETH 200-200-20 MG/5ML PO SUSP: 15.0000 mL | ORAL | Status: DC | PRN

## 2018-06-28 MED ORDER — CHLORHEXIDINE GLUCONATE 4 % EX LIQD: 60.0000 mL | Freq: Once | CUTANEOUS | Status: DC

## 2018-06-28 MED ORDER — FENTANYL CITRATE (PF) 100 MCG/2ML IJ SOLN
INTRAMUSCULAR | Status: AC
  Filled 2018-06-28: qty 2

## 2018-06-28 MED ORDER — FENTANYL CITRATE (PF) 100 MCG/2ML IJ SOLN
25.0000 ug | INTRAMUSCULAR | Status: DC | PRN
  Administered 2018-06-28: 50 ug via INTRAVENOUS
  Filled 2018-06-28: qty 2

## 2018-06-28 MED ORDER — CLOBETASOL PROPIONATE 0.05 % EX SOLN: 1.0000 "application " | Freq: Two times a day (BID) | CUTANEOUS | Status: DC | PRN

## 2018-06-28 MED ORDER — DEXAMETHASONE SODIUM PHOSPHATE 10 MG/ML IJ SOLN
10.0000 mg | Freq: Once | INTRAMUSCULAR | Status: AC
  Administered 2018-06-29: 10 mg via INTRAVENOUS
  Filled 2018-06-28: qty 1

## 2018-06-28 MED ORDER — PHENYLEPHRINE 40 MCG/ML (10ML) SYRINGE FOR IV PUSH (FOR BLOOD PRESSURE SUPPORT)
PREFILLED_SYRINGE | INTRAVENOUS | Status: AC
  Filled 2018-06-28: qty 10

## 2018-06-28 MED ORDER — LIDOCAINE 2% (20 MG/ML) 5 ML SYRINGE
INTRAMUSCULAR | Status: AC
  Filled 2018-06-28: qty 5

## 2018-06-28 MED ORDER — FENTANYL CITRATE (PF) 100 MCG/2ML IJ SOLN
INTRAMUSCULAR | Status: DC | PRN
  Administered 2018-06-28: 50 ug via INTRAVENOUS

## 2018-06-28 MED ORDER — ONDANSETRON HCL 4 MG/2ML IJ SOLN: 4.0000 mg | Freq: Four times a day (QID) | INTRAMUSCULAR | Status: DC | PRN

## 2018-06-28 MED ORDER — BISACODYL 10 MG RE SUPP: 10.0000 mg | Freq: Every day | RECTAL | Status: DC | PRN

## 2018-06-28 MED ORDER — CELECOXIB 200 MG PO CAPS
200.0000 mg | ORAL_CAPSULE | Freq: Two times a day (BID) | ORAL | Status: DC
  Administered 2018-06-28 – 2018-06-29 (×2): 200 mg via ORAL
  Filled 2018-06-28 (×2): qty 1

## 2018-06-28 MED ORDER — BUPIVACAINE HCL (PF) 0.25 % IJ SOLN
INTRAMUSCULAR | Status: DC | PRN
  Administered 2018-06-28: 30 mL

## 2018-06-28 MED ORDER — CLONIDINE HCL (ANALGESIA) 100 MCG/ML EP SOLN
EPIDURAL | Status: DC | PRN
  Administered 2018-06-28: 50 ug

## 2018-06-28 MED ORDER — VANCOMYCIN HCL 1000 MG IV SOLR
INTRAVENOUS | Status: AC
  Filled 2018-06-28: qty 1000

## 2018-06-28 MED ORDER — DEXAMETHASONE SODIUM PHOSPHATE 10 MG/ML IJ SOLN: 10.0000 mg | Freq: Once | INTRAMUSCULAR | Status: DC

## 2018-06-28 MED ORDER — BUPIVACAINE HCL (PF) 0.25 % IJ SOLN
INTRAMUSCULAR | Status: AC
  Filled 2018-06-28: qty 30

## 2018-06-28 MED ORDER — SODIUM CHLORIDE 0.9 % IR SOLN
Status: DC | PRN
  Administered 2018-06-28: 1000 mL

## 2018-06-28 MED ORDER — LACTATED RINGERS IV SOLN
INTRAVENOUS | Status: DC
  Administered 2018-06-28 (×2): via INTRAVENOUS

## 2018-06-28 MED ORDER — METOCLOPRAMIDE HCL 5 MG/ML IJ SOLN: 5.0000 mg | Freq: Three times a day (TID) | INTRAMUSCULAR | Status: DC | PRN

## 2018-06-28 MED ORDER — DOCUSATE SODIUM 100 MG PO CAPS: 100.0000 mg | ORAL_CAPSULE | Freq: Two times a day (BID) | ORAL | 0 refills | Status: DC

## 2018-06-28 MED ORDER — ROPIVACAINE HCL 7.5 MG/ML IJ SOLN
INTRAMUSCULAR | Status: DC | PRN
  Administered 2018-06-28: 20 mL via PERINEURAL

## 2018-06-28 MED ORDER — TRANEXAMIC ACID-NACL 1000-0.7 MG/100ML-% IV SOLN
1000.0000 mg | INTRAVENOUS | Status: AC
  Administered 2018-06-28: 1000 mg via INTRAVENOUS
  Filled 2018-06-28: qty 100

## 2018-06-28 MED ORDER — METFORMIN HCL ER 500 MG PO TB24
500.0000 mg | ORAL_TABLET | Freq: Two times a day (BID) | ORAL | Status: DC
  Administered 2018-06-28 – 2018-06-29 (×2): 500 mg via ORAL
  Filled 2018-06-28 (×2): qty 1

## 2018-06-28 MED ORDER — ROSUVASTATIN CALCIUM 10 MG PO TABS
10.0000 mg | ORAL_TABLET | Freq: Every day | ORAL | Status: DC
  Administered 2018-06-29: 10 mg via ORAL
  Filled 2018-06-28: qty 1

## 2018-06-28 MED ORDER — PHENYLEPHRINE 40 MCG/ML (10ML) SYRINGE FOR IV PUSH (FOR BLOOD PRESSURE SUPPORT)
PREFILLED_SYRINGE | INTRAVENOUS | Status: DC | PRN
  Administered 2018-06-28 (×5): 80 ug via INTRAVENOUS

## 2018-06-28 MED ORDER — FERROUS SULFATE 325 (65 FE) MG PO TABS
325.0000 mg | ORAL_TABLET | Freq: Two times a day (BID) | ORAL | Status: DC
  Administered 2018-06-28 – 2018-06-29 (×2): 325 mg via ORAL
  Filled 2018-06-28 (×2): qty 1

## 2018-06-28 MED ORDER — KETOROLAC TROMETHAMINE 30 MG/ML IJ SOLN
INTRAMUSCULAR | Status: AC
  Filled 2018-06-28: qty 1

## 2018-06-28 MED ORDER — MAGNESIUM CITRATE PO SOLN: 1.0000 | Freq: Once | ORAL | Status: DC | PRN

## 2018-06-28 MED ORDER — PROPOFOL 10 MG/ML IV BOLUS
INTRAVENOUS | Status: AC
  Filled 2018-06-28: qty 20

## 2018-06-28 MED ORDER — METHOCARBAMOL 500 MG PO TABS: 500.0000 mg | ORAL_TABLET | Freq: Four times a day (QID) | ORAL | 0 refills | Status: DC | PRN

## 2018-06-28 MED ORDER — SODIUM CHLORIDE 0.9 % IV SOLN
INTRAVENOUS | Status: DC
  Administered 2018-06-28: 11:00:00 via INTRAVENOUS

## 2018-06-28 MED ORDER — DEXAMETHASONE SODIUM PHOSPHATE 10 MG/ML IJ SOLN
INTRAMUSCULAR | Status: DC | PRN
  Administered 2018-06-28: 10 mg via INTRAVENOUS

## 2018-06-28 MED ORDER — POLYETHYLENE GLYCOL 3350 17 G PO PACK: 17.0000 g | PACK | Freq: Two times a day (BID) | ORAL | 0 refills | Status: DC

## 2018-06-28 MED ORDER — DEXAMETHASONE SODIUM PHOSPHATE 10 MG/ML IJ SOLN
INTRAMUSCULAR | Status: AC
  Filled 2018-06-28: qty 1

## 2018-06-28 MED ORDER — PROMETHAZINE HCL 25 MG/ML IJ SOLN: 6.2500 mg | INTRAMUSCULAR | Status: DC | PRN

## 2018-06-28 MED ORDER — METOCLOPRAMIDE HCL 5 MG PO TABS: 5.0000 mg | ORAL_TABLET | Freq: Three times a day (TID) | ORAL | Status: DC | PRN

## 2018-06-28 MED ORDER — SODIUM CHLORIDE 0.9 % IJ SOLN
INTRAMUSCULAR | Status: AC
  Filled 2018-06-28: qty 50

## 2018-06-28 MED ORDER — BUPIVACAINE IN DEXTROSE 0.75-8.25 % IT SOLN
INTRATHECAL | Status: DC | PRN
  Administered 2018-06-28: 1.8 mL via INTRATHECAL

## 2018-06-28 MED ORDER — OXYCODONE HCL 5 MG PO TABS
10.0000 mg | ORAL_TABLET | ORAL | Status: DC | PRN
  Administered 2018-06-28 – 2018-06-29 (×4): 10 mg via ORAL
  Filled 2018-06-28 (×4): qty 2

## 2018-06-28 MED ORDER — TRANEXAMIC ACID-NACL 1000-0.7 MG/100ML-% IV SOLN
1000.0000 mg | Freq: Once | INTRAVENOUS | Status: AC
  Administered 2018-06-28: 1000 mg via INTRAVENOUS
  Filled 2018-06-28: qty 100

## 2018-06-28 MED ORDER — DIPHENHYDRAMINE HCL 12.5 MG/5ML PO ELIX: 12.5000 mg | ORAL_SOLUTION | ORAL | Status: DC | PRN

## 2018-06-28 MED ORDER — ACETAMINOPHEN 500 MG PO TABS: 1000.0000 mg | ORAL_TABLET | Freq: Three times a day (TID) | ORAL | 0 refills | Status: DC

## 2018-06-28 MED ORDER — POLYETHYLENE GLYCOL 3350 17 G PO PACK
17.0000 g | PACK | Freq: Two times a day (BID) | ORAL | Status: DC
  Filled 2018-06-28 (×2): qty 1

## 2018-06-28 MED ORDER — PROPOFOL 10 MG/ML IV BOLUS
INTRAVENOUS | Status: DC | PRN
  Administered 2018-06-28: 40 mg via INTRAVENOUS

## 2018-06-28 MED ORDER — KETOROLAC TROMETHAMINE 30 MG/ML IJ SOLN
INTRAMUSCULAR | Status: DC | PRN
  Administered 2018-06-28: 30 mg

## 2018-06-28 MED ORDER — ONDANSETRON HCL 4 MG/2ML IJ SOLN
INTRAMUSCULAR | Status: DC | PRN
  Administered 2018-06-28: 4 mg via INTRAVENOUS

## 2018-06-28 MED ORDER — MENTHOL 3 MG MT LOZG: 1.0000 | LOZENGE | OROMUCOSAL | Status: DC | PRN

## 2018-06-28 MED ORDER — CARVEDILOL 12.5 MG PO TABS
12.5000 mg | ORAL_TABLET | Freq: Two times a day (BID) | ORAL | Status: DC
  Administered 2018-06-28 – 2018-06-29 (×2): 12.5 mg via ORAL
  Filled 2018-06-28 (×2): qty 1

## 2018-06-28 MED ORDER — ACETAMINOPHEN 500 MG PO TABS
1000.0000 mg | ORAL_TABLET | Freq: Four times a day (QID) | ORAL | Status: AC
  Administered 2018-06-28 – 2018-06-29 (×4): 1000 mg via ORAL
  Filled 2018-06-28 (×4): qty 2

## 2018-06-28 MED ORDER — PHENOL 1.4 % MT LIQD
1.0000 | OROMUCOSAL | Status: DC | PRN
  Filled 2018-06-28: qty 177

## 2018-06-28 MED ORDER — ONDANSETRON HCL 4 MG/2ML IJ SOLN
INTRAMUSCULAR | Status: AC
  Filled 2018-06-28: qty 2

## 2018-06-28 MED ORDER — OXYMETAZOLINE HCL 0.05 % NA SOLN
1.0000 | Freq: Every day | NASAL | Status: DC
  Filled 2018-06-28: qty 15

## 2018-06-28 MED ORDER — ONDANSETRON HCL 4 MG PO TABS: 4.0000 mg | ORAL_TABLET | Freq: Four times a day (QID) | ORAL | Status: DC | PRN

## 2018-06-28 MED ORDER — CEFAZOLIN SODIUM-DEXTROSE 2-4 GM/100ML-% IV SOLN
2.0000 g | Freq: Four times a day (QID) | INTRAVENOUS | Status: AC
  Administered 2018-06-28 (×2): 2 g via INTRAVENOUS
  Filled 2018-06-28 (×2): qty 100

## 2018-06-28 MED ORDER — MIDAZOLAM HCL 5 MG/5ML IJ SOLN
INTRAMUSCULAR | Status: DC | PRN
  Administered 2018-06-28: 2 mg via INTRAVENOUS

## 2018-06-28 MED ORDER — METHOCARBAMOL 500 MG PO TABS
500.0000 mg | ORAL_TABLET | Freq: Four times a day (QID) | ORAL | Status: DC | PRN
  Administered 2018-06-28 – 2018-06-29 (×3): 500 mg via ORAL
  Filled 2018-06-28 (×3): qty 1

## 2018-06-28 MED ORDER — SODIUM CHLORIDE 0.9 % IJ SOLN
INTRAMUSCULAR | Status: DC | PRN
  Administered 2018-06-28: 30 mL

## 2018-06-28 MED ORDER — RIVAROXABAN 10 MG PO TABS
10.0000 mg | ORAL_TABLET | ORAL | Status: DC
  Administered 2018-06-29: 10 mg via ORAL
  Filled 2018-06-28: qty 1

## 2018-06-28 MED ORDER — PANTOPRAZOLE SODIUM 40 MG PO TBEC
40.0000 mg | DELAYED_RELEASE_TABLET | ORAL | Status: DC
  Administered 2018-06-28: 40 mg via ORAL
  Filled 2018-06-28: qty 1

## 2018-06-28 MED ORDER — OXYCODONE HCL 5 MG PO TABS
5.0000 mg | ORAL_TABLET | ORAL | Status: DC | PRN
  Administered 2018-06-28: 5 mg via ORAL
  Filled 2018-06-28: qty 1

## 2018-06-28 MED ORDER — TOBRAMYCIN SULFATE 1.2 G IJ SOLR
INTRAMUSCULAR | Status: AC
  Filled 2018-06-28: qty 1.2

## 2018-06-28 MED ORDER — FERROUS SULFATE 325 (65 FE) MG PO TABS: 325.0000 mg | ORAL_TABLET | Freq: Three times a day (TID) | ORAL | 3 refills | Status: DC

## 2018-06-28 SURGICAL SUPPLY — 53 items
ATTUNE MED ANAT PAT 38 KNEE (Knees) ×2 IMPLANT
ATTUNE PS FEM LT SZ 5 CEM KNEE (Femur) ×2 IMPLANT
ATTUNE PSRP INSR SZ5 6 KNEE (Insert) ×2 IMPLANT
BAG ZIPLOCK 12X15 (MISCELLANEOUS) IMPLANT
BANDAGE ACE 6X5 VEL STRL LF (GAUZE/BANDAGES/DRESSINGS) ×2 IMPLANT
BASE TIBIA ATTUNE KNEE SYS SZ6 (Knees) ×1 IMPLANT
BLADE SAW SGTL 11.0X1.19X90.0M (BLADE) IMPLANT
BLADE SAW SGTL 13.0X1.19X90.0M (BLADE) ×2 IMPLANT
BNDG ELASTIC 6X15 VLCR STRL LF (GAUZE/BANDAGES/DRESSINGS) ×2 IMPLANT
BOWL SMART MIX CTS (DISPOSABLE) ×2 IMPLANT
CEMENT HV SMART SET (Cement) ×4 IMPLANT
COVER SURGICAL LIGHT HANDLE (MISCELLANEOUS) ×2 IMPLANT
COVER WAND RF STERILE (DRAPES) ×2 IMPLANT
CUFF TOURN SGL QUICK 34 (TOURNIQUET CUFF) ×1
CUFF TRNQT CYL 34X4X40X1 (TOURNIQUET CUFF) ×1 IMPLANT
DECANTER SPIKE VIAL GLASS SM (MISCELLANEOUS) ×4 IMPLANT
DERMABOND ADVANCED (GAUZE/BANDAGES/DRESSINGS) ×1
DERMABOND ADVANCED .7 DNX12 (GAUZE/BANDAGES/DRESSINGS) ×1 IMPLANT
DRAPE U-SHAPE 47X51 STRL (DRAPES) ×2 IMPLANT
DRESSING AQUACEL AG SP 3.5X10 (GAUZE/BANDAGES/DRESSINGS) ×1 IMPLANT
DRSG AQUACEL AG SP 3.5X10 (GAUZE/BANDAGES/DRESSINGS) ×2
DURAPREP 26ML APPLICATOR (WOUND CARE) ×4 IMPLANT
ELECT REM PT RETURN 15FT ADLT (MISCELLANEOUS) ×2 IMPLANT
GLOVE BIOGEL M 7.0 STRL (GLOVE) IMPLANT
GLOVE BIOGEL PI IND STRL 7.5 (GLOVE) ×1 IMPLANT
GLOVE BIOGEL PI IND STRL 8.5 (GLOVE) ×1 IMPLANT
GLOVE BIOGEL PI INDICATOR 7.5 (GLOVE) ×1
GLOVE BIOGEL PI INDICATOR 8.5 (GLOVE) ×1
GLOVE ECLIPSE 8.0 STRL XLNG CF (GLOVE) ×2 IMPLANT
GLOVE ORTHO TXT STRL SZ7.5 (GLOVE) ×4 IMPLANT
GOWN STRL REUS W/TWL 2XL LVL3 (GOWN DISPOSABLE) ×2 IMPLANT
GOWN STRL REUS W/TWL LRG LVL3 (GOWN DISPOSABLE) ×2 IMPLANT
HANDPIECE INTERPULSE COAX TIP (DISPOSABLE) ×1
HOLDER FOLEY CATH W/STRAP (MISCELLANEOUS) IMPLANT
MANIFOLD NEPTUNE II (INSTRUMENTS) ×2 IMPLANT
NDL SAFETY ECLIPSE 18X1.5 (NEEDLE) IMPLANT
NEEDLE HYPO 18GX1.5 SHARP (NEEDLE)
PACK TOTAL KNEE CUSTOM (KITS) ×2 IMPLANT
PIN FIX SIGMA HP QUICK REL (PIN) ×2 IMPLANT
POSITIONER SURGICAL ARM (MISCELLANEOUS) ×2 IMPLANT
SET HNDPC FAN SPRY TIP SCT (DISPOSABLE) ×1 IMPLANT
SET PAD KNEE POSITIONER (MISCELLANEOUS) ×2 IMPLANT
SUT MNCRL AB 4-0 PS2 18 (SUTURE) ×2 IMPLANT
SUT STRATAFIX PDS+ 0 24IN (SUTURE) ×2 IMPLANT
SUT VIC AB 1 CT1 36 (SUTURE) ×2 IMPLANT
SUT VIC AB 2-0 CT1 27 (SUTURE) ×3
SUT VIC AB 2-0 CT1 TAPERPNT 27 (SUTURE) ×3 IMPLANT
SYRINGE 3CC LL L/F (MISCELLANEOUS) ×2 IMPLANT
TIBIA ATTUNE KNEE SYS BASE SZ6 (Knees) ×2 IMPLANT
TRAY FOLEY MTR SLVR 16FR STAT (SET/KITS/TRAYS/PACK) ×2 IMPLANT
WATER STERILE IRR 1000ML POUR (IV SOLUTION) ×4 IMPLANT
WRAP KNEE MAXI GEL POST OP (GAUZE/BANDAGES/DRESSINGS) ×2 IMPLANT
YANKAUER SUCT BULB TIP 10FT TU (MISCELLANEOUS) ×2 IMPLANT

## 2018-06-28 NOTE — Interval H&P Note (Signed)
History and Physical Interval Note:  06/28/2018 6:35 AM  [name].  has presented today for surgery, with the diagnosis of Left knee osteoarthritis  The various methods of treatment have been discussed with the patient and family. After consideration of risks, benefits and other options for treatment, the patient has consented to  Procedure(s) with comments: LEFT TOTAL KNEE ARTHROPLASTY (Left) - 70 mins as a surgical intervention .  The patient's history has been reviewed, patient examined, no change in status, stable for surgery.  I have reviewed the patient's chart and labs.  Questions were answered to the patient's satisfaction.     Mauri Pole

## 2018-06-28 NOTE — Transfer of Care (Signed)
Immediate Anesthesia Transfer of Care Note  Patient: [name].  Procedure(s) Performed: LEFT TOTAL KNEE ARTHROPLASTY (Left Knee)  Patient Location: PACU  Anesthesia Type:Spinal  Level of Consciousness: awake and alert   Airway & Oxygen Therapy: Patient Spontanous Breathing and Patient connected to nasal cannula oxygen  Post-op Assessment: Report given to RN and Post -op Vital signs reviewed and stable  Post vital signs: Reviewed and stable  Last Vitals:  Vitals Value Taken Time  BP    Temp    Pulse    Resp    SpO2      Last Pain:  Vitals:   06/28/18 0552  TempSrc: Oral      Patients Stated Pain Goal: 3 (66/44/03 4742)  Complications: No apparent anesthesia complications

## 2018-06-28 NOTE — Evaluation (Signed)
Physical Therapy Evaluation Patient Details Name: [name]. MRN: [id number] DOB: Nov 04, 1960 Today's Date: 06/28/2018   History of Present Illness  Pt is a 57 YO male s/p L TKR on 06/28/18. R TKR on 05/31/18. PMH includes tubular adenoma of colon 2019, BCE, DM II with neuropathy, permanant afib, polycythemia vera 2016, HLD, HTN TIA, GERD, obesity, OSA, cardioversion, L THA 2012.  Clinical Impression   Pt presents with LLE pain especially with ambulation, decreased LLE ROM, difficulty performing bed mobility, and decreased tolerance for ambulation secondary to pain. Pt to benefit from acute PT to address deficits. Pt ambulated 60 ft with RW with min guard assist. Pt defers physical assist from PT and is very independent. PT to progress mobility as tolerated, and will continue to follow acutely.      Follow Up Recommendations Follow surgeon's recommendation for DC plan and follow-up therapies;Supervision for mobility/OOB(OPPT)    Equipment Recommendations  None recommended by PT    Recommendations for Other Services       Precautions / Restrictions Precautions Precautions: Fall Restrictions Weight Bearing Restrictions: No Other Position/Activity Restrictions: WBAT       Mobility  Bed Mobility Overal bed mobility: Needs Assistance Bed Mobility: Supine to Sit;Sit to Supine     Supine to sit: Min assist;HOB elevated     General bed mobility comments: Min assist for LLE management. Increased time/effort to get to EOB.   Transfers Overall transfer level: Needs assistance Equipment used: Rolling walker (2 wheeled) Transfers: Sit to/from Stand Sit to Stand: Min guard;From elevated surface         General transfer comment: Min guard for safety. Verbal cuing for hand placement x2, pt did not want physical asssit with mobility.   Ambulation/Gait Ambulation/Gait assistance: Min guard;Min assist Gait Distance (Feet): 60 Feet Assistive device: Rolling walker (2  wheeled) Gait Pattern/deviations: Step-to pattern;Decreased stride length;Decreased stance time - left;Decreased weight shift to left;Trunk flexed;Antalgic Gait velocity: decr    General Gait Details: Pt with increased pain with ambulation. Verbal cuing for sequencing, placement in RW. Min assist initially for ambulation to guard LLE, but no buckling present. Transitioned to min guard for safety.  Stairs            Wheelchair Mobility    Modified Rankin (Stroke Patients Only)       Balance Overall balance assessment: Mild deficits observed, not formally tested                                           Pertinent Vitals/Pain Pain Assessment: 0-10 Pain Score: 4  Pain Location: L knee  Pain Descriptors / Indicators: Aching Pain Intervention(s): Limited activity within patient's tolerance;Ice applied;Repositioned;Monitored during session    [place] expects to be discharged to:: Private residence Living Arrangements: Spouse/significant other Available Help at Discharge: Family;Available PRN/intermittently Type of Home: House Home Access: Stairs to enter Entrance Stairs-Rails: Right;Left;Can reach both Entrance Stairs-Number of Steps: 5+1 Home Layout: Able to live on main level with bedroom/bathroom;Two level Home Equipment: Walker - 2 wheels;Walker - 4 wheels;Crutches;Cane - single point;Grab bars - tub/shower;Bedside commode;Shower seat;Other (comment)(walking stick )      Prior Function Level of Independence: Independent         Comments: Pt had not been using AD 1.5 weeks prior to admission     Hand Dominance   Dominant Hand: Right  Extremity/Trunk Assessment   Upper Extremity Assessment Upper Extremity Assessment: Overall WFL for tasks assessed    Lower Extremity Assessment Lower Extremity Assessment: Overall WFL for tasks assessed;LLE deficits/detail RLE Deficits / Details: suspected post-surgical weakness; able to  perform quad set, SLR with assist, ankle pumps  RLE Sensation: WNL    Cervical / Trunk Assessment Cervical / Trunk Assessment: Normal  Communication   Communication: No difficulties  Cognition Arousal/Alertness: Awake/alert Behavior During Therapy: WFL for tasks assessed/performed Overall Cognitive Status: Within Functional Limits for tasks assessed                                        General Comments      Exercises     Assessment/Plan    PT Assessment Patient needs continued PT services  PT Problem List Decreased strength;Pain;Decreased activity tolerance;Decreased knowledge of use of DME;Decreased range of motion;Decreased balance;Decreased safety awareness;Decreased mobility       PT Treatment Interventions DME instruction;Therapeutic activities;Gait training;Therapeutic exercise;Patient/family education;Stair training;Balance training;Functional mobility training    PT Goals (Current goals can be found in the Care Plan section)  Acute Rehab PT Goals Patient Stated Goal: none stated  PT Goal Formulation: With patient Time For Goal Achievement: 07/05/18 Potential to Achieve Goals: Good    Frequency 7X/week   Barriers to discharge        Co-evaluation               AM-PAC PT "6 Clicks" Daily Activity  Outcome Measure Difficulty turning over in bed (including adjusting bedclothes, sheets and blankets)?: Unable Difficulty moving from lying on back to sitting on the side of the bed? : Unable Difficulty sitting down on and standing up from a chair with arms (e.g., wheelchair, bedside commode, etc,.)?: Unable Help needed moving to and from a bed to chair (including a wheelchair)?: A Little Help needed walking in hospital room?: A Little Help needed climbing 3-5 steps with a railing? : A Little 6 Click Score: 12    End of Session Equipment Utilized During Treatment: Gait belt Activity Tolerance: Patient tolerated treatment well Patient  left: with call bell/phone within reach;with family/visitor present;in bed;with bed alarm set;with SCD's reapplied Nurse Communication: Mobility status PT Visit Diagnosis: Other abnormalities of gait and mobility (R26.89);Difficulty in walking, not elsewhere classified (R26.2)    Time: 0962-8366 PT Time Calculation (min) (ACUTE ONLY): 30 min   Charges:   PT Evaluation $PT Eval Low Complexity: 1 Low PT Treatments $Gait Training: 8-22 mins        Julien Girt, PT Acute Rehabilitation Services Pager [phone]  Office [phone]   Davine Coba D Elonda Husky 06/28/2018, 3:35 PM

## 2018-06-28 NOTE — Discharge Instructions (Addendum)

## 2018-06-28 NOTE — Anesthesia Procedure Notes (Signed)
Date/Time: 06/28/2018 7:17 AM Performed by: Sharlette Dense, CRNA Patient Re-evaluated:Patient Re-evaluated prior to induction Oxygen Delivery Method: Simple face mask

## 2018-06-28 NOTE — Plan of Care (Signed)
Plan of care reviewed and discussed with patient.   

## 2018-06-28 NOTE — Anesthesia Preprocedure Evaluation (Addendum)
Anesthesia Evaluation  Patient identified by MRN, date of birth, ID band Patient awake    Reviewed: Allergy & Precautions, NPO status , Patient's Chart, lab work & pertinent test results  Airway Mallampati: III  TM Distance: <3 FB Neck ROM: Limited    Dental no notable dental hx.    Pulmonary sleep apnea and Continuous Positive Airway Pressure Ventilation ,    Pulmonary exam normal breath sounds clear to auscultation       Cardiovascular hypertension, + dysrhythmias Atrial Fibrillation  Rhythm:Irregular Rate:Normal     Neuro/Psych negative neurological ROS  negative psych ROS   GI/Hepatic negative GI ROS, Neg liver ROS,   Endo/Other  diabetesMorbid obesity  Renal/GU negative Renal ROS  negative genitourinary   Musculoskeletal negative musculoskeletal ROS (+)   Abdominal   Peds negative pediatric ROS (+)  Hematology negative hematology ROS (+)   Anesthesia Other Findings   Reproductive/Obstetrics negative OB ROS                            Anesthesia Physical Anesthesia Plan  ASA: III  Anesthesia Plan: Spinal   Post-op Pain Management:    Induction: Intravenous  PONV Risk Score and Plan: 1 and Ondansetron and Treatment may vary due to age or medical condition  Airway Management Planned: Simple Face Mask  Additional Equipment:   Intra-op Plan:   Post-operative Plan:   Informed Consent: I have reviewed the patients History and Physical, chart, labs and discussed the procedure including the risks, benefits and alternatives for the proposed anesthesia with the patient or authorized representative who has indicated his/her understanding and acceptance.   Dental advisory given  Plan Discussed with: CRNA and Surgeon  Anesthesia Plan Comments:         Anesthesia Quick Evaluation

## 2018-06-28 NOTE — Anesthesia Procedure Notes (Signed)
Anesthesia Regional Block: Adductor canal block   Pre-Anesthetic Checklist: ,, timeout performed, Correct Patient, Correct Site, Correct Laterality, Correct Procedure, Correct Position, site marked, Risks and benefits discussed,  Surgical consent,  Pre-op evaluation,  At surgeon's request and post-op pain management  Laterality: Left  Prep: chloraprep       Needles:  Injection technique: Single-shot  Needle Type: Echogenic Needle     Needle Length: 9cm      Additional Needles:   Procedures:,,,, ultrasound used (permanent image in chart),,,,  Narrative:  Start time: 06/28/2018 6:51 AM End time: 06/28/2018 6:58 AM Injection made incrementally with aspirations every 5 mL.  Performed by: Personally  Anesthesiologist: Myrtie Soman, MD  Additional Notes: Patient tolerated the procedure well without complications

## 2018-06-28 NOTE — Op Note (Signed)
NAME:  [name].                      MEDICAL RECORD NO.:  [id number]                             FACILITY:  [facility]      PHYSICIAN:  Pietro Cassis. Alvan Dame, M.D.  DATE OF BIRTH:  February 11, 1961      DATE OF PROCEDURE:  06/28/2018                                     OPERATIVE REPORT         PREOPERATIVE DIAGNOSIS:  Left knee osteoarthritis.      POSTOPERATIVE DIAGNOSIS:  Left knee osteoarthritis.      FINDINGS:  The patient was noted to have complete loss of cartilage and   bone-on-bone arthritis with associated osteophytes in all three compartments of   the knee with a significant synovitis and associated effusion.  The patient had failed months of conservative treatment including medications, injection therapy, activity modification.     PROCEDURE:  Left total knee replacement.      COMPONENTS USED:  DePuy Attune rotating platform posterior stabilized knee   system, a size 5 femur, size 6 tibia, size 6 mm PS AOX insert, and 38 anatomic patellar   button.      SURGEON:  Pietro Cassis. Alvan Dame, M.D.      ASSISTANT:  Danae Orleans, PA-C.      ANESTHESIA:  Regional and Spinal.      SPECIMENS:  None.      COMPLICATION:  None.      DRAINS:  None.  EBL: <100cc      TOURNIQUET TIME:  * Missing tourniquet times found for documented tourniquets in log: [id number] * Total Tourniquet Time Documented: Thigh (Left) - 27 minutes Total: Thigh (Left) - 27 minutes  .      The patient was stable to the recovery room.      INDICATION FOR PROCEDURE:  [name]. is a 57 y.o. male patient of   mine.  The patient had been seen, evaluated, and treated for months conservatively in the   office with medication, activity modification, and injections.  The patient had   radiographic changes of bone-on-bone arthritis with endplate sclerosis and osteophytes noted.  Based on the radiographic changes and failed conservative measures, the patient   decided to proceed with definitive treatment, total  knee replacement.  Risks of infection, DVT, component failure, need for revision surgery, neurovascular injury were reviewed in the office setting.  The postop course was reviewed stressing the efforts to maximize post-operative satisfaction and function.  Consent was obtained for benefit of pain   relief.      PROCEDURE IN DETAIL:  The patient was brought to the operative theater.   Once adequate anesthesia, preoperative antibiotics, 3 gm of Ancef,1 gm of Tranexamic Acid, and 10 mg of Decadron administered, the patient was positioned supine with a left thigh tourniquet placed.  The  left lower extremity was prepped and draped in sterile fashion.  A time-   out was performed identifying the patient, planned procedure, and the appropriate extremity.      The left lower extremity was placed in the [facility] leg holder.  The leg was   exsanguinated, tourniquet  elevated to 250 mmHg.  A midline incision was   made followed by median parapatellar arthrotomy.  Following initial   exposure, attention was first directed to the patella.  Precut   measurement was noted to be 24 mm.  I resected down to 14 mm and used a   38 anatomic patellar button to restore patellar height as well as cover the cut surface.      The lug holes were drilled and a metal shim was placed to protect the   patella from retractors and saw blade during the procedure.      At this point, attention was now directed to the femur.  The femoral   canal was opened with a drill, irrigated to try to prevent fat emboli.  An   intramedullary rod was passed at 5 degrees valgus, 9 mm of bone was   resected off the distal femur.  Following this resection, the tibia was   subluxated anteriorly.  Using the extramedullary guide, 2 mm of bone was resected off   the proximal medial tibia.  We confirmed the gap would be   stable medially and laterally with a size 5 spacer block as well as confirmed that the tibial cut was perpendicular in the coronal  plane, checking with an alignment rod.      Once this was done, I sized the femur to be a size 5 in the anterior-   posterior dimension, chose a standard component based on medial and   lateral dimension.  The size 5 rotation block was then pinned in   position anterior referenced using the C-clamp to set rotation.  The   anterior, posterior, and  chamfer cuts were made without difficulty nor   notching making certain that I was along the anterior cortex to help   with flexion gap stability.      The final box cut was made off the lateral aspect of distal femur.      At this point, the tibia was sized to be a size 6.  The size 6 tray was   then pinned in position through the medial third of the tubercle,   drilled, and keel punched.  Trial reduction was now carried with a 5 femur,  6 tibia, a size 6 mm PS insert, and the 38 anatomic patella botton.  The knee was brought to full extension with good flexion stability with the patella   tracking through the trochlea without application of pressure.  Given   all these findings the trial components removed.  Final components were   opened and cement was mixed.  The knee was irrigated with normal saline solution and pulse lavage.  The synovial lining was   then injected with 30 cc of 0.25% Marcaine with epinephrine, 1 cc of Toradol and 30 cc of NS for a total of 61 cc.     Final implants were then cemented onto cleaned and dried cut surfaces of bone with the knee brought to extension with a size 6 mm PS trial insert.      Once the cement had fully cured, excess cement was removed   throughout the knee.  I confirmed that I was satisfied with the range of   motion and stability, and the final size 6 mm PS AOX insert was chosen.  It was   placed into the knee.      The tourniquet had been let down at 27 minutes.  No significant   hemostasis was  required.  The extensor mechanism was then reapproximated using #1 Vicryl and #1 Stratafix sutures with  the knee   in flexion.  The   remaining wound was closed with 2-0 Vicryl and running 4-0 Monocryl.   The knee was cleaned, dried, dressed sterilely using Dermabond and   Aquacel dressing.  The patient was then   brought to recovery room in stable condition, tolerating the procedure   well.   Please note that Physician Assistant, Danae Orleans, PA-C was present for the entirety of the case, and was utilized for pre-operative positioning, peri-operative retractor management, general facilitation of the procedure and for primary wound closure at the end of the case.              Pietro Cassis Alvan Dame, M.D.    06/28/2018 8:30 AM

## 2018-06-28 NOTE — Anesthesia Postprocedure Evaluation (Signed)
Anesthesia Post Note  Patient: [name].  Procedure(s) Performed: LEFT TOTAL KNEE ARTHROPLASTY (Left Knee)     Patient location during evaluation: PACU Anesthesia Type: Spinal Level of consciousness: oriented and awake and alert Pain management: pain level controlled Vital Signs Assessment: post-procedure vital signs reviewed and stable Respiratory status: spontaneous breathing, respiratory function stable and patient connected to nasal cannula oxygen Cardiovascular status: blood pressure returned to baseline and stable Postop Assessment: no headache, no backache and no apparent nausea or vomiting Anesthetic complications: no    Last Vitals:  Vitals:   06/28/18 1130 06/28/18 1400  BP: (!) 143/87 (!) 137/93  Pulse: (!) 54 71  Resp: 18 18  Temp: 36.7 C 36.8 C  SpO2: 100% 97%    Last Pain:  Vitals:   06/28/18 1400  TempSrc: Oral  PainSc: 4                  [name] S

## 2018-06-28 NOTE — Anesthesia Procedure Notes (Signed)
Spinal  Patient location during procedure: OR Start time: 06/28/2018 7:18 AM End time: 06/28/2018 7:23 AM Staffing Anesthesiologist: Rose, George, MD Performed: anesthesiologist  Preanesthetic Checklist Completed: patient identified, site marked, surgical consent, pre-op evaluation, timeout performed, IV checked, risks and benefits discussed and monitors and equipment checked Spinal Block Patient position: sitting Prep: ChloraPrep Patient monitoring: heart rate, continuous pulse ox and blood pressure Location: L3-4 Injection technique: single-shot Needle Needle type: Sprotte  Needle gauge: 24 G Needle length: 9 cm Additional Notes Expiration date of kit checked and confirmed. Patient tolerated procedure well, without complications.       

## 2018-06-28 NOTE — Anesthesia Procedure Notes (Signed)
Anesthesia Procedure Image    

## 2018-06-29 LAB — BASIC METABOLIC PANEL
ANION GAP: 5 (ref 5–15)
BUN: 18 mg/dL (ref 6–20)
CHLORIDE: 105 mmol/L (ref 98–111)
CO2: 24 mmol/L (ref 22–32)
Calcium: 8.8 mg/dL — ABNORMAL LOW (ref 8.9–10.3)
Creatinine, Ser: 1.13 mg/dL (ref 0.61–1.24)
GFR calc Af Amer: >60 mL/min (ref >60)
GFR calc non Af Amer: >60 mL/min (ref >60)
Glucose, Bld: 143 mg/dL — ABNORMAL HIGH (ref 70–99)
POTASSIUM: 4.3 mmol/L (ref 3.5–5.1)
Sodium: 134 mmol/L — ABNORMAL LOW (ref 135–145)

## 2018-06-29 LAB — CBC
HCT: 38.1 % — ABNORMAL LOW (ref 39.0–52.0)
HEMOGLOBIN: 13 g/dL (ref 13.0–17.0)
MCH: 31 pg (ref 26.0–34.0)
MCHC: 34.1 g/dL (ref 30.0–36.0)
MCV: 90.7 fL (ref 80.0–100.0)
NRBC: 0 % (ref 0.0–0.2)
Platelets: 155 10*3/uL (ref 150–400)
RBC: 4.2 MIL/uL — AB (ref 4.22–5.81)
RDW: 12.4 % (ref 11.5–15.5)
WBC: 8 10*3/uL (ref 4.0–10.5)

## 2018-06-29 LAB — GLUCOSE, CAPILLARY: Glucose-Capillary: 128 mg/dL — ABNORMAL HIGH (ref 70–99)

## 2018-06-29 MED FILL — oxyCODONE HCL 5 MG TABS: 5 | 5 days supply | Qty: 60 | Fill #0

## 2018-06-29 MED FILL — ROSUVASTATIN CALCIUM 10 MG: 10 | 90 days supply | Qty: 90 | Fill #0

## 2018-06-29 NOTE — Progress Notes (Signed)
Physical Therapy Treatment Patient Details Name: [name]. MRN: [id number] DOB: [birth year] Today's Date: 06/29/2018    History of Present Illness Pt is a 57 YO male s/p L TKR on 06/28/18. R TKR on 05/31/18. PMH includes tubular adenoma of colon 2019, BCE, DM II with neuropathy, permanant afib, polycythemia vera 2016, HLD, HTN TIA, GERD, obesity, OSA, cardioversion, L THA 2012.    PT Comments    POD # 1 am session Assisted with amb a greater distance.  Performed some TE's following HEP handout.  Instructed on proper tech, freq as well as use of ICE.     Follow Up Recommendations  Follow surgeon's recommendation for DC plan and follow-up therapies;Supervision for mobility/OOB     Equipment Recommendations  None recommended by PT    Recommendations for Other Services       Precautions / Restrictions Precautions Precautions: Fall;Knee Restrictions Weight Bearing Restrictions: No Other Position/Activity Restrictions: WBAT     Mobility  Bed Mobility               General bed mobility comments: OOB in recliner   Transfers Overall transfer level: Needs assistance Equipment used: Rolling walker (2 wheeled) Transfers: Sit to/from Stand Sit to Stand: Min guard;From elevated surface         General transfer comment: Min guard for safety.   Ambulation/Gait Ambulation/Gait assistance: Supervision;Min guard Gait Distance (Feet): 75 Feet Assistive device: Rolling walker (2 wheeled) Gait Pattern/deviations: Step-to pattern;Decreased stride length;Decreased stance time - left;Decreased weight shift to left;Trunk flexed;Antalgic Gait velocity: decr    General Gait Details: one VC safety with turns    Marine scientist Rankin (Stroke Patients Only)       Balance                                            Cognition Arousal/Alertness: Awake/alert Behavior During Therapy: WFL for tasks  assessed/performed Overall Cognitive Status: Within Functional Limits for tasks assessed                                        Exercises   Total Knee Replacement TE's 10 reps B LE ankle pumps 10 reps towel squeezes 10 reps knee presses 10 reps heel slides  10 reps SAQ's 10 reps SLR's 10 reps ABD Followed by ICE     General Comments        Pertinent Vitals/Pain Pain Assessment: No/denies pain Pain Score: 3  Pain Location: L knee  Pain Descriptors / Indicators: Aching;Sore;Operative site guarding;Tender Pain Intervention(s): Monitored during session;Premedicated before session;Repositioned;Ice applied    Home Living                      Prior Function            PT Goals (current goals can now be found in the care plan section) Progress towards PT goals: Progressing toward goals    Frequency    7X/week      PT Plan Current plan remains appropriate    Co-evaluation              AM-PAC PT "6 Clicks" Daily Activity  Outcome Measure  Difficulty  turning over in bed (including adjusting bedclothes, sheets and blankets)?: A Little Difficulty moving from lying on back to sitting on the side of the bed? : A Little Difficulty sitting down on and standing up from a chair with arms (e.g., wheelchair, bedside commode, etc,.)?: A Little Help needed moving to and from a bed to chair (including a wheelchair)?: A Little Help needed walking in hospital room?: A Little Help needed climbing 3-5 steps with a railing? : A Little 6 Click Score: 18    End of Session Equipment Utilized During Treatment: Gait belt Activity Tolerance: Patient tolerated treatment well Patient left: with call bell/phone within reach;with family/visitor present;in bed;with bed alarm set;with SCD's reapplied Nurse Communication: Mobility status PT Visit Diagnosis: Other abnormalities of gait and mobility (R26.89);Difficulty in walking, not elsewhere classified (R26.2)      Time: 0388-8280 PT Time Calculation (min) (ACUTE ONLY): 30 min  Charges:  $Gait Training: 8-22 mins $Therapeutic Activity: 8-22 mins                     {Jamarri Vuncannon  PTA Acute  Rehabilitation Services Pager      [phone] Office      [phone]

## 2018-06-29 NOTE — Progress Notes (Signed)
Physical Therapy Treatment Patient Details Name: [name]. MRN: [id number] DOB: 02-16-1961 Today's Date: 06/29/2018    History of Present Illness Pt is a 57 YO male s/p L TKR on 06/28/18. R TKR on 05/31/18. PMH includes tubular adenoma of colon 2019, BCE, DM II with neuropathy, permanant afib, polycythemia vera 2016, HLD, HTN TIA, GERD, obesity, OSA, cardioversion, L THA 2012.    PT Comments    POD # 1 pm session Assisted with amb a greater distance in hallway and practiced stairs.   Pt has met goals to D/C to home.   Follow Up Recommendations  Follow surgeon's recommendation for DC plan and follow-up therapies;Supervision for mobility/OOB     Equipment Recommendations  None recommended by PT    Recommendations for Other Services       Precautions / Restrictions Precautions Precautions: Fall;Knee Restrictions Weight Bearing Restrictions: No Other Position/Activity Restrictions: WBAT     Mobility  Bed Mobility               General bed mobility comments: OOB in recliner   Transfers Overall transfer level: Needs assistance Equipment used: Rolling walker (2 wheeled) Transfers: Sit to/from Stand Sit to Stand: Min guard;From elevated surface         General transfer comment: Min guard for safety.   Ambulation/Gait Ambulation/Gait assistance: Supervision;Min guard Gait Distance (Feet): 125 Feet Assistive device: Rolling walker (2 wheeled) Gait Pattern/deviations: Step-to pattern;Decreased stride length;Decreased stance time - left;Decreased weight shift to left;Trunk flexed;Antalgic Gait velocity: decr    General Gait Details: one VC safety with turns and spouse    Stairs Stairs: Yes Stairs assistance: Supervision;Min guard Stair Management: Step to pattern;Forwards Number of Stairs: 8 General stair comments: pt used one rail partial side step up with the good and down with the bad only needed <25% VC's for proper sequencing   Wheelchair  Mobility    Modified Rankin (Stroke Patients Only)       Balance                                            Cognition Arousal/Alertness: Awake/alert Behavior During Therapy: WFL for tasks assessed/performed Overall Cognitive Status: Within Functional Limits for tasks assessed                                        Exercises      General Comments        Pertinent Vitals/Pain Pain Assessment: No/denies pain Pain Score: 3  Pain Location: L knee  Pain Descriptors / Indicators: Aching;Sore;Operative site guarding;Tender Pain Intervention(s): Monitored during session;Premedicated before session;Repositioned;Ice applied    Home Living                      Prior Function            PT Goals (current goals can now be found in the care plan section) Progress towards PT goals: Progressing toward goals    Frequency    7X/week      PT Plan Current plan remains appropriate    Co-evaluation              AM-PAC PT "6 Clicks" Daily Activity  Outcome Measure  Difficulty turning over in bed (including adjusting bedclothes, sheets and  blankets)?: A Little Difficulty moving from lying on back to sitting on the side of the bed? : A Little Difficulty sitting down on and standing up from a chair with arms (e.g., wheelchair, bedside commode, etc,.)?: A Little Help needed moving to and from a bed to chair (including a wheelchair)?: A Little Help needed walking in hospital room?: A Little Help needed climbing 3-5 steps with a railing? : A Little 6 Click Score: 18    End of Session Equipment Utilized During Treatment: Gait belt Activity Tolerance: Patient tolerated treatment well Patient left: with call bell/phone within reach;with family/visitor present;in bed;with bed alarm set;with SCD's reapplied Nurse Communication: Mobility status PT Visit Diagnosis: Other abnormalities of gait and mobility (R26.89);Difficulty in walking,  not elsewhere classified (R26.2)     Time: 1044-1100 PT Time Calculation (min) (ACUTE ONLY): 16 min  Charges:  $Gait Training: 8-22 mins                     {Neilani Duffee  PTA Acute  Rehabilitation [employer]      [phone] Office      [phone]

## 2018-06-29 NOTE — Progress Notes (Signed)
     Subjective: 1 Day Post-Op Procedure(s) (LRB): LEFT TOTAL KNEE ARTHROPLASTY (Left)   Patient reports pain as mild, pain controlled. No events throughout the night. Working well with PT.  REady to be discharged home.    Patient's anticipated LOS is less than 2 midnights, meeting these requirements: - Younger than 70 - Lives within 1 hour of care - Has a competent adult at home to recover with post-op recover - NO history of  - Chronic pain requiring opiods  - Coronary Artery Disease  - Heart failure  - Heart attack  - Stroke  - Respiratory Failure/COPD  - Renal failure  - Anemia  - Advanced Liver disease    Objective:   VITALS:   Vitals:   06/29/18 0157 06/29/18 0547  BP: (!) 141/92 (!) 158/98  Pulse: 99 67  Resp: 18 18  Temp: 97.7 F (36.5 C) 97.6 F (36.4 C)  SpO2: 99% 100%    Dorsiflexion/Plantar flexion intact Incision: dressing C/D/I No cellulitis present Compartment soft  LABS Recent Labs    06/29/18 0528  HGB 13.0  HCT 38.1*  WBC 8.0  PLT 155    Recent Labs    06/29/18 0528  NA 134*  K 4.3  BUN 18  CREATININE 1.13  GLUCOSE 143*     Assessment/Plan: 1 Day Post-Op Procedure(s) (LRB): LEFT TOTAL KNEE ARTHROPLASTY (Left) Foley cath d/c'ed Advance diet Up with therapy D/C IV fluids Discharge home Follow up in 2 weeks at [facility] Follow up with OLIN,Josclyn Rosales D in 2 weeks.  Contact information:  [facility] [address], [place] [phone]    Obese (BMI 30-39.9) Estimated body mass index is 38.28 kg/m as calculated from the following:   Height as of this encounter: 5' 9.76" (1.772 m).   Weight as of this encounter: 120.2 kg. Patient also counseled that weight may inhibit the healing process Patient counseled that losing weight will help with future health issues       West Pugh. Cuahutemoc Attar   PAC  06/29/2018, 8:34 AM

## 2018-06-30 MED FILL — METHOCARBAMOL 500 MG TABS: 500 | 10 days supply | Qty: 40 | Fill #0

## 2018-07-01 ENCOUNTER — Encounter: Payer: Self-pay | Admitting: *Deleted

## 2018-07-01 ENCOUNTER — Other Ambulatory Visit: Payer: Self-pay | Admitting: *Deleted

## 2018-07-01 NOTE — Patient Outreach (Addendum)
[facility]) Care Management  07/01/2018  [name]. 12/21/1960 [id number]   Subjective: Telephone call to patient's home number, spoke with patient, and HIPAA verified.  Discussed [facility] Care Management [facility]MR Transition of care follow up, patient voiced understanding, and is in agreement to follow up.  Patient remembers speaking with this RNCM in the past, states the surgery went well, has a spinal block versus general anesthesia, has more pain with this knee (left), then the last knee surgery, has been in contact with surgeon's office since hospital discharge,  is following all their recommendations (applying ice as needed) to decrease pain, and decrease swelling.   States mobility is an issue because both knees are weak due to surgery, has discussed with surgeon's office,  office advised this is to be expected, patient voices understanding, and he is aware of signs / symptoms to report to MD.   States he will start outpatient physical therapy on 07/04/18 and has a follow up appointment with surgeon on 07/12/18.   States has a decreased appetite due to pain, will try to increase appetite as needed, and is continuing to stay hydrated.  Patient states he is able to manage self care and has assistance as needed.  Patient voices understanding of medical diagnosis, surgery, and treatment plan. [facility] benefits discussed on 05/26/18 preoperative call and patient states no additional questions at this time. Patient states he does not have any education material, transition of care, care coordination, disease management, disease monitoring, transportation, community resource, or pharmacy needs at this time.  States he is very appreciative of the follow up and is in agreement to receive [place] Management information.       Objective:Per KPN (Knowledge Performance Now, point of care tool) and chart review, patient hospitalized 06/28/18  - 06/29/18 for  Leftknee osteoarthritis, status  postLeft TOTAL KNEE [place] 06/28/18.   Patienthospitalized10/1/9 - 10/2/19forRightknee osteoarthritis, status postRIGHT [facility] 05/31/[facility]. Patient also has a history of Arthritis,Diverticulosis, diabetes, hypertension,Dyslipidemia,Longstanding persistent atrial fibrillation,Metabolic syndrome,Psoriasis,Seborrheic dermatitis,Sleep apnea(CPAP) , andTIA (transient ischemic attack). Patent transitioned from [facility] program for diabetes management to [facility] on 08/04/17.       Assessment: Received [facility] Preoperative / Transition of care referral on 05/20/18. Preoperative call completed, Transition of care follow up completed  from 1st knee  (right) surgery.  Transition of care follow up completed for 2nd knee (left) surgery, no care management needs, and will proceed with case closure.        Plan:RNCM will send patient successful outreach letter, [facility] pamphlet, and magnet. RNCM will complete case closure due to follow up completed / no care management needs.          Teyana Pierron H. Annia Friendly, BSN, [place] Management [facility] Telephonic CM Phone: [phone] Fax: [phone]

## 2018-07-04 ENCOUNTER — Encounter (HOSPITAL_COMMUNITY): Payer: Self-pay | Admitting: Orthopedic Surgery

## 2018-07-04 MED FILL — oxyCODONE HCL 5 MG TABS: 5 | 5 days supply | Qty: 60 | Fill #0

## 2018-07-05 NOTE — Discharge Summary (Signed)
Physician Discharge Summary  Patient ID: [name]. MRN: [id number] DOB/AGE: 1960/11/22 57 y.o.  Admit date: 06/28/2018 Discharge date: 06/29/2018   Procedures:  Procedure(s) (LRB): LEFT TOTAL KNEE ARTHROPLASTY (Left)  Attending Physician:  Dr. [name]   Admission Diagnoses:   Left knee primary OA / pain  Discharge Diagnoses:  Principal Problem:   S/P left TKA Active Problems:   Obesity (BMI 30-39.9)  Past Medical History:  Diagnosis Date  . Arthritis   . Diverticulosis   . Dyslipidemia   . GERD (gastroesophageal reflux disease)   . Herpes labialis   . Hypertension   . Longstanding persistent atrial fibrillation   . Metabolic syndrome   . Obesity   . Proteinuria   . Psoriasis   . Seborrheic dermatitis   . Sleep apnea    very compliant with CPAP  . TIA (transient ischemic attack) 2009    HPI:    [name]., 57 y.o. male, has a history of pain and functional disability in the left knee due to arthritis and has failed non-surgical conservative treatments for greater than 12 weeks to include NSAID's and/or analgesics and activity modification.  Onset of symptoms was gradual, starting >10 years ago with gradually worsening course since that time. The patient noted prior procedures on the knee to include  arthroplasty on the right knee(s).  Patient currently rates pain in the left knee(s) at 7 out of 10 with activity. Patient has worsening of pain with activity and weight bearing, pain that interferes with activities of daily living, pain with passive range of motion, crepitus and joint swelling.  Patient has evidence of periarticular osteophytes and joint space narrowing by imaging studies.  There is no active infection.  Risks, benefits and expectations were discussed with the patient.  Risks including but not limited to the risk of anesthesia, blood clots, nerve damage, blood vessel damage, failure of the prosthesis, infection and up to and including  death.  Patient understand the risks, benefits and expectations and wishes to proceed with surgery.  PCP: Denita Lung, MD   Discharged Condition: good  Hospital Course:  Patient underwent the above stated procedure on 06/28/2018. Patient tolerated the procedure well and brought to the recovery room in good condition and subsequently to the floor.  POD #1 BP: 158/98 ; Pulse: 67 ; Temp: 97.6 F (36.4 C) ; Resp: 18 Patient reports pain as mild, pain controlled. No events throughout the night. Working well with PT.  Ready to be discharged home.  Dorsiflexion/plantar flexion intact, incision: dressing C/D/I, no cellulitis present and compartment soft.   LABS  Basename    HGB     13.0  HCT     38.1    Discharge Exam: General appearance: alert, cooperative and no distress Extremities: Homans sign is negative, no sign of DVT, no edema, redness or tenderness in the calves or thighs and no ulcers, gangrene or trophic changes  Disposition:  Home with follow up in 2 weeks   Follow-up Information    [name], MD. Schedule an appointment as soon as possible for a visit in 2 weeks.   Specialty:  Orthopedic Surgery Contact information: [address] [place] [address] [phone]           Discharge Instructions    Call MD / Call 911   Complete by:  As directed    If you experience chest pain or shortness of breath, CALL 911 and be transported  to the hospital emergency room.  If you develope a fever above 101 F, pus (white drainage) or increased drainage or redness at the wound, or calf pain, call your surgeon's office.   Change dressing   Complete by:  As directed    Maintain surgical dressing until follow up in the clinic. If the edges start to pull up, may reinforce with tape. If the dressing is no longer working, may remove and cover with gauze and tape, but must keep the area dry and clean.  Call with any questions or concerns.   Constipation Prevention    Complete by:  As directed    Drink plenty of fluids.  Prune juice may be helpful.  You may use a stool softener, such as Colace (over the counter) 100 mg twice a day.  Use MiraLax (over the counter) for constipation as needed.   Diet - low sodium heart healthy   Complete by:  As directed    Discharge instructions   Complete by:  As directed    Maintain surgical dressing until follow up in the clinic. If the edges start to pull up, may reinforce with tape. If the dressing is no longer working, may remove and cover with gauze and tape, but must keep the area dry and clean.  Follow up in 2 weeks at [facility]. Call with any questions or concerns.   Increase activity slowly as tolerated   Complete by:  As directed    Weight bearing as tolerated with assist device (walker, cane, etc) as directed, use it as long as suggested by your surgeon or therapist, typically at least 4-6 weeks.   TED hose   Complete by:  As directed    Use stockings (TED hose) for 2 weeks on both leg(s).  You may remove them at night for sleeping.      Allergies as of 06/29/2018      Reactions   Morphine And Related Other (See Comments)   Hallucinations      Medication List    STOP taking these medications   ACCU-CHEK FASTCLIX LANCETS Misc   ACCU-CHEK GUIDE test strip Generic drug:  glucose blood   enoxaparin 120 MG/0.8ML injection Commonly known as:  LOVENOX   gabapentin 300 MG capsule Commonly known as:  NEURONTIN   HYDROcodone-acetaminophen 7.5-325 MG tablet Commonly known as:  NORCO   ULTICARE ALCOHOL SWABS 70 % Pads     TAKE these medications   acetaminophen 500 MG tablet Commonly known as:  TYLENOL Take 2 tablets (1,000 mg total) by mouth every 8 (eight) hours.   benazepril 40 MG tablet Commonly known as:  LOTENSIN Take 1 tablet (40 mg total) by mouth daily.   carvedilol 12.5 MG tablet Commonly known as:  COREG Take 1 tablet (12.5 mg total) by mouth 2 (two) times daily.     clobetasol 0.05 % external solution Commonly known as:  TEMOVATE Apply 1 application topically 2 (two) times daily as needed (for psoriasis).   docusate sodium 100 MG capsule Commonly known as:  COLACE Take 1 capsule (100 mg total) by mouth 2 (two) times daily.   Dulaglutide 0.75 MG/0.5ML Sopn Inject 1 applicator into the skin once a week.   ferrous sulfate 325 (65 FE) MG tablet Take 1 tablet (325 mg total) by mouth 3 (three) times daily with meals.   Ixekizumab 80 MG/ML Soaj Inject 80 mg into the skin every 30 (thirty) days.   metFORMIN 500 MG 24 hr tablet Commonly known  as:  GLUCOPHAGE-XR TAKE ONE TABLET BY MOUTH TWICE DAILY   methocarbamol 500 MG tablet Commonly known as:  ROBAXIN Take 1 tablet (500 mg total) by mouth every 6 (six) hours as needed for muscle spasms.   oxyCODONE 5 MG immediate release tablet Commonly known as:  Oxy IR/ROXICODONE Take 1-2 tablets (5-10 mg total) by mouth every 4 (four) hours as needed for moderate pain or severe pain.   oxyCODONE 5 MG immediate release tablet Commonly known as:  Oxy IR/ROXICODONE Take 1-2 tablets (5-10 mg total) by mouth every 4 (four) hours as needed for moderate pain or severe pain.   oxymetazoline 0.05 % nasal spray Commonly known as:  AFRIN Place 1 spray into both nostrils at bedtime.   pantoprazole 40 MG tablet Commonly known as:  PROTONIX Take 1 tablet (40 mg total) by mouth every other day.   polyethylene glycol packet Commonly known as:  MIRALAX / GLYCOLAX Take 17 g by mouth 2 (two) times daily.   rosuvastatin 10 MG tablet Commonly known as:  CRESTOR Take 1 tablet (10 mg total) by mouth daily.   Vitamin D (Cholecalciferol) 1000 units Caps Take 1,000 Units by mouth daily.   XARELTO 20 MG Tabs tablet Generic drug:  rivaroxaban TAKE 1 TABLET BY MOUTH DAILY WITH SUPPER. What changed:  See the new instructions.            Discharge Care Instructions  (From admission, onward)         Start      Ordered   06/29/18 0000  Change dressing    Comments:  Maintain surgical dressing until follow up in the clinic. If the edges start to pull up, may reinforce with tape. If the dressing is no longer working, may remove and cover with gauze and tape, but must keep the area dry and clean.  Call with any questions or concerns.   06/29/18 8101           Signed: West Pugh. Ebonye Reade   PA-C  07/05/2018, 9:37 AM

## 2018-07-06 DIAGNOSIS — M25662 Stiffness of left knee, not elsewhere classified: Secondary | ICD-10-CM | POA: Diagnosis not present

## 2018-07-08 DIAGNOSIS — M25662 Stiffness of left knee, not elsewhere classified: Secondary | ICD-10-CM | POA: Diagnosis not present

## 2018-07-08 DIAGNOSIS — M25661 Stiffness of right knee, not elsewhere classified: Secondary | ICD-10-CM | POA: Diagnosis not present

## 2018-07-08 MED FILL — oxyCODONE HCL 5 MG TABS: 5 | 8 days supply | Qty: 60 | Fill #0

## 2018-07-11 ENCOUNTER — Encounter: Payer: Self-pay | Admitting: Internal Medicine

## 2018-07-11 ENCOUNTER — Ambulatory Visit: Payer: 59 | Admitting: Internal Medicine

## 2018-07-11 VITALS — BP 124/76 | HR 103 | Ht 70.0 in | Wt 256.4 lb

## 2018-07-11 DIAGNOSIS — I4819 Other persistent atrial fibrillation: Secondary | ICD-10-CM

## 2018-07-11 DIAGNOSIS — G4733 Obstructive sleep apnea (adult) (pediatric): Secondary | ICD-10-CM

## 2018-07-11 DIAGNOSIS — Z23 Encounter for immunization: Secondary | ICD-10-CM

## 2018-07-11 DIAGNOSIS — I10 Essential (primary) hypertension: Secondary | ICD-10-CM | POA: Diagnosis not present

## 2018-07-11 DIAGNOSIS — I428 Other cardiomyopathies: Secondary | ICD-10-CM

## 2018-07-11 DIAGNOSIS — M25661 Stiffness of right knee, not elsewhere classified: Secondary | ICD-10-CM | POA: Diagnosis not present

## 2018-07-11 DIAGNOSIS — M25662 Stiffness of left knee, not elsewhere classified: Secondary | ICD-10-CM | POA: Diagnosis not present

## 2018-07-11 MED ORDER — CARVEDILOL 25 MG PO TABS: 25.0000 mg | ORAL_TABLET | Freq: Two times a day (BID) | ORAL | 3 refills | Status: DC

## 2018-07-11 MED FILL — HYDROCODON-APAP 7.5-325: 7.5-325 | 5 days supply | Qty: 60 | Fill #0

## 2018-07-11 MED FILL — CARVEDILOL 25 MG TABLET: 25 | 90 days supply | Qty: 180 | Fill #0

## 2018-07-11 MED FILL — TALTZ 80 MG/ML AUTOINJECTOR: 80 | 28 days supply | Qty: 1 | Fill #5

## 2018-07-11 MED FILL — METHOCARBAMOL 500 MG TABS: 500 | 10 days supply | Qty: 40 | Fill #0

## 2018-07-11 NOTE — Progress Notes (Signed)
PCP: Denita Lung, MD Primary EP: Dr Denzil Hughes. is a 57 y.o. male who presents today for routine electrophysiology followup.  Since last being seen in our clinic, the patient reports doing reasonably well.  He is recently s/p bilateral knee surgeries.  He has lost about 40 lbs.  He has afib for which he is asymptomatic.  EF has reduced. Today, he denies symptoms of palpitations, chest pain, shortness of breath,  lower extremity edema, dizziness, presyncope, or syncope.  The patient is otherwise without complaint today.   Past Medical History:  Diagnosis Date  . Arthritis   . Diverticulosis   . Dyslipidemia   . GERD (gastroesophageal reflux disease)   . Herpes labialis   . Hypertension   . Longstanding persistent atrial fibrillation   . Metabolic syndrome   . Obesity   . Proteinuria   . Psoriasis   . Seborrheic dermatitis   . Sleep apnea    very compliant with CPAP  . TIA (transient ischemic attack) 2009   Past Surgical History:  Procedure Laterality Date  . CARDIOVERSION N/A 12/02/2016   Procedure: CARDIOVERSION;  Surgeon: Pixie Casino, MD;  [facility] ENDOSCOPY;  Service: Cardiovascular;  Laterality: N/A;  . COLONOSCOPY    . JOINT REPLACEMENT  11/03/10   LT HIP  . PFO occluder cardiac valve  2006   Dr. Einar Gip, (hole in heart)  . TONSILLECTOMY AND ADENOIDECTOMY    . TOTAL HIP ARTHROPLASTY Left   . TOTAL KNEE ARTHROPLASTY Right 05/31/2018   Procedure: RIGHT TOTAL KNEE ARTHROPLASTY;  Surgeon: [name], MD;  Location: [facility] ORS;  Service: Orthopedics;  Laterality: Right;  70 mins  . TOTAL KNEE ARTHROPLASTY Left 06/28/2018   Procedure: LEFT TOTAL KNEE ARTHROPLASTY;  Surgeon: [name], MD;  Location: [facility] ORS;  Service: Orthopedics;  Laterality: Left;  70 mins  . WISDOM TOOTH EXTRACTION      ROS- all systems are reviewed and negatives except as per HPI above  Current Outpatient Medications  Medication Sig Dispense Refill  . acetaminophen (TYLENOL)  500 MG tablet Take 2 tablets (1,000 mg total) by mouth every 8 (eight) hours. 30 tablet 0  . benazepril (LOTENSIN) 40 MG tablet Take 1 tablet (40 mg total) by mouth daily. 90 tablet 3  . carvedilol (COREG) 12.5 MG tablet Take 1 tablet (12.5 mg total) by mouth 2 (two) times daily. 90 tablet 3  . clobetasol (TEMOVATE) 0.05 % external solution Apply 1 application topically 2 (two) times daily as needed (for psoriasis).   0  . docusate sodium (COLACE) 100 MG capsule Take 1 capsule (100 mg total) by mouth 2 (two) times daily. 10 capsule 0  . Dulaglutide (TRULICITY) 3.79 KW/4.0XB SOPN Inject 1 applicator into the skin once a week. 4 pen 5  . ferrous sulfate (FERROUSUL) 325 (65 FE) MG tablet Take 1 tablet (325 mg total) by mouth 3 (three) times daily with meals.  3  . Ixekizumab (TALTZ) 80 MG/ML SOAJ Inject 80 mg into the skin every 30 (thirty) days. 1 mL 5  . metFORMIN (GLUCOPHAGE-XR) 500 MG 24 hr tablet Take 500 mg by mouth 2 (two) times daily.    . methocarbamol (ROBAXIN) 500 MG tablet Take 1 tablet (500 mg total) by mouth every 6 (six) hours as needed for muscle spasms. 40 tablet 0  . oxyCODONE (OXY IR/ROXICODONE) 5 MG immediate release tablet Take 1-2 tablets (5-10 mg total) by mouth every 4 (four) hours as needed for moderate  pain or severe pain. 60 tablet 0  . oxymetazoline (AFRIN) 0.05 % nasal spray Place 1 spray into both nostrils at bedtime.    . pantoprazole (PROTONIX) 40 MG tablet Take 1 tablet (40 mg total) by mouth every other day. 90 tablet 3  . polyethylene glycol (MIRALAX / GLYCOLAX) packet Take 17 g by mouth 2 (two) times daily. 14 each 0  . rivaroxaban (XARELTO) 20 MG TABS tablet Take 20 mg by mouth daily.    . rosuvastatin (CRESTOR) 10 MG tablet Take 1 tablet (10 mg total) by mouth daily. 90 tablet 3  . Vitamin D, Cholecalciferol, 1000 units CAPS Take 1,000 Units by mouth daily.      No current facility-administered medications for this visit.     Physical Exam: Vitals:   07/11/18  1450  BP: 124/76  Pulse: (!) 103  SpO2: 98%  Weight: 256 lb 6.4 oz (116.3 kg)  Height: 5\' 10"  (1.778 m)    GEN- The patient is well appearing, alert and oriented x 3 today.   Head- normocephalic, atraumatic Eyes-  Sclera clear, conjunctiva pink Ears- hearing intact Oropharynx- clear Lungs- Clear to ausculation bilaterally, normal work of breathing Heart- irregular rate and rhythm, no murmurs, rubs or gallops, PMI not laterally displaced GI- soft, NT, ND, + BS Extremities- no clubbing, cyanosis, or edema  Wt Readings from Last 3 Encounters:  07/11/18 256 lb 6.4 oz (116.3 kg)  06/28/18 265 lb (120.2 kg)  06/24/18 265 lb (120.2 kg)    EKG tracing ordered today is personally reviewed and shows afib, V rate 103 bpm  Assessment and Plan:  1. Longstanding persistent afib Rates typically are controlled and he has been asymptomatic.  He has moderate biatrial enlargement.  Our ability to maintain sinus rhythm long term is low. chads2vasc score is 5.  He is on xarelto  2. Nonischemic CM euvolemic EF 35-40% by echo 05/09/18 (reviewed) Started on coreg last visit Increase coreg to 25mg  BID today Would consider entresto as the next step I will refer to Dr Acie Fredrickson as he has not recently seen general cardiology for CHF management.  3. Obesity Body mass index is 36.79 kg/m. He has made significant improvement with lifestyle change.  4. OSA Compliance with CPAP encouraged  5. HTN Stable No change required today  Refer to Dr Acie Fredrickson Return to see me in 6 months  Thompson Grayer MD, [facility] 07/11/2018 3:20 PM

## 2018-07-11 NOTE — Patient Instructions (Addendum)
Medication Instructions:  Your physician has recommended you make the following change in your medication:   1.  Increase your carvedilol---Take 25 mg (one tablet) by mouth twice a day.   Labwork: None ordered.  Testing/Procedures: None ordered.  Follow-Up: Your physician wants you to follow-up in: 6 months with Dr. Rayann Heman.      Any Other Special Instructions Will Be Listed Below (If Applicable).  If you need a refill on your cardiac medications before your next appointment, please call your pharmacy.    You are being referred to Dr. Acie Fredrickson with general cardiology for further management of heart failure

## 2018-07-13 DIAGNOSIS — Z760 Encounter for issue of repeat prescription: Secondary | ICD-10-CM | POA: Diagnosis not present

## 2018-07-13 DIAGNOSIS — M25661 Stiffness of right knee, not elsewhere classified: Secondary | ICD-10-CM | POA: Diagnosis not present

## 2018-07-13 DIAGNOSIS — M25662 Stiffness of left knee, not elsewhere classified: Secondary | ICD-10-CM | POA: Diagnosis not present

## 2018-07-15 DIAGNOSIS — M25662 Stiffness of left knee, not elsewhere classified: Secondary | ICD-10-CM | POA: Diagnosis not present

## 2018-07-15 DIAGNOSIS — M25661 Stiffness of right knee, not elsewhere classified: Secondary | ICD-10-CM | POA: Diagnosis not present

## 2018-07-18 DIAGNOSIS — M25662 Stiffness of left knee, not elsewhere classified: Secondary | ICD-10-CM | POA: Diagnosis not present

## 2018-07-18 DIAGNOSIS — M25661 Stiffness of right knee, not elsewhere classified: Secondary | ICD-10-CM | POA: Diagnosis not present

## 2018-07-19 MED FILL — HYDROCODON-APAP 7.5-325: 7.5-325 | 7 days supply | Qty: 60 | Fill #0

## 2018-07-20 DIAGNOSIS — M25662 Stiffness of left knee, not elsewhere classified: Secondary | ICD-10-CM | POA: Diagnosis not present

## 2018-07-20 DIAGNOSIS — M25669 Stiffness of unspecified knee, not elsewhere classified: Secondary | ICD-10-CM | POA: Diagnosis not present

## 2018-07-22 DIAGNOSIS — M25662 Stiffness of left knee, not elsewhere classified: Secondary | ICD-10-CM | POA: Diagnosis not present

## 2018-07-22 DIAGNOSIS — M25669 Stiffness of unspecified knee, not elsewhere classified: Secondary | ICD-10-CM | POA: Diagnosis not present

## 2018-07-25 DIAGNOSIS — M25662 Stiffness of left knee, not elsewhere classified: Secondary | ICD-10-CM | POA: Diagnosis not present

## 2018-07-25 DIAGNOSIS — M25661 Stiffness of right knee, not elsewhere classified: Secondary | ICD-10-CM | POA: Diagnosis not present

## 2018-07-25 MED FILL — METHOCARBAMOL 500 MG TABS: 500 | 10 days supply | Qty: 30 | Fill #0

## 2018-07-25 MED FILL — HYDROCODON-APAP 7.5-325: 7.5-325 | 8 days supply | Qty: 60 | Fill #0

## 2018-07-25 MED FILL — CEPHALEXIN 500 MG CAPSULE: 500 | 5 days supply | Qty: 15 | Fill #0

## 2018-08-01 DIAGNOSIS — M25662 Stiffness of left knee, not elsewhere classified: Secondary | ICD-10-CM | POA: Diagnosis not present

## 2018-08-01 DIAGNOSIS — M25661 Stiffness of right knee, not elsewhere classified: Secondary | ICD-10-CM | POA: Diagnosis not present

## 2018-08-03 DIAGNOSIS — M1712 Unilateral primary osteoarthritis, left knee: Secondary | ICD-10-CM | POA: Diagnosis not present

## 2018-08-03 MED FILL — CEPHALEXIN 500 MG CAPSULE: 500 | 10 days supply | Qty: 30 | Fill #0

## 2018-08-03 MED FILL — METHOCARBAMOL 500 MG TABS: 500 | 10 days supply | Qty: 30 | Fill #0

## 2018-08-04 ENCOUNTER — Other Ambulatory Visit: Payer: Self-pay | Admitting: Pharmacist

## 2018-08-04 MED ORDER — IXEKIZUMAB 80 MG/ML ~~LOC~~ SOAJ: 80.0000 mg | SUBCUTANEOUS | 11 refills | Status: DC

## 2018-08-04 MED FILL — ULTICARE ALCOHOL SWABS PADS: 50 days supply | Qty: 100 | Fill #1

## 2018-08-04 MED FILL — HYDROCODON-APAP 7.5-325: 7.5-325 | 8 days supply | Qty: 60 | Fill #0

## 2018-08-04 MED FILL — CLOBETASOL PROPIONATE 0.05: 0.05 | 30 days supply | Qty: 60 | Fill #0

## 2018-08-04 MED FILL — BENAZEPRIL HCL 40 MG TABLET: 40 | 90 days supply | Qty: 90 | Fill #3

## 2018-08-04 MED FILL — metFORMIN HCL ER 500 MG TB2: 500 | 90 days supply | Qty: 180 | Fill #1

## 2018-08-04 MED FILL — TALTZ 80 MG/ML AUTOINJECTOR: 80 | 30 days supply | Qty: 1 | Fill #0

## 2018-08-04 MED FILL — XARELTO 20 MG TABLET: 20 | 90 days supply | Qty: 90 | Fill #1

## 2018-08-05 DIAGNOSIS — M25662 Stiffness of left knee, not elsewhere classified: Secondary | ICD-10-CM | POA: Diagnosis not present

## 2018-08-05 DIAGNOSIS — M25661 Stiffness of right knee, not elsewhere classified: Secondary | ICD-10-CM | POA: Diagnosis not present

## 2018-08-08 DIAGNOSIS — M25662 Stiffness of left knee, not elsewhere classified: Secondary | ICD-10-CM | POA: Diagnosis not present

## 2018-08-11 DIAGNOSIS — M25661 Stiffness of right knee, not elsewhere classified: Secondary | ICD-10-CM | POA: Diagnosis not present

## 2018-08-11 DIAGNOSIS — M25662 Stiffness of left knee, not elsewhere classified: Secondary | ICD-10-CM | POA: Diagnosis not present

## 2018-08-11 MED FILL — CELECOXIB 200 MG CAP: 200 | 30 days supply | Qty: 30 | Fill #0

## 2018-08-11 MED FILL — METHOCARBAMOL 500 MG TABS: 500 | 10 days supply | Qty: 30 | Fill #0

## 2018-08-11 MED FILL — HYDROCODON-APAP 5-325: 5-325 | 8 days supply | Qty: 50 | Fill #0

## 2018-08-12 MED FILL — PANTOPRAZOLE SOD DR 40 MG T: 40 | 90 days supply | Qty: 45 | Fill #3

## 2018-08-16 DIAGNOSIS — M25669 Stiffness of unspecified knee, not elsewhere classified: Secondary | ICD-10-CM | POA: Diagnosis not present

## 2018-08-16 DIAGNOSIS — M25662 Stiffness of left knee, not elsewhere classified: Secondary | ICD-10-CM | POA: Diagnosis not present

## 2018-08-16 MED FILL — ACCU-CHEK GUIDE STRP: 50 days supply | Qty: 100 | Fill #3

## 2018-08-18 DIAGNOSIS — M25661 Stiffness of right knee, not elsewhere classified: Secondary | ICD-10-CM | POA: Diagnosis not present

## 2018-08-18 DIAGNOSIS — M25662 Stiffness of left knee, not elsewhere classified: Secondary | ICD-10-CM | POA: Diagnosis not present

## 2018-08-19 ENCOUNTER — Telehealth: Payer: Self-pay | Admitting: Cardiovascular Disease

## 2018-08-19 NOTE — Telephone Encounter (Signed)
Fine with me

## 2018-08-19 NOTE — Telephone Encounter (Signed)
That is fine with me.

## 2018-08-19 NOTE — Telephone Encounter (Signed)
New message    Patient is wanting to switch from Dr Gwenlyn Found to Dr Acie Fredrickson , per patient request. Please advise

## 2018-08-23 DIAGNOSIS — M25662 Stiffness of left knee, not elsewhere classified: Secondary | ICD-10-CM | POA: Diagnosis not present

## 2018-08-23 DIAGNOSIS — M25669 Stiffness of unspecified knee, not elsewhere classified: Secondary | ICD-10-CM | POA: Diagnosis not present

## 2018-08-23 MED FILL — METHOCARBAMOL 500 MG TABS: 500 | 10 days supply | Qty: 30 | Fill #0

## 2018-08-23 MED FILL — HYDROCODON-APAP 5-325: 5-325 | 10 days supply | Qty: 30 | Fill #0

## 2018-08-26 DIAGNOSIS — G4733 Obstructive sleep apnea (adult) (pediatric): Secondary | ICD-10-CM | POA: Diagnosis not present

## 2018-08-29 DIAGNOSIS — M25669 Stiffness of unspecified knee, not elsewhere classified: Secondary | ICD-10-CM | POA: Diagnosis not present

## 2018-08-29 DIAGNOSIS — M25662 Stiffness of left knee, not elsewhere classified: Secondary | ICD-10-CM | POA: Diagnosis not present

## 2018-09-01 DIAGNOSIS — M25662 Stiffness of left knee, not elsewhere classified: Secondary | ICD-10-CM | POA: Diagnosis not present

## 2018-09-01 DIAGNOSIS — M25669 Stiffness of unspecified knee, not elsewhere classified: Secondary | ICD-10-CM | POA: Diagnosis not present

## 2018-09-05 ENCOUNTER — Other Ambulatory Visit: Payer: 59

## 2018-09-05 ENCOUNTER — Encounter: Payer: Self-pay | Admitting: Cardiovascular Disease

## 2018-09-05 ENCOUNTER — Other Ambulatory Visit: Payer: Self-pay | Admitting: Family Medicine

## 2018-09-05 ENCOUNTER — Ambulatory Visit: Payer: 59 | Admitting: Cardiovascular Disease

## 2018-09-05 VITALS — BP 120/80 | HR 55 | Ht 70.0 in | Wt 257.0 lb

## 2018-09-05 DIAGNOSIS — M25561 Pain in right knee: Secondary | ICD-10-CM

## 2018-09-05 DIAGNOSIS — I5022 Chronic systolic (congestive) heart failure: Secondary | ICD-10-CM

## 2018-09-05 DIAGNOSIS — Z96651 Presence of right artificial knee joint: Secondary | ICD-10-CM | POA: Diagnosis not present

## 2018-09-05 DIAGNOSIS — Z471 Aftercare following joint replacement surgery: Secondary | ICD-10-CM | POA: Diagnosis not present

## 2018-09-05 DIAGNOSIS — Z96652 Presence of left artificial knee joint: Secondary | ICD-10-CM | POA: Diagnosis not present

## 2018-09-05 MED ORDER — SACUBITRIL-VALSARTAN 24-26 MG PO TABS: 1.0000 | ORAL_TABLET | Freq: Two times a day (BID) | ORAL | 11 refills | Status: DC

## 2018-09-05 MED ORDER — CARVEDILOL 12.5 MG PO TABS: 12.5000 mg | ORAL_TABLET | Freq: Two times a day (BID) | ORAL | 3 refills | Status: DC

## 2018-09-05 MED FILL — ENTRESTO 24 MG-26 MG TABLET: 24-26 | 30 days supply | Qty: 60 | Fill #0

## 2018-09-05 MED FILL — CEPHALEXIN 500 MG CAPSULE: 500 | 7 days supply | Qty: 28 | Fill #0

## 2018-09-05 NOTE — Progress Notes (Signed)
He came in with orders to have blood drawn for his orthopedic surgeon

## 2018-09-05 NOTE — Patient Instructions (Addendum)
Medication Instructions:  Your physician has recommended you make the following change in your medication:  DECREASE Coreg (Carvedilol) to 12.5 mg twice daily STOP Benazepril START Entresto (Sacubitril/Valsartan) 24-26 mg twice daily - START on Tuesday Jan. 7 in the evening  If you need a refill on your cardiac medications before your next appointment, please call your pharmacy.   Lab work: Your physician recommends that you return for lab work on [employer]. Jan. 23 for basic metabolic panel  If you have labs (blood work) drawn today and your tests are completely normal, you will receive your results only by: Marland Kitchen MyChart Message (if you have MyChart) OR . A paper copy in the mail If you have any lab test that is abnormal or we need to change your treatment, we will call you to review the results.  Testing/Procedures: None Ordered   Follow-Up: Your physician recommends that you return for a follow-up appointment on Thursday January 23 at 9:00 am for BP check with Nurse Los Alvarez physician recommends that you return for a follow-up appointment on February 24 with Dr. Acie Fredrickson at 8:20 am

## 2018-09-05 NOTE — Progress Notes (Signed)
Cardiology Office Note:    Date:  09/05/2018   ID:  William Barges., DOB February 21, 1961, MRN [id number]  PCP:  Denita Lung, MD  Cardiologist:  Acie Fredrickson   / [place]  Electrophysiologist:  None   Referring MD: Denita Lung, MD   Chief Complaint  Patient presents with  . Atrial Fibrillation     Jan.  6, 2020    [name]. is a 58 y.o. male with a hx of persistent atrial fibrillation, dyslipidemia, obstructive sleep apnea He has been seen in the past by Dr. [name].  He is seen currently by Dr. Thompson Grayer and was furred to me for general cardiology issues.  Hx of HTN, PFO - s/p occlusion device.   Hx of CHF  Hx of OSA    Seen with his wife , Abner Greenspan  Has seen Dr. Einar Gip in the past  Has a PFO occlusion device following a TIA   Has had bilateral knee replacements ,  Both in Oct. 2019.   Dr. Rayann Heman increased his Coreg to 25  BID ,  Has some fatigue related to this increase  Tolerated the lower dose better .   Is on crutches today ,  Typically gets 7000-8000 steps a day   No CP, no dyspnea,  No sweats, no  PND.  No fever, rash, Sleeps well with CPAP.    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 Arthritis   . Diverticulosis   . Dyslipidemia   . GERD (gastroesophageal reflux disease)   . Herpes labialis   . Hypertension   . Longstanding persistent atrial fibrillation   . Metabolic syndrome   . Obesity   . Proteinuria   . Psoriasis   . Seborrheic dermatitis   . Sleep apnea    very compliant with CPAP  . TIA (transient ischemic attack) 2009    Past Surgical History:  Procedure Laterality Date  . CARDIOVERSION N/A 12/02/2016   Procedure: CARDIOVERSION;  Surgeon: Pixie Casino, MD;  Location: [facility] ENDOSCOPY;  Service: Cardiovascular;  Laterality: N/A;  . COLONOSCOPY    . JOINT REPLACEMENT  11/03/10   LT HIP  . PFO occluder cardiac valve  2006   Dr.  Einar Gip, (hole in heart)  . TONSILLECTOMY AND ADENOIDECTOMY    . TOTAL HIP ARTHROPLASTY Left   . TOTAL KNEE ARTHROPLASTY Right 05/31/2018   Procedure: RIGHT TOTAL KNEE ARTHROPLASTY;  Surgeon: [name], MD;  Location: [facility] ORS;  Service: Orthopedics;  Laterality: Right;  70 mins  . TOTAL KNEE ARTHROPLASTY Left 06/28/2018   Procedure: LEFT TOTAL KNEE ARTHROPLASTY;  Surgeon: [name], MD;  Location: [facility] ORS;  Service: Orthopedics;  Laterality: Left;  70 mins  . WISDOM TOOTH EXTRACTION      Current Medications: Current Meds  Medication Sig  . acetaminophen (TYLENOL) 500 MG tablet Take 2 tablets (1,000 mg total) by mouth every 8 (eight) hours.  . cephALEXin (KEFLEX) 500 MG capsule Take 1 capsule by mouth 4 (four) times daily.  . clobetasol (TEMOVATE) 0.05 % external solution Apply 1 application topically 2 (two) times daily as needed (for psoriasis).   Marland Kitchen docusate sodium (COLACE) 100 MG capsule Take 1 capsule (100 mg total) by mouth 2 (two) times daily.  . Dulaglutide (TRULICITY) 7.90 WI/0.9BD SOPN Inject 1 applicator into the skin once a week.  Marland Kitchen  ferrous sulfate (FERROUSUL) 325 (65 FE) MG tablet Take 1 tablet (325 mg total) by mouth 3 (three) times daily with meals.  . Ixekizumab (TALTZ) 80 MG/ML SOAJ Inject 80 mg into the skin every 30 (thirty) days.  . metFORMIN (GLUCOPHAGE-XR) 500 MG 24 hr tablet Take 500 mg by mouth 2 (two) times daily.  . methocarbamol (ROBAXIN) 500 MG tablet Take 500 mg by mouth 4 (four) times daily.  Marland Kitchen oxyCODONE (OXY IR/ROXICODONE) 5 MG immediate release tablet Take 1-2 tablets (5-10 mg total) by mouth every 4 (four) hours as needed for moderate pain or severe pain.  Marland Kitchen oxymetazoline (AFRIN) 0.05 % nasal spray Place 1 spray into both nostrils at bedtime.  . pantoprazole (PROTONIX) 40 MG tablet Take 1 tablet (40 mg total) by mouth every other day.  . rivaroxaban (XARELTO) 20 MG TABS tablet Take 20 mg by mouth daily.  . rosuvastatin (CRESTOR) 10 MG tablet Take 1 tablet  (10 mg total) by mouth daily.  . Vitamin D, Cholecalciferol, 1000 units CAPS Take 1,000 Units by mouth daily.   . [DISCONTINUED] benazepril (LOTENSIN) 40 MG tablet Take 1 tablet (40 mg total) by mouth daily.  . [DISCONTINUED] carvedilol (COREG) 25 MG tablet Take 1 tablet (25 mg total) by mouth 2 (two) times daily.     Allergies:   Morphine and related   Social History   Socioeconomic History  . Marital status: Married    Spouse name: Not on file  . Number of children: 2  . Years of education: Not on file  . Highest education level: Not on file  Occupational History  . Occupation: Theatre manager: [employer]  . Financial resource strain: Not on file  . Food insecurity:    Worry: Not on file    Inability: Not on file  . Transportation needs:    Medical: Not on file    Non-medical: Not on file  Tobacco Use  . Smoking status: Never Smoker  . Smokeless tobacco: Never Used  Substance and Sexual Activity  . Alcohol use: Yes    Comment: 2/month  . Drug use: No  . Sexual activity: Yes  Lifestyle  . Physical activity:    Days per week: Not on file    Minutes per session: Not on file  . Stress: Not on file  Relationships  . Social connections:    Talks on phone: Not on file    Gets together: Not on file    Attends religious service: Not on file    Active member of club or organization: Not on file    Attends meetings of clubs or organizations: Not on file    Relationship status: Not on file  Other Topics Concern  . Not on file  Social History Narrative   Lives with spouse in [place]   Wife works for [employer] teaching program   Owns Advertising copywriter     Family History: The patient's family history includes Cancer in an other family member; Crohn's disease in his mother; Esophageal cancer in his father; Obesity in an other family member; Uterine cancer (age of onset: 69) in his mother.  ROS:   Please see the history of present  illness.     All other systems reviewed and are negative.  EKGs/Labs/Other Studies Reviewed:    The following studies were reviewed today:   EKG:     Recent Labs: 10/18/2017: ALT 31 06/29/2018: BUN 18; Creatinine, Ser 1.13; Hemoglobin  13.0; Platelets 155; Potassium 4.3; Sodium 134  Recent Lipid Panel    Component Value Date/Time   CHOL 108 10/18/2017 1004   TRIG 225 (H) 10/18/2017 1004   HDL 36 (L) 10/18/2017 1004   CHOLHDL 3.0 10/18/2017 1004   CHOLHDL 4.1 10/13/2016 0956   VLDL 65 (H) 10/13/2016 0956   LDLCALC 27 10/18/2017 1004    Physical Exam:    VS:  BP 120/80   Pulse (!) 55   Ht 5\' 10"  (1.778 m)   Wt 257 lb (116.6 kg)   SpO2 98%   BMI 36.88 kg/m     Wt Readings from Last 3 Encounters:  09/05/18 257 lb (116.6 kg)  07/11/18 256 lb 6.4 oz (116.3 kg)  06/28/18 265 lb (120.2 kg)     GEN:  Well nourished, well developed in no acute distress HEENT: Normal NECK: No JVD; No carotid bruits LYMPHATICS: No lymphadenopathy CARDIAC: RRR, no murmurs, rubs, gallops RESPIRATORY:  Clear to auscultation without rales, wheezing or rhonchi  ABDOMEN: Soft, non-tender, non-distended MUSCULOSKELETAL:  No edema; No deformity  SKIN: Warm and dry NEUROLOGIC:  Alert and oriented x 3 PSYCHIATRIC:  Normal affect   ASSESSMENT:    1. Chronic systolic CHF (congestive heart failure) (HCC)    PLAN:    In order of problems listed above:  1. Chronic systolic congestive heart failure: [name] is seen today for follow-up visit for his chronic systolic congestive heart failure.  On carvedilol but is not tolerating the higher dose very well.  He is also on benazepril. We will decrease the carvedilol to 12.5 mg twice a day.  I would also like to stop the benazepril and start him on Entresto 24-26 mg twice a day.  He is to wait start Entresto tomorrow night since he requires a 36-hour delay between an ACE inhibitor and Entresto.  We will have him return for nurse visit/blood pressure  check/basic metabolic profile in 2 weeks.  I will see him again in 4 to 6 weeks. I anticipate titrating up his CHF medications which may include increasing the Entresto dose, the addition of Aldactone, possible addition of Jardiance.    Ive asked him to check with Dr. Redmond School about adding Vania Rea to his diabetic therapy .      Medication Adjustments/Labs and Tests Ordered: Current medicines are reviewed at length with the patient today.  Concerns regarding medicines are outlined above.  Orders Placed This Encounter  Procedures  . Basic Metabolic Panel (BMET)   Meds ordered this encounter  Medications  . carvedilol (COREG) 12.5 MG tablet    Sig: Take 1 tablet (12.5 mg total) by mouth 2 (two) times daily.    Dispense:  180 tablet    Refill:  3  . DISCONTD: sacubitril-valsartan (ENTRESTO) 24-26 MG    Sig: Take 1 tablet by mouth 2 (two) times daily.    Dispense:  60 tablet    Refill:  11    RXBIN: [id number]; RXGRP: [id number]; IWPYK: [id number]; [id number] ID: Pharmacy to complete  . sacubitril-valsartan (ENTRESTO) 24-26 MG    Sig: Take 1 tablet by mouth 2 (two) times daily.    Dispense:  60 tablet    Refill:  11    RXBIN: [id number]; RXGRP: [id number]; ZHGDJ: [id number]; ISSUER: [id number] ID:    Patient Instructions  Medication Instructions:  Your physician has recommended you make the following change in your medication:  DECREASE Coreg (Carvedilol) to 12.5 mg twice daily STOP Benazepril START Entresto (Sacubitril/Valsartan) 24-26 mg twice daily -  START on Tuesday Jan. 7 in the evening  If you need a refill on your cardiac medications before your next appointment, please call your pharmacy.   Lab work: Your physician recommends that you return for lab work on [employer]. Jan. 23 for basic metabolic panel  If you have labs (blood work) drawn today and your tests are completely normal, you will receive your results only by: Marland Kitchen MyChart Message (if you have MyChart) OR . A paper copy in the mail If you  have any lab test that is abnormal or we need to change your treatment, we will call you to review the results.  Testing/Procedures: None Ordered   Follow-Up: Your physician recommends that you return for a follow-up appointment on Thursday January 23 at 9:00 am for BP check with Nurse Sharyn Lull  Your physician recommends that you return for a follow-up appointment on February 24 with Dr. Acie Fredrickson at 8:20 am       Signed, Mertie Moores, MD  09/05/2018 6:27 PM    [place]

## 2018-09-06 LAB — CBC WITH DIFFERENTIAL/PLATELET
BASOS ABS: 0 10*3/uL (ref 0.0–0.2)
Basos: 0 %
EOS (ABSOLUTE): 0 10*3/uL (ref 0.0–0.4)
Eos: 0 %
HEMOGLOBIN: 16.8 g/dL (ref 13.0–17.7)
Hematocrit: 47.5 % (ref 37.5–51.0)
IMMATURE GRANS (ABS): 0.1 10*3/uL (ref 0.0–0.1)
IMMATURE GRANULOCYTES: 1 %
LYMPHS: 12 %
Lymphocytes Absolute: 1.4 10*3/uL (ref 0.7–3.1)
MCH: 30.5 pg (ref 26.6–33.0)
MCHC: 35.4 g/dL (ref 31.5–35.7)
MCV: 86 fL (ref 79–97)
MONOCYTES: 9 %
Monocytes Absolute: 1 10*3/uL — ABNORMAL HIGH (ref 0.1–0.9)
Neutrophils Absolute: 9.1 10*3/uL — ABNORMAL HIGH (ref 1.4–7.0)
Neutrophils: 78 %
Platelets: 154 10*3/uL (ref 150–450)
RBC: 5.5 x10E6/uL (ref 4.14–5.80)
RDW: 13.3 % (ref 11.6–15.4)
WBC: 11.6 10*3/uL — AB (ref 3.4–10.8)

## 2018-09-06 LAB — C-REACTIVE PROTEIN: CRP: 53 mg/L — AB (ref 0–10)

## 2018-09-06 LAB — SEDIMENTATION RATE: SED RATE: 9 mm/h (ref 0–30)

## 2018-09-06 NOTE — Progress Notes (Signed)
done

## 2018-09-07 ENCOUNTER — Ambulatory Visit: Payer: 59 | Admitting: Cardiovascular Disease

## 2018-09-07 MED FILL — HYDROCODON-APAP 5-325: 5-325 | 7 days supply | Qty: 50 | Fill #0

## 2018-09-07 MED FILL — ENOXAPARIN 40 MG/0.4 ML SYR: 40 | 3 days supply | Qty: 1 | Fill #0

## 2018-09-07 MED FILL — TALTZ 80 MG/ML AUTOINJECTOR: 80 | 30 days supply | Qty: 1 | Fill #1

## 2018-09-08 ENCOUNTER — Other Ambulatory Visit: Payer: Self-pay

## 2018-09-08 NOTE — H&P (Signed)
[name]. is an 58 y.o. male.    Chief Complaint:  Infected right TKA  Procedure:   I&D right TKA with poly exchange  HPI: Pt is a 58 y.o. male complaining of right knee pain and swelling. Pain had continually increased since the beginning. He is 3 months out from right TKA done 05/31/18, per Dr. Alvan Dame.  X-rays in the clinic show right TKA in good position and alignment.  Aspiration preformed in the clinic revealed infection. It was determined that he would benefit from a  I&D of the right TKA with poly exchange to try to treat the infection.  Various options are discussed with the patient. Risks, benefits and expectations were discussed with the patient. Patient understand the risks, benefits and expectations and wishes to proceed with surgery.    PCP: Denita Lung, MD  D/C Plans:       Home   Post-op Meds:       No Rx given   Tranexamic Acid:      To be given - IV   Decadron:      Is to be given  FYI:      Xarelto   Oxycodone  CPAP  DME:   Pt already has equipment    PMH: Past Medical History:  Diagnosis Date  . Arthritis   . Diverticulosis   . Dyslipidemia   . GERD (gastroesophageal reflux disease)   . Herpes labialis   . Hypertension   . Longstanding persistent atrial fibrillation   . Metabolic syndrome   . Obesity   . Proteinuria   . Psoriasis   . Seborrheic dermatitis   . Sleep apnea    very compliant with CPAP  . TIA (transient ischemic attack) 2009    PSH: Past Surgical History:  Procedure Laterality Date  . CARDIOVERSION N/A 12/02/2016   Procedure: CARDIOVERSION;  Surgeon: Pixie Casino, MD;  Location: [facility] ENDOSCOPY;  Service: Cardiovascular;  Laterality: N/A;  . COLONOSCOPY    . JOINT REPLACEMENT  11/03/10   LT HIP  . PFO occluder cardiac valve  2006   Dr. Einar Gip, (hole in heart)  . TONSILLECTOMY AND ADENOIDECTOMY    . TOTAL HIP ARTHROPLASTY Left   . TOTAL KNEE ARTHROPLASTY Right 05/31/2018   Procedure: RIGHT TOTAL KNEE ARTHROPLASTY;   Surgeon: [name], MD;  Location: [facility] ORS;  Service: Orthopedics;  Laterality: Right;  70 mins  . TOTAL KNEE ARTHROPLASTY Left 06/28/2018   Procedure: LEFT TOTAL KNEE ARTHROPLASTY;  Surgeon: [name], MD;  Location: [facility] ORS;  Service: Orthopedics;  Laterality: Left;  70 mins  . WISDOM TOOTH EXTRACTION      Social History:  reports that he has never smoked. He has never used smokeless tobacco. He reports current alcohol use. He reports that he does not use drugs.  Allergies:  Allergies  Allergen Reactions  . Morphine And Related Other (See Comments)    Hallucinations    Medications: No current facility-administered medications for this encounter.    Current Outpatient Medications  Medication Sig Dispense Refill  . acetaminophen (TYLENOL) 500 MG tablet Take 2 tablets (1,000 mg total) by mouth every 8 (eight) hours. 30 tablet 0  . carvedilol (COREG) 12.5 MG tablet Take 1 tablet (12.5 mg total) by mouth 2 (two) times daily. 180 tablet 3  . cephALEXin (KEFLEX) 500 MG capsule Take 1 capsule by mouth 4 (four) times daily.    . clobetasol (TEMOVATE) 0.05 % external solution Apply 1  application topically 2 (two) times daily as needed (for psoriasis).   0  . docusate sodium (COLACE) 100 MG capsule Take 1 capsule (100 mg total) by mouth 2 (two) times daily. 10 capsule 0  . Dulaglutide (TRULICITY) 7.85 YI/5.0YD SOPN Inject 1 applicator into the skin once a week. 4 pen 5  . ferrous sulfate (FERROUSUL) 325 (65 FE) MG tablet Take 1 tablet (325 mg total) by mouth 3 (three) times daily with meals.  3  . Ixekizumab (TALTZ) 80 MG/ML SOAJ Inject 80 mg into the skin every 30 (thirty) days. 1 mL 11  . metFORMIN (GLUCOPHAGE-XR) 500 MG 24 hr tablet Take 500 mg by mouth 2 (two) times daily.    . methocarbamol (ROBAXIN) 500 MG tablet Take 500 mg by mouth 4 (four) times daily.    Marland Kitchen oxyCODONE (OXY IR/ROXICODONE) 5 MG immediate release tablet Take 1-2 tablets (5-10 mg total) by mouth every 4 (four) hours as  needed for moderate pain or severe pain. 60 tablet 0  . oxymetazoline (AFRIN) 0.05 % nasal spray Place 1 spray into both nostrils at bedtime.    . pantoprazole (PROTONIX) 40 MG tablet Take 1 tablet (40 mg total) by mouth every other day. 90 tablet 3  . rivaroxaban (XARELTO) 20 MG TABS tablet Take 20 mg by mouth daily.    . rosuvastatin (CRESTOR) 10 MG tablet Take 1 tablet (10 mg total) by mouth daily. 90 tablet 3  . sacubitril-valsartan (ENTRESTO) 24-26 MG Take 1 tablet by mouth 2 (two) times daily. 60 tablet 11  . Vitamin D, Cholecalciferol, 1000 units CAPS Take 1,000 Units by mouth daily.         Review of Systems  Constitutional: Negative.   HENT: Negative.   Eyes: Negative.   Respiratory: Negative.   Cardiovascular: Negative.   Gastrointestinal: Positive for heartburn.  Genitourinary: Negative.   Musculoskeletal: Positive for joint pain.  Skin: Negative.   Neurological: Negative.   Endo/Heme/Allergies: Negative.   Psychiatric/Behavioral: Negative.        Physical Exam  Constitutional: He is oriented to person, place, and time. He appears well-developed.  HENT:  Head: Normocephalic.  Eyes: Pupils are equal, round, and reactive to light.  Neck: Neck supple. No JVD present. No tracheal deviation present. No thyromegaly present.  Cardiovascular: Normal rate, regular rhythm and intact distal pulses.  Respiratory: Effort normal and breath sounds normal. No respiratory distress. He has no wheezes.  GI: Soft. There is no abdominal tenderness. There is no guarding.  Musculoskeletal:     Right knee: He exhibits decreased range of motion, swelling and effusion. He exhibits no ecchymosis, no deformity, no laceration (healed previous incision) and no erythema. Tenderness found.  Lymphadenopathy:    He has no cervical adenopathy.  Neurological: He is alert and oriented to person, place, and time.  Skin: Skin is warm and dry.  Psychiatric: He has a normal mood and affect.        Assessment/Plan Assessment:   Infected right TKA  Plan: Patient will undergo a I&D right TKA with poly exchange on 09/10/18 per Dr. Alvan Dame at [facility]. Risks benefits and expectations were discussed with the patient. Patient understand risks, benefits and expectations and wishes to proceed.   [place] Pugh Lenzie Montesano   PA-C  09/08/2018, 1:27 PM

## 2018-09-08 NOTE — Progress Notes (Signed)
Called pharmacy line to inform them that Lovenox was not listed on Pharmacy list for patient .  Pharmacy personnel stated they would take care of.

## 2018-09-08 NOTE — Progress Notes (Signed)
Need orders in epic.  Surgery on 09/10/2018.

## 2018-09-09 NOTE — Anesthesia Preprocedure Evaluation (Addendum)
Anesthesia Evaluation  Patient identified by MRN, date of birth, ID band Patient awake    Reviewed: Allergy & Precautions, NPO status , Patient's Chart, lab work & pertinent test results, reviewed documented beta blocker date and time   Airway Mallampati: II  TM Distance: >3 FB Neck ROM: Full    Dental no notable dental hx.    Pulmonary sleep apnea and Continuous Positive Airway Pressure Ventilation ,    Pulmonary exam normal breath sounds clear to auscultation       Cardiovascular hypertension, Pt. on home beta blockers +CHF  Normal cardiovascular exam+ dysrhythmias Atrial Fibrillation  Rhythm:Regular Rate:Normal  ECG: A-fib, rate 103  Myocardial perfusion Nuclear stress EF: 28%. There was no ST segment deviation noted during stress. This is an intermediate risk study. The left ventricular ejection fraction is severely decreased (<30%). Findings consistent with prior myocardial infarction. 1. EF 28%, diffuse hypokinesis appearing worse towards the apex.  2. Fixed small, moderate intensity apical inferior and apical perfusion defect.  This is suggestive of prior infarction, no significant ischemia.  Intermediate risk study due to low EF.    ECHO: The patient was in atrial fibrillation. Normal LV size with mild LVH. EF 35-40%, diffuse hypokinesis. Mildly dilated RV with normal systolic function. No significant valvular abnormalities. Moderately dilated ascending aorta.  Sees cardiologist Agricultural engineer)   Neuro/Psych TIAnegative psych ROS   GI/Hepatic Neg liver ROS, GERD  Medicated and Controlled,  Endo/Other  diabetes, Oral Hypoglycemic AgentsMetabolic syndrome  Renal/GU negative Renal ROS     Musculoskeletal negative musculoskeletal ROS (+)   Abdominal (+) + obese,   Peds  Hematology HLD   Anesthesia Other Findings Infected right total knee arthorplasty  Reproductive/Obstetrics                             Anesthesia Physical Anesthesia Plan  ASA: III  Anesthesia Plan: Regional and Spinal   Post-op Pain Management:  Regional for Post-op pain   Induction:   PONV Risk Score and Plan: 1 and Treatment may vary due to age or medical condition, Ondansetron and Midazolam  Airway Management Planned: Natural Airway  Additional Equipment:   Intra-op Plan:   Post-operative Plan:   Informed Consent: I have reviewed the patients History and Physical, chart, labs and discussed the procedure including the risks, benefits and alternatives for the proposed anesthesia with the patient or authorized representative who has indicated his/her understanding and acceptance.   Dental advisory given  Plan Discussed with: CRNA  Anesthesia Plan Comments:       Anesthesia Quick Evaluation

## 2018-09-09 NOTE — Progress Notes (Signed)
Wife, Miki Blank , called and left message stating that patient thinks he is to take Lovenox on am of surgery.  Called wife back at [phone] and stated that Lovenox instructions are either given by surgeon, cardiologist or borth and that those instructions regarding Lovenox preop would need to be followed,  Wife, Clint Biello to call Ortho office to clarify regarding Lovenox preop.

## 2018-09-09 NOTE — Progress Notes (Signed)
Patient to bring own CPAP per patient.  Patient states he will need oxygen and valve to hook up.

## 2018-09-10 ENCOUNTER — Encounter (HOSPITAL_COMMUNITY)
Admission: RE | Disposition: A | Discharge status: Home Health | Payer: Self-pay | Source: Ambulatory Visit | Attending: Orthopedic Surgery

## 2018-09-10 ENCOUNTER — Other Ambulatory Visit: Payer: Self-pay

## 2018-09-10 ENCOUNTER — Inpatient Hospital Stay (HOSPITAL_COMMUNITY): Payer: 59 | Admitting: Anesthesiology

## 2018-09-10 ENCOUNTER — Inpatient Hospital Stay: Payer: Self-pay

## 2018-09-10 ENCOUNTER — Encounter (HOSPITAL_COMMUNITY): Payer: Self-pay | Admitting: *Deleted

## 2018-09-10 ENCOUNTER — Inpatient Hospital Stay (HOSPITAL_COMMUNITY)
Admission: RE | Admit: 2018-09-10 | Discharge: 2018-09-12 | DRG: 464 | Disposition: A | Discharge status: Home Health | Payer: 59 | Source: Ambulatory Visit | Attending: Orthopedic Surgery | Admitting: Orthopedic Surgery

## 2018-09-10 DIAGNOSIS — T8453XA Infection and inflammatory reaction due to internal right knee prosthesis, initial encounter: Principal | ICD-10-CM

## 2018-09-10 DIAGNOSIS — I252 Old myocardial infarction: Secondary | ICD-10-CM

## 2018-09-10 DIAGNOSIS — E669 Obesity, unspecified: Secondary | ICD-10-CM | POA: Diagnosis present

## 2018-09-10 DIAGNOSIS — Z96653 Presence of artificial knee joint, bilateral: Secondary | ICD-10-CM

## 2018-09-10 DIAGNOSIS — Z96651 Presence of right artificial knee joint: Secondary | ICD-10-CM | POA: Diagnosis present

## 2018-09-10 DIAGNOSIS — Z7901 Long term (current) use of anticoagulants: Secondary | ICD-10-CM

## 2018-09-10 DIAGNOSIS — Z96642 Presence of left artificial hip joint: Secondary | ICD-10-CM | POA: Diagnosis present

## 2018-09-10 DIAGNOSIS — T8459XA Infection and inflammatory reaction due to other internal joint prosthesis, initial encounter: Secondary | ICD-10-CM

## 2018-09-10 DIAGNOSIS — I4811 Longstanding persistent atrial fibrillation: Secondary | ICD-10-CM | POA: Diagnosis present

## 2018-09-10 DIAGNOSIS — I10 Essential (primary) hypertension: Secondary | ICD-10-CM | POA: Diagnosis present

## 2018-09-10 DIAGNOSIS — E119 Type 2 diabetes mellitus without complications: Secondary | ICD-10-CM | POA: Diagnosis present

## 2018-09-10 DIAGNOSIS — Z7984 Long term (current) use of oral hypoglycemic drugs: Secondary | ICD-10-CM | POA: Diagnosis not present

## 2018-09-10 DIAGNOSIS — G4730 Sleep apnea, unspecified: Secondary | ICD-10-CM | POA: Diagnosis present

## 2018-09-10 DIAGNOSIS — Z885 Allergy status to narcotic agent status: Secondary | ICD-10-CM

## 2018-09-10 DIAGNOSIS — E785 Hyperlipidemia, unspecified: Secondary | ICD-10-CM | POA: Diagnosis present

## 2018-09-10 DIAGNOSIS — B9561 Methicillin susceptible Staphylococcus aureus infection as the cause of diseases classified elsewhere: Secondary | ICD-10-CM | POA: Diagnosis present

## 2018-09-10 DIAGNOSIS — Y831 Surgical operation with implant of artificial internal device as the cause of abnormal reaction of the patient, or of later complication, without mention of misadventure at the time of the procedure: Secondary | ICD-10-CM | POA: Diagnosis present

## 2018-09-10 DIAGNOSIS — Z96659 Presence of unspecified artificial knee joint: Secondary | ICD-10-CM

## 2018-09-10 DIAGNOSIS — Z452 Encounter for adjustment and management of vascular access device: Secondary | ICD-10-CM | POA: Diagnosis not present

## 2018-09-10 DIAGNOSIS — Z8673 Personal history of transient ischemic attack (TIA), and cerebral infarction without residual deficits: Secondary | ICD-10-CM

## 2018-09-10 DIAGNOSIS — Z79899 Other long term (current) drug therapy: Secondary | ICD-10-CM

## 2018-09-10 DIAGNOSIS — L03119 Cellulitis of unspecified part of limb: Secondary | ICD-10-CM | POA: Diagnosis not present

## 2018-09-10 DIAGNOSIS — G8918 Other acute postprocedural pain: Secondary | ICD-10-CM | POA: Diagnosis not present

## 2018-09-10 DIAGNOSIS — M150 Primary generalized (osteo)arthritis: Secondary | ICD-10-CM | POA: Diagnosis not present

## 2018-09-10 DIAGNOSIS — K219 Gastro-esophageal reflux disease without esophagitis: Secondary | ICD-10-CM | POA: Diagnosis present

## 2018-09-10 DIAGNOSIS — Z95828 Presence of other vascular implants and grafts: Secondary | ICD-10-CM

## 2018-09-10 HISTORY — PX: I & D KNEE WITH POLY EXCHANGE: SHX5024

## 2018-09-10 LAB — GLUCOSE, CAPILLARY
Glucose-Capillary: 147 mg/dL — ABNORMAL HIGH (ref 70–99)
Glucose-Capillary: 142 mg/dL — ABNORMAL HIGH (ref 70–99)
Glucose-Capillary: 172 mg/dL — ABNORMAL HIGH (ref 70–99)
Glucose-Capillary: 124 mg/dL — ABNORMAL HIGH (ref 70–99)

## 2018-09-10 LAB — BASIC METABOLIC PANEL
Anion gap: 10 (ref 5–15)
BUN: 21 mg/dL — ABNORMAL HIGH (ref 6–20)
CO2: 22 mmol/L (ref 22–32)
Calcium: 8.9 mg/dL (ref 8.9–10.3)
Chloride: 103 mmol/L (ref 98–111)
Creatinine, Ser: 0.92 mg/dL (ref 0.61–1.24)
GFR calc Af Amer: >60 mL/min (ref >60)
GFR calc non Af Amer: >60 mL/min (ref >60)
Glucose, Bld: 151 mg/dL — ABNORMAL HIGH (ref 70–99)
Potassium: 3.9 mmol/L (ref 3.5–5.1)
Sodium: 135 mmol/L (ref 135–145)

## 2018-09-10 LAB — PROTIME-INR
INR: 1.11
Prothrombin Time: 14.2 seconds (ref 11.4–15.2)

## 2018-09-10 LAB — TYPE AND SCREEN
ABO/RH(D): O POS
Antibody Screen: NEGATIVE

## 2018-09-10 LAB — HEMOGLOBIN A1C
Hgb A1c MFr Bld: 5.4 % (ref 4.8–5.6)
Mean Plasma Glucose: 108.28 mg/dL

## 2018-09-10 SURGERY — IRRIGATION AND DEBRIDEMENT KNEE WITH POLY EXCHANGE
Anesthesia: Regional | Site: Knee | Laterality: Right

## 2018-09-10 MED ORDER — VANCOMYCIN HCL 1000 MG IV SOLR
INTRAVENOUS | Status: DC | PRN
  Administered 2018-09-10: 1000 mg via TOPICAL

## 2018-09-10 MED ORDER — RIVAROXABAN 10 MG PO TABS
10.0000 mg | ORAL_TABLET | ORAL | Status: DC
  Administered 2018-09-11 – 2018-09-12 (×2): 10 mg via ORAL
  Filled 2018-09-10 (×2): qty 1

## 2018-09-10 MED ORDER — PANTOPRAZOLE SODIUM 40 MG PO TBEC
40.0000 mg | DELAYED_RELEASE_TABLET | ORAL | Status: DC
  Administered 2018-09-12: 40 mg via ORAL
  Filled 2018-09-10: qty 1

## 2018-09-10 MED ORDER — VANCOMYCIN HCL IN DEXTROSE 1-5 GM/200ML-% IV SOLN
INTRAVENOUS | Status: AC
  Filled 2018-09-10: qty 200

## 2018-09-10 MED ORDER — METHOCARBAMOL 500 MG IVPB - SIMPLE MED
500.0000 mg | Freq: Four times a day (QID) | INTRAVENOUS | Status: DC | PRN
  Administered 2018-09-11: 500 mg via INTRAVENOUS
  Filled 2018-09-10: qty 500
  Filled 2018-09-10: qty 50

## 2018-09-10 MED ORDER — DIPHENHYDRAMINE HCL 12.5 MG/5ML PO ELIX: 12.5000 mg | ORAL_SOLUTION | ORAL | Status: DC | PRN

## 2018-09-10 MED ORDER — FENTANYL CITRATE (PF) 100 MCG/2ML IJ SOLN: 25.0000 ug | INTRAMUSCULAR | Status: DC | PRN

## 2018-09-10 MED ORDER — PROPOFOL 10 MG/ML IV BOLUS
INTRAVENOUS | Status: AC
  Filled 2018-09-10: qty 20

## 2018-09-10 MED ORDER — METOCLOPRAMIDE HCL 5 MG PO TABS: 5.0000 mg | ORAL_TABLET | Freq: Three times a day (TID) | ORAL | Status: DC | PRN

## 2018-09-10 MED ORDER — MAGNESIUM CITRATE PO SOLN: 1.0000 | Freq: Once | ORAL | Status: DC | PRN

## 2018-09-10 MED ORDER — CHLORHEXIDINE GLUCONATE 4 % EX LIQD: 60.0000 mL | Freq: Once | CUTANEOUS | Status: DC

## 2018-09-10 MED ORDER — INSULIN ASPART 100 UNIT/ML ~~LOC~~ SOLN
0.0000 [IU] | Freq: Three times a day (TID) | SUBCUTANEOUS | Status: DC
  Administered 2018-09-10: 3 [IU] via SUBCUTANEOUS
  Administered 2018-09-11 (×2): 5 [IU] via SUBCUTANEOUS
  Administered 2018-09-11 – 2018-09-12 (×2): 2 [IU] via SUBCUTANEOUS

## 2018-09-10 MED ORDER — ACETAMINOPHEN 500 MG PO TABS
1000.0000 mg | ORAL_TABLET | Freq: Four times a day (QID) | ORAL | Status: AC
  Administered 2018-09-10: 1000 mg via ORAL
  Filled 2018-09-10: qty 2

## 2018-09-10 MED ORDER — ONDANSETRON HCL 4 MG/2ML IJ SOLN: 4.0000 mg | Freq: Four times a day (QID) | INTRAMUSCULAR | Status: DC | PRN

## 2018-09-10 MED ORDER — MIDAZOLAM HCL 5 MG/5ML IJ SOLN
INTRAMUSCULAR | Status: DC | PRN
  Administered 2018-09-10: 2 mg via INTRAVENOUS

## 2018-09-10 MED ORDER — CEFAZOLIN SODIUM-DEXTROSE 2-3 GM-%(50ML) IV SOLR
INTRAVENOUS | Status: DC | PRN
  Administered 2018-09-10: 2 g via INTRAVENOUS

## 2018-09-10 MED ORDER — KETOROLAC TROMETHAMINE 30 MG/ML IJ SOLN
INTRAMUSCULAR | Status: DC | PRN
  Administered 2018-09-10: 30 mg via INTRAMUSCULAR

## 2018-09-10 MED ORDER — FENTANYL CITRATE (PF) 250 MCG/5ML IJ SOLN
INTRAMUSCULAR | Status: DC | PRN
  Administered 2018-09-10: 50 ug via INTRAVENOUS

## 2018-09-10 MED ORDER — ROPIVACAINE HCL 5 MG/ML IJ SOLN
INTRAMUSCULAR | Status: DC | PRN
  Administered 2018-09-10: 30 mL via INTRATHECAL

## 2018-09-10 MED ORDER — ONDANSETRON HCL 4 MG PO TABS: 4.0000 mg | ORAL_TABLET | Freq: Four times a day (QID) | ORAL | Status: DC | PRN

## 2018-09-10 MED ORDER — CLOBETASOL PROPIONATE 0.05 % EX SOLN: 1.0000 "application " | Freq: Two times a day (BID) | CUTANEOUS | Status: DC | PRN

## 2018-09-10 MED ORDER — FERROUS SULFATE 325 (65 FE) MG PO TABS
325.0000 mg | ORAL_TABLET | Freq: Two times a day (BID) | ORAL | Status: DC
  Administered 2018-09-10 – 2018-09-12 (×4): 325 mg via ORAL
  Filled 2018-09-10 (×4): qty 1

## 2018-09-10 MED ORDER — MIDAZOLAM HCL 2 MG/2ML IJ SOLN
INTRAMUSCULAR | Status: AC
  Filled 2018-09-10: qty 2

## 2018-09-10 MED ORDER — PROPOFOL 500 MG/50ML IV EMUL
INTRAVENOUS | Status: DC | PRN
  Administered 2018-09-10: 75 ug/kg/min via INTRAVENOUS

## 2018-09-10 MED ORDER — OXYCODONE HCL 5 MG PO TABS
10.0000 mg | ORAL_TABLET | ORAL | Status: DC | PRN
  Administered 2018-09-10 – 2018-09-11 (×3): 10 mg via ORAL
  Filled 2018-09-10 (×3): qty 2

## 2018-09-10 MED ORDER — PHENOL 1.4 % MT LIQD: 1.0000 | OROMUCOSAL | Status: DC | PRN

## 2018-09-10 MED ORDER — VANCOMYCIN HCL 1000 MG IV SOLR
INTRAVENOUS | Status: AC
  Filled 2018-09-10: qty 1000

## 2018-09-10 MED ORDER — DOCUSATE SODIUM 100 MG PO CAPS
100.0000 mg | ORAL_CAPSULE | Freq: Two times a day (BID) | ORAL | Status: DC
  Administered 2018-09-11 – 2018-09-12 (×3): 100 mg via ORAL
  Filled 2018-09-10 (×3): qty 1

## 2018-09-10 MED ORDER — SODIUM CHLORIDE (PF) 0.9 % IJ SOLN
INTRAMUSCULAR | Status: DC | PRN
  Administered 2018-09-10: 30 mL

## 2018-09-10 MED ORDER — VANCOMYCIN HCL 1000 MG IV SOLR
INTRAVENOUS | Status: DC | PRN
  Administered 2018-09-10: 1000 mg via INTRAVENOUS

## 2018-09-10 MED ORDER — BUPIVACAINE-EPINEPHRINE (PF) 0.25% -1:200000 IJ SOLN
INTRAMUSCULAR | Status: AC
  Filled 2018-09-10: qty 30

## 2018-09-10 MED ORDER — BUPIVACAINE-EPINEPHRINE (PF) 0.25% -1:200000 IJ SOLN
INTRAMUSCULAR | Status: DC | PRN
  Administered 2018-09-10: 30 mL

## 2018-09-10 MED ORDER — ONDANSETRON HCL 4 MG/2ML IJ SOLN: 4.0000 mg | Freq: Once | INTRAMUSCULAR | Status: DC | PRN

## 2018-09-10 MED ORDER — ALUM & MAG HYDROXIDE-SIMETH 200-200-20 MG/5ML PO SUSP: 15.0000 mL | ORAL | Status: DC | PRN

## 2018-09-10 MED ORDER — ONDANSETRON HCL 4 MG/2ML IJ SOLN
INTRAMUSCULAR | Status: AC
  Filled 2018-09-10: qty 2

## 2018-09-10 MED ORDER — LACTATED RINGERS IV SOLN
INTRAVENOUS | Status: DC
  Administered 2018-09-10 (×2): via INTRAVENOUS

## 2018-09-10 MED ORDER — CARVEDILOL 12.5 MG PO TABS
12.5000 mg | ORAL_TABLET | Freq: Two times a day (BID) | ORAL | Status: DC
  Administered 2018-09-10 – 2018-09-12 (×4): 12.5 mg via ORAL
  Filled 2018-09-10 (×4): qty 1

## 2018-09-10 MED ORDER — DEXAMETHASONE SODIUM PHOSPHATE 10 MG/ML IJ SOLN
10.0000 mg | Freq: Once | INTRAMUSCULAR | Status: AC
  Administered 2018-09-11: 10 mg via INTRAVENOUS
  Filled 2018-09-10: qty 1

## 2018-09-10 MED ORDER — CEFAZOLIN SODIUM-DEXTROSE 2-4 GM/100ML-% IV SOLN
2.0000 g | Freq: Four times a day (QID) | INTRAVENOUS | Status: DC
  Filled 2018-09-10: qty 100

## 2018-09-10 MED ORDER — CEFAZOLIN SODIUM-DEXTROSE 2-4 GM/100ML-% IV SOLN
INTRAVENOUS | Status: AC
  Administered 2018-09-10: 2000 mg
  Filled 2018-09-10: qty 100

## 2018-09-10 MED ORDER — MENTHOL 3 MG MT LOZG: 1.0000 | LOZENGE | OROMUCOSAL | Status: DC | PRN

## 2018-09-10 MED ORDER — BISACODYL 10 MG RE SUPP: 10.0000 mg | Freq: Every day | RECTAL | Status: DC | PRN

## 2018-09-10 MED ORDER — BUPIVACAINE IN DEXTROSE 0.75-8.25 % IT SOLN
INTRATHECAL | Status: DC | PRN
  Administered 2018-09-10: 1.6 mL via INTRATHECAL

## 2018-09-10 MED ORDER — SODIUM CHLORIDE 0.9 % IV SOLN
INTRAVENOUS | Status: DC
  Administered 2018-09-10: 21:00:00 via INTRAVENOUS

## 2018-09-10 MED ORDER — METHOCARBAMOL 500 MG PO TABS
500.0000 mg | ORAL_TABLET | Freq: Four times a day (QID) | ORAL | Status: DC | PRN
  Administered 2018-09-10 – 2018-09-12 (×5): 500 mg via ORAL
  Filled 2018-09-10 (×5): qty 1

## 2018-09-10 MED ORDER — SODIUM CHLORIDE 0.9 % IV SOLN
INTRAVENOUS | Status: DC | PRN
  Administered 2018-09-10: 25 ug/min via INTRAVENOUS

## 2018-09-10 MED ORDER — STERILE WATER FOR IRRIGATION IR SOLN
Status: DC | PRN
  Administered 2018-09-10: 2000 mL

## 2018-09-10 MED ORDER — HYDROGEN PEROXIDE 3 % EX SOLN
CUTANEOUS | Status: AC
  Filled 2018-09-10: qty 473

## 2018-09-10 MED ORDER — OXYCODONE HCL 5 MG PO TABS
5.0000 mg | ORAL_TABLET | ORAL | Status: DC | PRN
  Administered 2018-09-10: 5 mg via ORAL
  Filled 2018-09-10: qty 1

## 2018-09-10 MED ORDER — ROSUVASTATIN CALCIUM 10 MG PO TABS
10.0000 mg | ORAL_TABLET | Freq: Every day | ORAL | Status: DC
  Administered 2018-09-10 – 2018-09-12 (×3): 10 mg via ORAL
  Filled 2018-09-10 (×3): qty 1

## 2018-09-10 MED ORDER — TRANEXAMIC ACID-NACL 1000-0.7 MG/100ML-% IV SOLN
1000.0000 mg | INTRAVENOUS | Status: AC
  Administered 2018-09-10: 1000 mg via INTRAVENOUS
  Filled 2018-09-10: qty 100

## 2018-09-10 MED ORDER — DEXAMETHASONE SODIUM PHOSPHATE 10 MG/ML IJ SOLN: 10.0000 mg | Freq: Once | INTRAMUSCULAR | Status: DC

## 2018-09-10 MED ORDER — POLYETHYLENE GLYCOL 3350 17 G PO PACK
17.0000 g | PACK | Freq: Two times a day (BID) | ORAL | Status: DC
  Filled 2018-09-10: qty 1

## 2018-09-10 MED ORDER — KETOROLAC TROMETHAMINE 30 MG/ML IJ SOLN
INTRAMUSCULAR | Status: AC
  Filled 2018-09-10: qty 1

## 2018-09-10 MED ORDER — METFORMIN HCL ER 500 MG PO TB24
500.0000 mg | ORAL_TABLET | Freq: Two times a day (BID) | ORAL | Status: DC
  Administered 2018-09-10 – 2018-09-12 (×4): 500 mg via ORAL
  Filled 2018-09-10 (×4): qty 1

## 2018-09-10 MED ORDER — 0.9 % SODIUM CHLORIDE (POUR BTL) OPTIME
TOPICAL | Status: DC | PRN
  Administered 2018-09-10: 1000 mL

## 2018-09-10 MED ORDER — SODIUM CHLORIDE (PF) 0.9 % IJ SOLN
INTRAMUSCULAR | Status: AC
  Filled 2018-09-10: qty 50

## 2018-09-10 MED ORDER — METOCLOPRAMIDE HCL 5 MG/ML IJ SOLN: 5.0000 mg | Freq: Three times a day (TID) | INTRAMUSCULAR | Status: DC | PRN

## 2018-09-10 MED ORDER — ONDANSETRON HCL 4 MG/2ML IJ SOLN
INTRAMUSCULAR | Status: DC | PRN
  Administered 2018-09-10: 4 mg via INTRAVENOUS

## 2018-09-10 MED ORDER — TRANEXAMIC ACID-NACL 1000-0.7 MG/100ML-% IV SOLN
1000.0000 mg | Freq: Once | INTRAVENOUS | Status: AC
  Administered 2018-09-10: 1000 mg via INTRAVENOUS
  Filled 2018-09-10: qty 100

## 2018-09-10 MED ORDER — SODIUM CHLORIDE 0.9 % IV SOLN
2.0000 g | INTRAVENOUS | Status: DC
  Administered 2018-09-10 – 2018-09-11 (×2): 2 g via INTRAVENOUS
  Filled 2018-09-10 (×2): qty 2

## 2018-09-10 MED ORDER — FENTANYL CITRATE (PF) 250 MCG/5ML IJ SOLN
INTRAMUSCULAR | Status: AC
  Filled 2018-09-10: qty 5

## 2018-09-10 MED ORDER — OXYMETAZOLINE HCL 0.05 % NA SOLN
1.0000 | Freq: Every day | NASAL | Status: DC
  Administered 2018-09-10 – 2018-09-11 (×2): 1 via NASAL
  Filled 2018-09-10: qty 15

## 2018-09-10 SURGICAL SUPPLY — 51 items
ATTUNE PSRP INSR SZ6 6 KNEE (Insert) ×2 IMPLANT
BAG ZIPLOCK 12X15 (MISCELLANEOUS) ×2 IMPLANT
BANDAGE ACE 4X5 VEL STRL LF (GAUZE/BANDAGES/DRESSINGS) IMPLANT
BANDAGE ACE 6X5 VEL STRL LF (GAUZE/BANDAGES/DRESSINGS) ×2 IMPLANT
BANDAGE ELASTIC 6 VELCRO ST LF (GAUZE/BANDAGES/DRESSINGS) ×2 IMPLANT
BLADE SAW SGTL 11.0X1.19X90.0M (BLADE) IMPLANT
CLOTH BEACON ORANGE TIMEOUT ST (SAFETY) ×2 IMPLANT
COVER SURGICAL LIGHT HANDLE (MISCELLANEOUS) ×2 IMPLANT
COVER WAND RF STERILE (DRAPES) IMPLANT
CUFF TOURN SGL QUICK 34 (TOURNIQUET CUFF) ×1
CUFF TRNQT CYL 34X4X40X1 (TOURNIQUET CUFF) ×1 IMPLANT
DECANTER SPIKE VIAL GLASS SM (MISCELLANEOUS) ×2 IMPLANT
DERMABOND ADVANCED (GAUZE/BANDAGES/DRESSINGS) ×1
DERMABOND ADVANCED .7 DNX12 (GAUZE/BANDAGES/DRESSINGS) ×1 IMPLANT
DRAPE U-SHAPE 47X51 STRL (DRAPES) ×2 IMPLANT
DRSG AQUACEL AG ADV 3.5X10 (GAUZE/BANDAGES/DRESSINGS) ×2 IMPLANT
DRSG AQUACEL AG ADV 3.5X14 (GAUZE/BANDAGES/DRESSINGS) IMPLANT
DURAPREP 26ML APPLICATOR (WOUND CARE) ×4 IMPLANT
ELECT REM PT RETURN 15FT ADLT (MISCELLANEOUS) ×2 IMPLANT
GLOVE BIOGEL M 7.0 STRL (GLOVE) ×2 IMPLANT
GLOVE BIOGEL PI IND STRL 7.5 (GLOVE) ×1 IMPLANT
GLOVE BIOGEL PI IND STRL 8.5 (GLOVE) ×1 IMPLANT
GLOVE BIOGEL PI INDICATOR 7.5 (GLOVE) ×1
GLOVE BIOGEL PI INDICATOR 8.5 (GLOVE) ×1
GLOVE ECLIPSE 8.0 STRL XLNG CF (GLOVE) ×6 IMPLANT
GLOVE ORTHO TXT STRL SZ7.5 (GLOVE) ×6 IMPLANT
GOWN STRL REUS W/TWL LRG LVL3 (GOWN DISPOSABLE) ×4 IMPLANT
GOWN STRL REUS W/TWL XL LVL3 (GOWN DISPOSABLE) ×4 IMPLANT
HANDPIECE INTERPULSE COAX TIP (DISPOSABLE) ×1
MANIFOLD NEPTUNE II (INSTRUMENTS) ×2 IMPLANT
NDL SAFETY ECLIPSE 18X1.5 (NEEDLE) ×1 IMPLANT
NEEDLE HYPO 18GX1.5 SHARP (NEEDLE) ×1
NS IRRIG 1000ML POUR BTL (IV SOLUTION) ×4 IMPLANT
PACK TOTAL KNEE CUSTOM (KITS) ×2 IMPLANT
PROTECTOR NERVE ULNAR (MISCELLANEOUS) ×2 IMPLANT
SET HNDPC FAN SPRY TIP SCT (DISPOSABLE) ×1 IMPLANT
SPONGE LAP 18X18 RF (DISPOSABLE) IMPLANT
STAPLER VISISTAT 35W (STAPLE) IMPLANT
SUT MNCRL AB 3-0 PS2 18 (SUTURE) ×2 IMPLANT
SUT MNCRL AB 4-0 PS2 18 (SUTURE) IMPLANT
SUT STRATAFIX 0 PDS 27 VIOLET (SUTURE) ×2
SUT VIC AB 1 CT1 36 (SUTURE) ×2 IMPLANT
SUT VIC AB 2-0 CT1 27 (SUTURE) ×3
SUT VIC AB 2-0 CT1 TAPERPNT 27 (SUTURE) ×3 IMPLANT
SUTURE STRATFX 0 PDS 27 VIOLET (SUTURE) ×1 IMPLANT
SWAB COLLECTION DEVICE MRSA (MISCELLANEOUS) IMPLANT
SWAB CULTURE ESWAB REG 1ML (MISCELLANEOUS) IMPLANT
SYR 50ML LL SCALE MARK (SYRINGE) ×2 IMPLANT
TRAY FOLEY MTR SLVR 16FR STAT (SET/KITS/TRAYS/PACK) ×2 IMPLANT
WATER STERILE IRR 1000ML POUR (IV SOLUTION) ×2 IMPLANT
WRAP KNEE MAXI GEL POST OP (GAUZE/BANDAGES/DRESSINGS) ×2 IMPLANT

## 2018-09-10 NOTE — Anesthesia Procedure Notes (Signed)
Spinal  Patient location during procedure: OR Start time: 09/10/2018 10:40 AM End time: 09/10/2018 10:45 AM Staffing Anesthesiologist: Murvin Natal, MD Performed: anesthesiologist  Preanesthetic Checklist Completed: patient identified, surgical consent, pre-op evaluation, timeout performed, IV checked, risks and benefits discussed and monitors and equipment checked Spinal Block Patient position: sitting Prep: DuraPrep Patient monitoring: cardiac monitor, continuous pulse ox and blood pressure Approach: midline Location: L4-5 Injection technique: single-shot Needle Needle type: Pencan  Needle gauge: 24 G Needle length: 9 cm Assessment Sensory level: T10 Additional Notes Functioning IV was confirmed and monitors were applied. Sterile prep and drape, including hand hygiene and sterile gloves were used. The patient was positioned and the spine was prepped. The skin was anesthetized with lidocaine.  Free flow of clear CSF was obtained prior to injecting local anesthetic into the CSF.  The spinal needle aspirated freely following injection.  The needle was carefully withdrawn.  The patient tolerated the procedure well.

## 2018-09-10 NOTE — Progress Notes (Signed)
[facility] CRNA Assisted Dr. Roanna Banning with right, ultrasound guided, adductor canal block. Side rails up, monitors on throughout procedure. See vital signs in flow sheet. Tolerated Procedure well.

## 2018-09-10 NOTE — Interval H&P Note (Signed)
History and Physical Interval Note:  09/10/2018 10:27 AM  [name]  has presented today for surgery, with the diagnosis of Infected right total knee arthorplasty  The various methods of treatment have been discussed with the patient and family. After consideration of risks, benefits and other options for treatment, the patient has consented to  Procedure(s): Right Knee Arthroplasty IRRIGATION AND DEBRIDEMENT KNEE WITH POLY EXCHANGE (Right) as a surgical intervention .  The patient's history has been reviewed, patient examined, no change in status, stable for surgery.  I have reviewed the patient's chart and labs.  Questions were answered to the patient's satisfaction.     Mauri Pole

## 2018-09-10 NOTE — Consult Note (Signed)
[place] for Infectious Disease    Date of Admission:  09/10/2018   Total days of antibiotics 5        Day 1 vancomycin        Day 1 cefazolin               Reason for Consult: Right prosthetic knee infection    Referring Provider: Dr. Adriana Mccallum Primary Care Provider: Dr. 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  I am hopeful that cultures will yield a pathogen so that we do not have to treat him with broad empiric therapy.  He will need PICC placement unless cultures yield a gram-negative rod which can be treated with oral antibiotic therapy.  I will wait at least until tomorrow to make that decision.  For now I will treat him with vancomycin and ceftriaxone to cover staph and gram-negative rods.  He is not a candidate for oral rifampin therapy since he has a long-term requirement for anticoagulation.  Plan: 1. Continue vancomycin 2. Change cefazolin to ceftriaxone 3. Await culture results  Principal Problem:   Infected right TKA Active Problems:   S/P TKR (total knee replacement), right   Scheduled Meds: . acetaminophen  1,000 mg Oral Q6H  . carvedilol  12.5 mg Oral BID  . [START ON 09/11/2018] dexamethasone  10 mg Intravenous Once  . docusate sodium  100 mg Oral BID  . ferrous sulfate  325 mg Oral BID WC  . insulin aspart  0-15 Units Subcutaneous TID WC  . metFORMIN  500 mg Oral BID WC  . oxymetazoline  1 spray Each Nare QHS  . [START ON 09/12/2018] pantoprazole  40 mg Oral QODAY  . polyethylene glycol  17 g Oral BID  . [START ON 09/11/2018] rivaroxaban  10 mg Oral Q24H  . rosuvastatin  10 mg Oral Daily   Continuous Infusions: . sodium chloride    . ceFAZolin    .  ceFAZolin (ANCEF) IV    . lactated ringers 100 mL/hr at 09/10/18 0932  . methocarbamol (ROBAXIN) IV    . vancomycin     PRN Meds:.alum & mag hydroxide-simeth, bisacodyl, diphenhydrAMINE, fentaNYL (SUBLIMAZE) injection, magnesium citrate,  menthol-cetylpyridinium **OR** phenol, methocarbamol **OR** methocarbamol (ROBAXIN) IV, metoCLOPramide **OR** metoCLOPramide (REGLAN) injection, ondansetron **OR** ondansetron (ZOFRAN) IV, oxyCODONE, oxyCODONE  HPI: [name]. is a 58 y.o. male who underwent right total knee arthroplasty on 05/31/2018.  He had no problems healing postoperatively.  He underwent left total knee arthroplasty on 06/28/2018.  He did have a superficial stitch abscess which required some wound care and 2 rounds of antibiotics before it finally healed.  He has done very well with his physical therapy and was feeling well until 6 days ago when he had sudden onset of rigors, pain and swelling of his right knee.  He was seen in Dr. Aurea Graff office on 09/05/2018 and underwent arthrocentesis.  I do not have the results but I was told that there was clear evidence of infection.  Dr. Alvan Dame told me that so far no positive cultures have been reported from the outside lab that they use.  He was started on oral cephalexin.  It is noted that his fever, chills and sweats have resolved over the past 4 days but he had persistent severe pain and swelling of his right knee.  He was admitted this morning and underwent incision and drainage with polyethylene  exchange.   Review of Systems: Review of Systems  Constitutional: Positive for chills, diaphoresis, fever and malaise/fatigue.  Respiratory: Negative for cough.   Cardiovascular: Negative for chest pain.  Gastrointestinal: Negative for abdominal pain, diarrhea, nausea and vomiting.  Musculoskeletal: Positive for joint pain.  Skin: Negative for rash.    Past Medical History:  Diagnosis Date  . Arthritis   . Diverticulosis   . Dyslipidemia   . GERD (gastroesophageal reflux disease)   . Herpes labialis   . Hypertension   . Longstanding persistent atrial fibrillation   . Metabolic syndrome   . Obesity   . Proteinuria   . Psoriasis   . Seborrheic dermatitis   . Sleep apnea     very compliant with CPAP  . TIA (transient ischemic attack) 2009    Social History   Tobacco Use  . Smoking status: Never Smoker  . Smokeless tobacco: Never Used  Substance Use Topics  . Alcohol use: Yes    Comment: 2/month  . Drug use: No    Family History  Problem Relation Age of Onset  . Uterine cancer Mother 57       Cervical cath  . Crohn's disease Mother   . Esophageal cancer Father   . Cancer Other   . Obesity Other    Allergies  Allergen Reactions  . Morphine And Related Other (See Comments)    Hallucinations    OBJECTIVE: Blood pressure (!) 136/91, pulse 69, temperature (!) 97.5 F (36.4 C), temperature source Oral, resp. rate 16, height 5\' 10"  (1.778 m), weight 110.3 kg, SpO2 98 %.  Physical Exam Constitutional:      Comments: He appears worn out but otherwise in good spirits.  Musculoskeletal:     Comments: His right knee is wrapped.  Psychiatric:        Mood and Affect: Mood normal.     Lab Results Lab Results  Component Value Date   WBC 11.6 (H) 09/05/2018   HGB 16.8 09/05/2018   HCT 47.5 09/05/2018   MCV 86 09/05/2018   PLT 154 09/05/2018    Lab Results  Component Value Date   CREATININE 0.92 09/10/2018   BUN 21 (H) 09/10/2018   NA 135 09/10/2018   K 3.9 09/10/2018   CL 103 09/10/2018   CO2 22 09/10/2018    Lab Results  Component Value Date   ALT 31 10/18/2017   AST 27 10/18/2017   ALKPHOS 54 10/18/2017   BILITOT 1.1 10/18/2017     Microbiology: Recent Results (from the past 240 hour(s))  Aerobic/Anaerobic Culture (surgical/deep wound)     Status: None (Preliminary result)   Collection Time: 09/10/18 11:14 AM  Result Value Ref Range Status   Specimen Description SYNOVIAL RIGHT KNEE  Final   Special Requests   Final    NONE Performed at [facility], [place] [address], [place], [place] [address]    Gram Stain PENDING  Incomplete   Culture PENDING  Incomplete   Report Status PENDING  Incomplete    Michel Bickers,  MD [place] [facility] [place] [phone] pager   [phone] cell 09/10/2018, 5:11 PM

## 2018-09-10 NOTE — Op Note (Signed)
NAME: William, Delgado MEDICAL RECORD [id number] ACCOUNT [id number] DATE OF BIRTH:04-Jul-1961 FACILITY: [facility] LOCATION: [id number] PHYSICIAN:Andron Marrazzo William Sorrow, MD  OPERATIVE REPORT  DATE OF PROCEDURE:  09/10/2018  PREOPERATIVE DIAGNOSIS:  Acute infection, right total knee arthroplasty.  POSTOPERATIVE DIAGNOSIS:  Acute infection, right total knee arthroplasty.  PROCEDURE:   1.  An excisional debridement, right knee including the excision of a 6-8 inch incision including skin, subcutaneous tissue, followed by sharp excision of synovial tissue inside the knee. 2.  Non-excisional debridement of right knee with first 3 L normal saline solution followed by a mixture of Betadine solution and hydrogen peroxide, which was retained in the wound for greater than 5 minutes followed by a followup excisional and  non-excisional debridement with 3 L normal saline solution.  We did a polyethylene exchange removing the old 6 mm insert for a 6 femur and replaced it with a new one.  SURGEON:  [name], MD  ASSISTANT:  Danae Orleans PA-C.  Note that Mr. [name] was present for the entire case from preoperative positioning, perioperative management of the operative extremity, general facilitation of the case and primary wound closure.  ANESTHESIA:  Regional plus spinal.  SPECIMENS:  Approximately 8 mL of fluid from the joint was taken and will be sent to pathology for further evaluation and assessment.  DRAINS:  None.  COMPLICATIONS:  None.  TOURNIQUET TIME:  46 minutes at 250 mmHg.  INDICATIONS:  The patient is a very pleasant 58 year old male who has undergone bilateral total knee replacements.  The right total knee replacement was performed about 5 months ago.  He recovered from this knee very well.  He subsequently had his left  knee performed in a staged fashion.  He had no problems with either knee until he presented to the office on Monday with a 2-day onset of increasing pain, low-grade  fever and swelling.  His knee was aspirated noting some purulence.  It was sent off for  Synovasure and was positive concern for infection with positive Alpha Defensin as well as a positive white count.  His sed rate and CRP were elevated as well.  He was set up for surgery upon seeing me in the office this week for today.  Due to the acuity  of his presentation as well as his initial recovery, I did not feel this was a long-term issue and for some reason infection developed.  He does have history of diabetes, but is well controlled.  He did not have any wound problems.  Risks and benefits  of a single stage washout were discussed and reviewed with him in the setting of this procedure.  Consent was obtained for above.  DESCRIPTION OF PROCEDURE:  The patient was brought to the operative theater.  Once adequate anesthesia, preoperative antibiotics administered, 1 g of vancomycin and 2 g of Ancef as well as tranexamic acid.  He was positioned supine.  A right thigh  tourniquet was placed.  The right lower extremity was then prepped and draped in sterile fashion.  Timeout was performed identifying the patient, the planned procedure and extremity.  Leg was exsanguinated, tourniquet elevated to 250 mmHg.  His old  incision was excised.  Soft tissue planes created.  Median arthrotomy was made encountering purulent joint fluid.  At this point, it was aspirated with a syringe and sent for pathology, evaluation, Gram stain, culture, aerobic and anaerobic.  This had  the appearance of a Staphylococcus aureus infection due to the appearance, speciation pending.  Once this was done, the further arthrotomy was carried out.  I performed an extensive excisional debridement of the medial suprapatellar and lateral synovium  around the patella and into the gutters.  Once this was carried out, we irrigated the knee with 3 L normal saline solution.  The polyethylene had been removed.  Further debridement was carried out in the  posterior aspect of the knee.  Following the 3 L  of normal saline solution, we combined a mixture of Betadine solution and hydrogen peroxide and then placed this into the knee.  It was approximately 300 mL.  It was kept in the wound for greater than 5 minutes.  It was then removed from the knee and the  knee was reirrigated with 3 L of normal saline solution to remove the Betadine from the knee.  Once this was carried out, we all exchanged gloves.  We removed all the previous retractors that were utilized for the primary debridement.  The knee was then  flexed and a new polyethylene and opened and placed into the knee.  The extensor mechanism was then reapproximated using a combination of antimicrobial #1 Vicryl and Stratafix suture.  The remainder of the wound was closed with 2-0 Vicryl and a running  Monocryl stitch.  Note that I did place about 750 mL of vancomycin powder into the deep wound and 250 mg into the superficial aspect of the wound at closure.  The knee was then cleaned, dried and dressed sterilely using surgical glue and Aquacel  dressing.  The patient was then returned to the recovery room in stable condition, tolerating the procedure well.  Postoperatively, he will get a PICC line order.  Infectious disease will be consulted to follow labs and cultures and transition  antibiotics appropriately.  We will see him back in the office in 2 weeks.  He will be in the hospital during that time.  TN/NUANCE  D:09/10/2018 T:09/10/2018 JOB:[id number]

## 2018-09-10 NOTE — Brief Op Note (Signed)
09/10/2018  10:36 AM  PATIENT:  [name].  58 y.o. male  PRE-OPERATIVE DIAGNOSIS:  Infected right total knee arthorplasty  POST-OPERATIVE DIAGNOSIS:  Infected right total knee arthorplasty  PROCEDURE:  Procedure(s): Right Knee Arthroplasty IRRIGATION AND DEBRIDEMENT KNEE WITH POLY EXCHANGE (Right)  SURGEON:  Surgeon(s) and Role:    [name], MD - Primary  PHYSICIAN ASSISTANT: Danae Orleans, PA-C  ANESTHESIA:   regional and spinal  EBL:  <100 cc  BLOOD ADMINISTERED:none  DRAINS: none   LOCAL MEDICATIONS USED:  NONE  SPECIMEN:  Source of Specimen:  right knee synovial fluid  DISPOSITION OF SPECIMEN:  PATHOLOGY  COUNTS:  YES  TOURNIQUET:  46 min at 250 mmHg  DICTATION: .Other Dictation: Dictation Number [id number]  PLAN OF CARE: Admit to inpatient   PATIENT DISPOSITION:  PACU - hemodynamically stable.   Delay start of Pharmacological VTE agent (>24hrs) due to surgical blood loss or risk of bleeding: no

## 2018-09-10 NOTE — Anesthesia Postprocedure Evaluation (Signed)
Anesthesia Post Note  Patient: [name].  Procedure(s) Performed: Right Knee Arthroplasty IRRIGATION AND DEBRIDEMENT KNEE WITH POLY EXCHANGE (Right Knee)     Patient location during evaluation: PACU Anesthesia Type: Regional and Spinal Level of consciousness: oriented and awake and alert Pain management: pain level controlled Vital Signs Assessment: post-procedure vital signs reviewed and stable Respiratory status: spontaneous breathing, respiratory function stable and patient connected to nasal cannula oxygen Cardiovascular status: blood pressure returned to baseline and stable Postop Assessment: no headache, no backache, no apparent nausea or vomiting and spinal receding Anesthetic complications: no    Last Vitals:  Vitals:   09/10/18 1401 09/10/18 1512  BP: (!) 150/97 (!) 131/94  Pulse: (!) 50 76  Resp: 18 16  Temp: 36.9 C 36.7 C  SpO2: 100% 100%    Last Pain:  Vitals:   09/10/18 1512  TempSrc: Oral  PainSc:                  Ryan P Ellender

## 2018-09-10 NOTE — Transfer of Care (Signed)
Immediate Anesthesia Transfer of Care Note  Patient: [name].  Procedure(s) Performed: Right Knee Arthroplasty IRRIGATION AND DEBRIDEMENT KNEE WITH POLY EXCHANGE (Right Knee)  Patient Location: PACU  Anesthesia Type:MAC and Spinal  Level of Consciousness: awake, alert  and oriented  Airway & Oxygen Therapy: Patient Spontanous Breathing and Patient connected to face mask oxygen  Post-op Assessment: Report given to RN and Post -op Vital signs reviewed and stable  Post vital signs: Reviewed and stable  Last Vitals:  Vitals Value Taken Time  BP 118/76 09/10/2018 12:18 PM  Temp    Pulse 85 09/10/2018 12:21 PM  Resp 16 09/10/2018 12:21 PM  SpO2 100 % 09/10/2018 12:21 PM  Vitals shown include unvalidated device data.  Last Pain:  Vitals:   09/10/18 1010  TempSrc:   PainSc: 0-No pain         Complications: No apparent anesthesia complications

## 2018-09-10 NOTE — Anesthesia Procedure Notes (Signed)
Anesthesia Regional Block: Adductor canal block   Pre-Anesthetic Checklist: ,, timeout performed, Correct Patient, Correct Site, Correct Laterality, Correct Procedure,, site marked, risks and benefits discussed, Surgical consent,  Pre-op evaluation,  At surgeon's request and post-op pain management  Laterality: Right  Prep: chloraprep       Needles:  Injection technique: Single-shot  Needle Type: Echogenic Stimulator Needle     Needle Length: 9cm  Needle Gauge: 21     Additional Needles:   Procedures:,,,, ultrasound used (permanent image in chart),,,,  Narrative:  Start time: 09/10/2018 9:55 AM End time: 09/10/2018 10:05 AM Injection made incrementally with aspirations every 5 mL.  Performed by: Personally  Anesthesiologist: Murvin Natal, MD  Additional Notes: Functioning IV was confirmed and monitors were applied. A time-out was performed. Hand hygiene and sterile gloves were used. The thigh was placed in a frog-leg position and prepped in a sterile fashion. A 73mm 21ga Arrow echogenic stimulator needle was placed using ultrasound guidance.  Negative aspiration and negative test dose prior to incremental administration of local anesthetic. The patient tolerated the procedure well.

## 2018-09-10 NOTE — Progress Notes (Signed)
RT inspected Pt's home CPAP machine for damages/defects, none were found.  Pt's machine is working properly at this time.  RT to monitor and assess as needed.

## 2018-09-11 ENCOUNTER — Inpatient Hospital Stay (HOSPITAL_COMMUNITY): Payer: 59

## 2018-09-11 ENCOUNTER — Inpatient Hospital Stay: Payer: Self-pay

## 2018-09-11 LAB — CBC
HCT: 40.6 % (ref 39.0–52.0)
Hemoglobin: 13.9 g/dL (ref 13.0–17.0)
MCH: 29.6 pg (ref 26.0–34.0)
MCHC: 34.2 g/dL (ref 30.0–36.0)
MCV: 86.6 fL (ref 80.0–100.0)
Platelets: 204 10*3/uL (ref 150–400)
RBC: 4.69 MIL/uL (ref 4.22–5.81)
RDW: 13.6 % (ref 11.5–15.5)
WBC: 7.6 10*3/uL (ref 4.0–10.5)
nRBC: 0 % (ref 0.0–0.2)

## 2018-09-11 LAB — BASIC METABOLIC PANEL
Anion gap: 6 (ref 5–15)
BUN: 19 mg/dL (ref 6–20)
CO2: 26 mmol/L (ref 22–32)
Calcium: 8.3 mg/dL — ABNORMAL LOW (ref 8.9–10.3)
Chloride: 103 mmol/L (ref 98–111)
Creatinine, Ser: 0.8 mg/dL (ref 0.61–1.24)
GFR calc Af Amer: >60 mL/min (ref >60)
GFR calc non Af Amer: >60 mL/min (ref >60)
Glucose, Bld: 123 mg/dL — ABNORMAL HIGH (ref 70–99)
Potassium: 4.2 mmol/L (ref 3.5–5.1)
Sodium: 135 mmol/L (ref 135–145)

## 2018-09-11 LAB — GLUCOSE, CAPILLARY
Glucose-Capillary: 140 mg/dL — ABNORMAL HIGH (ref 70–99)
Glucose-Capillary: 210 mg/dL — ABNORMAL HIGH (ref 70–99)
Glucose-Capillary: 230 mg/dL — ABNORMAL HIGH (ref 70–99)
Glucose-Capillary: 162 mg/dL — ABNORMAL HIGH (ref 70–99)

## 2018-09-11 MED ORDER — SODIUM CHLORIDE 0.9% FLUSH: 10.0000 mL | Freq: Two times a day (BID) | INTRAVENOUS | Status: DC

## 2018-09-11 MED ORDER — HYDROCODONE-ACETAMINOPHEN 7.5-325 MG PO TABS
1.0000 | ORAL_TABLET | Freq: Four times a day (QID) | ORAL | Status: DC | PRN
  Administered 2018-09-11: 2 via ORAL
  Filled 2018-09-11: qty 2

## 2018-09-11 MED ORDER — VANCOMYCIN HCL 10 G IV SOLR
1750.0000 mg | Freq: Once | INTRAVENOUS | Status: AC
  Administered 2018-09-11: 1750 mg via INTRAVENOUS
  Filled 2018-09-11: qty 1750

## 2018-09-11 MED ORDER — VANCOMYCIN HCL 10 G IV SOLR
1250.0000 mg | Freq: Two times a day (BID) | INTRAVENOUS | Status: DC
  Administered 2018-09-12: 1250 mg via INTRAVENOUS
  Filled 2018-09-11: qty 1250

## 2018-09-11 MED ORDER — HYDROCODONE-ACETAMINOPHEN 7.5-325 MG PO TABS
1.0000 | ORAL_TABLET | ORAL | Status: DC | PRN
  Administered 2018-09-11 – 2018-09-12 (×4): 2 via ORAL
  Filled 2018-09-11 (×3): qty 2

## 2018-09-11 MED ORDER — SODIUM CHLORIDE 0.9% FLUSH: 10.0000 mL | INTRAVENOUS | Status: DC | PRN

## 2018-09-11 NOTE — Progress Notes (Signed)
Patient ID: [name]., male   DOB: 11/13/1960, 58 y.o.   MRN: [id number]          [facility] for Infectious Disease    Date of Admission:  09/10/2018   Total days of antibiotics 6        Day 2 vancomycin and ceftriaxone  [name] is feeling better today and his pain is under better control.  He has been able to work with physical therapy.  Operative Gram stain and cultures here are negative at 24 hours.  We will need to review outpatient culture results from Dr. Aurea Graff office tomorrow.  I have gone ahead and ordered a PICC in anticipation of 6 weeks of IV antibiotic therapy.  I will continue vancomycin and ceftriaxone pending final culture results.  He is hopeful for discharge home tomorrow.  Diagnosis: Right prosthetic knee infection  Culture Result: Culture negative so far  Allergies  Allergen Reactions  . Morphine And Related Other (See Comments)    Hallucinations    OPAT Orders Discharge antibiotics: Per pharmacy protocol vancomycin and ceftriaxone Aim for Vancomycin trough 15-20 (unless otherwise indicated) Duration: 6 weeks End Date: 10/22/2018  [facility] Care Per Protocol:  Labs weekly while on IV antibiotics: _x_ CBC with differential _x_ BMP __ CMP _x_ CRP _x_ ESR _x_ Vancomycin trough __ CK  _x_ Please pull PIC at completion of IV antibiotics __ Please leave PIC in place until doctor has seen patient or been notified  Fax weekly labs to [phone]  Clinic Follow Up Appt: 10/10/2018 with me  Michel Bickers, [place] [facility] [place] [phone] pager   [phone] cell 09/11/2018, 12:50 PM

## 2018-09-11 NOTE — Progress Notes (Signed)
Checked on patient, cpap in place, resting comfortably with eyes closed, door wedged open with garbage can per patient request, will continue to monitor.

## 2018-09-11 NOTE — Progress Notes (Signed)
Patient ID: [name], male   DOB: November 06, 1960, 58 y.o.   MRN: [id number] Subjective: 1 Day Post-Op Procedure(s) (LRB): Right Knee Arthroplasty IRRIGATION AND DEBRIDEMENT KNEE WITH POLY EXCHANGE (Right)    Patient reports pain as moderate.  Objective:   VITALS:   Vitals:   09/11/18 0258 09/11/18 0613  BP: (!) 145/93 (!) 151/90  Pulse: 97 73  Resp: 18 15  Temp: 98.9 F (37.2 C) 98.7 F (37.1 C)  SpO2: 95% 97%    Neurovascular intact Incision: dressing C/D/I - right LE  LABS Recent Labs    09/11/18 0321  HGB 13.9  HCT 40.6  WBC 7.6  PLT 204    Recent Labs    09/10/18 0928 09/11/18 0321  NA 135 135  K 3.9 4.2  BUN 21* 19  CREATININE 0.92 0.80  GLUCOSE 151* 123*    Recent Labs    09/10/18 0928  INR 1.11     Assessment/Plan: 1 Day Post-Op Procedure(s) (LRB): Right Knee Arthroplasty IRRIGATION AND DEBRIDEMENT KNEE WITH POLY EXCHANGE (Right)   Advance diet Up with therapy   Awaiting cultures - continue current IV ABx per ID Discussed pain med - he feels hydrocodone has been more effective than oxycodone in past, changed Discussed OR findings, plans Would like to go home ASAP - understandable

## 2018-09-11 NOTE — Progress Notes (Signed)
Peripherally Inserted Central Catheter/Midline Placement  The IV Nurse has discussed with the patient and/or persons authorized to consent for the patient, the purpose of this procedure and the potential benefits and risks involved with this procedure.  The benefits include less needle sticks, lab draws from the catheter, and the patient may be discharged home with the catheter. Risks include, but not limited to, infection, bleeding, blood clot (thrombus formation), and puncture of an artery; nerve damage and irregular heartbeat and possibility to perform a PICC exchange if needed/ordered by physician.  Alternatives to this procedure were also discussed.  Bard Power PICC patient education guide, fact sheet on infection prevention and patient information card has been provided to patient /or left at bedside.    PICC/Midline Placement Documentation  PICC Single Lumen 96/04/54 PICC Right Basilic 40 cm 0 cm (Active)  Indication for Insertion or Continuance of Line Home intravenous therapies (PICC only) 09/11/2018  6:00 PM  Exposed Catheter (cm) 0 cm 09/11/2018  6:00 PM  Site Assessment Clean;Dry;Intact 09/11/2018  6:00 PM  Line Status Flushed;Blood return noted;Saline locked 09/11/2018  6:00 PM  Dressing Type Transparent 09/11/2018  6:00 PM  Dressing Status Clean;Dry;Intact;Antimicrobial disc in place 09/11/2018  6:00 PM  Line Care Connections checked and tightened 09/11/2018  6:00 PM  Dressing Intervention New dressing 09/11/2018  6:00 PM  Dressing Change Due 09/18/18 09/11/2018  6:00 PM       [name] 09/11/2018, 6:31 PM

## 2018-09-11 NOTE — Progress Notes (Signed)
Spoke with Anderson Malta RN re PICC line.  Will await culture results per Dr Hale Bogus note prior to placing PICC.

## 2018-09-11 NOTE — Progress Notes (Signed)
Pharmacy Antibiotic Note  [name]. is a 58 y.o. male admitted on 09/10/2018 with prosthetic knee infection s/p irrigation and debridement. ID plans for 6 weeks of IV abx. Currently ordered CTX and vancomycin pending culture results.   Pharmacy has been consulted for vancomycin dosing and OPAT.  Plan: CTX 2 g IV q24h ordered per MD.   Vancomycin 1750 mg iv once followed by 1250 mg iv q12h. AUC 441 IBW/ABW.   F/U renal function and culture results    Height: 5\' 10"  (177.8 cm) Weight: 243 lb 2 oz (110.3 kg) IBW/kg (Calculated) : 73  Temp (24hrs), Avg:98.3 F (36.8 C), Min:97.4 F (36.3 C), Max:98.9 F (37.2 C)  Recent Labs  Lab 09/05/18 1150 09/10/18 0928 09/11/18 0321  WBC 11.6*  --  7.6  CREATININE  --  0.92 0.80    Estimated Creatinine Clearance: 123.6 mL/min (by C-G formula based on SCr of 0.8 mg/dL).    Allergies  Allergen Reactions  . Morphine And Related Other (See Comments)    Hallucinations    Antimicrobials this admission: 1/11 CTX >>  vancomycin 1/12 >>   Dose adjustments this admission:  Microbiology results: 1/11 synovial fluid: pending  OUTPATIENT  PARENTERAL ANTIBIOTIC THERAPY (OPAT)  Indication: prosthetic joint infection Regimen: Ceftriaxone 2 g iv q 24 hours, vancomycin 1250 mg iv q 12 hours End date: 10/22/18  IV antibiotic discharge orders are pended. To discharging provider:  please sign these orders via discharge navigator,  Select New Orders & click on the button choice - Manage This Unsigned Work.  Thank you for allowing pharmacy to be a part of this patient's care.  Ulice Dash D 09/11/2018 1:13 PM

## 2018-09-11 NOTE — Plan of Care (Signed)
  Problem: Education: Goal: Required Educational Video(s) Outcome: Progressing

## 2018-09-11 NOTE — Evaluation (Signed)
Physical Therapy Evaluation Patient Details Name: [name]. MRN: [id number] DOB: [birth year] Today's Date: 09/11/2018   History of Present Illness  Pt is a 58 YO male s/p R knee I and D and poly exchange on 09/10/18.  R TKR on 05/31/18, L TKA 06/28/18. PMH includes tubular adenoma of colon 2019, BCE, DM II with neuropathy, permanant afib, polycythemia vera 2016, HLD, HTN TIA, GERD, obesity, OSA, cardioversion, L THA 2012.  Clinical Impression  Patient evaluated by Physical Therapy with no further acute PT needs identified. All education has been completed and the patient has no further questions.  Pt doing well, able to return demo mobility, HEP, stairs etc; no further needs this venue; pt is ok to amb with RW and family or staff;  See below for any follow-up Physical Therapy or equipment needs. PT is signing off. Thank you for this referral.     Follow Up Recommendations Outpatient PT    Equipment Recommendations  None recommended by PT    Recommendations for Other Services       Precautions / Restrictions Precautions Precautions: Fall;Knee Precaution Comments: reviewed no pillow under knee, kee of bed locked out; pt states he is going to undo this when PT leaves Restrictions Weight Bearing Restrictions: No Other Position/Activity Restrictions: WBAT      Mobility  Bed Mobility Overal bed mobility: Modified Independent             General bed mobility comments: using gait belt as leg lifter  Transfers Overall transfer level: Modified independent Equipment used: None;Straight cane;Rolling walker (2 wheeled)                Ambulation/Gait Ambulation/Gait assistance: Supervision Gait Distance (Feet): 200 Feet Assistive device: Rolling walker (2 wheeled);None;Straight cane Gait Pattern/deviations: Step-to pattern;Step-through pattern;Decreased stride length     General Gait Details: cues for heel strike, step length, terminal knee  extension  Stairs Stairs: Yes Stairs assistance: Supervision;Min guard Stair Management: One rail Right;One rail Left;Step to pattern;Sideways;Backwards;Forwards Number of Stairs: 6 General stair comments: pt demo's mutliple stair technqiues, he reports he sometimes "bear crawls" up the stairs at home  Wheelchair Mobility    Modified Rankin (Stroke Patients Only)       Balance                                             Pertinent Vitals/Pain Pain Assessment: 0-10 Pain Score: 3  Pain Location: L knee Pain Descriptors / Indicators: Discomfort Pain Intervention(s): Monitored during session;Ice applied    Home Living Family/patient expects to be discharged to:: Private residence Living Arrangements: Spouse/significant other Available Help at Discharge: Family Type of Home: House Home Access: Stairs to enter   Technical brewer of Steps: 5+1 Home Layout: Able to live on main level with bedroom/bathroom;Two level Home Equipment: (P) Crutches;Walker - 2 wheels;Walker - 4 wheels;Bedside commode;Grab bars - toilet      Prior Function Level of Independence: (P) Independent         Comments: still going to OPPT at [facility]     Hand Dominance   Dominant Hand: (P) Right    Extremity/Trunk Assessment   Upper Extremity Assessment Upper Extremity Assessment: Overall WFL for tasks assessed    Lower Extremity Assessment Lower Extremity Assessment: RLE deficits/detail;LLE deficits/detail RLE Deficits / Details: knee extension 3+/5, limitations end knee range extension; ankle WFL;  hip flexion grossly 3 to 3+/5 LLE Deficits / Details: grossly WFL; pt reports still working on quad strengthening at [employer] prior to admission       Communication   Communication: No difficulties  Cognition Arousal/Alertness: Awake/alert Behavior During Therapy: WFL for tasks assessed/performed Overall Cognitive Status: Within Functional Limits for tasks assessed                                         General Comments      Exercises Total Joint Exercises Ankle Circles/Pumps: AROM;Both;10 reps Quad Sets: Both;AROM;5 reps Heel Slides: AROM;5 reps;AAROM;Right Straight Leg Raises: AROM;AAROM;5 reps;Right   Assessment/Plan    PT Assessment All further PT needs can be met in the next venue of care  PT Problem List         PT Treatment Interventions      PT Goals (Current goals can be found in the Care Plan section)  Acute Rehab PT Goals PT Goal Formulation: All assessment and education complete, DC therapy    Frequency     Barriers to discharge        Co-evaluation               AM-PAC PT "6 Clicks" Mobility  Outcome Measure Help needed turning from your back to your side while in a flat bed without using bedrails?: None Help needed moving from lying on your back to sitting on the side of a flat bed without using bedrails?: None Help needed moving to and from a bed to a chair (including a wheelchair)?: None Help needed standing up from a chair using your arms (e.g., wheelchair or bedside chair)?: None Help needed to walk in hospital room?: A Little Help needed climbing 3-5 steps with a railing? : A Little 6 Click Score: 22    End of Session Equipment Utilized During Treatment: Gait belt Activity Tolerance: Patient tolerated treatment well Patient left: with call bell/phone within reach;in bed;with family/visitor present Nurse Communication: Mobility status PT Visit Diagnosis: Difficulty in walking, not elsewhere classified (R26.2)    Time: 7972-8206 PT Time Calculation (min) (ACUTE ONLY): 32 min   Charges:   PT Evaluation $PT Eval Low Complexity: 1 Low PT Treatments $Gait Training: 8-22 mins        Kenyon Ana, PT  Pager: [phone] Acute Rehab Dept [facility]): [phone]   09/11/2018   [facility] 09/11/2018, 11:13 AM

## 2018-09-12 ENCOUNTER — Telehealth: Payer: Self-pay | Admitting: Cardiovascular Disease

## 2018-09-12 DIAGNOSIS — M150 Primary generalized (osteo)arthritis: Secondary | ICD-10-CM | POA: Diagnosis not present

## 2018-09-12 DIAGNOSIS — T8453XA Infection and inflammatory reaction due to internal right knee prosthesis, initial encounter: Secondary | ICD-10-CM | POA: Diagnosis not present

## 2018-09-12 DIAGNOSIS — E8881 Metabolic syndrome: Secondary | ICD-10-CM | POA: Diagnosis not present

## 2018-09-12 DIAGNOSIS — L03119 Cellulitis of unspecified part of limb: Secondary | ICD-10-CM | POA: Diagnosis not present

## 2018-09-12 DIAGNOSIS — K219 Gastro-esophageal reflux disease without esophagitis: Secondary | ICD-10-CM | POA: Diagnosis not present

## 2018-09-12 DIAGNOSIS — Z96642 Presence of left artificial hip joint: Secondary | ICD-10-CM | POA: Diagnosis not present

## 2018-09-12 DIAGNOSIS — Z96652 Presence of left artificial knee joint: Secondary | ICD-10-CM | POA: Diagnosis not present

## 2018-09-12 LAB — BASIC METABOLIC PANEL
Anion gap: 8 (ref 5–15)
BUN: 15 mg/dL (ref 6–20)
CO2: 24 mmol/L (ref 22–32)
Calcium: 8.6 mg/dL — ABNORMAL LOW (ref 8.9–10.3)
Chloride: 104 mmol/L (ref 98–111)
Creatinine, Ser: 0.71 mg/dL (ref 0.61–1.24)
GFR calc Af Amer: >60 mL/min (ref >60)
GLUCOSE: 186 mg/dL — AB (ref 70–99)
Potassium: 4.1 mmol/L (ref 3.5–5.1)
Sodium: 136 mmol/L (ref 135–145)

## 2018-09-12 LAB — CBC
HCT: 39.7 % (ref 39.0–52.0)
Hemoglobin: 13.6 g/dL (ref 13.0–17.0)
MCH: 29.9 pg (ref 26.0–34.0)
MCHC: 34.3 g/dL (ref 30.0–36.0)
MCV: 87.3 fL (ref 80.0–100.0)
Platelets: 217 10*3/uL (ref 150–400)
RBC: 4.55 MIL/uL (ref 4.22–5.81)
RDW: 13.3 % (ref 11.5–15.5)
WBC: 6.1 10*3/uL (ref 4.0–10.5)
nRBC: 0 % (ref 0.0–0.2)

## 2018-09-12 LAB — GLUCOSE, CAPILLARY
GLUCOSE-CAPILLARY: 135 mg/dL — AB (ref 70–99)
Glucose-Capillary: 108 mg/dL — ABNORMAL HIGH (ref 70–99)

## 2018-09-12 MED ORDER — CEFAZOLIN IV (FOR PTA / DISCHARGE USE ONLY): 2.0000 g | Freq: Three times a day (TID) | INTRAVENOUS | 0 refills | Status: DC

## 2018-09-12 MED ORDER — MUPIROCIN 2 % EX OINT
1.0000 "application " | TOPICAL_OINTMENT | Freq: Two times a day (BID) | CUTANEOUS | Status: DC
  Administered 2018-09-12: 1 via NASAL
  Filled 2018-09-12: qty 22

## 2018-09-12 MED ORDER — HYDROCODONE-ACETAMINOPHEN 7.5-325 MG PO TABS: 1.0000 | ORAL_TABLET | ORAL | 0 refills | Status: DC | PRN

## 2018-09-12 MED ORDER — CEFAZOLIN SODIUM-DEXTROSE 2-4 GM/100ML-% IV SOLN
2.0000 g | Freq: Three times a day (TID) | INTRAVENOUS | Status: DC
  Administered 2018-09-12: 2 g via INTRAVENOUS
  Filled 2018-09-12: qty 100

## 2018-09-12 MED ORDER — POLYETHYLENE GLYCOL 3350 17 G PO PACK: 17.0000 g | PACK | Freq: Two times a day (BID) | ORAL | 0 refills | Status: DC

## 2018-09-12 MED ORDER — DOCUSATE SODIUM 100 MG PO CAPS: 100.0000 mg | ORAL_CAPSULE | Freq: Two times a day (BID) | ORAL | 0 refills | Status: DC

## 2018-09-12 MED ORDER — HYDRALAZINE HCL 20 MG/ML IJ SOLN
10.0000 mg | Freq: Once | INTRAMUSCULAR | Status: AC
  Administered 2018-09-12: 10 mg via INTRAVENOUS
  Filled 2018-09-12: qty 1

## 2018-09-12 MED ORDER — CEFAZOLIN SODIUM-DEXTROSE 2-4 GM/100ML-% IV SOLN: 2.0000 g | Freq: Three times a day (TID) | INTRAVENOUS | 0 refills | Status: DC

## 2018-09-12 MED ORDER — FERROUS SULFATE 325 (65 FE) MG PO TABS: 325.0000 mg | ORAL_TABLET | Freq: Three times a day (TID) | ORAL | 3 refills | Status: DC

## 2018-09-12 MED ORDER — HYDRALAZINE HCL 20 MG/ML IJ SOLN
10.0000 mg | INTRAMUSCULAR | Status: DC | PRN
  Administered 2018-09-12: 10 mg via INTRAVENOUS
  Filled 2018-09-12: qty 1

## 2018-09-12 MED ORDER — METHOCARBAMOL 500 MG PO TABS: 500.0000 mg | ORAL_TABLET | Freq: Four times a day (QID) | ORAL | 0 refills | Status: DC | PRN

## 2018-09-12 MED ORDER — VANCOMYCIN IV (FOR PTA / DISCHARGE USE ONLY): 1250.0000 mg | Freq: Two times a day (BID) | INTRAVENOUS | 0 refills | Status: DC

## 2018-09-12 MED ORDER — CEFTRIAXONE IV (FOR PTA / DISCHARGE USE ONLY): 2.0000 g | INTRAVENOUS | 0 refills | Status: DC

## 2018-09-12 MED ORDER — HEPARIN SOD (PORK) LOCK FLUSH 100 UNIT/ML IV SOLN
250.0000 [IU] | INTRAVENOUS | Status: AC | PRN
  Administered 2018-09-12: 250 [IU]

## 2018-09-12 MED ORDER — SACUBITRIL-VALSARTAN 24-26 MG PO TABS
1.0000 | ORAL_TABLET | Freq: Two times a day (BID) | ORAL | Status: DC
  Administered 2018-09-12: 1 via ORAL
  Filled 2018-09-12: qty 1

## 2018-09-12 MED FILL — HYDROCODON-APAP 7.5-325: 7.5-325 | 5 days supply | Qty: 60 | Fill #0

## 2018-09-12 MED FILL — METHOCARBAMOL 500 MG TABS: 500 | 10 days supply | Qty: 40 | Fill #0

## 2018-09-12 NOTE — Progress Notes (Signed)
[facility] Coordinator will support home infusion pharmacy services for home IVABX at DC to support ID team.  [facility] pharmacy team will partner with patient's [facility] agency of choice who will provide [facility] services.  [facility] is prepared for DC home today if ordered.  If patient discharges after hours, please call [phone].   Larry Sierras 09/12/2018, 7:47 AM

## 2018-09-12 NOTE — Addendum Note (Signed)
Addendum  created 09/12/18 1969 by Lollie Sails, CRNA   Charge Capture section accepted, Visit diagnoses modified

## 2018-09-12 NOTE — Telephone Encounter (Signed)
Valetta Fuller, Would you be able to call [name] and see what they want to start I've sent Jinny Blossom this message also  If he needs to switch to warfarin, he will need to come in for a coumadin appt.

## 2018-09-12 NOTE — Progress Notes (Signed)
The pt and pt's Wife were provided with d/c instructions and prescriptions. After discussing the pt's plan of care upon d/c home, the pt and pt's Wife reported no further questions or concerns.

## 2018-09-12 NOTE — Care Management Note (Signed)
Case Management Note  Patient Details  Name: [name]. MRN: [id number] Date of Birth: 30-Apr-1961  Subjective/Objective:     Discharge planning, spoke with patient and spouse at beside. Chose [facility] for [facility] services, nurse for IV abx.                 Action/Plan: Contacted [facility] for referral. They have accepted. Has RW and 3-n-1. [phone]  Expected Discharge Date:  09/12/18               Expected Discharge Plan:  [place]  In-House Referral:  NA  Discharge planning Services  CM Consult  Post Acute Care Choice:  Home Health Choice offered to:  Patient, Spouse  DME Arranged:  N/A DME Agency:  NA  HH Arranged:  RN, Disease Management HH Agency:  NA  Status of Service:  Completed, signed off  If discussed at [place] of Stay Meetings, dates discussed:    Additional Comments:  Guadalupe Maple, RN 09/12/2018, 9:45 AM

## 2018-09-12 NOTE — Progress Notes (Addendum)
At 0739, Adrian Prince, PA was notified regarding the pt's BP of 182/110.  Babish, PA ordered PRN Hydralazine 10 mg IV Q-4.  At 1009, Babish, PA was notified of the pt's BP of 189/113.  Babish gave verbal orders for an additional dose of Hydralazine 10 mg IV once.   I will continue to monitor the pt.  The pt's BP came down to 151/86. Babish, PA was notified.

## 2018-09-12 NOTE — Telephone Encounter (Signed)
Spoke with the patient's wife in great detail.  [name] had double knee replacements recently and was discharged from [facility] today for R knee infection. Dr. Alvan Dame needs to start Rifampin in conjunction with IV Ancef q8h. There is an interaction between Rifampin and Xarelto. Dr. Alvan Dame requests Cardiology change Xarelto to a different anticoagulant and inform him once done so Dr. Alvan Dame can call in Rifampin at correct dose.  Per Dr. Acie Fredrickson, the patient will need to be changed to Coumadin for the 6 week duration of the antibiotics.  Spoke with pharmD, who will call the patient tomorrow and discuss anticoagulation. The patient's wife requests a call at [phone] to discuss tomorrow. Her name is [name].  Dr. Alvan Dame will need to be notified as well. Phone# [phone].

## 2018-09-12 NOTE — Telephone Encounter (Signed)
I agree with coumadin

## 2018-09-12 NOTE — Progress Notes (Signed)
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 Morphine And Related Other (See Comments)    Hallucinations    OPAT Orders Discharge antibiotics: Per pharmacy protocol  cefazolin  Duration: 6 wk End Date: Feb 24th  [place] Per Protocol:  Labs weekly while on IV antibiotics: __xx CBC with differential _x BMP _x_ CRP _x_ ESR  _x_ Please pull PIC at completion of IV antibiotics   Fax weekly labs to [phone]  Clinic Follow Up Appt: 4-6 wk with dr [name]  @

## 2018-09-12 NOTE — Plan of Care (Signed)
  Problem: Education: Goal: Required Educational Video(s) Outcome: Progressing

## 2018-09-12 NOTE — Telephone Encounter (Signed)
New Message   Pt c/o medication issue:  1. Name of Medication: Xarelto  2. How are you currently taking this medication (dosage and times per day)? 20mg   3. Are you having a reaction (difficulty breathing--STAT)? no  4. What is your medication issue? The ortho Dr Alvan Dame has requested pt stop taking Sheila Oats because it is a conflict to an antibiotic the pt needs to be started on. They would like to switch to Eliquis or something similar   Please call

## 2018-09-12 NOTE — Telephone Encounter (Signed)
We need more information Depending on the antibiotic - ( ie Rifampin) , all the DOACS are contraindicated and he will need to switch to warfarin

## 2018-09-12 NOTE — Progress Notes (Signed)
     Subjective: 2 Days Post-Op Procedure(s) (LRB): Right Knee Arthroplasty IRRIGATION AND DEBRIDEMENT KNEE WITH POLY EXCHANGE (Right)   Patient states that his pain is mild and controlled with medications.  Patient is ready to get home and feels that he is doing well with PT. Has met with ID and understands the antibiotic use.  Experiencing some HTN, but feels good.  Ready to be discharged home.    Objective:   VITALS:   Vitals:   09/12/18 0605 09/12/18 0730  BP: (!) 167/122 (!) 182/110  Pulse: (!) 108 77  Resp: 16   Temp: 97.7 F (36.5 C)   SpO2: 100%     Dorsiflexion/Plantar flexion intact Incision: dressing C/D/I No cellulitis present Compartment soft  LABS Recent Labs    09/11/18 0321 09/12/18 0354  HGB 13.9 13.6  HCT 40.6 39.7  WBC 7.6 6.1  PLT 204 217    Recent Labs    09/10/18 0928 09/11/18 0321 09/12/18 0354  NA 135 135 136  K 3.9 4.2 4.1  BUN 21* 19 15  CREATININE 0.92 0.80 0.71  GLUCOSE 151* 123* 186*     Assessment/Plan: 2 Days Post-Op Procedure(s) (LRB): Right Knee Arthroplasty IRRIGATION AND DEBRIDEMENT KNEE WITH POLY EXCHANGE (Right) ID recommendations and f/u per their note Up with therapy Discharge home with home health for antibiotic use Follow up in 2 weeks at [facility] Follow up with OLIN,Justin Meisenheimer D in 2 weeks.  Contact information:  [facility]) [address] [place] [phone]    HTN Restarted home meds Given one dose of Hydralazine Will observe and told him to monitor at home and report to cardiologist    [place]. Haevyn Ury   PAC  09/12/2018, 7:46 AM

## 2018-09-13 ENCOUNTER — Other Ambulatory Visit: Payer: Self-pay | Admitting: *Deleted

## 2018-09-13 ENCOUNTER — Encounter (HOSPITAL_COMMUNITY): Payer: Self-pay | Admitting: Orthopedic Surgery

## 2018-09-13 ENCOUNTER — Other Ambulatory Visit: Payer: Self-pay | Admitting: Internal Medicine

## 2018-09-13 DIAGNOSIS — T8459XD Infection and inflammatory reaction due to other internal joint prosthesis, subsequent encounter: Secondary | ICD-10-CM

## 2018-09-13 DIAGNOSIS — T8453XA Infection and inflammatory reaction due to internal right knee prosthesis, initial encounter: Secondary | ICD-10-CM | POA: Diagnosis not present

## 2018-09-13 DIAGNOSIS — Z96642 Presence of left artificial hip joint: Secondary | ICD-10-CM | POA: Diagnosis not present

## 2018-09-13 DIAGNOSIS — E8881 Metabolic syndrome: Secondary | ICD-10-CM | POA: Diagnosis not present

## 2018-09-13 DIAGNOSIS — Z96652 Presence of left artificial knee joint: Secondary | ICD-10-CM | POA: Diagnosis not present

## 2018-09-13 DIAGNOSIS — K219 Gastro-esophageal reflux disease without esophagitis: Secondary | ICD-10-CM | POA: Diagnosis not present

## 2018-09-13 DIAGNOSIS — Z96659 Presence of unspecified artificial knee joint: Secondary | ICD-10-CM

## 2018-09-13 MED ORDER — RIFAMPIN 300 MG PO CAPS: 600.0000 mg | ORAL_CAPSULE | Freq: Every day | ORAL | 5 refills | Status: DC

## 2018-09-13 MED ORDER — ENOXAPARIN SODIUM 120 MG/0.8ML ~~LOC~~ SOLN: 120.0000 mg | Freq: Two times a day (BID) | SUBCUTANEOUS | 2 refills | Status: DC

## 2018-09-13 MED FILL — rifAMPin 300 MG CAPS: 300 | 30 days supply | Qty: 60 | Fill #0

## 2018-09-13 MED FILL — ENOXAPARIN 120 MG/0.8 ML SY: 120 | 30 days supply | Qty: 48 | Fill #0

## 2018-09-13 NOTE — Addendum Note (Signed)
Addended by: Sherlynn Tourville E on: 09/13/2018 11:45 AM   Modules accepted: Orders

## 2018-09-13 NOTE — Telephone Encounter (Signed)
[name] will need to be on antibiotic therapy for at least 6 months for his MSSA prosthetic knee infection.  He will be on IV cefazolin for the first 6 weeks then transitioning to oral cephalexin.  Ideally he will be on oral rifampin for the full 6 months as well.  Since his anticoagulation has been switched to enoxaparin injections I will add rifampin now.  He has trouble tolerating daily injections we may have to stop rifampin and treat him with cephalosporins alone.

## 2018-09-13 NOTE — Telephone Encounter (Signed)
Pt will require warfarin with Lovenox bridge until INR is therapeutic due to history of afib with TIA.  Since pt will be on rifampin for 6 weeks and it may take that long for his INR to become therapeutic due to interaction with rifampin, discussed using Lovenox monotherapy for the 6 week duration. He prefers to do this to avoid weekly INR checks on warfarin.  Pt weighs 111kg - will round up to nearest syringe size and dose Lovenox 1mg /kg BID = Lovenox 120mg  twice daily. Pt has previously used Lovenox shots and feels comfortable with injections. He is aware to take his first Lovenox shot at the next time he would be due for a Xarelto dose, and will start the Lovenox once he starts his rifampin. Will likely continue Lovenox for ~1 week after rifampin is stopped before switching back to Xarelto to ensure that rifampin has washed out of his system.  Rx sent to pharmacy - copay is [phone] for 1 month supply (20% copay of baseline price of drug). Spoke with pt and he is ok with this.  Relayed message to Dr Alvan Dame (ortho) and Dr Taren Dymek Salon (ID) to update them of plan.

## 2018-09-13 NOTE — Telephone Encounter (Signed)
Pt returned call and states he prefers to just use Lovenox injections for the 6 month duration that he is on rifampin.

## 2018-09-13 NOTE — Patient Outreach (Signed)
[facility]) Care Management  09/13/2018  [name]. [birth year] [id number]  Transition of care telephone call:  Subjective: Initial successful telephone call to patient's preferred number in order to complete transition of care assessment;  2 HIPAA identifiers verified. Explained purpose of call and completed transition of care assessment. [name] says he feels much better now that he's home and the fluid was drained from around his right knee. He says his fasting blood sugar this morning was 127. He says he has lost a significant amount ow weight since his knee surgeries in October due to the side effect of nausea from the prescribed pain medication.  He reports his most recent Hgb A1C as 5.4% (verified via electronic medical record of specimen drawn  on 09/10/18). He says he remains active with [facility], the [facility] for Type 2 diabetes self management assistance.  [name] states he and his wife [name] were able to give two Ancef infusions via his PICC line without difficulty after the home health RN taught them the procedure yesterday.  He says he is ambulating without the use of an assistive device.   Objective:  [name] was hospitalized at [facility] from 1/11-1/13/2020 for infection in his right knee. He had irrigation and drainage and debridement with poly exchange on 09/10/18. Right total knee arthroplasty was done 05/31/18.  Left total knee arthroplasty was done 06/28/18. Comorbidities include: HTN, atrial fibrillation, OSA, GERD, Type 2 DM, Hyperlipidemia, psoriatic arthritis, and obesity He was discharged to home on 09/12/18 without the need for additional DME and with the services of home health RN from [facility] to teach patient and family how to administer IV antibiotics.   Assessment: See transition of care flowsheet fort assessment details.  Plan:  At patient's request, will notify [place] of [name]'s surgery and his inability to meet his daily step goals in the [employer].  No other ongoing care management needs identified so will close case to [place] [facility] care management services and route successful outreach letter with [place] Management pamphlet and 24 hours Nurse Advice Line Magnet to [place] Management clinical pool to be mailed to patient's home address.   Barrington Ellison RN,CCM,CDE [place] Management Coordinator Office Phone [phone] Office Fax [phone]

## 2018-09-13 NOTE — Discharge Summary (Signed)
Physician Discharge Summary  Patient ID: [name]. MRN: [id number] DOB/AGE: 1960/09/26 58 y.o.  Admit date: 09/10/2018 Discharge date: 09/12/2018   Procedures:  Procedure(s) (LRB): Right Knee Arthroplasty IRRIGATION AND DEBRIDEMENT KNEE WITH POLY EXCHANGE (Right)  Attending Physician:  Dr. [name]   Admission Diagnoses:    Infected right TKA  Discharge Diagnoses:  Principal Problem:   Infected right TKA Active Problems:   S/P TKR (total knee replacement), right  Past Medical History:  Diagnosis Date  . Arthritis   . Diverticulosis   . Dyslipidemia   . GERD (gastroesophageal reflux disease)   . Herpes labialis   . Hypertension   . Longstanding persistent atrial fibrillation   . Metabolic syndrome   . Obesity   . Proteinuria   . Psoriasis   . Seborrheic dermatitis   . Sleep apnea    very compliant with CPAP  . TIA (transient ischemic attack) 2009    HPI:    Pt is a 58 y.o. male complaining of right knee pain and swelling. Pain had continually increased since the beginning. He is 3 months out from right TKA done 05/31/18, per Dr. Alvan Dame.  X-rays in the clinic show right TKA in good position and alignment.  Aspiration preformed in the clinic revealed infection. It was determined that he would benefit from a  I&D of the right TKA with poly exchange to try to treat the infection.  Various options are discussed with the patient. Risks, benefits and expectations were discussed with the patient. Patient understand the risks, benefits and expectations and wishes to proceed with surgery.  PCP: Denita Lung, MD   Discharged Condition: good  Hospital Course:  Patient underwent the above stated procedure on 09/10/2018. Patient tolerated the procedure well and brought to the recovery room in good condition and subsequently to the floor.  POD #1 BP: 151/90 ; Pulse: 73 ; Temp: 98.7 F (37.1 C) ; Resp: 15 Patient reports pain as moderate. Neurovascular intact and  incision: dressing C/D/I - right LE.  LABS  Basename    HGB     13.9  HCT     40.6   POD #2  BP: 151/86 ; Pulse: 82 ; Temp: 97.4 F (36.3 C) ; Resp: 16 Patient states that his pain is mild and controlled with medications.  Patient is ready to get home and feels that he is doing well with PT. Has met with ID and understands the antibiotic use.  Experiencing some HTN, but this improved throughout the day to more normal levels.  Ready to be discharged home. Dorsiflexion/plantar flexion intact, incision: dressing C/D/I, no cellulitis present and compartment soft.   LABS  Basename    HGB     13.6  HCT     39.7    Discharge Exam: General appearance: alert, cooperative and no distress Extremities: Homans sign is negative, no sign of DVT, no edema, redness or tenderness in the calves or thighs and no ulcers, gangrene or trophic changes  Disposition:  Home with follow up in 2 weeks   Follow-up Information    [name], MD. Schedule an appointment as soon as possible for a visit in 2 weeks.   Specialty:  Orthopedic Surgery Contact information: [address] [place] [address] [phone]        Michel Bickers, MD. Daphane Shepherd on 10/10/2018.   Specialty:  Infectious Diseases Contact information: [address] [place] [address] [phone]  Health, [facility] Follow up.   Specialty:  [place] Why:  nurse to assit with IV antibiotics and line care Contact information: [address] [place] [address] [phone]           Discharge Instructions    Call MD / Call 911   Complete by:  As directed    If you experience chest pain or shortness of breath, CALL 911 and be transported to the hospital emergency room.  If you develope a fever above 101 F, pus (white drainage) or increased drainage or redness at the wound, or calf pain, call your surgeon's office.   Change dressing   Complete by:  As  directed    Maintain surgical dressing until follow up in the clinic. If the edges start to pull up, may reinforce with tape. If the dressing is no longer working, may remove and cover with gauze and tape, but must keep the area dry and clean.  Call with any questions or concerns.   Constipation Prevention   Complete by:  As directed    Drink plenty of fluids.  Prune juice may be helpful.  You may use a stool softener, such as Colace (over the counter) 100 mg twice a day.  Use MiraLax (over the counter) for constipation as needed.   Diet - low sodium heart healthy   Complete by:  As directed    Discharge instructions   Complete by:  As directed    Maintain surgical dressing until follow up in the clinic. If the edges start to pull up, may reinforce with tape. If the dressing is no longer working, may remove and cover with gauze and tape, but must keep the area dry and clean.  Follow up in 2 weeks at [facility]. Call with any questions or concerns.   Home infusion instructions [facility] May follow [place] Dosing Protocol; May administer Cathflo as needed to maintain patency of vascular access device.; Flushing of vascular access device: per [facility] Protocol: 0.9% NaCl pre/post medica...   Complete by:  As directed    Instructions:  May follow [place] Dosing Protocol   Instructions:  May administer Cathflo as needed to maintain patency of vascular access device.   Instructions:  Flushing of vascular access device: per [facility] Protocol: 0.9% NaCl pre/post medication administration and prn patency; Heparin 100 u/ml, 67m for implanted ports and Heparin 10u/ml, 519mfor all other central venous catheters.   Instructions:  May follow [facility] Anaphylaxis Protocol for First Dose Administration in the home: 0.9% NaCl at 25-50 ml/hr to maintain IV access for protocol meds. Epinephrine 0.3 ml IV/IM PRN and Benadryl 25-50 IV/IM PRN s/s of anaphylaxis.   Instructions:  [facility] Coordinator (RN) to assist per patient IV care needs in the home PRN.   Home infusion instructions [facility] May follow [place] Protocol; May administer Cathflo as needed to maintain patency of vascular access device.; Flushing of vascular access device: [facility]: 0.9% NaCl pre/post medica...   Complete by:  As directed    Instructions:  May follow [place] Protocol   Instructions:  May administer Cathflo as needed to maintain patency of vascular access device.   Instructions:  Flushing of vascular access device: [facility]: 0.9% NaCl pre/post medication administration and prn patency; Heparin 100 u/ml, 12m64mor implanted ports and Heparin 10u/ml, 12ml51mr all other central venous catheters.   Instructions:  May follow [facility] Anaphylaxis Protocol for First Dose Administration in the home: 0.9% NaCl at 25-50 ml/hr to maintain IV access for protocol meds. Epinephrine 0.3 ml IV/IM PRN and Benadryl 25-50 IV/IM PRN s/s of anaphylaxis.   Instructions:  [place] [facility]fusion Coordinator (RN) to assist per patient IV care needs in the home PRN.   Increase activity slowly as tolerated   Complete by:  As directed    Weight bearing as tolerated with assist device (walker, cane, etc) as directed, use it as long as suggested by your surgeon or therapist, typically at least 4-6 weeks.   TED hose   Complete by:  As directed    Use stockings (TED hose) for 2 weeks on both leg(s).  You may remove them at night for sleeping.      Allergies as of 09/12/2018      Reactions   Morphine And Related Other (See Comments)   Hallucinations      Medication List    STOP taking these medications   enoxaparin 40 MG/0.4ML injection Commonly known as:  LOVENOX   HYDROcodone-acetaminophen 5-325 MG tablet Commonly known as:  NORCO/VICODIN Replaced by:  HYDROcodone-acetaminophen 7.5-325 MG tablet   KEFLEX 500 MG capsule Generic drug:  cephALEXin     TAKE these  medications   carvedilol 12.5 MG tablet Commonly known as:  COREG Take 1 tablet (12.5 mg total) by mouth 2 (two) times daily.   ceFAZolin 2-4 GM/100ML-% IVPB Commonly known as:  ANCEF Inject 100 mLs (2 g total) into the vein every 8 (eight) hours.   clobetasol 0.05 % external solution Commonly known as:  TEMOVATE Apply 1 application topically 2 (two) times daily as needed (for psoriasis).   docusate sodium 100 MG capsule Commonly known as:  COLACE Take 1 capsule (100 mg total) by mouth 2 (two) times daily.   ferrous sulfate 325 (65 FE) MG tablet Commonly known as:  FERROUSUL Take 1 tablet (325 mg total) by mouth 3 (three) times daily with meals.   HYDROcodone-acetaminophen 7.5-325 MG tablet Commonly known as:  NORCO Take 1-2 tablets by mouth every 4 (four) hours as needed for moderate pain. Replaces:  HYDROcodone-acetaminophen 5-325 MG tablet   Ixekizumab 80 MG/ML Soaj Commonly known as:  TALTZ Inject 80 mg into the skin every 30 (thirty) days.   metFORMIN 500 MG 24 hr tablet Commonly known as:  GLUCOPHAGE-XR Take 500 mg by mouth 2 (two) times daily.   methocarbamol 500 MG tablet Commonly known as:  ROBAXIN Take 1 tablet (500 mg total) by mouth every 6 (six) hours as needed for muscle spasms. What changed:    when to take this  reasons to take this   oxymetazoline 0.05 % nasal spray Commonly known as:  AFRIN Place 1 spray into both nostrils at bedtime.   pantoprazole 40 MG tablet Commonly known as:  PROTONIX Take 1 tablet (40 mg total) by mouth every other day.   polyethylene glycol packet Commonly known as:  MIRALAX / GLYCOLAX Take 17 g by mouth 2 (two) times daily.   rivaroxaban 20 MG Tabs tablet Commonly known as:  XARELTO Take 20 mg by mouth daily.   rosuvastatin 10 MG tablet Commonly known as:  CRESTOR Take 1 tablet (10 mg total) by mouth daily.   sacubitril-valsartan 24-26 MG Commonly known as:  ENTRESTO Take 1 tablet by mouth 2 (two) times  daily.   Vitamin D (Cholecalciferol) 25 MCG (1000 UT) Caps Take 1,000 Units by mouth daily.  Home Infusion Instuctions  (From admission, onward)         Start     Ordered   09/12/18 0000  Home infusion instructions [facility] May follow [place] Dosing Protocol; May administer Cathflo as needed to maintain patency of vascular access device.; Flushing of vascular access device: per [facility] Protocol: 0.9% NaCl pre/post medica...    Question Answer Comment  Instructions May follow [place] Dosing Protocol   Instructions May administer Cathflo as needed to maintain patency of vascular access device.   Instructions Flushing of vascular access device: per [facility] Protocol: 0.9% NaCl pre/post medication administration and prn patency; Heparin 100 u/ml, 39m for implanted ports and Heparin 10u/ml, 541mfor all other central venous catheters.   Instructions May follow [facility] Anaphylaxis Protocol for First Dose Administration in the home: 0.9% NaCl at 25-50 ml/hr to maintain IV access for protocol meds. Epinephrine 0.3 ml IV/IM PRN and Benadryl 25-50 IV/IM PRN s/s of anaphylaxis.   Instructions [facility] Infusion Coordinator (RN) to assist per patient IV care needs in the home PRN.      09/12/18 0749   09/12/18 0000  Home infusion instructions [facility] May follow [facility] Protocol; May administer Cathflo as needed to maintain patency of vascular access device.; Flushing of vascular access device: [facility]: 0.9% NaCl pre/post medica...    Question Answer Comment  Instructions May follow [place] Protocol   Instructions May administer Cathflo as needed to maintain patency of vascular access device.   Instructions Flushing of vascular access device: per [facility]: 0.9% NaCl pre/post medication administration and prn patency; Heparin 100 u/ml, 52m58mor implanted ports and Heparin 10u/ml, 52ml8mr all other central venous catheters.     Instructions May follow [facility] Anaphylaxis Protocol for First Dose Administration in the home: 0.9% NaCl at 25-50 ml/hr to maintain IV access for protocol meds. Epinephrine 0.3 ml IV/IM PRN and Benadryl 25-50 IV/IM PRN s/s of anaphylaxis.   Instructions [facility] Infusion Coordinator (RN) to assist per patient IV care needs in the home PRN.      09/12/18 1207           Discharge Care Instructions  (From admission, onward)         Start     Ordered   09/12/18 0000  Change dressing    Comments:  Maintain surgical dressing until follow up in the clinic. If the edges start to pull up, may reinforce with tape. If the dressing is no longer working, may remove and cover with gauze and tape, but must keep the area dry and clean.  Call with any questions or concerns.   09/12/18 0753           Signed: MattWest Pughbish   PA-C  09/13/2018, 3:03 PM

## 2018-09-13 NOTE — Telephone Encounter (Signed)
Spoke with pt - he will discuss with his wife regarding Lovenox monotherapy vs bridge with warfarin. He believes his copay for an office visit at [facility] would be $50 each, so weekly INR checks until his INR is stable might end up being more expensive than staying on the Lovenox monotherapy for the 6 month period.

## 2018-09-14 ENCOUNTER — Other Ambulatory Visit: Payer: Self-pay | Admitting: Pharmacist

## 2018-09-14 MED ORDER — ENOXAPARIN SODIUM 120 MG/0.8ML ~~LOC~~ SOLN: 120.0000 mg | Freq: Two times a day (BID) | SUBCUTANEOUS | 5 refills | Status: DC

## 2018-09-14 MED FILL — CELECOXIB 200 MG CAP: 200 | 30 days supply | Qty: 30 | Fill #1

## 2018-09-15 LAB — AEROBIC/ANAEROBIC CULTURE W GRAM STAIN (SURGICAL/DEEP WOUND)

## 2018-09-15 LAB — AEROBIC/ANAEROBIC CULTURE (SURGICAL/DEEP WOUND)

## 2018-09-19 DIAGNOSIS — Z471 Aftercare following joint replacement surgery: Secondary | ICD-10-CM | POA: Diagnosis not present

## 2018-09-19 DIAGNOSIS — K219 Gastro-esophageal reflux disease without esophagitis: Secondary | ICD-10-CM | POA: Diagnosis not present

## 2018-09-19 DIAGNOSIS — T8453XA Infection and inflammatory reaction due to internal right knee prosthesis, initial encounter: Secondary | ICD-10-CM | POA: Diagnosis not present

## 2018-09-19 DIAGNOSIS — M25361 Other instability, right knee: Secondary | ICD-10-CM | POA: Diagnosis not present

## 2018-09-19 DIAGNOSIS — Z96652 Presence of left artificial knee joint: Secondary | ICD-10-CM | POA: Diagnosis not present

## 2018-09-19 DIAGNOSIS — Z7689 Persons encountering health services in other specified circumstances: Secondary | ICD-10-CM | POA: Diagnosis not present

## 2018-09-19 DIAGNOSIS — M25561 Pain in right knee: Secondary | ICD-10-CM | POA: Diagnosis not present

## 2018-09-19 DIAGNOSIS — Z96651 Presence of right artificial knee joint: Secondary | ICD-10-CM | POA: Diagnosis not present

## 2018-09-19 DIAGNOSIS — Z96642 Presence of left artificial hip joint: Secondary | ICD-10-CM | POA: Diagnosis not present

## 2018-09-19 DIAGNOSIS — E8881 Metabolic syndrome: Secondary | ICD-10-CM | POA: Diagnosis not present

## 2018-09-20 ENCOUNTER — Other Ambulatory Visit: Payer: 59

## 2018-09-21 MED FILL — METHOCARBAMOL 500 MG TABS: 500 | 10 days supply | Qty: 30 | Fill #0

## 2018-09-21 MED FILL — PREGABALIN 75 MG CAPS: 75 | 30 days supply | Qty: 60 | Fill #0

## 2018-09-21 MED FILL — HYDROCODON-APAP 7.5-325: 7.5-325 | 8 days supply | Qty: 60 | Fill #0

## 2018-09-22 ENCOUNTER — Other Ambulatory Visit: Payer: 59 | Admitting: *Deleted

## 2018-09-22 ENCOUNTER — Ambulatory Visit (INDEPENDENT_AMBULATORY_CARE_PROVIDER_SITE_OTHER): Payer: 59 | Admitting: Nurse Practitioner

## 2018-09-22 VITALS — BP 124/76 | Ht 70.0 in | Wt 241.2 lb

## 2018-09-22 DIAGNOSIS — L03119 Cellulitis of unspecified part of limb: Secondary | ICD-10-CM | POA: Diagnosis not present

## 2018-09-22 DIAGNOSIS — T8453XA Infection and inflammatory reaction due to internal right knee prosthesis, initial encounter: Secondary | ICD-10-CM | POA: Diagnosis not present

## 2018-09-22 DIAGNOSIS — I5022 Chronic systolic (congestive) heart failure: Secondary | ICD-10-CM | POA: Diagnosis not present

## 2018-09-22 DIAGNOSIS — Z96652 Presence of left artificial knee joint: Secondary | ICD-10-CM | POA: Diagnosis not present

## 2018-09-22 DIAGNOSIS — E8881 Metabolic syndrome: Secondary | ICD-10-CM | POA: Diagnosis not present

## 2018-09-22 DIAGNOSIS — Z96642 Presence of left artificial hip joint: Secondary | ICD-10-CM | POA: Diagnosis not present

## 2018-09-22 DIAGNOSIS — M150 Primary generalized (osteo)arthritis: Secondary | ICD-10-CM | POA: Diagnosis not present

## 2018-09-22 DIAGNOSIS — K219 Gastro-esophageal reflux disease without esophagitis: Secondary | ICD-10-CM | POA: Diagnosis not present

## 2018-09-22 DIAGNOSIS — I10 Essential (primary) hypertension: Secondary | ICD-10-CM | POA: Diagnosis not present

## 2018-09-22 LAB — BASIC METABOLIC PANEL
BUN/Creatinine Ratio: 17 (ref 9–20)
BUN: 16 mg/dL (ref 6–24)
CALCIUM: 9.6 mg/dL (ref 8.7–10.2)
CO2: 23 mmol/L (ref 20–29)
Chloride: 92 mmol/L — ABNORMAL LOW (ref 96–106)
Creatinine, Ser: 0.96 mg/dL (ref 0.76–1.27)
GFR calc Af Amer: 100 mL/min/{1.73_m2} (ref >59)
GFR calc non Af Amer: 86 mL/min/{1.73_m2} (ref >59)
Glucose: 215 mg/dL — ABNORMAL HIGH (ref 65–99)
Potassium: 4.3 mmol/L (ref 3.5–5.2)
SODIUM: 129 mmol/L — AB (ref 134–144)

## 2018-09-22 MED ORDER — ENOXAPARIN SODIUM 100 MG/ML ~~LOC~~ SOLN: 100.0000 mg | Freq: Two times a day (BID) | SUBCUTANEOUS | 5 refills | Status: DC

## 2018-09-22 MED FILL — ENOXAPARIN 100 MG/ML SYR: 100 | 30 days supply | Qty: 60 | Fill #0

## 2018-09-22 NOTE — Patient Instructions (Addendum)
Medication Instructions:  Dose of Lovenox changed due to decrease in patient's weight  If you need a refill on your cardiac medications before your next appointment, please call your pharmacy.    Lab work: Done Today - we will call you with results    Testing/Procedures: None Ordered    Follow-Up: Your physician recommends that you return for a follow-up appointment on Monday February 24 with Dr. Acie Fredrickson

## 2018-09-22 NOTE — Progress Notes (Signed)
1.) Reason for visit: BP and BMET after starting Entresto 24-26 mg BID on 1/6  2.) Name of MD requesting visit: [name]  3.) H&P: Patient presents ambulatory with continued right knee pain and is undergoing tx of infection of prosthetic joint. He denies concerns r/t starting Entresto  4.) ROS related to problem: BP good today. Patient denies complaints r/t starting Entresto. He states he has an accumulation of blood on his lower right leg below the knee. He states [name] attempted to drain it thinking it was fluid, but discovered that it was blood. Patient states [name] requests a decrease in Lovenox dose until bleeding resolves. I discussed with [name], [name] who advised that due to patient's decrease in weight, that patient's dose may be lowered. Rx was completed by [name] for Lovenox 100 mg BID. Patient and wife aware and were very grateful for our help.   5.) Assessment and plan per MD: will review BMET and advise patient appropriately. Patient has follow-up appointment with [name] in 4 weeks. Will forward note to [name] for advice on increasing Entresto.    Called [facility] OP pharmacy and spoke with [name]; updated patient's medication list with appropriate dose of Lyrica Patient states he is not aware of telling anyone that he does not tolerate Entresto due to GI side effects.

## 2018-09-26 ENCOUNTER — Encounter: Payer: Self-pay | Admitting: Family Medicine

## 2018-09-26 DIAGNOSIS — Z96642 Presence of left artificial hip joint: Secondary | ICD-10-CM | POA: Diagnosis not present

## 2018-09-26 DIAGNOSIS — T8453XA Infection and inflammatory reaction due to internal right knee prosthesis, initial encounter: Secondary | ICD-10-CM | POA: Diagnosis not present

## 2018-09-26 DIAGNOSIS — E8881 Metabolic syndrome: Secondary | ICD-10-CM | POA: Diagnosis not present

## 2018-09-26 DIAGNOSIS — Z7689 Persons encountering health services in other specified circumstances: Secondary | ICD-10-CM | POA: Diagnosis not present

## 2018-09-26 DIAGNOSIS — K219 Gastro-esophageal reflux disease without esophagitis: Secondary | ICD-10-CM

## 2018-09-26 DIAGNOSIS — Z96652 Presence of left artificial knee joint: Secondary | ICD-10-CM | POA: Diagnosis not present

## 2018-09-26 MED FILL — ROSUVASTATIN CALCIUM 10 MG: 10 | 90 days supply | Qty: 90 | Fill #1

## 2018-09-26 MED FILL — CEPHALEXIN 500 MG CAPSULE: 500 | 7 days supply | Qty: 28 | Fill #0

## 2018-09-27 MED ORDER — PANTOPRAZOLE SODIUM 40 MG PO TBEC: 40.0000 mg | DELAYED_RELEASE_TABLET | Freq: Every day | ORAL | 3 refills | Status: DC

## 2018-09-27 MED FILL — SM ALCOHOL 70% PREP PADS: 70 | 50 days supply | Qty: 100 | Fill #2

## 2018-09-27 MED FILL — PANTOPRAZOLE SOD DR 40 MG T: 40 | 90 days supply | Qty: 90 | Fill #0

## 2018-10-03 DIAGNOSIS — T8453XA Infection and inflammatory reaction due to internal right knee prosthesis, initial encounter: Secondary | ICD-10-CM | POA: Diagnosis not present

## 2018-10-03 DIAGNOSIS — Z96642 Presence of left artificial hip joint: Secondary | ICD-10-CM | POA: Diagnosis not present

## 2018-10-03 DIAGNOSIS — B999 Unspecified infectious disease: Secondary | ICD-10-CM | POA: Diagnosis not present

## 2018-10-03 DIAGNOSIS — K219 Gastro-esophageal reflux disease without esophagitis: Secondary | ICD-10-CM | POA: Diagnosis not present

## 2018-10-03 DIAGNOSIS — Z96652 Presence of left artificial knee joint: Secondary | ICD-10-CM | POA: Diagnosis not present

## 2018-10-03 DIAGNOSIS — E8881 Metabolic syndrome: Secondary | ICD-10-CM | POA: Diagnosis not present

## 2018-10-06 DIAGNOSIS — L03119 Cellulitis of unspecified part of limb: Secondary | ICD-10-CM | POA: Diagnosis not present

## 2018-10-06 DIAGNOSIS — Z96642 Presence of left artificial hip joint: Secondary | ICD-10-CM | POA: Diagnosis not present

## 2018-10-06 DIAGNOSIS — T8453XA Infection and inflammatory reaction due to internal right knee prosthesis, initial encounter: Secondary | ICD-10-CM | POA: Diagnosis not present

## 2018-10-06 DIAGNOSIS — Z96652 Presence of left artificial knee joint: Secondary | ICD-10-CM | POA: Diagnosis not present

## 2018-10-06 DIAGNOSIS — M150 Primary generalized (osteo)arthritis: Secondary | ICD-10-CM | POA: Diagnosis not present

## 2018-10-06 DIAGNOSIS — K219 Gastro-esophageal reflux disease without esophagitis: Secondary | ICD-10-CM | POA: Diagnosis not present

## 2018-10-06 DIAGNOSIS — E8881 Metabolic syndrome: Secondary | ICD-10-CM | POA: Diagnosis not present

## 2018-10-10 ENCOUNTER — Telehealth: Payer: Self-pay | Admitting: Pharmacist

## 2018-10-10 ENCOUNTER — Encounter: Payer: Self-pay | Admitting: Internal Medicine

## 2018-10-10 ENCOUNTER — Ambulatory Visit: Payer: 59 | Admitting: Internal Medicine

## 2018-10-10 DIAGNOSIS — K219 Gastro-esophageal reflux disease without esophagitis: Secondary | ICD-10-CM | POA: Diagnosis not present

## 2018-10-10 DIAGNOSIS — B999 Unspecified infectious disease: Secondary | ICD-10-CM | POA: Diagnosis not present

## 2018-10-10 DIAGNOSIS — Z96642 Presence of left artificial hip joint: Secondary | ICD-10-CM | POA: Diagnosis not present

## 2018-10-10 DIAGNOSIS — T8459XD Infection and inflammatory reaction due to other internal joint prosthesis, subsequent encounter: Secondary | ICD-10-CM | POA: Diagnosis not present

## 2018-10-10 DIAGNOSIS — Z96659 Presence of unspecified artificial knee joint: Secondary | ICD-10-CM

## 2018-10-10 DIAGNOSIS — T8453XA Infection and inflammatory reaction due to internal right knee prosthesis, initial encounter: Secondary | ICD-10-CM | POA: Diagnosis not present

## 2018-10-10 DIAGNOSIS — E8881 Metabolic syndrome: Secondary | ICD-10-CM | POA: Diagnosis not present

## 2018-10-10 DIAGNOSIS — Z96652 Presence of left artificial knee joint: Secondary | ICD-10-CM | POA: Diagnosis not present

## 2018-10-10 MED ORDER — CEPHALEXIN 500 MG PO CAPS: 500.0000 mg | ORAL_CAPSULE | Freq: Three times a day (TID) | ORAL | 2 refills | Status: DC

## 2018-10-10 MED ORDER — PANTOPRAZOLE SODIUM 40 MG PO TBEC: 40.0000 mg | DELAYED_RELEASE_TABLET | Freq: Two times a day (BID) | ORAL | 3 refills | Status: DC

## 2018-10-10 MED ORDER — CEFAZOLIN SODIUM-DEXTROSE 2-4 GM/100ML-% IV SOLN: 2.0000 g | Freq: Three times a day (TID) | INTRAVENOUS | 0 refills | Status: AC

## 2018-10-10 MED ORDER — RIFAMPIN 300 MG PO CAPS: 600.0000 mg | ORAL_CAPSULE | Freq: Every day | ORAL | 2 refills | Status: DC

## 2018-10-10 MED FILL — rifAMPin 300 MG CAPS: 300 | 90 days supply | Qty: 180 | Fill #0 | Status: TO

## 2018-10-10 MED FILL — CEPHALEXIN 500 MG CAPSULE: 500 | 90 days supply | Qty: 270 | Fill #0

## 2018-10-10 MED FILL — TALTZ 80 MG/ML AUTOINJECTOR: 80 | 30 days supply | Qty: 1 | Fill #2

## 2018-10-10 NOTE — Telephone Encounter (Signed)
Called and spoke to [place] at [facility] and gave verbal orders per Dr. Megan Salon to pull patient's PICC on 2/20. Coretta verbalized understanding.

## 2018-10-10 NOTE — Assessment & Plan Note (Signed)
He is improving on therapy for MSSA right prosthetic knee infection.  I will have him complete IV cefazolin and have his PICC line removed on 10/20/2018.  He will start oral cephalexin and continue oral rifampin at that time.  I have instructed him to try taking his Protonix twice daily to see if that helps him tolerate rifampin better.  He tells me that he is a "tough old goat" and he believes that he will be able to stay on rifampin and his enoxaparin injections for now.  He will follow-up here in 6 weeks.

## 2018-10-10 NOTE — Progress Notes (Signed)
[facility] for Infectious Disease  Patient Active Problem List   Diagnosis Date Noted  . Infected right TKA 09/10/2018    Priority: High  . S/P TKR (total knee replacement), right 05/31/2018    Priority: High  . S/P left TKA 06/28/2018  . Tubular adenoma of colon 10/18/2017  . 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 Diabetic nephropathy associated with type 2 diabetes mellitus ([place]) 10/13/2016  . Permanent atrial fibrillation 10/13/2016  . Polycythemia vera ([place]) 10/09/2014  . Hyperlipidemia associated with type 2 diabetes mellitus ([place]) 09/17/2014  . Hypertension associated with diabetes ([place]) 09/17/2014  . Diabetes mellitus ([place]) 10/31/2013  . Hx-TIA (transient ischemic attack) 09/05/2012  . GERD (gastroesophageal reflux disease) 08/17/2011  . Obesity (BMI 30-39.9) 08/17/2011  . Psoriatic arthritis ([place]) 08/17/2011  . Obstructive sleep apnea 08/05/2009    Patient's Medications  New Prescriptions   CEPHALEXIN (KEFLEX) 500 MG CAPSULE    Take 1 capsule (500 mg total) by mouth 3 (three) times daily.  Previous Medications   CARVEDILOL (COREG) 12.5 MG TABLET    Take 1 tablet (12.5 mg total) by mouth 2 (two) times daily.   CLOBETASOL (TEMOVATE) 0.05 % EXTERNAL SOLUTION    Apply 1 application topically 2 (two) times daily as needed (for psoriasis).    ENOXAPARIN (LOVENOX) 100 MG/ML INJECTION    Inject 1 mL (100 mg total) into the skin every 12 (twelve) hours.   HYDROCODONE-ACETAMINOPHEN (NORCO) 7.5-325 MG TABLET    Take 1-2 tablets by mouth every 4 (four) hours as needed for moderate pain.   IXEKIZUMAB (TALTZ) 80 MG/ML SOAJ    Inject 80 mg into the skin every 30 (thirty) days.   METFORMIN (GLUCOPHAGE-XR) 500 MG 24 HR TABLET    Take 500 mg by mouth 2 (two) times daily.   METHOCARBAMOL (ROBAXIN) 500 MG TABLET    Take 1 tablet (500 mg total) by mouth every 6 (six) hours as needed for muscle spasms.   OXYMETAZOLINE (AFRIN) 0.05 % NASAL SPRAY    Place 1 spray into  both nostrils at bedtime.   PREGABALIN (LYRICA) 75 MG CAPSULE    Take 75 mg by mouth 2 (two) times daily.   ROSUVASTATIN (CRESTOR) 10 MG TABLET    Take 1 tablet (10 mg total) by mouth daily.   SACUBITRIL-VALSARTAN (ENTRESTO) 24-26 MG    Take 1 tablet by mouth 2 (two) times daily.   VITAMIN D, CHOLECALCIFEROL, 1000 UNITS CAPS    Take 1,000 Units by mouth daily.   Modified Medications   Modified Medication Previous Medication   CEFAZOLIN (ANCEF) 2-4 GM/100ML-% IVPB ceFAZolin (ANCEF) 2-4 GM/100ML-% IVPB      Inject 100 mLs (2 g total) into the vein every 8 (eight) hours for 10 days.    Inject 100 mLs (2 g total) into the vein every 8 (eight) hours.   PANTOPRAZOLE (PROTONIX) 40 MG TABLET pantoprazole (PROTONIX) 40 MG tablet      Take 1 tablet (40 mg total) by mouth 2 (two) times daily.    Take 1 tablet (40 mg total) by mouth daily.   RIFAMPIN (RIFADIN) 300 MG CAPSULE rifampin (RIFADIN) 300 MG capsule      Take 2 capsules (600 mg total) by mouth daily.    Take 2 capsules (600 mg total) by mouth daily.  Discontinued Medications   No medications on file    Subjective: [name] is in for his routine hospital follow-up visit.  He underwent right  total knee arthroplasty on 05/31/2018.  He did well initially then developed acute onset of swelling and severe pain.  He was readmitted to the hospital and underwent incision and drainage with poly-exchange on 09/10/2018.  Operative cultures grew MSSA.  He was discharged on IV cefazolin and oral rifampin.  He has not had any problems tolerating cefazolin or his PICC.  He has had some nausea and worsening acid reflux since starting rifampin.  He did start taking Protonix every morning as I instructed but is still bothered by acid reflux.  His nausea has resolved.  As the result of being on the rifampin he is also requiring enoxaparin injections because of the drug drug interaction between rifampin and oral anticoagulants.  He is having much less pain in his right knee over  the past week.  He says that he feels like he "turned the corner" 1 week ago.  He is back at work half time now.  He has not required any narcotic pain medication in the last few days.  Review of Systems: Review of Systems  Constitutional: Positive for malaise/fatigue and weight loss. Negative for chills, diaphoresis and fever.  Gastrointestinal: Positive for heartburn and nausea. Negative for abdominal pain, diarrhea and vomiting.  Musculoskeletal: Positive for joint pain.  Skin: Negative for rash.    Past Medical History:  Diagnosis Date  . Arthritis   . Diverticulosis   . Dyslipidemia   . GERD (gastroesophageal reflux disease)   . Herpes labialis   . Hypertension   . Longstanding persistent atrial fibrillation   . Metabolic syndrome   . Obesity   . Proteinuria   . Psoriasis   . Seborrheic dermatitis   . Sleep apnea    very compliant with CPAP  . TIA (transient ischemic attack) 2009    Social History   Tobacco Use  . Smoking status: Never Smoker  . Smokeless tobacco: Never Used  Substance Use Topics  . Alcohol use: Yes    Comment: 2/month  . Drug use: No    Family History  Problem Relation Age of Onset  . Uterine cancer Mother 22       Cervical cath  . Crohn's disease Mother   . Esophageal cancer Father   . Cancer Other   . Obesity Other     Allergies  Allergen Reactions  . Morphine And Related Other (See Comments)    Hallucinations    Objective: Vitals:   10/10/18 1010  Temp: (!) 97.5 F (36.4 C)  Weight: 246 lb (111.6 kg)   Body mass index is 35.3 kg/m.  Physical Exam Constitutional:      Comments: He is talkative and in good spirits.  He is accompanied by his wife, [name]  His weight is down about 50 pounds over the past year through intentional changes in his diet.  Musculoskeletal:     Comments: His right knee incision is healed nicely.  He does have some persistent swelling and slight warmth of his right knee but this is much improved.  His  range of motion is reasonably good.  He has not started physical therapy yet.  Skin:    Comments: Right arm PICC looks good.  Psychiatric:        Mood and Affect: Mood normal.        Problem List Items Addressed This Visit      High   Infected right TKA    He is improving on therapy for MSSA right prosthetic knee infection.  I will have him complete IV cefazolin and have his PICC line removed on 10/20/2018.  He will start oral cephalexin and continue oral rifampin at that time.  I have instructed him to try taking his Protonix twice daily to see if that helps him tolerate rifampin better.  He tells me that he is a "tough old goat" and he believes that he will be able to stay on rifampin and his enoxaparin injections for now.  He will follow-up here in 6 weeks.      Relevant Medications   ceFAZolin (ANCEF) 2-4 GM/100ML-% IVPB   rifampin (RIFADIN) 300 MG capsule   cephALEXin (KEFLEX) 500 MG capsule (Start on 10/21/2018)     Unprioritized   GERD (gastroesophageal reflux disease)   Relevant Medications   pantoprazole (PROTONIX) 40 MG tablet       Michel Bickers, MD [facility] [place] [facility] [phone] pager   [phone] cell 10/10/2018, 12:35 PM

## 2018-10-11 DIAGNOSIS — Z760 Encounter for issue of repeat prescription: Secondary | ICD-10-CM | POA: Diagnosis not present

## 2018-10-17 DIAGNOSIS — T8453XA Infection and inflammatory reaction due to internal right knee prosthesis, initial encounter: Secondary | ICD-10-CM | POA: Diagnosis not present

## 2018-10-17 DIAGNOSIS — B999 Unspecified infectious disease: Secondary | ICD-10-CM | POA: Diagnosis not present

## 2018-10-17 MED FILL — PREGABALIN 75 MG CAPS: 75 | 30 days supply | Qty: 60 | Fill #1

## 2018-10-18 MED FILL — METHOCARBAMOL 500 MG TABS: 500 | 10 days supply | Qty: 30 | Fill #1

## 2018-10-20 ENCOUNTER — Ambulatory Visit: Payer: 59 | Admitting: Family Medicine

## 2018-10-20 ENCOUNTER — Encounter: Payer: Self-pay | Admitting: Family Medicine

## 2018-10-20 VITALS — BP 128/76 | HR 56 | Temp 98.0°F | Ht 70.0 in | Wt 244.4 lb

## 2018-10-20 DIAGNOSIS — E1121 Type 2 diabetes mellitus with diabetic nephropathy: Secondary | ICD-10-CM

## 2018-10-20 DIAGNOSIS — Z96659 Presence of unspecified artificial knee joint: Secondary | ICD-10-CM

## 2018-10-20 DIAGNOSIS — Z125 Encounter for screening for malignant neoplasm of prostate: Secondary | ICD-10-CM | POA: Diagnosis not present

## 2018-10-20 DIAGNOSIS — T8459XD Infection and inflammatory reaction due to other internal joint prosthesis, subsequent encounter: Secondary | ICD-10-CM

## 2018-10-20 DIAGNOSIS — L4050 Arthropathic psoriasis, unspecified: Secondary | ICD-10-CM

## 2018-10-20 DIAGNOSIS — I10 Essential (primary) hypertension: Secondary | ICD-10-CM

## 2018-10-20 DIAGNOSIS — E785 Hyperlipidemia, unspecified: Secondary | ICD-10-CM

## 2018-10-20 DIAGNOSIS — E1159 Type 2 diabetes mellitus with other circulatory complications: Secondary | ICD-10-CM

## 2018-10-20 DIAGNOSIS — K219 Gastro-esophageal reflux disease without esophagitis: Secondary | ICD-10-CM

## 2018-10-20 DIAGNOSIS — E871 Hypo-osmolality and hyponatremia: Secondary | ICD-10-CM

## 2018-10-20 DIAGNOSIS — D45 Polycythemia vera: Secondary | ICD-10-CM

## 2018-10-20 DIAGNOSIS — I4821 Permanent atrial fibrillation: Secondary | ICD-10-CM | POA: Diagnosis not present

## 2018-10-20 DIAGNOSIS — Z8673 Personal history of transient ischemic attack (TIA), and cerebral infarction without residual deficits: Secondary | ICD-10-CM | POA: Diagnosis not present

## 2018-10-20 DIAGNOSIS — E1169 Type 2 diabetes mellitus with other specified complication: Secondary | ICD-10-CM

## 2018-10-20 DIAGNOSIS — Z96652 Presence of left artificial knee joint: Secondary | ICD-10-CM

## 2018-10-20 DIAGNOSIS — E669 Obesity, unspecified: Secondary | ICD-10-CM | POA: Diagnosis not present

## 2018-10-20 DIAGNOSIS — Z0000 Encounter for general adult medical examination without abnormal findings: Secondary | ICD-10-CM | POA: Diagnosis not present

## 2018-10-20 DIAGNOSIS — I152 Hypertension secondary to endocrine disorders: Secondary | ICD-10-CM

## 2018-10-20 DIAGNOSIS — Z96651 Presence of right artificial knee joint: Secondary | ICD-10-CM

## 2018-10-20 LAB — POCT URINALYSIS DIP (PROADVANTAGE DEVICE)
BILIRUBIN UA: NEGATIVE mg/dL
Bilirubin, UA: NEGATIVE
Blood, UA: NEGATIVE
Glucose, UA: NEGATIVE mg/dL
Leukocytes, UA: NEGATIVE
Nitrite, UA: NEGATIVE
Specific Gravity, Urine: 1.03
Urobilinogen, Ur: 3.5
pH, UA: 6 (ref 5.0–8.0)

## 2018-10-20 LAB — POCT GLYCOSYLATED HEMOGLOBIN (HGB A1C): HEMOGLOBIN A1C: 5.2 % (ref 4.0–5.6)

## 2018-10-20 LAB — POCT UA - MICROALBUMIN
Albumin/Creatinine Ratio, Urine, POC: >672.6
Creatinine, POC: 44.6 mg/dL
Microalbumin Ur, POC: >300 mg/L

## 2018-10-20 MED ORDER — EMPAGLIFLOZIN 10 MG PO TABS: 10.0000 mg | ORAL_TABLET | Freq: Every day | ORAL | 5 refills | Status: DC

## 2018-10-20 MED ORDER — ROSUVASTATIN CALCIUM 10 MG PO TABS: 10.0000 mg | ORAL_TABLET | Freq: Every day | ORAL | 3 refills | Status: DC

## 2018-10-20 MED FILL — JARDIANCE 10 MG TABLET: 10 | 30 days supply | Qty: 30 | Fill #0

## 2018-10-20 NOTE — Progress Notes (Signed)
Subjective:    Patient ID: [name]., male    DOB: 1960-10-11, 58 y.o.   MRN: [id number]  HPI He is here for complete examination.  He is in the process of being treated for an infected prosthetic knee.  Presently he is on a 6 weeks course of antibiotic.  He was also switched from Xarelto to Lovenox due to drug drug interaction.  During that timeframe of having difficulty with this he lost a significant amount of weight due to inability to eat properly.  He has been on metformin.  He recently saw his cardiologist due to his underlying A. fib as well as previous TIA from a PFO.  Presently he is also taking Protonix twice per day to reduce likelihood of reflux type symptoms.  Continues on Crestor and is having no difficulty with that.  He also has an underlying history of polycythemia vera and is monitored for that regularly.  He also has OSA and is on CPAP and seems to be doing well on that.  He was placed on Lyrica postoperatively to help with knee pain.  Review of recent blood work did show slightly low potassium.  Does have a history of psoriatic arthritis which seems to be under adequate control.  In general he seems to be doing fairly well all things considered.  Review of Systems  All other systems reviewed and are negative.      Objective:   Physical Exam BP 128/76 (BP Location: Left Arm, Patient Position: Sitting)   Pulse (!) 56   Temp 98 F (36.7 C)   Ht 5\' 10"  (1.778 m)   Wt 244 lb 6.4 oz (110.9 kg)   SpO2 98%   BMI 35.07 kg/m   General Appearance:    Alert, cooperative, no distress, appears stated age  Head:    Normocephalic, without obvious abnormality, atraumatic  Eyes:    PERRL, conjunctiva/corneas clear, EOM's intact, fundi    benign  Ears:    Normal TM's and external ear canals  Nose:   Nares normal, mucosa normal, no drainage or sinus   tenderness  Throat:   Lips, mucosa, and tongue normal; teeth and gums normal  Neck:   Supple, no lymphadenopathy;  thyroid:   no   enlargement/tenderness/nodules; no carotid   bruit or JVD     Lungs:     Clear to auscultation bilaterally without wheezes, rales or     ronchi; respirations unlabored      Heart:    Irregular rate and rhythm, S1 and S2 normal, no murmur, rub   or gallop     Abdomen:     Soft, non-tender, nondistended, normoactive bowel sounds,    no masses, no hepatosplenomegaly  Genitalia:   Deferred  Rectal:   Deferred  Extremities:   No clubbing, cyanosis or edema.  Surgical scars noted on both knees  Pulses:   2+ and symmetric all extremities  Skin:   Skin color, texture, turgor normal, no rashes or lesions  Lymph nodes:   Cervical, supraclavicular, and axillary nodes normal  Neurologic:   CNII-XII intact, normal strength, sensation and gait; reflexes 2+ and symmetric throughout          Psych:   Normal mood, affect, hygiene and grooming.    A1c is 5.2       Assessment & Plan:  Routine general medical examination at a health care facility - Plan: POCT Urinalysis DIP (Proadvantage Device)  Type 2 diabetes mellitus with diabetic  nephropathy, without long-term current use of insulin ([place]) - Plan: empagliflozin (JARDIANCE) 10 MG TABS tablet, POCT glycosylated hemoglobin (Hb A1C), POCT UA - Microalbumin  Diabetic nephropathy associated with type 2 diabetes mellitus ([place]) - Plan: Comprehensive metabolic panel  Gastroesophageal reflux disease without esophagitis  Hx-TIA (transient ischemic attack)  Hyperlipidemia associated with type 2 diabetes mellitus ([place]) - Plan: Lipid panel, rosuvastatin (CRESTOR) 10 MG tablet  Hypertension associated with diabetes ([place])  Infection of prosthetic knee joint, subsequent encounter  Obesity (BMI 30-39.9)  Permanent atrial fibrillation  Polycythemia vera ([place])  Screening for prostate cancer - Plan: PSA  Hyponatremia  S/P TKR (total knee replacement), right  S/P left TKA  Psoriatic arthritis ([place]) He will continue to be followed by  infectious disease for his infected knee. I will have him stop metformin and switch him to [facility] for diabetes as well as cardiac benefit. He will stop taking Lyrica and keep me informed as to how he is doing with his pain. He will continue to be followed by hematology for his polycythemia. He will continue on his psoriatic arthritis medication. Continue on Crestor as well as Protonix. Recommend Tylenol for pain relief and keep me informed since we are stopping the Lyrica. I encouraged him to continue with his present eating habits and hopefully he will continue to lose more weight.

## 2018-10-20 NOTE — Patient Instructions (Signed)
Lets stop the Lyrica and see how you do and take Tylenol for your pain instead of ibuprofen

## 2018-10-21 LAB — COMPREHENSIVE METABOLIC PANEL
ALBUMIN: 4 g/dL (ref 3.8–4.9)
ALT: 20 IU/L (ref 0–44)
AST: 26 IU/L (ref 0–40)
Albumin/Globulin Ratio: 1.4 (ref 1.2–2.2)
Alkaline Phosphatase: 66 IU/L (ref 39–117)
BUN / CREAT RATIO: 17 (ref 9–20)
BUN: 15 mg/dL (ref 6–24)
Bilirubin Total: 1 mg/dL (ref 0.0–1.2)
CO2: 18 mmol/L — ABNORMAL LOW (ref 20–29)
Calcium: 9.3 mg/dL (ref 8.7–10.2)
Chloride: 103 mmol/L (ref 96–106)
Creatinine, Ser: 0.9 mg/dL (ref 0.76–1.27)
GFR calc Af Amer: 108 mL/min/{1.73_m2} (ref >59)
GFR calc non Af Amer: 93 mL/min/{1.73_m2} (ref >59)
Globulin, Total: 2.8 g/dL (ref 1.5–4.5)
Glucose: 116 mg/dL — ABNORMAL HIGH (ref 65–99)
Potassium: 4.5 mmol/L (ref 3.5–5.2)
SODIUM: 138 mmol/L (ref 134–144)
Total Protein: 6.8 g/dL (ref 6.0–8.5)

## 2018-10-21 LAB — LIPID PANEL
Chol/HDL Ratio: 3.4 ratio (ref 0.0–5.0)
Cholesterol, Total: 141 mg/dL (ref 100–199)
HDL: 42 mg/dL (ref >39)
LDL CALC: 71 mg/dL (ref 0–99)
Triglycerides: 142 mg/dL (ref 0–149)
VLDL CHOLESTEROL CAL: 28 mg/dL (ref 5–40)

## 2018-10-21 LAB — PSA: Prostate Specific Ag, Serum: 0.5 ng/mL (ref 0.0–4.0)

## 2018-10-24 ENCOUNTER — Ambulatory Visit: Payer: 59 | Admitting: Cardiovascular Disease

## 2018-10-24 ENCOUNTER — Encounter: Payer: Self-pay | Admitting: Cardiovascular Disease

## 2018-10-24 ENCOUNTER — Other Ambulatory Visit: Payer: Self-pay | Admitting: *Deleted

## 2018-10-24 VITALS — BP 130/74 | HR 65 | Ht 69.3 in | Wt 245.8 lb

## 2018-10-24 DIAGNOSIS — E1169 Type 2 diabetes mellitus with other specified complication: Secondary | ICD-10-CM | POA: Diagnosis not present

## 2018-10-24 DIAGNOSIS — I4819 Other persistent atrial fibrillation: Secondary | ICD-10-CM | POA: Diagnosis not present

## 2018-10-24 DIAGNOSIS — I5022 Chronic systolic (congestive) heart failure: Secondary | ICD-10-CM | POA: Diagnosis not present

## 2018-10-24 DIAGNOSIS — E785 Hyperlipidemia, unspecified: Secondary | ICD-10-CM

## 2018-10-24 NOTE — Progress Notes (Signed)
Cardiology Office Note:    Date:  10/24/2018   ID:  William Barges., DOB 11-13-1960, MRN [id number]  PCP:  Denita Lung, MD  Cardiologist:  Acie Fredrickson   / [place]  Electrophysiologist:  None   Referring MD: Denita Lung, MD   1.   PFO closure 2.  Atrial fib 3.   Chief Complaint  Patient presents with  . Congestive Heart Failure     Jan.  6, 2020    [name]. is a 58 y.o. male with a hx of persistent atrial fibrillation, dyslipidemia, obstructive sleep apnea He has been seen in the past by Dr. [name].  He is seen currently by Dr. Thompson Grayer and was furred to me for general cardiology issues.  Hx of HTN, PFO - s/p occlusion device.   Hx of CHF  Hx of OSA    Seen with his wife , [name]  Has seen Dr. Einar Gip in the past  Has a PFO occlusion device following a TIA   Has had bilateral knee replacements ,  Both in Oct. 2019.   Dr. Rayann Heman increased his Coreg to 25  BID ,  Has some fatigue related to this increase  Tolerated the lower dose better .   Is on crutches today ,  Typically gets 7000-8000 steps a day   No CP, no dyspnea,  No sweats, no  PND.  No fever, rash, Sleeps well with CPAP.    Works as a Clinical biochemist   Oncologist and [place] )  His son played [place] with my son   Feb. 24, 2020:  [name] is seen today for follow-up of his atrial fibrillation, dyslipidemia struct of sleep apnea.  Has had bilateral knee replacement  Has has an infected right  knee joint ( MSSA ) .   Had a right knee poly exchange  Had 6 weeks of Ancef.     Is on Rifampin for 6 months  Is on Lovenox for now .   Does not want to do coumadin  Doesn't think he wants to go through rehab for his last knee surgery  Has been started on Jardiance. Has lost 50 lbs since Oct.   Past Medical History:  Diagnosis Date  . Arthritis   . Diverticulosis   . Dyslipidemia   . GERD (gastroesophageal reflux disease)   . Herpes labialis   . Hypertension   . Longstanding  persistent atrial fibrillation   . Metabolic syndrome   . Obesity   . Proteinuria   . Psoriasis   . Seborrheic dermatitis   . Sleep apnea    very compliant with CPAP  . TIA (transient ischemic attack) 2009    Past Surgical History:  Procedure Laterality Date  . CARDIOVERSION N/A 12/02/2016   Procedure: CARDIOVERSION;  Surgeon: Pixie Casino, MD;  Location: [facility] ENDOSCOPY;  Service: Cardiovascular;  Laterality: N/A;  . COLONOSCOPY    . I&D KNEE WITH POLY EXCHANGE Right 09/10/2018   Procedure: Right Knee Arthroplasty IRRIGATION AND DEBRIDEMENT KNEE WITH POLY EXCHANGE;  Surgeon: [name], MD;  Location: [facility] ORS;  Service: Orthopedics;  Laterality: Right;  . JOINT REPLACEMENT  11/03/10   LT HIP  . PFO occluder cardiac valve  2006   Dr. Einar Gip, (hole in heart)  . TONSILLECTOMY AND ADENOIDECTOMY    . TOTAL HIP ARTHROPLASTY Left   . TOTAL KNEE ARTHROPLASTY Right 05/31/2018   Procedure: RIGHT TOTAL KNEE ARTHROPLASTY;  Surgeon: [name], MD;  Location:  [facility] ORS;  Service: Orthopedics;  Laterality: Right;  70 mins  . TOTAL KNEE ARTHROPLASTY Left 06/28/2018   Procedure: LEFT TOTAL KNEE ARTHROPLASTY;  Surgeon: [name], MD;  Location: [facility] ORS;  Service: Orthopedics;  Laterality: Left;  70 mins  . WISDOM TOOTH EXTRACTION      Current Medications: Current Meds  Medication Sig  . ACCU-CHEK GUIDE test strip   . carvedilol (COREG) 12.5 MG tablet Take 1 tablet (12.5 mg total) by mouth 2 (two) times daily.  . cephALEXin (KEFLEX) 500 MG capsule Take 1 capsule (500 mg total) by mouth 3 (three) times daily.  . clobetasol (TEMOVATE) 0.05 % external solution Apply 1 application topically 2 (two) times daily as needed (for psoriasis).   Marland Kitchen docusate sodium (COLACE) 100 MG capsule Colace 100 mg capsule   100 mg by oral route.  . empagliflozin (JARDIANCE) 10 MG TABS tablet Take 10 mg by mouth daily.  Marland Kitchen enoxaparin (LOVENOX) 100 MG/ML injection Inject 1 mL (100 mg total) into the skin every 12 (twelve)  hours.  . Ixekizumab (TALTZ) 80 MG/ML SOAJ Inject 80 mg into the skin every 30 (thirty) days.  . methocarbamol (ROBAXIN) 500 MG tablet Take 1 tablet (500 mg total) by mouth every 6 (six) hours as needed for muscle spasms.  Marland Kitchen oxymetazoline (AFRIN) 0.05 % nasal spray Place 1 spray into both nostrils at bedtime.  . pantoprazole (PROTONIX) 40 MG tablet Take 1 tablet (40 mg total) by mouth 2 (two) times daily.  . rifampin (RIFADIN) 300 MG capsule Take 2 capsules (600 mg total) by mouth daily.  . rosuvastatin (CRESTOR) 10 MG tablet Take 1 tablet (10 mg total) by mouth daily.  . Vitamin D, Cholecalciferol, 1000 units CAPS Take 1,000 Units by mouth daily.      Allergies:   Morphine and related   Social History   Socioeconomic History  . Marital status: Married    Spouse name: Not on file  . Number of children: 2  . Years of education: Not on file  . Highest education level: Not on file  Occupational History  . Occupation: Theatre manager: [employer]  . Financial resource strain: Not on file  . Food insecurity:    Worry: Not on file    Inability: Not on file  . Transportation needs:    Medical: Not on file    Non-medical: Not on file  Tobacco Use  . Smoking status: Never Smoker  . Smokeless tobacco: Never Used  Substance and Sexual Activity  . Alcohol use: Yes    Comment: 2/month  . Drug use: No  . Sexual activity: Yes  Lifestyle  . Physical activity:    Days per week: Not on file    Minutes per session: Not on file  . Stress: Not on file  Relationships  . Social connections:    Talks on phone: Not on file    Gets together: Not on file    Attends religious service: Not on file    Active member of club or organization: Not on file    Attends meetings of clubs or organizations: Not on file    Relationship status: Not on file  Other Topics Concern  . Not on file  Social History Narrative   Lives with spouse in [place]   Wife works for  [employer] teaching program   Owns Advertising copywriter     Family History: The patient's family history includes Cancer in  an other family member; Crohn's disease in his mother; Esophageal cancer in his father; Obesity in an other family member; Uterine cancer (age of onset: 34) in his mother.  ROS:   Please see the history of present illness.     All other systems reviewed and are negative.  EKGs/Labs/Other Studies Reviewed:    The following studies were reviewed today:   EKG:     Recent Labs: 09/12/2018: Hemoglobin 13.6; Platelets 217 10/20/2018: ALT 20; BUN 15; Creatinine, Ser 0.90; Potassium 4.5; Sodium 138  Recent Lipid Panel    Component Value Date/Time   CHOL 141 10/20/2018 0945   TRIG 142 10/20/2018 0945   HDL 42 10/20/2018 0945   CHOLHDL 3.4 10/20/2018 0945   CHOLHDL 4.1 10/13/2016 0956   VLDL 65 (H) 10/13/2016 0956   LDLCALC 71 10/20/2018 0945    Physical Exam:     Physical Exam: Blood pressure 130/74, pulse 65, height 5' 9.3" (1.76 m), weight 245 lb 12.8 oz (111.5 kg), SpO2 97 %.  GEN:    Middle age male, NAD  HEENT: Normal NECK: No JVD; No carotid bruits LYMPHATICS: No lymphadenopathy CARDIAC: irreg.  Irreg.  RESPIRATORY:  Clear to auscultation without rales, wheezing or rhonchi  ABDOMEN: Soft, non-tender, non-distended MUSCULOSKELETAL:  No edema; No deformity  SKIN: Warm and dry NEUROLOGIC:  Alert and oriented x 3     ASSESSMENT:    1. Persistent atrial fibrillation   2. Chronic systolic CHF (congestive heart failure) ([place])   3. Hyperlipidemia associated with type 2 diabetes mellitus ([place])    PLAN:    In order of problems listed above:  1. Chronic systolic congestive heart failure: [name] is seen today for follow-up visit for his chronic systolic congestive heart failure. We tried him on Eliquis but he had GI issues and discontinued it. On carvedilol. Point I would would like for him to get through this knee joint infection and off the  rifampin.  I will see him again in 6 months and will restart an ARB at that time.  Will also consider Spironolactone at a later time  Continue weight loss efforts.   2.  Atrial fibrillation:  Has chronic atrial fibrillation.  He is on rifampin so his Xarelto had to be discontinued.  He did not want to start Coumadin.  He is on Lovenox injections and will continue Lovenox for the next 4 months. We will asked Dr. Megan Salon to restart his Xarelto when he is finished with his rifampin therapy.   3.  Hyperlipidemia: Continue atorvastatin.  Will recheck labs when I see him again in 6 months.     Medication Adjustments/Labs and Tests Ordered: Current medicines are reviewed at length with the patient today.  Concerns regarding medicines are outlined above.  Orders Placed This Encounter  Procedures  . Lipid Profile  . Basic Metabolic Panel (BMET)  . Hepatic function panel   No orders of the defined types were placed in this encounter.   Patient Instructions  Medication Instructions:  Your physician recommends that you continue on your current medications as directed. Please refer to the Current Medication list given to you today.  If you need a refill on your cardiac medications before your next appointment, please call your pharmacy.   Lab work: Your physician recommends that you return for lab work in: 6 months on the day of or a few days before your office visit with Dr. Acie Fredrickson.  You will need to FAST for this appointment - nothing to  eat or drink after midnight the night before except water.    Testing/Procedures: None Ordered   Follow-Up: At [facility], you and your health needs are our priority.  As part of our continuing mission to provide you with exceptional heart care, we have created designated Provider Care Teams.  These Care Teams include your primary Cardiologist (physician) and Advanced Practice Providers (APPs -  Physician Assistants and Nurse Practitioners) who all  work together to provide you with the care you need, when you need it. You will need a follow up appointment in:  6 months.  Please call our office 2 months in advance to schedule this appointment.  You may see Mertie Moores, MD or one of the following Advanced Practice Providers on your designated Care Team: Richardson Dopp, PA-C [place], [place] . Daune Perch, NP       Signed, Mertie Moores, MD  10/24/2018 9:07 AM    [place]

## 2018-10-24 NOTE — Patient Instructions (Signed)
Medication Instructions:  Your physician recommends that you continue on your current medications as directed. Please refer to the Current Medication list given to you today.  If you need a refill on your cardiac medications before your next appointment, please call your pharmacy.   Lab work: Your physician recommends that you return for lab work in: 6 months on the day of or a few days before your office visit with Dr. [name].  You will need to FAST for this appointment - nothing to eat or drink after midnight the night before except water.    Testing/Procedures: None Ordered   Follow-Up: At [facility], you and your health needs are our priority.  As part of our continuing mission to provide you with exceptional heart care, we have created designated Provider Care Teams.  These Care Teams include your primary Cardiologist (physician) and Advanced Practice Providers (APPs -  Physician Assistants and Nurse Practitioners) who all work together to provide you with the care you need, when you need it. You will need a follow up appointment in:  6 months.  Please call our office 2 months in advance to schedule this appointment.  You may see Philip [name], MD or one of the following Advanced Practice Providers on your designated Care Team: Scott Weaver, PA-C Vin Bhagat, PA-C . Janine Hammond, NP   

## 2018-10-26 DIAGNOSIS — Z471 Aftercare following joint replacement surgery: Secondary | ICD-10-CM | POA: Diagnosis not present

## 2018-10-26 DIAGNOSIS — Z96651 Presence of right artificial knee joint: Secondary | ICD-10-CM | POA: Diagnosis not present

## 2018-10-26 DIAGNOSIS — Z96652 Presence of left artificial knee joint: Secondary | ICD-10-CM | POA: Diagnosis not present

## 2018-10-27 MED FILL — ACCU-CHEK GUIDE STRP: 50 days supply | Qty: 100 | Fill #4

## 2018-11-05 MED FILL — ENTRESTO 24 MG-26 MG TABLET: 24-26 | 30 days supply | Qty: 60 | Fill #1 | Status: TO

## 2018-11-07 ENCOUNTER — Ambulatory Visit: Payer: 59 | Admitting: Internal Medicine

## 2018-11-08 ENCOUNTER — Telehealth: Payer: Self-pay | Admitting: Medical

## 2018-11-08 ENCOUNTER — Encounter: Payer: Self-pay | Admitting: Medical

## 2018-11-08 ENCOUNTER — Ambulatory Visit (INDEPENDENT_AMBULATORY_CARE_PROVIDER_SITE_OTHER): Payer: Self-pay | Admitting: Medical

## 2018-11-08 VITALS — BP 110/70 | HR 80 | Temp 98.0°F | Resp 16 | Ht 69.25 in | Wt 243.2 lb

## 2018-11-08 DIAGNOSIS — E1121 Type 2 diabetes mellitus with diabetic nephropathy: Secondary | ICD-10-CM

## 2018-11-08 DIAGNOSIS — Z8673 Personal history of transient ischemic attack (TIA), and cerebral infarction without residual deficits: Secondary | ICD-10-CM

## 2018-11-08 DIAGNOSIS — E1159 Type 2 diabetes mellitus with other circulatory complications: Secondary | ICD-10-CM

## 2018-11-08 DIAGNOSIS — I152 Hypertension secondary to endocrine disorders: Secondary | ICD-10-CM

## 2018-11-08 DIAGNOSIS — Z79899 Other long term (current) drug therapy: Secondary | ICD-10-CM

## 2018-11-08 DIAGNOSIS — L4050 Arthropathic psoriasis, unspecified: Secondary | ICD-10-CM

## 2018-11-08 DIAGNOSIS — Z96651 Presence of right artificial knee joint: Secondary | ICD-10-CM

## 2018-11-08 DIAGNOSIS — I4821 Permanent atrial fibrillation: Secondary | ICD-10-CM

## 2018-11-08 DIAGNOSIS — Z0289 Encounter for other administrative examinations: Secondary | ICD-10-CM

## 2018-11-08 DIAGNOSIS — I10 Essential (primary) hypertension: Secondary | ICD-10-CM

## 2018-11-08 DIAGNOSIS — G4733 Obstructive sleep apnea (adult) (pediatric): Secondary | ICD-10-CM

## 2018-11-08 LAB — POCT URINALYSIS DIP (PROADVANTAGE DEVICE)
Bilirubin, UA: NEGATIVE
Blood, UA: NEGATIVE
Ketones, POC UA: NEGATIVE mg/dL
Leukocytes, UA: NEGATIVE
Nitrite, UA: NEGATIVE
Protein Ur, POC: 100 mg/dL — AB
Specific Gravity, Urine: 1.025
Urobilinogen, Ur: NEGATIVE
pH, UA: 6 (ref 5.0–8.0)

## 2018-11-08 NOTE — Telephone Encounter (Signed)
Hi [name]  He appears to be doing very well and he may drive from a cardiac standpoint   Phil

## 2018-11-08 NOTE — Telephone Encounter (Signed)
Hello Dr. 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  He obviously has had a recent significant issue with knee surgery followed by an infection.  Would you please comment on whether he is clear to drive from a DOT certification standpoint?  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  For example he would have to be able to use his legs to apply the brakes effectively and suddenly.  Thanks for your help.  Dorothea Ogle, PA-C

## 2018-11-08 NOTE — Telephone Encounter (Signed)
Hello Dr. 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   He is obviously dealing with the knee issue and infection, but from a cardiac standpoint is he safe to drive regarding DOT clearance given his Afib, CHF?  Thanks Dorothea Ogle, PA-C

## 2018-11-08 NOTE — Progress Notes (Signed)
William D Rennie Jr. is a 58 y.o. male who presents today for a commercial driver fitness determination physical exam.  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   Last DOT physical 2019 here with me.  Medical care team includes:  Dr. Redmond School here for primary care since 1976.  Review of Systems A comprehensive review of systems was reviewed and noted as below:  Eye: +uses reading glasses, but otherwise no corrective lenses, -lasik surgery or other eye surgery, -glaucoma, -cataracts, -macular degeneration, -monocular vision, -medication for eye condition, -blurred vision,   Ears: -hearing problems, - hearing aids, -ear pain, -ear drainage, -ear fullness, -tinnitus, -recurrent ear infection, -previous ear surgery, - vertigo, -meniere's disease  Endocrine: -polydipsia, -polyuria, -intentional weight loss, -fainting, -dizziness, - altered or loss of consciousness, -hypoglycemia  Cardiovascular: +heart disease, +CHF, -heart attack, -cardiac stents, -bypass surgery, +other heart surgery/prior procedure for PFO, +hypertension, -blood clots, -pacemaker, -medications for heart condition, -chest pain, -SOB, -palpitations, -fainting, -dizziness, -dyspnea  Respiratory: -asthma, -COPD, other lung disease, -smoker, -chest tightness, - wheezing, -snoring, -daytime sleepiness, +sleep apnea/uses CPAP, -narcolepsy  Allergy: -uncontrollable sneezing or allergy symptoms  Musculoskeletal: -missing body parts, -muscle disease, -bone disease, -spine injury, -low back pain, -medication for joints, bones, muscles or pain, -physical limitations, -joint pain, -neck pain, -limitations of neck ROM, -back surgery, +left hip replacement,  +hx/o psoriasis, -gout  Neurologic: -neurologic disease, -dementia, -seizures, -parkinson's, -tremor, -memory problems,  -weakness, -numbness, -tingling, -medication for neurologic condition, -medications for sleep condition  Gastric: -abdominal pain, -chronic diarrhea or IBS, -uncontrollable nausea  Kidney/Renal: -hematuria, -dialysis, kidney disease, polycystic kidney disease  Psychiatric: -homicidal thoughts, -suicidal thoughts, -prior suicide attempts, -get into fights/hurting others, -memory or concentration problems, -delusions, -hallucinations, -hospitalization for mental health problem, -depression, -anxiety, -bipolar  Drug use: - none  He had total knee replacement this past year with subsequent knee infection, has been dealing with this.  Medications had to be changed including his anticoagulant given the antibiotic choices.  Currently being managed by orthopedics, infectious disease, and recently saw his cardiologist  He is improving.   Still gets pain in right knee some, but improving with swelling and pain.  Compliant with therapy.  Reviewed their medical, surgical, family, social, medication, and allergy history and updated chart as appropriate.     Objective:   Physical Exam  BP 110/70   Pulse 80   Temp 98 F (36.7 C) (Oral)   Resp 16   Ht 5' 9.25" (1.759 m)   Wt 243 lb 3.2 oz (110.3 kg)   SpO2 98%   BMI 35.66 kg/m   Wt Readings from Last 3 Encounters:  11/08/18 243 lb 3.2 oz (110.3 kg)  10/24/18 245 lb 12.8 oz (111.5 kg)  10/20/18 244 lb 6.4 oz (110.9 kg)   BP Readings from Last 3 Encounters:  11/08/18 110/70  10/24/18 130/74  10/20/18 128/76    General appearance: alert, no distress, WD/WN Skin: no worrisome findings otherwise , no warmth or redness of right knee HEENT: normocephalic, conjunctiva/corneas normal, sclerae anicteric, PERRLA, EOMi, nares patent, no discharge or erythema, pharynx normal Oral cavity: MMM, tongue normal, teeth in good repair Neck: supple, no lymphadenopathy, no thyromegaly, no masses, normal ROM, no bruits Chest: non tender, normal shape and  expansion Heart: RRR, normal S1, S2, no murmurs Lungs: CTA bilaterally, no wheezes, rhonchi, or rales Abdomen: +bs, soft,  obese, non tender, non distended, no masses, no hepatomegaly, no splenomegaly, no bruits Back: non tender, normal ROM, no scoliosis Musculoskeletal: bilat knee surgical scars, linearly vertically but right knee with mild to moderate swelling, pain noted with standing, otherwise left buttock/posterior hip surgical scar, left forearm dorsal with diagonal long scar from childhood trauma, otherwise upper extremities non tender, no obvious deformity, normal ROM throughout, lower extremities non tender, no obvious deformity, normal ROM throughout Extremities: right knee with mild to moderate swelling, otherwise no edema, no cyanosis, no clubbing Pulses: 2+ symmetric, upper and lower extremities, normal cap refill Neurological: normal monofilament exam of feet, alert, oriented x 3, CN2-12 intact, strength normal upper extremities and lower extremities, sensation normal throughout, DTRs 2+ throughout, no cerebellar signs, gait normal Psychiatric: normal affect, behavior normal, pleasant  GU: no hernia Rectal: deferred  Vision, hearing, urinalysis reviewed.    Assessment:   Encounter Diagnoses  Name Primary?  . Health examination of defined subpopulation Yes  . Permanent atrial fibrillation   . Hypertension associated with diabetes ([place])   . Obstructive sleep apnea   . Diabetic nephropathy associated with type 2 diabetes mellitus ([place])   . S/P TKR (total knee replacement), right   . Hx-TIA (transient ischemic attack)   . Psoriatic arthritis ([place])   . High risk medication use     Plan:   DOT evaluation - cleared for 1 year certificate  I reviewed his cardiology notes from 10/24/2018 with Dr. Acie Fredrickson.  He has chronic atrial fibrillation, currently on Lovenox injections.  He was on Xarelto but this had to be changed given that he is currently on rifampin antibiotics.  He also  has diagnoses of chronic systolic congestive heart failure.  Per Dr. Acie Fredrickson, an ARB would probably be started in 6 months.  I have sent a request for clearance from cardiology before I can make determination on his DOT clearance  He had a total knee replacement this past year and had a complication with infection.  I reviewed the most recent orthopedics note in the chart history under care everywhere from 10/26/2018.  At that time the plan was to continue pain medicine, continue oral antibiotics for up to 6 months, continue Lovenox for anticoagulation, home physical therapy program, consistent exercise.  I will request a note from orthopedics on whether he is safe to drive from a DOT certification standpoint  I reviewed his infectious disease note from Dr. Michel Bickers from 10/10/2018 visit.  As of that date he was improving on therapy for MSSA right prosthetic knee infection.  As of that time he was to complete IV cefazolin and have PICC line removed on 10/20/2018.  He was started on oral cephalexin and oral rifampin continued.  He has follow-up planned in 6 weeks with infectious disease  I reviewed his hemoglobin A1c from 10/20/2018 which was 5.2%, as well as other recent labs.  In general he seems to be stable in regards to blood pressure, diabetes, and is compliant with CPAP.  History of TIA with repair of PFO  Addendum: Cleared from cardiology standpoint by Dr. Ellyn Hack from orthopedic standpoint by Dr. Alvan Dame  F/u with Dr. Redmond School regarding proteinuria, follow-up with his other specialist as planned

## 2018-11-09 ENCOUNTER — Telehealth: Payer: Self-pay

## 2018-11-09 ENCOUNTER — Telehealth: Payer: Self-pay | Admitting: Medical

## 2018-11-09 NOTE — Telephone Encounter (Signed)
Nurse called from [facility] and stated that Dr. Alvan Dame has cleared patient for knee replacement.

## 2018-11-09 NOTE — Telephone Encounter (Signed)
I have completed my portions of the updated DOT certificate and forms.   Copy all forms, and have the patient return for the originals.  He needs to send copy of " medical certificate" to [facility], and I have already uploaded info to the federal website.

## 2018-11-10 MED FILL — CARVEDILOL 25 MG TABLET: 25 | 90 days supply | Qty: 180 | Fill #1

## 2018-11-10 MED FILL — ENOXAPARIN 100 MG/ML SYR: 100 | 30 days supply | Qty: 60 | Fill #1 | Status: TO

## 2018-11-10 MED FILL — PANTOPRAZOLE SOD DR 40 MG T: 40 | 90 days supply | Qty: 180 | Fill #0

## 2018-11-10 NOTE — Telephone Encounter (Signed)
Copies made.  Left message on voicemail for patient to come and pick up DOT PE forms

## 2018-11-16 MED FILL — JARDIANCE 10 MG TABLET: 10 | 90 days supply | Qty: 90 | Fill #1

## 2018-11-16 MED FILL — TALTZ 80 MG/ML AUTOINJECTOR: 80 | 30 days supply | Qty: 1 | Fill #3 | Status: TO

## 2018-11-23 DIAGNOSIS — L814 Other melanin hyperpigmentation: Secondary | ICD-10-CM | POA: Diagnosis not present

## 2018-11-23 DIAGNOSIS — D1801 Hemangioma of skin and subcutaneous tissue: Secondary | ICD-10-CM | POA: Diagnosis not present

## 2018-11-23 DIAGNOSIS — Z85828 Personal history of other malignant neoplasm of skin: Secondary | ICD-10-CM | POA: Diagnosis not present

## 2018-11-23 DIAGNOSIS — D2262 Melanocytic nevi of left upper limb, including shoulder: Secondary | ICD-10-CM | POA: Diagnosis not present

## 2018-11-23 DIAGNOSIS — L821 Other seborrheic keratosis: Secondary | ICD-10-CM | POA: Diagnosis not present

## 2018-11-23 DIAGNOSIS — D2261 Melanocytic nevi of right upper limb, including shoulder: Secondary | ICD-10-CM | POA: Diagnosis not present

## 2018-11-23 DIAGNOSIS — D2362 Other benign neoplasm of skin of left upper limb, including shoulder: Secondary | ICD-10-CM | POA: Diagnosis not present

## 2018-11-23 DIAGNOSIS — D225 Melanocytic nevi of trunk: Secondary | ICD-10-CM | POA: Diagnosis not present

## 2018-11-23 DIAGNOSIS — Z79899 Other long term (current) drug therapy: Secondary | ICD-10-CM | POA: Diagnosis not present

## 2018-11-24 DIAGNOSIS — G4733 Obstructive sleep apnea (adult) (pediatric): Secondary | ICD-10-CM | POA: Diagnosis not present

## 2018-12-01 ENCOUNTER — Ambulatory Visit: Payer: 59 | Admitting: Internal Medicine

## 2018-12-05 IMAGING — MR MR KNEE*R* W/O CM
6 series · 40 of 40 positions shown · non-contrast
Comparison: Radiographs 03/05/2018

CLINICAL DATA: Chronic knee pain.

EXAM:
MRI OF THE RIGHT KNEE WITHOUT CONTRAST
TECHNIQUE: Multiplanar, multisequence MR imaging of the knee was performed. No
intravenous contrast was administered.

[Series 8: T2 fat-sat · axial · right · 4.0mm · 0.36mm/px · z∈[-51,+92]mm · 5 of 30 slices shown (1 of 3)]
[im 1/30]
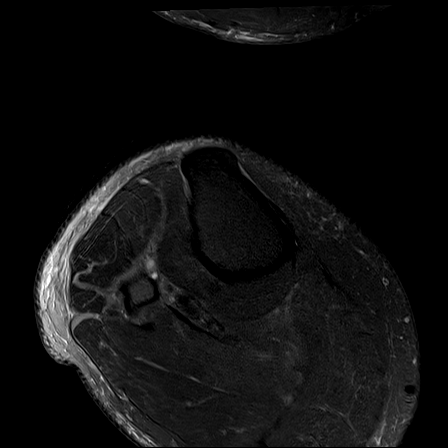
[im 8/30]
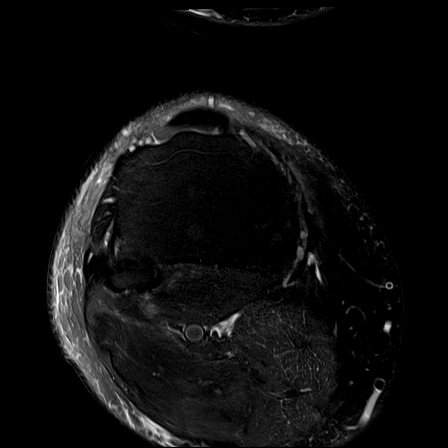
[im 15/30]
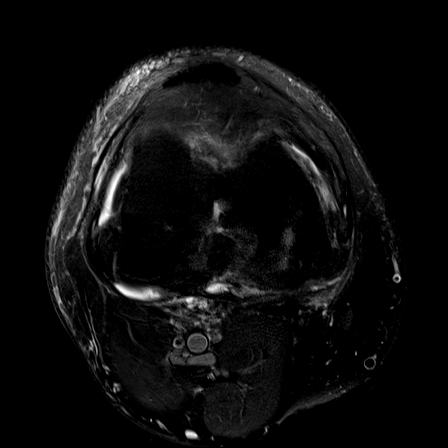
[im 22/30]
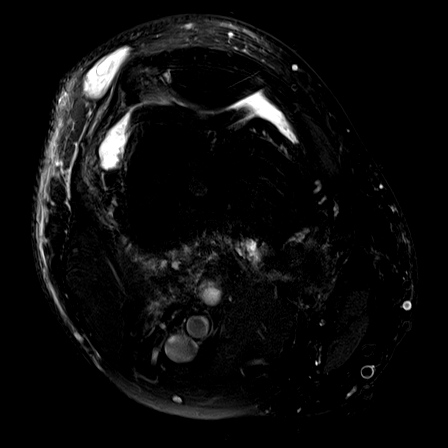
[im 30/30]
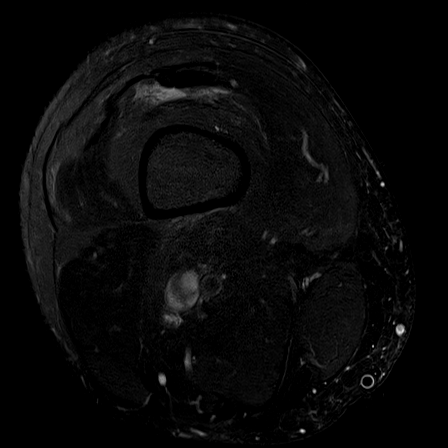

[Series 9: T2 fat-sat · axial · right · 4.0mm · 0.36mm/px · z∈[-52,+92]mm · 6 of 30 slices shown (2 of 3)]
[im 1/30]
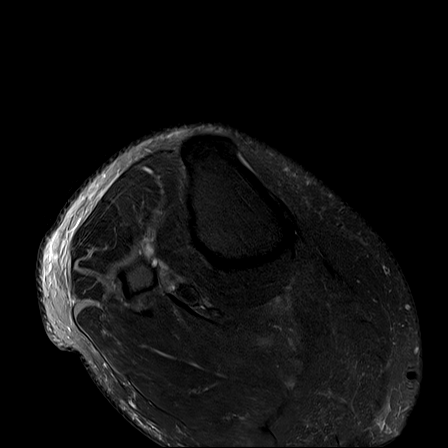
[im 6/30]
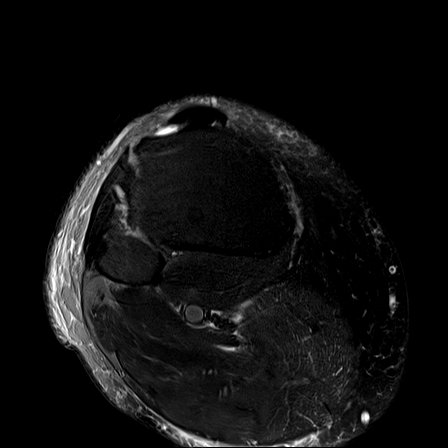
[im 12/30]
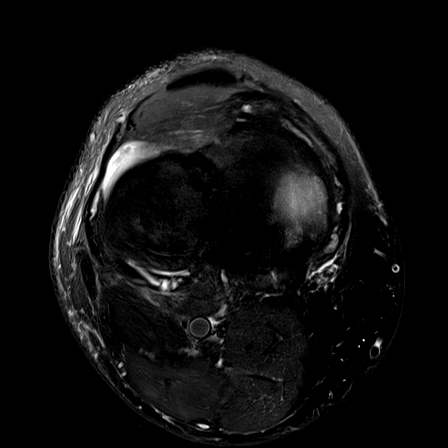
[im 18/30]
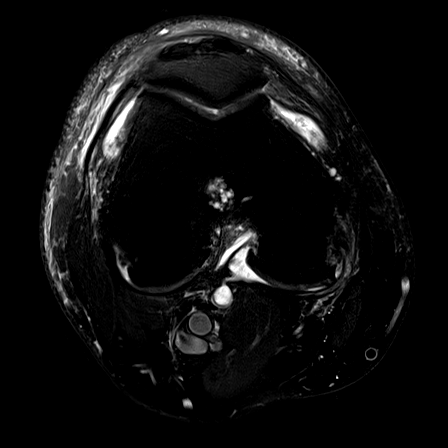
[im 24/30]
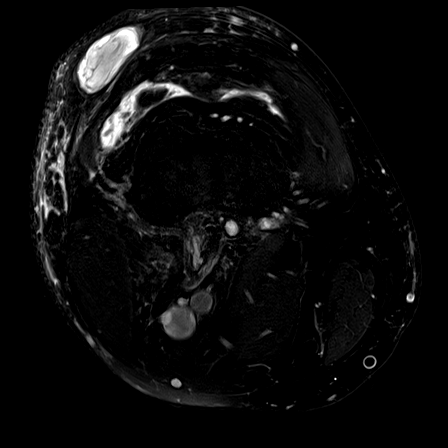
[im 30/30]
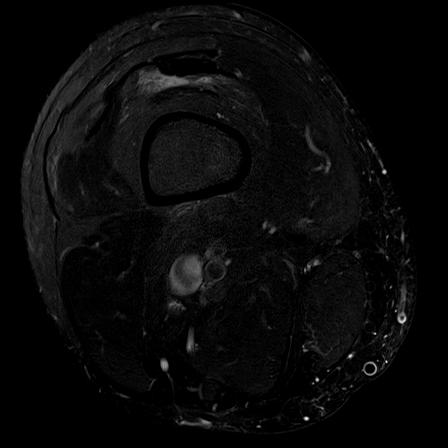

[Series 10: T1 · coronal · right · 4.0mm · 0.49mm/px · 7 of 36 slices shown]
[im 1/36]
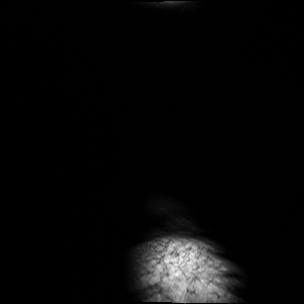
[im 6/36]
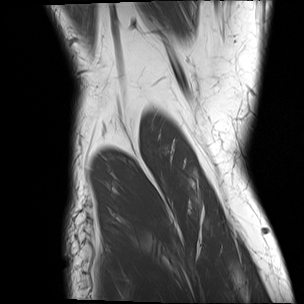
[im 12/36]
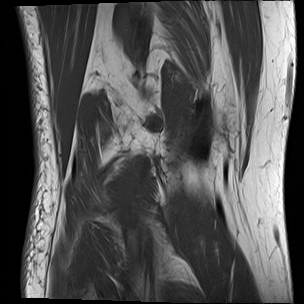
[im 18/36]
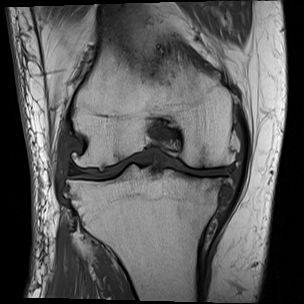
[im 24/36]
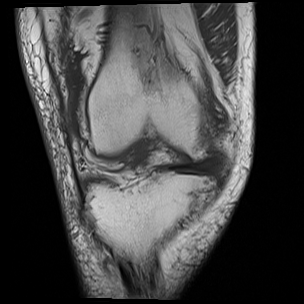
[im 30/36]
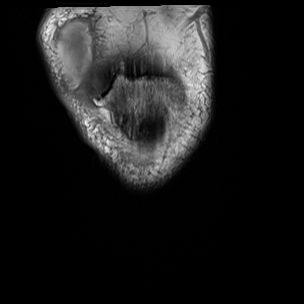
[im 36/36]
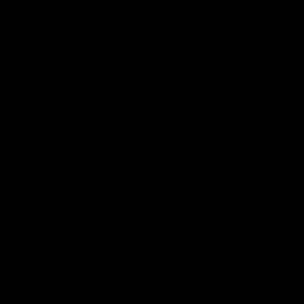

[Series 11: T2 fat-sat · coronal · right · 4.0mm · 0.59mm/px · 7 of 34 slices shown (3 of 3)]
[im 1/34]
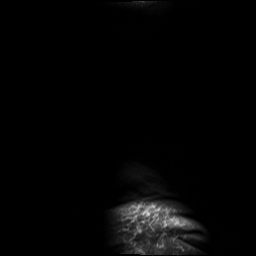
[im 6/34]
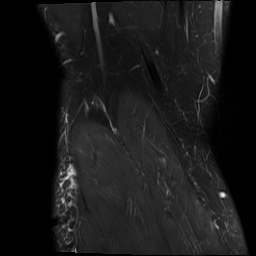
[im 12/34]
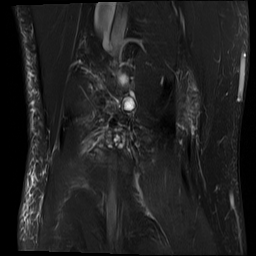
[im 17/34]
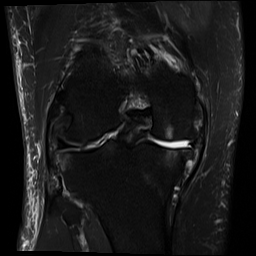
[im 23/34]
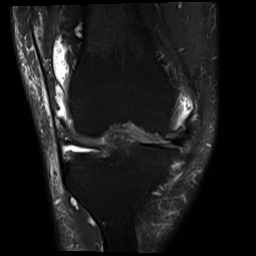
[im 28/34]
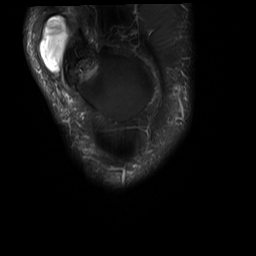
[im 34/34]
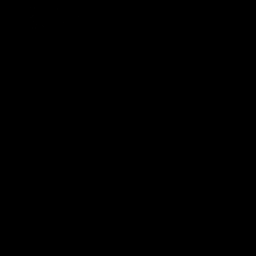

[Series 12: PD fat-sat · coronal · right · 3.0mm · 0.59mm/px · 8 of 40 slices shown (1 of 2)]
[im 1/40]
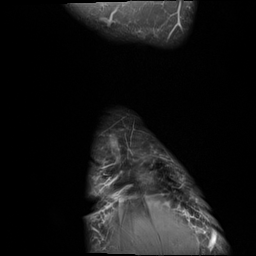
[im 6/40]
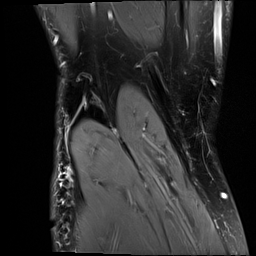
[im 12/40]
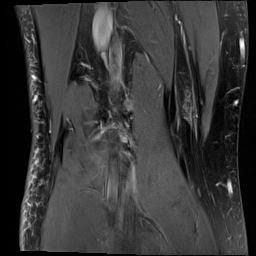
[im 17/40]
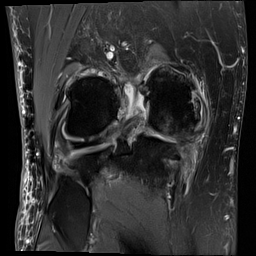
[im 23/40]
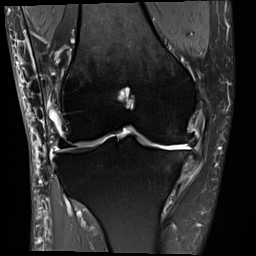
[im 28/40]
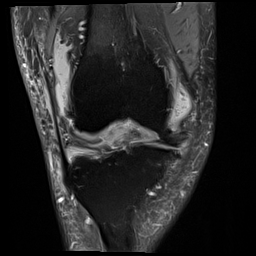
[im 34/40]
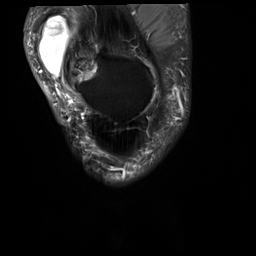
[im 40/40]
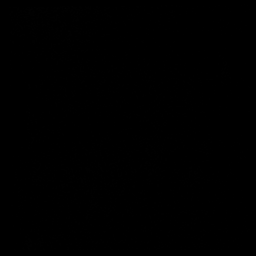

[Series 13: PD fat-sat · sagittal · right · 3.0mm · 0.59mm/px · 7 of 35 slices shown (2 of 2)]
[im 1/35]
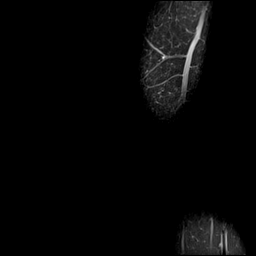
[im 6/35]
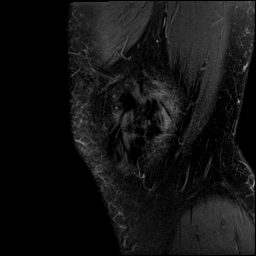
[im 12/35]
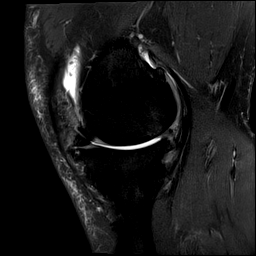
[im 18/35]
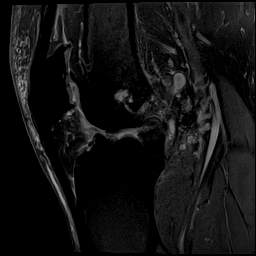
[im 23/35]
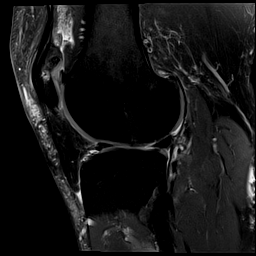
[im 29/35]
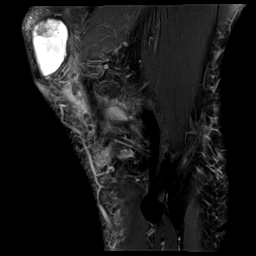
[im 35/35]
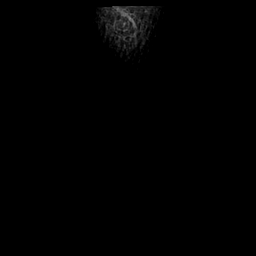

[40 of 40 positions shown; findings below may reference images not displayed]

FINDINGS: MENISCI

Medial meniscus: Severely degenerated and torn. The posterior horn
and midbody regions are macerated. There is only a small rim of
meniscal tissue noted in the midbody region. The anterior horn
appears intact.

Lateral meniscus:  Degenerative changes but no discrete tear.

LIGAMENTS

Cruciates: Chronic ACL deficient knee. The PCL is buckled and
demonstrates abnormal signal intensity but no tear.

Collaterals:  Intact.

CARTILAGE

Patellofemoral: Moderate degenerative chondrosis with mild joint
space narrowing and spurring.

Medial: Severe degenerative chondrosis with full-thickness cartilage
loss, joint space narrowing, spurring and subchondral edema.

Lateral: Moderate to advanced degenerative chondrosis with joint
space narrowing and spurring.

Joint:  Moderate-sized joint effusion and moderate synovitis.

Popliteal Fossa:  No popliteal mass or Baker's cyst.

Extensor Mechanism: The patella retinacular structures are intact
and the quadriceps and patellar tendons are intact.

Bones:  No acute bony findings.  Bipartite patella noted.

Other: Normal knee musculature.

2.7 cm subcutaneous fluid collection superficial to the lateral
patellar retinaculum. This contains areas of increased T1 signal
intensity and is most likely a liquefying hematoma.
IMPRESSION: 1. Severely degenerated and torn medial meniscus.
2. Chronic ACL deficient knee.
3. Advanced tricompartmental degenerative changes most notable in
the medial compartment.
4. Moderate-sized joint effusion and moderate synovitis.
5. 2.7 cm subcutaneous fluid collection laterally, likely liquefying
hematoma.

## 2018-12-08 ENCOUNTER — Other Ambulatory Visit: Payer: Self-pay | Admitting: Family Medicine

## 2018-12-08 MED FILL — ENOXAPARIN 100 MG/ML SYR: 100 | 30 days supply | Qty: 60 | Fill #0

## 2018-12-08 MED FILL — ACCU-CHEK GUIDE TEST STRIP: 90 days supply | Qty: 100 | Fill #0 | Status: TO

## 2018-12-08 MED FILL — TALTZ 80 MG/ML AUTOINJECTOR: 80 | 30 days supply | Qty: 1 | Fill #0

## 2018-12-10 MED FILL — METHOCARBAMOL 500 MG TABLET: 500 | 10 days supply | Qty: 30 | Fill #0

## 2018-12-17 MED FILL — ROSUVASTATIN CALCIUM 10 MG: 10 | 90 days supply | Qty: 90 | Fill #0

## 2018-12-20 NOTE — Telephone Encounter (Signed)
He is cleared to perform all activities including driving without restriction  Thank you Alvan Dame

## 2019-01-02 MED FILL — TALTZ 80 MG/ML AUTOINJECTOR: 80 | 30 days supply | Qty: 1 | Fill #1 | Status: TO

## 2019-01-02 MED FILL — rifAMPin 300 MG CAPS: 300 | 90 days supply | Qty: 180 | Fill #0

## 2019-01-02 MED FILL — ENOXAPARIN 100 MG/ML SYR: 100 | 30 days supply | Qty: 60 | Fill #1 | Status: TO

## 2019-01-02 MED FILL — ENTRESTO 24 MG-26 MG TABLET: 24-26 | 30 days supply | Qty: 60 | Fill #0

## 2019-01-04 ENCOUNTER — Encounter: Payer: Self-pay | Admitting: Family Medicine

## 2019-01-06 ENCOUNTER — Other Ambulatory Visit (INDEPENDENT_AMBULATORY_CARE_PROVIDER_SITE_OTHER): Payer: 59

## 2019-01-06 ENCOUNTER — Telehealth: Payer: Self-pay | Admitting: Internal Medicine

## 2019-01-06 ENCOUNTER — Other Ambulatory Visit: Payer: Self-pay

## 2019-01-06 DIAGNOSIS — E1121 Type 2 diabetes mellitus with diabetic nephropathy: Secondary | ICD-10-CM

## 2019-01-06 LAB — POCT GLYCOSYLATED HEMOGLOBIN (HGB A1C): Hemoglobin A1C: 5.8 % — AB (ref 4.0–5.6)

## 2019-01-06 NOTE — Progress Notes (Unsigned)
Pt was advised that his insurance may not pay for A1C since it has been less than 3 months. Pt was ok with getting a bill. It looks to be around $9.00.

## 2019-01-06 NOTE — Telephone Encounter (Signed)
Pt came in for A1c today and a1c is 5.8. he started taking jardiance back in February and that's when he noticed a upper trend of blood sugars. Please advise what you want to do. Pt was notified it would be Monday before he got a call

## 2019-01-17 ENCOUNTER — Other Ambulatory Visit: Payer: Self-pay | Admitting: Internal Medicine

## 2019-01-17 DIAGNOSIS — Z96659 Presence of unspecified artificial knee joint: Secondary | ICD-10-CM

## 2019-01-17 DIAGNOSIS — T8459XD Infection and inflammatory reaction due to other internal joint prosthesis, subsequent encounter: Secondary | ICD-10-CM

## 2019-01-17 MED ORDER — RIFAMPIN 300 MG PO CAPS: 600.0000 mg | ORAL_CAPSULE | Freq: Every day | ORAL | 2 refills | Status: DC

## 2019-01-17 MED ORDER — CEPHALEXIN 500 MG PO CAPS: 500.0000 mg | ORAL_CAPSULE | Freq: Three times a day (TID) | ORAL | 2 refills | Status: DC

## 2019-01-17 MED ORDER — ENOXAPARIN SODIUM 100 MG/ML ~~LOC~~ SOLN: 100.0000 mg | Freq: Two times a day (BID) | SUBCUTANEOUS | 5 refills | Status: DC

## 2019-01-25 ENCOUNTER — Encounter: Payer: Self-pay | Admitting: Family Medicine

## 2019-01-25 MED FILL — CEPHALEXIN 500 MG CAPSULE: 500 | 90 days supply | Qty: 270 | Fill #1

## 2019-02-06 MED FILL — ENOXAPARIN 100 MG/ML SYR: 100 | 30 days supply | Qty: 60 | Fill #0

## 2019-02-06 MED FILL — PANTOPRAZOLE SOD DR 40 MG T: 40 | 90 days supply | Qty: 180 | Fill #1

## 2019-02-06 MED FILL — JARDIANCE 10 MG TABLET: 10 | 60 days supply | Qty: 60 | Fill #2

## 2019-02-06 MED FILL — TALTZ 80 MG/ML AUTOINJECTOR: 80 | 30 days supply | Qty: 1 | Fill #0

## 2019-02-20 MED FILL — ACCU-CHEK GUIDE TEST STRIP: 50 days supply | Qty: 100 | Fill #0

## 2019-02-27 ENCOUNTER — Ambulatory Visit (INDEPENDENT_AMBULATORY_CARE_PROVIDER_SITE_OTHER): Payer: 59 | Admitting: Pharmacist

## 2019-02-27 ENCOUNTER — Other Ambulatory Visit: Payer: Self-pay

## 2019-02-27 DIAGNOSIS — Z79899 Other long term (current) drug therapy: Secondary | ICD-10-CM

## 2019-02-27 MED FILL — CARVEDILOL 25 MG TABLET: 25 | 90 days supply | Qty: 180 | Fill #2

## 2019-02-27 NOTE — Progress Notes (Signed)
S: Patient presents today for review of his specialty medication.  Patient is currently taking Taltz for psoriatric arthritis. Patient is managed by Dr. Elvera Lennox for this. Has failed Stelara and Otezla.  Adherence: denies any missed doses.  Efficacy: reports that it is still working very well for him.   Dosing:  SubQ:  80 mg every 4 weeks.  Drug-drug interactions: none  Screening: TB test: completed yearly, denies any positive test results Hepatitis: completed  Monitoring: S/sx of infection: has had a prosthetic joint infection. Has continued on Taltz throughout this with no issues. CBC: see below S/sx of hypersensitivity: denies S/sx of malignancy: denies S/sx of heart failure: denies   O:     Lab Results  Component Value Date   WBC 6.1 09/12/2018   HGB 13.6 09/12/2018   HCT 39.7 09/12/2018   MCV 87.3 09/12/2018   PLT 217 09/12/2018      Chemistry      Component Value Date/Time   NA 138 10/20/2018 0945   NA 138 05/22/2015 1303   K 4.5 10/20/2018 0945   K 4.1 05/22/2015 1303   CL 103 10/20/2018 0945   CO2 18 (L) 10/20/2018 0945   CO2 23 05/22/2015 1303   BUN 15 10/20/2018 0945   BUN 17.4 05/22/2015 1303   CREATININE 0.90 10/20/2018 0945   CREATININE 1.21 11/24/2016 0946   CREATININE 1.1 05/22/2015 1303      Component Value Date/Time   CALCIUM 9.3 10/20/2018 0945   CALCIUM 9.4 05/22/2015 1303   ALKPHOS 66 10/20/2018 0945   ALKPHOS 73 05/22/2015 1303   AST 26 10/20/2018 0945   AST 24 05/22/2015 1303   ALT 20 10/20/2018 0945   ALT 30 05/22/2015 1303   BILITOT 1.0 10/20/2018 0945   BILITOT 0.77 05/22/2015 1303       A/P: 1. Medication review: patient is on Taltz and tolerating it well without any adverse effects. He was in an original trial for Donnetta Hail and has continued on it and hopes to continue on it long term since it has worked so well for him. Has had a prosthetic joint infection but feels like it is resolving and is hoping to be off treatment in  the coming weeks.  Reviewed the medication, including injection technique, adverse effects, and efficacy. No recommendations for changes. Patient to follow up with Dr. Elvera Lennox as directed.   Christella Hartigan, PharmD, BCPS, BCACP, CPP Clinical Pharmacist Practitioner  [phone]

## 2019-03-13 ENCOUNTER — Other Ambulatory Visit: Payer: Self-pay | Admitting: Family Medicine

## 2019-03-13 MED FILL — ENTRESTO 24 MG-26 MG TABLET: 24-26 | 30 days supply | Qty: 60 | Fill #0

## 2019-03-13 MED FILL — ROSUVASTATIN CALCIUM 10 MG: 10 | 90 days supply | Qty: 90 | Fill #0

## 2019-03-13 MED FILL — ULTICARE ALCOHOL SWABS 70 %: 70 | 50 days supply | Qty: 100 | Fill #3

## 2019-03-13 MED FILL — TALTZ 80 MG/ML AUTOINJECTOR: 80 | 30 days supply | Qty: 1 | Fill #1

## 2019-03-13 MED FILL — ACCU-CHEK FASTCLIX LANCETS: 51 days supply | Qty: 102 | Fill #0

## 2019-03-13 NOTE — Telephone Encounter (Signed)
LVM for pt to call back to see if he needs the lancets for his acc chek

## 2019-03-15 DIAGNOSIS — G4733 Obstructive sleep apnea (adult) (pediatric): Secondary | ICD-10-CM | POA: Diagnosis not present

## 2019-03-16 ENCOUNTER — Ambulatory Visit: Payer: 59 | Admitting: Internal Medicine

## 2019-03-16 ENCOUNTER — Encounter: Payer: Self-pay | Admitting: Internal Medicine

## 2019-03-16 ENCOUNTER — Other Ambulatory Visit: Payer: Self-pay

## 2019-03-16 DIAGNOSIS — Z96659 Presence of unspecified artificial knee joint: Secondary | ICD-10-CM | POA: Diagnosis not present

## 2019-03-16 DIAGNOSIS — T8459XD Infection and inflammatory reaction due to other internal joint prosthesis, subsequent encounter: Secondary | ICD-10-CM

## 2019-03-16 NOTE — Assessment & Plan Note (Signed)
I am hopeful that his prosthetic joint infection has now been cured.  He will stop cephalexin and rifampin and resume rivaroxaban anticoagulation.  He will follow-up here in 6 weeks.  He knows to call me immediately if he has any signs of relapse.

## 2019-03-16 NOTE — Progress Notes (Signed)
[facility] for Infectious Disease  Patient Active Problem List   Diagnosis Date Noted  . Infected right TKA 09/10/2018    Priority: High  . S/P TKR (total knee replacement), right 05/31/2018    Priority: High  . High risk medication use 11/08/2018  . S/P left TKA 06/28/2018  . Tubular adenoma of colon 10/18/2017  . 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 Diabetic nephropathy associated with type 2 diabetes mellitus ([place]) 10/13/2016  . Permanent atrial fibrillation 10/13/2016  . Health examination of defined subpopulation 11/15/2015  . Polycythemia vera ([place]) 10/09/2014  . Hyperlipidemia associated with type 2 diabetes mellitus ([place]) 09/17/2014  . Hypertension associated with diabetes ([place]) 09/17/2014  . Diabetes mellitus ([place]) 10/31/2013  . Hx-TIA (transient ischemic attack) 09/05/2012  . GERD (gastroesophageal reflux disease) 08/17/2011  . Obesity (BMI 30-39.9) 08/17/2011  . Psoriatic arthritis ([place]) 08/17/2011  . Obstructive sleep apnea 08/05/2009    Patient's Medications  New Prescriptions   No medications on file  Previous Medications   ACCU-CHEK FASTCLIX LANCETS MISC    CHECK BLOOD SUGAR TWICE A DAY   CARVEDILOL (COREG) 12.5 MG TABLET    Take 1 tablet (12.5 mg total) by mouth 2 (two) times daily.   CLOBETASOL (TEMOVATE) 0.05 % EXTERNAL SOLUTION    Apply 1 application topically 2 (two) times daily as needed (for psoriasis).    DOCUSATE SODIUM (COLACE) 100 MG CAPSULE    Colace 100 mg capsule   100 mg by oral route.   EMPAGLIFLOZIN (JARDIANCE) 10 MG TABS TABLET    Take 10 mg by mouth daily.   ENOXAPARIN (LOVENOX) 100 MG/ML INJECTION    Inject 1 mL (100 mg total) into the skin every 12 (twelve) hours.   GLUCOSE BLOOD (ACCU-CHEK GUIDE) TEST STRIP    USE TO TEST BLOOD GLUCOSE TWICE DAILY   IXEKIZUMAB (TALTZ) 80 MG/ML SOAJ    Inject 80 mg into the skin every 30 (thirty) days.   METHOCARBAMOL (ROBAXIN) 500 MG TABLET    Take 1 tablet (500 mg total) by  mouth every 6 (six) hours as needed for muscle spasms.   OXYMETAZOLINE (AFRIN) 0.05 % NASAL SPRAY    Place 1 spray into both nostrils at bedtime.   PANTOPRAZOLE (PROTONIX) 40 MG TABLET    Take 1 tablet (40 mg total) by mouth 2 (two) times daily.   ROSUVASTATIN (CRESTOR) 10 MG TABLET    Take 1 tablet (10 mg total) by mouth daily.   VITAMIN D, CHOLECALCIFEROL, 1000 UNITS CAPS    Take 1,000 Units by mouth daily.   Modified Medications   No medications on file  Discontinued Medications   CEPHALEXIN (KEFLEX) 500 MG CAPSULE    Take 1 capsule (500 mg total) by mouth 3 (three) times daily.   RIFAMPIN (RIFADIN) 300 MG CAPSULE    Take 2 capsules (600 mg total) by mouth daily.    Subjective: [name] is in for his routine follow-up visit.  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  He received 6 weeks of IV cefazolin with oral rifampin before converting to oral cephalexin and rifampin.  Because of drug drug interactions with rifampin he has been off Xarelto and using enoxaparin injections daily.  He is tired of the injections but otherwise has tolerated his medications well.  Having minimal pain resumed all of his usual activities.  Review of Systems: Review of Systems  Constitutional:  Negative for chills, diaphoresis and fever.  Gastrointestinal: Negative for abdominal pain, diarrhea, nausea and vomiting.  Musculoskeletal: Negative for joint pain.    Past Medical History:  Diagnosis Date  . Arthritis   . Diverticulosis   . Dyslipidemia   . GERD (gastroesophageal reflux disease)   . Herpes labialis   . Hypertension   . Longstanding persistent atrial fibrillation   . Metabolic syndrome   . Obesity   . Proteinuria   . Psoriasis   . Seborrheic dermatitis   . Sleep apnea    very compliant with CPAP  . TIA (transient ischemic attack) 2009    Social History   Tobacco Use  . Smoking status: Never Smoker  .  Smokeless tobacco: Never Used  Substance Use Topics  . Alcohol use: Yes    Comment: 2/month  . Drug use: No    Family History  Problem Relation Age of Onset  . Uterine cancer Mother 86       Cervical cath  . Crohn's disease Mother   . Esophageal cancer Father   . Cancer Other   . Obesity Other     Allergies  Allergen Reactions  . Morphine And Related Other (See Comments)    Hallucinations    Objective: Vitals:   03/16/19 0936  BP: 121/89  Pulse: 86  Temp: 98 F (36.7 C)  Weight: 264 lb (119.7 kg)  Height: 5\' 10"  (1.778 m)   Body mass index is 37.88 kg/m.  Physical Exam Constitutional:      Comments: He is in good spirits.  Musculoskeletal:     Comments: His right knee incision is completely healed.  No swelling, warmth or erythema.  He has excellent range of motion.  Psychiatric:        Mood and Affect: Mood normal.     Lab Results    Problem List Items Addressed This Visit      High   Infected right TKA    I am hopeful that his prosthetic joint infection has now been cured.  He will stop cephalexin and rifampin and resume rivaroxaban anticoagulation.  He will follow-up here in 6 weeks.  He knows to call me immediately if he has any signs of relapse.          Michel Bickers, MD [facility] [phone] pager   [phone] cell 03/16/2019, 10:01 AM

## 2019-03-22 ENCOUNTER — Ambulatory Visit: Payer: 59 | Admitting: Internal Medicine

## 2019-03-22 ENCOUNTER — Other Ambulatory Visit: Payer: Self-pay

## 2019-03-22 DIAGNOSIS — M25561 Pain in right knee: Secondary | ICD-10-CM | POA: Diagnosis not present

## 2019-03-22 DIAGNOSIS — T8459XD Infection and inflammatory reaction due to other internal joint prosthesis, subsequent encounter: Secondary | ICD-10-CM | POA: Diagnosis not present

## 2019-03-22 DIAGNOSIS — T8453XD Infection and inflammatory reaction due to internal right knee prosthesis, subsequent encounter: Secondary | ICD-10-CM | POA: Diagnosis not present

## 2019-03-22 DIAGNOSIS — Z96659 Presence of unspecified artificial knee joint: Secondary | ICD-10-CM

## 2019-03-22 MED ORDER — CEPHALEXIN 500 MG PO CAPS: 500.0000 mg | ORAL_CAPSULE | Freq: Three times a day (TID) | ORAL | 11 refills | Status: DC

## 2019-03-22 NOTE — Progress Notes (Signed)
[facility] for Infectious Disease  Patient Active Problem List   Diagnosis Date Noted  . Infected right TKA 09/10/2018    Priority: High  . S/P TKR (total knee replacement), right 05/31/2018    Priority: High  . High risk medication use 11/08/2018  . S/P left TKA 06/28/2018  . Tubular adenoma of colon 10/18/2017  . 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 Diabetic nephropathy associated with type 2 diabetes mellitus ([place]) 10/13/2016  . Permanent atrial fibrillation 10/13/2016  . Health examination of defined subpopulation 11/15/2015  . Polycythemia vera ([place]) 10/09/2014  . Hyperlipidemia associated with type 2 diabetes mellitus ([place]) 09/17/2014  . Hypertension associated with diabetes ([place]) 09/17/2014  . Diabetes mellitus ([place]) 10/31/2013  . Hx-TIA (transient ischemic attack) 09/05/2012  . GERD (gastroesophageal reflux disease) 08/17/2011  . Obesity (BMI 30-39.9) 08/17/2011  . Psoriatic arthritis ([place]) 08/17/2011  . Obstructive sleep apnea 08/05/2009    Patient's Medications  New Prescriptions   CEPHALEXIN (KEFLEX) 500 MG CAPSULE    Take 1 capsule (500 mg total) by mouth 3 (three) times daily.  Previous Medications   ACCU-CHEK FASTCLIX LANCETS MISC    CHECK BLOOD SUGAR TWICE A DAY   CARVEDILOL (COREG) 12.5 MG TABLET    Take 1 tablet (12.5 mg total) by mouth 2 (two) times daily.   CLOBETASOL (TEMOVATE) 0.05 % EXTERNAL SOLUTION    Apply 1 application topically 2 (two) times daily as needed (for psoriasis).    DOCUSATE SODIUM (COLACE) 100 MG CAPSULE    Colace 100 mg capsule   100 mg by oral route.   EMPAGLIFLOZIN (JARDIANCE) 10 MG TABS TABLET    Take 10 mg by mouth daily.   ENOXAPARIN (LOVENOX) 100 MG/ML INJECTION    Inject 1 mL (100 mg total) into the skin every 12 (twelve) hours.   GLUCOSE BLOOD (ACCU-CHEK GUIDE) TEST STRIP    USE TO TEST BLOOD GLUCOSE TWICE DAILY   IXEKIZUMAB (TALTZ) 80 MG/ML SOAJ    Inject 80 mg into the skin every 30 (thirty) days.    METHOCARBAMOL (ROBAXIN) 500 MG TABLET    Take 1 tablet (500 mg total) by mouth every 6 (six) hours as needed for muscle spasms.   OXYMETAZOLINE (AFRIN) 0.05 % NASAL SPRAY    Place 1 spray into both nostrils at bedtime.   PANTOPRAZOLE (PROTONIX) 40 MG TABLET    Take 1 tablet (40 mg total) by mouth 2 (two) times daily.   ROSUVASTATIN (CRESTOR) 10 MG TABLET    Take 1 tablet (10 mg total) by mouth daily.   VITAMIN D, CHOLECALCIFEROL, 1000 UNITS CAPS    Take 1,000 Units by mouth daily.   Modified Medications   No medications on file  Discontinued Medications   No medications on file    Subjective: [name] is in for an urgent work in visit.  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  He received 6 weeks of IV cefazolin with oral rifampin before converting to oral cephalexin and rifampin.  He completed 6 months of total antibiotic therapy last week.  He was doing very well with no residual signs of infection and we decided that it was time to stop antibiotic therapy and see how he does.  He felt fine when he got up this morning but developed sudden pain, swelling and warmth of his right knee at work.  He has not had any recent  injury.  He called promptly and came in to see me.  Review of Systems: Review of Systems  Constitutional: Negative for chills, diaphoresis and fever.  Gastrointestinal: Negative for abdominal pain, diarrhea, nausea and vomiting.  Musculoskeletal: Positive for joint pain.    Past Medical History:  Diagnosis Date  . Arthritis   . Diverticulosis   . Dyslipidemia   . GERD (gastroesophageal reflux disease)   . Herpes labialis   . Hypertension   . Longstanding persistent atrial fibrillation   . Metabolic syndrome   . Obesity   . Proteinuria   . Psoriasis   . Seborrheic dermatitis   . Sleep apnea    very compliant with CPAP  . TIA (transient ischemic attack) 2009    Social History    Tobacco Use  . Smoking status: Never Smoker  . Smokeless tobacco: Never Used  Substance Use Topics  . Alcohol use: Yes    Comment: 2/month  . Drug use: No    Family History  Problem Relation Age of Onset  . Uterine cancer Mother 74       Cervical cath  . Crohn's disease Mother   . Esophageal cancer Father   . Cancer Other   . Obesity Other     Allergies  Allergen Reactions  . Morphine And Related Other (See Comments)    Hallucinations    Objective: There were no vitals filed for this visit. There is no height or weight on file to calculate BMI.  Physical Exam Musculoskeletal:        General: Swelling and tenderness present.     Comments: He limped into the exam room.  His right knee incision is completely healed.  He has acute swelling and warmth of his right knee.  Psychiatric:        Mood and Affect: Mood normal.       Problem List Items Addressed This Visit      High   Infected right TKA    I strongly suspect that [name] has had a very early relapse of his MSSA prosthetic knee infection.  I am going to start him back on cephalexin and see him back in 1 week.  I have asked him to call his orthopedic surgeon, Dr. Adriana Mccallum, and arrange a visit as soon as possible.      Relevant Medications   cephALEXin (KEFLEX) 500 MG capsule       Michel Bickers, MD [facility] for Infectious [place] [phone] pager   [phone] cell 03/22/2019, 10:59 AM

## 2019-03-22 NOTE — Assessment & Plan Note (Signed)
I strongly suspect that [name] has had a very early relapse of his MSSA prosthetic knee infection.  I am going to start him back on cephalexin and see him back in 1 week.  I have asked him to call his orthopedic surgeon, Dr. Adriana Mccallum, and arrange a visit as soon as possible.

## 2019-03-23 ENCOUNTER — Ambulatory Visit (INDEPENDENT_AMBULATORY_CARE_PROVIDER_SITE_OTHER): Payer: 59 | Admitting: Family Medicine

## 2019-03-23 ENCOUNTER — Telehealth: Payer: Self-pay

## 2019-03-23 ENCOUNTER — Encounter: Payer: Self-pay | Admitting: Family Medicine

## 2019-03-23 VITALS — BP 120/82 | HR 62

## 2019-03-23 DIAGNOSIS — M009 Pyogenic arthritis, unspecified: Secondary | ICD-10-CM

## 2019-03-23 LAB — CBC WITH DIFFERENTIAL/PLATELET
Basophils Absolute: 0 10*3/uL (ref 0.0–0.2)
Basos: 0 %
EOS (ABSOLUTE): 0 10*3/uL (ref 0.0–0.4)
Eos: 0 %
Hematocrit: 50.9 % (ref 37.5–51.0)
Hemoglobin: 18.2 g/dL — ABNORMAL HIGH (ref 13.0–17.7)
Lymphocytes Absolute: 1 10*3/uL (ref 0.7–3.1)
Lymphs: 10 %
MCH: 32.3 pg (ref 26.6–33.0)
MCHC: 35.8 g/dL — ABNORMAL HIGH (ref 31.5–35.7)
MCV: 90 fL (ref 79–97)
Monocytes Absolute: 1.1 10*3/uL — ABNORMAL HIGH (ref 0.1–0.9)
Monocytes: 11 %
Neutrophils Absolute: 7.6 10*3/uL — ABNORMAL HIGH (ref 1.4–7.0)
Neutrophils: 79 %
Platelets: 138 10*3/uL — ABNORMAL LOW (ref 150–450)
RBC: 5.63 x10E6/uL (ref 4.14–5.80)
RDW: 15.2 % (ref 11.6–15.4)
WBC: 9.7 10*3/uL (ref 3.4–10.8)

## 2019-03-23 MED ORDER — OXYCODONE-ACETAMINOPHEN 10-325 MG PO TABS: 1.0000 | ORAL_TABLET | Freq: Three times a day (TID) | ORAL | 0 refills | Status: DC | PRN

## 2019-03-23 MED FILL — OXYCODONE-ACETAMINOPHEN 10-: 10-325 | 6 days supply | Qty: 20 | Fill #0

## 2019-03-23 NOTE — Progress Notes (Signed)
   Subjective:    Patient ID: [name]., male    DOB: Sep 16, 1960, 58 y.o.   MRN: [id number]  HPI He is here on an emergency visit.  He started having difficulty yesterday with right knee pain and swelling.  He has a history of TKR and subsequent infection with staph and has been on antibiotics for 6 months.  He finished the antibiotic 8 days ago.  Yesterday he noted the onset of pain and swelling in his knee.  He was seen by Dr. Megan Salon and Kennedy Bucker and subsequently plan to be admitted Monday for removal of equipment and start back on IV antibiotics.  Dr. Megan Salon placed him back on cephalexin.  He is here today because of increasing pain and also have blood drawn.  He does complain of severe pain as well as myalgias.     Review of Systems     Objective:   Physical Exam Alert and in obvious physical pain.  The right knee is slightly erythematous warm and tender.       Assessment & Plan:  Pyogenic arthritis of right knee joint, due to unspecified organism ([place]) - Plan: CBC with Differential/Platelet, Comprehensive metabolic panel, Sedimentation rate, C-reactive protein, oxyCODONE-acetaminophen (PERCOCET) 10-325 MG tablet,  I will get a stat CBC to make sure his white count is not elevated.  Hopefully he can wait till next week to have surgery.

## 2019-03-23 NOTE — Telephone Encounter (Signed)
Pt was called to advise wbc was normal and per JCL pt is to keep [place] informed on how he feels. [place]

## 2019-03-24 ENCOUNTER — Encounter (HOSPITAL_COMMUNITY): Payer: Self-pay

## 2019-03-24 ENCOUNTER — Telehealth: Payer: Self-pay | Admitting: Cardiovascular Disease

## 2019-03-24 ENCOUNTER — Other Ambulatory Visit (HOSPITAL_COMMUNITY): Payer: Self-pay | Admitting: *Deleted

## 2019-03-24 ENCOUNTER — Other Ambulatory Visit: Payer: Self-pay | Admitting: Physician Assistant

## 2019-03-24 ENCOUNTER — Telehealth: Payer: Self-pay | Admitting: *Deleted

## 2019-03-24 ENCOUNTER — Other Ambulatory Visit: Payer: Self-pay

## 2019-03-24 ENCOUNTER — Encounter (HOSPITAL_COMMUNITY)
Admission: RE | Admit: 2019-03-24 | Discharge: 2019-03-24 | Disposition: A | Discharge status: Home / Self Care | Payer: 59 | Source: Ambulatory Visit | Attending: Orthopedic Surgery | Admitting: Orthopedic Surgery

## 2019-03-24 ENCOUNTER — Other Ambulatory Visit (HOSPITAL_COMMUNITY)
Admission: RE | Admit: 2019-03-24 | Discharge: 2019-03-24 | Disposition: A | Discharge status: Home / Self Care | Payer: 59 | Source: Ambulatory Visit | Attending: Orthopedic Surgery | Admitting: Orthopedic Surgery

## 2019-03-24 DIAGNOSIS — T8453XA Infection and inflammatory reaction due to internal right knee prosthesis, initial encounter: Secondary | ICD-10-CM | POA: Insufficient documentation

## 2019-03-24 DIAGNOSIS — Z01812 Encounter for preprocedural laboratory examination: Secondary | ICD-10-CM | POA: Insufficient documentation

## 2019-03-24 DIAGNOSIS — Z1159 Encounter for screening for other viral diseases: Secondary | ICD-10-CM | POA: Insufficient documentation

## 2019-03-24 HISTORY — DX: Type 2 diabetes mellitus without complications: E11.9

## 2019-03-24 LAB — COMPREHENSIVE METABOLIC PANEL
ALT: 43 IU/L (ref 0–44)
AST: 24 IU/L (ref 0–40)
Albumin/Globulin Ratio: 2 (ref 1.2–2.2)
Albumin: 4.3 g/dL (ref 3.8–4.9)
Alkaline Phosphatase: 52 IU/L (ref 39–117)
BUN/Creatinine Ratio: 17 (ref 9–20)
BUN: 23 mg/dL (ref 6–24)
Bilirubin Total: 2 mg/dL — ABNORMAL HIGH (ref 0.0–1.2)
CO2: 19 mmol/L — ABNORMAL LOW (ref 20–29)
Calcium: 9.7 mg/dL (ref 8.7–10.2)
Chloride: 94 mmol/L — ABNORMAL LOW (ref 96–106)
Creatinine, Ser: 1.33 mg/dL — ABNORMAL HIGH (ref 0.76–1.27)
GFR calc Af Amer: 67 mL/min/{1.73_m2} (ref >59)
GFR calc non Af Amer: 58 mL/min/{1.73_m2} — ABNORMAL LOW (ref >59)
Globulin, Total: 2.1 g/dL (ref 1.5–4.5)
Glucose: 163 mg/dL — ABNORMAL HIGH (ref 65–99)
Potassium: 4.5 mmol/L (ref 3.5–5.2)
Sodium: 133 mmol/L — ABNORMAL LOW (ref 134–144)
Total Protein: 6.4 g/dL (ref 6.0–8.5)

## 2019-03-24 LAB — SEDIMENTATION RATE: Sed Rate: 5 mm/hr (ref 0–30)

## 2019-03-24 LAB — C-REACTIVE PROTEIN: CRP: 152 mg/L — ABNORMAL HIGH (ref 0–10)

## 2019-03-24 LAB — GLUCOSE, CAPILLARY: Glucose-Capillary: 205 mg/dL — ABNORMAL HIGH (ref 70–99)

## 2019-03-24 MED ORDER — ENOXAPARIN SODIUM 120 MG/0.8ML ~~LOC~~ SOLN: 120.0000 mg | Freq: Two times a day (BID) | SUBCUTANEOUS | 1 refills | Status: DC

## 2019-03-24 MED FILL — ENOXAPARIN 120 MG/0.8 ML SY: 120 | 3 days supply | Qty: 6 | Fill #0

## 2019-03-24 NOTE — Progress Notes (Signed)
Need orders in epic asap for 7-28 surgery, pre op is 1100 today

## 2019-03-24 NOTE — Telephone Encounter (Signed)
Call from [facility] pharmacist. Verifying that enoxaparin order is for 7 syringes.  It is written as 41ml. Reviewed clearance note from today by R. Barrett.  It reads 7 syringes, 1 refill.  Confirmed this with pharmacist.

## 2019-03-24 NOTE — Progress Notes (Signed)
PCP: DR Redmond School  CARDIOLOGIST: DR Cathie Olden  INFO IN Epic:CARDIAC CLEARANCE DR Cathie Olden PENDING 03-24-19, EKG 07-11-18, STRESS TEST 05-23-18, ECHO 05-09-18, CBC WITH DIF, SED RATE, C REACTIVE PROTEIN, CMET 03-23-2019, TYPE AND SCREEN AND HEMAGLOBIN A1C 03-24-19 EPIC  INFO ON CHART:  BLOOD THINNERS AND LAST DOSES: XARELTO LAST DOSE 03-23-2019 ____________________________________  PATIENT SYMPTOMS AT TIME OF PREOP:NONE

## 2019-03-24 NOTE — Patient Instructions (Addendum)
YOU NEED TO HAVE A COVID 19 TEST ON 03-24-2019 at 1200 pm. THIS TEST MUST BE DONE BEFORE SURGERY, COME  [place], [place] , [address]. ONCE YOUR COVID TEST IS COMPLETED, PLEASE BEGIN THE QUARANTINE INSTRUCTIONS AS OUTLINED IN YOUR HANDOUT.                [name]     Your procedure is scheduled on: 03-28-2019  Report to [facility] Main  Entrance  Report to admitting at  130 pm   1 VISITOR IS ALLOWED TO WAIT IN WAITING ROOM  ONLY DAY OF YOUR SURGERY.    Call this number if you have problems the morning of surgery [phone]    Remember:  [place], NO CHEWING GUM CANDY OR MINTS.   NO SOLID FOOD AFTER MIDNIGHT THE NIGHT PRIOR TO SURGERY. NOTHING BY MOUTH EXCEPT CLEAR LIQUIDS UNTIL100 pm. PLEASE  G2 DRINK PER SURGEON ORDER 3 HOURS PRIOR TO SCHEDULED SURGERY TIME WHICH NEEDS TO BE COMPLETED AT 100 PM  CLEAR LIQUID DIET   Foods Allowed                                                                     Foods Excluded  Coffee and tea, regular and decaf                             liquids that you cannot  Plain Jell-O any favor except red or purple                                           see through such as: Fruit ices (not with fruit pulp)                                     milk, soups, orange juice  Iced Popsicles                                    All solid food Carbonated beverages, regular and diet                                    Cranberry, grape and apple juices Sports drinks like Gatorade Lightly seasoned clear broth or consume(fat free) Sugar, honey syrup  Sample Menu Breakfast                                Lunch                                     Supper Cranberry juice  Beef broth                            Chicken broth Jell-O                                     Grape juice                           Apple juice Coffee or tea                        Jell-O                                       Popsicle                                                Coffee or tea                        Coffee or tea  _____________________________________________________________________     Take these medicines the morning of surgery with A SIP OF WATER: OXYCODONE IF NEEDED, ROSUVASTATIN (CRESTOR), CARVEDILOL (COREG), CEPHALEXIN (KEFLEX), PANTAPRAZOLE (PROTONIX)  DO NOT TAKE ANY DIABETIC MEDICATIONS DAY OF YOUR SURGERY       How to Manage Your Diabetes Before and After Surgery  Why is it important to control my blood sugar before and after surgery? . Improving blood sugar levels before and after surgery helps healing and can limit problems. . A way of improving blood sugar control is eating a healthy diet by: o  Eating less sugar and carbohydrates o  Increasing activity/exercise o  Talking with your doctor about reaching your blood sugar goals . High blood sugars (greater than 180 mg/dL) can raise your risk of infections and slow your recovery, so you will need to focus on controlling your diabetes during the weeks before surgery. . Make sure that the doctor who takes care of your diabetes knows about your planned surgery including the date and location.  How do I manage my blood sugar before surgery? . Check your blood sugar at least 4 times a day, starting 2 days before surgery, to make sure that the level is not too high or low. o Check your blood sugar the morning of your surgery when you wake up and every 2 hours until you get to the Short Stay unit. . If your blood sugar is less than 70 mg/dL, you will need to treat for low blood sugar: o Do not take insulin. o Treat a low blood sugar (less than 70 mg/dL) with  cup of clear juice (cranberry or apple), 4 glucose tablets, OR glucose gel. o Recheck blood sugar in 15 minutes after treatment (to make sure it is greater than 70 mg/dL). If your blood sugar is not greater than 70 mg/dL on recheck, call [phone] for  further instructions. . Report your blood sugar to the short stay nurse when you get to Short Stay.  . If you are admitted to the hospital after surgery: o Your blood sugar will be checked by  the staff and you will probably be given insulin after surgery (instead of oral diabetes medicines) to make sure you have good blood sugar levels. o The goal for blood sugar control after surgery is 80-180 mg/dL.   WHAT DO I DO ABOUT MY DIABETES MEDICATION?  [place] Do not take oral diabetes medicines (pills) the morning of surgery.  . THE DAY  BEFORE SURGERY DO NOT TAKE JARDIANCE.    THE DAY OF SURGERY DO NOT TAKE JARDIANCE              You may not have any metal on your body including hair pins and              piercings  Do not wear jewelry, make-up, lotions, powders or perfumes, deodorant             Do not wear nail polish.  Do not shave  48 hours prior to surgery.              Men may shave face and neck.   Do not bring valuables to the hospital. Many.  Contacts, dentures or bridgework may not be worn into surgery.  Leave suitcase in the car. After surgery it may be brought to your room.                   Please read over the following fact sheets you were given: _____________________________________________________________________             [facility] - Preparing for Surgery Before surgery, you can play an important role.  Because skin is not sterile, your skin needs to be as free of germs as possible.  You can reduce the number of germs on your skin by washing with CHG (chlorahexidine gluconate) soap before surgery.  CHG is an antiseptic cleaner which kills germs and bonds with the skin to continue killing germs even after washing. Please DO NOT use if you have an allergy to CHG or antibacterial soaps.  If your skin becomes reddened/irritated stop using the CHG and inform your nurse when you arrive at Short Stay. Do not shave  (including legs and underarms) for at least 48 hours prior to the first CHG shower.  You may shave your face/neck. Please follow these instructions carefully:  1.  Shower with CHG Soap the night before surgery and the  morning of Surgery.  2.  If you choose to wash your hair, wash your hair first as usual with your  normal  shampoo.  3.  After you shampoo, rinse your hair and body thoroughly to remove the  shampoo.                           4.  Use CHG as you would any other liquid soap.  You can apply chg directly  to the skin and wash                       Gently with a scrungie or clean washcloth.  5.  Apply the CHG Soap to your body ONLY FROM THE NECK DOWN.   Do not use on face/ open                           Wound or open sores. Avoid  contact with eyes, ears mouth and genitals (private parts).                       Wash face,  Genitals (private parts) with your normal soap.             6.  Wash thoroughly, paying special attention to the area where your surgery  will be performed.  7.  Thoroughly rinse your body with warm water from the neck down.  8.  DO NOT shower/wash with your normal soap after using and rinsing off  the CHG Soap.                9.  Pat yourself dry with a clean towel.            10.  Wear clean pajamas.            11.  Place clean sheets on your bed the night of your first shower and do not  sleep with pets. Day of Surgery : Do not apply any lotions/deodorants the morning of surgery.  Please wear clean clothes to the hospital/surgery center.  FAILURE TO FOLLOW THESE INSTRUCTIONS MAY RESULT IN THE CANCELLATION OF YOUR SURGERY PATIENT SIGNATURE_________________________________  NURSE SIGNATURE__________________________________  ________________________________________________________________________   [name]  An incentive spirometer is a tool that can help keep your lungs clear and active. This tool measures how well you are filling your lungs with  each breath. Taking long deep breaths may help reverse or decrease the chance of developing breathing (pulmonary) problems (especially infection) following:  A long period of time when you are unable to move or be active. BEFORE THE PROCEDURE   If the spirometer includes an indicator to show your best effort, your nurse or respiratory therapist will set it to a desired goal.  If possible, sit up straight or lean slightly forward. Try not to slouch.  Hold the incentive spirometer in an upright position. INSTRUCTIONS FOR USE  1. Sit on the edge of your bed if possible, or sit up as far as you can in bed or on a chair. 2. Hold the incentive spirometer in an upright position. 3. Breathe out normally. 4. Place the mouthpiece in your mouth and seal your lips tightly around it. 5. Breathe in slowly and as deeply as possible, raising the piston or the ball toward the top of the column. 6. Hold your breath for 3-5 seconds or for as long as possible. Allow the piston or ball to fall to the bottom of the column. 7. Remove the mouthpiece from your mouth and breathe out normally. 8. Rest for a few seconds and repeat Steps 1 through 7 at least 10 times every 1-2 hours when you are awake. Take your time and take a few normal breaths between deep breaths. 9. The spirometer may include an indicator to show your best effort. Use the indicator as a goal to work toward during each repetition. 10. After each set of 10 deep breaths, practice coughing to be sure your lungs are clear. If you have an incision (the cut made at the time of surgery), support your incision when coughing by placing a pillow or rolled up towels firmly against it. Once you are able to get out of bed, walk around indoors and cough well. You may stop using the incentive spirometer when instructed by your caregiver.  RISKS AND COMPLICATIONS  Take your time so you do not get dizzy or light-headed.  If  you are in pain, you may need to take or  ask for pain medication before doing incentive spirometry. It is harder to take a deep breath if you are having pain. AFTER USE  Rest and breathe slowly and easily.  It can be helpful to keep track of a log of your progress. Your caregiver can provide you with a simple table to help with this. If you are using the spirometer at home, follow these instructions: [place] IF:   You are having difficultly using the spirometer.  You have trouble using the spirometer as often as instructed.  Your pain medication is not giving enough relief while using the spirometer.  You develop fever of 100.5 F (38.1 C) or higher. SEEK IMMEDIATE MEDICAL CARE IF:   You cough up bloody sputum that had not been present before.  You develop fever of 102 F (38.9 C) or greater.  You develop worsening pain at or near the incision site. MAKE SURE YOU:   Understand these instructions.  Will watch your condition.  Will get help right away if you are not doing well or get worse. Document Released: 12/28/2006 Document Revised: 11/09/2011 Document Reviewed: 02/28/2007 ExitCare Patient Information 2014 ExitCare, [place].   ________________________________________________________________________  WHAT IS A BLOOD TRANSFUSION? Blood Transfusion Information  A transfusion is the replacement of blood or some of its parts. Blood is made up of multiple cells which provide different functions.  Red blood cells carry oxygen and are used for blood loss replacement.  White blood cells fight against infection.  Platelets control bleeding.  Plasma helps clot blood.  Other blood products are available for specialized needs, such as hemophilia or other clotting disorders. BEFORE THE TRANSFUSION  Who gives blood for transfusions?   Healthy volunteers who are fully evaluated to make sure their blood is safe. This is blood bank blood. Transfusion therapy is the safest it has ever been in the practice of  medicine. Before blood is taken from a donor, a complete history is taken to make sure that person has no history of diseases nor engages in risky social behavior (examples are intravenous drug use or sexual activity with multiple partners). The donor's travel history is screened to minimize risk of transmitting infections, such as malaria. The donated blood is tested for signs of infectious diseases, such as HIV and hepatitis. The blood is then tested to be sure it is compatible with you in order to minimize the chance of a transfusion reaction. If you or a relative donates blood, this is often done in anticipation of surgery and is not appropriate for emergency situations. It takes many days to process the donated blood. RISKS AND COMPLICATIONS Although transfusion therapy is very safe and saves many lives, the main dangers of transfusion include:   Getting an infectious disease.  Developing a transfusion reaction. This is an allergic reaction to something in the blood you were given. Every precaution is taken to prevent this. The decision to have a blood transfusion has been considered carefully by your caregiver before blood is given. Blood is not given unless the benefits outweigh the risks. AFTER THE TRANSFUSION  Right after receiving a blood transfusion, you will usually feel much better and more energetic. This is especially true if your red blood cells have gotten low (anemic). The transfusion raises the level of the red blood cells which carry oxygen, and this usually causes an energy increase.  The nurse administering the transfusion will monitor you carefully for complications.  HOME CARE INSTRUCTIONS  No special instructions are needed after a transfusion. You may find your energy is better. Speak with your caregiver about any limitations on activity for underlying diseases you may have. SEEK MEDICAL CARE IF:   Your condition is not improving after your transfusion.  You develop  redness or irritation at the intravenous (IV) site. SEEK IMMEDIATE MEDICAL CARE IF:  Any of the following symptoms occur over the next 12 hours:  Shaking chills.  You have a temperature by mouth above 102 F (38.9 C), not controlled by medicine.  Chest, back, or muscle pain.  People around you feel you are not acting correctly or are confused.  Shortness of breath or difficulty breathing.  Dizziness and fainting.  You get a rash or develop hives.  You have a decrease in urine output.  Your urine turns a dark color or changes to pink, red, or brown. Any of the following symptoms occur over the next 10 days:  You have a temperature by mouth above 102 F (38.9 C), not controlled by medicine.  Shortness of breath.  Weakness after normal activity.  The white part of the eye turns yellow (jaundice).  You have a decrease in the amount of urine or are urinating less often.  Your urine turns a dark color or changes to pink, red, or brown. Document Released: 08/14/2000 Document Revised: 11/09/2011 Document Reviewed: 04/02/2008 [employer] Patient Information 2014 [place], [place].  _______________________________________________________________________

## 2019-03-24 NOTE — Progress Notes (Addendum)
PENDING CARDIAC CLEARANCE NOTE DR Cathie Olden 03-24-19 EKG 07-11-18 Epic ECHO 05-09-18 Epic CBC WITH DIF 03-23-19 Epic CMET, SED RATE C REACTIVE PROTIEN 03-23-19 EPIC

## 2019-03-24 NOTE — Telephone Encounter (Addendum)
   Primary Cardiologist: Mertie Moores, MD  Chart reviewed as part of pre-operative protocol coverage. Patient was contacted 03/24/2019 in reference to pre-operative risk assessment for pending surgery as outlined below.  William Barges. was last seen on 10/24/2018 by Dr Acie Fredrickson.  Since that day, William Denardo. has done well.   Although he has gained some weight, he says it is weight, not fluid.  He feels his volume status is good.  He is getting 6000-7000 steps per day even with his knee as bad as it is.  He states he was taken off antibiotics, he had completed them and was started back on his Xarelto.  However, he developed swelling and pain in his knee and immediately went to see his physicians who told him the infection was back.  He has not had any chest pain.  His dyspnea on exertion is at baseline.  He sleeps with CPAP and is compliant with it, denying orthopnea or PND.  Therefore, based on ACC/AHA guidelines, the patient would be at acceptable risk for the planned procedure without further cardiovascular testing.   I will route this recommendation to the requesting party via Epic fax function and remove from pre-op pool.  Please call with questions.  Rosaria Ferries, PA-C 03/24/2019, 2:49 PM  Anticoagulation: I contacted the pharmacist about his anticoagulation. His CHA2DS2-VASc = 5 (HTN, DM, TIA x 2, CHF) and cross coverage with Lovenox is indicated.  I was not able to get the patient back on the phone to let him know this.  I did leave him a voicemail.  Based on his current weight, his Lovenox dose is 120 mg every 12 hours.  I sent in prescription for this dose to the [facility], 7 syringes with 1 refill in case he needs it after the surgery.  Rosaria Ferries, PA-C 03/24/2019 2:56 PM [facility] [phone]  Dr Alvan Dame  office phone number [phone]  office fax number [phone]

## 2019-03-24 NOTE — Telephone Encounter (Signed)
° °  [facility] Pre-operative Risk Assessment    Request for surgical clearance:  1. What type of surgery is being performed?  Infected knee, putting in antibiotic spacers   2. When is this surgery scheduled? 03-28-19   3. What type of clearance is required (medical clearance vs. Pharmacy clearance to hold med vs. Both)? Both*  4. Are there any medications that need to be held prior to surgery and how long- Does pt need to stop his blood thinner? 90   5. Practice name and name of physician performing surgery?  Dr Alvan Dame   6. What is your office phone number [phone]    7.   What is your office fax number [phone]  8.   Anesthesia type (None, local, MAC, general) ? Spinal   [name] 03/24/2019, 8:37 AM  _________________________________________________________________   (provider comments below)

## 2019-03-25 ENCOUNTER — Telehealth: Payer: Self-pay | Admitting: Physician Assistant

## 2019-03-25 LAB — HEMOGLOBIN A1C
Hgb A1c MFr Bld: 6.2 % — ABNORMAL HIGH (ref 4.8–5.6)
Mean Plasma Glucose: 131 mg/dL

## 2019-03-25 LAB — SARS CORONAVIRUS 2 (TAT 6-24 HRS): SARS Coronavirus 2: NEGATIVE

## 2019-03-25 NOTE — Telephone Encounter (Signed)
Received phone call from patient to clarify anticoagulation instructions. He has already started lovenox and is holding xarelto. He questions when his last dose of lovenox should be. I believe the surgeon will direct him regarding last dose of lovenox. I reviewed the initial preop clearance request, and it does not indicate if the patient will be having spinal anesthesia or general anesthesia for this procedure. Will defer to surgeon regarding last dose of lovenox, likely related to type of anesthesia used.   Ledora Bottcher, PA-C 03/25/2019, 9:18 AM [place], [place] [address]

## 2019-03-27 ENCOUNTER — Telehealth: Payer: Self-pay | Admitting: *Deleted

## 2019-03-27 NOTE — Telephone Encounter (Signed)
Called and confirmed that patient is aware that his last dose of lovenox will be the AM.

## 2019-03-27 NOTE — H&P (Signed)
William Barges. is an 58 y.o. male.    Chief Complaint:  Infected right total knee arthroplasty  Procedure: Excision of right total knee arthroplasty and placement of antibiotic spacer  HPI: Pt is a 58 y.o. male complaining of right knee pain and swelling.   History of total knee arthroplasty on 05/31/18 with a I&D w/ poly exchange on 09/10/18.   He was off antibiotics for a short time prior to returning to the clinic because of pain and swelling.  An aspiration of his right knee was performed and the fluid off for evaluation. He had a previous MSSA infection of this right knee. We will evaluate for cultures. He was started on Keflex prior to evaluation.  Last CRP was 152 (0-10 reference) and sed rate 5 (0-30 reference).  Due to the persistence of his symptoms after treatment with antibiotics, various options were again discussed.  It is decided that it would be best to remove the prosthesis and place and antibiotic spacer.  Patient wishes to proceed in this manner to treat the knee as soon as possible.  Various options are discussed with the patient. Risks, benefits and expectations were discussed with the patient. Patient understand the risks, benefits and expectations and wishes to proceed with surgery.    PCP: Denita Lung, MD  D/C Plans:       Home   Post-op Meds:       No Rx given   Tranexamic Acid:      To be given - IV   Decadron:      Is to be given  FYI:      Lovenox  Norco  CPAP  DME:   Pt already has equipment  PT:    HHRN     PMH: Past Medical History:  Diagnosis Date  . Arthritis   . Diabetes mellitus without complication ([place])    TYPE 2  . Diverticulosis   . Dyslipidemia   . GERD (gastroesophageal reflux disease)   . Herpes labialis   . Hypertension   . Longstanding persistent atrial fibrillation   . Metabolic syndrome   . Obesity   . Proteinuria   . Psoriasis   . Seborrheic dermatitis   . Sleep apnea    very compliant with CPAP, PT NEEDS TO  BRING OWN MACHINE  . TIA (transient ischemic attack) 2009    PSH: Past Surgical History:  Procedure Laterality Date  . CARDIOVERSION N/A 12/02/2016   Procedure: CARDIOVERSION;  Surgeon: Pixie Casino, MD;  Location: [facility] ENDOSCOPY;  Service: Cardiovascular;  Laterality: N/A;  . COLONOSCOPY    . I&D KNEE WITH POLY EXCHANGE Right 09/10/2018   Procedure: Right Knee Arthroplasty IRRIGATION AND DEBRIDEMENT KNEE WITH POLY EXCHANGE;  Surgeon: [name], MD;  Location: [facility] ORS;  Service: Orthopedics;  Laterality: Right;  . JOINT REPLACEMENT  11/03/10   LT HIP  . PFO occluder cardiac valve  2006   Dr. Einar Gip, (hole in heart)  . TONSILLECTOMY AND ADENOIDECTOMY    . TOTAL HIP ARTHROPLASTY Left   . TOTAL KNEE ARTHROPLASTY Right 05/31/2018   Procedure: RIGHT TOTAL KNEE ARTHROPLASTY;  Surgeon: [name], MD;  Location: [facility] ORS;  Service: Orthopedics;  Laterality: Right;  70 mins  . TOTAL KNEE ARTHROPLASTY Left 06/28/2018   Procedure: LEFT TOTAL KNEE ARTHROPLASTY;  Surgeon: [name], MD;  Location: [facility] ORS;  Service: Orthopedics;  Laterality: Left;  70 mins  . [place] EXTRACTION      Social  History:  reports that he has never smoked. He has never used smokeless tobacco. He reports current alcohol use. He reports that he does not use drugs.  Allergies:  Allergies  Allergen Reactions  . Morphine And Related Other (See Comments)    Hallucinations    Medications: No current facility-administered medications for this encounter.    Current Outpatient Medications  Medication Sig Dispense Refill  . carvedilol (COREG) 12.5 MG tablet Take 1 tablet (12.5 mg total) by mouth 2 (two) times daily. 180 tablet 3  . cephALEXin (KEFLEX) 500 MG capsule Take 1 capsule (500 mg total) by mouth 3 (three) times daily. 90 capsule 11  . empagliflozin (JARDIANCE) 10 MG TABS tablet Take 10 mg by mouth daily. 30 tablet 5  . Ixekizumab (TALTZ) 80 MG/ML SOAJ Inject 80 mg into the skin every 30 (thirty) days. 1 mL  11  . Multiple Vitamin (MULTIVITAMIN WITH MINERALS) TABS tablet Take 1 tablet by mouth daily.    Marland Kitchen oxyCODONE-acetaminophen (PERCOCET) 10-325 MG tablet Take 1 tablet by mouth every 8 (eight) hours as needed for up to 5 days for pain. 20 tablet 0  . oxymetazoline (AFRIN) 0.05 % nasal spray Place 1 spray into both nostrils at bedtime as needed for congestion.     . pantoprazole (PROTONIX) 40 MG tablet Take 1 tablet (40 mg total) by mouth 2 (two) times daily. 270 tablet 3  . polyvinyl alcohol (LIQUIFILM TEARS) 1.4 % ophthalmic solution Place 1 drop into both eyes as needed for dry eyes.    . rivaroxaban (XARELTO) 20 MG TABS tablet Take 20 mg by mouth daily after supper.    . rosuvastatin (CRESTOR) 10 MG tablet Take 1 tablet (10 mg total) by mouth daily. 90 tablet 3  . Vitamin D, Cholecalciferol, 1000 units CAPS Take 1,000 Units by mouth daily.     . Accu-Chek FastClix Lancets MISC CHECK BLOOD SUGAR TWICE A DAY 102 each 4  . enoxaparin (LOVENOX) 120 MG/0.8ML injection Inject 0.8 mLs (120 mg total) into the skin 2 (two) times daily. 7 mL 1  . glucose blood (ACCU-CHEK GUIDE) test strip USE TO TEST BLOOD GLUCOSE TWICE DAILY 100 each 2  . methocarbamol (ROBAXIN) 500 MG tablet Take 1 tablet (500 mg total) by mouth every 6 (six) hours as needed for muscle spasms. (Patient not taking: Reported on 03/24/2019) 40 tablet 0  . sacubitril-valsartan (ENTRESTO) 24-26 MG Take 1 tablet by mouth 2 (two) times daily. 60 tablet 11       Review of Systems  Constitutional: Negative.   HENT: Negative.   Eyes: Negative.   Respiratory: Negative.   Cardiovascular: Negative.   Gastrointestinal: Positive for heartburn.  Genitourinary: Negative.   Musculoskeletal: Positive for joint pain.  Skin: Negative.   Neurological: Negative.   Endo/Heme/Allergies: Negative.   Psychiatric/Behavioral: Negative.        Physical Exam  Constitutional: He is oriented to person, place, and time. He appears well-developed.  HENT:   Head: Normocephalic.  Eyes: Pupils are equal, round, and reactive to light.  Neck: Neck supple. No JVD present. No tracheal deviation present. No thyromegaly present.  Cardiovascular: Normal rate, regular rhythm and intact distal pulses.  Respiratory: Effort normal and breath sounds normal. No respiratory distress. He has no wheezes.  GI: Soft. There is no abdominal tenderness. There is no guarding.  Musculoskeletal:     Right knee: He exhibits decreased range of motion, swelling, effusion, erythema and bony tenderness. He exhibits no ecchymosis, no deformity and no  laceration (healed previous incision). Tenderness found.  Lymphadenopathy:    He has no cervical adenopathy.  Neurological: He is alert and oriented to person, place, and time.  Skin: Skin is warm and dry.  Psychiatric: He has a normal mood and affect.       Assessment/Plan Assessment:    Infected right total knee arthroplasty  Plan: Patient will undergo a excision of right total knee arthroplasty and placement of antibiotic spacer on 03/28/2019 per Dr. Alvan Dame at [facility]. Risks benefits and expectations were discussed with the patient. Patient understand risks, benefits and expectations and wishes to proceed.      West Pugh Matson Welch   PA-C  03/27/2019, 9:13 PM

## 2019-03-27 NOTE — Telephone Encounter (Signed)
   Primary Cardiologist:Philip [name], MD  Chart reviewed as part of pre-operative protocol coverage. Pre-op clearance already addressed by colleagues in earlier phone notes. Will route phone note from 03/24/19 to requesting surgeon. To summarize recommendations:  - Rosaria Ferries PA-C has cleared the patient after speaking with him, stating he "would be at acceptable risk for the planned procedure without further cardiovascular testing."  - The patient is not actively on Xarelto at this time, but typically is. Through our office, he has been bridged with Lovenox. I spoke with pharmacist Melissa who confirms she spoke with patient about his Lovenox instructions and his last dose will have been this morning. We typically advise that blood thinners be resumed as soon as felt safe by performing physician, and per pharmacist that can be the morning after surgery if doing well. If he has to go back on rifampin, pharmacist indicates he will not be able to go back on Xarelto and will need Lovenox post-operatively as well. Please let our office know if we can be of assistance after surgery.  Will route this bundled recommendation to requesting provider via Epic fax function. Please call with questions.  Charlie Pitter, PA-C 03/27/2019, 4:50 PM

## 2019-03-27 NOTE — Telephone Encounter (Signed)
   [facility] Pre-operative Risk Assessment    Request for surgical clearance:  1. What type of surgery is being performed? RESECTION RIGHT TOTAL KNEE and ANTIBIOTIC SPACERS  2. When is this surgery scheduled? TBD  3. What type of clearance is required (medical clearance vs. Pharmacy clearance to hold med vs. Both)? BOTH  4. Are there any medications that need to be held prior to surgery and how long? XARELTO, PT IS ON LOVENOX   5. Practice name and name of physician performing surgery? [facility]; DR. Alvan Dame  6. What is your office phone number [phone]   7.   What is your office fax number [phone]  8.   Anesthesia type (None, local, MAC, general) ? SPINAL   Julaine Hua 03/27/2019, 4:03 PM  _________________________________________________________________   (provider comments below)

## 2019-03-28 ENCOUNTER — Other Ambulatory Visit: Payer: Self-pay

## 2019-03-28 ENCOUNTER — Inpatient Hospital Stay (HOSPITAL_COMMUNITY): Payer: 59 | Admitting: Certified Registered"

## 2019-03-28 ENCOUNTER — Inpatient Hospital Stay (HOSPITAL_COMMUNITY): Payer: 59

## 2019-03-28 ENCOUNTER — Inpatient Hospital Stay: Payer: Self-pay

## 2019-03-28 ENCOUNTER — Encounter (HOSPITAL_COMMUNITY): Payer: Self-pay | Admitting: *Deleted

## 2019-03-28 ENCOUNTER — Encounter (HOSPITAL_COMMUNITY)
Admission: RE | Disposition: A | Discharge status: Home Health | Payer: Self-pay | Source: Home / Self Care | Attending: Orthopedic Surgery

## 2019-03-28 ENCOUNTER — Inpatient Hospital Stay (HOSPITAL_COMMUNITY)
Admission: RE | Admit: 2019-03-28 | Discharge: 2019-03-29 | DRG: 467 | Disposition: A | Discharge status: Home Health | Payer: 59 | Attending: Orthopedic Surgery | Admitting: Orthopedic Surgery

## 2019-03-28 DIAGNOSIS — Z7984 Long term (current) use of oral hypoglycemic drugs: Secondary | ICD-10-CM | POA: Diagnosis not present

## 2019-03-28 DIAGNOSIS — Z79899 Other long term (current) drug therapy: Secondary | ICD-10-CM

## 2019-03-28 DIAGNOSIS — Z96651 Presence of right artificial knee joint: Secondary | ICD-10-CM

## 2019-03-28 DIAGNOSIS — K219 Gastro-esophageal reflux disease without esophagitis: Secondary | ICD-10-CM | POA: Diagnosis present

## 2019-03-28 DIAGNOSIS — M00061 Staphylococcal arthritis, right knee: Secondary | ICD-10-CM

## 2019-03-28 DIAGNOSIS — Y831 Surgical operation with implant of artificial internal device as the cause of abnormal reaction of the patient, or of later complication, without mention of misadventure at the time of the procedure: Secondary | ICD-10-CM | POA: Diagnosis present

## 2019-03-28 DIAGNOSIS — Z792 Long term (current) use of antibiotics: Secondary | ICD-10-CM

## 2019-03-28 DIAGNOSIS — Z96652 Presence of left artificial knee joint: Secondary | ICD-10-CM | POA: Diagnosis present

## 2019-03-28 DIAGNOSIS — G4730 Sleep apnea, unspecified: Secondary | ICD-10-CM | POA: Diagnosis present

## 2019-03-28 DIAGNOSIS — E119 Type 2 diabetes mellitus without complications: Secondary | ICD-10-CM | POA: Diagnosis not present

## 2019-03-28 DIAGNOSIS — Z8673 Personal history of transient ischemic attack (TIA), and cerebral infarction without residual deficits: Secondary | ICD-10-CM | POA: Diagnosis not present

## 2019-03-28 DIAGNOSIS — T8459XA Infection and inflammatory reaction due to other internal joint prosthesis, initial encounter: Secondary | ICD-10-CM

## 2019-03-28 DIAGNOSIS — Z885 Allergy status to narcotic agent status: Secondary | ICD-10-CM | POA: Diagnosis not present

## 2019-03-28 DIAGNOSIS — I4811 Longstanding persistent atrial fibrillation: Secondary | ICD-10-CM | POA: Diagnosis present

## 2019-03-28 DIAGNOSIS — E785 Hyperlipidemia, unspecified: Secondary | ICD-10-CM | POA: Diagnosis present

## 2019-03-28 DIAGNOSIS — Z7901 Long term (current) use of anticoagulants: Secondary | ICD-10-CM

## 2019-03-28 DIAGNOSIS — Z96642 Presence of left artificial hip joint: Secondary | ICD-10-CM | POA: Diagnosis present

## 2019-03-28 DIAGNOSIS — B9561 Methicillin susceptible Staphylococcus aureus infection as the cause of diseases classified elsewhere: Secondary | ICD-10-CM | POA: Diagnosis not present

## 2019-03-28 DIAGNOSIS — I10 Essential (primary) hypertension: Secondary | ICD-10-CM | POA: Diagnosis present

## 2019-03-28 DIAGNOSIS — Z89521 Acquired absence of right knee: Secondary | ICD-10-CM

## 2019-03-28 DIAGNOSIS — E669 Obesity, unspecified: Secondary | ICD-10-CM | POA: Diagnosis present

## 2019-03-28 DIAGNOSIS — Z452 Encounter for adjustment and management of vascular access device: Secondary | ICD-10-CM

## 2019-03-28 DIAGNOSIS — G8918 Other acute postprocedural pain: Secondary | ICD-10-CM | POA: Diagnosis not present

## 2019-03-28 DIAGNOSIS — Z89529 Acquired absence of unspecified knee: Secondary | ICD-10-CM | POA: Diagnosis present

## 2019-03-28 DIAGNOSIS — T8453XA Infection and inflammatory reaction due to internal right knee prosthesis, initial encounter: Secondary | ICD-10-CM | POA: Diagnosis not present

## 2019-03-28 DIAGNOSIS — L409 Psoriasis, unspecified: Secondary | ICD-10-CM | POA: Diagnosis present

## 2019-03-28 DIAGNOSIS — Z6837 Body mass index (BMI) 37.0-37.9, adult: Secondary | ICD-10-CM | POA: Diagnosis not present

## 2019-03-28 DIAGNOSIS — Z96659 Presence of unspecified artificial knee joint: Secondary | ICD-10-CM

## 2019-03-28 DIAGNOSIS — A4901 Methicillin susceptible Staphylococcus aureus infection, unspecified site: Secondary | ICD-10-CM | POA: Diagnosis not present

## 2019-03-28 HISTORY — PX: EXCISIONAL TOTAL KNEE ARTHROPLASTY WITH ANTIBIOTIC SPACERS: SHX5827

## 2019-03-28 LAB — TYPE AND SCREEN
ABO/RH(D): O POS
Antibody Screen: NEGATIVE

## 2019-03-28 LAB — GLUCOSE, CAPILLARY
Glucose-Capillary: 165 mg/dL — ABNORMAL HIGH (ref 70–99)
Glucose-Capillary: 150 mg/dL — ABNORMAL HIGH (ref 70–99)

## 2019-03-28 SURGERY — REMOVAL, TOTAL ARTHROPLASTY HARDWARE, KNEE, WITH ANTIBIOTIC SPACER INSERTION
Anesthesia: Spinal | Laterality: Right

## 2019-03-28 MED ORDER — METHOCARBAMOL 500 MG IVPB - SIMPLE MED
500.0000 mg | Freq: Four times a day (QID) | INTRAVENOUS | Status: DC | PRN
  Filled 2019-03-28: qty 50

## 2019-03-28 MED ORDER — SODIUM CHLORIDE 0.9 % IV SOLN
INTRAVENOUS | Status: DC | PRN
  Administered 2019-03-28: 14:00:00 33 ug/min via INTRAVENOUS

## 2019-03-28 MED ORDER — LIDOCAINE 2% (20 MG/ML) 5 ML SYRINGE
INTRAMUSCULAR | Status: AC
  Filled 2019-03-28: qty 5

## 2019-03-28 MED ORDER — CHLORHEXIDINE GLUCONATE 4 % EX LIQD: 60.0000 mL | Freq: Once | CUTANEOUS | Status: DC

## 2019-03-28 MED ORDER — ONDANSETRON HCL 4 MG/2ML IJ SOLN: 4.0000 mg | Freq: Four times a day (QID) | INTRAMUSCULAR | Status: DC | PRN

## 2019-03-28 MED ORDER — CEFAZOLIN SODIUM-DEXTROSE 2-4 GM/100ML-% IV SOLN
2.0000 g | Freq: Three times a day (TID) | INTRAVENOUS | Status: DC
  Administered 2019-03-28 – 2019-03-29 (×2): 2 g via INTRAVENOUS
  Filled 2019-03-28 (×2): qty 100

## 2019-03-28 MED ORDER — SODIUM CHLORIDE (PF) 0.9 % IJ SOLN
INTRAMUSCULAR | Status: AC
  Filled 2019-03-28: qty 50

## 2019-03-28 MED ORDER — SODIUM CHLORIDE 0.9% FLUSH: 10.0000 mL | INTRAVENOUS | Status: DC | PRN

## 2019-03-28 MED ORDER — PROPOFOL 10 MG/ML IV BOLUS
INTRAVENOUS | Status: AC
  Filled 2019-03-28: qty 40

## 2019-03-28 MED ORDER — TRANEXAMIC ACID-NACL 1000-0.7 MG/100ML-% IV SOLN
1000.0000 mg | INTRAVENOUS | Status: AC
  Administered 2019-03-28: 14:00:00 1000 mg via INTRAVENOUS
  Filled 2019-03-28: qty 100

## 2019-03-28 MED ORDER — ROSUVASTATIN CALCIUM 10 MG PO TABS
10.0000 mg | ORAL_TABLET | Freq: Every day | ORAL | Status: DC
  Administered 2019-03-29: 10 mg via ORAL
  Filled 2019-03-28: qty 1

## 2019-03-28 MED ORDER — SODIUM CHLORIDE 0.9% FLUSH
10.0000 mL | Freq: Two times a day (BID) | INTRAVENOUS | Status: DC
  Administered 2019-03-28 – 2019-03-29 (×2): 10 mL

## 2019-03-28 MED ORDER — PROPOFOL 500 MG/50ML IV EMUL
INTRAVENOUS | Status: DC | PRN
  Administered 2019-03-28: 75 ug/kg/min via INTRAVENOUS

## 2019-03-28 MED ORDER — METHOCARBAMOL 500 MG PO TABS
500.0000 mg | ORAL_TABLET | Freq: Four times a day (QID) | ORAL | Status: DC | PRN
  Administered 2019-03-28 – 2019-03-29 (×2): 500 mg via ORAL
  Filled 2019-03-28 (×2): qty 1

## 2019-03-28 MED ORDER — ONDANSETRON HCL 4 MG/2ML IJ SOLN
INTRAMUSCULAR | Status: DC | PRN
  Administered 2019-03-28: 4 mg via INTRAVENOUS

## 2019-03-28 MED ORDER — TOBRAMYCIN SULFATE 1.2 G IJ SOLR
INTRAMUSCULAR | Status: AC
  Filled 2019-03-28: qty 3.6

## 2019-03-28 MED ORDER — CEFAZOLIN SODIUM-DEXTROSE 2-4 GM/100ML-% IV SOLN: 2.0000 g | Freq: Three times a day (TID) | INTRAVENOUS | Status: DC

## 2019-03-28 MED ORDER — DEXTROSE 5 % IV SOLN
3.0000 g | Freq: Once | INTRAVENOUS | Status: AC
  Administered 2019-03-28: 14:00:00 3 g via INTRAVENOUS
  Filled 2019-03-28: qty 3

## 2019-03-28 MED ORDER — PROPOFOL 10 MG/ML IV BOLUS
INTRAVENOUS | Status: AC
  Filled 2019-03-28: qty 20

## 2019-03-28 MED ORDER — MIDAZOLAM HCL 2 MG/2ML IJ SOLN
INTRAMUSCULAR | Status: AC
  Filled 2019-03-28: qty 2

## 2019-03-28 MED ORDER — CANAGLIFLOZIN 100 MG PO TABS
100.0000 mg | ORAL_TABLET | Freq: Every day | ORAL | Status: DC
  Administered 2019-03-29: 100 mg via ORAL
  Filled 2019-03-28: qty 1

## 2019-03-28 MED ORDER — ALUM & MAG HYDROXIDE-SIMETH 200-200-20 MG/5ML PO SUSP: 15.0000 mL | ORAL | Status: DC | PRN

## 2019-03-28 MED ORDER — VANCOMYCIN HCL 1000 MG IV SOLR
INTRAVENOUS | Status: AC
  Filled 2019-03-28: qty 4000

## 2019-03-28 MED ORDER — OXYCODONE HCL 5 MG PO TABS: 10.0000 mg | ORAL_TABLET | ORAL | Status: DC | PRN

## 2019-03-28 MED ORDER — DEXAMETHASONE SODIUM PHOSPHATE 10 MG/ML IJ SOLN
INTRAMUSCULAR | Status: DC | PRN
  Administered 2019-03-28: 10 mg via INTRAVENOUS

## 2019-03-28 MED ORDER — SODIUM CHLORIDE 0.9 % IV SOLN
INTRAVENOUS | Status: DC
  Administered 2019-03-28 – 2019-03-29 (×2): via INTRAVENOUS

## 2019-03-28 MED ORDER — PHENYLEPHRINE 40 MCG/ML (10ML) SYRINGE FOR IV PUSH (FOR BLOOD PRESSURE SUPPORT): PREFILLED_SYRINGE | INTRAVENOUS | Status: DC | PRN

## 2019-03-28 MED ORDER — PANTOPRAZOLE SODIUM 40 MG PO TBEC
40.0000 mg | DELAYED_RELEASE_TABLET | Freq: Two times a day (BID) | ORAL | Status: DC
  Administered 2019-03-28 – 2019-03-29 (×2): 40 mg via ORAL
  Filled 2019-03-28 (×2): qty 1

## 2019-03-28 MED ORDER — DEXAMETHASONE SODIUM PHOSPHATE 10 MG/ML IJ SOLN
INTRAMUSCULAR | Status: AC
  Filled 2019-03-28: qty 1

## 2019-03-28 MED ORDER — DEXAMETHASONE SODIUM PHOSPHATE 10 MG/ML IJ SOLN
10.0000 mg | Freq: Once | INTRAMUSCULAR | Status: AC
  Administered 2019-03-29: 10 mg via INTRAVENOUS
  Filled 2019-03-28: qty 1

## 2019-03-28 MED ORDER — DIPHENHYDRAMINE HCL 12.5 MG/5ML PO ELIX: 12.5000 mg | ORAL_SOLUTION | ORAL | Status: DC | PRN

## 2019-03-28 MED ORDER — ONDANSETRON HCL 4 MG/2ML IJ SOLN
INTRAMUSCULAR | Status: AC
  Filled 2019-03-28: qty 2

## 2019-03-28 MED ORDER — DOCUSATE SODIUM 100 MG PO CAPS
100.0000 mg | ORAL_CAPSULE | Freq: Two times a day (BID) | ORAL | Status: DC
  Administered 2019-03-28 – 2019-03-29 (×2): 100 mg via ORAL
  Filled 2019-03-28 (×2): qty 1

## 2019-03-28 MED ORDER — ROPIVACAINE HCL 7.5 MG/ML IJ SOLN
INTRAMUSCULAR | Status: DC | PRN
  Administered 2019-03-28: 20 mL via PERINEURAL

## 2019-03-28 MED ORDER — CELECOXIB 200 MG PO CAPS
200.0000 mg | ORAL_CAPSULE | Freq: Two times a day (BID) | ORAL | Status: DC
  Administered 2019-03-28 – 2019-03-29 (×2): 200 mg via ORAL
  Filled 2019-03-28 (×2): qty 1

## 2019-03-28 MED ORDER — BISACODYL 10 MG RE SUPP: 10.0000 mg | Freq: Every day | RECTAL | Status: DC | PRN

## 2019-03-28 MED ORDER — TRANEXAMIC ACID-NACL 1000-0.7 MG/100ML-% IV SOLN
1000.0000 mg | Freq: Once | INTRAVENOUS | Status: AC
  Administered 2019-03-28: 1000 mg via INTRAVENOUS
  Filled 2019-03-28: qty 100

## 2019-03-28 MED ORDER — BUPIVACAINE-EPINEPHRINE (PF) 0.25% -1:200000 IJ SOLN
INTRAMUSCULAR | Status: AC
  Filled 2019-03-28: qty 30

## 2019-03-28 MED ORDER — FENTANYL CITRATE (PF) 250 MCG/5ML IJ SOLN
INTRAMUSCULAR | Status: AC
  Filled 2019-03-28: qty 5

## 2019-03-28 MED ORDER — PROPOFOL 10 MG/ML IV BOLUS
INTRAVENOUS | Status: DC | PRN
  Administered 2019-03-28 (×2): 20 mg via INTRAVENOUS

## 2019-03-28 MED ORDER — FENTANYL CITRATE (PF) 100 MCG/2ML IJ SOLN
INTRAMUSCULAR | Status: DC | PRN
  Administered 2019-03-28: 100 ug via INTRAVENOUS

## 2019-03-28 MED ORDER — MENTHOL 3 MG MT LOZG: 1.0000 | LOZENGE | OROMUCOSAL | Status: DC | PRN

## 2019-03-28 MED ORDER — FERROUS SULFATE 325 (65 FE) MG PO TABS
325.0000 mg | ORAL_TABLET | Freq: Two times a day (BID) | ORAL | Status: DC
  Administered 2019-03-28 – 2019-03-29 (×2): 325 mg via ORAL
  Filled 2019-03-28 (×2): qty 1

## 2019-03-28 MED ORDER — BUPIVACAINE-EPINEPHRINE (PF) 0.25% -1:200000 IJ SOLN
INTRAMUSCULAR | Status: DC | PRN
  Administered 2019-03-28: 30 mL via PERINEURAL

## 2019-03-28 MED ORDER — HYDROMORPHONE HCL 1 MG/ML IJ SOLN: 0.5000 mg | INTRAMUSCULAR | Status: DC | PRN

## 2019-03-28 MED ORDER — BUPIVACAINE IN DEXTROSE 0.75-8.25 % IT SOLN
INTRATHECAL | Status: DC | PRN
  Administered 2019-03-28: 1.8 mL via INTRATHECAL

## 2019-03-28 MED ORDER — RIVAROXABAN 10 MG PO TABS
10.0000 mg | ORAL_TABLET | Freq: Every day | ORAL | Status: DC
  Administered 2019-03-29: 10 mg via ORAL
  Filled 2019-03-28: qty 1

## 2019-03-28 MED ORDER — POLYETHYLENE GLYCOL 3350 17 G PO PACK
17.0000 g | PACK | Freq: Two times a day (BID) | ORAL | Status: DC
  Administered 2019-03-28: 17 g via ORAL
  Filled 2019-03-28 (×2): qty 1

## 2019-03-28 MED ORDER — FENTANYL CITRATE (PF) 100 MCG/2ML IJ SOLN
50.0000 ug | Freq: Once | INTRAMUSCULAR | Status: AC
  Administered 2019-03-28: 50 ug via INTRAVENOUS
  Filled 2019-03-28: qty 2

## 2019-03-28 MED ORDER — METOCLOPRAMIDE HCL 5 MG PO TABS: 5.0000 mg | ORAL_TABLET | Freq: Three times a day (TID) | ORAL | Status: DC | PRN

## 2019-03-28 MED ORDER — LACTATED RINGERS IV SOLN
INTRAVENOUS | Status: DC
  Administered 2019-03-28 (×3): via INTRAVENOUS

## 2019-03-28 MED ORDER — MIDAZOLAM HCL 2 MG/2ML IJ SOLN
INTRAMUSCULAR | Status: DC | PRN
  Administered 2019-03-28: 2 mg via INTRAVENOUS

## 2019-03-28 MED ORDER — MIDAZOLAM HCL 2 MG/2ML IJ SOLN
1.0000 mg | Freq: Once | INTRAMUSCULAR | Status: AC
  Administered 2019-03-28: 2 mg via INTRAVENOUS
  Filled 2019-03-28: qty 2

## 2019-03-28 MED ORDER — OXYCODONE HCL 5 MG PO TABS: 5.0000 mg | ORAL_TABLET | ORAL | Status: DC | PRN

## 2019-03-28 MED ORDER — DEXAMETHASONE SODIUM PHOSPHATE 10 MG/ML IJ SOLN: 10.0000 mg | Freq: Once | INTRAMUSCULAR | Status: DC

## 2019-03-28 MED ORDER — POVIDONE-IODINE 10 % EX SWAB
2.0000 "application " | Freq: Once | CUTANEOUS | Status: AC
  Administered 2019-03-28: 2 via TOPICAL

## 2019-03-28 MED ORDER — SODIUM CHLORIDE 0.9 % IR SOLN
Status: DC | PRN
  Administered 2019-03-28: 3000 mL

## 2019-03-28 MED ORDER — PHENOL 1.4 % MT LIQD: 1.0000 | OROMUCOSAL | Status: DC | PRN

## 2019-03-28 MED ORDER — METOCLOPRAMIDE HCL 5 MG/ML IJ SOLN: 5.0000 mg | Freq: Three times a day (TID) | INTRAMUSCULAR | Status: DC | PRN

## 2019-03-28 MED ORDER — KETOROLAC TROMETHAMINE 30 MG/ML IJ SOLN
INTRAMUSCULAR | Status: DC | PRN
  Administered 2019-03-28: 30 mg via INTRAVENOUS

## 2019-03-28 MED ORDER — FENTANYL CITRATE (PF) 100 MCG/2ML IJ SOLN
INTRAMUSCULAR | Status: AC
  Filled 2019-03-28: qty 2

## 2019-03-28 MED ORDER — ONDANSETRON HCL 4 MG PO TABS
4.0000 mg | ORAL_TABLET | Freq: Four times a day (QID) | ORAL | Status: DC | PRN
  Filled 2019-03-28: qty 1

## 2019-03-28 MED ORDER — MAGNESIUM CITRATE PO SOLN: 1.0000 | Freq: Once | ORAL | Status: DC | PRN

## 2019-03-28 MED ORDER — SACUBITRIL-VALSARTAN 24-26 MG PO TABS
1.0000 | ORAL_TABLET | Freq: Two times a day (BID) | ORAL | Status: DC
  Administered 2019-03-28 – 2019-03-29 (×2): 1 via ORAL
  Filled 2019-03-28 (×2): qty 1

## 2019-03-28 MED ORDER — KETOROLAC TROMETHAMINE 30 MG/ML IJ SOLN
INTRAMUSCULAR | Status: AC
  Filled 2019-03-28: qty 1

## 2019-03-28 MED ORDER — CARVEDILOL 12.5 MG PO TABS
12.5000 mg | ORAL_TABLET | Freq: Two times a day (BID) | ORAL | Status: DC
  Administered 2019-03-28 – 2019-03-29 (×2): 12.5 mg via ORAL
  Filled 2019-03-28 (×2): qty 1

## 2019-03-28 SURGICAL SUPPLY — 64 items
BAG ZIPLOCK 12X15 (MISCELLANEOUS) ×2 IMPLANT
BANDAGE ESMARK 6X9 LF (GAUZE/BANDAGES/DRESSINGS) ×1 IMPLANT
BLADE SAW SGTL 11.0X1.19X90.0M (BLADE) IMPLANT
BLADE SAW SGTL 13.0X1.19X90.0M (BLADE) ×2 IMPLANT
BLADE SAW SGTL 81X20 HD (BLADE) ×2 IMPLANT
BNDG ELASTIC 6X5.8 VLCR STR LF (GAUZE/BANDAGES/DRESSINGS) ×2 IMPLANT
BNDG ESMARK 6X9 LF (GAUZE/BANDAGES/DRESSINGS) ×2
BOWL SMART MIX CTS (DISPOSABLE) IMPLANT
BRUSH FEMORAL CANAL (MISCELLANEOUS) ×1 IMPLANT
CEMENT HV SMART SET (Cement) ×6 IMPLANT
COVER SURGICAL LIGHT HANDLE (MISCELLANEOUS) ×2 IMPLANT
COVER WAND RF STERILE (DRAPES) IMPLANT
CUFF TOURN SGL QUICK 34 (TOURNIQUET CUFF) ×1
CUFF TRNQT CYL 34X4.125X (TOURNIQUET CUFF) ×1 IMPLANT
DERMABOND ADVANCED (GAUZE/BANDAGES/DRESSINGS) ×1
DERMABOND ADVANCED .7 DNX12 (GAUZE/BANDAGES/DRESSINGS) ×1 IMPLANT
DRAPE POUCH INSTRU U-SHP 10X18 (DRAPES) ×2 IMPLANT
DRAPE SHEET LG 3/4 BI-LAMINATE (DRAPES) ×2 IMPLANT
DRAPE U-SHAPE 47X51 STRL (DRAPES) ×2 IMPLANT
DRESSING AQUACEL AG SP 3.5X10 (GAUZE/BANDAGES/DRESSINGS) ×1 IMPLANT
DRSG AQUACEL AG ADV 3.5X10 (GAUZE/BANDAGES/DRESSINGS) ×1 IMPLANT
DRSG AQUACEL AG SP 3.5X10 (GAUZE/BANDAGES/DRESSINGS) ×2
DURAPREP 26ML APPLICATOR (WOUND CARE) ×4 IMPLANT
ELECT REM PT RETURN 15FT ADLT (MISCELLANEOUS) ×2 IMPLANT
FACESHIELD WRAPAROUND (MASK) ×10 IMPLANT
FACESHIELD WRAPAROUND OR TEAM (MASK) ×5 IMPLANT
FEMUR SIGMA PS SZ 4.0 R (Femur) ×1 IMPLANT
GLOVE BIOGEL M 7.0 STRL (GLOVE) IMPLANT
GLOVE BIOGEL PI IND STRL 7.5 (GLOVE) ×1 IMPLANT
GLOVE BIOGEL PI IND STRL 8.5 (GLOVE) ×1 IMPLANT
GLOVE BIOGEL PI INDICATOR 7.5 (GLOVE) ×1
GLOVE BIOGEL PI INDICATOR 8.5 (GLOVE) ×1
GLOVE ECLIPSE 8.0 STRL XLNG CF (GLOVE) ×4 IMPLANT
GLOVE ORTHO TXT STRL SZ7.5 (GLOVE) ×4 IMPLANT
GOWN STRL REUS W/TWL LRG LVL3 (GOWN DISPOSABLE) ×2 IMPLANT
GOWN STRL REUS W/TWL XL LVL3 (GOWN DISPOSABLE) ×2 IMPLANT
HANDPIECE INTERPULSE COAX TIP (DISPOSABLE) ×1
KIT TURNOVER KIT A (KITS) IMPLANT
MANIFOLD NEPTUNE II (INSTRUMENTS) ×2 IMPLANT
MARKER SKIN DUAL TIP RULER LAB (MISCELLANEOUS) ×2 IMPLANT
NDL SAFETY ECLIPSE 18X1.5 (NEEDLE) ×1 IMPLANT
NEEDLE HYPO 18GX1.5 SHARP (NEEDLE) ×1
NS IRRIG 1000ML POUR BTL (IV SOLUTION) ×2 IMPLANT
PLATE ROT INSERT 15MM SIZE 4 (Plate) ×1 IMPLANT
PROTECTOR NERVE ULNAR (MISCELLANEOUS) ×2 IMPLANT
SET HNDPC FAN SPRY TIP SCT (DISPOSABLE) ×1 IMPLANT
SET PAD KNEE POSITIONER (MISCELLANEOUS) ×2 IMPLANT
SPONGE LAP 18X18 RF (DISPOSABLE) ×2 IMPLANT
STAPLER VISISTAT 35W (STAPLE) IMPLANT
SUCTION FRAZIER HANDLE 12FR (TUBING) ×1
SUCTION TUBE FRAZIER 12FR DISP (TUBING) ×1 IMPLANT
SUT MNCRL AB 4-0 PS2 18 (SUTURE) ×2 IMPLANT
SUT VIC AB 1 CT1 36 (SUTURE) ×4 IMPLANT
SUT VIC AB 2-0 CT1 27 (SUTURE) ×3
SUT VIC AB 2-0 CT1 TAPERPNT 27 (SUTURE) ×3 IMPLANT
SUT VLOC 180 0 24IN GS25 (SUTURE) IMPLANT
SYR 50ML LL SCALE MARK (SYRINGE) ×2 IMPLANT
TOWEL OR 17X26 10 PK STRL BLUE (TOWEL DISPOSABLE) ×4 IMPLANT
TOWEL OR NON WOVEN STRL DISP B (DISPOSABLE) ×2 IMPLANT
TOWER CARTRIDGE SMART MIX (DISPOSABLE) ×2 IMPLANT
TRAY FOLEY MTR SLVR 16FR STAT (SET/KITS/TRAYS/PACK) ×2 IMPLANT
WATER STERILE IRR 1000ML POUR (IV SOLUTION) ×2 IMPLANT
WRAP KNEE MAXI GEL POST OP (GAUZE/BANDAGES/DRESSINGS) ×2 IMPLANT
YANKAUER SUCT BULB TIP 10FT TU (MISCELLANEOUS) ×2 IMPLANT

## 2019-03-28 NOTE — Progress Notes (Signed)
AssistedDr. Ossey with right, ultrasound guided, adductor canal block. Side rails up, monitors on throughout procedure. See vital signs in flow sheet. Tolerated Procedure well.  

## 2019-03-28 NOTE — Progress Notes (Signed)
PHARMACY CONSULT NOTE FOR:  OUTPATIENT  PARENTERAL ANTIBIOTIC THERAPY (OPAT)  Indication: Prosthetic joint infection Regimen: Cefazolin 2 g IV q8h End date: 05/09/19  IV antibiotic discharge orders are pended. To discharging provider:  please sign these orders via discharge navigator,  Select New Orders & click on the button choice - Manage This Unsigned Work.     Thank you for allowing pharmacy to be a part of this patient's care.  Lenis Noon, PharmD 03/28/2019, 4:40 PM

## 2019-03-28 NOTE — Interval H&P Note (Signed)
History and Physical Interval Note:  03/28/2019 12:42 PM  [name]  has presented today for surgery, with the diagnosis of Infected Right total knee.  The various methods of treatment have been discussed with the patient and family. After consideration of risks, benefits and other options for treatment, the patient has consented to  Procedure(s) with comments: EXCISIONAL TOTAL KNEE ARTHROPLASTY WITH ANTIBIOTIC SPACERS (Right) - 90 mins as a surgical intervention.  The patient's history has been reviewed, patient examined, no change in status, stable for surgery.  I have reviewed the patient's chart and labs.  Questions were answered to the patient's satisfaction.     Mauri Pole

## 2019-03-28 NOTE — Transfer of Care (Signed)
Immediate Anesthesia Transfer of Care Note  Patient: [name].  Procedure(s) Performed: EXCISIONAL TOTAL KNEE ARTHROPLASTY WITH ANTIBIOTIC SPACERS (Right )  Patient Location: PACU  Anesthesia Type:Spinal  Level of Consciousness: awake, alert , oriented and patient cooperative  Airway & Oxygen Therapy: Patient Spontanous Breathing and Patient connected to face mask oxygen  Post-op Assessment: Report given to RN and Post -op Vital signs reviewed and stable  Post vital signs: stable  Last Vitals:  Vitals Value Taken Time  BP 112/79 03/28/19 1631  Temp 36.4 C 03/28/19 1630  Pulse 76 03/28/19 1642  Resp 16 03/28/19 1642  SpO2 100 % 03/28/19 1642  Vitals shown include unvalidated device data.  Last Pain:  Vitals:   03/28/19 1630  TempSrc:   PainSc: 0-No pain      Patients Stated Pain Goal: 3 (32/12/24 8250)  Complications: No apparent anesthesia complications

## 2019-03-28 NOTE — Anesthesia Preprocedure Evaluation (Addendum)
Anesthesia Evaluation  Patient identified by MRN, date of birth, ID band Patient awake    Reviewed: Allergy & Precautions, NPO status , Patient's Chart, lab work & pertinent test results  Airway Mallampati: III  TM Distance: >3 FB     Dental  (+) Dental Advisory Given   Pulmonary sleep apnea and Continuous Positive Airway Pressure Ventilation ,    breath sounds clear to auscultation       Cardiovascular hypertension, + dysrhythmias Atrial Fibrillation  Rhythm:Irregular Rate:Normal  Stress Test 2019 Nuclear stress EF: 28%. There was no ST segment deviation noted during stress. This is an intermediate risk study. The left ventricular ejection fraction is severely decreased (<30%). Findings consistent with prior myocardial infarction. 1. EF 28%, diffuse hypokinesis appearing worse towards the apex.  2. Fixed small, moderate intensity apical inferior and apical perfusion defect.  This is suggestive of prior infarction, no significant ischemia.   TTE 2019 - The patient was in atrial fibrillation. Normal LV size with mild LVH. EF 35-40%, diffuse hypokinesis. Mildly dilated RV with normal systolic function. No significant valvular abnormalities. Moderately dilated ascending aorta.   Neuro/Psych TIAnegative psych ROS   GI/Hepatic Neg liver ROS, GERD  Medicated,  Endo/Other  negative endocrine ROSdiabetes, Type 2  Renal/GU Renal InsufficiencyRenal disease  negative genitourinary   Musculoskeletal  (+) Arthritis ,   Abdominal   Peds  Hematology negative hematology ROS (+)   Anesthesia Other Findings   Reproductive/Obstetrics                            Anesthesia Physical Anesthesia Plan  ASA: IV  Anesthesia Plan: Spinal   Post-op Pain Management:    Induction:   PONV Risk Score and Plan: 1 and Treatment may vary due to age or medical condition, Propofol infusion and  Ondansetron  Airway Management Planned: Natural Airway and Simple Face Mask  Additional Equipment:   Intra-op Plan:   Post-operative Plan:   Informed Consent: I have reviewed the patients History and Physical, chart, labs and discussed the procedure including the risks, benefits and alternatives for the proposed anesthesia with the patient or authorized representative who has indicated his/her understanding and acceptance.     Dental advisory given  Plan Discussed with: CRNA  Anesthesia Plan Comments:        Anesthesia Quick Evaluation

## 2019-03-28 NOTE — Anesthesia Procedure Notes (Signed)
Procedure Name: MAC Date/Time: 03/28/2019 2:02 PM Performed by: Lissa Morales, CRNA Pre-anesthesia Checklist: Patient identified, Emergency Drugs available, Suction available and Patient being monitored Patient Re-evaluated:Patient Re-evaluated prior to induction Oxygen Delivery Method: Simple face mask Placement Confirmation: positive ETCO2

## 2019-03-28 NOTE — Anesthesia Procedure Notes (Signed)
Anesthesia Regional Block: Adductor canal block   Pre-Anesthetic Checklist: ,, timeout performed, Correct Patient, Correct Site, Correct Laterality, Correct Procedure, Correct Position, site marked, Risks and benefits discussed,  Surgical consent,  Pre-op evaluation,  At surgeon's request and post-op pain management  Laterality: Right  Prep: chloraprep       Needles:  Injection technique: Single-shot  Needle Type: Echogenic Stimulator Needle     Needle Length: 9cm  Needle Gauge: 21     Additional Needles:   Narrative:  Start time: 03/28/2019 1:25 PM End time: 03/28/2019 1:35 PM Injection made incrementally with aspirations every 5 mL.  Performed by: Personally  Anesthesiologist: Lillia Abed, MD  Additional Notes: Monitors applied. Patient sedated. Sterile prep and drape,hand hygiene and sterile gloves were used. Relevant anatomy identified.Needle position confirmed.Local anesthetic injected incrementally after negative aspiration. Local anesthetic spread visualized around nerve(s). Vascular puncture avoided. No complications. Image printed for medical record.The patient tolerated the procedure well.    Lillia Abed MD

## 2019-03-28 NOTE — Op Note (Signed)
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  PROCEDURE:  Resection arthroplasty with placement of an articulating antibiotic spacer.  SURGEON:  William Cancel, MD  ASSISTANT:  Danae Orleans PA-C.  Note that Mr. [name] was present for the entirety of the case from preoperative positioning, perioperative management of the operative extremity, general facilitation of the case and primary wound closure.  ANESTHESIA:  Regional plus spinal block anesthesia.  SPECIMENS:  We sent joint fluid and tissue from the right knee for Gram stain, aerobic, anaerobic evaluation to pathology.  DRAINS:  None.  ESTIMATED BLOOD LOSS:  Probably around 200 mL.  TOURNIQUET:  Up for 43 minutes at 250 mmHg.  INDICATIONS  FOR PROCEDURE:  The patient is a very pleasant 58 year old male.  He has now had a history of bilateral total knee replacements.  His right knee was performed first and he had done exceptionally well.  Proceeding on with his left knee.   Approximately 2-3 months after his left total knee replacement was complete, his right knee began bothering him.  He was treated acutely for infection and was identified based the acute onset of his symptoms.  We tried to salvage the joint, particularly  since he had a methicillin-sensitive Staphylococcus aureus  present.  He was on 6 weeks of IV antibiotics following I and D and poly exchange followed by 6 months of oral doxycycline and rifampin.  Unfortunately, he stopped his antibiotics at 6 months  after having discussion with his infectious disease physician.  Within a week, he began having a symptomatic  increasing right knee pain.  I aspirated his knee in the office, which revealed cloudy fluid.  It was sent to pathology identifying greater than  35,000 white cells.  Given these findings, it was recommended at this point, we proceed with resection arthroplasty.  Procedure itself as well as the postoperative course was reviewed and discussed, the need for IV antibiotics reviewed.  Consent was  obtained for management of the infection.  DESCRIPTION OF PROCEDURE:  The patient was brought to the operative theater.  Preoperative antibiotics had already been administered including 3 g of Ancef due to the fact that cultures had been sent in the office setting and he had been on Keflex  preoperatively.  The right lower extremity was then prepped and draped in sterile fashion.  Timeout was performed identifying the patient, the planned procedure and extremity.  Note that tranexamic acid was given.  Following the timeout, the leg was  exsanguinated, tourniquet elevated to 250 mmHg.  His old incision was excised.  Soft tissue planes were created.  Median arthrotomy was made encountering slightly purulent blood-tinged fluid.  I aspirated 80 mL of this fluid and sent to pathology for  Gram stain, aerobic, anaerobic evaluation.  At this point, we did perform a synovectomy, medial, lateral and suprapatellar.  Tissue was sent to pathology for further evaluation.  With the knee exposed, I used a thin oscillating saw undermining the bone  cement interface of the tibia and the femur.  The 2 components were removed.  Excess cement was removed.  I elected to keep the patella in place, but I did perform a complete synovectomy and scar around this area.  Once all  the components were removed,  we opened the canal was then reamed to 15 mm for potential stem for the next procedure.  I then used a canal brush irrigator and irrigated the canal with about a liter of normal saline solution.  We then irrigated the remainder portion of  the knee with 2  liters.  I then checked and assessed the gap and felt that a 15 mm insert would work best.  The size of the femur seemed to be best with a size 4 Sigma.  We then used IrriSept 500 mL and placed this into the knee and kept it in the knee for 7 minutes.   Once this was done, we mixed cement, which was 3 batches of cement mixed with 3 liters of vancomycin and 3.6 grams of tobramycin.  With these, the components "sloppily" cemented into place with the intention for hopeful easy removal.  I did place some  cement into the canals on the distal femur and proximal tibia.  The knee was brought to extension while the cement cured.  While the cement cured, I placed another 300 mL of IrriSept chlorhexidine fluid in the knee for 3-4 minutes.  We then irrigated the  knee with another 3 liters normal saline solution.  Once the cement fully cured and excess cement removed.  The knee was brought to flexion and extensor mechanism, reapproximated using #1 Vicryl and Stratafix suture.  The remaining wound was closed with  2-0 Vicryl and a running Monocryl stitch.  The knee was then cleaned, dried and dressed sterilely using surgical glue and Aquacel dressing.    He was then brought to the recovery room in stable condition, tolerating the procedure well.    Findings were reviewed with his wife.  He will be seen in the hospital by infectious disease.  We will get a PICC line ordered.  I will have him discharged within a 2-4 day the day depending on antibiotic selection  AN/NUANCE  D:03/28/2019 T:03/28/2019 JOB:[id number]

## 2019-03-28 NOTE — Brief Op Note (Signed)
03/28/2019  2:30 PM  PATIENT:  [name].  58 y.o. male  PRE-OPERATIVE DIAGNOSIS:  Recurrent/persistnet septic right total knee replacement  POST-OPERATIVE DIAGNOSIS:  Recurrent/persistnet septic right total knee replacement  PROCEDURE:  Procedure(s) with comments: EXCISIONAL TOTAL KNEE ARTHROPLASTY WITH ANTIBIOTIC SPACERS (Right) - 90 mins  SURGEON:  Surgeon(s) and Role:    [name], MD - Primary  PHYSICIAN ASSISTANT: Danae Orleans, PA-C  ANESTHESIA:   regional and spinal  EBL:  <200cc  BLOOD ADMINISTERED:none  DRAINS: none   LOCAL MEDICATIONS USED:  MARCAINE     SPECIMEN:  Source of Specimen:  right knee  DISPOSITION OF SPECIMEN:  PATHOLOGY  COUNTS:  YES  TOURNIQUET:  43 min at 250 mmHg  DICTATION: .Other Dictation: Dictation Number [id number]  PLAN OF CARE: Admit to inpatient   PATIENT DISPOSITION:  PACU - hemodynamically stable.   Delay start of Pharmacological VTE agent (>24hrs) due to surgical blood loss or risk of bleeding: no

## 2019-03-28 NOTE — Progress Notes (Signed)
Patient ID: William Barges., male   DOB: 11/03/1960, 58 y.o.   MRN: [id number]         [facility] for Infectious Disease  Date of Admission:  03/28/2019      ASSESSMENT: Unfortunately, [name] had a early relapse of his MSSA right prosthetic knee infection following 6 months of antibiotic therapy.  It will be much easier to cure infection now that he is undergone excision arthroplasty.  I will proceed with PICC placement and plan on a 6-week course of IV cefazolin.  PLAN: 1. PICC placement 2. Cefazolin 2 g IV every 8 hours 3. Please call if I can be of further assistance while [name] is here  Diagnosis: Right prosthetic knee infection  Culture Result: MSSA  Allergies  Allergen Reactions  . 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 chlorhexidine  60 mL Topical Once  . dexamethasone (DECADRON) injection  10 mg Intravenous Once   Continuous Infusions: .  ceFAZolin (ANCEF) IV    . lactated ringers 50 mL/hr at 03/28/19 1311  . tranexamic acid     PRN Meds:.   SUBJECTIVE: [name] underwent right total knee arthroplasty on 05/31/2018.  He had no problems healing postoperatively.  He underwent left total knee arthroplasty on 06/28/2018.   He did have a superficial stitch abscess which required some wound care and 2 rounds of antibiotics before it finally healed. 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  He received 6 weeks of IV cefazolin with oral rifampin before converting to oral cephalexin and rifampin.  He completed 6 months of total antibiotic therapy on 03/16/2019.  He was doing very well with no residual signs of infection and we decided that it was time to stop antibiotic therapy and see how he does.  He relapsed very quickly on 03/22/2019 with return of severe pain and swelling of his right knee.  I started him back on oral cephalexin.  Dr. Alvan Dame told me that his knee was aspirated in the office showing about 35,000 white blood cells.  Cultures are growing staph aureus again.  He was readmitted today and underwent excision arthroplasty and spacer placement.  Review of Systems: Review of Systems  Constitutional: Negative for chills, diaphoresis and fever.  Musculoskeletal: Positive for joint pain.    Allergies  Allergen Reactions  . Morphine And Related Other (See Comments)    Hallucinations    OBJECTIVE: Vitals:   03/28/19 1336 03/28/19 1337 03/28/19 1338 03/28/19 1339  BP:  120/80    Pulse: 79 80 84 64  Resp: _0 Temp:      TempSrc:      SpO2: 100% 100% 99% 99%  Weight:      Height:       Body mass index is 37.9 kg/m.  Physical Exam Constitutional:      Comments: He is alert and in good spirits talking with his nurse in the PACU.     Lab Results Lab Results  Component Value Date   WBC 9.7 03/23/2019   HGB 18.2 (H) 03/23/2019   HCT 50.9 03/23/2019   MCV 90 03/23/2019   PLT 138 (L) 03/23/2019    Lab Results  Component Value Date   CREATININE 1.33 (H) 03/23/2019   BUN 23 03/23/2019   NA 133 (L) 03/23/2019   K 4.5 03/23/2019   CL 94 (L) 03/23/2019   CO2 19 (L) 03/23/2019    Lab Results  Component Value  Date   ALT 43 03/23/2019   AST 24 03/23/2019   ALKPHOS 52 03/23/2019   BILITOT 2.0 (H) 03/23/2019     Microbiology: Recent Results (from the past 240 hour(s))  SARS Coronavirus 2 (Performed in [place] hospital lab)     Status: None   Collection Time: 03/24/19 12:35 PM   Specimen: Nasal Swab  Result Value Ref Range Status   SARS Coronavirus 2 NEGATIVE NEGATIVE Final    Comment: (NOTE) SARS-CoV-2 target nucleic acids are NOT DETECTED. The SARS-CoV-2 RNA is generally detectable in upper and lower respiratory specimens during the acute phase of infection. Negative results do not preclude SARS-CoV-2 infection, do not rule out co-infections with other pathogens, and should not be used as the sole basis for treatment or other patient management decisions. Negative results must be combined with clinical observations, patient history, and epidemiological information. The expected result is Negative. Fact Sheet for Patients: SugarRoll.be Fact Sheet for Healthcare Providers: https://www.woods-mathews.com/ This test is not yet approved or cleared by the [place] FDA and  has been authorized for detection and/or diagnosis of SARS-CoV-2 by FDA under an Emergency Use Authorization (EUA). This EUA will remain  in effect (meaning this test can be used) for the duration of the COVID-19 declaration under Section 56 4(b)(1) of the Act, 21 U.S.C. section 360bbb-3(b)(1), unless the authorization is terminated or revoked sooner. Performed at [facility], [place] [address] [place], [place] [address]     Michel Bickers, [place] [facility] [phone] pager   [phone] cell 03/28/2019, 2:49 PM

## 2019-03-28 NOTE — Anesthesia Postprocedure Evaluation (Signed)
Anesthesia Post Note  Patient: [name].  Procedure(s) Performed: EXCISIONAL TOTAL KNEE ARTHROPLASTY WITH ANTIBIOTIC SPACERS (Right )     Patient location during evaluation: PACU Anesthesia Type: Spinal Level of consciousness: awake and alert Pain management: pain level controlled Vital Signs Assessment: post-procedure vital signs reviewed and stable Respiratory status: spontaneous breathing and respiratory function stable Cardiovascular status: blood pressure returned to baseline and stable Postop Assessment: spinal receding Anesthetic complications: no    Last Vitals:  Vitals:   03/28/19 1747 03/28/19 1846  BP: 119/82 117/80  Pulse: 62 65  Resp: 15 15  Temp: 36.6 C   SpO2: 100% 99%    Last Pain:  Vitals:   03/28/19 1730  TempSrc:   PainSc: 0-No pain                 Tiajuana Amass

## 2019-03-28 NOTE — Progress Notes (Signed)
Peripherally Inserted Central Catheter/Midline Placement  The IV Nurse has discussed with the patient and/or persons authorized to consent for the patient, the purpose of this procedure and the potential benefits and risks involved with this procedure.  The benefits include less needle sticks, lab draws from the catheter, and the patient may be discharged home with the catheter. Risks include, but not limited to, infection, bleeding, blood clot (thrombus formation), and puncture of an artery; nerve damage and irregular heartbeat and possibility to perform a PICC exchange if needed/ordered by physician.  Alternatives to this procedure were also discussed.  Bard Power PICC patient education guide, fact sheet on infection prevention and patient information card has been provided to patient /or left at bedside.    PICC/Midline Placement Documentation  PICC Single Lumen 43/15/40 PICC Right Basilic 42 cm 0 cm (Active)  Indication for Insertion or Continuance of Line Home intravenous therapies (PICC only) 03/28/19 1930  Exposed Catheter (cm) 0 cm 03/28/19 1930  Site Assessment Clean;Dry;Intact 03/28/19 1930  Line Status Flushed;Saline locked;Blood return noted 03/28/19 1930  Dressing Type Transparent 03/28/19 1930  Dressing Status Clean;Dry;Intact;Antimicrobial disc in place 03/28/19 1930  Dressing Change Due 04/04/19 03/28/19 1930       Gordan Payment 03/28/2019, 8:03 PM

## 2019-03-28 NOTE — Anesthesia Procedure Notes (Signed)
Spinal  Patient location during procedure: OR End time: 03/28/2019 2:09 PM Staffing Resident/CRNA: Lissa Morales, CRNA Performed: anesthesiologist  Preanesthetic Checklist Completed: patient identified, site marked, surgical consent, pre-op evaluation, timeout performed, IV checked, risks and benefits discussed and monitors and equipment checked Spinal Block Patient position: sitting Prep: DuraPrep Patient monitoring: heart rate, continuous pulse ox and blood pressure Approach: midline Location: L3-4 Injection technique: single-shot Needle Needle type: Pencan  Needle gauge: 24 G Needle length: 9 cm Assessment Sensory level: T4 Additional Notes Expiration date of kit checked and confirmed. Patient tolerated procedure well, without complications.

## 2019-03-29 ENCOUNTER — Encounter (HOSPITAL_COMMUNITY): Payer: Self-pay | Admitting: Orthopedic Surgery

## 2019-03-29 ENCOUNTER — Ambulatory Visit: Payer: 59 | Admitting: Internal Medicine

## 2019-03-29 DIAGNOSIS — A4901 Methicillin susceptible Staphylococcus aureus infection, unspecified site: Secondary | ICD-10-CM | POA: Diagnosis not present

## 2019-03-29 LAB — CBC
HCT: 45.1 % (ref 39.0–52.0)
Hemoglobin: 15.7 g/dL (ref 13.0–17.0)
MCH: 31.7 pg (ref 26.0–34.0)
MCHC: 34.8 g/dL (ref 30.0–36.0)
MCV: 90.9 fL (ref 80.0–100.0)
Platelets: 160 10*3/uL (ref 150–400)
RBC: 4.96 MIL/uL (ref 4.22–5.81)
RDW: 13 % (ref 11.5–15.5)
WBC: 7 10*3/uL (ref 4.0–10.5)
nRBC: 0 % (ref 0.0–0.2)

## 2019-03-29 LAB — BASIC METABOLIC PANEL
Anion gap: 10 (ref 5–15)
BUN: 19 mg/dL (ref 6–20)
CO2: 18 mmol/L — ABNORMAL LOW (ref 22–32)
Calcium: 8.5 mg/dL — ABNORMAL LOW (ref 8.9–10.3)
Chloride: 107 mmol/L (ref 98–111)
Creatinine, Ser: 0.86 mg/dL (ref 0.61–1.24)
GFR calc Af Amer: >60 mL/min (ref >60)
GFR calc non Af Amer: >60 mL/min (ref >60)
Glucose, Bld: 201 mg/dL — ABNORMAL HIGH (ref 70–99)
Potassium: 4.3 mmol/L (ref 3.5–5.1)
Sodium: 135 mmol/L (ref 135–145)

## 2019-03-29 MED ORDER — FERROUS SULFATE 325 (65 FE) MG PO TABS: 325.0000 mg | ORAL_TABLET | Freq: Three times a day (TID) | ORAL | 0 refills | Status: DC

## 2019-03-29 MED ORDER — OXYCODONE HCL 5 MG PO TABS: 5.0000 mg | ORAL_TABLET | ORAL | 0 refills | Status: DC | PRN

## 2019-03-29 MED ORDER — CEFAZOLIN IV (FOR PTA / DISCHARGE USE ONLY): 2.0000 g | Freq: Three times a day (TID) | INTRAVENOUS | 0 refills | Status: AC

## 2019-03-29 MED ORDER — METHOCARBAMOL 500 MG PO TABS: 500.0000 mg | ORAL_TABLET | Freq: Four times a day (QID) | ORAL | 0 refills | Status: DC | PRN

## 2019-03-29 MED ORDER — POLYETHYLENE GLYCOL 3350 17 G PO PACK: 17.0000 g | PACK | Freq: Two times a day (BID) | ORAL | 0 refills | Status: DC

## 2019-03-29 MED ORDER — DOCUSATE SODIUM 100 MG PO CAPS: 100.0000 mg | ORAL_CAPSULE | Freq: Two times a day (BID) | ORAL | 0 refills | Status: DC

## 2019-03-29 MED ORDER — ACETAMINOPHEN 500 MG PO TABS: 1000.0000 mg | ORAL_TABLET | Freq: Three times a day (TID) | ORAL | 0 refills | Status: DC

## 2019-03-29 MED FILL — METHOCARBAMOL 500 MG TABS: 500 | 10 days supply | Qty: 40 | Fill #0

## 2019-03-29 MED FILL — oxyCODONE HCL 5 MG TABS: 5 | 5 days supply | Qty: 60 | Fill #0

## 2019-03-29 MED FILL — SM CLEARLAX POWDER: 17 | 14 days supply | Qty: 510 | Fill #0

## 2019-03-29 NOTE — Progress Notes (Signed)
Pt discharged home with all belongings, including home CPAP machine and paper rx. All discharge education completed and reviewed with pt. Pt voices no concerns or questions at this time. Reviewed incision care at home as well as PICC care at home. No further needs at this time.

## 2019-03-29 NOTE — Evaluation (Signed)
Physical Therapy Evaluation Patient Details Name: [name]. MRN: [id number] DOB: [birth year] Today's Date: 03/29/2019   History of Present Illness  EXCISIONAL TOTAL KNEE ARTHROPLASTY WITH ANTIBIOTIC SPACERS (Right)  Clinical Impression  Pt presents with decreased mobility secondary to the above diagnosis. Pt is Mod independent with basic ambulation and mobility on the unit. Reinforced PWB and HEP consisting of SLR, quad setys, and Hip abd/add. Pt to follow-up with ortho. Pt verbalized safe technique with steps and has no further concerns. Pt is ready for d/c home today and will be d/c from acute PT services.    Follow Up Recommendations Follow surgeon's recommendation for DC plan and follow-up therapies    Equipment Recommendations  None recommended by PT    Recommendations for Other Services       Precautions / Restrictions Precautions Precautions: Knee Precaution Comments: articulating antibiotic spacer placed, knee components removed Restrictions RLE Weight Bearing: Partial weight bearing      Mobility  Bed Mobility Overal bed mobility: Independent                Transfers Overall transfer level: Independent Equipment used: Rolling walker (2 wheeled)                Ambulation/Gait Ambulation/Gait assistance: Modified independent (Device/Increase time) Gait Distance (Feet): 175 Feet Assistive device: Rolling walker (2 wheeled) Gait Pattern/deviations: Step-through pattern;Decreased stride length;Decreased weight shift to right        Stairs Stairs: (verbally reviewed technique, pt declined to demonstrate)          Wheelchair Mobility    Modified Rankin (Stroke Patients Only)       Balance Overall balance assessment: No apparent balance deficits (not formally assessed)                                           Pertinent Vitals/Pain Pain Assessment: 0-10 Pain Score: 3  Pain Location: r knee Pain Descriptors /  Indicators: Discomfort Pain Intervention(s): Limited activity within patient's tolerance;Monitored during session    Home Living Family/patient expects to be discharged to:: Private residence Living Arrangements: Spouse/significant other Available Help at Discharge: Family Type of Home: House Home Access: Stairs to enter     Home Layout: Two level Home Equipment: Environmental consultant - 2 wheels;Crutches      Prior Function Level of Independence: Independent               Hand Dominance        Extremity/Trunk Assessment   Upper Extremity Assessment Upper Extremity Assessment: Defer to OT evaluation    Lower Extremity Assessment Lower Extremity Assessment: Overall WFL for tasks assessed       Communication   Communication: No difficulties  Cognition Arousal/Alertness: Awake/alert Behavior During Therapy: WFL for tasks assessed/performed Overall Cognitive Status: Within Functional Limits for tasks assessed                                        General Comments General comments (skin integrity, edema, etc.): Pt has had l TKR, RTKR, battling infection with R TKR and multiple surgeries so he reports he is aware of mobility techniques and how to manage steps. Reinforced PWB and use of RW or crutches with mobility.    Exercises Total Joint Exercises Quad Sets: AROM;Strengthening;Right;10 reps;Supine  Hip ABduction/ADduction: AROM;Strengthening;Right;5 reps;Supine Straight Leg Raises: AROM;Strengthening;Right;5 reps;Supine   Assessment/Plan    PT Assessment Patent does not need any further PT services  PT Problem List         PT Treatment Interventions      PT Goals (Current goals can be found in the Care Plan section)  Acute Rehab PT Goals Patient Stated Goal: To d/c home today    Frequency     Barriers to discharge        Co-evaluation               AM-PAC PT "6 Clicks" Mobility  Outcome Measure   Help needed moving from lying on your  back to sitting on the side of a flat bed without using bedrails?: None Help needed moving to and from a bed to a chair (including a wheelchair)?: None Help needed standing up from a chair using your arms (e.g., wheelchair or bedside chair)?: None Help needed to walk in hospital room?: None Help needed climbing 3-5 steps with a railing? : None 6 Click Score: 20    End of Session Equipment Utilized During Treatment: Gait belt Activity Tolerance: Patient tolerated treatment well Patient left: in bed;with family/visitor present Nurse Communication: Mobility status PT Visit Diagnosis: Difficulty in walking, not elsewhere classified (R26.2)    Time: 7628-3151 PT Time Calculation (min) (ACUTE ONLY): 24 min   Charges:   PT Evaluation $PT Eval Low Complexity: 1 Low PT Treatments $Gait Training: 8-22 mins        Theodoro Grist, PT  Lelon Mast 03/29/2019, 9:13 AM

## 2019-03-29 NOTE — TOC Progression Note (Signed)
Transition of Care (TOC) - Progression Note    Patient Details  Name: [name]. MRN: [id number] Date of Birth: 1961-04-22  Transition of Care [facility]) CM/SW [place], LCSW Phone Number: 03/29/2019, 11:24 AM  Clinical Narrative:     Therapy plan: on a 6-week course of IV cefazolin. [facility] Rep Pam confirm IV antibiotics, supplies and initial instruction for home.  [facility] agency will provide RN.    Expected Discharge Plan: [place] Barriers to Discharge: No Barriers Identified  Expected Discharge Plan and Services Expected Discharge Plan: [place]         Expected Discharge Date: 03/29/19                         HH Arranged: RN, IV Antibiotics HH Agency: [facility], Other - See comment([place]) Date HH Agency Contacted: 03/29/19 Time HH Agency Contacted: 0900 Representative spoke with at [place]: [place] ([place]) Interventions    Readmission Risk Interventions No flowsheet data found.

## 2019-03-29 NOTE — Plan of Care (Signed)
  Problem: Education: Goal: Knowledge of General Education information will improve Description: Including pain rating scale, medication(s)/side effects and non-pharmacologic comfort measures Outcome: Progressing   Problem: Coping: Goal: Level of anxiety will decrease Outcome: Progressing   Problem: Pain Managment: Goal: General experience of comfort will improve Outcome: Progressing   Problem: Pain Management: Goal: Pain level will decrease with appropriate interventions Outcome: Progressing   

## 2019-03-29 NOTE — Progress Notes (Addendum)
     Subjective: 1 Day Post-Op Procedure(s) (LRB): EXCISIONAL TOTAL KNEE ARTHROPLASTY WITH ANTIBIOTIC SPACERS (Right)   Patient reports pain as mild, pain controlled with medication.  No reported events throughout the night.  Patient states that his wife works with Dr. Megan Salon and if at all possible he like to be able to be discharged today.  Patient already received his PICC line.  We will get everything ready and with Dr. Hale Bogus help try to assist in discharging the patient today as long as it is safe to do so.  Dr. Alvan Dame did discuss the procedures and findings as well as the expectations moving forward.   Objective:   VITALS:   Vitals:   03/29/19 0127 03/29/19 0549  BP:  (!) 142/88  Pulse: 70 61  Resp:  20  Temp:  97.8 F (36.6 C)  SpO2: 98% 99%    Dorsiflexion/Plantar flexion intact Incision: dressing C/D/I No cellulitis present Compartment soft  LABS Recent Labs    03/29/19 0315  HGB 15.7  HCT 45.1  WBC 7.0  PLT 160    Recent Labs    03/29/19 0315  NA 135  K 4.3  BUN 19  CREATININE 0.86  GLUCOSE 201*     Assessment/Plan: 1 Day Post-Op Procedure(s) (LRB): EXCISIONAL TOTAL KNEE ARTHROPLASTY WITH ANTIBIOTIC SPACERS (Right) Appreciate Dr. Megan Salon and his assistance with this patient Foley cath d/c'ed Advance diet Up with therapy D/C IV fluids Discharge home with home health if everything is able to be arranged Follow up in 2 weeks at [facility] ([place] Follow up with OLIN,Jenay Morici D in 2 weeks.  Contact information:  [facility]) [address], [place] [phone]    Obese (BMI 30-39.9) Estimated body mass index is 37.9 kg/m as calculated from the following:   Height as of this encounter: 5\' 10"  (1.778 m).   Weight as of this encounter: 119.8 kg. Patient also counseled that weight may inhibit the healing process Patient counseled that losing weight  will help with future health issues        [place] Pugh. Joliyah Lippens   PAC  03/29/2019, 8:42 AM

## 2019-03-30 ENCOUNTER — Other Ambulatory Visit: Payer: Self-pay | Admitting: *Deleted

## 2019-03-30 NOTE — Discharge Summary (Signed)
Physician Discharge Summary  Patient ID: [name]. MRN: [id number] DOB/AGE: 1961/04/12 58 y.o.  Admit date: 03/28/2019 Discharge date: 03/29/2019   Procedures:  Procedure(s) (LRB): EXCISIONAL TOTAL KNEE ARTHROPLASTY WITH ANTIBIOTIC SPACERS (Right)  Attending Physician:  Dr. 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 male complaining of right knee pain and swelling.   History of total knee arthroplasty on 05/31/18 with a I&D w/ poly exchange on 09/10/18.   He was off antibiotics for a short time prior to returning to the clinic because of pain and swelling.  An aspiration of his right knee was performed and the fluid off for evaluation. He had a previous MSSA infection of this right knee. We will evaluate for cultures. He was started on Keflex prior to evaluation.  Last CRP was 152 (0-10 reference) and sed rate 5 (0-30 reference).  Due to the persistence of his symptoms after treatment with antibiotics, various options  were again discussed.  It is decided that it would be best to remove the prosthesis and place and antibiotic spacer.  Patient wishes to proceed in this manner to treat the knee as soon as possible.  Various options are discussed with the patient. Risks, benefits and expectations were discussed with the patient. Patient understand the risks, benefits and expectations and wishes to proceed with surgery.   PCP: Denita Lung, MD   Discharged Condition: good  Hospital Course:  Patient underwent the above stated procedure on 03/28/2019. Patient tolerated the procedure well and brought to the recovery room in good condition and subsequently to the floor.  POD #1 BP: 142/88 ; Pulse: 61 ; Temp: 97.8 F (36.6 C) ; Resp: 20 Patient reports pain as mild, pain controlled with medication.  No reported events throughout the night.  Patient states that his wife works with Dr. Megan Salon and if at all possible he like to be able to be discharged today.  Patient already received his PICC line.  We will get everything ready and with Dr. Hale Bogus help try to assist in discharging the patient today as long as it is safe to do so.  Dr. Alvan Dame did discuss the procedures and findings as well as the expectations moving forward. Dorsiflexion/plantar flexion intact, incision: dressing C/D/I, no cellulitis present and compartment soft.   LABS  Basename    HGB     15.7  HCT     45.1    Discharge Exam:  General appearance: alert, cooperative and no distress Extremities: Homans sign is negative, no sign of DVT, no edema, redness or tenderness in the calves or thighs and no ulcers, gangrene or trophic changes  Disposition:  Home with follow up in 2 weeks   Follow-up Information    [name], MD. Schedule an appointment as soon as possible for a visit in 2 weeks.   Specialty: Orthopedic Surgery Contact information: [address] [place] [address] [phone]           Discharge  Instructions    Call MD / Call 911   Complete by: As directed    If you experience chest pain or shortness of breath, CALL 911 and be transported to the hospital emergency room.  If you develope a fever above 101 F, pus (white drainage) or increased drainage or redness at the wound, or calf pain, call your surgeon's office.   Change dressing   Complete by: As directed    Maintain surgical dressing until follow up in the clinic. If the edges start to pull up, may reinforce with tape. If the dressing is no longer working, may remove and cover with gauze and tape, but must keep the area dry and clean.  Call with any questions or concerns.   Constipation Prevention   Complete by: As directed    Drink plenty of fluids.  Prune juice may be helpful.  You may use a stool softener, such as Colace (over the counter) 100 mg twice a day.  Use MiraLax (over the counter) for constipation as needed.   Diet - low sodium heart healthy   Complete by: As directed    Discharge instructions   Complete by: As directed    Maintain surgical dressing until follow up in the clinic. If the edges start to pull up, may reinforce with tape. If the dressing is no longer working, may remove and cover with gauze and tape, but must keep the area dry and clean.  Follow up in 2 weeks at [facility]. Call with any questions or concerns.   Home infusion instructions [facility] May follow [place] Dosing Protocol; May administer Cathflo as needed to maintain patency of vascular access device.; Flushing of vascular access device: per [facility] Protocol: 0.9% NaCl pre/post medica...   Complete by: As directed    Instructions: May follow [place] Dosing Protocol   Instructions: May administer Cathflo as needed to maintain patency of vascular access device.   Instructions: Flushing of vascular access device: per [facility] Protocol: 0.9% NaCl pre/post medication administration and prn patency; Heparin 100 u/ml, 23m for  implanted ports and Heparin 10u/ml, 590mfor all other central venous catheters.   Instructions: May follow [facility] Anaphylaxis Protocol for First Dose Administration in the home: 0.9% NaCl at 25-50 ml/hr to maintain IV access for protocol meds. Epinephrine 0.3 ml IV/IM PRN and Benadryl 25-50 IV/IM PRN s/s of anaphylaxis.   Instructions: [facility] Coordinator (RN) to assist per patient IV care needs in the home PRN.   Partial weight bearing   Complete by: As directed    % Body Weight: 50   Laterality: right   Extremity: Lower   TED hose   Complete by: As directed    Use stockings (TED hose) for 2 weeks on both leg(s).  You may remove them at night for sleeping.      Allergies as of 03/29/2019      Reactions   Morphine  And Related Other (See Comments)   Hallucinations      Medication List    STOP taking these medications   cephALEXin 500 MG capsule Commonly known as: KEFLEX   enoxaparin 120 MG/0.8ML injection Commonly known as: LOVENOX   oxyCODONE-acetaminophen 10-325 MG tablet Commonly known as: PERCOCET     TAKE these medications   Accu-Chek FastClix Lancets Misc CHECK BLOOD SUGAR TWICE A DAY   acetaminophen 500 MG tablet Commonly known as: TYLENOL Take 2 tablets (1,000 mg total) by mouth every 8 (eight) hours.   carvedilol 12.5 MG tablet Commonly known as: COREG Take 12.5 mg by mouth 2 (two) times daily with a meal. What changed: Another medication with the same name was removed. Continue taking this medication, and follow the directions you see here.   ceFAZolin  IVPB Commonly known as: ANCEF Inject 2 g into the vein every 8 (eight) hours. Indication:  Prosthetic joint infection Last Day of Therapy:  05/09/19 Labs - Once weekly:  CBC/D and BMP, Labs - Every other week:  ESR and CRP   docusate sodium 100 MG capsule Commonly known as: Colace Take 1 capsule (100 mg total) by mouth 2 (two) times daily.   empagliflozin 10 MG Tabs tablet Commonly known  as: Jardiance Take 10 mg by mouth daily.   Entresto 24-26 MG Generic drug: sacubitril-valsartan Take 1 tablet by mouth 2 (two) times daily.   ferrous sulfate 325 (65 FE) MG tablet Commonly known as: FerrouSul Take 1 tablet (325 mg total) by mouth 3 (three) times daily with meals for 14 days.   glucose blood test strip Commonly known as: Accu-Chek Guide USE TO TEST BLOOD GLUCOSE TWICE DAILY   Ixekizumab 80 MG/ML Soaj Commonly known as: Taltz Inject 80 mg into the skin every 30 (thirty) days.   methocarbamol 500 MG tablet Commonly known as: Robaxin Take 1 tablet (500 mg total) by mouth every 6 (six) hours as needed for muscle spasms.   multivitamin with minerals Tabs tablet Take 1 tablet by mouth daily.   oxyCODONE 5 MG immediate release tablet Commonly known as: Oxy IR/ROXICODONE Take 1-2 tablets (5-10 mg total) by mouth every 4 (four) hours as needed for moderate pain or severe pain.   oxymetazoline 0.05 % nasal spray Commonly known as: AFRIN Place 1 spray into both nostrils at bedtime as needed for congestion.   pantoprazole 40 MG tablet Commonly known as: PROTONIX Take 1 tablet (40 mg total) by mouth 2 (two) times daily.   polyethylene glycol 17 g packet Commonly known as: MIRALAX / GLYCOLAX Take 17 g by mouth 2 (two) times daily.   polyvinyl alcohol 1.4 % ophthalmic solution Commonly known as: LIQUIFILM TEARS Place 1 drop into both eyes as needed for dry eyes.   rivaroxaban 20 MG Tabs tablet Commonly known as: XARELTO Take 20 mg by mouth daily after supper.   rosuvastatin 10 MG tablet Commonly known as: CRESTOR Take 1 tablet (10 mg total) by mouth daily.   Vitamin D (Cholecalciferol) 25 MCG (1000 UT) Caps Take 1,000 Units by mouth daily.            Home Infusion Instuctions  (From admission, onward)         Start     Ordered   03/29/19 0000  Home infusion instructions [facility] May follow [place] Dosing Protocol; May administer  Cathflo as needed to maintain patency of vascular access device.; Flushing of vascular access device: per [facility] Protocol: 0.9% NaCl pre/post  medica...    Question Answer Comment  Instructions May follow [place] Dosing Protocol   Instructions May administer Cathflo as needed to maintain patency of vascular access device.   Instructions Flushing of vascular access device: per [facility] Protocol: 0.9% NaCl pre/post medication administration and prn patency; Heparin 100 u/ml, 32m for implanted ports and Heparin 10u/ml, 568mfor all other central venous catheters.   Instructions May follow [facility] Anaphylaxis Protocol for First Dose Administration in the home: 0.9% NaCl at 25-50 ml/hr to maintain IV access for protocol meds. Epinephrine 0.3 ml IV/IM PRN and Benadryl 25-50 IV/IM PRN s/s of anaphylaxis.   Instructions [facility] Infusion Coordinator (RN) to assist per patient IV care needs in the home PRN.      03/29/19 0850           Discharge Care Instructions  (From admission, onward)         Start     Ordered   03/29/19 0000  Change dressing    Comments: Maintain surgical dressing until follow up in the clinic. If the edges start to pull up, may reinforce with tape. If the dressing is no longer working, may remove and cover with gauze and tape, but must keep the area dry and clean.  Call with any questions or concerns.   03/29/19 0941   03/29/19 0000  Partial weight bearing    Question Answer Comment  % Body Weight 50   Laterality right   Extremity Lower      03/29/19 0941           Signed: MaWest PughBabish   PA-C  03/30/2019, 9:57 AM

## 2019-03-30 NOTE — Patient Outreach (Signed)
[name] ([facility] Care Delgado  03/30/2019  [name]. 06/06/1961 [id number]   Transition of care call /Case Closure    Referral received : 03/29/2019 Initial outreach : 03/30/2019 Insurance : [place] plan   Subjective    Initial unsuccessful telephone outreach call to patient's preferred preferred number in order to complete transition of care call assessment ; no answer able to leave a HIPAA compliant voicemail message requesting a return call.   1615 Patient returned call from earlier outreach, explained purpose of the call to complete transition of care assessment ;2 HIPPA identifiers verified. Explained purpose of the call and completed transition of care assessment .  [name] says that he feels better this time than the last time he was in the Delgado . Patient discussed the his previous history of surgery on his right knee as William as prior antibiotic therapy that he has completed. He discussed this current surgery and plan for continued IV antibiotic for 6 weeks, He discussed his wife is able to help with infusion of antibiotics she has already administered 3 infusions since home health visit , reports that they have been through this before. Patient reports that surgical pain at knee is William managed , he has not required medication so far today but has it on hand if needed. He discussed having a dressing to knee intact , he is able to take a shower covering PICC with plastic ,  he reports using crutches to get around home . Patient discussed appetite has been good, and denies bladder or bowel problems. Patient discussed his recent A1c is 6.2 up from recent reading of  5.4%.; He discussed still being active with [name]  and reports being in contact with his health coach. Objective     [name] was hospitalized at [name] from 7/28 - 03/29/19  for Excisional total knee arthroplasty with antibiotic spacers. Right knee infection 09/10/2018  Pt had Right  total knee arthoplasty on 05/31/18  left total knee arthroplasty,06/28/2018  Co mobidities include: HTN , atrial fibrillation, OSA, GERD, Type 2 Diabetes, Hyperlipidemia, psoriatic arthritis and obesity.   He was discharged home on 7/ 29/20 with home health services for 6 weeks of antibiotic therapy via PICC line; [name] agency to supply IV antibiotic , supplies and initial instruction and [name] home health RN to follow for home visit .   Assessment  Patient voices good understanding of all discharges instructions  See transition of care flowsheet for assessment details    Plan Reviewed Delgado discharge diagnosis of excisional total knee arthroplasty with antibiotic spacer right  and treatment plan using [facility]gado discharge instructions, assessing medication adherence, reviewing postoperative problems requiring provider notification and discussing the importance of follow up with surgeon, primary care provider and specialist as directed.   No ongoing care Delgado needs identified so will close case to [name]  services and route successful outreach letter with [facility] Delgado pamphlet and 24 Hour Nurse Line Magnet to [place] [facility] clinical pool to be mailed to patient's home address.   [name], RN, [place] [facility] Coordinator  [phone]- Mobile [phone]- Toll Free Main Office

## 2019-03-31 DIAGNOSIS — A4901 Methicillin susceptible Staphylococcus aureus infection, unspecified site: Secondary | ICD-10-CM | POA: Diagnosis not present

## 2019-04-02 ENCOUNTER — Other Ambulatory Visit (HOSPITAL_COMMUNITY)
Admission: RE | Admit: 2019-04-02 | Discharge: 2019-04-02 | Disposition: A | Discharge status: Home / Self Care | Payer: 59 | Source: Ambulatory Visit | Attending: Orthopedic Surgery | Admitting: Orthopedic Surgery

## 2019-04-02 DIAGNOSIS — A4901 Methicillin susceptible Staphylococcus aureus infection, unspecified site: Secondary | ICD-10-CM | POA: Diagnosis not present

## 2019-04-02 LAB — C-REACTIVE PROTEIN: CRP: 1.5 mg/dL — ABNORMAL HIGH (ref <1.0)

## 2019-04-02 LAB — CBC WITH DIFFERENTIAL/PLATELET
Abs Immature Granulocytes: 0.3 10*3/uL — ABNORMAL HIGH (ref 0.00–0.07)
Basophils Absolute: 0.1 10*3/uL (ref 0.0–0.1)
Basophils Relative: 1 %
Eosinophils Absolute: 0.1 10*3/uL (ref 0.0–0.5)
Eosinophils Relative: 2 %
HCT: 43.9 % (ref 39.0–52.0)
Hemoglobin: 15.7 g/dL (ref 13.0–17.0)
Immature Granulocytes: 5 %
Lymphocytes Relative: 27 %
Lymphs Abs: 1.7 10*3/uL (ref 0.7–4.0)
MCH: 32.3 pg (ref 26.0–34.0)
MCHC: 35.8 g/dL (ref 30.0–36.0)
MCV: 90.3 fL (ref 80.0–100.0)
Monocytes Absolute: 0.5 10*3/uL (ref 0.1–1.0)
Monocytes Relative: 7 %
Neutro Abs: 3.8 10*3/uL (ref 1.7–7.7)
Neutrophils Relative %: 58 %
Platelets: 283 10*3/uL (ref 150–400)
RBC: 4.86 MIL/uL (ref 4.22–5.81)
RDW: 13.2 % (ref 11.5–15.5)
WBC: 6.5 10*3/uL (ref 4.0–10.5)
nRBC: 0 % (ref 0.0–0.2)

## 2019-04-02 LAB — BASIC METABOLIC PANEL
Anion gap: 11 (ref 5–15)
BUN: 15 mg/dL (ref 6–20)
CO2: 22 mmol/L (ref 22–32)
Calcium: 9.1 mg/dL (ref 8.9–10.3)
Chloride: 102 mmol/L (ref 98–111)
Creatinine, Ser: 1.02 mg/dL (ref 0.61–1.24)
GFR calc Af Amer: >60 mL/min (ref >60)
GFR calc non Af Amer: >60 mL/min (ref >60)
Glucose, Bld: 183 mg/dL — ABNORMAL HIGH (ref 70–99)
Potassium: 4.1 mmol/L (ref 3.5–5.1)
Sodium: 135 mmol/L (ref 135–145)

## 2019-04-02 LAB — AEROBIC/ANAEROBIC CULTURE W GRAM STAIN (SURGICAL/DEEP WOUND): Culture: NO GROWTH

## 2019-04-02 LAB — SEDIMENTATION RATE: Sed Rate: 8 mm/hr (ref 0–16)

## 2019-04-03 ENCOUNTER — Ambulatory Visit: Payer: 59 | Admitting: *Deleted

## 2019-04-05 LAB — AEROBIC/ANAEROBIC CULTURE W GRAM STAIN (SURGICAL/DEEP WOUND): Culture: NO GROWTH

## 2019-04-06 DIAGNOSIS — A4901 Methicillin susceptible Staphylococcus aureus infection, unspecified site: Secondary | ICD-10-CM | POA: Diagnosis not present

## 2019-04-10 DIAGNOSIS — A4901 Methicillin susceptible Staphylococcus aureus infection, unspecified site: Secondary | ICD-10-CM | POA: Diagnosis not present

## 2019-04-11 MED FILL — TALTZ 80 MG/ML AUTOINJECTOR: 80 | 30 days supply | Qty: 1 | Fill #2

## 2019-04-12 DIAGNOSIS — T8453XD Infection and inflammatory reaction due to internal right knee prosthesis, subsequent encounter: Secondary | ICD-10-CM | POA: Diagnosis not present

## 2019-04-12 DIAGNOSIS — Z96651 Presence of right artificial knee joint: Secondary | ICD-10-CM | POA: Diagnosis not present

## 2019-04-12 DIAGNOSIS — Z471 Aftercare following joint replacement surgery: Secondary | ICD-10-CM | POA: Diagnosis not present

## 2019-04-12 MED FILL — METHOCARBAMOL 500 MG TABS: 500 | 15 days supply | Qty: 60 | Fill #0

## 2019-04-17 ENCOUNTER — Other Ambulatory Visit: Payer: Self-pay | Admitting: Family Medicine

## 2019-04-17 DIAGNOSIS — E1121 Type 2 diabetes mellitus with diabetic nephropathy: Secondary | ICD-10-CM

## 2019-04-17 DIAGNOSIS — A4901 Methicillin susceptible Staphylococcus aureus infection, unspecified site: Secondary | ICD-10-CM | POA: Diagnosis not present

## 2019-04-17 MED FILL — JARDIANCE 10 MG TABLET: 10 | 30 days supply | Qty: 30 | Fill #0

## 2019-04-20 DIAGNOSIS — Z760 Encounter for issue of repeat prescription: Secondary | ICD-10-CM | POA: Diagnosis not present

## 2019-04-20 DIAGNOSIS — T8459XA Infection and inflammatory reaction due to other internal joint prosthesis, initial encounter: Secondary | ICD-10-CM | POA: Diagnosis not present

## 2019-04-20 DIAGNOSIS — A4901 Methicillin susceptible Staphylococcus aureus infection, unspecified site: Secondary | ICD-10-CM | POA: Diagnosis not present

## 2019-04-24 DIAGNOSIS — T8459XA Infection and inflammatory reaction due to other internal joint prosthesis, initial encounter: Secondary | ICD-10-CM | POA: Diagnosis not present

## 2019-04-24 DIAGNOSIS — A4901 Methicillin susceptible Staphylococcus aureus infection, unspecified site: Secondary | ICD-10-CM | POA: Diagnosis not present

## 2019-05-01 DIAGNOSIS — T8459XA Infection and inflammatory reaction due to other internal joint prosthesis, initial encounter: Secondary | ICD-10-CM | POA: Diagnosis not present

## 2019-05-01 DIAGNOSIS — A4901 Methicillin susceptible Staphylococcus aureus infection, unspecified site: Secondary | ICD-10-CM | POA: Diagnosis not present

## 2019-05-03 ENCOUNTER — Encounter: Payer: Self-pay | Admitting: Internal Medicine

## 2019-05-03 ENCOUNTER — Other Ambulatory Visit: Payer: Self-pay

## 2019-05-03 ENCOUNTER — Telehealth: Payer: Self-pay

## 2019-05-03 ENCOUNTER — Ambulatory Visit: Payer: 59 | Admitting: Internal Medicine

## 2019-05-03 DIAGNOSIS — T8459XD Infection and inflammatory reaction due to other internal joint prosthesis, subsequent encounter: Secondary | ICD-10-CM

## 2019-05-03 DIAGNOSIS — A4901 Methicillin susceptible Staphylococcus aureus infection, unspecified site: Secondary | ICD-10-CM | POA: Diagnosis not present

## 2019-05-03 DIAGNOSIS — Z96659 Presence of unspecified artificial knee joint: Secondary | ICD-10-CM

## 2019-05-03 NOTE — Assessment & Plan Note (Signed)
[name] is doing well following excision arthroplasty and 5 weeks of antibiotic therapy for MSSA prosthetic joint infection.  [name] will complete cefazolin and have his pick removed on 05/09/2019.  If [name] continues to do well without signs of active infection it would be reasonable to have redo arthroplasty about 4 weeks after completing antibiotic therapy.  [name] will follow-up here in about 6 weeks.

## 2019-05-03 NOTE — Progress Notes (Signed)
[place] for Infectious Disease  Patient Active Problem List   Diagnosis Date Noted  . 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 Infected right TKA 09/10/2018    Priority: High  . S/P TKR (total knee replacement), right 05/31/2018    Priority: High  . 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 High risk medication use 11/08/2018  . S/P left TKA 06/28/2018  . Tubular adenoma of colon 10/18/2017  . 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 Diabetic nephropathy associated with type 2 diabetes mellitus ([place]) 10/13/2016  . Permanent atrial fibrillation 10/13/2016  . Health examination of defined subpopulation 11/15/2015  . Polycythemia vera ([place]) 10/09/2014  . Hyperlipidemia associated with type 2 diabetes mellitus ([place]) 09/17/2014  . Hypertension associated with diabetes ([place]) 09/17/2014  . Diabetes mellitus ([place]) 10/31/2013  . Hx-TIA (transient ischemic attack) 09/05/2012  . GERD (gastroesophageal reflux disease) 08/17/2011  . Obesity (BMI 30-39.9) 08/17/2011  . Psoriatic arthritis ([place]) 08/17/2011  . Obstructive sleep apnea 08/05/2009    Patient's Medications  New Prescriptions   No medications on file  Previous Medications   ACCU-CHEK FASTCLIX LANCETS MISC    CHECK BLOOD SUGAR TWICE A DAY   ACETAMINOPHEN (TYLENOL) 500 MG TABLET    Take 2 tablets (1,000 mg total) by mouth every 8 (eight) hours.   CARVEDILOL (COREG) 12.5 MG TABLET    Take 12.5 mg by mouth 2 (two) times daily with a meal.   CEFAZOLIN (ANCEF) IVPB    Inject 2 g into the vein every 8 (eight) hours. Indication:  Prosthetic joint infection Last Day of Therapy:  05/09/19 Labs - Once weekly:  CBC/D and BMP, Labs - Every other week:  ESR and CRP   DOCUSATE SODIUM (COLACE) 100 MG  CAPSULE    Take 1 capsule (100 mg total) by mouth 2 (two) times daily.   FERROUS SULFATE (FERROUSUL) 325 (65 FE) MG TABLET    Take 1 tablet (325 mg total) by mouth 3 (three) times daily with meals for 14 days.   GLUCOSE BLOOD (ACCU-CHEK GUIDE) TEST STRIP    USE TO TEST BLOOD GLUCOSE TWICE DAILY   IXEKIZUMAB (TALTZ) 80 MG/ML SOAJ    Inject 80 mg into the skin every 30 (thirty) days.   JARDIANCE 10 MG TABS TABLET    TAKE 1 TABLET BY MOUTH ONCE DAILY   METHOCARBAMOL (ROBAXIN) 500 MG TABLET    Take 1 tablet (500 mg total) by mouth every 6 (six) hours as needed for muscle spasms.   MULTIPLE VITAMIN (MULTIVITAMIN WITH MINERALS) TABS TABLET    Take 1 tablet by mouth daily.   OXYCODONE (OXY IR/ROXICODONE) 5 MG IMMEDIATE RELEASE TABLET    Take 1-2 tablets (5-10 mg total) by mouth every 4 (four) hours as needed for moderate pain or severe pain.   OXYMETAZOLINE (AFRIN) 0.05 % NASAL SPRAY    Place 1 spray into both nostrils at bedtime as needed for congestion.    PANTOPRAZOLE (PROTONIX) 40 MG TABLET    Take 1 tablet (40 mg total) by mouth 2 (two) times daily.   POLYETHYLENE GLYCOL (MIRALAX / GLYCOLAX) 17 G PACKET    Take 17 g by mouth 2 (two) times daily.   POLYVINYL ALCOHOL (LIQUIFILM TEARS) 1.4 % OPHTHALMIC SOLUTION    Place 1  drop into both eyes as needed for dry eyes.   RIVAROXABAN (XARELTO) 20 MG TABS TABLET    Take 20 mg by mouth daily after supper.   ROSUVASTATIN (CRESTOR) 10 MG TABLET    Take 1 tablet (10 mg total) by mouth daily.   SACUBITRIL-VALSARTAN (ENTRESTO) 24-26 MG    Take 1 tablet by mouth 2 (two) times daily.   VITAMIN D, CHOLECALCIFEROL, 1000 UNITS CAPS    Take 1,000 Units by mouth daily.   Modified Medications   No medications on file  Discontinued Medications   No medications on file    Subjective: [name] is in with his wife, [name], for his routine follow-up visit.  He had a early relapse of his MSSA right prosthetic knee infection and underwent excision arthroplasty on 03/28/2019.  He was  discharged on IV cefazolin and has now completed 5 weeks of treatment.  He has had no problems tolerating his antibiotic or PICC.  He is feeling better.  He is working part-time.  He still has some warmth and swelling in his right knee but he is not having much pain.  Review of Systems: Review of Systems  Constitutional: Negative for fever.  Gastrointestinal: Negative for abdominal pain, diarrhea, nausea and vomiting.  Musculoskeletal: Positive for joint pain.    Past Medical History:  Diagnosis Date  . Arthritis   . Diabetes mellitus without complication ([place])    TYPE 2  . Diverticulosis   . Dyslipidemia   . GERD (gastroesophageal reflux disease)   . Herpes labialis   . Hypertension   . Longstanding persistent atrial fibrillation   . Metabolic syndrome   . Obesity   . Proteinuria   . Psoriasis   . Seborrheic dermatitis   . Sleep apnea    very compliant with CPAP, PT NEEDS TO BRING OWN MACHINE  . TIA (transient ischemic attack) 2009    Social History   Tobacco Use  . Smoking status: Never Smoker  . Smokeless tobacco: Never Used  Substance Use Topics  . Alcohol use: Yes    Comment: 2/month  . Drug use: No    Family History  Problem Relation Age of Onset  . Uterine cancer Mother 101       Cervical cath  . Crohn's disease Mother   . Esophageal cancer Father   . Cancer Other   . Obesity Other     Allergies  Allergen Reactions  . Morphine And Related Other (See Comments)    Hallucinations    Objective: Vitals:   05/03/19 0942  BP: 134/88  Pulse: 97  Temp: 97.6 F (36.4 C)   There is no height or weight on file to calculate BMI.  Physical Exam Constitutional:      Comments: He is talkative and in good spirits.  Musculoskeletal:     Comments: His right knee incision is healed nicely.  He has some diffuse swelling and warmth but no erythema.  Skin:    Comments: Right arm PICC site looks good.     Lab Results   His sed rate and C-reactive protein  have been normal each week for the last 3 weeks  Sed Rate (mm/hr)  Date Value  04/02/2019 8  03/23/2019 5  09/05/2018 9   CRP  Date Value  04/02/2019 1.5 mg/dL (H)  03/23/2019 152 mg/L (H)  09/05/2018 53 mg/L (H)  Okay 4 weeks   Problem List Items Addressed This Visit      High   Infected right  TKA    [name] is doing well following excision arthroplasty and 5 weeks of antibiotic therapy for MSSA prosthetic joint infection.  He will complete cefazolin and have his pick removed on 05/09/2019.  If he continues to do well without signs of active infection it would be reasonable to have redo arthroplasty about 4 weeks after completing antibiotic therapy.  He will follow-up here in about 6 weeks.          Michel Bickers, MD [facility] [place] [facility] [phone] pager   [phone] cell 05/03/2019, 10:46 AM

## 2019-05-03 NOTE — Telephone Encounter (Signed)
[facility] per Dr. Megan Salon to relay new orders to haveJames PICC line pulled on 05/09/2019.   Spoke to [place] at [facility], she demonstrated understanding with feedback.   [name] is aware of new plan of care, verbally informed on today's visit 05/03/2019, informed him about CMA calling [facility] to relay orders.     [name] agreed to care, and has no questions or concerns today.   Lenore Cordia, [place]

## 2019-05-03 NOTE — Telephone Encounter (Signed)
Thank you :)

## 2019-05-09 DIAGNOSIS — A4901 Methicillin susceptible Staphylococcus aureus infection, unspecified site: Secondary | ICD-10-CM | POA: Diagnosis not present

## 2019-05-12 ENCOUNTER — Other Ambulatory Visit: Payer: Self-pay | Admitting: Internal Medicine

## 2019-05-12 DIAGNOSIS — Z471 Aftercare following joint replacement surgery: Secondary | ICD-10-CM | POA: Diagnosis not present

## 2019-05-12 DIAGNOSIS — Z96651 Presence of right artificial knee joint: Secondary | ICD-10-CM | POA: Diagnosis not present

## 2019-05-12 DIAGNOSIS — T8579XD Infection and inflammatory reaction due to other internal prosthetic devices, implants and grafts, subsequent encounter: Secondary | ICD-10-CM | POA: Diagnosis not present

## 2019-05-12 MED FILL — ENTRESTO 24 MG-26 MG TABLET: 24-26 | 30 days supply | Qty: 60 | Fill #1

## 2019-05-12 MED FILL — JARDIANCE 10 MG TABLET: 10 | 30 days supply | Qty: 30 | Fill #1

## 2019-05-12 MED FILL — METHOCARBAMOL 500 MG TABS: 500 | 15 days supply | Qty: 60 | Fill #1

## 2019-05-12 MED FILL — XARELTO 20 MG TABLET: 20 | 90 days supply | Qty: 90 | Fill #0

## 2019-05-12 NOTE — Telephone Encounter (Signed)
Prescription refill request for Xarelto received.   Last office visit: [name] (10-24-2018) Weight: 119 kg Age: 58 y.o. Scr: 1.02 (04-02-2019) CrCl: 131 ml/min  Prescription refill sent.

## 2019-05-15 MED FILL — CARVEDILOL 25 MG TABLET: 25 | 90 days supply | Qty: 180 | Fill #3

## 2019-05-23 IMAGING — DX DG CHEST 1V PORT
1 series · 1 of 1 positions shown · non-contrast
Comparison: 11/24/2016

CLINICAL DATA: PICC line placement

EXAM:
PORTABLE CHEST 1 VIEW

[chest ap]
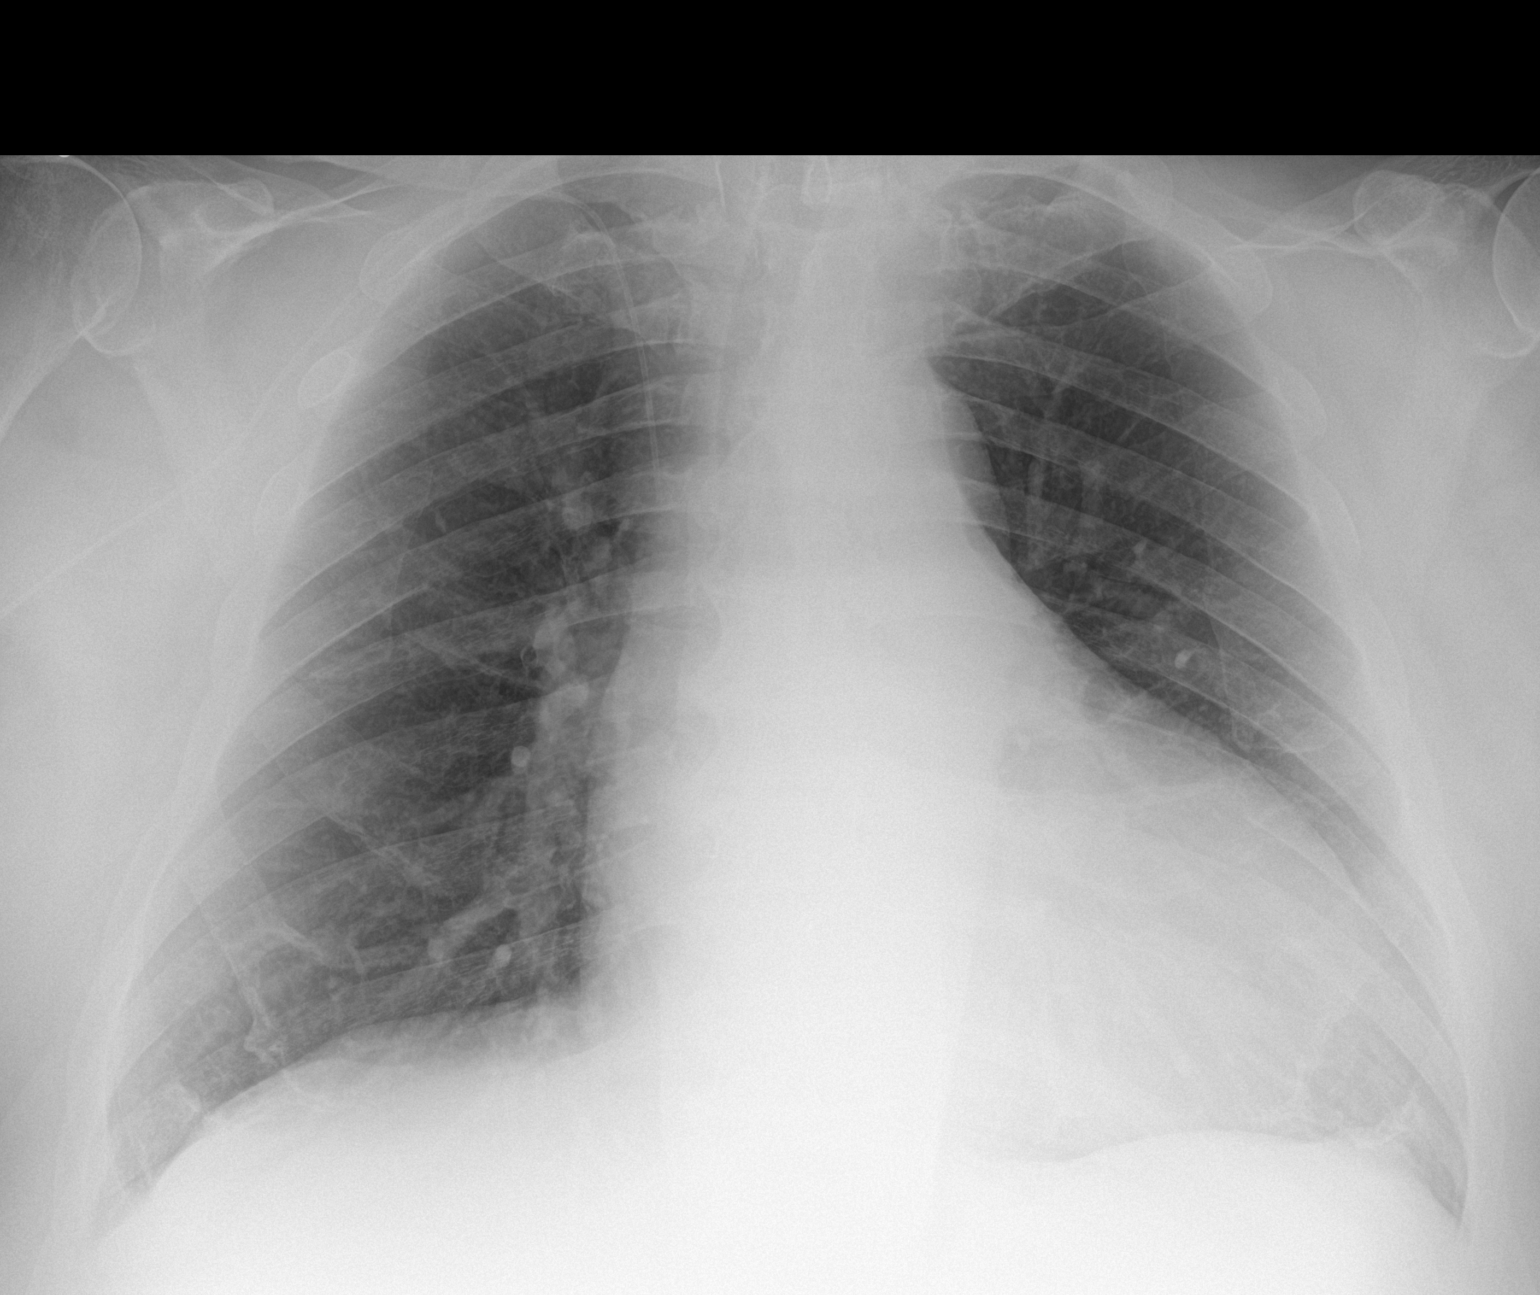

[1 of 1 positions shown; findings below may reference images not displayed]

FINDINGS: 9802 hours. The cardio pericardial silhouette is enlarged. The lungs
are clear without focal pneumonia, edema, pneumothorax or pleural
effusion. Right PICC line tip overlies the proximal SVC level. The
visualized bony structures of the thorax are intact.
IMPRESSION: Right PICC line tip overlies the proximal SVC.

## 2019-05-24 ENCOUNTER — Telehealth (INDEPENDENT_AMBULATORY_CARE_PROVIDER_SITE_OTHER): Payer: 59 | Admitting: Internal Medicine

## 2019-05-24 ENCOUNTER — Encounter: Payer: Self-pay | Admitting: Internal Medicine

## 2019-05-24 VITALS — Ht 70.0 in | Wt 250.0 lb

## 2019-05-24 DIAGNOSIS — G4733 Obstructive sleep apnea (adult) (pediatric): Secondary | ICD-10-CM | POA: Diagnosis not present

## 2019-05-24 DIAGNOSIS — I4819 Other persistent atrial fibrillation: Secondary | ICD-10-CM

## 2019-05-24 DIAGNOSIS — I428 Other cardiomyopathies: Secondary | ICD-10-CM | POA: Diagnosis not present

## 2019-05-24 NOTE — Progress Notes (Signed)
Electrophysiology TeleHealth Note   Due to national recommendations of social distancing due to COVID 19, an audio/video telehealth visit is felt to be most appropriate for this patient at this time.  See MyChart message from today for the patient's consent to telehealth for [name].  Date:  05/24/2019   ID:  [name], DOB [birth year], MRN [id number]  Location: patient's home  Provider location:  [place]  Evaluation Performed: Follow-up visit  PCP:  Denita Lung, MD   Electrophysiologist:  Dr Rayann Heman  Chief Complaint:  palpitations  History of Present Illness:    [name]. is a 58 y.o. male who presents via telehealth conferencing today.  Since last being seen in our clinic, the patient reports doing reasonably well.  No real cardiac issues.  He has struggled with DJD, knee surgery, and infections.  He has been on antibiotics a fair bit over the past year.  He is scheduled for R knee surgery 06/15/2019.  He is unaware of his afib.  Today, he denies symptoms of palpitations, chest pain, shortness of breath,  lower extremity edema, dizziness, presyncope, or syncope.  The patient is otherwise without complaint today.  The patient denies symptoms of fevers, chills, cough, or new SOB worrisome for COVID 19.  Past Medical History:  Diagnosis Date  . Arthritis   . Diabetes mellitus without complication ([place])    TYPE 2  . Diverticulosis   . Dyslipidemia   . GERD (gastroesophageal reflux disease)   . Herpes labialis   . Hypertension   . Longstanding persistent atrial fibrillation   . Metabolic syndrome   . Obesity   . Proteinuria   . Psoriasis   . Seborrheic dermatitis   . Sleep apnea    very compliant with CPAP, PT NEEDS TO BRING OWN MACHINE  . TIA (transient ischemic attack) 2009    Past Surgical History:  Procedure Laterality Date  . CARDIOVERSION N/A 12/02/2016   Procedure: CARDIOVERSION;  Surgeon: Pixie Casino, MD;  Location: [facility]  ENDOSCOPY;  Service: Cardiovascular;  Laterality: N/A;  . COLONOSCOPY    . EXCISIONAL TOTAL KNEE ARTHROPLASTY WITH ANTIBIOTIC SPACERS Right 03/28/2019   Procedure: EXCISIONAL TOTAL KNEE ARTHROPLASTY WITH ANTIBIOTIC SPACERS;  Surgeon: [name], MD;  Location: [facility] ORS;  Service: Orthopedics;  Laterality: Right;  90 mins  . I&D KNEE WITH POLY EXCHANGE Right 09/10/2018   Procedure: Right Knee Arthroplasty IRRIGATION AND DEBRIDEMENT KNEE WITH POLY EXCHANGE;  Surgeon: [name], MD;  Location: [facility] ORS;  Service: Orthopedics;  Laterality: Right;  . JOINT REPLACEMENT  11/03/10   LT HIP  . PFO occluder cardiac valve  2006   Dr. Einar Gip, (hole in heart)  . TONSILLECTOMY AND ADENOIDECTOMY    . TOTAL HIP ARTHROPLASTY Left   . TOTAL KNEE ARTHROPLASTY Right 05/31/2018   Procedure: RIGHT TOTAL KNEE ARTHROPLASTY;  Surgeon: [name], MD;  Location: [facility]S;  Service: Orthopedics;  Laterality: Right;  70 mins  . TOTAL KNEE ARTHROPLASTY Left 06/28/2018   Procedure: LEFT TOTAL KNEE ARTHROPLASTY;  Surgeon: [name], MD;  Location: [facility]S;  Service: Orthopedics;  Laterality: Left;  70 mins  . WISDOM TOOTH EXTRACTION      Current Outpatient Medications  Medication Sig Dispense Refill  . Accu-Chek FastClix Lancets MISC CHECK BLOOD SUGAR TWICE A DAY 102 each 4  . carvedilol (COREG) 12.5 MG tablet Take 12.5 mg by mouth 2 (two) times daily with a meal.    .  glucose blood (ACCU-CHEK GUIDE) test strip USE TO TEST BLOOD GLUCOSE TWICE DAILY 100 each 2  . Ixekizumab (TALTZ) 80 MG/ML SOAJ Inject 80 mg into the skin every 30 (thirty) days. 1 mL 11  . JARDIANCE 10 MG TABS tablet TAKE 1 TABLET BY MOUTH ONCE DAILY 30 tablet 5  . methocarbamol (ROBAXIN) 500 MG tablet Take 1 tablet (500 mg total) by mouth every 6 (six) hours as needed for muscle spasms. 40 tablet 0  . Multiple Vitamin (MULTIVITAMIN WITH MINERALS) TABS tablet Take 1 tablet by mouth daily.    Marland Kitchen oxymetazoline (AFRIN) 0.05 % nasal spray Place 1 spray into  both nostrils at bedtime as needed for congestion.     . pantoprazole (PROTONIX) 40 MG tablet Take 1 tablet (40 mg total) by mouth 2 (two) times daily. (Patient taking differently: Take 40 mg by mouth daily. ) 270 tablet 3  . rosuvastatin (CRESTOR) 10 MG tablet Take 1 tablet (10 mg total) by mouth daily. 90 tablet 3  . sacubitril-valsartan (ENTRESTO) 24-26 MG Take 1 tablet by mouth 2 (two) times daily. 60 tablet 11  . Vitamin D, Cholecalciferol, 1000 units CAPS Take 1,000 Units by mouth daily.     Alveda Reasons 20 MG TABS tablet TAKE 1 TABLET BY MOUTH DAILY WITH SUPPER. 90 tablet 1   No current facility-administered medications for this visit.     Allergies:   Morphine and related   Social History:  The patient  reports that he has never smoked. He has never used smokeless tobacco. He reports current alcohol use. He reports that he does not use drugs.   Family History:  The patient's family history includes Cancer in an other family member; Crohn's disease in his mother; Esophageal cancer in his father; Obesity in an other family member; Uterine cancer (age of onset: 35) in his mother.   ROS:  Please see the history of present illness.   All other systems are personally reviewed and negative.    Exam:    Vital Signs:  Ht 5\' 10"  (1.778 m)   Wt 250 lb (113.4 kg)   BMI 35.87 kg/m   Well sounding and appearing, alert and conversant, regular work of breathing,  good skin color Eyes- anicteric, neuro- grossly intact, skin- no apparent rash or lesions or cyanosis, mouth- oral mucosa is pink  Labs/Other Tests and Data Reviewed:    Recent Labs: 03/23/2019: ALT 43 04/02/2019: BUN 15; Creatinine, Ser 1.02; Hemoglobin 15.7; Platelets 283; Potassium 4.1; Sodium 135   Wt Readings from Last 3 Encounters:  05/24/19 250 lb (113.4 kg)  03/28/19 264 lb 1.8 oz (119.8 kg)  03/24/19 264 lb 2 oz (119.8 kg)     ASSESSMENT & PLAN:    1.  Longstanding persistent atrial fibrillation Minimal symptoms Doing  well with rate control On xarelto  2. nonischemiic CM EF 35-40% Continue current medical therapy  3. OSA compliance with CPAP encouraged  4. Overweight lifestyle modification is encouraged He is working on this  5. HTN Stable No change required today  6. preop Ok to proceed to knee surgery without further EP intervention Hold xarelto 24 hours prior to surgery and resume when able afterwards   Follow-up:  Return to see EP PA in a year Follow-up with Dr Acie Fredrickson as scheduled   Patient Risk:  after full review of this patients clinical status, I feel that they are at moderate risk at this time.  Today, I have spent 15 minutes with the patient with  telehealth technology discussing arrhythmia management .    Army Fossa, MD  05/24/2019 9:11 AM     [facility] [address] [place] [address] [phone] (office) [phone] (fax)

## 2019-05-25 ENCOUNTER — Other Ambulatory Visit (INDEPENDENT_AMBULATORY_CARE_PROVIDER_SITE_OTHER): Payer: 59

## 2019-05-25 ENCOUNTER — Other Ambulatory Visit (HOSPITAL_COMMUNITY): Payer: Self-pay | Admitting: Orthopedic Surgery

## 2019-05-25 ENCOUNTER — Other Ambulatory Visit: Payer: Self-pay

## 2019-05-25 DIAGNOSIS — M79604 Pain in right leg: Secondary | ICD-10-CM

## 2019-05-25 DIAGNOSIS — Z23 Encounter for immunization: Secondary | ICD-10-CM | POA: Diagnosis not present

## 2019-05-25 DIAGNOSIS — M79661 Pain in right lower leg: Secondary | ICD-10-CM

## 2019-05-25 DIAGNOSIS — T8453XD Infection and inflammatory reaction due to internal right knee prosthesis, subsequent encounter: Secondary | ICD-10-CM

## 2019-05-25 NOTE — Progress Notes (Unsigned)
[name] ok'ed this lab orders to be done here

## 2019-05-25 NOTE — H&P (Signed)
William Barges. is an 58 y.o. male.    Chief Complaint: S/P right knee resection due to infection  Procedure:   Right total knee reimplantation / revision  HPI: Pt is a 58 y.o. male  s/p right knee resection due to infection and feels that he is doing well. Has been on antibiotics for 6 weeks and stopped them recently.  Recent labs have been trending in a good direction.  Patient is anxitious to get a real TKA in place.  Various options are discussed with the patient. Risks, benefits and expectations were discussed with the patient. Patient understand the risks, benefits and expectations and wishes to proceed with surgery.    PCP: Denita Lung, MD  D/C Plans:       Home  Post-op Meds:       No Rx given   Tranexamic Acid:      To be given - IV   Decadron:      Is to be given  FYI:      ASA  Norco  CPAP  DME:   Pt already has equipment  PT:   OPPT    PMH: Past Medical History:  Diagnosis Date  . Arthritis   . Diabetes mellitus without complication ([place])    TYPE 2  . Diverticulosis   . Dyslipidemia   . GERD (gastroesophageal reflux disease)   . Herpes labialis   . Hypertension   . Longstanding persistent atrial fibrillation   . Metabolic syndrome   . Obesity   . Proteinuria   . Psoriasis   . Seborrheic dermatitis   . Sleep apnea    very compliant with CPAP, PT NEEDS TO BRING OWN MACHINE  . TIA (transient ischemic attack) 2009    PSH: Past Surgical History:  Procedure Laterality Date  . CARDIOVERSION N/A 12/02/2016   Procedure: CARDIOVERSION;  Surgeon: Pixie Casino, MD;  Location: [facility] ENDOSCOPY;  Service: Cardiovascular;  Laterality: N/A;  . COLONOSCOPY    . EXCISIONAL TOTAL KNEE ARTHROPLASTY WITH ANTIBIOTIC SPACERS Right 03/28/2019   Procedure: EXCISIONAL TOTAL KNEE ARTHROPLASTY WITH ANTIBIOTIC SPACERS;  Surgeon: [name], MD;  [facility];  Service: Orthopedics;  Laterality: Right;  90 mins  . I&D KNEE WITH POLY EXCHANGE Right 09/10/2018   Procedure: Right Knee Arthroplasty IRRIGATION AND DEBRIDEMENT KNEE WITH POLY EXCHANGE;  Surgeon: [name], MD;  Location: [facility];  Service: Orthopedics;  Laterality: Right;  . JOINT REPLACEMENT  11/03/10   LT HIP  . PFO occluder cardiac valve  2006   Dr. Einar Gip, (hole in heart)  . TONSILLECTOMY AND ADENOIDECTOMY    . TOTAL HIP ARTHROPLASTY Left   . TOTAL KNEE ARTHROPLASTY Right 05/31/2018   Procedure: RIGHT TOTAL KNEE ARTHROPLASTY;  Surgeon: [name], MD;  [facility]: [facility];  Service: Orthopedics;  Laterality: Right;  70 mins  . TOTAL KNEE ARTHROPLASTY Left 06/28/2018   Procedure: LEFT TOTAL KNEE ARTHROPLASTY;  Surgeon: [name], MD;  Location: [facility];  Service: Orthopedics;  Laterality: Left;  70 mins  . WISDOM TOOTH EXTRACTION      Social History:  reports that he has never smoked. He has never used smokeless tobacco. He reports current alcohol use. He reports that he does not use drugs.  Allergies:  Allergies  Allergen Reactions  . Morphine And Related Other (See Comments)    Hallucinations    Medications: No current facility-administered medications for this encounter.    Current Outpatient Medications  Medication Sig Dispense  Refill  . Accu-Chek FastClix Lancets MISC CHECK BLOOD SUGAR TWICE A DAY 102 each 4  . carvedilol (COREG) 12.5 MG tablet Take 12.5 mg by mouth 2 (two) times daily with a meal.    . glucose blood (ACCU-CHEK GUIDE) test strip USE TO TEST BLOOD GLUCOSE TWICE DAILY 100 each 2  . Ixekizumab (TALTZ) 80 MG/ML SOAJ Inject 80 mg into the skin every 30 (thirty) days. 1 mL 11  . JARDIANCE 10 MG TABS tablet TAKE 1 TABLET BY MOUTH ONCE DAILY 30 tablet 5  . methocarbamol (ROBAXIN) 500 MG tablet Take 1 tablet (500 mg total) by mouth every 6 (six) hours as needed for muscle spasms. 40 tablet 0  . Multiple Vitamin (MULTIVITAMIN WITH MINERALS) TABS tablet Take 1 tablet by mouth daily.    Marland Kitchen oxymetazoline (AFRIN) 0.05 % nasal spray Place 1 spray into both nostrils  at bedtime as needed for congestion.     . pantoprazole (PROTONIX) 40 MG tablet Take 1 tablet (40 mg total) by mouth 2 (two) times daily. (Patient taking differently: Take 40 mg by mouth daily. ) 270 tablet 3  . rosuvastatin (CRESTOR) 10 MG tablet Take 1 tablet (10 mg total) by mouth daily. 90 tablet 3  . sacubitril-valsartan (ENTRESTO) 24-26 MG Take 1 tablet by mouth 2 (two) times daily. 60 tablet 11  . Vitamin D, Cholecalciferol, 1000 units CAPS Take 1,000 Units by mouth daily.     Alveda Reasons 20 MG TABS tablet TAKE 1 TABLET BY MOUTH DAILY WITH SUPPER. 90 tablet 1       Review of Systems  Constitutional: Negative.   HENT: Negative.   Eyes: Negative.   Respiratory: Negative.   Cardiovascular: Negative.   Gastrointestinal: Positive for heartburn.  Genitourinary: Negative.   Musculoskeletal: Positive for joint pain.  Skin: Negative.   Neurological: Negative.   Endo/Heme/Allergies: Negative.   Psychiatric/Behavioral: Negative.        Physical Exam  Constitutional: He is oriented to person, place, and time. He appears well-developed.  HENT:  Head: Normocephalic.  Eyes: Pupils are equal, round, and reactive to light.  Neck: Neck supple. No JVD present. No tracheal deviation present. No thyromegaly present.  Cardiovascular: Normal rate, regular rhythm and intact distal pulses.  Respiratory: Effort normal and breath sounds normal. No respiratory distress. He has no wheezes.  GI: Soft. There is no abdominal tenderness. There is no guarding.  Musculoskeletal:     Right knee: He exhibits decreased range of motion, swelling and bony tenderness. He exhibits no ecchymosis, no deformity, no laceration (healed previous incision) and no erythema. Tenderness found.  Lymphadenopathy:    He has no cervical adenopathy.  Neurological: He is alert and oriented to person, place, and time.  Skin: Skin is warm and dry.  Psychiatric: He has a normal mood and affect.       Assessment/Plan  Assessment: S/P right knee resection due to infection   Plan: Patient will undergo a right total knee reimplantation / revision on 06/15/2019 per Dr. Alvan Dame at [facility]. Risks benefits and expectations were discussed with the patient. Patient understand risks, benefits and expectations and wishes to proceed.   William Pugh Leovardo Thoman   PA-C  05/25/2019, 4:20 PM

## 2019-05-26 ENCOUNTER — Telehealth: Payer: Self-pay | Admitting: Internal Medicine

## 2019-05-26 ENCOUNTER — Telehealth: Payer: Self-pay

## 2019-05-26 ENCOUNTER — Ambulatory Visit (HOSPITAL_COMMUNITY)
Admission: RE | Admit: 2019-05-26 | Discharge: 2019-05-26 | Disposition: A | Discharge status: Home / Self Care | Payer: 59 | Source: Ambulatory Visit | Attending: Cardiology | Admitting: Cardiology

## 2019-05-26 DIAGNOSIS — M79604 Pain in right leg: Secondary | ICD-10-CM | POA: Diagnosis not present

## 2019-05-26 DIAGNOSIS — M79661 Pain in right lower leg: Secondary | ICD-10-CM | POA: Insufficient documentation

## 2019-05-26 LAB — HIGH SENSITIVITY CRP: CRP, High Sensitivity: 19.63 mg/L — ABNORMAL HIGH (ref 0.00–3.00)

## 2019-05-26 LAB — SEDIMENTATION RATE: Sed Rate: 36 mm/hr — ABNORMAL HIGH (ref 0–30)

## 2019-05-26 NOTE — Telephone Encounter (Signed)
Sent in lab result to nDr. Alvan Dame office . [place]

## 2019-05-26 NOTE — Telephone Encounter (Signed)
I spoke to [name] today.  He completed 6 weeks of IV cefazolin on 05/09/2019 for relapsed MSSA prosthetic joint infection.  He had undergone resection arthroplasty and spacer placement.  He says that he has not had any new swelling or increased warmth of his knee but he does say that it is more painful over the last few weeks.  He is having to use a cane to assist with walking.  He saw his surgeon, Dr. Alvan Dame, yesterday who felt that arthrocentesis was unnecessary.  He was sent for blood work which showed an increase in his sed rate and C-reactive protein.  The change in his blood work combined with his increased pain certainly raises concern for persistent infection.  He is currently scheduled for redo arthroplasty on 06/15/2019.  I have scheduled him to come in and see me on 06/01/2019 for examination and repeat blood work.   05/25/2019 high-sensitivity CRP elevated at 19.63 Sed Rate (mm/hr)  Date Value  05/25/2019 36 (H)  04/02/2019 8  03/23/2019 5   CRP  Date Value  04/02/2019 1.5 mg/dL (H)  03/23/2019 152 mg/L (H)  09/05/2018 53 mg/L (H)

## 2019-05-29 ENCOUNTER — Encounter: Payer: Self-pay | Admitting: Family Medicine

## 2019-05-30 DIAGNOSIS — Z79899 Other long term (current) drug therapy: Secondary | ICD-10-CM | POA: Diagnosis not present

## 2019-05-30 DIAGNOSIS — L720 Epidermal cyst: Secondary | ICD-10-CM | POA: Diagnosis not present

## 2019-05-30 DIAGNOSIS — L400 Psoriasis vulgaris: Secondary | ICD-10-CM | POA: Diagnosis not present

## 2019-05-30 DIAGNOSIS — D225 Melanocytic nevi of trunk: Secondary | ICD-10-CM | POA: Diagnosis not present

## 2019-05-30 DIAGNOSIS — L821 Other seborrheic keratosis: Secondary | ICD-10-CM | POA: Diagnosis not present

## 2019-05-30 DIAGNOSIS — Z85828 Personal history of other malignant neoplasm of skin: Secondary | ICD-10-CM | POA: Diagnosis not present

## 2019-06-01 ENCOUNTER — Encounter: Payer: Self-pay | Admitting: Internal Medicine

## 2019-06-01 ENCOUNTER — Other Ambulatory Visit: Payer: Self-pay

## 2019-06-01 ENCOUNTER — Ambulatory Visit: Payer: 59 | Admitting: Internal Medicine

## 2019-06-01 DIAGNOSIS — Z96659 Presence of unspecified artificial knee joint: Secondary | ICD-10-CM | POA: Diagnosis not present

## 2019-06-01 DIAGNOSIS — T8459XD Infection and inflammatory reaction due to other internal joint prosthesis, subsequent encounter: Secondary | ICD-10-CM | POA: Diagnosis not present

## 2019-06-01 MED FILL — TALTZ 80 MG/ML AUTOINJECTOR: 80 | 30 days supply | Qty: 1 | Fill #3

## 2019-06-01 NOTE — Progress Notes (Addendum)
[name] for Infectious Disease  Patient Active Problem List   Diagnosis Date Noted  . 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 Infected right TKA 09/10/2018    Priority: High  . S/P TKR (total knee replacement), right 05/31/2018    Priority: High  . 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 High risk medication use 11/08/2018  . S/P left TKA 06/28/2018  . Tubular adenoma of colon 10/18/2017  . 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 Diabetic nephropathy associated with type 2 diabetes mellitus ([place]) 10/13/2016  . Permanent atrial fibrillation ([place]) 10/13/2016  . Health examination of defined subpopulation 11/15/2015  . Polycythemia vera ([place]) 10/09/2014  . Hyperlipidemia associated with type 2 diabetes mellitus ([place]) 09/17/2014  . Hypertension associated with diabetes ([place]) 09/17/2014  . Diabetes mellitus ([place]) 10/31/2013  . Hx-TIA (transient ischemic attack) 09/05/2012  . GERD (gastroesophageal reflux disease) 08/17/2011  . Obesity (BMI 30-39.9) 08/17/2011  . Psoriatic arthritis ([place]) 08/17/2011  . Obstructive sleep apnea 08/05/2009    Patient's Medications  New Prescriptions   No medications on file  Previous Medications   ACCU-CHEK FASTCLIX LANCETS MISC    CHECK BLOOD SUGAR TWICE A DAY   CARVEDILOL (COREG) 12.5 MG TABLET    Take 12.5 mg by mouth 2 (two) times daily with a meal.   GLUCOSE BLOOD (ACCU-CHEK GUIDE) TEST STRIP    USE TO TEST BLOOD GLUCOSE TWICE DAILY   IXEKIZUMAB (TALTZ) 80 MG/ML SOAJ    Inject 80 mg into the skin every 30 (thirty) days.   JARDIANCE 10 MG TABS TABLET    TAKE 1 TABLET BY MOUTH ONCE DAILY   METHOCARBAMOL (ROBAXIN) 500 MG TABLET    Take 1 tablet (500 mg total) by mouth every 6 (six) hours as needed for  muscle spasms.   MULTIPLE VITAMIN (MULTIVITAMIN WITH MINERALS) TABS TABLET    Take 1 tablet by mouth daily.   OXYMETAZOLINE (AFRIN) 0.05 % NASAL SPRAY    Place 1 spray into both nostrils at bedtime as needed for congestion.    PANTOPRAZOLE (PROTONIX) 40 MG TABLET    Take 1 tablet (40 mg total) by mouth 2 (two) times daily.   ROSUVASTATIN (CRESTOR) 10 MG TABLET    Take 1 tablet (10 mg total) by mouth daily.   SACUBITRIL-VALSARTAN (ENTRESTO) 24-26 MG    Take 1 tablet by mouth 2 (two) times daily.   VITAMIN D, CHOLECALCIFEROL, 1000 UNITS CAPS    Take 1,000 Units by mouth daily.    XARELTO 20 MG TABS TABLET    TAKE 1 TABLET BY MOUTH DAILY WITH SUPPER.  Modified Medications   No medications on file  Discontinued Medications   No medications on file    Subjective: [name] is in with his wife, [name], for his routine follow-up visit.  He had a early relapse of his MSSA right prosthetic knee infection and underwent excision arthroplasty on 03/28/2019.  He was discharged on IV cefazolin and completed 6 weeks of treatment on 05/09/2019.  He has had no problems tolerating his antibiotic or PICC.  I spoke with him last week after he had repeat inflammatory markers that had gone back up.  At that time, it sounded like he was having more problems with pain and using his crutch more.  Both of them now tell me that his knee is about the same as it was when he stopped antibiotics 1 month ago.  He is scheduled for repeat surgery on 06/15/2019 by Dr. Adriana Mccallum a child.  [name] takes ixekizumzb [employer]) for his psoriasis.  He has been off of it for the last 2 months.  He had his annual PPD skin test placed 2 days ago.  Review of Systems: Review of Systems  Constitutional: Negative for fever.  Gastrointestinal: Negative for abdominal pain, diarrhea, nausea and vomiting.  Musculoskeletal: Positive for joint pain.    Past Medical History:  Diagnosis Date  . Arthritis   . Diabetes mellitus without complication ([place])     TYPE 2  . Diverticulosis   . Dyslipidemia   . GERD (gastroesophageal reflux disease)   . Herpes labialis   . Hypertension   . Longstanding persistent atrial fibrillation ([place])   . Metabolic syndrome   . Obesity   . Proteinuria   . Psoriasis   . Seborrheic dermatitis   . Sleep apnea    very compliant with CPAP, PT NEEDS TO BRING OWN MACHINE  . TIA (transient ischemic attack) 2009    Social History   Tobacco Use  . Smoking status: Never Smoker  . Smokeless tobacco: Never Used  Substance Use Topics  . Alcohol use: Yes    Comment: 2/month  . Drug use: No    Family History  Problem Relation Age of Onset  . Uterine cancer Mother 46       Cervical cath  . Crohn's disease Mother   . Esophageal cancer Father   . Cancer Other   . Obesity Other     Allergies  Allergen Reactions  . Morphine And Related Other (See Comments)    Hallucinations    Objective: Vitals:   06/01/19 1103  BP: 129/85  Pulse: 78  Temp: 98 F (36.7 C)  TempSrc: Oral   There is no height or weight on file to calculate BMI.  Physical Exam Constitutional:      Comments: He is talkative and in good spirits.  Musculoskeletal:     Comments: His right knee incision is healed nicely.  He has some diffuse swelling and warmth.  There is some erythema over the proximal incision erythema.  Both of them tell me that his knee looks and feels about the same.  Skin:    Comments: There is no induration at the site of his left forearm PPD.     Lab Results  Sed Rate  Date Value  06/01/2019 9 mm/h  05/25/2019 36 mm/hr (H)  04/02/2019 8 mm/hr   CRP  Date Value  06/01/2019 26.6 mg/L (H)  04/02/2019 1.5 mg/dL (H)  03/23/2019 152 mg/L (H)  09/05/2018 53 mg/L (H)   His sed rate and C-reactive protein have been normal each week for the last 3 weeks  Sed Rate  Date Value  06/01/2019 9 mm/h  05/25/2019 36 mm/hr (H)  04/02/2019 8 mm/hr   CRP  Date Value  06/01/2019 26.6 mg/L (H)  04/02/2019 1.5  mg/dL (H)  03/23/2019 152 mg/L (H)  09/05/2018 53 mg/L (H)  Okay 4 weeks   Problem List Items Addressed This Visit      High   Infected right TKA    The recent increase of his inflammatory markers makes me more concerned about the possibility of persistent infection.  I have repeated his inflammatory markers today and the results do not help answer  the question of whether or not he has persistent infection.  His sedimentation rate has gone back down to 9 but his C-reactive protein has gone up to 26.6.  I will discuss the possibility of arthrocentesis prior to his planned surgery on 06/15/2019 with Dr. Alvan Dame.  He is not having any flare of his psoriasis.  I favor having him stay off of ixekizumzb (Taltz) for the foreseeable future in order to try to maximize the chance that we can cure his knee infection and safely get him through revision arthroplasty.  He is in agreement with that plan.      Relevant Orders   C-reactive protein (Completed)   Sedimentation rate (Completed)       Michel Bickers, MD [facility] for [place] [phone] pager   [phone] cell 06/02/2019, 9:34 AM

## 2019-06-01 NOTE — Assessment & Plan Note (Addendum)
The recent increase of his inflammatory markers makes me more concerned about the possibility of persistent infection.  I have repeated his inflammatory markers today and the results do not help answer the question of whether or not he has persistent infection.  His sedimentation rate has gone back down to 9 but his C-reactive protein has gone up to 26.6.  I will discuss the possibility of arthrocentesis prior to his planned surgery on 06/15/2019 with Dr. Alvan Dame.  He is not having any flare of his psoriasis.  I favor having him stay off of ixekizumzb (Taltz) for the foreseeable future in order to try to maximize the chance that we can cure his knee infection and safely get him through revision arthroplasty.  He is in agreement with that plan.

## 2019-06-02 LAB — SEDIMENTATION RATE: Sed Rate: 9 mm/h (ref 0–20)

## 2019-06-02 LAB — C-REACTIVE PROTEIN: CRP: 26.6 mg/L — ABNORMAL HIGH (ref <8.0)

## 2019-06-06 NOTE — Patient Instructions (Addendum)
DUE TO COVID-19 ONLY ONE VISITOR IS ALLOWED TO COME WITH YOU AND STAY IN THE WAITING ROOM ONLY DURING PRE OP AND PROCEDURE DAY OF SURGERY. THE 1 VISITOR MAY VISIT WITH YOU AFTER SURGERY IN YOUR PRIVATE ROOM DURING VISITING HOURS ONLY!  YOU NEED TO HAVE A COVID 19 TEST ON____10-12-2020___ @___9 :00AM____, THIS TEST MUST BE DONE BEFORE SURGERY, COME  [address] [place] , [id number].  ([place]) ONCE YOUR COVID TEST IS COMPLETED, PLEASE BEGIN THE QUARANTINE INSTRUCTIONS AS OUTLINED IN YOUR HANDOUT.                [name]     Your procedure is scheduled on: 06-15-2019    Report to [facility] Main  Entrance     Report to admitting at 7:52AM     Call this number if you have problems the morning of surgery [place], NO CHEWING GUM CANDY OR MINTS.    Do not eat food After Midnight. YOU MAY HAVE CLEAR LIQUIDS FROM MIDNIGHT UNTIL 7:22AM. At 7:22AM Please finish the prescribed Pre-Surgery Gatorade drink. Nothing by mouth after you finish the Gatorade drink !   CLEAR LIQUID DIET   Foods Allowed                                                                     Foods Excluded  Coffee and tea, regular and decaf                             liquids that you cannot  Plain Jell-O any favor except red or purple                                           see through such as: Fruit ices (not with fruit pulp)                                     milk, soups, orange juice  Iced Popsicles                                    All solid food Carbonated beverages, regular and diet                                    Cranberry, grape and apple juices Sports drinks like Gatorade Lightly seasoned clear broth or consume(fat free) Sugar, honey syrup  Sample Menu Breakfast                                Lunch  Supper Cranberry juice                    Beef broth                             Chicken broth Jell-O                                     Grape juice                           Apple juice Coffee or tea                        Jell-O                                      Popsicle                                                Coffee or tea                        Coffee or tea  _____________________________________________________________________    Take these medicines the morning of surgery with A SIP OF WATER: Carvedilol, Pantoprazole, Crestor     How to Manage Your Diabetes Before and After Surgery  Why is it important to control my blood sugar before and after surgery? . Improving blood sugar levels before and after surgery helps healing and can limit problems. . A way of improving blood sugar control is eating a healthy diet by: o  Eating less sugar and carbohydrates o  Increasing activity/exercise o  Talking with your doctor about reaching your blood sugar goals . High blood sugars (greater than 180 mg/dL) can raise your risk of infections and slow your recovery, so you will need to focus on controlling your diabetes during the weeks before surgery. . Make sure that the doctor who takes care of your diabetes knows about your planned surgery including the date and location.  How do I manage my blood sugar before surgery? . Check your blood sugar at least 4 times a day, starting 2 days before surgery, to make sure that the level is not too high or low. o Check your blood sugar the morning of your surgery when you wake up and every 2 hours until you get to the Short Stay unit. . If your blood sugar is less than 70 mg/dL, you will need to treat for low blood sugar: o Do not take insulin. o Treat a low blood sugar (less than 70 mg/dL) with  cup of clear juice (cranberry or apple), 4 glucose tablets, OR glucose gel. o Recheck blood sugar in 15 minutes after treatment (to make sure it is greater than 70 mg/dL). If your blood sugar is not  greater than 70 mg/dL on recheck, call [phone] for further instructions. . Report your blood sugar to the short stay nurse when you get to Short Stay.  . If you are admitted to the hospital after surgery: o Your blood sugar will be checked by  the staff and you will probably be given insulin after surgery (instead of oral diabetes medicines) to make sure you have good blood sugar levels. o The goal for blood sugar control after surgery is 80-180 mg/dL.   WHAT DO I DO ABOUT MY DIABETES MEDICATION?  Marland Kitchen Do not take oral diabetes medicines (pills) the morning of surgery.  . THE DAY BEFORE SURGERY, do not take [place]      . THE MORNING OF SURGERY, do not take [place] and Endorsed by [facility] Patient Education Committee, August 2015                               You may not have any metal on your body including hair pins and              piercings  Do not wear jewelry, make-up, lotions, powders or perfumes, deodorant                          Men may shave face and neck.   Do not bring valuables to the hospital. [place].  Contacts, dentures or bridgework may not be worn into surgery.  YOU MAY BRING A SMALL OVERNIGHT BAG              Please read over the following fact sheets you were given: _____________________________________________________________________             [facility] - Preparing for Surgery Before surgery, you can play an important role.  Because skin is not sterile, your skin needs to be as free of germs as possible.  You can reduce the number of germs on your skin by washing with CHG (chlorahexidine gluconate) soap before surgery.  CHG is an antiseptic cleaner which kills germs and bonds with the skin to continue killing germs even after washing. Please DO NOT use if you have an allergy to CHG or antibacterial soaps.  If your skin becomes reddened/irritated stop using the CHG and inform your  nurse when you arrive at Short Stay. Do not shave (including legs and underarms) for at least 48 hours prior to the first CHG shower.  You may shave your face/neck. Please follow these instructions carefully:  1.  Shower with CHG Soap the night before surgery and the  morning of Surgery.  2.  If you choose to wash your hair, wash your hair first as usual with your  normal  shampoo.  3.  After you shampoo, rinse your hair and body thoroughly to remove the  shampoo.                           4.  Use CHG as you would any other liquid soap.  You can apply chg directly  to the skin and wash                       Gently with a scrungie or clean washcloth.  5.  Apply the CHG Soap to your body ONLY FROM THE NECK DOWN.   Do not use on face/ open  Wound or open sores. Avoid contact with eyes, ears mouth and genitals (private parts).                       Wash face,  Genitals (private parts) with your normal soap.             6.  Wash thoroughly, paying special attention to the area where your surgery  will be performed.  7.  Thoroughly rinse your body with warm water from the neck down.  8.  DO NOT shower/wash with your normal soap after using and rinsing off  the CHG Soap.                9.  Pat yourself dry with a clean towel.            10.  Wear clean pajamas.            11.  Place clean sheets on your bed the night of your first shower and do not  sleep with pets. Day of Surgery : Do not apply any lotions/deodorants the morning of surgery.  Please wear clean clothes to the hospital/surgery center.  FAILURE TO FOLLOW THESE INSTRUCTIONS MAY RESULT IN THE CANCELLATION OF YOUR SURGERY PATIENT SIGNATURE_________________________________  NURSE SIGNATURE__________________________________  ________________________________________________________________________   [name]  An incentive spirometer is a tool that can help keep your lungs clear and active. This tool  measures how well you are filling your lungs with each breath. Taking long deep breaths may help reverse or decrease the chance of developing breathing (pulmonary) problems (especially infection) following:  A long period of time when you are unable to move or be active. BEFORE THE PROCEDURE   If the spirometer includes an indicator to show your best effort, your nurse or respiratory therapist will set it to a desired goal.  If possible, sit up straight or lean slightly forward. Try not to slouch.  Hold the incentive spirometer in an upright position. INSTRUCTIONS FOR USE  1. Sit on the edge of your bed if possible, or sit up as far as you can in bed or on a chair. 2. Hold the incentive spirometer in an upright position. 3. Breathe out normally. 4. Place the mouthpiece in your mouth and seal your lips tightly around it. 5. Breathe in slowly and as deeply as possible, raising the piston or the ball toward the top of the column. 6. Hold your breath for 3-5 seconds or for as long as possible. Allow the piston or ball to fall to the bottom of the column. 7. Remove the mouthpiece from your mouth and breathe out normally. 8. Rest for a few seconds and repeat Steps 1 through 7 at least 10 times every 1-2 hours when you are awake. Take your time and take a few normal breaths between deep breaths. 9. The spirometer may include an indicator to show your best effort. Use the indicator as a goal to work toward during each repetition. 10. After each set of 10 deep breaths, practice coughing to be sure your lungs are clear. If you have an incision (the cut made at the time of surgery), support your incision when coughing by placing a pillow or rolled up towels firmly against it. Once you are able to get out of bed, walk around indoors and cough well. You may stop using the incentive spirometer when instructed by your caregiver.  RISKS AND COMPLICATIONS  Take your time so you do not get  dizzy or  light-headed.  If you are in pain, you may need to take or ask for pain medication before doing incentive spirometry. It is harder to take a deep breath if you are having pain. AFTER USE  Rest and breathe slowly and easily.  It can be helpful to keep track of a log of your progress. Your caregiver can provide you with a simple table to help with this. If you are using the spirometer at home, follow these instructions: Short IF:   You are having difficultly using the spirometer.  You have trouble using the spirometer as often as instructed.  Your pain medication is not giving enough relief while using the spirometer.  You develop fever of 100.5 F (38.1 C) or higher. SEEK IMMEDIATE MEDICAL CARE IF:   You cough up bloody sputum that had not been present before.  You develop fever of 102 F (38.9 C) or greater.  You develop worsening pain at or near the incision site. MAKE SURE YOU:   Understand these instructions.  Will watch your condition.  Will get help right away if you are not doing well or get worse. Document Released: 12/28/2006 Document Revised: 11/09/2011 Document Reviewed: 02/28/2007 ExitCare Patient Information 2014 ExitCare, [place].   ________________________________________________________________________  WHAT IS A BLOOD TRANSFUSION? Blood Transfusion Information  A transfusion is the replacement of blood or some of its parts. Blood is made up of multiple cells which provide different functions.  Red blood cells carry oxygen and are used for blood loss replacement.  White blood cells fight against infection.  Platelets control bleeding.  Plasma helps clot blood.  Other blood products are available for specialized needs, such as hemophilia or other clotting disorders. BEFORE THE TRANSFUSION  Who gives blood for transfusions?   Healthy volunteers who are fully evaluated to make sure their blood is safe. This is blood bank  blood. Transfusion therapy is the safest it has ever been in the practice of medicine. Before blood is taken from a donor, a complete history is taken to make sure that person has no history of diseases nor engages in risky social behavior (examples are intravenous drug use or sexual activity with multiple partners). The donor's travel history is screened to minimize risk of transmitting infections, such as malaria. The donated blood is tested for signs of infectious diseases, such as HIV and hepatitis. The blood is then tested to be sure it is compatible with you in order to minimize the chance of a transfusion reaction. If you or a relative donates blood, this is often done in anticipation of surgery and is not appropriate for emergency situations. It takes many days to process the donated blood. RISKS AND COMPLICATIONS Although transfusion therapy is very safe and saves many lives, the main dangers of transfusion include:   Getting an infectious disease.  Developing a transfusion reaction. This is an allergic reaction to something in the blood you were given. Every precaution is taken to prevent this. The decision to have a blood transfusion has been considered carefully by your caregiver before blood is given. Blood is not given unless the benefits outweigh the risks. AFTER THE TRANSFUSION  Right after receiving a blood transfusion, you will usually feel much better and more energetic. This is especially true if your red blood cells have gotten low (anemic). The transfusion raises the level of the red blood cells which carry oxygen, and this usually causes an energy increase.  The nurse administering the transfusion will  monitor you carefully for complications. HOME CARE INSTRUCTIONS  No special instructions are needed after a transfusion. You may find your energy is better. Speak with your caregiver about any limitations on activity for underlying diseases you may have. SEEK MEDICAL CARE IF:    Your condition is not improving after your transfusion.  You develop redness or irritation at the intravenous (IV) site. SEEK IMMEDIATE MEDICAL CARE IF:  Any of the following symptoms occur over the next 12 hours:  Shaking chills.  You have a temperature by mouth above 102 F (38.9 C), not controlled by medicine.  Chest, back, or muscle pain.  People around you feel you are not acting correctly or are confused.  Shortness of breath or difficulty breathing.  Dizziness and fainting.  You get a rash or develop hives.  You have a decrease in urine output.  Your urine turns a dark color or changes to pink, red, or brown. Any of the following symptoms occur over the next 10 days:  You have a temperature by mouth above 102 F (38.9 C), not controlled by medicine.  Shortness of breath.  Weakness after normal activity.  The white part of the eye turns yellow (jaundice).  You have a decrease in the amount of urine or are urinating less often.  Your urine turns a dark color or changes to pink, red, or brown. Document Released: 08/14/2000 Document Revised: 11/09/2011 Document Reviewed: 04/02/2008 [facility] Patient Information 2014 [place], [place].  _______________________________________________________________________

## 2019-06-07 DIAGNOSIS — M25561 Pain in right knee: Secondary | ICD-10-CM | POA: Diagnosis not present
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
## 2019-06-09 ENCOUNTER — Encounter (HOSPITAL_COMMUNITY)
Admission: RE | Admit: 2019-06-09 | Discharge: 2019-06-09 | Disposition: A | Discharge status: Home / Self Care | Payer: 59 | Source: Ambulatory Visit | Attending: Orthopedic Surgery | Admitting: Orthopedic Surgery

## 2019-06-09 ENCOUNTER — Other Ambulatory Visit: Payer: Self-pay

## 2019-06-09 ENCOUNTER — Encounter (HOSPITAL_COMMUNITY): Payer: Self-pay

## 2019-06-09 DIAGNOSIS — E119 Type 2 diabetes mellitus without complications: Secondary | ICD-10-CM | POA: Diagnosis not present

## 2019-06-09 DIAGNOSIS — Z01818 Encounter for other preprocedural examination: Secondary | ICD-10-CM | POA: Diagnosis not present

## 2019-06-09 LAB — CBC
HCT: 52.9 % — ABNORMAL HIGH (ref 39.0–52.0)
Hemoglobin: 18.1 g/dL — ABNORMAL HIGH (ref 13.0–17.0)
MCH: 30.1 pg (ref 26.0–34.0)
MCHC: 34.2 g/dL (ref 30.0–36.0)
MCV: 88 fL (ref 80.0–100.0)
Platelets: 229 10*3/uL (ref 150–400)
RBC: 6.01 MIL/uL — ABNORMAL HIGH (ref 4.22–5.81)
RDW: 13.4 % (ref 11.5–15.5)
WBC: 7 10*3/uL (ref 4.0–10.5)
nRBC: 0 % (ref 0.0–0.2)

## 2019-06-09 LAB — BASIC METABOLIC PANEL
Anion gap: 11 (ref 5–15)
BUN: 18 mg/dL (ref 6–20)
CO2: 22 mmol/L (ref 22–32)
Calcium: 9.5 mg/dL (ref 8.9–10.3)
Chloride: 105 mmol/L (ref 98–111)
Creatinine, Ser: 0.98 mg/dL (ref 0.61–1.24)
GFR calc Af Amer: >60 mL/min (ref >60)
GFR calc non Af Amer: >60 mL/min (ref >60)
Glucose, Bld: 150 mg/dL — ABNORMAL HIGH (ref 70–99)
Potassium: 4.3 mmol/L (ref 3.5–5.1)
Sodium: 138 mmol/L (ref 135–145)

## 2019-06-09 LAB — HEMOGLOBIN A1C
Hgb A1c MFr Bld: 6.1 % — ABNORMAL HIGH (ref 4.8–5.6)
Mean Plasma Glucose: 128.37 mg/dL

## 2019-06-09 LAB — SURGICAL PCR SCREEN
MRSA, PCR: NEGATIVE
Staphylococcus aureus: NEGATIVE

## 2019-06-09 LAB — GLUCOSE, CAPILLARY: Glucose-Capillary: 178 mg/dL — ABNORMAL HIGH (ref 70–99)

## 2019-06-09 NOTE — Progress Notes (Addendum)
PCP - Denita Lung, MD Cardiologist -   Chest x-ray - epic 2020 EKG - epic november 2019 Stress Test - epic 2019 ECHO - epic 2019 Cardiac Cath -   Sleep Study - yes CPAP -yes ,  uses cpap nightly  Fasting Blood Sugar - 178 Checks Blood Sugar __1___ times a day  Blood Thinner Instructions:xarelto, per Dr Rayann Heman , hold x24 hours, patient states Alvan Dame advised him to hold x72 hours.  Aspirin Instructions: Last Dose:  Anesthesia review:   Hx of Afib mgd on Xarelto. Vitals WDL today  . Reports no recent acute changes in his state of health since surgery in July.   Patient denies shortness of breath, fever, cough and chest pain at PAT appointment   Patient verbalized understanding of instructions that were given to them at the PAT appointment. Patient was also instructed that they will need to review over the PAT instructions again at home before surgery.

## 2019-06-12 ENCOUNTER — Other Ambulatory Visit (HOSPITAL_COMMUNITY)
Admission: RE | Admit: 2019-06-12 | Discharge: 2019-06-12 | Disposition: A | Discharge status: Home / Self Care | Payer: 59 | Source: Ambulatory Visit | Attending: Orthopedic Surgery | Admitting: Orthopedic Surgery

## 2019-06-12 DIAGNOSIS — I4811 Longstanding persistent atrial fibrillation: Secondary | ICD-10-CM | POA: Diagnosis not present

## 2019-06-12 DIAGNOSIS — G4733 Obstructive sleep apnea (adult) (pediatric): Secondary | ICD-10-CM | POA: Diagnosis not present

## 2019-06-12 DIAGNOSIS — T8453XA Infection and inflammatory reaction due to internal right knee prosthesis, initial encounter: Secondary | ICD-10-CM | POA: Diagnosis not present

## 2019-06-12 DIAGNOSIS — Z20828 Contact with and (suspected) exposure to other viral communicable diseases: Secondary | ICD-10-CM | POA: Diagnosis not present

## 2019-06-12 DIAGNOSIS — I10 Essential (primary) hypertension: Secondary | ICD-10-CM | POA: Diagnosis not present

## 2019-06-12 DIAGNOSIS — E119 Type 2 diabetes mellitus without complications: Secondary | ICD-10-CM | POA: Diagnosis not present

## 2019-06-12 DIAGNOSIS — G4730 Sleep apnea, unspecified: Secondary | ICD-10-CM | POA: Diagnosis not present

## 2019-06-12 DIAGNOSIS — K5790 Diverticulosis of intestine, part unspecified, without perforation or abscess without bleeding: Secondary | ICD-10-CM | POA: Diagnosis not present

## 2019-06-12 DIAGNOSIS — K219 Gastro-esophageal reflux disease without esophagitis: Secondary | ICD-10-CM | POA: Diagnosis not present

## 2019-06-12 DIAGNOSIS — E785 Hyperlipidemia, unspecified: Secondary | ICD-10-CM | POA: Diagnosis not present

## 2019-06-12 LAB — SARS CORONAVIRUS 2 (TAT 6-24 HRS): SARS Coronavirus 2: NEGATIVE

## 2019-06-12 MED FILL — ROSUVASTATIN CALCIUM 10 MG: 10 | 90 days supply | Qty: 90 | Fill #1

## 2019-06-12 MED FILL — ENTRESTO 24 MG-26 MG TABLET: 24-26 | 30 days supply | Qty: 60 | Fill #2

## 2019-06-12 MED FILL — METHOCARBAMOL 500 MG TABS: 500 | 15 days supply | Qty: 60 | Fill #0

## 2019-06-12 MED FILL — JARDIANCE 10 MG TABLET: 10 | 30 days supply | Qty: 30 | Fill #2

## 2019-06-12 NOTE — Anesthesia Preprocedure Evaluation (Addendum)
Anesthesia Evaluation  Patient identified by MRN, date of birth, ID band Patient awake    Reviewed: Allergy & Precautions, NPO status , Patient's Chart, lab work & pertinent test results  Airway Mallampati: II  TM Distance: >3 FB Neck ROM: Full    Dental no notable dental hx.    Pulmonary sleep apnea ,    Pulmonary exam normal breath sounds clear to auscultation       Cardiovascular hypertension, Pt. on medications negative cardio ROS Normal cardiovascular exam Rhythm:Regular Rate:Normal     Neuro/Psych TIAnegative psych ROS   GI/Hepatic Neg liver ROS, GERD  ,  Endo/Other  negative endocrine ROSdiabetes, Type 2  Renal/GU Renal InsufficiencyRenal disease  negative genitourinary   Musculoskeletal  (+) Arthritis , Osteoarthritis,    Abdominal (+) + obese,   Peds negative pediatric ROS (+)  Hematology negative hematology ROS (+)   Anesthesia Other Findings   Reproductive/Obstetrics negative OB ROS                            Anesthesia Physical Anesthesia Plan  ASA: III  Anesthesia Plan: Spinal   Post-op Pain Management:  Regional for Post-op pain   Induction: Intravenous  PONV Risk Score and Plan: 1 and Ondansetron and Treatment may vary due to age or medical condition  Airway Management Planned: Simple Face Mask  Additional Equipment:   Intra-op Plan:   Post-operative Plan:   Informed Consent: I have reviewed the patients History and Physical, chart, labs and discussed the procedure including the risks, benefits and alternatives for the proposed anesthesia with the patient or authorized representative who has indicated his/her understanding and acceptance.     Dental advisory given  Plan Discussed with: CRNA  Anesthesia Plan Comments: (See PAT note 06/09/2019, Konrad Felix, PA-C)       Anesthesia Quick Evaluation

## 2019-06-12 NOTE — Progress Notes (Signed)
Anesthesia Chart Review   Case: [id number] Date/Time: 06/15/19 1007   Procedure: Resection of antibiotic spacer and reimplantation revision of right total knee arthroplasty versus irrigation and debridment and placement of antibiotic spacer (Right Knee) - 90 mins   Anesthesia type: Spinal   Pre-op diagnosis: Status post resection right total knee and placement of antibiotic spacer   [facility] ROOM 09 / [facility]   Surgeon: [name], MD      DISCUSSION:58 y.o. never smoker with h/o HTN, GERD, A-fib (Xarelto), Nonischemic CM, DM II, sleep apnea w/CPAP, TIA 2009, s/p resection right total knee and placement of antibiotic spacers   Last visit with cardiologist, Dr. Thompson Grayer, 05/24/2019.  At this visit doing well, asymptomatic, rate controlled.  Per OV note, "OK to proceed to knee surgery without further EP intervention.  Hold Xarelto 24 hours prior to surgery and resume when able afterwards."  S/p right total knee arthroplasty excision with placement of antibiotic spacers 03/28/2019 with no anesthesia complications noted.   Anticipate pt can proceed with planned procedure barring acute status change.   VS: BP 118/79   Pulse 80   Temp 36.7 C (Oral)   Resp 18   Ht 5' 9.5" (1.765 m)   Wt 117.5 kg   SpO2 99%   BMI 37.70 kg/m   PROVIDERS: Denita Lung, MD  Thompson Grayer, MD is Cardiologist   Michel Bickers, MD with ID LABS: Labs reviewed: Acceptable for surgery. (all labs ordered are listed, but only abnormal results are displayed)  Labs Reviewed  HEMOGLOBIN A1C - Abnormal; Notable for the following components:      Result Value   Hgb A1c MFr Bld 6.1 (*)    All other components within normal limits  BASIC METABOLIC PANEL - Abnormal; Notable for the following components:   Glucose, Bld 150 (*)    All other components within normal limits  CBC - Abnormal; Notable for the following components:   RBC 6.01 (*)    Hemoglobin 18.1 (*)    HCT 52.9 (*)    All other components  within normal limits  GLUCOSE, CAPILLARY - Abnormal; Notable for the following components:   Glucose-Capillary 178 (*)    All other components within normal limits  SURGICAL PCR SCREEN  TYPE AND SCREEN     IMAGES:   EKG: 07/11/2018 Rate 103 bpm Atrial fibrillation with rapid ventricular response Low voltage QRS Septal infarct, age undetermined  CV: Myocardial Perfusion Imaging 05/23/2018  Nuclear stress EF: 28%.  There was no ST segment deviation noted during stress.  This is an intermediate risk study.  The left ventricular ejection fraction is severely decreased (<30%).  Findings consistent with prior myocardial infarction.   1. EF 28%, diffuse hypokinesis appearing worse towards the apex.  2. Fixed small, moderate intensity apical inferior and apical perfusion defect.  This is suggestive of prior infarction, no significant ischemia.   Intermediate risk study due to low EF.   Echo 05/09/2018 Study Conclusions  - Procedure narrative: Transthoracic echocardiography. Image   quality was suboptimal. The study was technically difficult.   Intravenous contrast (Definity) was administered to opacify the   LV. - Left ventricle: The cavity size was normal. Wall thickness was   increased in a pattern of mild LVH. Systolic function was   moderately reduced. The estimated ejection fraction was in the   range of 35% to 40%. Diffuse hypokinesis. The study was not   technically sufficient to allow evaluation of LV diastolic  dysfunction due to atrial fibrillation. - Aortic valve: There was no stenosis. - Aorta: Dilated aortic root and ascending aorta. Aortic root   dimension: 42 mm (ED). Ascending aortic diameter: 46 mm (S). - Mitral valve: There was trivial regurgitation. - Left atrium: The atrium was moderately dilated. - Right ventricle: The cavity size was mildly dilated. Systolic   function was normal. - Right atrium: The atrium was moderately dilated. - Pulmonary  arteries: No complete TR doppler jet so unable to   estimate PA systolic pressure. - Systemic veins: IVC measured 2 cm with < 50% respirophasic   variation, suggesting RA pressure 8 mmHg. - Pericardium, extracardiac: A trivial pericardial effusion was   identified.  Impressions:  - The patient was in atrial fibrillation. Normal LV size with mild   LVH. EF 35-40%, diffuse hypokinesis. Mildly dilated RV with   normal systolic function. No significant valvular abnormalities.   Moderately dilated ascending aorta.  Past Medical History:  Diagnosis Date  . Arthritis   . Diabetes mellitus without complication ([place])    TYPE 2  . Diverticulosis   . Dyslipidemia   . GERD (gastroesophageal reflux disease)   . Herpes labialis   . Hypertension   . Longstanding persistent atrial fibrillation ([place])   . Metabolic syndrome   . Obesity   . Proteinuria   . Psoriasis   . Seborrheic dermatitis   . Sleep apnea    very compliant with CPAP, PT NEEDS TO BRING OWN MACHINE  . TIA (transient ischemic attack) 2009    Past Surgical History:  Procedure Laterality Date  . CARDIOVERSION N/A 12/02/2016   Procedure: CARDIOVERSION;  Surgeon: Pixie Casino, MD;  Location: [facility];  Service: Cardiovascular;  Laterality: N/A;  . COLONOSCOPY    . EXCISIONAL TOTAL KNEE ARTHROPLASTY WITH ANTIBIOTIC SPACERS Right 03/28/2019   Procedure: EXCISIONAL TOTAL KNEE ARTHROPLASTY WITH ANTIBIOTIC SPACERS;  Surgeon: [name], MD;  Location: [facility];  Service: Orthopedics;  Laterality: Right;  90 mins  . I&D KNEE WITH POLY EXCHANGE Right 09/10/2018   Procedure: Right Knee Arthroplasty IRRIGATION AND DEBRIDEMENT KNEE WITH POLY EXCHANGE;  Surgeon: [name], MD;  Location: [facility];  Service: Orthopedics;  Laterality: Right;  . JOINT REPLACEMENT  11/03/10   LT HIP  . PFO occluder cardiac valve  2006   Dr. Einar Gip, (hole in heart)  . TONSILLECTOMY AND ADENOIDECTOMY    . TOTAL HIP ARTHROPLASTY Left   . TOTAL KNEE  ARTHROPLASTY Right 05/31/2018   Procedure: RIGHT TOTAL KNEE ARTHROPLASTY;  Surgeon: [name], MD;  Location[facility];  Service: Orthopedics;  Laterality: Right;  70 mins  . TOTAL KNEE ARTHROPLASTY Left 06/28/2018   Procedure: LEFT TOTAL KNEE ARTHROPLASTY;  Surgeon: [name], MD;  Location[facility];  Service: Orthopedics;  Laterality: Left;  70 mins  . WISDOM TOOTH EXTRACTION      MEDICATIONS: . Accu-Chek FastClix Lancets MISC  . carvedilol (COREG) 25 MG tablet  . glucose blood (ACCU-CHEK GUIDE) test strip  . Ixekizumab (TALTZ) 80 MG/ML SOAJ  . JARDIANCE 10 MG TABS tablet  . methocarbamol (ROBAXIN) 500 MG tablet  . Multiple Vitamin (MULTIVITAMIN WITH MINERALS) TABS tablet  . oxymetazoline (AFRIN) 0.05 % nasal spray  . pantoprazole (PROTONIX) 40 MG tablet  . rosuvastatin (CRESTOR) 10 MG tablet  . sacubitril-valsartan (ENTRESTO) 24-26 MG  . Vitamin D, Cholecalciferol, 1000 units CAPS  . XARELTO 20 MG TABS tablet   No current facility-administered medications for this encounter.  Maia Plan [facility] Pre-Surgical Testing [phone] 06/12/19  3:38 PM

## 2019-06-14 MED FILL — ACCU-CHEK FASTCLIX LANCETS: 51 days supply | Qty: 102 | Fill #1

## 2019-06-14 MED FILL — ACCU-CHEK GUIDE TEST STRIP: 50 days supply | Qty: 100 | Fill #1

## 2019-06-15 ENCOUNTER — Inpatient Hospital Stay (HOSPITAL_COMMUNITY)
Admission: RE | Admit: 2019-06-15 | Discharge: 2019-06-16 | DRG: 467 | Disposition: A | Discharge status: Home / Self Care | Payer: 59 | Source: Ambulatory Visit | Attending: Orthopedic Surgery | Admitting: Orthopedic Surgery

## 2019-06-15 ENCOUNTER — Inpatient Hospital Stay: Payer: Self-pay

## 2019-06-15 ENCOUNTER — Inpatient Hospital Stay (HOSPITAL_COMMUNITY): Payer: 59

## 2019-06-15 ENCOUNTER — Encounter (HOSPITAL_COMMUNITY)
Admission: RE | Disposition: A | Discharge status: Home / Self Care | Payer: Self-pay | Source: Ambulatory Visit | Attending: Orthopedic Surgery

## 2019-06-15 ENCOUNTER — Encounter (HOSPITAL_COMMUNITY): Payer: Self-pay | Admitting: Certified Registered Nurse Anesthetist

## 2019-06-15 ENCOUNTER — Inpatient Hospital Stay (HOSPITAL_COMMUNITY): Payer: 59 | Admitting: Physician Assistant

## 2019-06-15 ENCOUNTER — Other Ambulatory Visit: Payer: Self-pay

## 2019-06-15 ENCOUNTER — Inpatient Hospital Stay (HOSPITAL_COMMUNITY): Payer: 59 | Admitting: Certified Registered Nurse Anesthetist

## 2019-06-15 DIAGNOSIS — L409 Psoriasis, unspecified: Secondary | ICD-10-CM | POA: Diagnosis present

## 2019-06-15 DIAGNOSIS — A4901 Methicillin susceptible Staphylococcus aureus infection, unspecified site: Secondary | ICD-10-CM | POA: Diagnosis not present

## 2019-06-15 DIAGNOSIS — Z8619 Personal history of other infectious and parasitic diseases: Secondary | ICD-10-CM | POA: Diagnosis not present

## 2019-06-15 DIAGNOSIS — Z20828 Contact with and (suspected) exposure to other viral communicable diseases: Secondary | ICD-10-CM | POA: Diagnosis present

## 2019-06-15 DIAGNOSIS — Z89529 Acquired absence of unspecified knee: Secondary | ICD-10-CM

## 2019-06-15 DIAGNOSIS — M1990 Unspecified osteoarthritis, unspecified site: Secondary | ICD-10-CM | POA: Diagnosis present

## 2019-06-15 DIAGNOSIS — K219 Gastro-esophageal reflux disease without esophagitis: Secondary | ICD-10-CM | POA: Diagnosis present

## 2019-06-15 DIAGNOSIS — Z89521 Acquired absence of right knee: Secondary | ICD-10-CM | POA: Diagnosis not present

## 2019-06-15 DIAGNOSIS — E785 Hyperlipidemia, unspecified: Secondary | ICD-10-CM | POA: Diagnosis present

## 2019-06-15 DIAGNOSIS — K5790 Diverticulosis of intestine, part unspecified, without perforation or abscess without bleeding: Secondary | ICD-10-CM | POA: Diagnosis present

## 2019-06-15 DIAGNOSIS — Z96651 Presence of right artificial knee joint: Secondary | ICD-10-CM | POA: Diagnosis not present

## 2019-06-15 DIAGNOSIS — E119 Type 2 diabetes mellitus without complications: Secondary | ICD-10-CM | POA: Diagnosis present

## 2019-06-15 DIAGNOSIS — Z885 Allergy status to narcotic agent status: Secondary | ICD-10-CM

## 2019-06-15 DIAGNOSIS — Y831 Surgical operation with implant of artificial internal device as the cause of abnormal reaction of the patient, or of later complication, without mention of misadventure at the time of the procedure: Secondary | ICD-10-CM | POA: Diagnosis present

## 2019-06-15 DIAGNOSIS — Z96643 Presence of artificial hip joint, bilateral: Secondary | ICD-10-CM | POA: Diagnosis present

## 2019-06-15 DIAGNOSIS — Z95828 Presence of other vascular implants and grafts: Secondary | ICD-10-CM

## 2019-06-15 DIAGNOSIS — I4811 Longstanding persistent atrial fibrillation: Secondary | ICD-10-CM | POA: Diagnosis present

## 2019-06-15 DIAGNOSIS — T8453XA Infection and inflammatory reaction due to internal right knee prosthesis, initial encounter: Principal | ICD-10-CM

## 2019-06-15 DIAGNOSIS — G4730 Sleep apnea, unspecified: Secondary | ICD-10-CM | POA: Diagnosis present

## 2019-06-15 DIAGNOSIS — G8918 Other acute postprocedural pain: Secondary | ICD-10-CM | POA: Diagnosis not present

## 2019-06-15 DIAGNOSIS — Z8673 Personal history of transient ischemic attack (TIA), and cerebral infarction without residual deficits: Secondary | ICD-10-CM

## 2019-06-15 DIAGNOSIS — E1121 Type 2 diabetes mellitus with diabetic nephropathy: Secondary | ICD-10-CM | POA: Diagnosis not present

## 2019-06-15 DIAGNOSIS — T8459XA Infection and inflammatory reaction due to other internal joint prosthesis, initial encounter: Secondary | ICD-10-CM | POA: Diagnosis not present

## 2019-06-15 DIAGNOSIS — M65861 Other synovitis and tenosynovitis, right lower leg: Secondary | ICD-10-CM | POA: Diagnosis not present

## 2019-06-15 DIAGNOSIS — I10 Essential (primary) hypertension: Secondary | ICD-10-CM | POA: Diagnosis present

## 2019-06-15 DIAGNOSIS — Z452 Encounter for adjustment and management of vascular access device: Secondary | ICD-10-CM | POA: Diagnosis not present

## 2019-06-15 HISTORY — PX: REIMPLANTATION OF TOTAL KNEE: SHX6052

## 2019-06-15 LAB — TYPE AND SCREEN
ABO/RH(D): O POS
Antibody Screen: NEGATIVE

## 2019-06-15 LAB — GLUCOSE, CAPILLARY
Glucose-Capillary: 166 mg/dL — ABNORMAL HIGH (ref 70–99)
Glucose-Capillary: 153 mg/dL — ABNORMAL HIGH (ref 70–99)
Glucose-Capillary: 171 mg/dL — ABNORMAL HIGH (ref 70–99)
Glucose-Capillary: 158 mg/dL — ABNORMAL HIGH (ref 70–99)

## 2019-06-15 SURGERY — REVISION, TOTAL ARTHROPLASTY, KNEE
Anesthesia: Spinal | Site: Knee | Laterality: Right

## 2019-06-15 MED ORDER — METHOCARBAMOL 500 MG PO TABS
500.0000 mg | ORAL_TABLET | Freq: Four times a day (QID) | ORAL | Status: DC | PRN
  Administered 2019-06-15: 500 mg via ORAL
  Filled 2019-06-15 (×2): qty 1

## 2019-06-15 MED ORDER — OXYCODONE HCL 5 MG/5ML PO SOLN: 5.0000 mg | Freq: Once | ORAL | Status: DC | PRN

## 2019-06-15 MED ORDER — CHLORHEXIDINE GLUCONATE CLOTH 2 % EX PADS
6.0000 | MEDICATED_PAD | Freq: Every day | CUTANEOUS | Status: DC
  Administered 2019-06-16: 6 via TOPICAL

## 2019-06-15 MED ORDER — VANCOMYCIN HCL IN DEXTROSE 1-5 GM/200ML-% IV SOLN
1000.0000 mg | Freq: Two times a day (BID) | INTRAVENOUS | Status: DC
  Administered 2019-06-16: 1000 mg via INTRAVENOUS
  Filled 2019-06-15: qty 200

## 2019-06-15 MED ORDER — SODIUM CHLORIDE (PF) 0.9 % IJ SOLN
INTRAMUSCULAR | Status: AC
  Filled 2019-06-15: qty 50

## 2019-06-15 MED ORDER — FERROUS SULFATE 325 (65 FE) MG PO TABS
325.0000 mg | ORAL_TABLET | Freq: Two times a day (BID) | ORAL | Status: DC
  Administered 2019-06-16: 08:00:00 325 mg via ORAL
  Filled 2019-06-15 (×2): qty 1

## 2019-06-15 MED ORDER — CELECOXIB 200 MG PO CAPS
200.0000 mg | ORAL_CAPSULE | Freq: Two times a day (BID) | ORAL | Status: DC
  Administered 2019-06-15 – 2019-06-16 (×2): 200 mg via ORAL
  Filled 2019-06-15 (×2): qty 1

## 2019-06-15 MED ORDER — MIDAZOLAM HCL 2 MG/2ML IJ SOLN
INTRAMUSCULAR | Status: AC
  Filled 2019-06-15: qty 2

## 2019-06-15 MED ORDER — RIVAROXABAN 10 MG PO TABS
10.0000 mg | ORAL_TABLET | ORAL | Status: DC
  Administered 2019-06-16: 10 mg via ORAL
  Filled 2019-06-15: qty 1

## 2019-06-15 MED ORDER — TRANEXAMIC ACID-NACL 1000-0.7 MG/100ML-% IV SOLN
1000.0000 mg | INTRAVENOUS | Status: AC
  Administered 2019-06-15: 1000 mg via INTRAVENOUS
  Filled 2019-06-15: qty 100

## 2019-06-15 MED ORDER — TOBRAMYCIN SULFATE 1.2 G IJ SOLR
INTRAMUSCULAR | Status: DC | PRN
  Administered 2019-06-15: 4.8 g

## 2019-06-15 MED ORDER — ONDANSETRON HCL 4 MG/2ML IJ SOLN
INTRAMUSCULAR | Status: DC | PRN
  Administered 2019-06-15: 4 mg via INTRAVENOUS

## 2019-06-15 MED ORDER — SODIUM CHLORIDE 0.9% FLUSH
10.0000 mL | Freq: Two times a day (BID) | INTRAVENOUS | Status: DC
  Administered 2019-06-15 – 2019-06-16 (×2): 10 mL

## 2019-06-15 MED ORDER — SODIUM CHLORIDE 0.9% FLUSH: 10.0000 mL | INTRAVENOUS | Status: DC | PRN

## 2019-06-15 MED ORDER — DEXAMETHASONE SODIUM PHOSPHATE 10 MG/ML IJ SOLN
10.0000 mg | Freq: Once | INTRAMUSCULAR | Status: AC
  Administered 2019-06-15: 10 mg via INTRAVENOUS

## 2019-06-15 MED ORDER — ACETAMINOPHEN 325 MG PO TABS: 325.0000 mg | ORAL_TABLET | Freq: Four times a day (QID) | ORAL | Status: DC | PRN

## 2019-06-15 MED ORDER — MIDAZOLAM HCL 5 MG/5ML IJ SOLN
INTRAMUSCULAR | Status: DC | PRN
  Administered 2019-06-15 (×2): 0.5 mg via INTRAVENOUS
  Administered 2019-06-15: 1 mg via INTRAVENOUS

## 2019-06-15 MED ORDER — METHOCARBAMOL 500 MG IVPB - SIMPLE MED
500.0000 mg | Freq: Four times a day (QID) | INTRAVENOUS | Status: DC | PRN
  Filled 2019-06-15: qty 50

## 2019-06-15 MED ORDER — HYDROCODONE-ACETAMINOPHEN 5-325 MG PO TABS: 1.0000 | ORAL_TABLET | ORAL | Status: DC | PRN

## 2019-06-15 MED ORDER — ALUM & MAG HYDROXIDE-SIMETH 200-200-20 MG/5ML PO SUSP: 15.0000 mL | ORAL | Status: DC | PRN

## 2019-06-15 MED ORDER — VANCOMYCIN HCL 1 G IV SOLR
INTRAVENOUS | Status: DC | PRN
  Administered 2019-06-15: 5 g

## 2019-06-15 MED ORDER — POVIDONE-IODINE 10 % EX SWAB
2.0000 "application " | Freq: Once | CUTANEOUS | Status: AC
  Administered 2019-06-15: 2 via TOPICAL

## 2019-06-15 MED ORDER — PROPOFOL 500 MG/50ML IV EMUL
INTRAVENOUS | Status: AC
  Filled 2019-06-15: qty 50

## 2019-06-15 MED ORDER — ONDANSETRON HCL 4 MG PO TABS: 4.0000 mg | ORAL_TABLET | Freq: Four times a day (QID) | ORAL | Status: DC | PRN

## 2019-06-15 MED ORDER — ROSUVASTATIN CALCIUM 10 MG PO TABS
10.0000 mg | ORAL_TABLET | Freq: Every day | ORAL | Status: DC
  Administered 2019-06-16: 10 mg via ORAL
  Filled 2019-06-15: qty 1

## 2019-06-15 MED ORDER — HYDROCODONE-ACETAMINOPHEN 7.5-325 MG PO TABS
1.0000 | ORAL_TABLET | ORAL | Status: DC | PRN
  Administered 2019-06-15 – 2019-06-16 (×4): 2 via ORAL
  Filled 2019-06-15 (×4): qty 2

## 2019-06-15 MED ORDER — CARVEDILOL 25 MG PO TABS
25.0000 mg | ORAL_TABLET | Freq: Two times a day (BID) | ORAL | Status: DC
  Administered 2019-06-15 – 2019-06-16 (×2): 25 mg via ORAL
  Filled 2019-06-15 (×2): qty 1

## 2019-06-15 MED ORDER — ONDANSETRON HCL 4 MG/2ML IJ SOLN
INTRAMUSCULAR | Status: AC
  Filled 2019-06-15: qty 2

## 2019-06-15 MED ORDER — OXYCODONE HCL 5 MG PO TABS: 5.0000 mg | ORAL_TABLET | Freq: Once | ORAL | Status: DC | PRN

## 2019-06-15 MED ORDER — BISACODYL 10 MG RE SUPP: 10.0000 mg | Freq: Every day | RECTAL | Status: DC | PRN

## 2019-06-15 MED ORDER — SODIUM CHLORIDE 0.9 % IR SOLN
Status: DC | PRN
  Administered 2019-06-15: 1000 mL

## 2019-06-15 MED ORDER — VANCOMYCIN HCL 10 G IV SOLR
2000.0000 mg | Freq: Once | INTRAVENOUS | Status: AC
  Administered 2019-06-15: 2000 mg via INTRAVENOUS
  Filled 2019-06-15: qty 2000

## 2019-06-15 MED ORDER — METOCLOPRAMIDE HCL 5 MG PO TABS: 5.0000 mg | ORAL_TABLET | Freq: Three times a day (TID) | ORAL | Status: DC | PRN

## 2019-06-15 MED ORDER — MIDAZOLAM HCL 2 MG/2ML IJ SOLN
1.0000 mg | INTRAMUSCULAR | Status: AC
  Administered 2019-06-15: 2 mg via INTRAVENOUS
  Filled 2019-06-15: qty 2

## 2019-06-15 MED ORDER — PHENYLEPHRINE 40 MCG/ML (10ML) SYRINGE FOR IV PUSH (FOR BLOOD PRESSURE SUPPORT)
PREFILLED_SYRINGE | INTRAVENOUS | Status: AC
  Filled 2019-06-15: qty 10

## 2019-06-15 MED ORDER — PROPOFOL 10 MG/ML IV BOLUS
INTRAVENOUS | Status: AC
  Filled 2019-06-15: qty 20

## 2019-06-15 MED ORDER — DEXAMETHASONE SODIUM PHOSPHATE 10 MG/ML IJ SOLN
10.0000 mg | Freq: Once | INTRAMUSCULAR | Status: AC
  Administered 2019-06-16: 10 mg via INTRAVENOUS
  Filled 2019-06-15: qty 1

## 2019-06-15 MED ORDER — BUPIVACAINE HCL (PF) 0.25 % IJ SOLN
INTRAMUSCULAR | Status: AC
  Filled 2019-06-15: qty 30

## 2019-06-15 MED ORDER — SODIUM CHLORIDE 0.9 % IR SOLN
Status: DC | PRN
  Administered 2019-06-15: 6000 mL

## 2019-06-15 MED ORDER — CEFAZOLIN SODIUM-DEXTROSE 2-4 GM/100ML-% IV SOLN
2.0000 g | Freq: Four times a day (QID) | INTRAVENOUS | Status: AC
  Administered 2019-06-15 (×2): 2 g via INTRAVENOUS
  Filled 2019-06-15 (×2): qty 100

## 2019-06-15 MED ORDER — KETOROLAC TROMETHAMINE 30 MG/ML IJ SOLN
INTRAMUSCULAR | Status: AC
  Filled 2019-06-15: qty 1

## 2019-06-15 MED ORDER — DEXAMETHASONE SODIUM PHOSPHATE 10 MG/ML IJ SOLN
INTRAMUSCULAR | Status: AC
  Filled 2019-06-15: qty 1

## 2019-06-15 MED ORDER — ROPIVACAINE HCL 5 MG/ML IJ SOLN
INTRAMUSCULAR | Status: DC | PRN
  Administered 2019-06-15: 20 mL via PERINEURAL

## 2019-06-15 MED ORDER — SODIUM CHLORIDE 0.9 % IV SOLN
INTRAVENOUS | Status: DC
  Administered 2019-06-15 (×2): via INTRAVENOUS

## 2019-06-15 MED ORDER — METOCLOPRAMIDE HCL 5 MG/ML IJ SOLN: 5.0000 mg | Freq: Three times a day (TID) | INTRAMUSCULAR | Status: DC | PRN

## 2019-06-15 MED ORDER — MAGNESIUM CITRATE PO SOLN: 1.0000 | Freq: Once | ORAL | Status: DC | PRN

## 2019-06-15 MED ORDER — LACTATED RINGERS IV SOLN
INTRAVENOUS | Status: DC
  Administered 2019-06-15 (×3): via INTRAVENOUS

## 2019-06-15 MED ORDER — POLYETHYLENE GLYCOL 3350 17 G PO PACK
17.0000 g | PACK | Freq: Two times a day (BID) | ORAL | Status: DC
  Administered 2019-06-16: 17 g via ORAL
  Filled 2019-06-15 (×2): qty 1

## 2019-06-15 MED ORDER — TOBRAMYCIN SULFATE 1.2 G IJ SOLR
INTRAMUSCULAR | Status: AC
  Filled 2019-06-15: qty 7.2

## 2019-06-15 MED ORDER — PHENOL 1.4 % MT LIQD
1.0000 | OROMUCOSAL | Status: DC | PRN
  Filled 2019-06-15: qty 177

## 2019-06-15 MED ORDER — FENTANYL CITRATE (PF) 100 MCG/2ML IJ SOLN
25.0000 ug | INTRAMUSCULAR | Status: DC | PRN
  Administered 2019-06-15 (×2): 25 ug via INTRAVENOUS
  Filled 2019-06-15 (×2): qty 2

## 2019-06-15 MED ORDER — TRANEXAMIC ACID-NACL 1000-0.7 MG/100ML-% IV SOLN
1000.0000 mg | Freq: Once | INTRAVENOUS | Status: AC
  Administered 2019-06-15: 1000 mg via INTRAVENOUS
  Filled 2019-06-15: qty 100

## 2019-06-15 MED ORDER — FENTANYL CITRATE (PF) 100 MCG/2ML IJ SOLN
50.0000 ug | INTRAMUSCULAR | Status: AC
  Administered 2019-06-15: 100 ug via INTRAVENOUS
  Filled 2019-06-15: qty 2

## 2019-06-15 MED ORDER — HYDROMORPHONE HCL 1 MG/ML IJ SOLN: 0.2500 mg | INTRAMUSCULAR | Status: DC | PRN

## 2019-06-15 MED ORDER — PANTOPRAZOLE SODIUM 40 MG PO TBEC
40.0000 mg | DELAYED_RELEASE_TABLET | Freq: Two times a day (BID) | ORAL | Status: DC
  Administered 2019-06-15 – 2019-06-16 (×2): 40 mg via ORAL
  Filled 2019-06-15 (×2): qty 1

## 2019-06-15 MED ORDER — SODIUM CHLORIDE 0.9 % IV SOLN
INTRAVENOUS | Status: DC | PRN
  Administered 2019-06-15: 50 ug/min via INTRAVENOUS

## 2019-06-15 MED ORDER — CHLORHEXIDINE GLUCONATE 4 % EX LIQD: 60.0000 mL | Freq: Once | CUTANEOUS | Status: DC

## 2019-06-15 MED ORDER — VANCOMYCIN HCL 1000 MG IV SOLR
INTRAVENOUS | Status: AC
  Filled 2019-06-15: qty 1000

## 2019-06-15 MED ORDER — PROMETHAZINE HCL 25 MG/ML IJ SOLN: 6.2500 mg | INTRAMUSCULAR | Status: DC | PRN

## 2019-06-15 MED ORDER — DOCUSATE SODIUM 100 MG PO CAPS
100.0000 mg | ORAL_CAPSULE | Freq: Two times a day (BID) | ORAL | Status: DC
  Administered 2019-06-15 – 2019-06-16 (×2): 100 mg via ORAL
  Filled 2019-06-15 (×2): qty 1

## 2019-06-15 MED ORDER — OXYMETAZOLINE HCL 0.05 % NA SOLN
1.0000 | Freq: Every day | NASAL | Status: DC
  Administered 2019-06-15: 1 via NASAL
  Filled 2019-06-15: qty 15

## 2019-06-15 MED ORDER — PROPOFOL 500 MG/50ML IV EMUL
INTRAVENOUS | Status: DC | PRN
  Administered 2019-06-15: 100 ug/kg/min via INTRAVENOUS

## 2019-06-15 MED ORDER — INSULIN ASPART 100 UNIT/ML ~~LOC~~ SOLN
0.0000 [IU] | Freq: Three times a day (TID) | SUBCUTANEOUS | Status: DC
  Administered 2019-06-15: 3 [IU] via SUBCUTANEOUS
  Administered 2019-06-16: 5 [IU] via SUBCUTANEOUS

## 2019-06-15 MED ORDER — ONDANSETRON HCL 4 MG/2ML IJ SOLN: 4.0000 mg | Freq: Four times a day (QID) | INTRAMUSCULAR | Status: DC | PRN

## 2019-06-15 MED ORDER — CANAGLIFLOZIN 100 MG PO TABS
100.0000 mg | ORAL_TABLET | Freq: Every day | ORAL | Status: DC
  Administered 2019-06-16: 100 mg via ORAL
  Filled 2019-06-15: qty 1

## 2019-06-15 MED ORDER — MENTHOL 3 MG MT LOZG: 1.0000 | LOZENGE | OROMUCOSAL | Status: DC | PRN

## 2019-06-15 MED ORDER — BUPIVACAINE IN DEXTROSE 0.75-8.25 % IT SOLN
INTRATHECAL | Status: DC | PRN
  Administered 2019-06-15: 1.8 mL via INTRATHECAL

## 2019-06-15 MED ORDER — CEFAZOLIN SODIUM-DEXTROSE 2-4 GM/100ML-% IV SOLN
2.0000 g | INTRAVENOUS | Status: AC
  Administered 2019-06-15: 2 g via INTRAVENOUS
  Filled 2019-06-15: qty 100

## 2019-06-15 MED ORDER — PHENYLEPHRINE 40 MCG/ML (10ML) SYRINGE FOR IV PUSH (FOR BLOOD PRESSURE SUPPORT)
PREFILLED_SYRINGE | INTRAVENOUS | Status: DC | PRN
  Administered 2019-06-15: 120 ug via INTRAVENOUS

## 2019-06-15 MED ORDER — PROPOFOL 10 MG/ML IV BOLUS
INTRAVENOUS | Status: DC | PRN
  Administered 2019-06-15: 40 mg via INTRAVENOUS
  Administered 2019-06-15: 50 mg via INTRAVENOUS

## 2019-06-15 MED ORDER — METHOCARBAMOL 500 MG IVPB - SIMPLE MED
INTRAVENOUS | Status: AC
  Administered 2019-06-15: 16:00:00
  Filled 2019-06-15: qty 50

## 2019-06-15 MED ORDER — DIPHENHYDRAMINE HCL 12.5 MG/5ML PO ELIX: 12.5000 mg | ORAL_SOLUTION | ORAL | Status: DC | PRN

## 2019-06-15 MED ORDER — VANCOMYCIN HCL 1000 MG IV SOLR
INTRAVENOUS | Status: AC
  Filled 2019-06-15: qty 5000

## 2019-06-15 SURGICAL SUPPLY — 61 items
BAG ZIPLOCK 12X15 (MISCELLANEOUS) IMPLANT
BANDAGE ESMARK 6X9 LF (GAUZE/BANDAGES/DRESSINGS) ×1 IMPLANT
BLADE SAW SGTL 13.0X1.19X90.0M (BLADE) ×2 IMPLANT
BLADE SAW SGTL 81X20 HD (BLADE) ×2 IMPLANT
BNDG ELASTIC 6X5.8 VLCR STR LF (GAUZE/BANDAGES/DRESSINGS) ×2 IMPLANT
BNDG ESMARK 6X9 LF (GAUZE/BANDAGES/DRESSINGS) ×2
BRUSH FEMORAL CANAL (MISCELLANEOUS) ×2 IMPLANT
CEMENT HV SMART SET (Cement) ×6 IMPLANT
COVER SURGICAL LIGHT HANDLE (MISCELLANEOUS) ×2 IMPLANT
COVER WAND RF STERILE (DRAPES) IMPLANT
CUFF TOURN SGL QUICK 34 (TOURNIQUET CUFF) ×1
CUFF TRNQT CYL 34X4.125X (TOURNIQUET CUFF) ×1 IMPLANT
DERMABOND ADVANCED (GAUZE/BANDAGES/DRESSINGS) ×1
DERMABOND ADVANCED .7 DNX12 (GAUZE/BANDAGES/DRESSINGS) ×1 IMPLANT
DRAPE EXTREMITY T 121X128X90 (DISPOSABLE) ×2 IMPLANT
DRAPE POUCH INSTRU U-SHP 10X18 (DRAPES) ×2 IMPLANT
DRAPE U-SHAPE 47X51 STRL (DRAPES) ×2 IMPLANT
DRESSING AQUACEL AG SP 3.5X10 (GAUZE/BANDAGES/DRESSINGS) ×1 IMPLANT
DRSG AQUACEL AG SP 3.5X10 (GAUZE/BANDAGES/DRESSINGS) ×2
DURAPREP 26ML APPLICATOR (WOUND CARE) ×4 IMPLANT
ELECT REM PT RETURN 15FT ADLT (MISCELLANEOUS) ×2 IMPLANT
FACESHIELD WRAPAROUND (MASK) ×10 IMPLANT
FEMUR SIGMA PS SZ 4.0 R (Femur) ×2 IMPLANT
GAUZE SPONGE 4X4 12PLY STRL (GAUZE/BANDAGES/DRESSINGS) IMPLANT
GAUZE XEROFORM 5X9 LF (GAUZE/BANDAGES/DRESSINGS) IMPLANT
GLOVE BIOGEL PI IND STRL 7.5 (GLOVE) ×1 IMPLANT
GLOVE BIOGEL PI INDICATOR 7.5 (GLOVE) ×1
GLOVE ORTHO TXT STRL SZ7.5 (GLOVE) ×4 IMPLANT
GOWN STRL REUS W/TWL LRG LVL3 (GOWN DISPOSABLE) ×4 IMPLANT
HANDPIECE INTERPULSE COAX TIP (DISPOSABLE) ×1
HOLDER FOLEY CATH W/STRAP (MISCELLANEOUS) ×2 IMPLANT
IMMOBILIZER KNEE 16 UNIV (MISCELLANEOUS) ×2 IMPLANT
INTRODUCER BULLET MAT STIMULAN (INTRODUCER) ×1 IMPLANT
KIT BASIN OR (CUSTOM PROCEDURE TRAY) ×2 IMPLANT
KIT STIMULAN RAPID CURE  10CC (Orthopedic Implant) ×2 IMPLANT
KIT STIMULAN RAPID CURE 10CC (Orthopedic Implant) ×2 IMPLANT
KIT TURNOVER KIT A (KITS) IMPLANT
MANIFOLD NEPTUNE II (INSTRUMENTS) ×2 IMPLANT
PACK TOTAL JOINT (CUSTOM PROCEDURE TRAY) ×2 IMPLANT
PADDING CAST COTTON 6X4 STRL (CAST SUPPLIES) IMPLANT
PLATE ROT INSERT 15MM SIZE 4 (Plate) ×2 IMPLANT
PROTECTOR NERVE ULNAR (MISCELLANEOUS) ×2 IMPLANT
SET HNDPC FAN SPRY TIP SCT (DISPOSABLE) ×1 IMPLANT
SET PAD KNEE POSITIONER (MISCELLANEOUS) ×2 IMPLANT
SPONGE LAP 18X18 RF (DISPOSABLE) IMPLANT
STAPLER VISISTAT 35W (STAPLE) IMPLANT
STIMULAN BULLET MAT INTRODUCER (INTRODUCER) ×2
SUCTION FRAZIER HANDLE 12FR (TUBING)
SUCTION TUBE FRAZIER 12FR DISP (TUBING) IMPLANT
SUT MNCRL AB 3-0 PS2 18 (SUTURE) ×2 IMPLANT
SUT STRATAFIX PDS+ 0 24IN (SUTURE) ×4 IMPLANT
SUT VIC AB 1 CT1 36 (SUTURE) ×4 IMPLANT
SUT VIC AB 2-0 CT1 27 (SUTURE) ×3
SUT VIC AB 2-0 CT1 TAPERPNT 27 (SUTURE) ×3 IMPLANT
SWAB COLLECTION DEVICE MRSA (MISCELLANEOUS) ×2 IMPLANT
SWAB CULTURE ESWAB REG 1ML (MISCELLANEOUS) ×2 IMPLANT
TOWEL OR 17X26 10 PK STRL BLUE (TOWEL DISPOSABLE) ×2 IMPLANT
TRAY FOLEY MTR SLVR 16FR STAT (SET/KITS/TRAYS/PACK) ×2 IMPLANT
WRAP KNEE MAXI GEL POST OP (GAUZE/BANDAGES/DRESSINGS) ×2 IMPLANT
YANKAUER SUCT BULB TIP 10FT TU (MISCELLANEOUS) ×2 IMPLANT
stimulan mat & introducer ×2 IMPLANT

## 2019-06-15 NOTE — Progress Notes (Signed)
Pharmacy Antibiotic Note  [name]. is a 58 y.o. male admitted on 06/15/2019 with PJI Right knee.  Pharmacy has been consulted for Vancomycin dosing. Patient with PMH MSSA R knee PJI completing Cefazolin on 05/09/2019. Noted to have increasing pain as well as increased inflammatory markers. Patient came in with infection of antibiotic spacer which was removed 10/15 and re-placed with new antibiotic spacer.  GPC cluster on gram stain from OR specimen.  Due to possibility of failed cefazolin therapy, starting vancomycin until have final culture and susceptibility results.    Plan:  Vancomycin 2gm x1 loading dose then 1gm IV q12h  Calc AUC = 467, Trough = 13 using SCr = 0.98 and Vd = 0.5 L/kg  Pending PICC placement  See OPAT note  Follow SCr and levels if remains here long enough to check  Height: 5' 9.5" (176.5 cm) Weight: 259 lb (117.5 kg) IBW/kg (Calculated) : 71.85  Temp (24hrs), Avg:98.1 F (36.7 C), Min:97.4 F (36.3 C), Max:98.6 F (37 C)  Recent Labs  Lab 06/09/19 1012  WBC 7.0  CREATININE 0.98    Estimated Creatinine Clearance: 103.4 mL/min (by C-G formula based on SCr of 0.98 mg/dL).    Allergies  Allergen Reactions  . Morphine And Related Other (See Comments)    Hallucinations    Antimicrobials this admission: 10/15 vanco >>  Dose adjustments this admission:   Microbiology results: 10/15 R knee (A): GPC clusters 10/15 R knee (B): GPC clusters  Thank you for allowing pharmacy to be a part of this patient's care.  Doreene Eland, PharmD, BCPS.   Work Cell: [phone] 06/15/2019 4:50 PM

## 2019-06-15 NOTE — Transfer of Care (Signed)
Immediate Anesthesia Transfer of Care Note  Patient: [name].  Procedure(s) Performed: Resection of antibiotic spacer and  irrigation and debridment and placement of new antibiotic spacer and components (Right Knee)  Patient Location: PACU  Anesthesia Type:Spinal  Level of Consciousness: awake, alert  and oriented  Airway & Oxygen Therapy: Patient Spontanous Breathing and Patient connected to face mask oxygen  Post-op Assessment: Report given to RN and Post -op Vital signs reviewed and stable  Post vital signs: Reviewed and stable  Last Vitals:  Vitals Value Taken Time  BP 100/80 06/15/19 1300  Temp    Pulse 46 06/15/19 1303  Resp 21 06/15/19 1303  SpO2 100 % 06/15/19 1303  Vitals shown include unvalidated device data.  Last Pain:  Vitals:   06/15/19 0812  TempSrc: Oral      Patients Stated Pain Goal: 3 (123XX123 Q000111Q)  Complications: No apparent anesthesia complications

## 2019-06-15 NOTE — Progress Notes (Signed)
AssistedDr. Miller with right, ultrasound guided, adductor canal block. Side rails up, monitors on throughout procedure. See vital signs in flow sheet. Tolerated Procedure well.  

## 2019-06-15 NOTE — Progress Notes (Signed)
PHARMACY CONSULT NOTE FOR:  OUTPATIENT  PARENTERAL ANTIBIOTIC THERAPY (OPAT)  Indication: R knee PJI Regimen: vancomycin 1gm IV q12h End date: 07/26/2019  IV antibiotic discharge orders are pended. To discharging provider:  please sign these orders via discharge navigator,  Select New Orders & click on the button choice - Manage This Unsigned Work.     Thank you for allowing pharmacy to be a part of this patient's care.  Doreene Eland, PharmD, BCPS.   Work Cell: [phone] 06/15/2019 4:56 PM

## 2019-06-15 NOTE — Progress Notes (Signed)
Peripherally Inserted Central Catheter/Midline Placement  The IV Nurse has discussed with the patient and/or persons authorized to consent for the patient, the purpose of this procedure and the potential benefits and risks involved with this procedure.  The benefits include less needle sticks, lab draws from the catheter, and the patient may be discharged home with the catheter. Risks include, but not limited to, infection, bleeding, blood clot (thrombus formation), and puncture of an artery; nerve damage and irregular heartbeat and possibility to perform a PICC exchange if needed/ordered by physician.  Alternatives to this procedure were also discussed.  Bard Power PICC patient education guide, fact sheet on infection prevention and patient information card has been provided to patient /or left at bedside.    PICC/Midline Placement Documentation  PICC Single Lumen Q000111Q PICC Right Basilic 42 cm 0 cm (Active)     PICC Single Lumen 123XX123 PICC Right Basilic 42 cm 0 cm (Active)  Indication for Insertion or Continuance of Line Home intravenous therapies (PICC only) 06/15/19 1820  Exposed Catheter (cm) 0 cm 06/15/19 1820  Site Assessment Clean;Dry;Intact 06/15/19 1820  Line Status Flushed;Blood return noted 06/15/19 1820  Dressing Type Transparent 06/15/19 1820  Dressing Status Clean;Dry;Intact;Antimicrobial disc in place;Other (Comment) 06/15/19 1820  Dressing Intervention New dressing 06/15/19 1820  Dressing Change Due 06/22/19 06/15/19 1820       Christella Noa Albarece 06/15/2019, 6:23 PM

## 2019-06-15 NOTE — Anesthesia Postprocedure Evaluation (Signed)
Anesthesia Post Note  Patient: [name].  Procedure(s) Performed: Resection of antibiotic spacer and  irrigation and debridment and placement of new antibiotic spacer and components (Right Knee)     Patient location during evaluation: PACU Anesthesia Type: Spinal Level of consciousness: oriented and awake and alert Pain management: pain level controlled Vital Signs Assessment: post-procedure vital signs reviewed and stable Respiratory status: spontaneous breathing and respiratory function stable Cardiovascular status: blood pressure returned to baseline and stable Postop Assessment: no headache, no backache and no apparent nausea or vomiting Anesthetic complications: no    Last Vitals:  Vitals:   06/15/19 1330 06/15/19 1345  BP: 100/89 114/90  Pulse: 81 61  Resp: 19 (!) 22  Temp: (!) 36.3 C   SpO2: 96% 97%    Last Pain:  Vitals:   06/15/19 1345  TempSrc:   PainSc: 0-No pain                 Lynda Rainwater

## 2019-06-15 NOTE — Interval H&P Note (Signed)
History and Physical Interval Note:  06/15/2019 9:04 AM  [name].  has presented today for surgery, with the diagnosis of Status post resection right total knee and placement of antibiotic spacer.  The various methods of treatment have been discussed with the patient and family. After consideration of risks, benefits and other options for treatment, the patient has consented to  Procedure(s) with comments: Resection of antibiotic spacer and  irrigation and debridment and placement of new antibiotic spacer (Right) - 90 mins as a surgical intervention.  The patient's history has been reviewed, patient examined, no change in status, stable for surgery.  I have reviewed the patient's chart and labs.  Questions were answered to the patient's satisfaction.     Mauri Pole

## 2019-06-15 NOTE — Anesthesia Procedure Notes (Signed)
Anesthesia Regional Block: Adductor canal block   Pre-Anesthetic Checklist: ,, timeout performed, Correct Patient, Correct Site, Correct Laterality, Correct Procedure, Correct Position, site marked, Risks and benefits discussed,  Surgical consent,  Pre-op evaluation,  At surgeon's request and post-op pain management  Laterality: Right  Prep: chloraprep       Needles:  Injection technique: Single-shot  Needle Type: Stimiplex     Needle Length: 9cm  Needle Gauge: 21     Additional Needles:   Procedures:,,,, ultrasound used (permanent image in chart),,,,  Narrative:  Start time: 06/15/2019 9:16 AM End time: 06/15/2019 9:21 AM Injection made incrementally with aspirations every 5 mL.  Performed by: Personally  Anesthesiologist: Lynda Rainwater, MD

## 2019-06-15 NOTE — Consult Note (Addendum)
[facility]       Reason for Consult: PJI    Referring Physician: Dr. Alvan Dame  Principal Problem:   S/P right TK rev Active Problems:   Status post revision of total knee, right   . chlorhexidine  60 mL Topical Once  . insulin aspart  0-15 Units Subcutaneous TID WC    Recommendations: Vancomycin Twice weekly labs picc line (ordered by Dr. Alvan Dame)  OK from ID standpoint for discharge today  Assessment: He has continued concerns for infection and now s/p resection of spacer and placement of new spacer and debridement.  History of MSSA and has been on cefazolin.  Gram stain with gram positive cocci in clusters c/w previous MSSA.  I will start vancomycin in case now MRSA and monitor culture as an outpatient.    Antibiotics: Previous cefazolin, Keflex  HPI: Corinthian Mizrahi. is a 58 y.o. 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  He completed 6 weeks of IV cefazolin 05/09/19.  He did have more pain and CRP was up but ESR wnl.  He underwent planned procedure today by Dr. Alvan Dame with consideration or reimplantation but recent MRI by report with possible osteomyelitis and abscess (report not available to me) and underwent I and D with resection of spacer and placement of a new spacer.     Review of Systems:  Constitutional: negative for fevers, chills and anorexia Gastrointestinal: negative for nausea and diarrhea Integument/breast: negative for rash All other systems reviewed and are negative    Past Medical History:  Diagnosis Date  . Arthritis   . Diabetes mellitus without complication ([place])    TYPE 2  . Diverticulosis   . Dyslipidemia   . GERD (gastroesophageal reflux disease)   . Herpes labialis   . Hypertension   . Longstanding persistent atrial fibrillation ([place])   . Metabolic syndrome   . Obesity    . Proteinuria   . Psoriasis   . Seborrheic dermatitis   . Sleep apnea    very compliant with CPAP, PT NEEDS TO BRING OWN MACHINE  . TIA (transient ischemic attack) 2009    Social History   Tobacco Use  . Smoking status: Never Smoker  . Smokeless tobacco: Never Used  Substance Use Topics  . Alcohol use: Yes    Comment: 2/month  . Drug use: No    Family History  Problem Relation Age of Onset  . Uterine cancer Mother 54       Cervical cath  . Crohn's disease Mother   . Esophageal cancer Father   . Cancer Other   . Obesity Other     Allergies  Allergen Reactions  . Morphine And Related Other (See Comments)    Hallucinations    Physical Exam: Constitutional: in no apparent distress  Vitals:   06/15/19 1400 06/15/19 1500  BP: 114/83 133/79  Pulse: 61 75  Resp: (!) 22 18  Temp:  98.2 F (36.8 C)  SpO2: 99% 95%   EYES: anicteric ENMT: no thrush Cardiovascular: Cor RRR Respiratory: CTA B; normal respiratory effort GI: soft Musculoskeletal: no edema Skin: negatives: no rash Neuro: non-focal  Lab Results  Component Value Date   WBC 7.0 06/09/2019   HGB 18.1 (H) 06/09/2019   HCT 52.9 (H) 06/09/2019   MCV 88.0 06/09/2019   PLT 229 06/09/2019  Lab Results  Component Value Date   CREATININE 0.98 06/09/2019   BUN 18 06/09/2019   NA 138 06/09/2019   K 4.3 06/09/2019   CL 105 06/09/2019   CO2 22 06/09/2019    Lab Results  Component Value Date   ALT 43 03/23/2019   AST 24 03/23/2019   ALKPHOS 52 03/23/2019     Microbiology: Recent Results (from the past 240 hour(s))  Surgical pcr screen     Status: None   Collection Time: 06/09/19  9:07 AM   Specimen: Nasal Mucosa; Nasal Swab  Result Value Ref Range Status   MRSA, PCR NEGATIVE NEGATIVE Final   Staphylococcus aureus NEGATIVE NEGATIVE Final    Comment: (NOTE) The Xpert SA Assay (FDA approved for NASAL specimens in patients 64 years of age and older), is one component of a comprehensive  surveillance program. It is not intended to diagnose infection nor to guide or monitor treatment. Performed at [facility], [place] [address] [place], [name] [address]   SARS CORONAVIRUS 2 (TAT 6-24 HRS) Nasopharyngeal Nasopharyngeal Swab     Status: None   Collection Time: 06/12/19 12:35 PM   Specimen: Nasopharyngeal Swab  Result Value Ref Range Status   SARS Coronavirus 2 NEGATIVE NEGATIVE Final    Comment: (NOTE) SARS-CoV-2 target nucleic acids are NOT DETECTED. The SARS-CoV-2 RNA is generally detectable in upper and lower respiratory specimens during the acute phase of infection. Negative results do not preclude SARS-CoV-2 infection, do not rule out co-infections with other pathogens, and should not be used as the sole basis for treatment or other patient management decisions. Negative results must be combined with clinical observations, patient history, and epidemiological information. The expected result is Negative. Fact Sheet for Patients: SugarRoll.be Fact Sheet for Healthcare Providers: https://www.woods-mathews.com/ This test is not yet approved or cleared by the [place] FDA and  has been authorized for detection and/or diagnosis of SARS-CoV-2 by FDA under an Emergency Use Authorization (EUA). This EUA will remain  in effect (meaning this test can be used) for the duration of the COVID-19 declaration under Section 56 4(b)(1) of the Act, 21 U.S.C. section 360bbb-3(b)(1), unless the authorization is terminated or revoked sooner. Performed at [facility], [place] [address], [place], [place] [address]   Body fluid culture     Status: None (Preliminary result)   Collection Time: 06/15/19 10:48 AM   Specimen: Synovium  Result Value Ref Range Status   Specimen Description   Final    SYNOVIAL RT KNEE SPECMEN A Performed at [place] [address] [place], [place] [address]     Special Requests   Final    NONE Performed at [facility], [place] [address], [place], [place] [address]    Gram Stain   Final    ABUNDANT WBC PRESENT,BOTH PMN AND MONONUCLEAR MODERATE GRAM POSITIVE COCCI IN CLUSTERS Performed at [facility], [place] [address], [place], [place] [address]    Culture PENDING  Incomplete   Report Status PENDING  Incomplete  Body fluid culture     Status: None (Preliminary result)   Collection Time: 06/15/19 10:50 AM   Specimen: Synovium  Result Value Ref Range Status   Specimen Description   Final    SYNOVIAL RT KNEE SPECIMEN B Performed at [facility] [address], [place], [place] [address]    Special Requests   Final    NONE Performed at [facility],  [place] [address], [place], [place] [address]    Gram Stain   Final    MODERATE WBC PRESENT, PREDOMINANTLY PMN FEW GRAM POSITIVE COCCI IN CLUSTERS Performed at [facility], [place] [address] [place], [place] [address]    Culture PENDING  Incomplete   Report Status PENDING  Incomplete    Thayer Headings, [place] for Infectious Disease [facility] [email or web address] 06/15/2019, 3:50 PM

## 2019-06-15 NOTE — Anesthesia Procedure Notes (Signed)
Spinal  Patient location during procedure: OR Start time: 06/15/2019 10:11 AM Staffing Resident/CRNA: [place] Quanesha Klimaszewski C, CRNA Performed: resident/CRNA  Preanesthetic Checklist Completed: patient identified, site marked, surgical consent, pre-op evaluation, timeout performed, IV checked, risks and benefits discussed and monitors and equipment checked Spinal Block Patient position: sitting Prep: site prepped and draped and DuraPrep Patient monitoring: heart rate, cardiac monitor, continuous pulse ox and blood pressure Approach: midline Location: L3-4 Injection technique: single-shot Needle Needle type: Pencan  Needle gauge: 24 G Needle length: 9 cm Assessment Sensory level: T4 Additional Notes IV functioning, monitors applied to pt. Expiration date of kit checked and confirmed to be in date. Sterile prep and drape, hand hygiene and sterile gloved used. Pt was positioned and spine was prepped in sterile fashion. Skin was anesthetized with lidocaine. Free flow of clear CSF obtained prior to injecting local anesthetic into CSF x 1 attempt. Spinal needle aspirated freely following injection. Needle was carefully withdrawn, and pt tolerated procedure well. Loss of motor and sensory on exam post injection.

## 2019-06-15 NOTE — Discharge Instructions (Signed)

## 2019-06-16 ENCOUNTER — Encounter (HOSPITAL_COMMUNITY): Payer: Self-pay | Admitting: Orthopedic Surgery

## 2019-06-16 DIAGNOSIS — T8459XA Infection and inflammatory reaction due to other internal joint prosthesis, initial encounter: Secondary | ICD-10-CM | POA: Diagnosis not present

## 2019-06-16 DIAGNOSIS — A4901 Methicillin susceptible Staphylococcus aureus infection, unspecified site: Secondary | ICD-10-CM | POA: Diagnosis not present

## 2019-06-16 LAB — CBC
HCT: 43.9 % (ref 39.0–52.0)
Hemoglobin: 15.1 g/dL (ref 13.0–17.0)
MCH: 29.8 pg (ref 26.0–34.0)
MCHC: 34.4 g/dL (ref 30.0–36.0)
MCV: 86.6 fL (ref 80.0–100.0)
Platelets: 215 10*3/uL (ref 150–400)
RBC: 5.07 MIL/uL (ref 4.22–5.81)
RDW: 13.4 % (ref 11.5–15.5)
WBC: 9.1 10*3/uL (ref 4.0–10.5)
nRBC: 0 % (ref 0.0–0.2)

## 2019-06-16 LAB — BASIC METABOLIC PANEL
Anion gap: 11 (ref 5–15)
BUN: 17 mg/dL (ref 6–20)
CO2: 22 mmol/L (ref 22–32)
Calcium: 8.8 mg/dL — ABNORMAL LOW (ref 8.9–10.3)
Chloride: 101 mmol/L (ref 98–111)
Creatinine, Ser: 0.98 mg/dL (ref 0.61–1.24)
GFR calc Af Amer: >60 mL/min (ref >60)
GFR calc non Af Amer: >60 mL/min (ref >60)
Glucose, Bld: 169 mg/dL — ABNORMAL HIGH (ref 70–99)
Potassium: 3.7 mmol/L (ref 3.5–5.1)
Sodium: 134 mmol/L — ABNORMAL LOW (ref 135–145)

## 2019-06-16 LAB — GLUCOSE, CAPILLARY
Glucose-Capillary: 209 mg/dL — ABNORMAL HIGH (ref 70–99)
Glucose-Capillary: 173 mg/dL — ABNORMAL HIGH (ref 70–99)

## 2019-06-16 LAB — SURGICAL PATHOLOGY

## 2019-06-16 MED ORDER — VANCOMYCIN IV (FOR PTA / DISCHARGE USE ONLY): 1000.0000 mg | Freq: Two times a day (BID) | INTRAVENOUS | 0 refills | Status: DC

## 2019-06-16 MED ORDER — HYDROCODONE-ACETAMINOPHEN 7.5-325 MG PO TABS: 1.0000 | ORAL_TABLET | ORAL | 0 refills | Status: DC | PRN

## 2019-06-16 MED ORDER — HEPARIN SOD (PORK) LOCK FLUSH 100 UNIT/ML IV SOLN
250.0000 [IU] | INTRAVENOUS | Status: AC | PRN
  Administered 2019-06-16: 250 [IU]
  Filled 2019-06-16: qty 2.5

## 2019-06-16 MED ORDER — METHOCARBAMOL 500 MG PO TABS: 500.0000 mg | ORAL_TABLET | Freq: Four times a day (QID) | ORAL | 0 refills | Status: DC | PRN

## 2019-06-16 MED ORDER — DOCUSATE SODIUM 100 MG PO CAPS: 100.0000 mg | ORAL_CAPSULE | Freq: Two times a day (BID) | ORAL | 0 refills | Status: DC

## 2019-06-16 MED ORDER — POLYETHYLENE GLYCOL 3350 17 G PO PACK: 17.0000 g | PACK | Freq: Two times a day (BID) | ORAL | 0 refills | Status: DC

## 2019-06-16 MED ORDER — FERROUS SULFATE 325 (65 FE) MG PO TABS: 325.0000 mg | ORAL_TABLET | Freq: Three times a day (TID) | ORAL | 0 refills | Status: DC

## 2019-06-16 MED FILL — HYDROCODON-APAP 7.5-325: 7.5-325 | 5 days supply | Qty: 60 | Fill #0

## 2019-06-16 NOTE — Consult Note (Signed)
   [facility] CM Inpatient Consult   06/16/2019  Antion Crogan. Jan 21, 1961 [id number]   THN status:  Pending follow up [facility] member insurance plan  Patient was assessed for [facility] for community services. Patient was previously outreached by [facility].    Patient will be followed by a [place] Coordinator for post hospital follow up and needs.    Of note, [facility] Care Management services does not replace or interfere with any services that are arranged by inpatient [facility] care management team.   For additional questions or referrals please contact:   Natividad Brood, RN BSN [facility] Liaison  [phone] business mobile phone Toll free office [phone]  Fax number: [phone] [email or web address]

## 2019-06-16 NOTE — Evaluation (Signed)
Physical Therapy Evaluation Patient Details Name: [name]. MRN: [id number] DOB: [birth year] Today's Date: 06/16/2019   History of Present Illness  Pt s/p removal and replacement of antibiotic spacer.  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  Pt able to demonstrate MOd I in mobility with only minor cueing for safety.  Pt states he is comfortable with following through with ongoing home therex program - not attempted this am 2* pain level.  Pt states comfortable with negotiating stairs but did climb single step bkwd with RW 2* limited WB tolerance.  Pt eager for dc home this date.    Follow Up Recommendations No PT follow up    Equipment Recommendations  None recommended by PT    Recommendations for Other Services       Precautions / Restrictions Precautions Precautions: Knee;Fall Restrictions Weight Bearing Restrictions: No      Mobility  Bed Mobility Overal bed mobility: Modified Independent             General bed mobility comments: Pt utilizing belt to self assist R LE  Transfers Overall transfer level: Needs assistance Equipment used: Rolling walker (2 wheeled) Transfers: Sit to/from Stand Sit to Stand: Supervision         General transfer comment: cues for use of UEs to self assist  Ambulation/Gait Ambulation/Gait assistance: Min guard;Supervision Gait Distance (Feet): 135 Feet Assistive device: Rolling walker (2 wheeled) Gait Pattern/deviations: Step-to pattern;Step-through pattern;Decreased step length - right;Decreased step length - left;Shuffle;Trunk flexed Gait velocity: decr   General Gait Details: min cues for posture and position from RW  Stairs Stairs: Yes Stairs assistance: Min assist Stair Management: No rails;Step to pattern;Backwards;With walker Number of  Stairs: 1 General stair comments: Single step bkwd 2* absence of rail and pt with ltd WB tolerance this am  Wheelchair Mobility    Modified Rankin (Stroke Patients Only)       Balance Overall balance assessment: Mild deficits observed, not formally tested                                           Pertinent Vitals/Pain Pain Assessment: 0-10 Pain Score: 7  Pain Location: R knee Pain Descriptors / Indicators: Sore;Aching Pain Intervention(s): Limited activity within patient's tolerance;Monitored during session;Premedicated before session;Ice applied    Home Living Family/patient expects to be discharged to:: Private residence Living Arrangements: Spouse/significant other Available Help at Discharge: Family Type of Home: House Home Access: Stairs to enter   Technical brewer of Steps: 5+1 Home Layout: Two level Home Equipment: Environmental consultant - 2 wheels;Crutches      Prior Function Level of Independence: Independent with assistive device(s)         Comments: pt using single crutch at home for mobility     Hand Dominance   Dominant Hand: Right    Extremity/Trunk Assessment   Upper Extremity Assessment Upper Extremity Assessment: Overall WFL for tasks assessed    Lower Extremity Assessment Lower Extremity Assessment: RLE deficits/detail    Cervical / Trunk Assessment Cervical / Trunk Assessment: Normal  Communication   Communication: No difficulties  Cognition Arousal/Alertness: Awake/alert Behavior During Therapy: WFL for tasks assessed/performed Overall Cognitive Status: Within Functional Limits for tasks  assessed                                        General Comments      Exercises     Assessment/Plan    PT Assessment [place] does not need any further PT services  PT Problem List Decreased strength;Decreased range of motion;Decreased activity tolerance;Pain;Decreased knowledge of use of DME       PT Treatment  Interventions DME instruction;Gait training;Stair training;Functional mobility training;Therapeutic activities;Therapeutic exercise;Patient/family education    PT Goals (Current goals can be found in the Care Plan section)  Acute Rehab PT Goals Patient Stated Goal: Get a new knee in January PT Goal Formulation: All assessment and education complete, DC therapy    Frequency Min 1X/week   Barriers to discharge        Co-evaluation               AM-PAC PT "6 Clicks" Mobility  Outcome Measure Help needed turning from your back to your side while in a flat bed without using bedrails?: None Help needed moving from lying on your back to sitting on the side of a flat bed without using bedrails?: None Help needed moving to and from a bed to a chair (including a wheelchair)?: A Little Help needed standing up from a chair using your arms (e.g., wheelchair or bedside chair)?: None Help needed to walk in hospital room?: A Little Help needed climbing 3-5 steps with a railing? : A Little 6 Click Score: 21    End of Session Equipment Utilized During Treatment: Gait belt Activity Tolerance: Patient tolerated treatment well Patient left: in bed;with call bell/phone within reach;with family/visitor present Nurse Communication: Mobility status PT Visit Diagnosis: Difficulty in walking, not elsewhere classified (R26.2)    Time: [id number] PT Time Calculation (min) (ACUTE ONLY): 30 min   Charges:   PT Evaluation $PT Eval Low Complexity: 1 Low          [place] Pager [phone] Office [phone]   Paton Crum 06/16/2019, 10:27 AM

## 2019-06-16 NOTE — Progress Notes (Signed)
     Subjective: 1 Day Post-Op Procedure(s) (LRB): Resection of antibiotic spacer and  irrigation and debridment and placement of new antibiotic spacer and components (Right)   Patient reports pain as mild, pain controlled.  No events throughout the night.  Dr. Alvan Dame saw the patient discussed the procedure, findings and expectations moving forward.  Patient is already received his PICC line.  Patient can be discharged home today, if he does well therapy and final recs from ID.  Patient follow-up in the clinic in 2 weeks.  Patient knows to call with any questions or concerns.   Objective:   VITALS:   Vitals:   06/16/19 0103 06/16/19 0431  BP: (!) 156/96 137/81  Pulse: 84 60  Resp: 16 16  Temp: 98 F (36.7 C) 98.3 F (36.8 C)  SpO2: 95% 94%    Dorsiflexion/Plantar flexion intact Incision: dressing C/D/I No cellulitis present Compartment soft  LABS Recent Labs    06/16/19 0326  HGB 15.1  HCT 43.9  WBC 9.1  PLT 215    Recent Labs    06/16/19 0326  NA 134*  K 3.7  BUN 17  CREATININE 0.98  GLUCOSE 169*     Assessment/Plan: 1 Day Post-Op Procedure(s) (LRB): Resection of antibiotic spacer and  irrigation and debridment and placement of new antibiotic spacer and components (Right) Foley cath d/c'ed Advance diet Up with therapy D/C IV fluids Discharge home with home health  Follow [facility] 2 [facility] at [facility] Follow up with OLIN,Daena Alper D in 2 weeks.  Contact information:  [facility] [address] [place] [phone]      Obese (BMI 30-39.9) Estimated body mass index is 37.7 kg/m as calculated from the following:   Height as of this encounter: 5' 9.5" (1.765 m).   Weight as of this encounter: 117.5 kg. Patient also counseled that weight may inhibit the healing process Patient counseled that losing weight will help with future health issues       [place]. Keishawn Rajewski   PAC  06/16/2019, 8:08 AM

## 2019-06-16 NOTE — Progress Notes (Signed)
Pt was provided with d/c instructions. Upon discussing the pt's plan of care upon d/c home, the pt reported no further questions or concerns.

## 2019-06-16 NOTE — Plan of Care (Signed)
  Problem: Education: Goal: Knowledge of General Education information will improve Description: Including pain rating scale, medication(s)/side effects and non-pharmacologic comfort measures Outcome: Progressing   Problem: Clinical Measurements: Goal: Respiratory complications will improve Outcome: Progressing Goal: Cardiovascular complication will be avoided Outcome: Progressing   Problem: Coping: Goal: Level of anxiety will decrease Outcome: Progressing   Problem: Pain Managment: Goal: General experience of comfort will improve Outcome: Progressing   Problem: Safety: Goal: Ability to remain free from injury will improve Outcome: Progressing   

## 2019-06-16 NOTE — Brief Op Note (Signed)
06/15/2019  12:00PM  PATIENT:  [name].  58 y.o. male  PRE-OPERATIVE DIAGNOSIS:  Status post resection right total knee and placement of antibiotic spacer  POST-OPERATIVE DIAGNOSIS:  Status post resection right total knee and placement of antibiotic spacer  PROCEDURE:  Procedure(s) with comments: Resection of antibiotic spacer and  irrigation and debridment and placement of new antibiotic spacer and components (Right) - 90 mins  SURGEON:  Surgeon(s) and Role:    [name], MD - Primary  PHYSICIAN ASSISTANT: Griffith Citron, PA-C  ANESTHESIA:   regional and spinal  EBL:  200 mL   BLOOD ADMINISTERED:none  DRAINS: none   LOCAL MEDICATIONS USED:  NONE  SPECIMEN:  Source of Specimen:  right knee synovial fluid and tissue  DISPOSITION OF SPECIMEN:  PATHOLOGY  COUNTS:  YES  TOURNIQUET:   Total Tourniquet Time Documented: Thigh (Right) - 72 minutes Total: Thigh (Right) - 72 minutes   DICTATION: .Other Dictation: Dictation Number [id number]  PLAN OF CARE: Admit to inpatient   PATIENT DISPOSITION:  PACU - hemodynamically stable.   Delay start of Pharmacological VTE agent (>24hrs) due to surgical blood loss or risk of bleeding: no

## 2019-06-16 NOTE — Op Note (Signed)
NAME: [name], [name] MEDICAL RECORD [id number] ACCOUNT [id number] DATE OF BIRTH:[birth year] FACILITY: [facility] LOCATION: [facility] PHYSICIAN:Shereen Marton DAlvan Dame, MD  OPERATIVE REPORT  DATE OF PROCEDURE:  06/15/2019  PREOPERATIVE DIAGNOSES:  Recurrent/persistent infection, right total knee replacement, and history of resection of his total knee arthroplasty with placement of antibiotic spacer, articulating.  POSTOPERATIVE DIAGNOSES:   Recurrent/persistent infection, right total knee replacement, and history of resection of his total knee arthroplasty with placement of antibiotic spacer, articulating.  PROCEDURE:  Repeat excisional and nonexcisional debridement of right knee with removal of all previously placed implants and placement of new antibiotic articulating spacer.  SURGEON:  [name], MD  ASSISTANT:  Griffith Citron, PA-C  ANESTHESIA:  Regional plus spinal.  SPECIMENS:  Right knee synovial fluid and synovial tissue were sent to pathology for evaluation, Gram stain and culture.  ESTIMATED BLOOD LOSS:  Maybe 200 mL.  TOURNIQUET:  Up for 72 minutes at 250 mmHg.  COMPLICATIONS:  None apparent.  INDICATIONS FOR PROCEDURE:  The patient is a very pleasant 58 year old male with history of bilateral total hip replacements.  He does have a history of diabetes, but it is well controlled with A1c level under 7.  His right total hip replacement was  performed first.  Approximately a year out from his surgery, he had acute onset of infection.  Based on the acuity and his overall recovery and no complications, it was felt to be an acute infection and treated as such.  Then, we underwent an I and D,  poly exchange, and antibiotics.  He underwent 6 months of oral antibiotics.  Upon completion of these antibiotics, within 2 weeks he had recurrence of infection.  At that point, he had his implants removed.  We removed all of his implants, and he went  through another 6 weeks of IV antibiotics.   We are monitoring his wound, his knee, and lab work.  The prior 2 times that his knee became infected, he recognized this systemically with large fevers and chills.  The second time around, he was feeling  better and was hoping to have his knee reimplanted.  However, there was a bump in his sedimentation rate, as well as concerning changes to his knee on appearance.  I was unable to aspirate his knee in the office just due to this significant scarring  inside the knee, which was anticipated somewhat.  At this point, I had a lengthy discussion with him and his wife regarding management of his knee.  I was very concerned about the potential recurrence of infection despite the fact that he had a  streptococcus infection in the past.  An MRI was ordered that revealed concerns for a breach into the quadriceps extensor mechanism with abscess formation, as well as concern for osteomyelitis on the distal femur and proximal tibia.  For this reason, I  discussed with him prior to surgery the need for repeat excisional debridement, as well as placement of antibiotic spacer.  Though he is reluctant to do a degree based on what he has gone through with this right knee, he understood and consented for this  procedure.  Consent was obtained for management of infection.  PROCEDURE IN DETAIL:  The patient was brought to the operative theater.  Once adequate anesthesia and preoperative antibiotics initially held, we positioned him with a right thigh tourniquet placed.  The right lower extremity was prepped and draped in  sterile fashion.  Timeout was performed, identifying the patient, the planned procedure, and  extremity.  His old incision was then marked on the skin and excised.  The proximal aspect of the incision was the area of worry and concern of a potential  abscess.  Soft tissue dissection was carried down through the subcutaneous fat.  Here, we encountered the initial blush of infection with purulent-appearing  seroma.  Culture swabs were initially taken.  The knee was then further exposed through a medial  arthrotomy and removed 8 mL of the same synovial fluid to send to pathology for evaluation.  At this point, the knee was exposed with moderate effort.  There was an extensive amount of excisional debridement of scar and nonviable tissue in the medial and  lateral aspect of the joint, as well as the suprapatellar pouch.  Some of this tissue was sent to pathology for evaluation.  I removed the patella button and protected the patella.  Following this exposure, I was able to remove the femoral and tibial  components previously placed as well as the cement without complication.  Once all the components were removed, we irrigated the knee with 3 L of normal saline solution within the canals and into the joint itself.  I then placed 500 mL of Irrisept  antimicrobial fluid in the joint for 5 minutes.  While this was going on, we mixed Stimulan antibiotic delivery beads with 1 g of vancomycin and 1.2 of tobramycin.  Once these were set and the Irrisept fluid was removed from the knee, these beads were placed into the femoral canal and tibial canal.   Additionally, we opened up a size 4 femoral component and a 4 x 15 mm posterior stabilized insert and mixed 3 batches of cement with 5 g of vancomycin and 6 g of tobramycin.  These components were then cemented onto the end of the femur and top of the  tibia, and the knee was brought to extension to allow the cement to cure.  Once the cement cured, we reirrigated the knee with 3 L of normal saline solution.  I was able to reapproximate the extensor mechanism, even the proximal aspect with #1 Stratafix  suture reinforcing this.  Once the extensor mechanism was closed, the remaining wound was closed with 2-0 Vicryl in a running Monocryl stitch.  The knee was then cleaned, dried, and dressed sterilely using surgical glue and an Aquacel dressing.  He was  brought to the  recovery room in stable condition.  Findings were reviewed with his wife.  Infectious disease consulted.  A PICC line order placed.  We will work on discharge once we are able to identify appropriate antibiotics for discharge.  LN/NUANCE  D:06/16/2019 T:06/16/2019 JOB:[id number]

## 2019-06-17 LAB — BODY FLUID CULTURE

## 2019-06-19 ENCOUNTER — Other Ambulatory Visit: Payer: Self-pay | Admitting: *Deleted

## 2019-06-19 ENCOUNTER — Other Ambulatory Visit: Payer: Self-pay | Admitting: Internal Medicine

## 2019-06-19 ENCOUNTER — Telehealth: Payer: Self-pay | Admitting: Internal Medicine

## 2019-06-19 DIAGNOSIS — Z96659 Presence of unspecified artificial knee joint: Secondary | ICD-10-CM

## 2019-06-19 DIAGNOSIS — A4901 Methicillin susceptible Staphylococcus aureus infection, unspecified site: Secondary | ICD-10-CM | POA: Diagnosis not present

## 2019-06-19 DIAGNOSIS — T8459XA Infection and inflammatory reaction due to other internal joint prosthesis, initial encounter: Secondary | ICD-10-CM | POA: Diagnosis not present

## 2019-06-19 DIAGNOSIS — T8459XD Infection and inflammatory reaction due to other internal joint prosthesis, subsequent encounter: Secondary | ICD-10-CM

## 2019-06-19 MED ORDER — VANCOMYCIN IV (FOR PTA / DISCHARGE USE ONLY): 1000.0000 mg | Freq: Two times a day (BID) | INTRAVENOUS | Status: AC

## 2019-06-19 MED ORDER — SODIUM CHLORIDE 0.9 % IV SOLN: 8.0000 mg/kg | Freq: Every day | INTRAVENOUS | Status: AC

## 2019-06-19 NOTE — Patient Outreach (Signed)
[facility]) Care Management  06/19/2019  Cortrell Angelo. 07-10-1961 [id number]   Transition of care call/case closure   Referral received: 06/08/19 Initial outreach: 06/19/19 Insurance: [place] Choice Plan   Subjective: Initial successful telephone call to patient's preferred number in order to complete transition of care assessment; 2 HIPAA identifiers verified. Explained purpose of call and completed transition of care assessment.  [name] states he is doing well, denies post-operative problems, says surgical incisions are unremarkable, states surgical pain well managed with over the counter medications, tolerating diet, denies bowel or bladder problems.  Spouse is assisting with his recovery.  He says he is aware his IV antibiotic therapy has been changed from Vancomycin to daptomycin as he has spoken to Dr. Megan Salon, his infectious disease provider. [name] states he remains an active member of the [employer] for Type 2 DM self management assistance. He says his fating blood sugars since discharge from the hospital have been 150-175 but his blood sugars throughout the day are considerably lower. He denies hypoglycemia.  He says he uses a Ambulance person.  He denies educational needs related to staying safe during the COVID 19 pandemic.    Objective:  Kender Hanby was hospitalized at [facility] from 10/15-10/16/2020 for resection of antibiotic spacer , incision and drainage and replacement of right knee antibiotic spacer to treat chronic infection of right knee.  Comorbidities include: HTN, atrial fibrillation, OSA- CPAP compliant, GERD, Type 2 DM with most recent Hgb A1C= 6.1% on 06/09/19 , Hyperlipidemia, psoriatic arthritis, TIA, GERD and obesity He was discharged to home on 06/16/19 with [facility] providing nursing assistance to reinforce teaching with patient and family and provide supervision  in  administering  IV antibiotics for a total of 6 weeks.   Assessment:  Patient voices good understanding of all discharge instructions.  See transition of care flowsheet for assessment details.   Plan:  Reviewed hospital discharge diagnosis of removal of old and insertion of new antibiotic spacer of right knee  and discharge treatment plan using hospital discharge instructions, assessing medication adherence, reviewing problems requiring provider notification, and discussing the importance of follow up with surgeon and infection disease specialist as directed. No ongoing care management needs identified so will close case to [facility] Management services and route successful outreach letter with [place] Management pamphlet and 24 Hour Nurse Line Magnet to [facility] [facility] clinical pool to be mailed to patient's home address. Thanked patient for their services to [facility].  Barrington Ellison RN,CCM,CDE [facility] Management Coordinator Office Phone [phone] Office Fax [phone]

## 2019-06-19 NOTE — Telephone Encounter (Signed)
[name] was hospitalized again last week for his persistent MSSA right prosthetic knee infection.  He underwent debridement and had his antibiotic impregnated spacer replaced.  Operative cultures grew MSSA again.  He had been discharged on IV vancomycin before cultures were finalized.  I have given orders to [facility] to change him to daptomycin 940 mg IV daily for 6 weeks through 07/26/2019.  He will follow-up with me in 1 month.

## 2019-06-20 ENCOUNTER — Ambulatory Visit: Payer: 59 | Admitting: Internal Medicine

## 2019-06-20 ENCOUNTER — Encounter: Payer: Self-pay | Admitting: *Deleted

## 2019-06-20 DIAGNOSIS — T8459XA Infection and inflammatory reaction due to other internal joint prosthesis, initial encounter: Secondary | ICD-10-CM | POA: Diagnosis not present

## 2019-06-20 DIAGNOSIS — A4901 Methicillin susceptible Staphylococcus aureus infection, unspecified site: Secondary | ICD-10-CM | POA: Diagnosis not present

## 2019-06-20 LAB — ANAEROBIC CULTURE

## 2019-06-20 NOTE — Discharge Summary (Signed)
Physician Discharge Summary  Patient ID: [name]. MRN: [id number] DOB/AGE: 09-19-1960 58 y.o.  Admit date: 06/15/2019 Discharge date: 06/16/2019   Procedures:  Procedure(s) (LRB): Resection of antibiotic spacer and  irrigation and debridment and placement of new antibiotic spacer and components (Right)  Attending Physician:  Dr. 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 Arthritis   . Diabetes mellitus without complication ([place])    TYPE 2  . Diverticulosis   . Dyslipidemia   . GERD (gastroesophageal reflux disease)   . Herpes labialis   . Hypertension   . Longstanding persistent atrial fibrillation ([place])   . Metabolic syndrome   . Obesity   . Proteinuria   . Psoriasis   . Seborrheic dermatitis   . Sleep apnea    very compliant with CPAP, PT NEEDS TO BRING OWN MACHINE  . TIA (transient ischemic attack) 2009    HPI:    Pt is a 58 y.o. male  s/p right knee resection due to infection and feels that he is doing well. Has been on antibiotics for 6 weeks and stopped them recently.  Recent labs have been trending in a good direction.  Patient is anxitious to get a real TKA in place.  Various options are discussed with the patient. Risks, benefits and expectations were discussed with the patient. Patient understand the risks, benefits and expectations and wishes to proceed with surgery.   PCP: Denita Lung, MD   Discharged Condition: good  Hospital Course:  Patient underwent the above stated procedure on 06/15/2019. Patient tolerated the procedure well and brought to the recovery room in good condition and subsequently to the floor.  POD #1 BP: 137/81 ; Pulse: 60 ; Temp: 98.3 F (36.8 C) ; Resp: 16 Patient reports pain as mild, pain controlled.  No events  throughout the night.  Dr. Alvan Dame saw the patient discussed the procedure, findings and expectations moving forward.  Patient is already received his PICC line.  Patient can be discharged home. Dorsiflexion/plantar flexion intact, incision: dressing C/D/I, no cellulitis present and compartment soft.   LABS  Basename    HGB     15.1  HCT     43.9    Discharge Exam: General appearance: alert, cooperative and no distress Extremities: Homans sign is negative, no sign of DVT, no edema, redness or tenderness in the calves or thighs and no ulcers, gangrene or trophic changes  Disposition:  Home with follow up in 2 weeks   Follow-up Information    [name], MD. Schedule an appointment as soon as possible for a visit in 2 weeks.   Specialty: Orthopedic Surgery Contact information: [address] [place] [address] [id number]           Discharge Instructions    Call MD / Call 911   Complete by: As directed    If you experience chest pain or shortness of breath, CALL 911 and be transported to the hospital emergency room.  If you develope a fever above 101 F, pus (white drainage) or increased drainage or redness at the wound, or calf pain, call your surgeon's office.   Change dressing   Complete by: As directed    Maintain surgical dressing until follow up in the  clinic. If the edges start to pull up, may reinforce with tape. If the dressing is no longer working, may remove and cover with gauze and tape, but must keep the area dry and clean.  Call with any questions or concerns.   Constipation Prevention   Complete by: As directed    Drink plenty of fluids.  Prune juice may be helpful.  You may use a stool softener, such as Colace (over the counter) 100 mg twice a day.  Use MiraLax (over the counter) for constipation as needed.   Diet - low sodium heart healthy   Complete by: As directed    Discharge instructions   Complete by: As directed    Maintain surgical  dressing until follow up in the clinic. If the edges start to pull up, may reinforce with tape. If the dressing is no longer working, may remove and cover with gauze and tape, but must keep the area dry and clean.  Follow up in 2 weeks at [facility]. Call with any questions or concerns.   Home infusion instructions [facility] May follow [place] Dosing Protocol; May administer Cathflo as needed to maintain patency of vascular access device.; Flushing of vascular access device: per [facility] Protocol: 0.9% NaCl pre/post medica...   Complete by: As directed    Instructions: May follow [place] Dosing Protocol   Instructions: May administer Cathflo as needed to maintain patency of vascular access device.   Instructions: Flushing of vascular access device: per [facility] Protocol: 0.9% NaCl pre/post medication administration and prn patency; Heparin 100 u/ml, 16ml for implanted ports and Heparin 10u/ml, 12ml for all other central venous catheters.   Instructions: May follow [facility] Anaphylaxis Protocol for First Dose Administration in the home: 0.9% NaCl at 25-50 ml/hr to maintain IV access for protocol meds. Epinephrine 0.3 ml IV/IM PRN and Benadryl 25-50 IV/IM PRN s/s of anaphylaxis.   Instructions: [facility] Infusion Coordinator (RN) to assist per patient IV care needs in the home PRN.   Partial weight bearing   Complete by: As directed    % Body Weight: 50   Laterality: right   Extremity: Lower   TED hose   Complete by: As directed    Use stockings (TED hose) for 2 weeks on both leg(s).  You may remove them at night for sleeping.      Allergies as of 06/16/2019      Reactions   Morphine And Related Other (See Comments)   Hallucinations      Medication List    TAKE these medications   Accu-Chek FastClix Lancets Misc CHECK BLOOD SUGAR TWICE A DAY   carvedilol 25 MG tablet Commonly known as: COREG Take 25 mg by mouth 2 (two) times daily.   docusate sodium 100 MG  capsule Commonly known as: Colace Take 1 capsule (100 mg total) by mouth 2 (two) times daily.   Entresto 24-26 MG Generic drug: sacubitril-valsartan Take 1 tablet by mouth 2 (two) times daily.   ferrous sulfate 325 (65 FE) MG tablet Commonly known as: FerrouSul Take 1 tablet (325 mg total) by mouth 3 (three) times daily with meals for 14 days.   glucose blood test strip Commonly known as: Accu-Chek Guide USE TO TEST BLOOD GLUCOSE TWICE DAILY   HYDROcodone-acetaminophen 7.5-325 MG tablet Commonly known as: Norco Take 1-2 tablets by mouth every 4 (four) hours as needed for moderate pain.   Ixekizumab 80 MG/ML Soaj Commonly known as: Taltz Inject 80 mg into  the skin every 30 (thirty) days.   Jardiance 10 MG Tabs tablet Generic drug: empagliflozin TAKE 1 TABLET BY MOUTH ONCE DAILY What changed: how much to take   methocarbamol 500 MG tablet Commonly known as: Robaxin Take 1 tablet (500 mg total) by mouth every 6 (six) hours as needed for muscle spasms. What changed: when to take this   multivitamin with minerals Tabs tablet Take 1 tablet by mouth daily.   oxymetazoline 0.05 % nasal spray Commonly known as: AFRIN Place 1 spray into both nostrils at bedtime.   pantoprazole 40 MG tablet Commonly known as: PROTONIX Take 1 tablet (40 mg total) by mouth 2 (two) times daily.   polyethylene glycol 17 g packet Commonly known as: MIRALAX / GLYCOLAX Take 17 g by mouth 2 (two) times daily.   rosuvastatin 10 MG tablet Commonly known as: CRESTOR Take 1 tablet (10 mg total) by mouth daily.   Vitamin D (Cholecalciferol) 25 MCG (1000 UT) Caps Take 1,000 Units by mouth daily.   Xarelto 20 MG Tabs tablet Generic drug: rivaroxaban TAKE 1 TABLET BY MOUTH DAILY WITH SUPPER. What changed: See the new instructions.            Home Infusion Instuctions  (From admission, onward)         Start     Ordered   06/16/19 0000  Home infusion instructions [facility] May  follow [place] Dosing Protocol; May administer Cathflo as needed to maintain patency of vascular access device.; Flushing of vascular access device: per [facility] Protocol: 0.9% NaCl pre/post medica...    Question Answer Comment  Instructions May follow [place] Dosing Protocol   Instructions May administer Cathflo as needed to maintain patency of vascular access device.   Instructions Flushing of vascular access device: per [facility] Protocol: 0.9% NaCl pre/post medication administration and prn patency; Heparin 100 u/ml, 44ml for implanted ports and Heparin 10u/ml, 89ml for all other central venous catheters.   Instructions May follow [facility] Anaphylaxis Protocol for First Dose Administration in the home: 0.9% NaCl at 25-50 ml/hr to maintain IV access for protocol meds. Epinephrine 0.3 ml IV/IM PRN and Benadryl 25-50 IV/IM PRN s/s of anaphylaxis.   Instructions [facility] Infusion Coordinator (RN) to assist per patient IV care needs in the home PRN.      06/16/19 0811           Discharge Care Instructions  (From admission, onward)         Start     Ordered   06/16/19 0000  Change dressing    Comments: Maintain surgical dressing until follow up in the clinic. If the edges start to pull up, may reinforce with tape. If the dressing is no longer working, may remove and cover with gauze and tape, but must keep the area dry and clean.  Call with any questions or concerns.   06/16/19 0814   06/16/19 0000  Partial weight bearing    Question Answer Comment  % Body Weight 50   Laterality right   Extremity Lower      06/16/19 0814           Signed: [place] P[place]. Rafael Quesada   PA-C  06/20/2019, 10:05 AM

## 2019-06-22 DIAGNOSIS — A4901 Methicillin susceptible Staphylococcus aureus infection, unspecified site: Secondary | ICD-10-CM | POA: Diagnosis not present

## 2019-06-22 DIAGNOSIS — T8459XA Infection and inflammatory reaction due to other internal joint prosthesis, initial encounter: Secondary | ICD-10-CM | POA: Diagnosis not present

## 2019-06-24 DIAGNOSIS — T8459XA Infection and inflammatory reaction due to other internal joint prosthesis, initial encounter: Secondary | ICD-10-CM | POA: Diagnosis not present

## 2019-06-24 DIAGNOSIS — A4901 Methicillin susceptible Staphylococcus aureus infection, unspecified site: Secondary | ICD-10-CM | POA: Diagnosis not present

## 2019-06-26 ENCOUNTER — Other Ambulatory Visit (HOSPITAL_COMMUNITY)
Admission: RE | Admit: 2019-06-26 | Discharge: 2019-06-26 | Disposition: A | Discharge status: Home / Self Care | Payer: 59 | Source: Other Acute Inpatient Hospital | Attending: Orthopedic Surgery | Admitting: Orthopedic Surgery

## 2019-06-26 DIAGNOSIS — Z4789 Encounter for other orthopedic aftercare: Secondary | ICD-10-CM | POA: Diagnosis not present

## 2019-06-26 DIAGNOSIS — T8459XA Infection and inflammatory reaction due to other internal joint prosthesis, initial encounter: Secondary | ICD-10-CM | POA: Diagnosis not present

## 2019-06-26 DIAGNOSIS — A4901 Methicillin susceptible Staphylococcus aureus infection, unspecified site: Secondary | ICD-10-CM | POA: Insufficient documentation

## 2019-06-26 LAB — CBC WITH DIFFERENTIAL/PLATELET
Abs Immature Granulocytes: 0.13 10*3/uL — ABNORMAL HIGH (ref 0.00–0.07)
Basophils Absolute: 0.1 10*3/uL (ref 0.0–0.1)
Basophils Relative: 1 %
Eosinophils Absolute: 0.2 10*3/uL (ref 0.0–0.5)
Eosinophils Relative: 2 %
HCT: 46.4 % (ref 39.0–52.0)
Hemoglobin: 15.9 g/dL (ref 13.0–17.0)
Immature Granulocytes: 2 %
Lymphocytes Relative: 25 %
Lymphs Abs: 1.8 10*3/uL (ref 0.7–4.0)
MCH: 29.7 pg (ref 26.0–34.0)
MCHC: 34.3 g/dL (ref 30.0–36.0)
MCV: 86.6 fL (ref 80.0–100.0)
Monocytes Absolute: 0.7 10*3/uL (ref 0.1–1.0)
Monocytes Relative: 10 %
Neutro Abs: 4.3 10*3/uL (ref 1.7–7.7)
Neutrophils Relative %: 60 %
Platelets: 240 10*3/uL (ref 150–400)
RBC: 5.36 MIL/uL (ref 4.22–5.81)
RDW: 14 % (ref 11.5–15.5)
WBC: 7.2 10*3/uL (ref 4.0–10.5)
nRBC: 0 % (ref 0.0–0.2)

## 2019-06-26 LAB — C-REACTIVE PROTEIN: CRP: 1.4 mg/dL — ABNORMAL HIGH (ref <1.0)

## 2019-06-26 LAB — BASIC METABOLIC PANEL
Anion gap: 10 (ref 5–15)
BUN: 23 mg/dL — ABNORMAL HIGH (ref 6–20)
CO2: 22 mmol/L (ref 22–32)
Calcium: 9 mg/dL (ref 8.9–10.3)
Chloride: 101 mmol/L (ref 98–111)
Creatinine, Ser: 1.04 mg/dL (ref 0.61–1.24)
GFR calc Af Amer: >60 mL/min (ref >60)
GFR calc non Af Amer: >60 mL/min (ref >60)
Glucose, Bld: 153 mg/dL — ABNORMAL HIGH (ref 70–99)
Potassium: 4 mmol/L (ref 3.5–5.1)
Sodium: 133 mmol/L — ABNORMAL LOW (ref 135–145)

## 2019-06-26 LAB — CK: Total CK: 43 U/L — ABNORMAL LOW (ref 49–397)

## 2019-06-26 LAB — SEDIMENTATION RATE: Sed Rate: 8 mm/hr (ref 0–16)

## 2019-06-26 MED FILL — METHOCARBAMOL 500 MG TABS: 500 | 15 days supply | Qty: 60 | Fill #1

## 2019-07-01 DIAGNOSIS — A4901 Methicillin susceptible Staphylococcus aureus infection, unspecified site: Secondary | ICD-10-CM | POA: Diagnosis not present

## 2019-07-01 DIAGNOSIS — T8459XA Infection and inflammatory reaction due to other internal joint prosthesis, initial encounter: Secondary | ICD-10-CM | POA: Diagnosis not present

## 2019-07-03 ENCOUNTER — Other Ambulatory Visit (HOSPITAL_COMMUNITY)
Admission: RE | Admit: 2019-07-03 | Discharge: 2019-07-03 | Disposition: A | Discharge status: Home / Self Care | Payer: 59 | Source: Other Acute Inpatient Hospital | Attending: Orthopedic Surgery | Admitting: Orthopedic Surgery

## 2019-07-03 DIAGNOSIS — A4901 Methicillin susceptible Staphylococcus aureus infection, unspecified site: Secondary | ICD-10-CM | POA: Diagnosis not present

## 2019-07-03 DIAGNOSIS — T8459XA Infection and inflammatory reaction due to other internal joint prosthesis, initial encounter: Secondary | ICD-10-CM | POA: Diagnosis not present

## 2019-07-03 LAB — CK: Total CK: 180 U/L (ref 49–397)

## 2019-07-03 LAB — CBC WITH DIFFERENTIAL/PLATELET
Abs Immature Granulocytes: 0.04 10*3/uL (ref 0.00–0.07)
Basophils Absolute: 0 10*3/uL (ref 0.0–0.1)
Basophils Relative: 0 %
Eosinophils Absolute: 0.1 10*3/uL (ref 0.0–0.5)
Eosinophils Relative: 2 %
HCT: 44.9 % (ref 39.0–52.0)
Hemoglobin: 15.6 g/dL (ref 13.0–17.0)
Immature Granulocytes: 1 %
Lymphocytes Relative: 28 %
Lymphs Abs: 2 10*3/uL (ref 0.7–4.0)
MCH: 29.8 pg (ref 26.0–34.0)
MCHC: 34.7 g/dL (ref 30.0–36.0)
MCV: 85.7 fL (ref 80.0–100.0)
Monocytes Absolute: 0.5 10*3/uL (ref 0.1–1.0)
Monocytes Relative: 8 %
Neutro Abs: 4.2 10*3/uL (ref 1.7–7.7)
Neutrophils Relative %: 61 %
Platelets: 210 10*3/uL (ref 150–400)
RBC: 5.24 MIL/uL (ref 4.22–5.81)
RDW: 14.1 % (ref 11.5–15.5)
WBC: 6.9 10*3/uL (ref 4.0–10.5)
nRBC: 0 % (ref 0.0–0.2)

## 2019-07-03 LAB — BASIC METABOLIC PANEL
Anion gap: 9 (ref 5–15)
BUN: 19 mg/dL (ref 6–20)
CO2: 22 mmol/L (ref 22–32)
Calcium: 8.9 mg/dL (ref 8.9–10.3)
Chloride: 103 mmol/L (ref 98–111)
Creatinine, Ser: 0.91 mg/dL (ref 0.61–1.24)
GFR calc Af Amer: >60 mL/min (ref >60)
GFR calc non Af Amer: >60 mL/min (ref >60)
Glucose, Bld: 143 mg/dL — ABNORMAL HIGH (ref 70–99)
Potassium: 3.9 mmol/L (ref 3.5–5.1)
Sodium: 134 mmol/L — ABNORMAL LOW (ref 135–145)

## 2019-07-03 LAB — SEDIMENTATION RATE: Sed Rate: 13 mm/hr (ref 0–16)

## 2019-07-03 LAB — C-REACTIVE PROTEIN: CRP: 2.1 mg/dL — ABNORMAL HIGH (ref <1.0)

## 2019-07-08 DIAGNOSIS — T8459XA Infection and inflammatory reaction due to other internal joint prosthesis, initial encounter: Secondary | ICD-10-CM | POA: Diagnosis not present

## 2019-07-08 DIAGNOSIS — A4901 Methicillin susceptible Staphylococcus aureus infection, unspecified site: Secondary | ICD-10-CM | POA: Diagnosis not present

## 2019-07-10 ENCOUNTER — Other Ambulatory Visit (HOSPITAL_COMMUNITY)
Admission: RE | Admit: 2019-07-10 | Discharge: 2019-07-10 | Disposition: A | Discharge status: Home / Self Care | Payer: 59 | Source: Other Acute Inpatient Hospital | Attending: Orthopedic Surgery | Admitting: Orthopedic Surgery

## 2019-07-10 DIAGNOSIS — T8459XA Infection and inflammatory reaction due to other internal joint prosthesis, initial encounter: Secondary | ICD-10-CM | POA: Diagnosis not present

## 2019-07-10 DIAGNOSIS — A4901 Methicillin susceptible Staphylococcus aureus infection, unspecified site: Secondary | ICD-10-CM | POA: Diagnosis not present

## 2019-07-10 LAB — CBC WITH DIFFERENTIAL/PLATELET
Abs Immature Granulocytes: 0.05 10*3/uL (ref 0.00–0.07)
Basophils Absolute: 0 10*3/uL (ref 0.0–0.1)
Basophils Relative: 1 %
Eosinophils Absolute: 0.2 10*3/uL (ref 0.0–0.5)
Eosinophils Relative: 3 %
HCT: 47.3 % (ref 39.0–52.0)
Hemoglobin: 15.9 g/dL (ref 13.0–17.0)
Immature Granulocytes: 1 %
Lymphocytes Relative: 28 %
Lymphs Abs: 1.5 10*3/uL (ref 0.7–4.0)
MCH: 28.9 pg (ref 26.0–34.0)
MCHC: 33.6 g/dL (ref 30.0–36.0)
MCV: 85.8 fL (ref 80.0–100.0)
Monocytes Absolute: 0.5 10*3/uL (ref 0.1–1.0)
Monocytes Relative: 10 %
Neutro Abs: 3 10*3/uL (ref 1.7–7.7)
Neutrophils Relative %: 57 %
Platelets: 221 10*3/uL (ref 150–400)
RBC: 5.51 MIL/uL (ref 4.22–5.81)
RDW: 14.6 % (ref 11.5–15.5)
WBC: 5.2 10*3/uL (ref 4.0–10.5)
nRBC: 0 % (ref 0.0–0.2)

## 2019-07-10 LAB — SEDIMENTATION RATE: Sed Rate: 9 mm/hr (ref 0–16)

## 2019-07-10 LAB — BASIC METABOLIC PANEL
Anion gap: 11 (ref 5–15)
BUN: 18 mg/dL (ref 6–20)
CO2: 22 mmol/L (ref 22–32)
Calcium: 8.9 mg/dL (ref 8.9–10.3)
Chloride: 104 mmol/L (ref 98–111)
Creatinine, Ser: 1.04 mg/dL (ref 0.61–1.24)
GFR calc Af Amer: >60 mL/min (ref >60)
GFR calc non Af Amer: >60 mL/min (ref >60)
Glucose, Bld: 148 mg/dL — ABNORMAL HIGH (ref 70–99)
Potassium: 3.6 mmol/L (ref 3.5–5.1)
Sodium: 137 mmol/L (ref 135–145)

## 2019-07-10 LAB — C-REACTIVE PROTEIN: CRP: <0.8 mg/dL (ref <1.0)

## 2019-07-10 LAB — CK: Total CK: 246 U/L (ref 49–397)

## 2019-07-10 MED FILL — PANTOPRAZOLE SOD DR 40 MG T: 40 | 90 days supply | Qty: 180 | Fill #2

## 2019-07-11 DIAGNOSIS — H524 Presbyopia: Secondary | ICD-10-CM | POA: Diagnosis not present

## 2019-07-11 LAB — HM DIABETES EYE EXAM

## 2019-07-12 DIAGNOSIS — T8459XA Infection and inflammatory reaction due to other internal joint prosthesis, initial encounter: Secondary | ICD-10-CM | POA: Diagnosis not present

## 2019-07-12 DIAGNOSIS — A4901 Methicillin susceptible Staphylococcus aureus infection, unspecified site: Secondary | ICD-10-CM | POA: Diagnosis not present

## 2019-07-15 DIAGNOSIS — A4901 Methicillin susceptible Staphylococcus aureus infection, unspecified site: Secondary | ICD-10-CM | POA: Diagnosis not present

## 2019-07-15 DIAGNOSIS — T8459XA Infection and inflammatory reaction due to other internal joint prosthesis, initial encounter: Secondary | ICD-10-CM | POA: Diagnosis not present

## 2019-07-17 ENCOUNTER — Other Ambulatory Visit (HOSPITAL_COMMUNITY)
Admission: RE | Admit: 2019-07-17 | Discharge: 2019-07-17 | Disposition: A | Discharge status: Home / Self Care | Payer: 59 | Source: Other Acute Inpatient Hospital | Attending: Orthopedic Surgery | Admitting: Orthopedic Surgery

## 2019-07-17 DIAGNOSIS — T8459XA Infection and inflammatory reaction due to other internal joint prosthesis, initial encounter: Secondary | ICD-10-CM | POA: Diagnosis not present

## 2019-07-17 DIAGNOSIS — A4901 Methicillin susceptible Staphylococcus aureus infection, unspecified site: Secondary | ICD-10-CM | POA: Diagnosis not present

## 2019-07-17 LAB — CBC WITH DIFFERENTIAL/PLATELET
Abs Immature Granulocytes: 0.06 10*3/uL (ref 0.00–0.07)
Basophils Absolute: 0 10*3/uL (ref 0.0–0.1)
Basophils Relative: 1 %
Eosinophils Absolute: 0.2 10*3/uL (ref 0.0–0.5)
Eosinophils Relative: 3 %
HCT: 50.4 % (ref 39.0–52.0)
Hemoglobin: 16.7 g/dL (ref 13.0–17.0)
Immature Granulocytes: 1 %
Lymphocytes Relative: 29 %
Lymphs Abs: 1.6 10*3/uL (ref 0.7–4.0)
MCH: 29.1 pg (ref 26.0–34.0)
MCHC: 33.1 g/dL (ref 30.0–36.0)
MCV: 88 fL (ref 80.0–100.0)
Monocytes Absolute: 0.5 10*3/uL (ref 0.1–1.0)
Monocytes Relative: 8 %
Neutro Abs: 3.2 10*3/uL (ref 1.7–7.7)
Neutrophils Relative %: 58 %
Platelets: 207 10*3/uL (ref 150–400)
RBC: 5.73 MIL/uL (ref 4.22–5.81)
RDW: 14.7 % (ref 11.5–15.5)
WBC: 5.5 10*3/uL (ref 4.0–10.5)
nRBC: 0 % (ref 0.0–0.2)

## 2019-07-17 LAB — BASIC METABOLIC PANEL
Anion gap: 10 (ref 5–15)
BUN: 16 mg/dL (ref 6–20)
CO2: 25 mmol/L (ref 22–32)
Calcium: 9.7 mg/dL (ref 8.9–10.3)
Chloride: 101 mmol/L (ref 98–111)
Creatinine, Ser: 1.01 mg/dL (ref 0.61–1.24)
GFR calc Af Amer: >60 mL/min (ref >60)
GFR calc non Af Amer: >60 mL/min (ref >60)
Glucose, Bld: 132 mg/dL — ABNORMAL HIGH (ref 70–99)
Potassium: 4 mmol/L (ref 3.5–5.1)
Sodium: 136 mmol/L (ref 135–145)

## 2019-07-17 LAB — CK: Total CK: 134 U/L (ref 49–397)

## 2019-07-17 LAB — SEDIMENTATION RATE: Sed Rate: 3 mm/hr (ref 0–16)

## 2019-07-17 LAB — C-REACTIVE PROTEIN: CRP: <0.8 mg/dL (ref <1.0)

## 2019-07-20 ENCOUNTER — Telehealth: Payer: Self-pay

## 2019-07-20 ENCOUNTER — Other Ambulatory Visit: Payer: Self-pay

## 2019-07-20 ENCOUNTER — Ambulatory Visit (INDEPENDENT_AMBULATORY_CARE_PROVIDER_SITE_OTHER): Payer: 59 | Admitting: Internal Medicine

## 2019-07-20 DIAGNOSIS — Z96659 Presence of unspecified artificial knee joint: Secondary | ICD-10-CM

## 2019-07-20 DIAGNOSIS — T8459XD Infection and inflammatory reaction due to other internal joint prosthesis, subsequent encounter: Secondary | ICD-10-CM | POA: Diagnosis not present

## 2019-07-20 MED FILL — JARDIANCE 10 MG TABLET: 10 | 30 days supply | Qty: 30 | Fill #3

## 2019-07-20 NOTE — Assessment & Plan Note (Signed)
He is improving clinically and his inflammatory markers have normalized.  He is scheduled to complete 6 weeks of IV daptomycin on 07/26/2019.  He is fully aware that the only true test of cure is to eventually stop antibiotics and see what happens.  He is comfortable stopping next week.  He knows to call me right away if he has any signs or symptoms of relapse.

## 2019-07-20 NOTE — Telephone Encounter (Signed)
Pole Ojea to confirm end date for patients antibiotics; spoke with Debbie who confirmed the medication's end date is 11/25. Gave verbal orders to pull PICC after last dose, per Dr. Megan Salon.   Crytal Pensinger Lorita Officer, RN

## 2019-07-20 NOTE — Progress Notes (Signed)
[facility]  Patient Active Problem List   Diagnosis Date Noted  . 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 Infected right TKA 09/10/2018    Priority: High  . S/P TKR (total knee replacement), right 05/31/2018    Priority: High  . S/P right TK rev 06/15/2019  . Status post revision of total knee, right 06/15/2019  . 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 High risk medication use 11/08/2018  . S/P left TKA 06/28/2018  . Osteoarthritis of right knee 05/09/2018  . Tubular adenoma of colon 10/18/2017  . 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 Diabetic nephropathy associated with type 2 diabetes mellitus ([place]) 10/13/2016  . Permanent atrial fibrillation ([place]) 10/13/2016  . Health examination of defined subpopulation 11/15/2015  . Polycythemia vera ([place]) 10/09/2014  . Hyperlipidemia associated with type 2 diabetes mellitus ([place]) 09/17/2014  . Hypertension associated with diabetes ([place]) 09/17/2014  . Diabetes mellitus ([place]) 10/31/2013  . Hx-TIA (transient ischemic attack) 09/05/2012  . GERD (gastroesophageal reflux disease) 08/17/2011  . Obesity (BMI 30-39.9) 08/17/2011  . Psoriatic arthritis ([place]) 08/17/2011  . Obstructive sleep apnea 08/05/2009    Patient's Medications  New Prescriptions   No medications on file  Previous Medications   ACCU-CHEK FASTCLIX LANCETS MISC    CHECK BLOOD SUGAR TWICE A DAY   CARVEDILOL (COREG) 25 MG TABLET    Take 25 mg by mouth 2 (two) times daily.   DAPTOMYCIN 940 MG IN SODIUM CHLORIDE 0.9 % 100 ML    Inject 940 mg into the vein daily at 8 pm.   DOCUSATE SODIUM (COLACE) 100 MG CAPSULE    Take 1 capsule (100 mg total) by mouth 2 (two) times daily.   FERROUS SULFATE (FERROUSUL) 325 (65  FE) MG TABLET    Take 1 tablet (325 mg total) by mouth 3 (three) times daily with meals for 14 days.   GLUCOSE BLOOD (ACCU-CHEK GUIDE) TEST STRIP    USE TO TEST BLOOD GLUCOSE TWICE DAILY   HYDROCODONE-ACETAMINOPHEN (NORCO) 7.5-325 MG TABLET    Take 1-2 tablets by mouth every 4 (four) hours as needed for moderate pain.   IXEKIZUMAB (TALTZ) 80 MG/ML SOAJ    Inject 80 mg into the skin every 30 (thirty) days.   JARDIANCE 10 MG TABS TABLET    TAKE 1 TABLET BY MOUTH ONCE DAILY   MAGNESIUM HYDROXIDE (MILK OF MAGNESIA) 400 MG/5ML SUSPENSION    Take 5 mLs by mouth daily as needed for mild constipation.   METHOCARBAMOL (ROBAXIN) 500 MG TABLET    Take 1 tablet (500 mg total) by mouth every 6 (six) hours as needed for muscle spasms.   MULTIPLE VITAMIN (MULTIVITAMIN WITH MINERALS) TABS TABLET    Take 1 tablet by mouth daily.   OXYMETAZOLINE (AFRIN) 0.05 % NASAL SPRAY    Place 1 spray into both nostrils at bedtime.    PANTOPRAZOLE (PROTONIX) 40 MG TABLET    Take 1 tablet (40 mg total) by mouth 2 (two) times daily.   POLYETHYLENE GLYCOL (MIRALAX / GLYCOLAX) 17 G PACKET    Take 17 g by mouth 2 (two) times daily.   ROSUVASTATIN (CRESTOR) 10 MG TABLET    Take 1 tablet (10 mg total) by mouth daily.   SACUBITRIL-VALSARTAN (ENTRESTO) 24-26 MG  Take 1 tablet by mouth 2 (two) times daily.    VITAMIN D, CHOLECALCIFEROL, 1000 UNITS CAPS    Take 1,000 Units by mouth daily.    XARELTO 20 MG TABS TABLET    TAKE 1 TABLET BY MOUTH DAILY WITH SUPPER.  Modified Medications   No medications on file  Discontinued Medications   No medications on file    Subjective: [name] was rehospitalized in mid October with early relapse of his MSSA right prosthetic knee infection.  He underwent debridement and had his antibiotic impregnated spacer replaced.  Operative cultures grew MSSA again.  A PICC line was replaced and he was discharged on IV vancomycin before cultures were finalized.  I switched him to IV daptomycin and he has now  completed 5 weeks of therapy.  He has not had any problems tolerating his PICC or daptomycin.  He is feeling much better.  He is not having the GI upset that he had while taking cefazolin.  His knee pain has improved and he has been able to be much more active.  He is still taking methocarbamol and Tylenol at bedtime but has not required any narcotic pain medication since right after surgery.  Review of Systems: Review of Systems  Constitutional: Negative for fever.  Gastrointestinal: Negative for abdominal pain, diarrhea, nausea and vomiting.  Musculoskeletal: Positive for joint pain.    Past Medical History:  Diagnosis Date  . Arthritis   . Diabetes mellitus without complication ([place])    TYPE 2  . Diverticulosis   . Dyslipidemia   . GERD (gastroesophageal reflux disease)   . Herpes labialis   . Hypertension   . Longstanding persistent atrial fibrillation ([place])   . Metabolic syndrome   . Obesity   . Proteinuria   . Psoriasis   . Seborrheic dermatitis   . Sleep apnea    very compliant with CPAP, PT NEEDS TO BRING OWN MACHINE  . TIA (transient ischemic attack) 2009    Social History   Tobacco Use  . Smoking status: Never Smoker  . Smokeless tobacco: Never Used  Substance Use Topics  . Alcohol use: Yes    Comment: 2/month  . Drug use: No    Family History  Problem Relation Age of Onset  . Uterine cancer Mother 5       Cervical cath  . Crohn's disease Mother   . Esophageal cancer Father   . Cancer Other   . Obesity Other     Allergies  Allergen Reactions  . Morphine And Related Other (See Comments)    Hallucinations    Objective: Vitals:   07/20/19 1048  BP: 136/88  Pulse: 89  Temp: 97.9 F (36.6 C)   There is no height or weight on file to calculate BMI.  Physical Exam Constitutional:      Comments: He is in good spirits.  He is accompanied by his wife, [name]  Musculoskeletal:     Comments: His right knee incision has healed nicely.  He still has  diffuse swelling of his right knee but the warmth and erythema is much improved.  He still has restricted range of motion but this has improved as well.  Skin:    Comments: His right arm PICC site looks good.     Lab Results 07/17/2019 Sedimentation rate 3 C-reactive protein less than 0.8    Problem List Items Addressed This Visit      High   Infected right TKA    He is improving  clinically and his inflammatory markers have normalized.  He is scheduled to complete 6 weeks of IV daptomycin on 07/26/2019.  He is fully aware that the only true test of cure is to eventually stop antibiotics and see what happens.  He is comfortable stopping next week.  He knows to call me right away if he has any signs or symptoms of relapse.          Michel Bickers, MD [facility] for [facility] [phone] pager   [phone] cell 07/20/2019, 11:24 AM

## 2019-07-22 DIAGNOSIS — A4901 Methicillin susceptible Staphylococcus aureus infection, unspecified site: Secondary | ICD-10-CM | POA: Diagnosis not present

## 2019-07-22 DIAGNOSIS — T8459XA Infection and inflammatory reaction due to other internal joint prosthesis, initial encounter: Secondary | ICD-10-CM | POA: Diagnosis not present

## 2019-07-24 ENCOUNTER — Other Ambulatory Visit (HOSPITAL_COMMUNITY)
Admission: RE | Admit: 2019-07-24 | Discharge: 2019-07-24 | Disposition: A | Discharge status: Home / Self Care | Payer: 59 | Source: Other Acute Inpatient Hospital | Attending: Orthopedic Surgery | Admitting: Orthopedic Surgery

## 2019-07-24 DIAGNOSIS — A4901 Methicillin susceptible Staphylococcus aureus infection, unspecified site: Secondary | ICD-10-CM | POA: Diagnosis not present

## 2019-07-24 DIAGNOSIS — T8459XA Infection and inflammatory reaction due to other internal joint prosthesis, initial encounter: Secondary | ICD-10-CM | POA: Diagnosis not present

## 2019-07-24 LAB — CBC WITH DIFFERENTIAL/PLATELET
Abs Immature Granulocytes: 0.04 10*3/uL (ref 0.00–0.07)
Basophils Absolute: 0 10*3/uL (ref 0.0–0.1)
Basophils Relative: 1 %
Eosinophils Absolute: 0.1 10*3/uL (ref 0.0–0.5)
Eosinophils Relative: 2 %
HCT: 51.7 % (ref 39.0–52.0)
Hemoglobin: 17.1 g/dL — ABNORMAL HIGH (ref 13.0–17.0)
Immature Granulocytes: 1 %
Lymphocytes Relative: 30 %
Lymphs Abs: 1.8 10*3/uL (ref 0.7–4.0)
MCH: 28.7 pg (ref 26.0–34.0)
MCHC: 33.1 g/dL (ref 30.0–36.0)
MCV: 86.9 fL (ref 80.0–100.0)
Monocytes Absolute: 0.5 10*3/uL (ref 0.1–1.0)
Monocytes Relative: 9 %
Neutro Abs: 3.5 10*3/uL (ref 1.7–7.7)
Neutrophils Relative %: 57 %
Platelets: 206 10*3/uL (ref 150–400)
RBC: 5.95 MIL/uL — ABNORMAL HIGH (ref 4.22–5.81)
RDW: 15.2 % (ref 11.5–15.5)
WBC: 6.1 10*3/uL (ref 4.0–10.5)
nRBC: 0 % (ref 0.0–0.2)

## 2019-07-24 LAB — BASIC METABOLIC PANEL
Anion gap: 10 (ref 5–15)
BUN: 20 mg/dL (ref 6–20)
CO2: 22 mmol/L (ref 22–32)
Calcium: 9.1 mg/dL (ref 8.9–10.3)
Chloride: 103 mmol/L (ref 98–111)
Creatinine, Ser: 1.06 mg/dL (ref 0.61–1.24)
GFR calc Af Amer: >60 mL/min (ref >60)
GFR calc non Af Amer: >60 mL/min (ref >60)
Glucose, Bld: 169 mg/dL — ABNORMAL HIGH (ref 70–99)
Potassium: 3.9 mmol/L (ref 3.5–5.1)
Sodium: 135 mmol/L (ref 135–145)

## 2019-07-24 LAB — SEDIMENTATION RATE: Sed Rate: 2 mm/hr (ref 0–16)

## 2019-07-24 LAB — CK: Total CK: 147 U/L (ref 49–397)

## 2019-07-24 LAB — C-REACTIVE PROTEIN: CRP: 1.2 mg/dL — ABNORMAL HIGH (ref <1.0)

## 2019-07-24 MED FILL — ENTRESTO 24 MG-26 MG TABLET: 24-26 | 30 days supply | Qty: 60 | Fill #3

## 2019-07-26 DIAGNOSIS — A4901 Methicillin susceptible Staphylococcus aureus infection, unspecified site: Secondary | ICD-10-CM | POA: Diagnosis not present

## 2019-07-26 DIAGNOSIS — T8459XA Infection and inflammatory reaction due to other internal joint prosthesis, initial encounter: Secondary | ICD-10-CM | POA: Diagnosis not present

## 2019-08-08 ENCOUNTER — Other Ambulatory Visit: Payer: Self-pay | Admitting: Family Medicine

## 2019-08-08 MED FILL — ACCU-CHEK FASTCLIX LANCETS: 90 days supply | Qty: 204 | Fill #2

## 2019-08-08 MED FILL — ACCU-CHEK GUIDE TEST STRIP: 90 days supply | Qty: 200 | Fill #0

## 2019-08-14 ENCOUNTER — Telehealth: Payer: Self-pay | Admitting: Family Medicine

## 2019-08-14 ENCOUNTER — Other Ambulatory Visit: Payer: 59

## 2019-08-14 ENCOUNTER — Other Ambulatory Visit: Payer: Self-pay

## 2019-08-14 DIAGNOSIS — T8453XD Infection and inflammatory reaction due to internal right knee prosthesis, subsequent encounter: Secondary | ICD-10-CM

## 2019-08-14 DIAGNOSIS — E785 Hyperlipidemia, unspecified: Secondary | ICD-10-CM

## 2019-08-14 DIAGNOSIS — E1169 Type 2 diabetes mellitus with other specified complication: Secondary | ICD-10-CM

## 2019-08-14 DIAGNOSIS — I5022 Chronic systolic (congestive) heart failure: Secondary | ICD-10-CM

## 2019-08-14 DIAGNOSIS — I4819 Other persistent atrial fibrillation: Secondary | ICD-10-CM

## 2019-08-14 NOTE — Telephone Encounter (Signed)
Pt called and is requesting to come in and have labs done he is having knee surgery done on Dec the 22nd he has to order form with him and states that he needs Sedimentation and C-reactive protein done, pt would like to have that done this afternoon pt can be reached at [phone]

## 2019-08-14 NOTE — Telephone Encounter (Signed)
Received requested records from [facility]  

## 2019-08-15 LAB — SEDIMENTATION RATE: Sed Rate: 3 mm/hr (ref 0–30)

## 2019-08-15 LAB — C-REACTIVE PROTEIN: CRP: 2 mg/L (ref 0–10)

## 2019-08-15 MED FILL — JARDIANCE 10 MG TABLET: 10 | 30 days supply | Qty: 30 | Fill #4

## 2019-08-15 NOTE — Patient Instructions (Signed)
DUE TO COVID-19 NO VISITOR IS ALLOWED TO COME WITH YOU.    YOU NEED TO HAVE A COVID 19 TEST ON_12/18/20______ @_9 :05______, THIS TEST MUST BE DONE BEFORE SURGERY, COME  [place], [address] [place] , [phone].  ([place])   ONCE YOUR COVID TEST IS COMPLETED, PLEASE BEGIN THE QUARANTINE INSTRUCTIONS AS OUTLINED IN YOUR HANDOUT.                [name]    Your procedure is scheduled on: 08/22/19   Report to [facility] Main  Entrance   Report to admitting at 9:00 AM     Call this number if you have problems the morning of surgery [place], NO CHEWING GUM [place].   Do not eat food After Midnight.   YOU MAY HAVE CLEAR LIQUIDS FROM MIDNIGHT UNTIL 8:30AM    CLEAR LIQUID DIET   Foods Allowed                                                                     Foods Excluded  Coffee and tea, regular and decaf                             liquids that you cannot  Plain Jell-O any favor except red or purple                                           see through such as: Fruit ices (not with fruit pulp)                                     milk, soups, orange juice  Iced Popsicles                                    All solid food Carbonated beverages, regular and diet                                    Cranberry, grape and apple juices Sports drinks like Gatorade Lightly seasoned clear broth or consume(fat free) Sugar, honey syrup    . At 8:30 AM Please finish the prescribed Pre-Surgery Gatorade drink.   Nothing by mouth after you finish the Gatorade drink !    Take these medicines the morning of surgery with A SIP OF WATER: Coreg,Protonix  DO NOT TAKE ANY DIABETIC MEDICATIONS DAY OF YOUR SURGERY   How to Manage Your Diabetes Before and After Surgery  Why is it important to control my blood sugar before and after surgery? . Improving blood sugar levels before  and after surgery helps healing and can limit problems. . A way of improving blood sugar control is eating a healthy diet by: o  Eating less sugar and carbohydrates o  Increasing activity/exercise o  Talking with your doctor about reaching your blood sugar goals . High blood sugars (greater than 180 mg/dL) can raise your risk of infections and slow your recovery, so you will need to focus on controlling your diabetes during the weeks before surgery. . Make sure that the doctor who takes care of your diabetes knows about your planned surgery including the date and location.  How do I manage my blood sugar before surgery? . Check your blood sugar at least 4 times a day, starting 2 days before surgery, to make sure that the level is not too high or low. o Check your blood sugar the morning of your surgery when you wake up and every 2 hours until you get to the Short Stay unit. . If your blood sugar is less than 70 mg/dL, you will need to treat for low blood sugar: o Do not take insulin. o Treat a low blood sugar (less than 70 mg/dL) with  cup of clear juice (cranberry or apple), 4 glucose tablets, OR glucose gel. o Recheck blood sugar in 15 minutes after treatment (to make sure it is greater than 70 mg/dL). If your blood sugar is not greater than 70 mg/dL on recheck, call [phone] for further instructions. . Report your blood sugar to the short stay nurse when you get to Short Stay.  . If you are admitted to the hospital after surgery: o Your blood sugar will be checked by the staff and you will probably be given insulin after surgery (instead of oral diabetes medicines) to make sure you have good blood sugar levels. o The goal for blood sugar control after surgery is 80-180 mg/dL.   WHAT DO I DO ABOUT MY DIABETES MEDICATION?  Marland Kitchen Do not take oral diabetes medicines (pills) the morning of surgery.   . The day of surgery, do not take other diabetes injectables, including Byetta (exenatide),  Bydureon (exenatide ER), Victoza (liraglutide), or Trulicity (dulaglutide).                               You may not have any metal on your body including               piercings  Do not wear jewelry, lotions, powders or  deodorant               Men may shave face and neck.   Do not bring valuables to the hospital. [place].  Contacts, dentures or bridgework may not be worn into surgery.      Patients discharged the day of surgery will not be allowed to drive home  IF YOU ARE HAVING SURGERY AND GOING HOME THE SAME DAY, YOU MUST HAVE AN ADULT TO DRIVE YOU HOME AND BE WITH YOU FOR 24 HOURS . YOU MAY GO HOME BY TAXI OR UBER OR ORTHERWISE, BUT AN ADULT MUST ACCOMPANY YOU HOME AND STAY WITH YOU FOR 24 HOURS.  Name and phone number of your driver:  Special Instructions: N/A              Please read over the following fact sheets you were given: _____________________________________________________________________             [facility] - Preparing for Surgery  Before surgery, you can play an important role .  Because  skin is not sterile, your skin needs to be as free of germs as possible.   You can reduce the number of germs on your skin by washing with CHG (chlorahexidine gluconate) soap before surgery.   CHG is an antiseptic cleaner which kills germs and bonds with the skin to continue killing germs even after washing. Please DO NOT use if you have an allergy to CHG or antibacterial soaps.   If your skin becomes reddened/irritated stop using the CHG and inform your nurse when you arrive at Short Stay.   You may shave your face/neck.  Please follow these instructions carefully:  1.  Shower with CHG Soap the night before surgery and the  morning of Surgery.  2.  If you choose to wash your hair, wash your hair first as usual with your  normal  shampoo.  3.  After you shampoo, rinse your hair and body thoroughly to remove the   shampoo.                                        4.  Use CHG as you would any other liquid soap.  You can apply chg directly  to the skin and wash                       Gently with a scrungie or clean washcloth.  5.  Apply the CHG Soap to your body ONLY FROM THE NECK DOWN.   Do not use on face/ open                           Wound or open sores. Avoid contact with eyes, ears mouth and genitals (private parts).                       Wash face,  Genitals (private parts) with your normal soap.             6.  Wash thoroughly, paying special attention to the area where your surgery  will be performed.  7.  Thoroughly rinse your body with warm water from the neck down.  8.  DO NOT shower/wash with your normal soap after using and rinsing off  the CHG Soap.             9.  Pat yourself dry with a clean towel.            10.  Wear clean pajamas.            11.  Place clean sheets on your bed the night of your first shower and do not  sleep with pets. Day of Surgery : Do not apply any lotions/deodorants the morning of surgery.  Please wear clean clothes to the hospital/surgery center.  FAILURE TO FOLLOW THESE INSTRUCTIONS MAY RESULT IN THE CANCELLATION OF YOUR SURGERY PATIENT SIGNATURE_________________________________  NURSE SIGNATURE__________________________________  ________________________________________________________________________   [name]  An incentive spirometer is a tool that can help keep your lungs clear and active. This tool measures how well you are filling your lungs with each breath. Taking long deep breaths may help reverse or decrease the chance of developing breathing (pulmonary) problems (especially infection) following:  A long period of time when you are unable to move or be active. BEFORE THE PROCEDURE   If the spirometer includes an  indicator to show your best effort, your nurse or respiratory therapist will set it to a desired goal.  If possible, sit  up straight or lean slightly forward. Try not to slouch.  Hold the incentive spirometer in an upright position. INSTRUCTIONS FOR USE  1. Sit on the edge of your bed if possible, or sit up as far as you can in bed or on a chair. 2. Hold the incentive spirometer in an upright position. 3. Breathe out normally. 4. Place the mouthpiece in your mouth and seal your lips tightly around it. 5. Breathe in slowly and as deeply as possible, raising the piston or the ball toward the top of the column. 6. Hold your breath for 3-5 seconds or for as long as possible. Allow the piston or ball to fall to the bottom of the column. 7. Remove the mouthpiece from your mouth and breathe out normally. 8. Rest for a few seconds and repeat Steps 1 through 7 at least 10 times every 1-2 hours when you are awake. Take your time and take a few normal breaths between deep breaths. 9. The spirometer may include an indicator to show your best effort. Use the indicator as a goal to work toward during each repetition. 10. After each set of 10 deep breaths, practice coughing to be sure your lungs are clear. If you have an incision (the cut made at the time of surgery), support your incision when coughing by placing a pillow or rolled up towels firmly against it. Once you are able to get out of bed, walk around indoors and cough well. You may stop using the incentive spirometer when instructed by your caregiver.  RISKS AND COMPLICATIONS  Take your time so you do not get dizzy or light-headed.  If you are in pain, you may need to take or ask for pain medication before doing incentive spirometry. It is harder to take a deep breath if you are having pain. AFTER USE  Rest and breathe slowly and easily.  It can be helpful to keep track of a log of your progress. Your caregiver can provide you with a simple table to help with this. If you are using the spirometer at home, follow these instructions: [place] IF:   You are  having difficultly using the spirometer.  You have trouble using the spirometer as often as instructed.  Your pain medication is not giving enough relief while using the spirometer.  You develop fever of 100.5 F (38.1 C) or higher. SEEK IMMEDIATE MEDICAL CARE IF:   You cough up bloody sputum that had not been present before.  You develop fever of 102 F (38.9 C) or greater.  You develop worsening pain at or near the incision site. MAKE SURE YOU:   Understand these instructions.  Will watch your condition.  Will get help right away if you are not doing well or get worse. Document Released: 12/28/2006 Document Revised: 11/09/2011 Document Reviewed: 02/28/2007 [facility] Patient Information 2014 [place], [place].   ________________________________________________________________________

## 2019-08-16 ENCOUNTER — Ambulatory Visit: Payer: 59 | Admitting: Cardiovascular Disease

## 2019-08-16 ENCOUNTER — Inpatient Hospital Stay (HOSPITAL_COMMUNITY)
Admission: RE | Admit: 2019-08-16 | Discharge: 2019-08-16 | Disposition: A | Discharge status: Home / Self Care | Payer: 59 | Source: Ambulatory Visit

## 2019-08-16 ENCOUNTER — Other Ambulatory Visit: Payer: Self-pay

## 2019-08-16 ENCOUNTER — Other Ambulatory Visit (HOSPITAL_COMMUNITY): Payer: Self-pay | Admitting: *Deleted

## 2019-08-16 ENCOUNTER — Encounter (HOSPITAL_COMMUNITY): Payer: Self-pay

## 2019-08-16 NOTE — Progress Notes (Signed)
PCP - Dr. Glo Herring Cardiologist - Dr. Rae Halsted  Chest x-ray - 06/15/19 EKG - will do 08/17/19 Stress Test - no ECHO - 05/09/18 Cardiac Cath - no  Sleep Study - Yes CPAP -yes  Fasting Blood Sugar - 135-160 Checks Blood Sugar _QD____ times a day  Blood Thinner Instructions:Xarelto Aspirin Instructions:Dr. Alvan Dame said stop 72 hrs prior to DOS Last Dose:08/18/19  Anesthesia review:   Patient denies shortness of breath, fever, cough and chest pain at PAT appointment yes  Patient verbalized understanding of instructions that were given to them at the PAT appointment. Patient was also instructed that they will need to review over the PAT instructions again at home before surgery. yes

## 2019-08-17 ENCOUNTER — Encounter (HOSPITAL_COMMUNITY)
Admission: RE | Admit: 2019-08-17 | Discharge: 2019-08-17 | Disposition: A | Discharge status: Home / Self Care | Payer: 59 | Source: Ambulatory Visit | Attending: Orthopedic Surgery | Admitting: Orthopedic Surgery

## 2019-08-17 DIAGNOSIS — I10 Essential (primary) hypertension: Secondary | ICD-10-CM | POA: Diagnosis not present

## 2019-08-17 DIAGNOSIS — E118 Type 2 diabetes mellitus with unspecified complications: Secondary | ICD-10-CM | POA: Diagnosis not present

## 2019-08-17 DIAGNOSIS — Z01818 Encounter for other preprocedural examination: Secondary | ICD-10-CM | POA: Diagnosis not present

## 2019-08-17 LAB — BASIC METABOLIC PANEL
Anion gap: 11 (ref 5–15)
BUN: 26 mg/dL — ABNORMAL HIGH (ref 6–20)
CO2: 21 mmol/L — ABNORMAL LOW (ref 22–32)
Calcium: 9.5 mg/dL (ref 8.9–10.3)
Chloride: 106 mmol/L (ref 98–111)
Creatinine, Ser: 1.17 mg/dL (ref 0.61–1.24)
GFR calc Af Amer: >60 mL/min (ref >60)
GFR calc non Af Amer: >60 mL/min (ref >60)
Glucose, Bld: 151 mg/dL — ABNORMAL HIGH (ref 70–99)
Potassium: 4.1 mmol/L (ref 3.5–5.1)
Sodium: 138 mmol/L (ref 135–145)

## 2019-08-17 LAB — HEMOGLOBIN A1C
Hgb A1c MFr Bld: 6.4 % — ABNORMAL HIGH (ref 4.8–5.6)
Mean Plasma Glucose: 136.98 mg/dL

## 2019-08-17 LAB — CBC
HCT: 52.9 % — ABNORMAL HIGH (ref 39.0–52.0)
Hemoglobin: 17.8 g/dL — ABNORMAL HIGH (ref 13.0–17.0)
MCH: 29.5 pg (ref 26.0–34.0)
MCHC: 33.6 g/dL (ref 30.0–36.0)
MCV: 87.6 fL (ref 80.0–100.0)
Platelets: 168 10*3/uL (ref 150–400)
RBC: 6.04 MIL/uL — ABNORMAL HIGH (ref 4.22–5.81)
RDW: 17 % — ABNORMAL HIGH (ref 11.5–15.5)
WBC: 6.7 10*3/uL (ref 4.0–10.5)
nRBC: 0 % (ref 0.0–0.2)

## 2019-08-17 LAB — SURGICAL PCR SCREEN
MRSA, PCR: NEGATIVE
Staphylococcus aureus: NEGATIVE

## 2019-08-17 LAB — GLUCOSE, CAPILLARY: Glucose-Capillary: 147 mg/dL — ABNORMAL HIGH (ref 70–99)

## 2019-08-18 ENCOUNTER — Other Ambulatory Visit (HOSPITAL_COMMUNITY)
Admission: RE | Admit: 2019-08-18 | Discharge: 2019-08-18 | Disposition: A | Discharge status: Home / Self Care | Payer: 59 | Source: Ambulatory Visit | Attending: Orthopedic Surgery | Admitting: Orthopedic Surgery

## 2019-08-18 DIAGNOSIS — Z01812 Encounter for preprocedural laboratory examination: Secondary | ICD-10-CM | POA: Insufficient documentation

## 2019-08-18 DIAGNOSIS — Z20828 Contact with and (suspected) exposure to other viral communicable diseases: Secondary | ICD-10-CM | POA: Diagnosis not present

## 2019-08-19 LAB — NOVEL CORONAVIRUS, NAA (HOSP ORDER, SEND-OUT TO REF LAB; TAT 18-24 HRS): SARS-CoV-2, NAA: NOT DETECTED

## 2019-08-21 ENCOUNTER — Encounter (HOSPITAL_COMMUNITY): Payer: Self-pay | Admitting: Orthopedic Surgery

## 2019-08-21 MED ORDER — DEXTROSE 5 % IV SOLN
3.0000 g | INTRAVENOUS | Status: AC
  Administered 2019-08-22: 11:00:00 3 g via INTRAVENOUS
  Filled 2019-08-21: qty 3

## 2019-08-21 MED FILL — ENTRESTO 24 MG-26 MG TABLET: 24-26 | 30 days supply | Qty: 60 | Fill #4

## 2019-08-21 MED FILL — XARELTO 20 MG TABLET: 20 | 90 days supply | Qty: 90 | Fill #1

## 2019-08-21 NOTE — H&P (Signed)
TOTAL KNEE REIMPLANTATION ADMISSION H&P  Patient is being admitted for right total knee arthroplasty reimplanation.  Subjective:  Chief Complaint:  S/P right TKA resection and placement of antibiotic spacer  HPI: [name]., 58 y.o. male, has a history of pain and functional disability in the right knee(s) due to resection due to infection. He has been doing well on the IV antibiotic as well as off of them.  Lab values indicate that the infection is not returning since being off of antibiotics.  Further discussion was had and he wishes to proceed with a reimplantation.  Risks, benefits and expectations were discussed with the patient.  Risks including but not limited to the risk of anesthesia, blood clots, nerve damage, blood vessel damage, failure of the prosthesis, infection and up to and including death.  Patient understand the risks, benefits and expectations and wishes to proceed with surgery.   PCP: Denita Lung, MD  D/C Plans:       Home  Post-op Meds:       No Rx given   Tranexamic Acid:      To be given - IV   Decadron:      Is to be given  FYI:     Xarelto  Norco   DME:   Pt already has equipment  PT:   OPPT      Patient Active Problem List   Diagnosis Date Noted  . S/P right TK rev 06/15/2019  . Status post revision of total knee, right 06/15/2019  . 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 High risk medication use 11/08/2018  . Infected right TKA 09/10/2018  . S/P left TKA 06/28/2018  . S/P TKR (total knee replacement), right 05/31/2018  . Osteoarthritis of right knee 05/09/2018  . Tubular adenoma of colon 10/18/2017  . 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 Diabetic nephropathy associated with type 2  diabetes mellitus ([place]) 10/13/2016  . Permanent atrial fibrillation ([place]) 10/13/2016  . Health examination of defined subpopulation 11/15/2015  . Polycythemia vera ([place]) 10/09/2014  . Hyperlipidemia associated with type 2 diabetes mellitus ([place]) 09/17/2014  . Hypertension associated with diabetes ([place]) 09/17/2014  . Diabetes mellitus ([place]) 10/31/2013  . Hx-TIA (transient ischemic attack) 09/05/2012  . GERD (gastroesophageal reflux disease) 08/17/2011  . Obesity (BMI 30-39.9) 08/17/2011  . Psoriatic arthritis ([place]) 08/17/2011  . Obstructive sleep apnea 08/05/2009   Past Medical History:  Diagnosis Date  . Arthritis   . Diabetes mellitus without complication ([place])    TYPE 2  . Diverticulosis   . Dyslipidemia   . GERD (gastroesophageal reflux disease)   . Herpes labialis   . Hypertension   . Longstanding persistent atrial fibrillation ([place])   . Metabolic syndrome   . Obesity   . Proteinuria   . Psoriasis   . Seborrheic dermatitis   . Sleep apnea    very compliant with CPAP, PT NEEDS TO BRING OWN MACHINE  . TIA (transient ischemic attack) 2009    Past Surgical History:  Procedure Laterality Date  . CARDIOVERSION N/A 12/02/2016   Procedure: CARDIOVERSION;  Surgeon: Pixie Casino, MD;  Location: [facility] ENDOSCOPY;  Service: Cardiovascular;  Laterality: N/A;  . COLONOSCOPY    . EXCISIONAL TOTAL KNEE ARTHROPLASTY WITH ANTIBIOTIC SPACERS Right 03/28/2019   Procedure: EXCISIONAL  TOTAL KNEE ARTHROPLASTY WITH ANTIBIOTIC SPACERS;  Surgeon: [name], MD;  Location: [facility] ORS;  Service: Orthopedics;  Laterality: Right;  90 mins  . I & D KNEE WITH POLY EXCHANGE Right 09/10/2018   Procedure: Right Knee Arthroplasty IRRIGATION AND DEBRIDEMENT KNEE WITH POLY EXCHANGE;  Surgeon: [name], MD;  Location: [facility] ORS;  Service: Orthopedics;  Laterality: Right;  . JOINT REPLACEMENT  11/03/10   LT HIP  . PFO occluder cardiac valve  2006   Dr. Einar Gip, (hole in heart)  . REIMPLANTATION OF TOTAL KNEE  Right 06/15/2019   Procedure: Resection of antibiotic spacer and  irrigation and debridment and placement of new antibiotic spacer and components;  Surgeon: [name], MD;  Location: [facility] ORS;  Service: Orthopedics;  Laterality: Right;  90 mins  . TONSILLECTOMY AND ADENOIDECTOMY    . TOTAL HIP ARTHROPLASTY Left   . TOTAL KNEE ARTHROPLASTY Right 05/31/2018   Procedure: RIGHT TOTAL KNEE ARTHROPLASTY;  Surgeon: [name], MD;  Location: [facility] ORS;  Service: Orthopedics;  Laterality: Right;  70 mins  . TOTAL KNEE ARTHROPLASTY Left 06/28/2018   Procedure: LEFT TOTAL KNEE ARTHROPLASTY;  Surgeon: [name], MD;  Location: [facility] ORS;  Service: Orthopedics;  Laterality: Left;  70 mins  . WISDOM TOOTH EXTRACTION      No current facility-administered medications for this encounter.   Current Outpatient Medications  Medication Sig Dispense Refill Last Dose  . carvedilol (COREG) 25 MG tablet Take 25 mg by mouth 2 (two) times daily.     Marland Kitchen JARDIANCE 10 MG TABS tablet TAKE 1 TABLET BY MOUTH ONCE DAILY (Patient taking differently: Take 10 mg by mouth daily. ) 30 tablet 5   . Multiple Vitamin (MULTIVITAMIN WITH MINERALS) TABS tablet Take 1 tablet by mouth daily.     Marland Kitchen oxymetazoline (AFRIN) 0.05 % nasal spray Place 1 spray into both nostrils at bedtime.      . pantoprazole (PROTONIX) 40 MG tablet Take 1 tablet (40 mg total) by mouth 2 (two) times daily. (Patient taking differently: Take 40 mg by mouth daily. ) 270 tablet 3   . rosuvastatin (CRESTOR) 10 MG tablet Take 1 tablet (10 mg total) by mouth daily. 90 tablet 3   . sacubitril-valsartan (ENTRESTO) 24-26 MG Take 1 tablet by mouth 2 (two) times daily.  60 tablet 11   . Vitamin D, Cholecalciferol, 1000 units CAPS Take 1,000 Units by mouth daily.      Alveda Reasons 20 MG TABS tablet TAKE 1 TABLET BY MOUTH DAILY WITH SUPPER. (Patient taking differently: Take 20 mg by mouth daily. ) 90 tablet 1   . Accu-Chek FastClix Lancets MISC CHECK BLOOD SUGAR TWICE A DAY 102  each 4   . ACCU-CHEK GUIDE test strip USE TO TEST BLOOD GLUCOSE TWICE DAILY 100 strip 1   . HYDROcodone-acetaminophen (NORCO) 7.5-325 MG tablet Take 1-2 tablets by mouth every 4 (four) hours as needed for moderate pain. (Patient not taking: Reported on 06/20/2019) 60 tablet 0   . Ixekizumab (TALTZ) 80 MG/ML SOAJ Inject 80 mg into the skin every 30 (thirty) days. (Patient not taking: Reported on 08/11/2019) 1 mL 11 Not Taking at Unknown time  . magnesium hydroxide (MILK OF MAGNESIA) 400 MG/5ML suspension Take 5 mLs by mouth daily as needed for mild constipation.     . methocarbamol (ROBAXIN) 500 MG tablet Take 1 tablet (500 mg total) by mouth every 6 (six) hours as needed for muscle spasms. (Patient not taking: Reported on 08/11/2019) 40 tablet 0  Not Taking at Unknown time   Allergies  Allergen Reactions  . Morphine And Related Other (See Comments)    Hallucinations    Social History   Tobacco Use  . Smoking status: Never Smoker  . Smokeless tobacco: Never Used  Substance Use Topics  . Alcohol use: Yes    Comment: 2/month    Family History  Problem Relation Age of Onset  . Uterine cancer Mother 87       Cervical cath  . Crohn's disease Mother   . Esophageal cancer Father   . Cancer Other   . Obesity Other      Review of Systems  Constitutional: Negative.   HENT: Negative.   Eyes: Negative.   Respiratory: Negative.   Cardiovascular: Negative.   Gastrointestinal: Positive for heartburn.  Genitourinary: Negative.   Musculoskeletal: Positive for joint pain.  Skin: Negative.   Neurological: Negative.   Endo/Heme/Allergies: Negative.   Psychiatric/Behavioral: Negative.       Objective:  Physical Exam  Constitutional: He is oriented to person, place, and time. He appears well-developed.  HENT:  Head: Normocephalic.  Eyes: Pupils are equal, round, and reactive to light.  Neck: No JVD present. No tracheal deviation present. No thyromegaly present.  Cardiovascular: Normal  rate, regular rhythm and intact distal pulses.  Respiratory: Effort normal and breath sounds normal. No respiratory distress. He has no wheezes.  GI: Soft. There is no abdominal tenderness. There is no guarding.  Musculoskeletal:     Cervical back: Neck supple.     Right knee: No deformity, erythema, ecchymosis, lacerations (healed previous incision) or bony tenderness. Decreased range of motion. Tenderness present.  Lymphadenopathy:    He has no cervical adenopathy.  Neurological: He is alert and oriented to person, place, and time. A sensory deficit (DM neuropathy) is present.  Skin: Skin is warm and dry.  Psychiatric: He has a normal mood and affect.     Labs:  Estimated body mass index is 39.6 kg/m as calculated from the following:   Height as of 08/16/19: 5' 9.75" (1.772 m).   Weight as of 08/17/19: 124.3 kg.  Imaging Review Plain radiographs demonstrate spacer in the left knee.  The bone quality appears to be good for age and reported activity level.    Assessment/Plan:  S/P right TK resection and placement of antibiotic spacer    The patient history, physical examination, clinical judgment of the provider and imaging studies are consistent with s/p TK resection of the right knee(s), previous total knee arthroplasty. Revision/reimplantation total knee arthroplasty is deemed medically necessary. The treatment options including medical management, injection therapy, arthroscopy and revision arthroplasty were discussed at length. The risks and benefits of revision total knee arthroplasty were presented and reviewed. The risks due to aseptic loosening, infection, stiffness, patella tracking problems, thromboembolic complications and other imponderables were discussed. The patient acknowledged the explanation, agreed to proceed with the plan and consent was signed. Patient is being admitted for inpatient treatment for surgery, pain control, PT, OT, prophylactic antibiotics, VTE  prophylaxis, progressive ambulation and ADL's and discharge planning.The patient is planning to be discharged home.    West Pugh Tag Wurtz   PA-C  08/21/2019, 8:02 AM

## 2019-08-22 ENCOUNTER — Ambulatory Visit (HOSPITAL_COMMUNITY): Payer: 59 | Admitting: Physician Assistant

## 2019-08-22 ENCOUNTER — Other Ambulatory Visit: Payer: Self-pay

## 2019-08-22 ENCOUNTER — Encounter (HOSPITAL_COMMUNITY)
Admission: RE | Disposition: A | Discharge status: Home / Self Care | Payer: Self-pay | Source: Home / Self Care | Attending: Orthopedic Surgery

## 2019-08-22 ENCOUNTER — Encounter (HOSPITAL_COMMUNITY): Payer: Self-pay | Admitting: Orthopedic Surgery

## 2019-08-22 ENCOUNTER — Observation Stay (HOSPITAL_COMMUNITY)
Admission: RE | Admit: 2019-08-22 | Discharge: 2019-08-23 | Disposition: A | Discharge status: Home / Self Care | Payer: 59 | Attending: Orthopedic Surgery | Admitting: Orthopedic Surgery

## 2019-08-22 DIAGNOSIS — M6281 Muscle weakness (generalized): Secondary | ICD-10-CM | POA: Diagnosis not present

## 2019-08-22 DIAGNOSIS — K5790 Diverticulosis of intestine, part unspecified, without perforation or abscess without bleeding: Secondary | ICD-10-CM | POA: Diagnosis not present

## 2019-08-22 DIAGNOSIS — I10 Essential (primary) hypertension: Secondary | ICD-10-CM | POA: Insufficient documentation

## 2019-08-22 DIAGNOSIS — G4733 Obstructive sleep apnea (adult) (pediatric): Secondary | ICD-10-CM | POA: Diagnosis not present

## 2019-08-22 DIAGNOSIS — R262 Difficulty in walking, not elsewhere classified: Secondary | ICD-10-CM | POA: Insufficient documentation

## 2019-08-22 DIAGNOSIS — Z96653 Presence of artificial knee joint, bilateral: Secondary | ICD-10-CM | POA: Insufficient documentation

## 2019-08-22 DIAGNOSIS — L4050 Arthropathic psoriasis, unspecified: Secondary | ICD-10-CM | POA: Insufficient documentation

## 2019-08-22 DIAGNOSIS — E669 Obesity, unspecified: Secondary | ICD-10-CM | POA: Diagnosis not present

## 2019-08-22 DIAGNOSIS — Z79899 Other long term (current) drug therapy: Secondary | ICD-10-CM | POA: Diagnosis not present

## 2019-08-22 DIAGNOSIS — Z6839 Body mass index (BMI) 39.0-39.9, adult: Secondary | ICD-10-CM | POA: Insufficient documentation

## 2019-08-22 DIAGNOSIS — Z7901 Long term (current) use of anticoagulants: Secondary | ICD-10-CM | POA: Diagnosis not present

## 2019-08-22 DIAGNOSIS — T8453XA Infection and inflammatory reaction due to internal right knee prosthesis, initial encounter: Principal | ICD-10-CM | POA: Insufficient documentation

## 2019-08-22 DIAGNOSIS — D45 Polycythemia vera: Secondary | ICD-10-CM | POA: Diagnosis not present

## 2019-08-22 DIAGNOSIS — K219 Gastro-esophageal reflux disease without esophagitis: Secondary | ICD-10-CM | POA: Insufficient documentation

## 2019-08-22 DIAGNOSIS — E785 Hyperlipidemia, unspecified: Secondary | ICD-10-CM | POA: Diagnosis not present

## 2019-08-22 DIAGNOSIS — X58XXXA Exposure to other specified factors, initial encounter: Secondary | ICD-10-CM | POA: Diagnosis not present

## 2019-08-22 DIAGNOSIS — Y792 Prosthetic and other implants, materials and accessory orthopedic devices associated with adverse incidents: Secondary | ICD-10-CM | POA: Diagnosis not present

## 2019-08-22 DIAGNOSIS — Z8673 Personal history of transient ischemic attack (TIA), and cerebral infarction without residual deficits: Secondary | ICD-10-CM | POA: Insufficient documentation

## 2019-08-22 DIAGNOSIS — Z96651 Presence of right artificial knee joint: Secondary | ICD-10-CM | POA: Diagnosis present

## 2019-08-22 DIAGNOSIS — E1121 Type 2 diabetes mellitus with diabetic nephropathy: Secondary | ICD-10-CM | POA: Insufficient documentation

## 2019-08-22 DIAGNOSIS — I4811 Longstanding persistent atrial fibrillation: Secondary | ICD-10-CM | POA: Diagnosis not present

## 2019-08-22 DIAGNOSIS — Z4733 Aftercare following explantation of knee joint prosthesis: Secondary | ICD-10-CM | POA: Diagnosis not present

## 2019-08-22 DIAGNOSIS — G8918 Other acute postprocedural pain: Secondary | ICD-10-CM | POA: Diagnosis not present

## 2019-08-22 DIAGNOSIS — E119 Type 2 diabetes mellitus without complications: Secondary | ICD-10-CM | POA: Diagnosis not present

## 2019-08-22 HISTORY — PX: REIMPLANTATION OF TOTAL KNEE: SHX6052

## 2019-08-22 LAB — GLUCOSE, CAPILLARY
Glucose-Capillary: 120 mg/dL — ABNORMAL HIGH (ref 70–99)
Glucose-Capillary: 134 mg/dL — ABNORMAL HIGH (ref 70–99)
Glucose-Capillary: 158 mg/dL — ABNORMAL HIGH (ref 70–99)
Glucose-Capillary: 168 mg/dL — ABNORMAL HIGH (ref 70–99)

## 2019-08-22 LAB — TYPE AND SCREEN
ABO/RH(D): O POS
Antibody Screen: NEGATIVE

## 2019-08-22 SURGERY — REVISION, TOTAL ARTHROPLASTY, KNEE
Anesthesia: Spinal | Site: Knee | Laterality: Right

## 2019-08-22 MED ORDER — ROSUVASTATIN CALCIUM 10 MG PO TABS
10.0000 mg | ORAL_TABLET | Freq: Every day | ORAL | Status: DC
  Administered 2019-08-23: 10 mg via ORAL
  Filled 2019-08-22: qty 1

## 2019-08-22 MED ORDER — CANAGLIFLOZIN 100 MG PO TABS
100.0000 mg | ORAL_TABLET | Freq: Every day | ORAL | Status: DC
  Administered 2019-08-23: 100 mg via ORAL
  Filled 2019-08-22: qty 1

## 2019-08-22 MED ORDER — ROPIVACAINE HCL 5 MG/ML IJ SOLN
INTRAMUSCULAR | Status: DC | PRN
  Administered 2019-08-22: 20 mL via PERINEURAL

## 2019-08-22 MED ORDER — PHENYLEPHRINE 40 MCG/ML (10ML) SYRINGE FOR IV PUSH (FOR BLOOD PRESSURE SUPPORT)
PREFILLED_SYRINGE | INTRAVENOUS | Status: AC
  Filled 2019-08-22: qty 20

## 2019-08-22 MED ORDER — DOCUSATE SODIUM 100 MG PO CAPS
100.0000 mg | ORAL_CAPSULE | Freq: Two times a day (BID) | ORAL | Status: DC
  Administered 2019-08-22 – 2019-08-23 (×2): 100 mg via ORAL
  Filled 2019-08-22 (×2): qty 1

## 2019-08-22 MED ORDER — CHLORHEXIDINE GLUCONATE 4 % EX LIQD: 60.0000 mL | Freq: Once | CUTANEOUS | Status: DC

## 2019-08-22 MED ORDER — METOCLOPRAMIDE HCL 5 MG PO TABS: 5.0000 mg | ORAL_TABLET | Freq: Three times a day (TID) | ORAL | Status: DC | PRN

## 2019-08-22 MED ORDER — LACTATED RINGERS IV SOLN: INTRAVENOUS | Status: DC

## 2019-08-22 MED ORDER — FERROUS SULFATE 325 (65 FE) MG PO TABS
325.0000 mg | ORAL_TABLET | Freq: Two times a day (BID) | ORAL | Status: DC
  Administered 2019-08-22 – 2019-08-23 (×2): 325 mg via ORAL
  Filled 2019-08-22 (×2): qty 1

## 2019-08-22 MED ORDER — ACETAMINOPHEN 325 MG PO TABS: 325.0000 mg | ORAL_TABLET | Freq: Four times a day (QID) | ORAL | Status: DC | PRN

## 2019-08-22 MED ORDER — ONDANSETRON HCL 4 MG/2ML IJ SOLN
INTRAMUSCULAR | Status: DC | PRN
  Administered 2019-08-22: 4 mg via INTRAVENOUS

## 2019-08-22 MED ORDER — INSULIN ASPART 100 UNIT/ML ~~LOC~~ SOLN
0.0000 [IU] | Freq: Three times a day (TID) | SUBCUTANEOUS | Status: DC
  Administered 2019-08-22: 3 [IU] via SUBCUTANEOUS
  Administered 2019-08-23: 09:00:00 2 [IU] via SUBCUTANEOUS

## 2019-08-22 MED ORDER — SODIUM CHLORIDE (PF) 0.9 % IJ SOLN
INTRAMUSCULAR | Status: AC
  Filled 2019-08-22: qty 50

## 2019-08-22 MED ORDER — BUPIVACAINE IN DEXTROSE 0.75-8.25 % IT SOLN
INTRATHECAL | Status: DC | PRN
  Administered 2019-08-22: 2 mL via INTRATHECAL

## 2019-08-22 MED ORDER — HYDROCODONE-ACETAMINOPHEN 7.5-325 MG PO TABS
1.0000 | ORAL_TABLET | ORAL | Status: DC | PRN
  Administered 2019-08-22 – 2019-08-23 (×2): 2 via ORAL
  Administered 2019-08-23: 1 via ORAL
  Filled 2019-08-22: qty 2
  Filled 2019-08-22: qty 1
  Filled 2019-08-22 (×2): qty 2

## 2019-08-22 MED ORDER — CELECOXIB 200 MG PO CAPS
200.0000 mg | ORAL_CAPSULE | Freq: Two times a day (BID) | ORAL | Status: DC
  Administered 2019-08-22 – 2019-08-23 (×2): 200 mg via ORAL
  Filled 2019-08-22 (×2): qty 1

## 2019-08-22 MED ORDER — INSULIN ASPART 100 UNIT/ML ~~LOC~~ SOLN: 0.0000 [IU] | Freq: Every day | SUBCUTANEOUS | Status: DC

## 2019-08-22 MED ORDER — SODIUM CHLORIDE 0.9 % IR SOLN
Status: DC | PRN
  Administered 2019-08-22: 3000 mL
  Administered 2019-08-22: 1000 mL

## 2019-08-22 MED ORDER — TRANEXAMIC ACID-NACL 1000-0.7 MG/100ML-% IV SOLN
1000.0000 mg | INTRAVENOUS | Status: AC
  Administered 2019-08-22: 12:00:00 1000 mg via INTRAVENOUS
  Filled 2019-08-22: qty 100

## 2019-08-22 MED ORDER — FENTANYL CITRATE (PF) 100 MCG/2ML IJ SOLN
50.0000 ug | INTRAMUSCULAR | Status: DC
  Administered 2019-08-22: 10:00:00 100 ug via INTRAVENOUS
  Filled 2019-08-22: qty 2

## 2019-08-22 MED ORDER — CEFAZOLIN SODIUM-DEXTROSE 2-4 GM/100ML-% IV SOLN
2.0000 g | Freq: Four times a day (QID) | INTRAVENOUS | Status: AC
  Administered 2019-08-22 (×2): 2 g via INTRAVENOUS
  Filled 2019-08-22 (×2): qty 100

## 2019-08-22 MED ORDER — CARVEDILOL 25 MG PO TABS
25.0000 mg | ORAL_TABLET | Freq: Two times a day (BID) | ORAL | Status: DC
  Administered 2019-08-22 – 2019-08-23 (×2): 25 mg via ORAL
  Filled 2019-08-22 (×2): qty 1

## 2019-08-22 MED ORDER — DIPHENHYDRAMINE HCL 12.5 MG/5ML PO ELIX: 12.5000 mg | ORAL_SOLUTION | ORAL | Status: DC | PRN

## 2019-08-22 MED ORDER — METHOCARBAMOL 500 MG IVPB - SIMPLE MED
500.0000 mg | Freq: Four times a day (QID) | INTRAVENOUS | Status: DC | PRN
  Filled 2019-08-22: qty 50

## 2019-08-22 MED ORDER — PROPOFOL 500 MG/50ML IV EMUL
INTRAVENOUS | Status: AC
  Filled 2019-08-22: qty 150

## 2019-08-22 MED ORDER — OXYCODONE HCL 5 MG/5ML PO SOLN: 5.0000 mg | Freq: Once | ORAL | Status: DC | PRN

## 2019-08-22 MED ORDER — TRANEXAMIC ACID-NACL 1000-0.7 MG/100ML-% IV SOLN
1000.0000 mg | Freq: Once | INTRAVENOUS | Status: AC
  Administered 2019-08-22: 1000 mg via INTRAVENOUS
  Filled 2019-08-22: qty 100

## 2019-08-22 MED ORDER — BUPIVACAINE HCL 0.25 % IJ SOLN
INTRAMUSCULAR | Status: AC
  Filled 2019-08-22: qty 1

## 2019-08-22 MED ORDER — MIDAZOLAM HCL 2 MG/2ML IJ SOLN
1.0000 mg | INTRAMUSCULAR | Status: DC
  Administered 2019-08-22 (×2): 2 mg via INTRAVENOUS
  Filled 2019-08-22: qty 2

## 2019-08-22 MED ORDER — METOCLOPRAMIDE HCL 5 MG/ML IJ SOLN: 5.0000 mg | Freq: Three times a day (TID) | INTRAMUSCULAR | Status: DC | PRN

## 2019-08-22 MED ORDER — DEXAMETHASONE SODIUM PHOSPHATE 10 MG/ML IJ SOLN
10.0000 mg | Freq: Once | INTRAMUSCULAR | Status: AC
  Administered 2019-08-22: 11:00:00 5 mg via INTRAVENOUS

## 2019-08-22 MED ORDER — PROMETHAZINE HCL 25 MG/ML IJ SOLN: 6.2500 mg | INTRAMUSCULAR | Status: DC | PRN

## 2019-08-22 MED ORDER — KETOROLAC TROMETHAMINE 30 MG/ML IJ SOLN
INTRAMUSCULAR | Status: AC
  Filled 2019-08-22: qty 1

## 2019-08-22 MED ORDER — VANCOMYCIN HCL 1000 MG IV SOLR
INTRAVENOUS | Status: AC
  Filled 2019-08-22: qty 3000

## 2019-08-22 MED ORDER — PROPOFOL 10 MG/ML IV BOLUS
INTRAVENOUS | Status: DC | PRN
  Administered 2019-08-22: 75 ug/kg/min via INTRAVENOUS
  Administered 2019-08-22: 40 mg via INTRAVENOUS

## 2019-08-22 MED ORDER — TOBRAMYCIN SULFATE 1.2 G IJ SOLR
INTRAMUSCULAR | Status: AC
  Filled 2019-08-22: qty 2.4

## 2019-08-22 MED ORDER — STERILE WATER FOR IRRIGATION IR SOLN
Status: DC | PRN
  Administered 2019-08-22: 2000 mL

## 2019-08-22 MED ORDER — HYDROCODONE-ACETAMINOPHEN 5-325 MG PO TABS
1.0000 | ORAL_TABLET | ORAL | Status: DC | PRN
  Administered 2019-08-22: 1 via ORAL
  Filled 2019-08-22: qty 1

## 2019-08-22 MED ORDER — PROPOFOL 10 MG/ML IV BOLUS
INTRAVENOUS | Status: AC
  Filled 2019-08-22: qty 20

## 2019-08-22 MED ORDER — SODIUM CHLORIDE (PF) 0.9 % IJ SOLN
INTRAMUSCULAR | Status: DC | PRN
  Administered 2019-08-22: 30 mL

## 2019-08-22 MED ORDER — POLYETHYLENE GLYCOL 3350 17 G PO PACK
17.0000 g | PACK | Freq: Two times a day (BID) | ORAL | Status: DC
  Administered 2019-08-22: 17 g via ORAL
  Filled 2019-08-22 (×2): qty 1

## 2019-08-22 MED ORDER — TOBRAMYCIN SULFATE 1.2 G IJ SOLR
INTRAMUSCULAR | Status: DC | PRN
  Administered 2019-08-22: 2.4 g

## 2019-08-22 MED ORDER — BISACODYL 10 MG RE SUPP: 10.0000 mg | Freq: Every day | RECTAL | Status: DC | PRN

## 2019-08-22 MED ORDER — SACUBITRIL-VALSARTAN 24-26 MG PO TABS
1.0000 | ORAL_TABLET | Freq: Two times a day (BID) | ORAL | Status: DC
  Administered 2019-08-22 – 2019-08-23 (×2): 1 via ORAL
  Filled 2019-08-22 (×2): qty 1

## 2019-08-22 MED ORDER — ALUM & MAG HYDROXIDE-SIMETH 200-200-20 MG/5ML PO SUSP: 15.0000 mL | ORAL | Status: DC | PRN

## 2019-08-22 MED ORDER — SODIUM CHLORIDE 0.9 % IR SOLN
Status: DC | PRN
  Administered 2019-08-22: 1000 mL

## 2019-08-22 MED ORDER — PHENOL 1.4 % MT LIQD
1.0000 | OROMUCOSAL | Status: DC | PRN
  Filled 2019-08-22: qty 177

## 2019-08-22 MED ORDER — MAGNESIUM CITRATE PO SOLN: 1.0000 | Freq: Once | ORAL | Status: DC | PRN

## 2019-08-22 MED ORDER — RIVAROXABAN 10 MG PO TABS
10.0000 mg | ORAL_TABLET | ORAL | Status: DC
  Administered 2019-08-23: 10 mg via ORAL
  Filled 2019-08-22: qty 1

## 2019-08-22 MED ORDER — POVIDONE-IODINE 10 % EX SWAB
2.0000 "application " | Freq: Once | CUTANEOUS | Status: AC
  Administered 2019-08-22: 2 via TOPICAL

## 2019-08-22 MED ORDER — ONDANSETRON HCL 4 MG/2ML IJ SOLN: 4.0000 mg | Freq: Four times a day (QID) | INTRAMUSCULAR | Status: DC | PRN

## 2019-08-22 MED ORDER — PANTOPRAZOLE SODIUM 40 MG PO TBEC
40.0000 mg | DELAYED_RELEASE_TABLET | Freq: Every day | ORAL | Status: DC
  Administered 2019-08-23: 09:00:00 40 mg via ORAL
  Filled 2019-08-22: qty 1

## 2019-08-22 MED ORDER — MIDAZOLAM HCL 2 MG/2ML IJ SOLN
INTRAMUSCULAR | Status: AC
  Filled 2019-08-22: qty 2

## 2019-08-22 MED ORDER — KETOROLAC TROMETHAMINE 30 MG/ML IJ SOLN
INTRAMUSCULAR | Status: DC | PRN
  Administered 2019-08-22: 30 mg

## 2019-08-22 MED ORDER — DEXAMETHASONE SODIUM PHOSPHATE 10 MG/ML IJ SOLN
10.0000 mg | Freq: Once | INTRAMUSCULAR | Status: AC
  Administered 2019-08-23: 10 mg via INTRAVENOUS
  Filled 2019-08-22: qty 1

## 2019-08-22 MED ORDER — ONDANSETRON HCL 4 MG PO TABS: 4.0000 mg | ORAL_TABLET | Freq: Four times a day (QID) | ORAL | Status: DC | PRN

## 2019-08-22 MED ORDER — SODIUM CHLORIDE 0.9 % IV SOLN: INTRAVENOUS | Status: DC

## 2019-08-22 MED ORDER — OXYMETAZOLINE HCL 0.05 % NA SOLN
1.0000 | Freq: Every day | NASAL | Status: DC
  Administered 2019-08-22: 1 via NASAL
  Filled 2019-08-22 (×2): qty 15

## 2019-08-22 MED ORDER — VANCOMYCIN HCL 1 G IV SOLR
INTRAVENOUS | Status: DC | PRN
  Administered 2019-08-22: 1000 mg
  Administered 2019-08-22: 1000 mg via TOPICAL

## 2019-08-22 MED ORDER — MENTHOL 3 MG MT LOZG
1.0000 | LOZENGE | OROMUCOSAL | Status: DC | PRN
  Filled 2019-08-22: qty 9

## 2019-08-22 MED ORDER — METHOCARBAMOL 500 MG PO TABS
500.0000 mg | ORAL_TABLET | Freq: Four times a day (QID) | ORAL | Status: DC | PRN
  Administered 2019-08-22 – 2019-08-23 (×3): 500 mg via ORAL
  Filled 2019-08-22 (×3): qty 1

## 2019-08-22 MED ORDER — OXYCODONE HCL 5 MG PO TABS: 5.0000 mg | ORAL_TABLET | Freq: Once | ORAL | Status: DC | PRN

## 2019-08-22 MED ORDER — FENTANYL CITRATE (PF) 100 MCG/2ML IJ SOLN
25.0000 ug | INTRAMUSCULAR | Status: DC | PRN
  Administered 2019-08-22: 25 ug via INTRAVENOUS
  Filled 2019-08-22: qty 2

## 2019-08-22 MED ORDER — BUPIVACAINE HCL (PF) 0.25 % IJ SOLN
INTRAMUSCULAR | Status: DC | PRN
  Administered 2019-08-22: 30 mL

## 2019-08-22 MED ORDER — PHENYLEPHRINE HCL-NACL 10-0.9 MG/250ML-% IV SOLN
INTRAVENOUS | Status: DC | PRN
  Administered 2019-08-22: 10 ug/min via INTRAVENOUS

## 2019-08-22 MED ORDER — HYDROMORPHONE HCL 1 MG/ML IJ SOLN: 0.2500 mg | INTRAMUSCULAR | Status: DC | PRN

## 2019-08-22 SURGICAL SUPPLY — 75 items
ATTUNE MED ANAT PAT 35 KNEE (Knees) ×1 IMPLANT
ATTUNE PSRP INSR SZ6 10 KNEE (Insert) ×1 IMPLANT
BAG ZIPLOCK 12X15 (MISCELLANEOUS) ×2 IMPLANT
BANDAGE ESMARK 6X9 LF (GAUZE/BANDAGES/DRESSINGS) ×1 IMPLANT
BLADE SAW SGTL 11.0X1.19X90.0M (BLADE) ×1 IMPLANT
BLADE SAW SGTL 13.0X1.19X90.0M (BLADE) ×2 IMPLANT
BLADE SAW SGTL 81X20 HD (BLADE) ×2 IMPLANT
BNDG ELASTIC 6X10 VLCR STRL LF (GAUZE/BANDAGES/DRESSINGS) ×1 IMPLANT
BNDG ELASTIC 6X5.8 VLCR STR LF (GAUZE/BANDAGES/DRESSINGS) ×2 IMPLANT
BNDG ESMARK 6X9 LF (GAUZE/BANDAGES/DRESSINGS)
BOWL SMART MIX CTS (DISPOSABLE) ×1 IMPLANT
BRUSH FEMORAL CANAL (MISCELLANEOUS) ×1 IMPLANT
CEMENT HV SMART SET (Cement) ×2 IMPLANT
COVER SURGICAL LIGHT HANDLE (MISCELLANEOUS) ×2 IMPLANT
COVER WAND RF STERILE (DRAPES) IMPLANT
CUFF TOURN SGL QUICK 34 (TOURNIQUET CUFF) ×1
CUFF TRNQT CYL 34X4.125X (TOURNIQUET CUFF) ×1 IMPLANT
DERMABOND ADVANCED (GAUZE/BANDAGES/DRESSINGS) ×1
DERMABOND ADVANCED .7 DNX12 (GAUZE/BANDAGES/DRESSINGS) IMPLANT
DRAPE POUCH INSTRU U-SHP 10X18 (DRAPES) ×2 IMPLANT
DRAPE SHEET LG 3/4 BI-LAMINATE (DRAPES) ×2 IMPLANT
DRAPE U-SHAPE 47X51 STRL (DRAPES) ×4 IMPLANT
DRESSING AQUACEL AG SP 3.5X10 (GAUZE/BANDAGES/DRESSINGS) IMPLANT
DRSG AQUACEL AG ADV 3.5X14 (GAUZE/BANDAGES/DRESSINGS) ×1 IMPLANT
DRSG AQUACEL AG SP 3.5X10 (GAUZE/BANDAGES/DRESSINGS) ×2
DRSG PAD ABDOMINAL 8X10 ST (GAUZE/BANDAGES/DRESSINGS) ×1 IMPLANT
DURAPREP 26ML APPLICATOR (WOUND CARE) ×3 IMPLANT
ELECT REM PT RETURN 15FT ADLT (MISCELLANEOUS) ×2 IMPLANT
FACESHIELD WRAPAROUND (MASK) ×10 IMPLANT
FACESHIELD WRAPAROUND OR TEAM (MASK) ×5 IMPLANT
FEM KNEE ATTUNE REV CRS SZ6 RT (Orthopedic Implant) ×2 IMPLANT
FEMORAL KNE ATTN REV CRS SZ6RT (Orthopedic Implant) IMPLANT
GLOVE BIOGEL M 7.0 STRL (GLOVE) IMPLANT
GLOVE BIOGEL PI IND STRL 7.5 (GLOVE) ×1 IMPLANT
GLOVE BIOGEL PI IND STRL 8.5 (GLOVE) ×1 IMPLANT
GLOVE BIOGEL PI INDICATOR 7.5 (GLOVE)
GLOVE BIOGEL PI INDICATOR 8.5 (GLOVE) ×1
GLOVE ECLIPSE 8.0 STRL XLNG CF (GLOVE) ×2 IMPLANT
GLOVE ORTHO TXT STRL SZ7.5 (GLOVE) ×4 IMPLANT
GOWN STRL REUS W/TWL LRG LVL3 (GOWN DISPOSABLE) ×2 IMPLANT
GOWN STRL REUS W/TWL XL LVL3 (GOWN DISPOSABLE) ×2 IMPLANT
HANDPIECE INTERPULSE COAX TIP (DISPOSABLE) ×1
HOLDER FOLEY CATH W/STRAP (MISCELLANEOUS) ×2 IMPLANT
JET LAVAGE IRRISEPT WOUND (IRRIGATION / IRRIGATOR) ×2
KIT TURNOVER KIT A (KITS) IMPLANT
LAVAGE JET IRRISEPT WOUND (IRRIGATION / IRRIGATOR) IMPLANT
MANIFOLD NEPTUNE II (INSTRUMENTS) ×2 IMPLANT
NDL SAFETY ECLIPSE 18X1.5 (NEEDLE) ×1 IMPLANT
NEEDLE HYPO 18GX1.5 SHARP (NEEDLE) ×1
NS IRRIG 1000ML POUR BTL (IV SOLUTION) ×3 IMPLANT
PENCIL SMOKE EVACUATOR (MISCELLANEOUS) IMPLANT
PIN FIX SIGMA LCS THRD HI (PIN) ×1 IMPLANT
PLATE REV TIB BAS ROT SZ5 KNEE (Orthopedic Implant) IMPLANT
PROTECTOR NERVE ULNAR (MISCELLANEOUS) ×2 IMPLANT
REV TIB BASE ROT PLAT SZ5 KNEE (Orthopedic Implant) ×2 IMPLANT
SET HNDPC FAN SPRY TIP SCT (DISPOSABLE) ×1 IMPLANT
SET PAD KNEE POSITIONER (MISCELLANEOUS) ×2 IMPLANT
SLEEVE TIB ATTUNE FP 45 (Knees) ×1 IMPLANT
SPONGE LAP 18X18 RF (DISPOSABLE) ×1 IMPLANT
STAPLER VISISTAT 35W (STAPLE) IMPLANT
STEM STR ATTUNE PF 16X110 (Knees) ×1 IMPLANT
STEM STR ATTUNE PF 16X60 (Knees) ×1 IMPLANT
SUCTION FRAZIER HANDLE 12FR (TUBING) ×1
SUCTION TUBE FRAZIER 12FR DISP (TUBING) ×1 IMPLANT
SUT VIC AB 1 CT1 36 (SUTURE) ×6 IMPLANT
SUT VIC AB 2-0 CT1 27 (SUTURE) ×3
SUT VIC AB 2-0 CT1 TAPERPNT 27 (SUTURE) ×3 IMPLANT
SYR 3ML LL SCALE MARK (SYRINGE) ×2 IMPLANT
SYR 50ML LL SCALE MARK (SYRINGE) ×2 IMPLANT
TOWEL OR 17X26 10 PK STRL BLUE (TOWEL DISPOSABLE) ×1 IMPLANT
TOWER CARTRIDGE SMART MIX (DISPOSABLE) ×1 IMPLANT
TRAY FOLEY MTR SLVR 16FR STAT (SET/KITS/TRAYS/PACK) ×2 IMPLANT
WATER STERILE IRR 1000ML POUR (IV SOLUTION) ×4 IMPLANT
WRAP KNEE MAXI GEL POST OP (GAUZE/BANDAGES/DRESSINGS) ×2 IMPLANT
YANKAUER SUCT BULB TIP 10FT TU (MISCELLANEOUS) ×1 IMPLANT

## 2019-08-22 NOTE — Interval H&P Note (Signed)
History and Physical Interval Note:  08/22/2019 8:49 AM  [name].  has presented today for surgery, with the diagnosis of Status post resection of right total knee infection.  The various methods of treatment have been discussed with the patient and family. After consideration of risks, benefits and other options for treatment, the patient has consented to  Procedure(s) with comments: REIMPLANTATION OF TOTAL KNEE (Right) - 120 mins as a surgical intervention.  The patient's history has been reviewed, patient examined, no change in status, stable for surgery.  I have reviewed the patient's chart and labs.  Questions were answered to the patient's satisfaction.     Mauri Pole

## 2019-08-22 NOTE — Anesthesia Procedure Notes (Signed)
Anesthesia Regional Block: Adductor canal block   Pre-Anesthetic Checklist: ,, timeout performed, Correct Patient, Correct Site, Correct Laterality, Correct Procedure, Correct Position, site marked, Risks and benefits discussed,  Surgical consent,  Pre-op evaluation,  At surgeon's request and post-op pain management  Laterality: Right  Prep: chloraprep       Needles:  Injection technique: Single-shot  Needle Type: Stimiplex     Needle Length: 9cm  Needle Gauge: 21     Additional Needles:   Procedures:,,,, ultrasound used (permanent image in chart),,,,  Narrative:  Start time: 08/22/2019 10:02 AM End time: 08/22/2019 10:07 AM Injection made incrementally with aspirations every 5 mL.  Performed by: Personally  Anesthesiologist: Lynda Rainwater, MD

## 2019-08-22 NOTE — Progress Notes (Signed)
AssistedDr. Miller with right, ultrasound guided, adductor canal block. Side rails up, monitors on throughout procedure. See vital signs in flow sheet. Tolerated Procedure well.  

## 2019-08-22 NOTE — Anesthesia Preprocedure Evaluation (Signed)
Anesthesia Evaluation  Patient identified by MRN, date of birth, ID band Patient awake    Reviewed: Allergy & Precautions, NPO status , Patient's Chart, lab work & pertinent test results  Airway Mallampati: II  TM Distance: >3 FB Neck ROM: Full    Dental no notable dental hx.    Pulmonary sleep apnea ,    Pulmonary exam normal breath sounds clear to auscultation       Cardiovascular hypertension, Pt. on medications negative cardio ROS Normal cardiovascular exam Rhythm:Regular Rate:Normal     Neuro/Psych TIAnegative psych ROS   GI/Hepatic Neg liver ROS, GERD  ,  Endo/Other  negative endocrine ROSdiabetes, Type 2  Renal/GU Renal InsufficiencyRenal disease  negative genitourinary   Musculoskeletal  (+) Arthritis , Osteoarthritis,    Abdominal (+) + obese,   Peds negative pediatric ROS (+)  Hematology negative hematology ROS (+)   Anesthesia Other Findings   Reproductive/Obstetrics negative OB ROS                             Anesthesia Physical  Anesthesia Plan  ASA: III  Anesthesia Plan: Spinal   Post-op Pain Management:  Regional for Post-op pain   Induction: Intravenous  PONV Risk Score and Plan: 1 and Ondansetron and Treatment may vary due to age or medical condition  Airway Management Planned: Simple Face Mask  Additional Equipment:   Intra-op Plan:   Post-operative Plan:   Informed Consent: I have reviewed the patients History and Physical, chart, labs and discussed the procedure including the risks, benefits and alternatives for the proposed anesthesia with the patient or authorized representative who has indicated his/her understanding and acceptance.     Dental advisory given  Plan Discussed with: CRNA  Anesthesia Plan Comments: (See PAT note 06/09/2019, Konrad Felix, PA-C)        Anesthesia Quick Evaluation

## 2019-08-22 NOTE — Transfer of Care (Signed)
Immediate Anesthesia Transfer of Care Note  Patient: [name].  Procedure(s) Performed: Procedure(s) with comments: REIMPLANTATION OF TOTAL KNEE (Right) - 120 mins  Patient Location: PACU  Anesthesia Type:Spinal  Level of Consciousness:  sedated, patient cooperative and responds to stimulation  Airway & Oxygen Therapy:Patient Spontanous Breathing and Patient connected to face mask oxgen  Post-op Assessment:  Report given to PACU RN and Post -op Vital signs reviewed and stable  Post vital signs:  Reviewed and stable  Last Vitals:  Vitals:   08/22/19 1007 08/22/19 1405  BP:  105/68  Pulse:  66  Resp:  17  Temp:  37.1 C  SpO2: 0000000     Complications: No apparent anesthesia complications

## 2019-08-22 NOTE — Progress Notes (Signed)
Pt home CPAP machine inspected for damages/defects, none were found.  Pt to self administer CPAP when ready for bed.  RT to monitor and assess as needed.

## 2019-08-22 NOTE — Plan of Care (Signed)
Plan of care 

## 2019-08-22 NOTE — Anesthesia Postprocedure Evaluation (Signed)
Anesthesia Post Note  Patient: [name].  Procedure(s) Performed: REIMPLANTATION OF TOTAL KNEE (Right Knee)     Patient location during evaluation: PACU Anesthesia Type: Spinal Level of consciousness: awake and alert Pain management: pain level controlled Vital Signs Assessment: post-procedure vital signs reviewed and stable Respiratory status: spontaneous breathing, nonlabored ventilation and respiratory function stable Cardiovascular status: blood pressure returned to baseline and stable Postop Assessment: no apparent nausea or vomiting Anesthetic complications: no    Last Vitals:  Vitals:   08/22/19 1500 08/22/19 1526  BP: 114/77 120/78  Pulse: 78 (!) 48  Resp: 14 20  Temp: 37.1 C 36.8 C  SpO2: 98% 98%    Last Pain:  Vitals:   08/22/19 1526  TempSrc: Oral  PainSc:                  Lynda Rainwater

## 2019-08-22 NOTE — Discharge Instructions (Addendum)

## 2019-08-22 NOTE — Evaluation (Signed)
Physical Therapy Evaluation Patient Details Name: [name]. MRN: [id number] DOB: 1961-05-31 Today's Date: 08/22/2019   History of Present Illness  R TKA reimplantation; h/o R spacer placement 06/15/19  Clinical Impression  Pt is s/p TKA resulting in the deficits listed below (see PT Problem List). Pt ambulated 160' with RW with no loss of balance. Initiated TKA HEP. Good progress expected.  Pt will benefit from skilled PT to increase their independence and safety with mobility to allow discharge to the venue listed below.      Follow Up Recommendations Follow surgeon's recommendation for DC plan and follow-up therapies;Outpatient PT    Equipment Recommendations  None recommended by PT    Recommendations for Other Services       Precautions / Restrictions Precautions Precautions: Knee Precaution Booklet Issued: Yes (comment) Precaution Comments: reviewed no pillow under knee Restrictions Weight Bearing Restrictions: No Other Position/Activity Restrictions: WBAT      Mobility  Bed Mobility Overal bed mobility: Modified Independent             General bed mobility comments: HOB up, used belt looped around R foot as leg lifter (pt had been doing this at home PTA)  Transfers Overall transfer level: Modified independent Equipment used: Rolling walker (2 wheeled)             General transfer comment: VCs hand placement  Ambulation/Gait Ambulation/Gait assistance: Supervision Gait Distance (Feet): 160 Feet Assistive device: Rolling walker (2 wheeled) Gait Pattern/deviations: Step-through pattern;Decreased stride length Gait velocity: WFL   General Gait Details: steady, no loss of balance  Stairs            Wheelchair Mobility    Modified Rankin (Stroke Patients Only)       Balance Overall balance assessment: Modified Independent                                           Pertinent Vitals/Pain Pain Assessment:  0-10 Pain Score: 3  Pain Location: R knee Pain Descriptors / Indicators: Sore Pain Intervention(s): Limited activity within patient's tolerance;Monitored during session;Premedicated before session;Ice applied    Home Living Family/patient expects to be discharged to:: Private residence Living Arrangements: Spouse/significant other           Home Layout: Two level;Able to live on main level with bedroom/bathroom Home Equipment: Gilford Rile - 2 wheels;Bedside commode;Cane - single point;Grab bars - tub/shower      Prior Function Level of Independence: Independent               Hand Dominance        Extremity/Trunk Assessment   Upper Extremity Assessment Upper Extremity Assessment: Overall WFL for tasks assessed    Lower Extremity Assessment Lower Extremity Assessment: RLE deficits/detail RLE Deficits / Details: SLR +2/5, knee AAROM 10-40* RLE Sensation: WNL RLE Coordination: WNL    Cervical / Trunk Assessment Cervical / Trunk Assessment: Normal  Communication   Communication: No difficulties  Cognition Arousal/Alertness: Awake/alert Behavior During Therapy: WFL for tasks assessed/performed Overall Cognitive Status: Within Functional Limits for tasks assessed                                        General Comments      Exercises Total Joint Exercises Ankle Circles/Pumps: AROM;Both;10 reps;Supine Heel  Slides: AAROM;Right;10 reps;Supine Straight Leg Raises: AAROM;Right;5 reps;Supine   Assessment/Plan    PT Assessment Patient needs continued PT services  PT Problem List Decreased strength;Decreased range of motion;Decreased activity tolerance;Decreased mobility;Pain       PT Treatment Interventions DME instruction;Gait training;Therapeutic exercise;Therapeutic activities;Patient/family education    PT Goals (Current goals can be found in the Care Plan section)  Acute Rehab PT Goals Patient Stated Goal: golf, yard work PT Goal  Formulation: With patient/family Time For Goal Achievement: 08/29/19 Potential to Achieve Goals: Good    Frequency 7X/week   Barriers to discharge        Co-evaluation               AM-PAC PT "6 Clicks" Mobility  Outcome Measure Help needed turning from your back to your side while in a flat bed without using bedrails?: A Little Help needed moving from lying on your back to sitting on the side of a flat bed without using bedrails?: A Little Help needed moving to and from a bed to a chair (including a wheelchair)?: A Little Help needed standing up from a chair using your arms (e.g., wheelchair or bedside chair)?: A Little Help needed to walk in hospital room?: A Little Help needed climbing 3-5 steps with a railing? : A Lot 6 Click Score: 17    End of Session Equipment Utilized During Treatment: Gait belt Activity Tolerance: Patient tolerated treatment well;No increased pain Patient left: in bed;with call bell/phone within reach;with family/visitor present Nurse Communication: Mobility status PT Visit Diagnosis: Muscle weakness (generalized) (M62.81);Difficulty in walking, not elsewhere classified (R26.2);Pain Pain - Right/Left: Right Pain - part of body: Knee    Time: [id number] PT Time Calculation (min) (ACUTE ONLY): 58 min   Charges:   PT Evaluation $PT Eval Low Complexity: 1 Low PT Treatments $Gait Training: 8-22 mins $Therapeutic Exercise: 8-22 mins $Therapeutic Activity: 8-22 mins      Blondell Reveal Kistler PT 08/22/2019  Acute Rehabilitation Services Pager [phone] Office [phone]

## 2019-08-22 NOTE — Anesthesia Procedure Notes (Signed)
Spinal  Patient location during procedure: OR Start time: 08/22/2019 11:29 AM End time: 08/22/2019 11:29 AM Staffing Performed: resident/CRNA  Resident/CRNA: West Pugh, CRNA Preanesthetic Checklist Completed: patient identified, IV checked, site marked, risks and benefits discussed, surgical consent, monitors and equipment checked, pre-op evaluation and timeout performed Spinal Block Patient position: sitting Prep: DuraPrep Patient monitoring: heart rate, cardiac monitor, continuous pulse ox and blood pressure Approach: midline Location: L3-4 Injection technique: single-shot Needle Needle type: Sprotte  Needle gauge: 24 G Needle length: 9 cm Needle insertion depth: 8 cm Assessment Sensory level: T4 Additional Notes IV functioning, monitors applied to pt. Expiration date of kit checked and confirmed to be in date. Sterile prep and drape, hand hygiene and sterile gloved used. Pt was positioned and spine was prepped in sterile fashion. Skin was anesthetized with lidocaine. Free flow of clear CSF obtained prior to injecting local anesthetic into CSF x 1 attempt. Spinal needle aspirated freely following injection. Needle was carefully withdrawn, and pt tolerated procedure well. Loss of motor and sensory on exam post injection.

## 2019-08-23 ENCOUNTER — Encounter: Payer: Self-pay | Admitting: *Deleted

## 2019-08-23 DIAGNOSIS — E669 Obesity, unspecified: Secondary | ICD-10-CM | POA: Diagnosis not present

## 2019-08-23 DIAGNOSIS — T8453XA Infection and inflammatory reaction due to internal right knee prosthesis, initial encounter: Secondary | ICD-10-CM | POA: Diagnosis not present

## 2019-08-23 DIAGNOSIS — K219 Gastro-esophageal reflux disease without esophagitis: Secondary | ICD-10-CM | POA: Diagnosis not present

## 2019-08-23 DIAGNOSIS — Z79899 Other long term (current) drug therapy: Secondary | ICD-10-CM | POA: Diagnosis not present

## 2019-08-23 DIAGNOSIS — Z8673 Personal history of transient ischemic attack (TIA), and cerebral infarction without residual deficits: Secondary | ICD-10-CM | POA: Diagnosis not present

## 2019-08-23 DIAGNOSIS — E785 Hyperlipidemia, unspecified: Secondary | ICD-10-CM | POA: Diagnosis not present

## 2019-08-23 DIAGNOSIS — I10 Essential (primary) hypertension: Secondary | ICD-10-CM | POA: Diagnosis not present

## 2019-08-23 DIAGNOSIS — R262 Difficulty in walking, not elsewhere classified: Secondary | ICD-10-CM | POA: Diagnosis not present

## 2019-08-23 DIAGNOSIS — M6281 Muscle weakness (generalized): Secondary | ICD-10-CM | POA: Diagnosis not present

## 2019-08-23 LAB — CBC
HCT: 45.4 % (ref 39.0–52.0)
Hemoglobin: 15.4 g/dL (ref 13.0–17.0)
MCH: 29.4 pg (ref 26.0–34.0)
MCHC: 33.9 g/dL (ref 30.0–36.0)
MCV: 86.8 fL (ref 80.0–100.0)
Platelets: 144 10*3/uL — ABNORMAL LOW (ref 150–400)
RBC: 5.23 MIL/uL (ref 4.22–5.81)
RDW: 15.1 % (ref 11.5–15.5)
WBC: 11 10*3/uL — ABNORMAL HIGH (ref 4.0–10.5)
nRBC: 0 % (ref 0.0–0.2)

## 2019-08-23 LAB — BASIC METABOLIC PANEL
Anion gap: 9 (ref 5–15)
BUN: 25 mg/dL — ABNORMAL HIGH (ref 6–20)
CO2: 20 mmol/L — ABNORMAL LOW (ref 22–32)
Calcium: 8.7 mg/dL — ABNORMAL LOW (ref 8.9–10.3)
Chloride: 105 mmol/L (ref 98–111)
Creatinine, Ser: 0.98 mg/dL (ref 0.61–1.24)
GFR calc Af Amer: >60 mL/min (ref >60)
GFR calc non Af Amer: >60 mL/min (ref >60)
Glucose, Bld: 147 mg/dL — ABNORMAL HIGH (ref 70–99)
Potassium: 4.1 mmol/L (ref 3.5–5.1)
Sodium: 134 mmol/L — ABNORMAL LOW (ref 135–145)

## 2019-08-23 MED ORDER — FERROUS SULFATE 325 (65 FE) MG PO TABS: 325.0000 mg | ORAL_TABLET | Freq: Three times a day (TID) | ORAL | 0 refills | Status: DC

## 2019-08-23 MED ORDER — POLYETHYLENE GLYCOL 3350 17 G PO PACK: 17.0000 g | PACK | Freq: Two times a day (BID) | ORAL | 0 refills | Status: DC

## 2019-08-23 MED ORDER — DOCUSATE SODIUM 100 MG PO CAPS: 100.0000 mg | ORAL_CAPSULE | Freq: Two times a day (BID) | ORAL | 0 refills | Status: DC

## 2019-08-23 MED ORDER — METHOCARBAMOL 500 MG PO TABS: 500.0000 mg | ORAL_TABLET | Freq: Four times a day (QID) | ORAL | 0 refills | Status: DC | PRN

## 2019-08-23 MED ORDER — HYDROCODONE-ACETAMINOPHEN 7.5-325 MG PO TABS: 1.0000 | ORAL_TABLET | ORAL | 0 refills | Status: DC | PRN

## 2019-08-23 MED FILL — HYDROCODON-APAP 7.5-325: 7.5-325 | 5 days supply | Qty: 60 | Fill #0

## 2019-08-23 MED FILL — DOXYCYCLINE HYC 100 MG CAPS: 100 | 30 days supply | Qty: 60 | Fill #0

## 2019-08-23 MED FILL — METHOCARBAMOL 500 MG TABS: 500 | 10 days supply | Qty: 40 | Fill #0

## 2019-08-23 NOTE — Plan of Care (Signed)
Problem: Health Behavior/Discharge Planning: Goal: Ability to manage health-related needs will improve 08/23/2019 1940 by Deetta Perla, RN Outcome: Adequate for Discharge 08/23/2019 1136 by Deetta Perla, RN Outcome: Adequate for Discharge 08/23/2019 0910 by Deetta Perla, RN Outcome: Progressing   Problem: Clinical Measurements: Goal: Ability to maintain clinical measurements within normal limits will improve 08/23/2019 1940 by Deetta Perla, RN Outcome: Adequate for Discharge 08/23/2019 1136 by Deetta Perla, RN Outcome: Adequate for Discharge 08/23/2019 0910 by Deetta Perla, RN Outcome: Progressing Goal: Will remain free from infection 08/23/2019 1940 by Deetta Perla, RN Outcome: Adequate for Discharge 08/23/2019 1136 by Deetta Perla, RN Outcome: Adequate for Discharge 08/23/2019 0910 by Deetta Perla, RN Outcome: Progressing Goal: Diagnostic test results will improve 08/23/2019 1940 by Deetta Perla, RN Outcome: Adequate for Discharge 08/23/2019 1136 by Deetta Perla, RN Outcome: Adequate for Discharge 08/23/2019 0910 by Deetta Perla, RN Outcome: Progressing Goal: Respiratory complications will improve 08/23/2019 1940 by Deetta Perla, RN Outcome: Adequate for Discharge 08/23/2019 1136 by Deetta Perla, RN Outcome: Adequate for Discharge 08/23/2019 0910 by Deetta Perla, RN Outcome: Progressing Goal: Cardiovascular complication will be avoided 08/23/2019 1940 by Deetta Perla, RN Outcome: Adequate for Discharge 08/23/2019 1136 by Deetta Perla, RN Outcome: Adequate for Discharge 08/23/2019 0910 by Deetta Perla, RN Outcome: Progressing   Problem: Activity: Goal: Risk for activity intolerance will decrease 08/23/2019 1940 by Deetta Perla, RN Outcome: Adequate for Discharge 08/23/2019 1136 by Deetta Perla, RN Outcome: Adequate for Discharge 08/23/2019 0910 by Deetta Perla, RN Outcome: Progressing   Problem: Nutrition: Goal: Adequate nutrition will be maintained 08/23/2019 1940 by Deetta Perla, RN Outcome: Adequate for Discharge 08/23/2019 1136 by Deetta Perla, RN Outcome: Adequate for Discharge 08/23/2019 0910 by Deetta Perla, RN Outcome: Progressing   Problem: Coping: Goal: Level of anxiety will decrease 08/23/2019 1940 by Deetta Perla, RN Outcome: Adequate for Discharge 08/23/2019 1136 by Deetta Perla, RN Outcome: Adequate for Discharge 08/23/2019 0910 by Deetta Perla, RN Outcome: Progressing   Problem: Elimination: Goal: Will not experience complications related to bowel motility 08/23/2019 1940 by Deetta Perla, RN Outcome: Adequate for Discharge 08/23/2019 1136 by Deetta Perla, RN Outcome: Adequate for Discharge 08/23/2019 0910 by Deetta Perla, RN Outcome: Progressing Goal: Will not experience complications related to urinary retention 08/23/2019 1940 by Deetta Perla, RN Outcome: Adequate for Discharge 08/23/2019 1136 by Deetta Perla, RN Outcome: Adequate for Discharge 08/23/2019 0910 by Deetta Perla, RN Outcome: Progressing   Problem: Education: Goal: Knowledge of the prescribed therapeutic regimen will improve 08/23/2019 1940 by Deetta Perla, RN Outcome: Adequate for Discharge 08/23/2019 1136 by Deetta Perla, RN Outcome: Adequate for Discharge 08/23/2019 0910 by Deetta Perla, RN Outcome: Progressing Goal: Individualized Educational Video(s) 08/23/2019 1940 by Deetta Perla, RN Outcome: Adequate for Discharge 08/23/2019 1136 by Deetta Perla, RN Outcome: Adequate for Discharge 08/23/2019 0910 by Deetta Perla, RN Outcome: Progressing   Problem: Pain Managment: Goal: General experience of comfort will improve 08/23/2019 1940 by Deetta Perla, RN Outcome: Adequate for Discharge 08/23/2019 1136 by Deetta Perla, RN Outcome:  Adequate for Discharge 08/23/2019 0910 by Deetta Perla, RN Outcome: Progressing   Problem: Activity: Goal: Ability to avoid complications of mobility impairment will improve 08/23/2019 1940 by Deetta Perla, RN Outcome: Adequate for Discharge 08/23/2019 1136 by Annisha Baar, Helane Gunther,  RN Outcome: Adequate for Discharge 08/23/2019 0910 by Deetta Perla, RN Outcome: Progressing Goal: Range of joint motion will improve 08/23/2019 1940 by Deetta Perla, RN Outcome: Adequate for Discharge 08/23/2019 1136 by Deetta Perla, RN Outcome: Adequate for Discharge 08/23/2019 0910 by Deetta Perla, RN Outcome: Progressing   Problem: Clinical Measurements: Goal: Postoperative complications will be avoided or minimized 08/23/2019 1940 by Deetta Perla, RN Outcome: Adequate for Discharge 08/23/2019 1136 by Deetta Perla, RN Outcome: Adequate for Discharge 08/23/2019 0910 by Deetta Perla, RN Outcome: Progressing   Problem: Pain Management: Goal: Pain level will decrease with appropriate interventions 08/23/2019 1940 by Deetta Perla, RN Outcome: Adequate for Discharge 08/23/2019 1136 by Deetta Perla, RN Outcome: Adequate for Discharge 08/23/2019 0910 by Deetta Perla, RN Outcome: Progressing   Problem: Skin Integrity: Goal: Will show signs of wound healing 08/23/2019 1940 by Deetta Perla, RN Outcome: Adequate for Discharge 08/23/2019 1136 by Deetta Perla, RN Outcome: Adequate for Discharge 08/23/2019 0910 by Deetta Perla, RN Outcome: Progressing

## 2019-08-23 NOTE — Plan of Care (Signed)
  Problem: Health Behavior/Discharge Planning: Goal: Ability to manage health-related needs will improve 08/23/2019 1136 by Kashonda Sarkisyan, Helane Gunther, RN Outcome: Adequate for Discharge 08/23/2019 0910 by Deetta Perla, RN Outcome: Progressing   Problem: Clinical Measurements: Goal: Ability to maintain clinical measurements within normal limits will improve 08/23/2019 1136 by Tovah Slavick, Helane Gunther, RN Outcome: Adequate for Discharge 08/23/2019 0910 by Deetta Perla, RN Outcome: Progressing Goal: Will remain free from infection 08/23/2019 1136 by Deetta Perla, RN Outcome: Adequate for Discharge 08/23/2019 0910 by Deetta Perla, RN Outcome: Progressing Goal: Diagnostic test results will improve 08/23/2019 1136 by Deetta Perla, RN Outcome: Adequate for Discharge 08/23/2019 0910 by Deetta Perla, RN Outcome: Progressing Goal: Respiratory complications will improve 08/23/2019 1136 by Deetta Perla, RN Outcome: Adequate for Discharge 08/23/2019 0910 by Deetta Perla, RN Outcome: Progressing Goal: Cardiovascular complication will be avoided 08/23/2019 1136 by Deetta Perla, RN Outcome: Adequate for Discharge 08/23/2019 0910 by Deetta Perla, RN Outcome: Progressing   Problem: Activity: Goal: Risk for activity intolerance will decrease 08/23/2019 1136 by Amond Speranza, Helane Gunther, RN Outcome: Adequate for Discharge 08/23/2019 0910 by Deetta Perla, RN Outcome: Progressing   Problem: Nutrition: Goal: Adequate nutrition will be maintained 08/23/2019 1136 by Deetta Perla, RN Outcome: Adequate for Discharge 08/23/2019 0910 by Deetta Perla, RN Outcome: Progressing   Problem: Coping: Goal: Level of anxiety will decrease 08/23/2019 1136 by Deetta Perla, RN Outcome: Adequate for Discharge 08/23/2019 0910 by Deetta Perla, RN Outcome: Progressing   Problem: Elimination: Goal: Will not experience complications related to bowel  motility 08/23/2019 1136 by Deetta Perla, RN Outcome: Adequate for Discharge 08/23/2019 0910 by Deetta Perla, RN Outcome: Progressing Goal: Will not experience complications related to urinary retention 08/23/2019 1136 by Deetta Perla, RN Outcome: Adequate for Discharge 08/23/2019 0910 by Deetta Perla, RN Outcome: Progressing   Problem: Pain Managment: Goal: General experience of comfort will improve 08/23/2019 1136 by Deetta Perla, RN Outcome: Adequate for Discharge 08/23/2019 0910 by Deetta Perla, RN Outcome: Progressing   Problem: Education: Goal: Knowledge of the prescribed therapeutic regimen will improve 08/23/2019 1136 by Deetta Perla, RN Outcome: Adequate for Discharge 08/23/2019 0910 by Deetta Perla, RN Outcome: Progressing Goal: Individualized Educational Video(s) 08/23/2019 1136 by Deetta Perla, RN Outcome: Adequate for Discharge 08/23/2019 0910 by Deetta Perla, RN Outcome: Progressing   Problem: Activity: Goal: Ability to avoid complications of mobility impairment will improve 08/23/2019 1136 by Zania Kalisz, Helane Gunther, RN Outcome: Adequate for Discharge 08/23/2019 0910 by Deetta Perla, RN Outcome: Progressing Goal: Range of joint motion will improve 08/23/2019 1136 by Malcom Selmer, Helane Gunther, RN Outcome: Adequate for Discharge 08/23/2019 0910 by Deetta Perla, RN Outcome: Progressing   Problem: Clinical Measurements: Goal: Postoperative complications will be avoided or minimized 08/23/2019 1136 by Deetta Perla, RN Outcome: Adequate for Discharge 08/23/2019 0910 by Deetta Perla, RN Outcome: Progressing   Problem: Pain Management: Goal: Pain level will decrease with appropriate interventions 08/23/2019 1136 by Deetta Perla, RN Outcome: Adequate for Discharge 08/23/2019 0910 by Deetta Perla, RN Outcome: Progressing   Problem: Skin Integrity: Goal: Will show signs of wound  healing 08/23/2019 1136 by Deetta Perla, RN Outcome: Adequate for Discharge 08/23/2019 0910 by Deetta Perla, RN Outcome: Progressing

## 2019-08-23 NOTE — Plan of Care (Signed)
  Problem: Health Behavior/Discharge Planning: Goal: Ability to manage health-related needs will improve Outcome: Progressing   Problem: Clinical Measurements: Goal: Respiratory complications will improve Outcome: Progressing Goal: Cardiovascular complication will be avoided Outcome: Progressing   Problem: Activity: Goal: Risk for activity intolerance will decrease Outcome: Progressing   Problem: Coping: Goal: Level of anxiety will decrease Outcome: Progressing   Problem: Pain Managment: Goal: General experience of comfort will improve Outcome: Progressing   Problem: Education: Goal: Knowledge of the prescribed therapeutic regimen will improve Outcome: Progressing   Problem: Activity: Goal: Ability to avoid complications of mobility impairment will improve Outcome: Progressing Goal: Range of joint motion will improve Outcome: Progressing

## 2019-08-23 NOTE — Progress Notes (Signed)
     Subjective: 1 Day Post-Op Procedure(s) (LRB): REIMPLANTATION OF TOTAL KNEE (Right)    Seen by Dr. Alvan Dame. Patient reports pain as mild, controlled with medication.  No events throughout the night.  Dr. Alvan Dame discussed the procedure, findings and expectations moving forward.  Patient ready to be discharged home.  He will follow up in the clinic in 2 weeks.  He knows to call with any questions or concerns.    Objective:   VITALS:   Vitals:   08/23/19 0124 08/23/19 0603  BP: 133/88 129/81  Pulse: 60 75  Resp: 16   Temp: (!) 97.5 F (36.4 C) (!) 97.5 F (36.4 C)  SpO2: 97% 97%    Dorsiflexion/Plantar flexion intact Incision: dressing C/D/I No cellulitis present Compartment soft  LABS Recent Labs    08/23/19 0409  HGB 15.4  HCT 45.4  WBC 11.0*  PLT 144*    Recent Labs    08/23/19 0409  NA 134*  K 4.1  BUN 25*  CREATININE 0.98  GLUCOSE 147*     Assessment/Plan: 1 Day Post-Op Procedure(s) (LRB): REIMPLANTATION OF TOTAL KNEE (Right) Foley cath d/c'ed Advance diet Up with therapy D/C IV fluids Discharge home Follow up in 2 weeks at [facility] Follow up with OLIN,Therron Sells D in 2 weeks.  Contact information:  [facility] [address], [address] [phone]    Obese (BMI 30-39.9) Estimated body mass index is 37.77 kg/m as calculated from the following:   Height as of this encounter: 5\' 9"  (1.753 m).   Weight as of this encounter: 116 kg. Patient also counseled that weight may inhibit the healing process Patient counseled that losing weight will help with future health issues        West Pugh. Kendi Defalco   PAC  08/23/2019, 8:40 AM

## 2019-08-23 NOTE — Plan of Care (Signed)
  Problem: Health Behavior/Discharge Planning: Goal: Ability to manage health-related needs will improve Outcome: Progressing   Problem: Clinical Measurements: Goal: Ability to maintain clinical measurements within normal limits will improve Outcome: Progressing Goal: Will remain free from infection Outcome: Progressing Goal: Diagnostic test results will improve Outcome: Progressing Goal: Respiratory complications will improve Outcome: Progressing Goal: Cardiovascular complication will be avoided Outcome: Progressing   Problem: Activity: Goal: Risk for activity intolerance will decrease Outcome: Progressing   Problem: Elimination: Goal: Will not experience complications related to bowel motility Outcome: Progressing Goal: Will not experience complications related to urinary retention Outcome: Progressing   Problem: Pain Managment: Goal: General experience of comfort will improve Outcome: Progressing   Problem: Activity: Goal: Ability to avoid complications of mobility impairment will improve Outcome: Progressing Goal: Range of joint motion will improve Outcome: Progressing   Problem: Clinical Measurements: Goal: Postoperative complications will be avoided or minimized Outcome: Progressing   Problem: Pain Management: Goal: Pain level will decrease with appropriate interventions Outcome: Progressing   Problem: Skin Integrity: Goal: Will show signs of wound healing Outcome: Progressing

## 2019-08-23 NOTE — Care Management CC44 (Signed)
Condition Code 44 Documentation Completed  Patient Details  Name: [name]. MRN: [id number] Date of Birth: 1960/12/07   Condition Code 44 given:  Yes Patient signature on Condition Code 44 notice:  Yes Documentation of 2 MD's agreement:  Yes Code 44 added to claim:  Yes    Leeroy Cha, RN 08/23/2019, 10:14 AM

## 2019-08-23 NOTE — Care Management Obs Status (Signed)
[place] NOTIFICATION   Patient Details  Name: [name]. MRN: [id number] Date of Birth: [birth year]   Medicare Observation Status Notification Given:  Yes    Leeroy Cha, RN 08/23/2019, 10:13 AM

## 2019-08-23 NOTE — Progress Notes (Signed)
Physical Therapy Treatment Patient Details Name: [name]. MRN: [id number] DOB: Jan 12, 1961 Today's Date: 08/23/2019    History of Present Illness R TKA reimplantation; h/o R spacer placement 06/15/19    PT Comments    Pt ambulated in hallway and reports no increase in pain.  Pt feels ready for discharge home today.  Pt did not have any questions about exercises and able to verbalize safe stair technique (did not feel he needed to practice prior to leaving).  Pt with history of multiple knee surgeries and feels confident with discharge home today.   Follow Up Recommendations  Follow surgeon's recommendation for DC plan and follow-up therapies;Outpatient PT     Equipment Recommendations  None recommended by PT    Recommendations for Other Services       Precautions / Restrictions Precautions Precautions: Knee Restrictions Other Position/Activity Restrictions: WBAT    Mobility  Bed Mobility               General bed mobility comments: pt in recliner on arrival  Transfers Overall transfer level: Modified independent               General transfer comment: safe use of RW  Ambulation/Gait Ambulation/Gait assistance: Supervision Gait Distance (Feet): 120 Feet Assistive device: Rolling walker (2 wheeled) Gait Pattern/deviations: Step-through pattern;Decreased stride length     General Gait Details: cues for heel strike and allowing knee flexion, steady with RW   Stairs             Wheelchair Mobility    Modified Rankin (Stroke Patients Only)       Balance                                            Cognition Arousal/Alertness: Awake/alert Behavior During Therapy: WFL for tasks assessed/performed Overall Cognitive Status: Within Functional Limits for tasks assessed                                        Exercises      General Comments        Pertinent Vitals/Pain Pain Assessment:  0-10 Pain Score: 3  Pain Location: R knee Pain Descriptors / Indicators: Sore Pain Intervention(s): Repositioned;Monitored during session    Home Living                      Prior Function            PT Goals (current goals can now be found in the care plan section) Progress towards PT goals: Progressing toward goals    Frequency    7X/week      PT Plan Current plan remains appropriate    Co-evaluation              AM-PAC PT "6 Clicks" Mobility   Outcome Measure  Help needed turning from your back to your side while in a flat bed without using bedrails?: None Help needed moving from lying on your back to sitting on the side of a flat bed without using bedrails?: None Help needed moving to and from a bed to a chair (including a wheelchair)?: A Little Help needed standing up from a chair using your arms (e.g., wheelchair or bedside chair)?: A Little Help needed to  walk in hospital room?: A Little Help needed climbing 3-5 steps with a railing? : A Little 6 Click Score: 20    End of Session   Activity Tolerance: Patient tolerated treatment well Patient left: in chair;with call bell/phone within reach Nurse Communication: Mobility status PT Visit Diagnosis: Muscle weakness (generalized) (M62.81);Difficulty in walking, not elsewhere classified (R26.2)     Time: 0921-0929 PT Time Calculation (min) (ACUTE ONLY): 8 min  Charges:  $Gait Training: 8-22 mins                     Arlyce Dice, DPT Acute Rehabilitation Services Office: [phone]   Trena Platt 08/23/2019, 2:47 PM

## 2019-08-24 NOTE — Op Note (Signed)
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  At this point, now cleared for reimplantation  clinically.  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  At this point, now cleared for reimplantation  clinically.  PROCEDURE:  Reimplantation/revision right total knee arthroplasty.  COMPONENTS USED:  DePuy Attune revision knee system with a size 6 right femur with a size 16 x 110 mm press-fit stem, a size 5 tibia with a 45 mm press-fit sleeve and a 16 x 60 press-fit stem with a 35 patellar button and a size 10 mm posterior  stabilized insert.  SURGEON:  [name], MD  ASSISTANT:  Danae Orleans PA-C  Note that Ms. Babish was present for the entirety of the case from preoperative positioning, perioperative management of the operative extremity, general facilitation of the case and primary wound closure.  ANESTHESIA:  Regional plus spinal.  SPECIMENS:  None taken.  DRAINS:  None.  COMPLICATIONS:  None.  TOURNIQUET TIME:  72 minutes at 250 mmHg.  ESTIMATED BLOOD LOSS:  Less than 200 mL.  INDICATIONS FOR PROCEDURE:  The patient is a very pleasant 58 year old gentleman with longstanding challenges of treatment of this right knee over the past 18 months.  His primary total knee arthroplasty was uneventful until after his left total knee  replacement was performed.  He then developed acute onset of illness and fever and diagnosed with an infected right total knee.   He was taken to the operating room acutely based on the presentation and attempted I and D with subsequent antibiotics.  He  was diagnosed with streptococcus infection with hopes of curing this.  Unfortunately, this failed.  This ultimately led to a resection of his knee and placement of antibiotic spacer.  He was retreated with antibiotics.  After a course of 6 weeks of IV  antibiotics and a course of oral antibiotics, within a week of stopping oral antibiotics he had recurrence of his infection.  He then underwent a repeat excisional debridement, placement of an antibiotic-laden spacer, and repeat antibiotics at this time  with daptomycin.  Clinically, he had improved significantly following this procedure.  His most recent sedimentation rate and C-reactive protein were well within normal limits.  Surgery for reimplantation was planned.  Risks and benefits discussed that  he has been through this before.  Consent obtained.  PROCEDURE IN DETAIL:  The patient was brought to the operative theater.  Once adequate anesthesia, preoperative antibiotics, and Ancef administered, he was positioned supine with a right thigh tourniquet placed.  The right lower extremity was then  prepped and draped in sterile fashion.  Timeout was performed identifying the patient, the planned procedure and extremity.  Leg was exsanguinated, tourniquet elevated to 250 mmHg.  His old incision was excised.  Soft tissue planes created.  A median  arthrotomy was made.  When I got into the joint, we encountered blood-tinged synovial fluid.  There was no purulence.  His soft tissues all appeared to be normal as compared to our previous surgical intervention.  Once I had the knee exposed, we did a  complete synovectomy again medially, laterally, and within the suprapatellar pouch.  I then flexed the knee up with the patella subluxated laterally, and I removed the tibial and femoral components.  No bone loss occurred with this.  I then  debrided all  the soft tissues and fibrous tissue off the distal femur and proximal tibia.  I then prepared the femoral canal and tibial canal with reaming up to 16 mm on both sides.  I then revisited the proximal tibia with a proximal tibial cut using extramedullary guide, removing just a little bit of bone.  The cut surface of the tibia  seemed to be best fit with a size 5 tray.  I then broached up to 45 mm sleeve and then placed a trial component.  This trial component was a size 5 tibia with a 45 mm trial sleeve and a 16 x 60 trial stem.  This was utilized to set rotation of the femur.   The femoral preparation was carried out per protocol, again revisiting the distal cut, the anterior, posterior, and chamfer cuts.  No augments were deemed necessary.  The femur was determined to be a size 6.  At this point, we did trial reductions and  found that the knee was stable with a 10 mm insert.  Note that I did a trial with a slightly higher sleeve size on the tibia; however, due to flexion and extension gaps being basically nonexistent, we went down to the 45 mm sleeve.  Given the trial  reduction and with the trial components in place, I did drill holes into the patella for a 35 button.  His patella size has been diminished over the procedure time.  There was enough patella, however, to hold this component safely and securely.  Given  all these findings, the trial components were removed.  We irrigated the knee throughout the case including pulse lavage in the canals with a canal brush irrigator.  I also utilized Irrisept again in his wound and kept in the wound for greater than 5  minutes.  The final components were opened and configured on the back table under direct supervision.  Once the components were ready, I mixed 2 batches of cement with 2 g of vancomycin and 2.6 of tobramycin.  I utilized this on the femoral component as  the tibial side was press-fit.  I elected to do this due to concerns if there  were any other complications with the knee in terms of removal.  Once the cement was ready, the final components were placed in the knee, first with the tibial side and then  cementing the femoral component and the patella.  The knee was held in extension until the cement fully cured.  Once the cement cured, excessive cement was removed throughout the knee, and the final 10 mm insert to match the 6 femur was placed in the  tibia and the knee was reduced.  We irrigated the knee again.  Finally, the extensor mechanism was then reapproximated using a combination of #1 Vicryl and #1 Stratafix suture.  The remainder of the wound was closed with 2-0 Vicryl and a running Monocryl  stitch.  The knee was then cleaned, dried, and dressed sterilely using surgical glue and Aquacel dressing.  He was then brought to the recovery room in stable condition, tolerating the procedure well.  Findings were reviewed with his wife.   Postoperatively, we will have him be partial weightbearing on this right lower extremity to allow for  bony ingrowth.  Range of motion will be permitted based on intraoperative findings.  LN/NUANCE  D:08/24/2019 T:08/24/2019 JOB:[id number]

## 2019-08-24 NOTE — Brief Op Note (Signed)
08/22/2019  1:30PM  PATIENT:  [name].  58 y.o. male  PRE-OPERATIVE DIAGNOSIS:  Status post resection of right total knee infection  POST-OPERATIVE DIAGNOSIS:  Status post resection of right total knee infection  PROCEDURE:  Procedure(s) with comments: REIMPLANTATION OF TOTAL KNEE (Right) - 120 mins  SURGEON:  Surgeon(s) and Role:    [name], MD - Primary  PHYSICIAN ASSISTANT: Danae Orleans, PA-C  ANESTHESIA:   regional and spinal  EBL:  75 mL   BLOOD ADMINISTERED:none  DRAINS: none   LOCAL MEDICATIONS USED:  MARCAINE     SPECIMEN:  No Specimen  DISPOSITION OF SPECIMEN:  N/A  COUNTS:  YES  TOURNIQUET:   Total Tourniquet Time Documented: Thigh (Right) - 72 minutes Total: Thigh (Right) - 72 minutes   DICTATION: .Other Dictation: Dictation Number [id number]  PLAN OF CARE: Admit to inpatient   PATIENT DISPOSITION:  PACU - hemodynamically stable.   Delay start of Pharmacological VTE agent (>24hrs) due to surgical blood loss or risk of bleeding: no

## 2019-08-25 LAB — GLUCOSE, CAPILLARY
Glucose-Capillary: 171 mg/dL — ABNORMAL HIGH (ref 70–99)
Glucose-Capillary: 131 mg/dL — ABNORMAL HIGH (ref 70–99)

## 2019-08-28 ENCOUNTER — Ambulatory Visit: Payer: 59 | Attending: Orthopedic Surgery | Admitting: Physical Therapy

## 2019-08-28 ENCOUNTER — Other Ambulatory Visit: Payer: Self-pay

## 2019-08-28 ENCOUNTER — Encounter: Payer: Self-pay | Admitting: Physical Therapy

## 2019-08-28 DIAGNOSIS — Z9889 Other specified postprocedural states: Secondary | ICD-10-CM | POA: Insufficient documentation

## 2019-08-28 DIAGNOSIS — M6281 Muscle weakness (generalized): Secondary | ICD-10-CM | POA: Insufficient documentation

## 2019-08-28 DIAGNOSIS — Z96651 Presence of right artificial knee joint: Secondary | ICD-10-CM | POA: Insufficient documentation

## 2019-08-28 DIAGNOSIS — R262 Difficulty in walking, not elsewhere classified: Secondary | ICD-10-CM | POA: Diagnosis not present

## 2019-08-28 DIAGNOSIS — R600 Localized edema: Secondary | ICD-10-CM | POA: Insufficient documentation

## 2019-08-28 NOTE — Therapy (Signed)
[place], [name], [address] Phone: [phone]   Fax:  [phone]  Physical Therapy Evaluation  Patient Details  Name: [name]. MRN: [id number] Date of Birth: Dec 29, 1960 Referring Provider (PT): Dr. [name]   Encounter Date: 08/28/2019  PT End of Session - 08/28/19 1341    Visit Number  1    Number of Visits  24    Date for PT Re-Evaluation  10/23/19    PT Start Time  [id number]    PT Stop Time  1430    PT Time Calculation (min)  62 min    Activity Tolerance  Patient tolerated treatment well    Behavior During Therapy  [facility] for tasks assessed/performed       Past Medical History:  Diagnosis Date  . Arthritis   . Diabetes mellitus without complication ([place])    TYPE 2  . Diverticulosis   . Dyslipidemia   . GERD (gastroesophageal reflux disease)   . Herpes labialis   . Hypertension   . Longstanding persistent atrial fibrillation ([place])   . Metabolic syndrome   . Obesity   . Proteinuria   . Psoriasis   . Seborrheic dermatitis   . Sleep apnea    very compliant with CPAP, PT NEEDS TO BRING OWN MACHINE  . TIA (transient ischemic attack) 2009    Past Surgical History:  Procedure Laterality Date  . CARDIOVERSION N/A 12/02/2016   Procedure: CARDIOVERSION;  Surgeon: Pixie Casino, MD;  Location: [facility] ENDOSCOPY;  Service: Cardiovascular;  Laterality: N/A;  . COLONOSCOPY    . EXCISIONAL TOTAL KNEE ARTHROPLASTY WITH ANTIBIOTIC SPACERS Right 03/28/2019   Procedure: EXCISIONAL TOTAL KNEE ARTHROPLASTY WITH ANTIBIOTIC SPACERS;  Surgeon: [name], MD;  Location: [facility] ORS;  Service: Orthopedics;  Laterality: Right;  90 mins  . I & D KNEE WITH POLY EXCHANGE Right 09/10/2018   Procedure: Right Knee Arthroplasty IRRIGATION AND DEBRIDEMENT KNEE WITH POLY EXCHANGE;  Surgeon: [name], MD;  Location: [facility] ORS;  Service: Orthopedics;  Laterality: Right;  . JOINT REPLACEMENT  11/03/10   LT HIP  . PFO  occluder cardiac valve  2006   Dr. Einar Gip, (hole in heart)  . REIMPLANTATION OF TOTAL KNEE Right 06/15/2019   Procedure: Resection of antibiotic spacer and  irrigation and debridment and placement of new antibiotic spacer and components;  Surgeon: [name], MD;  Location: [facility] ORS;  Service: Orthopedics;  Laterality: Right;  90 mins  . REIMPLANTATION OF TOTAL KNEE Right 08/22/2019   Procedure: REIMPLANTATION OF TOTAL KNEE;  Surgeon: [name], MD;  Location: [facility] ORS;  Service: Orthopedics;  Laterality: Right;  120 mins  . TONSILLECTOMY AND ADENOIDECTOMY    . TOTAL HIP ARTHROPLASTY Left   . TOTAL KNEE ARTHROPLASTY Right 05/31/2018   Procedure: RIGHT TOTAL KNEE ARTHROPLASTY;  Surgeon: [name], MD;  Location: [facility] ORS;  Service: Orthopedics;  Laterality: Right;  70 mins  . TOTAL KNEE ARTHROPLASTY Left 06/28/2018   Procedure: LEFT TOTAL KNEE ARTHROPLASTY;  Surgeon: [name], MD;  Location: [facility] ORS;  Service: Orthopedics;  Laterality: Left;  70 mins  . WISDOM TOOTH EXTRACTION      There were no vitals filed for this visit.   Subjective Assessment - 08/28/19 1335    Subjective  Pt has had several procedures on Rt knee, including the most recent on 08/22/19 which was a resection of infection and reimplant of prosthesis.    See history for details.  Overall, he  has min pain.  He reports that it feels like "he got it right this time".  He walks with 1 crutch.  He is limited in all aspect of functional mobility, having significant issues with knee extension and swelling.    Pertinent History  Oct. 1, 2019 Rt TKR, Oct. 31, 2019  L TKR. 09/10/18 irrigation and debridement, 03/28/19 spacers placed, reimplant 06/15/19 and then again 08/22/19    Limitations  Standing;House hold activities;Lifting;Sitting    Patient Stated Goals  Pt would like to be able to return to yardwork, tennis    Currently in Pain?  Yes    Pain Score  3     Pain Location  Knee    Pain Orientation  Right    Pain  Descriptors / Indicators  Sore;Other (Comment)   sometimes an electrical charge   Pain Type  Surgical pain    Pain Radiating Towards  shin    Pain Onset  In the past 7 days    Pain Frequency  Intermittent    Aggravating Factors   walking, AM, getting up from the chair    Pain Relieving Factors  rest, medication    Effect of Pain on Daily Activities  limits mobility and walking    Multiple Pain Sites  No         [facility] PT Assessment - 08/28/19 0001      Assessment   Medical Diagnosis  Rt TKA     Referring Provider (PT)  Dr. [name]    Onset Date/Surgical Date  08/22/19    Next MD Visit  4 weeks     Prior Therapy  Yes       Precautions   Precautions  Knee    Precaution Comments  previous infection and multiple surgerie      Restrictions   Weight Bearing Restrictions  Yes    RLE Weight Bearing  Partial weight bearing    RLE Partial Weight Bearing Percentage or Pounds  50%    per patient      Balance Screen   Has the patient fallen in the past 6 months  No      [place] residence    Living Arrangements  Spouse/significant other    Type of [place] to enter    Entrance Stairs-Number of Steps  5    Entrance Stairs-Rails  Can reach both    [place] - 2 wheels;Cane - single point;Shower seat      Prior Function   Level of Independence  Independent with basic ADLs;Independent with household mobility with device;Independent with community mobility with device    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     Leisure  yardwork, home projects       Cognition   Overall Cognitive Status  Within [place] for tasks assessed      Observation/Other Assessments   Skin Integrity  well covered foam bandage     Focus on Therapeutic Outcomes (FOTO)   64%      Circumferential Edema   Circumferential - Right  18  1/2 inch     Circumferential - Left   15 5/8 inch       Sensation   Light Touch  Appears Intact      Posture/Postural Control  Posture/Postural Control  Postural limitations    Postural Limitations  Rounded Shoulders;Forward head;Posterior pelvic tilt    Posture Comments  knees flexed in standing       AROM   Right Knee Extension  25    Right Knee Flexion  85    Left Knee Extension  15    Left Knee Flexion  118      Strength   Right Hip Flexion  4/5    Left Hip Flexion  4+/5    Right Knee Flexion  4+/5    Right Knee Extension  3/5    Left Knee Flexion  5/5    Left Knee Extension  5/5      Palpation   Palpation comment  pain and soreness medial joint line , edema throughout , not warm, min redness      Bed Mobility   Bed Mobility  --   mod I      Transfers   Transfers  --   mod I      Ambulation/Gait   Ambulation Distance (Feet)  150 Feet    Assistive device  Standard walker;Crutches    Gait Pattern  Step-to pattern;Decreased stance time - right;Decreased weight shift to right;Right flexed knee in stance;Left flexed knee in stance                Objective measurements completed on examination: See above findings.      [facility] Adult PT Treatment/Exercise - 08/28/19 0001      Knee/Hip Exercises: Stretches   Active Hamstring Stretch  Right;1 rep    Knee: Self-Stretch to increase Flexion  Right;1 rep      Knee/Hip Exercises: Aerobic   Nustep  6 min LE only L4       Vasopneumatic   Number Minutes Vasopneumatic   15 minutes    Vasopnuematic Location   Knee    Vasopneumatic Pressure  Medium    Vasopneumatic Temperature   34 deg              PT Education - 08/28/19 1928    Education Details  PT/POC, edema, ROM, HEP and ice/compression    Person(s) Educated  Patient    Methods  Explanation;Handout    Comprehension  Verbalized understanding;Need further instruction       PT Short Term Goals - 08/28/19 1929      PT SHORT TERM GOAL #1    Title  Pt will be I with initial HEP for Rt knee ROM and strength, balance    Time  4    Period  Weeks    Status  New    Target Date  09/25/19      PT SHORT TERM GOAL #2   Title  Pt will be able to walk with/without least restricted assistive device in clinic with min noticeable limp, improved heel strike.    Time  4    Period  Weeks    Status  New    Target Date  09/25/19      PT SHORT TERM GOAL #3   Title  Pt will be able to sit with knee flexed to 95 deg for improved sit to stand symmetry and ease    Time  6    Period  Weeks    Status  New    Target Date  09/25/19      PT SHORT TERM GOAL #4   Title  Pt will be able to demo passive knee extension to  no more than 10 deg. to work towards near full extension and normal knee function.    Time  4    Period  Weeks    Status  New    Target Date  09/25/19      PT SHORT TERM GOAL #5   Title  Pt will understand RICE in order to minimize pain and swelling in RT LE    Time  8    Period  Weeks    Status  New    Target Date  09/25/19        PT Long Term Goals - 08/28/19 1949      PT LONG TERM GOAL #1   Title  Pt will improve FOTO score to no more than 49 % limited to demo improved functional mobility.    Baseline  64%    Time  8    Period  Weeks    Status  New    Target Date  10/23/19      PT LONG TERM GOAL #2   Title  Pt will be able to perform light recreational activites in the home and ADLs  with no more than min occasional difficulty    Time  8    Period  Weeks    Status  New    Target Date  10/23/19      PT LONG TERM GOAL #3   Title  Pt will be able to go up and down 12 stairs in his home with min difficulty, reciprocal and using 1 rail.    Time  8    Period  Weeks    Status  New    Target Date  10/23/19      PT LONG TERM GOAL #4   Title  Pt will demo 5/5 knee strength as well as hip abduction for normal, safe gait mechanics    Time  8    Period  Weeks    Status  New    Target Date  10/23/19      PT LONG  TERM GOAL #5   Title  Pt will be able to extend knee AROM to lacking no more than 10 deg.    Time  8    Period  Weeks    Status  New    Target Date  10/23/19      Additional Long Term Goals   Additional Long Term Goals  Yes      PT LONG TERM GOAL #6   Title  Pt will be I with final HEP upong discharge for long term mobility and health.    Time  8    Period  Weeks    Status  New    Target Date  10/23/19             Plan - 08/28/19 2003    Clinical Impression Statement  Pt presents to PT for moderate complexity eval of Rt knee revision and reimplantation of TKR performed on 08/22/19.  He has had a complicated course of care for >1 year, complicated by persisent infection.  He is overall doing well with soft foam bandage in place.  He has significant swelling, stiffness in Rt knee with AROM limited to 0-25-85 deg.  He does feel quite secure with gait but strength in quad is 3/5 and painful.  Walks with 1 crutch.  He was unable to stand, walk for many weeks and therefore has L knee stiffness as well. He will benefit from  PT for at least 8 weeks to full improve Rt knee function and maximize results.  Did not go through HEP today as he has previous experience with similar exercises.  Game Ready improved swelling post session. Has a similar device at home but it does not compress knee.    Personal Factors and Comorbidities  Comorbidity 1;Comorbidity 2;Past/Current Experience    Comorbidities  L THR, L TKR    Examination-Activity Limitations  Sit;Sleep;Carry;Stairs;Stand;Lift;Transfers;Locomotion Level    Examination-Participation Restrictions  Community Activity;Driving;Interpersonal Relationship;Yard Work    [employer] officer  Evolving/Moderate complexity    Clinical Decision Making  Moderate    Rehab Potential  Good    PT Frequency  3x / week    PT Duration  8 weeks    PT Treatment/Interventions  ADLs/Self Care Home Management;Stair training;Functional mobility  training;Cryotherapy;Neuromuscular re-education;Balance training;Therapeutic exercise;Therapeutic activities;Manual lymph drainage;Taping;Vasopneumatic Device;Manual techniques;Passive range of motion;Patient/family education;DME Instruction;Gait training;Electrical Stimulation    PT Next Visit Plan  HEP, ROM and strength as tol, Nustep vs recumbant , edema mgmt    PT Home Exercise Plan  level 1-2 knee : QS, AAROM, SLR, stand wgt shift, mini squats and sit to stand no UE assist if able       Patient will benefit from skilled therapeutic intervention in order to improve the following deficits and impairments:  Difficulty walking, Increased fascial restricitons, Decreased range of motion, Pain, Decreased skin integrity, Decreased balance, Decreased scar mobility, Hypomobility, Impaired flexibility, Decreased strength, Decreased mobility, Increased edema, Postural dysfunction  Visit Diagnosis: S/P right knee arthroscopy  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 S/P right TK rev 08/22/2019  . Status post revision of total knee, right 06/15/2019  . 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 High risk medication use 11/08/2018  . Infected right TKA 09/10/2018  . S/P left TKA 06/28/2018  . S/P TKR (total knee replacement), right 05/31/2018  . Osteoarthritis of right knee 05/09/2018  . Tubular adenoma of colon 10/18/2017  . 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 Diabetic nephropathy associated with type 2 diabetes mellitus ([place]) 10/13/2016  . Permanent  atrial fibrillation ([place]) 10/13/2016  . Health examination of defined subpopulation 11/15/2015  . Polycythemia vera ([place]) 10/09/2014  . Hyperlipidemia associated with type 2 diabetes mellitus ([place]) 09/17/2014  . Hypertension associated with diabetes ([place]) 09/17/2014  . Diabetes mellitus ([place]) 10/31/2013  . Hx-TIA (transient ischemic attack) 09/05/2012  . GERD (gastroesophageal reflux disease) 08/17/2011  . Obesity (BMI 30-39.9) 08/17/2011  . Psoriatic arthritis ([place]) 08/17/2011  . Obstructive sleep apnea 08/05/2009    PAA,JENNIFER 08/28/2019, 8:21 PM  [facility] [address] [place], [name], [address] Phone: [phone]   Fax:  [phone]  Name: [name]. MRN: [id number] Date of Birth: [birth year]  Raeford Razor, PT 08/28/19 8:21 PM Phone: [phone] Fax: [phone]

## 2019-08-30 ENCOUNTER — Other Ambulatory Visit: Payer: Self-pay

## 2019-08-30 ENCOUNTER — Telehealth: Payer: Self-pay

## 2019-08-30 MED ORDER — FREESTYLE LANCETS MISC: 1.0000 | 3 refills | Status: DC | PRN

## 2019-08-30 MED ORDER — FREESTYLE SYSTEM KIT: 1.0000 | PACK | 0 refills | Status: DC | PRN

## 2019-08-30 MED ORDER — GLUCOSE BLOOD VI STRP: 1.0000 | ORAL_STRIP | 3 refills | Status: DC | PRN

## 2019-08-30 NOTE — Telephone Encounter (Signed)
Received a fax from [facility] stating the pt. Needs a change in the prescription for her glucose meter, it says please send in new script for freestyle lite meter, strips, and lancets. Pt. Last apt. 03/23/19.

## 2019-08-30 NOTE — Telephone Encounter (Signed)
Done KH 

## 2019-08-30 NOTE — Discharge Summary (Signed)
Physician Discharge Summary  Patient ID: [name]. MRN: [id number] DOB/AGE: 1961/03/12 58 y.o.  Admit date: 08/22/2019 Discharge date: 08/23/2019   Procedures:  Procedure(s) (LRB): REIMPLANTATION OF TOTAL KNEE (Right)  Attending Physician:  Dr. [name]   Admission Diagnoses:   S/P right TKA resection and placement of antibiotic spacer  Discharge Diagnoses:  Principal Problem:   S/P right TK rev  Past Medical History:  Diagnosis Date  . Arthritis   . Diabetes mellitus without complication ([place])    TYPE 2  . Diverticulosis   . Dyslipidemia   . GERD (gastroesophageal reflux disease)   . Herpes labialis   . Hypertension   . Longstanding persistent atrial fibrillation ([place])   . Metabolic syndrome   . Obesity   . Proteinuria   . Psoriasis   . Seborrheic dermatitis   . Sleep apnea    very compliant with CPAP, PT NEEDS TO BRING OWN MACHINE  . TIA (transient ischemic attack) 2009    HPI:    William Barges., 58 y.o. male, has a history of pain and functional disability in the right knee(s) due to resection due to infection. He has been doing well on the IV antibiotic as well as off of them.  Lab values indicate that the infection is not returning since being off of antibiotics.  Further discussion was had and he wishes to proceed with a reimplantation.  Risks, benefits and expectations were discussed with the patient.  Risks including but not limited to the risk of anesthesia, blood clots, nerve damage, blood vessel damage, failure of the prosthesis, infection and up to and including death.  Patient understand the risks, benefits and expectations and wishes to proceed with surgery.  PCP: Denita Lung, MD   Discharged Condition: good  Hospital Course:  Patient underwent the above stated procedure on 08/22/2019. Patient tolerated the procedure well and brought to the recovery room in good condition and subsequently to the floor.  POD #1 BP: 129/81 ;  Pulse: 75 ; Temp: 97.5 F (36.4 C) ; Resp: 16 Patient reports pain as mild, controlled with medication, seen by Dr. Alvan Dame.  No events throughout the night.  Dr. Alvan Dame discussed the procedure, findings and expectations moving forward.  Patient ready to be discharged home.  He will follow up in the clinic in 2 weeks.  He knows to call with any questions or concerns.  Neurovascular intact and incision: dressing C/D/I.  LABS  Basename    HGB     15.4  HCT     45.4    Discharge Exam: General appearance: alert, cooperative and no distress Extremities: Homans sign is negative, no sign of DVT, no edema, redness or tenderness in the calves or thighs and no ulcers, gangrene or trophic changes  Disposition: Home with follow up in 2 weeks   Follow-up Information    [name], MD. Schedule an appointment as soon as possible for a visit in 2 weeks.   Specialty: Orthopedic Surgery Contact information: [address] [place] [address] [id number]           Discharge Instructions    Call MD / Call 911   Complete by: As directed    If you experience chest pain or shortness of breath, CALL 911 and be transported to the hospital emergency room.  If you develope a fever above 101 F, pus (white drainage) or increased drainage or redness at the wound, or calf pain, call  your surgeon's office.   Change dressing   Complete by: As directed    Maintain surgical dressing until follow up in the clinic. If the edges start to pull up, may reinforce with tape. If the dressing is no longer working, may remove and cover with gauze and tape, but must keep the area dry and clean.  Call with any questions or concerns.   Constipation Prevention   Complete by: As directed    Drink plenty of fluids.  Prune juice may be helpful.  You may use a stool softener, such as Colace (over the counter) 100 mg twice a day.  Use MiraLax (over the counter) for constipation as needed.   Diet - low sodium heart  healthy   Complete by: As directed    Discharge instructions   Complete by: As directed    Maintain surgical dressing until follow up in the clinic. If the edges start to pull up, may reinforce with tape. If the dressing is no longer working, may remove and cover with gauze and tape, but must keep the area dry and clean.  Follow up in 2 weeks at [facility]. Call with any questions or concerns.   Partial weight bearing   Complete by: As directed    % Body Weight: 50   Laterality: right   Extremity: Lower   TED hose   Complete by: As directed    Use stockings (TED hose) for 2 weeks on both leg(s).  You may remove them at night for sleeping.      Allergies as of 08/23/2019      Reactions   Morphine And Related Other (See Comments)   Hallucinations      Medication List    STOP taking these medications   Ixekizumab 80 MG/ML Soaj Commonly known as: Taltz     TAKE these medications   Accu-Chek FastClix Lancets Misc CHECK BLOOD SUGAR TWICE A DAY   Accu-Chek Guide test strip Generic drug: glucose blood USE TO TEST BLOOD GLUCOSE TWICE DAILY   carvedilol 25 MG tablet Commonly known as: COREG Take 25 mg by mouth 2 (two) times daily.   docusate sodium 100 MG capsule Commonly known as: Colace Take 1 capsule (100 mg total) by mouth 2 (two) times daily.   Entresto 24-26 MG Generic drug: sacubitril-valsartan Take 1 tablet by mouth 2 (two) times daily.   ferrous sulfate 325 (65 FE) MG tablet Commonly known as: FerrouSul Take 1 tablet (325 mg total) by mouth 3 (three) times daily with meals for 14 days.   HYDROcodone-acetaminophen 7.5-325 MG tablet Commonly known as: Norco Take 1-2 tablets by mouth every 4 (four) hours as needed for moderate pain.   Jardiance 10 MG Tabs tablet Generic drug: empagliflozin TAKE 1 TABLET BY MOUTH ONCE DAILY   magnesium hydroxide 400 MG/5ML suspension Commonly known as: MILK OF MAGNESIA Take 5 mLs by mouth daily as needed for mild  constipation.   methocarbamol 500 MG tablet Commonly known as: Robaxin Take 1 tablet (500 mg total) by mouth every 6 (six) hours as needed for muscle spasms.   multivitamin with minerals Tabs tablet Take 1 tablet by mouth daily.   oxymetazoline 0.05 % nasal spray Commonly known as: AFRIN Place 1 spray into both nostrils at bedtime.   pantoprazole 40 MG tablet Commonly known as: PROTONIX Take 1 tablet (40 mg total) by mouth 2 (two) times daily. What changed: when to take this   polyethylene glycol 17 g packet Commonly known as:  MIRALAX / GLYCOLAX Take 17 g by mouth 2 (two) times daily.   rosuvastatin 10 MG tablet Commonly known as: CRESTOR Take 1 tablet (10 mg total) by mouth daily.   Vitamin D (Cholecalciferol) 25 MCG (1000 UT) Caps Take 1,000 Units by mouth daily.   Xarelto 20 MG Tabs tablet Generic drug: rivaroxaban TAKE 1 TABLET BY MOUTH DAILY WITH SUPPER. What changed: See the new instructions.            Discharge Care Instructions  (From admission, onward)         Start     Ordered   08/23/19 0000  Change dressing    Comments: Maintain surgical dressing until follow up in the clinic. If the edges start to pull up, may reinforce with tape. If the dressing is no longer working, may remove and cover with gauze and tape, but must keep the area dry and clean.  Call with any questions or concerns.   08/23/19 0850   08/23/19 0000  Partial weight bearing    Question Answer Comment  % Body Weight 50   Laterality right   Extremity Lower      08/23/19 0850           Signed: [place] P[place]. Zhane Donlan   PA-C  08/30/2019, 10:28 AM

## 2019-08-31 ENCOUNTER — Ambulatory Visit: Payer: 59 | Admitting: Physical Therapy

## 2019-08-31 ENCOUNTER — Encounter: Payer: Self-pay | Admitting: Physical Therapy

## 2019-08-31 ENCOUNTER — Other Ambulatory Visit: Payer: Self-pay

## 2019-08-31 DIAGNOSIS — R600 Localized edema: Secondary | ICD-10-CM | POA: Diagnosis not present

## 2019-08-31 DIAGNOSIS — R262 Difficulty in walking, not elsewhere classified: Secondary | ICD-10-CM | POA: Diagnosis not present

## 2019-08-31 DIAGNOSIS — Z96651 Presence of right artificial knee joint: Secondary | ICD-10-CM | POA: Diagnosis not present

## 2019-08-31 DIAGNOSIS — Z9889 Other specified postprocedural states: Secondary | ICD-10-CM | POA: Diagnosis not present

## 2019-08-31 DIAGNOSIS — M6281 Muscle weakness (generalized): Secondary | ICD-10-CM | POA: Diagnosis not present

## 2019-08-31 MED FILL — METHOCARBAMOL 500 MG TABS: 500 | 10 days supply | Qty: 40 | Fill #0

## 2019-08-31 MED FILL — HYDROCODON-APAP 7.5-325: 7.5-325 | 5 days supply | Qty: 60 | Fill #0

## 2019-08-31 NOTE — Therapy (Signed)
[name] [place], [name], [address] Phone: [phone]   Fax:  [phone]  Physical Therapy Treatment  Patient Details  Name: [name]. MRN: [id number] Date of Birth: 07-05-1961 Referring Provider (PT): Dr. [name]   Encounter Date: 08/31/2019  PT End of Session - 08/31/19 0908    Visit Number  2    Number of Visits  24    Date for PT Re-Evaluation  10/23/19    Authorization Type  [facility]    PT Start Time  0831    PT Stop Time  0925    PT Time Calculation (min)  54 min    Activity Tolerance  Patient tolerated treatment well    Behavior During Therapy  [facility] for tasks assessed/performed       Past Medical History:  Diagnosis Date  . Arthritis   . Diabetes mellitus without complication ([place])    TYPE 2  . Diverticulosis   . Dyslipidemia   . GERD (gastroesophageal reflux disease)   . Herpes labialis   . Hypertension   . Longstanding persistent atrial fibrillation ([place])   . Metabolic syndrome   . Obesity   . Proteinuria   . Psoriasis   . Seborrheic dermatitis   . Sleep apnea    very compliant with CPAP, PT NEEDS TO BRING OWN MACHINE  . TIA (transient ischemic attack) 2009    Past Surgical History:  Procedure Laterality Date  . CARDIOVERSION N/A 12/02/2016   Procedure: CARDIOVERSION;  Surgeon: Pixie Casino, MD;  Location: [facility] ENDOSCOPY;  Service: Cardiovascular;  Laterality: N/A;  . COLONOSCOPY    . EXCISIONAL TOTAL KNEE ARTHROPLASTY WITH ANTIBIOTIC SPACERS Right 03/28/2019   Procedure: EXCISIONAL TOTAL KNEE ARTHROPLASTY WITH ANTIBIOTIC SPACERS;  Surgeon: [name], MD;  Location: [facility];  Service: Orthopedics;  Laterality: Right;  90 mins  . I & D KNEE WITH POLY EXCHANGE Right 09/10/2018   Procedure: Right Knee Arthroplasty IRRIGATION AND DEBRIDEMENT KNEE WITH POLY EXCHANGE;  Surgeon: [name], MD;  Location: [facility];  Service: Orthopedics;  Laterality: Right;  . JOINT  REPLACEMENT  11/03/10   LT HIP  . PFO occluder cardiac valve  2006   Dr. Einar Gip, (hole in heart)  . REIMPLANTATION OF TOTAL KNEE Right 06/15/2019   Procedure: Resection of antibiotic spacer and  irrigation and debridment and placement of new antibiotic spacer and components;  Surgeon: [name], MD;  Location: [facility];  Service: Orthopedics;  Laterality: Right;  90 mins  . REIMPLANTATION OF TOTAL KNEE Right 08/22/2019   Procedure: REIMPLANTATION OF TOTAL KNEE;  Surgeon: [name], MD;  Location: [facility];  Service: Orthopedics;  Laterality: Right;  120 mins  . TONSILLECTOMY AND ADENOIDECTOMY    . TOTAL HIP ARTHROPLASTY Left   . TOTAL KNEE ARTHROPLASTY Right 05/31/2018   Procedure: RIGHT TOTAL KNEE ARTHROPLASTY;  Surgeon: [name], MD;  Location: [facility];  Service: Orthopedics;  Laterality: Right;  70 mins  . TOTAL KNEE ARTHROPLASTY Left 06/28/2018   Procedure: LEFT TOTAL KNEE ARTHROPLASTY;  Surgeon: [name], MD;  Location: [facility];  Service: Orthopedics;  Laterality: Left;  70 mins  . WISDOM TOOTH EXTRACTION      There were no vitals filed for this visit.  Subjective Assessment - 08/31/19 0831    Subjective  Patient reports he is doing well and is consistent with exercises. He continues to note minimal discomfort and he is walking with his single axillary crutch.  Currently in Pain?  Yes    Pain Score  3     Pain Location  Knee    Pain Orientation  Right    Pain Descriptors / Indicators  Sore    Pain Type  Surgical pain    Pain Onset  1 to 4 weeks ago    Pain Frequency  Intermittent         [facility] PT Assessment - 08/31/19 0001      AROM   Right Knee Extension  12    Right Knee Flexion  91                   [facility] Adult PT Treatment/Exercise - 08/31/19 0001      Exercises   Exercises  Knee/Hip      Knee/Hip Exercises: Stretches   Active Hamstring Stretch  2 reps;20 seconds    Active Hamstring Stretch Limitations  seated EOB      Knee/Hip  Exercises: Aerobic   Nustep  6 min LE only L4       Knee/Hip Exercises: Supine   Quad Sets  5 reps   5 sec hold   Quad Sets Limitations  tactile cueing for quad activation    Short Arc Quad Sets  2 sets;10 reps    Straight Leg Raises  10 reps    Straight Leg Raises Limitations  patient exhibits extension lag      Vasopneumatic   Number Minutes Vasopneumatic   15 minutes    Vasopnuematic Location   Knee    Vasopneumatic Pressure  Medium    Vasopneumatic Temperature   34 deg       Manual Therapy   Manual Therapy  Joint mobilization;Passive ROM;Soft tissue mobilization    Joint Mobilization  Gentle TF joint mobs, PF joint mobs in all directions    Passive ROM  Knee flexion and extension stretching in seated and supine              PT Education - 08/31/19 0832    Education Details  HEP    Person(s) Educated  Patient    Methods  Explanation;Demonstration;Tactile cues;Verbal cues;Handout    Comprehension  Verbalized understanding;Returned demonstration;Verbal cues required;Tactile cues required;Need further instruction       PT Short Term Goals - 08/28/19 1929      PT SHORT TERM GOAL #1   Title  Pt will be I with initial HEP for Rt knee ROM and strength, balance    Time  4    Period  Weeks    Status  New    Target Date  09/25/19      PT SHORT TERM GOAL #2   Title  Pt will be able to walk with/without least restricted assistive device in clinic with min noticeable limp, improved heel strike.    Time  4    Period  Weeks    Status  New    Target Date  09/25/19      PT SHORT TERM GOAL #3   Title  Pt will be able to sit with knee flexed to 95 deg for improved sit to stand symmetry and ease    Time  6    Period  Weeks    Status  New    Target Date  09/25/19      PT SHORT TERM GOAL #4   Title  Pt will be able to demo passive knee extension to no more than 10 deg. to work towards  near full extension and normal knee function.    Time  4    Period  Weeks    Status   New    Target Date  09/25/19      PT SHORT TERM GOAL #5   Title  Pt will understand RICE in order to minimize pain and swelling in RT LE    Time  8    Period  Weeks    Status  New    Target Date  09/25/19        PT Long Term Goals - 08/28/19 1949      PT LONG TERM GOAL #1   Title  Pt will improve FOTO score to no more than 49 % limited to demo improved functional mobility.    Baseline  64%    Time  8    Period  Weeks    Status  New    Target Date  10/23/19      PT LONG TERM GOAL #2   Title  Pt will be able to perform light recreational activites in the home and ADLs  with no more than min occasional difficulty    Time  8    Period  Weeks    Status  New    Target Date  10/23/19      PT LONG TERM GOAL #3   Title  Pt will be able to go up and down 12 stairs in his home with min difficulty, reciprocal and using 1 rail.    Time  8    Period  Weeks    Status  New    Target Date  10/23/19      PT LONG TERM GOAL #4   Title  Pt will demo 5/5 knee strength as well as hip abduction for normal, safe gait mechanics    Time  8    Period  Weeks    Status  New    Target Date  10/23/19      PT LONG TERM GOAL #5   Title  Pt will be able to extend knee AROM to lacking no more than 10 deg.    Time  8    Period  Weeks    Status  New    Target Date  10/23/19      Additional Long Term Goals   Additional Long Term Goals  Yes      PT LONG TERM GOAL #6   Title  Pt will be I with final HEP upong discharge for long term mobility and health.    Time  8    Period  Weeks    Status  New    Target Date  10/23/19            Plan - 08/31/19 [id number]    Clinical Impression Statement  Patient is progressing well with his range of motion and exhibits improvement in both flexion and extension. He continues to exhibit quad strength deficit with extension lag performing SLR. He is continuing to ambulate with single axillary crutch per doctors orders to remain relatively partial weight  bearing. His knee continues to exhibit significant swelling which improved with use of Gamready. He would benefit from continued skilled PT to progress motion and strength as able to return to PLOF.    PT Treatment/Interventions  ADLs/Self Care Home Management;Stair training;Functional mobility training;Cryotherapy;Neuromuscular re-education;Balance training;Therapeutic exercise;Therapeutic activities;Manual lymph drainage;Taping;Vasopneumatic Device;Manual techniques;Passive range of motion;Patient/family education;DME Instruction;Gait training;Electrical Stimulation    PT Next Visit Plan  Assess HEP and progress PRN, ROM and strength as tol, Nustep vs recumbant, edema mgmt    PT Home Exercise Plan  QS, towel slide flexion, SLR, stand wgt shift, mini squats and sit to stand no UE assist if able, calf stretch, seated or supine hamstring stretch    Consulted and Agree with Plan of Care  Patient       Patient will benefit from skilled therapeutic intervention in order to improve the following deficits and impairments:  Difficulty walking, Increased fascial restricitons, Decreased range of motion, Pain, Decreased skin integrity, Decreased balance, Decreased scar mobility, Hypomobility, Impaired flexibility, Decreased strength, Decreased mobility, Increased edema, Postural dysfunction  Visit Diagnosis: S/P right knee arthroscopy  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 S/P right TK rev 08/22/2019  . Status post revision of total knee, right 06/15/2019  . 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 with presence of  antibiotic-impregnated cement spacer 03/28/2019  . High risk medication use 11/08/2018  . Infected right TKA 09/10/2018  . S/P left TKA 06/28/2018  . S/P TKR (total knee replacement), right 05/31/2018  . Osteoarthritis of right knee 05/09/2018  . Tubular adenoma of colon 10/18/2017  . 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 Diabetic nephropathy associated with type 2 diabetes mellitus ([place]) 10/13/2016  . Permanent atrial fibrillation ([place]) 10/13/2016  . Health examination of defined subpopulation 11/15/2015  . Polycythemia vera ([place]) 10/09/2014  . Hyperlipidemia associated with type 2 diabetes mellitus ([place]) 09/17/2014  . Hypertension associated with diabetes ([place]) 09/17/2014  . Diabetes mellitus ([place]) 10/31/2013  . Hx-TIA (transient ischemic attack) 09/05/2012  . GERD (gastroesophageal reflux disease) 08/17/2011  . Obesity (BMI 30-39.9) 08/17/2011  . Psoriatic arthritis ([place]) 08/17/2011  . Obstructive sleep apnea 08/05/2009    Hilda Blades, PT, DPT, LAT, ATC 08/31/19  9:32 AM Phone: [phone] Fax: [facility] [address] [place], [name], [address] Phone: [phone]   Fax:  [phone]  Name: [name]. MRN: [id number] Date of Birth: 1961-05-30

## 2019-09-04 ENCOUNTER — Encounter: Payer: Self-pay | Admitting: Physical Therapy

## 2019-09-04 ENCOUNTER — Other Ambulatory Visit: Payer: Self-pay

## 2019-09-04 ENCOUNTER — Ambulatory Visit: Payer: 59 | Attending: Orthopedic Surgery | Admitting: Physical Therapy

## 2019-09-04 ENCOUNTER — Other Ambulatory Visit: Payer: Self-pay | Admitting: Internal Medicine

## 2019-09-04 ENCOUNTER — Encounter: Payer: Self-pay | Admitting: Family Medicine

## 2019-09-04 DIAGNOSIS — M6281 Muscle weakness (generalized): Secondary | ICD-10-CM | POA: Insufficient documentation

## 2019-09-04 DIAGNOSIS — R600 Localized edema: Secondary | ICD-10-CM | POA: Insufficient documentation

## 2019-09-04 DIAGNOSIS — Z96651 Presence of right artificial knee joint: Secondary | ICD-10-CM | POA: Diagnosis not present

## 2019-09-04 DIAGNOSIS — Z9889 Other specified postprocedural states: Secondary | ICD-10-CM | POA: Diagnosis not present

## 2019-09-04 DIAGNOSIS — R262 Difficulty in walking, not elsewhere classified: Secondary | ICD-10-CM | POA: Insufficient documentation

## 2019-09-04 NOTE — Therapy (Signed)
[place], [name], [address] Phone: [phone]   Fax:  [phone]  Physical Therapy Treatment  Patient Details  Name: [name]. MRN: [id number] Date of Birth: 1961/05/28 Referring Provider (PT): Dr. [name]   Encounter Date: 09/04/2019  PT End of Session - 09/04/19 0934    Visit Number  3    Number of Visits  24    Date for PT Re-Evaluation  10/23/19    Authorization Type  [facility]    PT Start Time  0913    PT Stop Time  1010    PT Time Calculation (min)  57 min    Activity Tolerance  Patient tolerated treatment well    Behavior During Therapy  [facility] for tasks assessed/performed       Past Medical History:  Diagnosis Date  . Arthritis   . Diabetes mellitus without complication ([place])    TYPE 2  . Diverticulosis   . Dyslipidemia   . GERD (gastroesophageal reflux disease)   . Herpes labialis   . Hypertension   . Longstanding persistent atrial fibrillation ([place])   . Metabolic syndrome   . Obesity   . Proteinuria   . Psoriasis   . Seborrheic dermatitis   . Sleep apnea    very compliant with CPAP, PT NEEDS TO BRING OWN MACHINE  . TIA (transient ischemic attack) 2009    Past Surgical History:  Procedure Laterality Date  . CARDIOVERSION N/A 12/02/2016   Procedure: CARDIOVERSION;  Surgeon: Pixie Casino, MD;  Location: [facility] ENDOSCOPY;  Service: Cardiovascular;  Laterality: N/A;  . COLONOSCOPY    . EXCISIONAL TOTAL KNEE ARTHROPLASTY WITH ANTIBIOTIC SPACERS Right 03/28/2019   Procedure: EXCISIONAL TOTAL KNEE ARTHROPLASTY WITH ANTIBIOTIC SPACERS;  Surgeon: [name], MD;  [facility];  Service: Orthopedics;  Laterality: Right;  90 mins  . I & D KNEE WITH POLY EXCHANGE Right 09/10/2018   Procedure: Right Knee Arthroplasty IRRIGATION AND DEBRIDEMENT KNEE WITH POLY EXCHANGE;  Surgeon: [name], MD;  Location: [facility];  Service: Orthopedics;  Laterality: Right;  . JOINT REPLACEMENT   11/03/10   LT HIP  . PFO occluder cardiac valve  2006   Dr. Einar Gip, (hole in heart)  . REIMPLANTATION OF TOTAL KNEE Right 06/15/2019   Procedure: Resection of antibiotic spacer and  irrigation and debridment and placement of new antibiotic spacer and components;  Surgeon: [name], MD;  Location: [facility];  Service: Orthopedics;  Laterality: Right;  90 mins  . REIMPLANTATION OF TOTAL KNEE Right 08/22/2019   Procedure: REIMPLANTATION OF TOTAL KNEE;  Surgeon: [name], MD;  Location: [facility];  Service: Orthopedics;  Laterality: Right;  120 mins  . TONSILLECTOMY AND ADENOIDECTOMY    . TOTAL HIP ARTHROPLASTY Left   . TOTAL KNEE ARTHROPLASTY Right 05/31/2018   Procedure: RIGHT TOTAL KNEE ARTHROPLASTY;  Surgeon: [name], MD;  Location: [facility];  Service: Orthopedics;  Laterality: Right;  70 mins  . TOTAL KNEE ARTHROPLASTY Left 06/28/2018   Procedure: LEFT TOTAL KNEE ARTHROPLASTY;  Surgeon: [name], MD;  Location: [facility];  Service: Orthopedics;  Laterality: Left;  70 mins  . WISDOM TOOTH EXTRACTION      There were no vitals filed for this visit.  Subjective Assessment - 09/04/19 0917    Subjective  Patient reports he is feeling a little pain this morning. He may have over-done it with his walking and he got on the stationary cycle to stretch  the knee.    Currently in Pain?  Yes    Pain Score  4     Pain Location  Knee    Pain Orientation  Right    Pain Descriptors / Indicators  Aching;Sore    Pain Type  Surgical pain    Pain Onset  1 to 4 weeks ago    Pain Frequency  Constant         [facility] PT Assessment - 09/04/19 0001      AROM   Right Knee Extension  10    Right Knee Flexion  101                   [facility] Adult PT Treatment/Exercise - 09/04/19 0001      Exercises   Exercises  Knee/Hip      Knee/Hip Exercises: Stretches   Active Hamstring Stretch  2 reps;20 seconds    Active Hamstring Stretch Limitations  seated EOB      Knee/Hip Exercises: Aerobic    Nustep  6 min LE only L4       Knee/Hip Exercises: Seated   Long Arc Quad  10 reps    Long Arc Quad Limitations  VC for technique      Knee/Hip Exercises: Supine   Quad Sets  10 reps   3 sec hold   Quad Sets Limitations  VC for quad activation    Short Arc [employer]  2 sets;10 reps    Straight Leg Raises  2 sets;5 sets    Straight Leg Raises Limitations  patient exhibits fatigue so decreased reps       Modalities   Modalities  Vasopneumatic      Vasopneumatic   Number Minutes Vasopneumatic   15 minutes    Vasopnuematic Location   Knee    Vasopneumatic Pressure  Medium    Vasopneumatic Temperature   34      Manual Therapy   Manual Therapy  Joint mobilization;Passive ROM    Joint Mobilization  Gentle TF joint mobs in supine and seated, PF joint mobs in all directions    Passive ROM  Knee flexion and extension stretching in seated and supine              PT Education - 09/04/19 0921    Education Details  HEP    Person(s) Educated  Patient    Methods  Explanation;Demonstration;Verbal cues;Handout    Comprehension  Verbalized understanding;Returned demonstration;Verbal cues required;Need further instruction       PT Short Term Goals - 08/28/19 1929      PT SHORT TERM GOAL #1   Title  Pt will be I with initial HEP for Rt knee ROM and strength, balance    Time  4    Period  Weeks    Status  New    Target Date  09/25/19      PT SHORT TERM GOAL #2   Title  Pt will be able to walk with/without least restricted assistive device in clinic with min noticeable limp, improved heel strike.    Time  4    Period  Weeks    Status  New    Target Date  09/25/19      PT SHORT TERM GOAL #3   Title  Pt will be able to sit with knee flexed to 95 deg for improved sit to stand symmetry and ease    Time  6    Period  Weeks  Status  New    Target Date  09/25/19      PT SHORT TERM GOAL #4   Title  Pt will be able to demo passive knee extension to no more than 10 deg. to  work towards near full extension and normal knee function.    Time  4    Period  Weeks    Status  New    Target Date  09/25/19      PT SHORT TERM GOAL #5   Title  Pt will understand RICE in order to minimize pain and swelling in RT LE    Time  8    Period  Weeks    Status  New    Target Date  09/25/19        PT Long Term Goals - 08/28/19 1949      PT LONG TERM GOAL #1   Title  Pt will improve FOTO score to no more than 49 % limited to demo improved functional mobility.    Baseline  64%    Time  8    Period  Weeks    Status  New    Target Date  10/23/19      PT LONG TERM GOAL #2   Title  Pt will be able to perform light recreational activites in the home and ADLs  with no more than min occasional difficulty    Time  8    Period  Weeks    Status  New    Target Date  10/23/19      PT LONG TERM GOAL #3   Title  Pt will be able to go up and down 12 stairs in his home with min difficulty, reciprocal and using 1 rail.    Time  8    Period  Weeks    Status  New    Target Date  10/23/19      PT LONG TERM GOAL #4   Title  Pt will demo 5/5 knee strength as well as hip abduction for normal, safe gait mechanics    Time  8    Period  Weeks    Status  New    Target Date  10/23/19      PT LONG TERM GOAL #5   Title  Pt will be able to extend knee AROM to lacking no more than 10 deg.    Time  8    Period  Weeks    Status  New    Target Date  10/23/19      Additional Long Term Goals   Additional Long Term Goals  Yes      PT LONG TERM GOAL #6   Title  Pt will be I with final HEP upong discharge for long term mobility and health.    Time  8    Period  Weeks    Status  New    Target Date  10/23/19            Plan - 09/04/19 0935    Clinical Impression Statement  Patient is continuing to progress well with his range of motion and strengthening. His SLR was improved this visit but he continues to exhibit fatigue and lag with increased repeptitions. Gameready used for  swelling and pain control. He would benefit from continued skilld PT to progress his range of motion and strength as able.    PT Treatment/Interventions  ADLs/Self Care Home Management;Stair training;Functional mobility training;Cryotherapy;Neuromuscular re-education;Balance training;Therapeutic exercise;Therapeutic activities;Manual  lymph drainage;Taping;Vasopneumatic Device;Manual techniques;Passive range of motion;Patient/family education;DME Instruction;Gait training;Electrical Stimulation    PT Next Visit Plan  Assess HEP and progress PRN, ROM and strength as tol, Nustep vs recumbant, edema mgmt    PT Home Exercise Plan  QS, towel slide flexion, SLR, stand wgt shift, mini squats and sit to stand no UE assist if able, calf stretch, seated or supine hamstring stretch    Consulted and Agree with Plan of Care  Patient       Patient will benefit from skilled therapeutic intervention in order to improve the following deficits and impairments:  Difficulty walking, Increased fascial restricitons, Decreased range of motion, Pain, Decreased skin integrity, Decreased balance, Decreased scar mobility, Hypomobility, Impaired flexibility, Decreased strength, Decreased mobility, Increased edema, Postural dysfunction  Visit Diagnosis: S/P right knee arthroscopy  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 S/P right TK rev 08/22/2019  . Status post revision of total knee, right 06/15/2019  . 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 High risk medication use 11/08/2018  .  Infected right TKA 09/10/2018  . S/P left TKA 06/28/2018  . S/P TKR (total knee replacement), right 05/31/2018  . Osteoarthritis of right knee 05/09/2018  . Tubular adenoma of colon 10/18/2017  . 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 Diabetic nephropathy associated with type 2 diabetes mellitus ([place]) 10/13/2016  . Permanent atrial fibrillation ([place]) 10/13/2016  . Health examination of defined subpopulation 11/15/2015  . Polycythemia vera ([place]) 10/09/2014  . Hyperlipidemia associated with type 2 diabetes mellitus ([place]) 09/17/2014  . Hypertension associated with diabetes ([place]) 09/17/2014  . Diabetes mellitus ([place]) 10/31/2013  . Hx-TIA (transient ischemic attack) 09/05/2012  . GERD (gastroesophageal reflux disease) 08/17/2011  . Obesity (BMI 30-39.9) 08/17/2011  . Psoriatic arthritis ([place]) 08/17/2011  . Obstructive sleep apnea 08/05/2009    Hilda Blades, PT, DPT, LAT, ATC 09/04/19  11:38 AM Phone: [phone] Fax: [facility] [address] [place], [name], [address] Phone: [phone]   Fax:  [phone]  Name: [name]. MRN: [id number] Date of Birth: [birth year]

## 2019-09-05 MED FILL — CARVEDILOL 25 MG TABLET: 25 | 90 days supply | Qty: 180 | Fill #0

## 2019-09-07 ENCOUNTER — Other Ambulatory Visit: Payer: Self-pay

## 2019-09-07 ENCOUNTER — Ambulatory Visit: Payer: 59 | Admitting: Physical Therapy

## 2019-09-07 ENCOUNTER — Encounter: Payer: Self-pay | Admitting: Physical Therapy

## 2019-09-07 DIAGNOSIS — Z96651 Presence of right artificial knee joint: Secondary | ICD-10-CM | POA: Diagnosis not present

## 2019-09-07 DIAGNOSIS — R262 Difficulty in walking, not elsewhere classified: Secondary | ICD-10-CM | POA: Diagnosis not present

## 2019-09-07 DIAGNOSIS — M6281 Muscle weakness (generalized): Secondary | ICD-10-CM | POA: Diagnosis not present

## 2019-09-07 DIAGNOSIS — Z9889 Other specified postprocedural states: Secondary | ICD-10-CM | POA: Diagnosis not present

## 2019-09-07 DIAGNOSIS — R600 Localized edema: Secondary | ICD-10-CM

## 2019-09-07 NOTE — Therapy (Signed)
[place], [name], [address] Phone: [phone]   Fax:  [phone]  Physical Therapy Treatment  Patient Details  Name: [name]. MRN: [id number] Date of Birth: 05-Jun-1961 Referring Provider (PT): Dr. [name]   Encounter Date: 09/07/2019  PT End of Session - 09/07/19 1456    Visit Number  4    Number of Visits  24    Date for PT Re-Evaluation  10/23/19    Authorization Type  [facility]    PT Start Time  0915    PT Stop Time  1000    PT Time Calculation (min)  45 min    Activity Tolerance  Patient tolerated treatment well    Behavior During Therapy  [facility] for tasks assessed/performed       Past Medical History:  Diagnosis Date  . Arthritis   . Diabetes mellitus without complication ([place])    TYPE 2  . Diverticulosis   . Dyslipidemia   . GERD (gastroesophageal reflux disease)   . Herpes labialis   . Hypertension   . Longstanding persistent atrial fibrillation ([place])   . Metabolic syndrome   . Obesity   . Proteinuria   . Psoriasis   . Seborrheic dermatitis   . Sleep apnea    very compliant with CPAP, PT NEEDS TO BRING OWN MACHINE  . TIA (transient ischemic attack) 2009    Past Surgical History:  Procedure Laterality Date  . CARDIOVERSION N/A 12/02/2016   Procedure: CARDIOVERSION;  Surgeon: Pixie Casino, MD;  Location: [facility] ENDOSCOPY;  Service: Cardiovascular;  Laterality: N/A;  . COLONOSCOPY    . EXCISIONAL TOTAL KNEE ARTHROPLASTY WITH ANTIBIOTIC SPACERS Right 03/28/2019   Procedure: EXCISIONAL TOTAL KNEE ARTHROPLASTY WITH ANTIBIOTIC SPACERS;  Surgeon: [name], MD;  Location: [facility] ORS;  Service: Orthopedics;  Laterality: Right;  90 mins  . I & D KNEE WITH POLY EXCHANGE Right 09/10/2018   Procedure: Right Knee Arthroplasty IRRIGATION AND DEBRIDEMENT KNEE WITH POLY EXCHANGE;  Surgeon: [name], MD;  Location: [facility];  Service: Orthopedics;  Laterality: Right;  . JOINT REPLACEMENT   11/03/10   LT HIP  . PFO occluder cardiac valve  2006   Dr. Einar Gip, (hole in heart)  . REIMPLANTATION OF TOTAL KNEE Right 06/15/2019   Procedure: Resection of antibiotic spacer and  irrigation and debridment and placement of new antibiotic spacer and components;  Surgeon: [name], MD;  Location: [facility];  Service: Orthopedics;  Laterality: Right;  90 mins  . REIMPLANTATION OF TOTAL KNEE Right 08/22/2019   Procedure: REIMPLANTATION OF TOTAL KNEE;  Surgeon: [name], MD;  Location: [facility];  Service: Orthopedics;  Laterality: Right;  120 mins  . TONSILLECTOMY AND ADENOIDECTOMY    . TOTAL HIP ARTHROPLASTY Left   . TOTAL KNEE ARTHROPLASTY Right 05/31/2018   Procedure: RIGHT TOTAL KNEE ARTHROPLASTY;  Surgeon: [name], MD;  Location: [facility];  Service: Orthopedics;  Laterality: Right;  70 mins  . TOTAL KNEE ARTHROPLASTY Left 06/28/2018   Procedure: LEFT TOTAL KNEE ARTHROPLASTY;  Surgeon: [name], MD;  Location: [facility];  Service: Orthopedics;  Laterality: Left;  70 mins  . WISDOM TOOTH EXTRACTION      There were no vitals filed for this visit.  Subjective Assessment - 09/07/19 0923    Subjective  Patient reports he saw his doctor and they were pleased with his range of motion. He was told there was fluid on the knee but they were  going to wait before trying to remove the fluid. He is also to remain as PWB using the crutch.    Currently in Pain?  Yes    Pain Score  2     Pain Location  Knee    Pain Orientation  Right    Pain Descriptors / Indicators  Aching;Sore;Tightness    Pain Type  Surgical pain    Pain Onset  1 to 4 weeks ago    Pain Frequency  Constant         [facility] PT Assessment - 09/07/19 0001      Assessment   Medical Diagnosis  Rt TKA     Referring Provider (PT)  Dr. [name]    Onset Date/Surgical Date  08/22/19    Next MD Visit  10/06/2019      Restrictions   Weight Bearing Restrictions  Yes    RLE Weight Bearing  Partial weight bearing      AROM    Right Knee Extension  7    Right Knee Flexion  106                   [facility] Adult PT Treatment/Exercise - 09/07/19 0001      Knee/Hip Exercises: Stretches   Passive Hamstring Stretch  2 reps;30 seconds    Passive Hamstring Stretch Limitations  supine    Gastroc Stretch  2 reps;30 seconds      Knee/Hip Exercises: Aerobic   Nustep  6 min LE only L5       Knee/Hip Exercises: Supine   Quad Sets  10 reps   3 sec hold   Short Arc Quad Sets  10 reps    Straight Leg Raises  2 sets;5 sets    Straight Leg Raises Limitations  improved quad control, continues to exhibit fatigue      Manual Therapy   Manual Therapy  Joint mobilization;Passive ROM;Soft tissue mobilization    Manual therapy comments  Focused on improving knee motion    Joint Mobilization  Gentle AP and rotational tibiofemoral joint mobs in supine and seated, PF joint mobs in all directions    Soft tissue mobilization  Right calf     Passive ROM  Knee flexion and extension stretching in seated and supine, supine hamstring stretch              PT Education - 09/07/19 1456    Education Details  HEP    Person(s) Educated  Patient    Methods  Explanation;Demonstration;Tactile cues;Verbal cues    Comprehension  Verbalized understanding;Returned demonstration;Verbal cues required;Tactile cues required;Need further instruction       PT Short Term Goals - 08/28/19 1929      PT SHORT TERM GOAL #1   Title  Pt will be I with initial HEP for Rt knee ROM and strength, balance    Time  4    Period  Weeks    Status  New    Target Date  09/25/19      PT SHORT TERM GOAL #2   Title  Pt will be able to walk with/without least restricted assistive device in clinic with min noticeable limp, improved heel strike.    Time  4    Period  Weeks    Status  New    Target Date  09/25/19      PT SHORT TERM GOAL #3   Title  Pt will be able to sit with knee flexed to 95 deg  for improved sit to stand symmetry and ease    Time   6    Period  Weeks    Status  New    Target Date  09/25/19      PT SHORT TERM GOAL #4   Title  Pt will be able to demo passive knee extension to no more than 10 deg. to work towards near full extension and normal knee function.    Time  4    Period  Weeks    Status  New    Target Date  09/25/19      PT SHORT TERM GOAL #5   Title  Pt will understand RICE in order to minimize pain and swelling in RT LE    Time  8    Period  Weeks    Status  New    Target Date  09/25/19        PT Long Term Goals - 08/28/19 1949      PT LONG TERM GOAL #1   Title  Pt will improve FOTO score to no more than 49 % limited to demo improved functional mobility.    Baseline  64%    Time  8    Period  Weeks    Status  New    Target Date  10/23/19      PT LONG TERM GOAL #2   Title  Pt will be able to perform light recreational activites in the home and ADLs  with no more than min occasional difficulty    Time  8    Period  Weeks    Status  New    Target Date  10/23/19      PT LONG TERM GOAL #3   Title  Pt will be able to go up and down 12 stairs in his home with min difficulty, reciprocal and using 1 rail.    Time  8    Period  Weeks    Status  New    Target Date  10/23/19      PT LONG TERM GOAL #4   Title  Pt will demo 5/5 knee strength as well as hip abduction for normal, safe gait mechanics    Time  8    Period  Weeks    Status  New    Target Date  10/23/19      PT LONG TERM GOAL #5   Title  Pt will be able to extend knee AROM to lacking no more than 10 deg.    Time  8    Period  Weeks    Status  New    Target Date  10/23/19      Additional Long Term Goals   Additional Long Term Goals  Yes      PT LONG TERM GOAL #6   Title  Pt will be I with final HEP upong discharge for long term mobility and health.    Time  8    Period  Weeks    Status  New    Target Date  10/23/19            Plan - 09/07/19 1457    Clinical Impression Statement  Patient's range of motion  continues to improve and his quad control also seems to be improving. He does exhibit continued swelling and this is most likely limiting some of his motion. Patient is to continue as PWB on right with cructch so strengthening exercises are limited to avoid  weight bearing tasks. He would benefit from continued skilled PT to progress motion and strength to allow for return to walking and PLOF.    PT Treatment/Interventions  ADLs/Self Care Home Management;Stair training;Functional mobility training;Cryotherapy;Neuromuscular re-education;Balance training;Therapeutic exercise;Therapeutic activities;Manual lymph drainage;Taping;Vasopneumatic Device;Manual techniques;Passive range of motion;Patient/family education;DME Instruction;Gait training;Electrical Stimulation    PT Next Visit Plan  Assess HEP and progress PRN, ROM and strength as tol, recumbant, edema mgmt    PT Home Exercise Plan  QS, towel slide flexion, SLR, stand wgt shift, mini squats and sit to stand no UE assist if able, calf stretch, seated or supine hamstring stretch    Consulted and Agree with Plan of Care  Patient       Patient will benefit from skilled therapeutic intervention in order to improve the following deficits and impairments:  Difficulty walking, Increased fascial restricitons, Decreased range of motion, Pain, Decreased skin integrity, Decreased balance, Decreased scar mobility, Hypomobility, Impaired flexibility, Decreased strength, Decreased mobility, Increased edema, Postural dysfunction  Visit Diagnosis: S/P right knee arthroscopy  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 S/P right TK rev 08/22/2019  . Status post revision of total knee, right 06/15/2019  . 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 High risk medication use 11/08/2018  . Infected right TKA 09/10/2018  . S/P left TKA 06/28/2018  . S/P TKR (total knee replacement), right 05/31/2018  . Osteoarthritis of right knee 05/09/2018  . Tubular adenoma of colon 10/18/2017  . 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 Diabetic nephropathy associated with type 2 diabetes mellitus ([place]) 10/13/2016  . Permanent atrial fibrillation ([place]) 10/13/2016  . Health examination of defined subpopulation 11/15/2015  . Polycythemia vera ([place]) 10/09/2014  . Hyperlipidemia associated with type 2 diabetes mellitus ([place]) 09/17/2014  . Hypertension associated with diabetes ([place]) 09/17/2014  . Diabetes mellitus ([place]) 10/31/2013  . Hx-TIA (transient ischemic attack) 09/05/2012  . GERD (gastroesophageal reflux disease) 08/17/2011  . Obesity (BMI 30-39.9) 08/17/2011  . Psoriatic arthritis ([place]) 08/17/2011  . Obstructive sleep apnea 08/05/2009    Hilda Blades, PT, DPT, LAT, ATC 09/07/19  3:05 PM Phone: [phone] Fax: [facility] [address] [place], [name], [address] Phone: [phone]   Fax:  [phone]  Name: [name]. MRN: [id number] Date of Birth: August 03, 1961

## 2019-09-08 ENCOUNTER — Encounter: Payer: Self-pay | Admitting: Physical Therapy

## 2019-09-08 ENCOUNTER — Other Ambulatory Visit: Payer: Self-pay

## 2019-09-08 ENCOUNTER — Ambulatory Visit: Payer: 59 | Admitting: Physical Therapy

## 2019-09-08 DIAGNOSIS — R262 Difficulty in walking, not elsewhere classified: Secondary | ICD-10-CM

## 2019-09-08 DIAGNOSIS — Z96651 Presence of right artificial knee joint: Secondary | ICD-10-CM

## 2019-09-08 DIAGNOSIS — M6281 Muscle weakness (generalized): Secondary | ICD-10-CM | POA: Diagnosis not present

## 2019-09-08 DIAGNOSIS — Z9889 Other specified postprocedural states: Secondary | ICD-10-CM

## 2019-09-08 DIAGNOSIS — R600 Localized edema: Secondary | ICD-10-CM

## 2019-09-08 NOTE — Therapy (Signed)
[place], [name], [address] Phone: [phone]   Fax:  [phone]  Physical Therapy Treatment  Patient Details  Name: [name]. MRN: [id number] Date of Birth: [birth year] Referring Provider (PT): Dr. [name]   Encounter Date: 09/08/2019  PT End of Session - 09/08/19 1052    Visit Number  5    Number of Visits  24    Date for PT Re-Evaluation  10/23/19    Authorization Type  [facility] U[facility]    PT Start Time  1045    PT Stop Time  1140    PT Time Calculation (min)  55 min    Activity Tolerance  Patient tolerated treatment well    Behavior During Therapy  [facility] for tasks assessed/performed       Past Medical History:  Diagnosis Date  . Arthritis   . Diabetes mellitus without complication ([place])    TYPE 2  . Diverticulosis   . Dyslipidemia   . GERD (gastroesophageal reflux disease)   . Herpes labialis   . Hypertension   . Longstanding persistent atrial fibrillation ([place])   . Metabolic syndrome   . Obesity   . Proteinuria   . Psoriasis   . Seborrheic dermatitis   . Sleep apnea    very compliant with CPAP, PT NEEDS TO BRING OWN MACHINE  . TIA (transient ischemic attack) 2009    Past Surgical History:  Procedure Laterality Date  . CARDIOVERSION N/A 12/02/2016   Procedure: CARDIOVERSION;  Surgeon: Pixie Casino, MD;  Location: [facility] ENDOSCOPY;  Service: Cardiovascular;  Laterality: N/A;  . COLONOSCOPY    . EXCISIONAL TOTAL KNEE ARTHROPLASTY WITH ANTIBIOTIC SPACERS Right 03/28/2019   Procedure: EXCISIONAL TOTAL KNEE ARTHROPLASTY WITH ANTIBIOTIC SPACERS;  Surgeon: [name], MD;  Location: [facility];  Service: Orthopedics;  Laterality: Right;  90 mins  . I & D KNEE WITH POLY EXCHANGE Right 09/10/2018   Procedure: Right Knee Arthroplasty IRRIGATION AND DEBRIDEMENT KNEE WITH POLY EXCHANGE;  Surgeon: [name], MD;  Location: [facility];  Service: Orthopedics;  Laterality: Right;  . JOINT REPLACEMENT   11/03/10   LT HIP  . PFO occluder cardiac valve  2006   Dr. Einar Gip, (hole in heart)  . REIMPLANTATION OF TOTAL KNEE Right 06/15/2019   Procedure: Resection of antibiotic spacer and  irrigation and debridment and placement of new antibiotic spacer and components;  Surgeon: [name], MD;  Location: [facility];  Service: Orthopedics;  Laterality: Right;  90 mins  . REIMPLANTATION OF TOTAL KNEE Right 08/22/2019   Procedure: REIMPLANTATION OF TOTAL KNEE;  Surgeon: [name], MD;  Location: [facility];  Service: Orthopedics;  Laterality: Right;  120 mins  . TONSILLECTOMY AND ADENOIDECTOMY    . TOTAL HIP ARTHROPLASTY Left   . TOTAL KNEE ARTHROPLASTY Right 05/31/2018   Procedure: RIGHT TOTAL KNEE ARTHROPLASTY;  Surgeon: [name], MD;  Location: [facility];  Service: Orthopedics;  Laterality: Right;  70 mins  . TOTAL KNEE ARTHROPLASTY Left 06/28/2018   Procedure: LEFT TOTAL KNEE ARTHROPLASTY;  Surgeon: [name], MD;  Location: [facility];  Service: Orthopedics;  Laterality: Left;  70 mins  . WISDOM TOOTH EXTRACTION      There were no vitals filed for this visit.  Subjective Assessment - 09/08/19 1050    Subjective  Patient reports he is sore and more stiff today.    Currently in Pain?  Yes    Pain Score  3  Pain Location  Knee    Pain Orientation  Right    Pain Descriptors / Indicators  Sore    Pain Type  Surgical pain    Pain Onset  1 to 4 weeks ago    Pain Frequency  Constant         [facility] PT Assessment - 09/08/19 0001      AROM   Right Knee Extension  8    Right Knee Flexion  105                   [facility] Adult PT Treatment/Exercise - 09/08/19 0001      Knee/Hip Exercises: Stretches   Passive Hamstring Stretch  2 reps;30 seconds    Passive Hamstring Stretch Limitations  supine    Gastroc Stretch  2 reps;30 seconds      Knee/Hip Exercises: Aerobic   Nustep  6 min LE only L5       Vasopneumatic   Number Minutes Vasopneumatic   15 minutes    Vasopnuematic  Location   Knee    Vasopneumatic Pressure  Medium    Vasopneumatic Temperature   34      Manual Therapy   Manual Therapy  Joint mobilization;Passive ROM;Soft tissue mobilization    Manual therapy comments  Focused on improving knee motion    Joint Mobilization  Gentle AP and rotational tibiofemoral joint mobs in supine and seated, PF joint mobs in all directions    Soft tissue mobilization  Right calf     Passive ROM  Knee flexion and extension stretching in seated and supine, supine hamstring stretch              PT Education - 09/08/19 1051    Education Details  HEP    Person(s) Educated  Patient    Methods  Explanation;Demonstration;Verbal cues    Comprehension  Verbalized understanding;Returned demonstration;Verbal cues required;Need further instruction       PT Short Term Goals - 08/28/19 1929      PT SHORT TERM GOAL #1   Title  Pt will be I with initial HEP for Rt knee ROM and strength, balance    Time  4    Period  Weeks    Status  New    Target Date  09/25/19      PT SHORT TERM GOAL #2   Title  Pt will be able to walk with/without least restricted assistive device in clinic with min noticeable limp, improved heel strike.    Time  4    Period  Weeks    Status  New    Target Date  09/25/19      PT SHORT TERM GOAL #3   Title  Pt will be able to sit with knee flexed to 95 deg for improved sit to stand symmetry and ease    Time  6    Period  Weeks    Status  New    Target Date  09/25/19      PT SHORT TERM GOAL #4   Title  Pt will be able to demo passive knee extension to no more than 10 deg. to work towards near full extension and normal knee function.    Time  4    Period  Weeks    Status  New    Target Date  09/25/19      PT SHORT TERM GOAL #5   Title  Pt will understand RICE in order to minimize pain  and swelling in RT LE    Time  8    Period  Weeks    Status  New    Target Date  09/25/19        PT Long Term Goals - 08/28/19 1949      PT LONG  TERM GOAL #1   Title  Pt will improve FOTO score to no more than 49 % limited to demo improved functional mobility.    Baseline  64%    Time  8    Period  Weeks    Status  New    Target Date  10/23/19      PT LONG TERM GOAL #2   Title  Pt will be able to perform light recreational activites in the home and ADLs  with no more than min occasional difficulty    Time  8    Period  Weeks    Status  New    Target Date  10/23/19      PT LONG TERM GOAL #3   Title  Pt will be able to go up and down 12 stairs in his home with min difficulty, reciprocal and using 1 rail.    Time  8    Period  Weeks    Status  New    Target Date  10/23/19      PT LONG TERM GOAL #4   Title  Pt will demo 5/5 knee strength as well as hip abduction for normal, safe gait mechanics    Time  8    Period  Weeks    Status  New    Target Date  10/23/19      PT LONG TERM GOAL #5   Title  Pt will be able to extend knee AROM to lacking no more than 10 deg.    Time  8    Period  Weeks    Status  New    Target Date  10/23/19      Additional Long Term Goals   Additional Long Term Goals  Yes      PT LONG TERM GOAL #6   Title  Pt will be I with final HEP upong discharge for long term mobility and health.    Time  8    Period  Weeks    Status  New    Target Date  10/23/19            Plan - 09/08/19 1055    Clinical Impression Statement  Patient continues to do well. He reported increased stiffness this visit which exhibited with his AROM. He continues to demonstrate increased swelling but no signs of infection. He would benefit from continued skilled PT to porgress motion and strength to allow for improved walking and return to PLOF.    PT Treatment/Interventions  ADLs/Self Care Home Management;Stair training;Functional mobility training;Cryotherapy;Neuromuscular re-education;Balance training;Therapeutic exercise;Therapeutic activities;Manual lymph drainage;Taping;Vasopneumatic Device;Manual  techniques;Passive range of motion;Patient/family education;DME Instruction;Gait training;Electrical Stimulation    PT Next Visit Plan  Assess HEP and progress PRN, ROM and strength as tol, recumbant, edema mgmt    PT Home Exercise Plan  QS, towel slide flexion, SLR, stand wgt shift, mini squats and sit to stand no UE assist if able, calf stretch, seated or supine hamstring stretch    Consulted and Agree with Plan of Care  Patient       Patient will benefit from skilled therapeutic intervention in order to improve the following deficits and impairments:  Difficulty walking,  Increased fascial restricitons, Decreased range of motion, Pain, Decreased skin integrity, Decreased balance, Decreased scar mobility, Hypomobility, Impaired flexibility, Decreased strength, Decreased mobility, Increased edema, Postural dysfunction  Visit Diagnosis: S/P right knee arthroscopy  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 S/P right TK rev 08/22/2019  . Status post revision of total knee, right 06/15/2019  . 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 High risk medication use 11/08/2018  . Infected right TKA 09/10/2018  . S/P left TKA 06/28/2018  . S/P TKR (total knee replacement), right 05/31/2018  . Osteoarthritis of right knee 05/09/2018  . Tubular adenoma of colon 10/18/2017  . 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 Diabetic nephropathy associated with type 2 diabetes mellitus ([place]) 10/13/2016  . Permanent atrial fibrillation ([place]) 10/13/2016  . Health  examination of defined subpopulation 11/15/2015  . Polycythemia vera ([place]) 10/09/2014  . Hyperlipidemia associated with type 2 diabetes mellitus ([place]) 09/17/2014  . Hypertension associated with diabetes ([place]) 09/17/2014  . Diabetes mellitus ([place]) 10/31/2013  . Hx-TIA (transient ischemic attack) 09/05/2012  . GERD (gastroesophageal reflux disease) 08/17/2011  . Obesity (BMI 30-39.9) 08/17/2011  . Psoriatic arthritis ([place]) 08/17/2011  . Obstructive sleep apnea 08/05/2009    Hilda Blades, PT, DPT, LAT, ATC 09/08/19  12:23 PM Phone: [phone] Fax: [facility] [address] [place], [name], [address] Phone: [phone]   Fax:  [phone]  Name: [name]. MRN: [id number] Date of Birth: [birth year]

## 2019-09-11 ENCOUNTER — Other Ambulatory Visit: Payer: Self-pay

## 2019-09-11 ENCOUNTER — Encounter: Payer: Self-pay | Admitting: Physical Therapy

## 2019-09-11 ENCOUNTER — Ambulatory Visit: Payer: 59 | Admitting: Physical Therapy

## 2019-09-11 DIAGNOSIS — R262 Difficulty in walking, not elsewhere classified: Secondary | ICD-10-CM

## 2019-09-11 DIAGNOSIS — Z96651 Presence of right artificial knee joint: Secondary | ICD-10-CM | POA: Diagnosis not present

## 2019-09-11 DIAGNOSIS — R600 Localized edema: Secondary | ICD-10-CM

## 2019-09-11 DIAGNOSIS — M6281 Muscle weakness (generalized): Secondary | ICD-10-CM | POA: Diagnosis not present

## 2019-09-11 DIAGNOSIS — Z9889 Other specified postprocedural states: Secondary | ICD-10-CM | POA: Diagnosis not present

## 2019-09-11 NOTE — Therapy (Signed)
[place], [name], [address] Phone: [phone]   Fax:  [phone]  Physical Therapy Treatment  Patient Details  Name: [name]. MRN: [id number] Date of Birth: [birth year] Referring Provider (PT): Dr. [name]   Encounter Date: 09/11/2019  PT End of Session - 09/11/19 0958    Visit Number  6    Number of Visits  24    Date for PT Re-Evaluation  10/23/19    Authorization Type  [facility] [employer]    PT Start Time  0915    PT Stop Time  1010    PT Time Calculation (min)  55 min    Activity Tolerance  Patient tolerated treatment well    Behavior During Therapy  [facility] for tasks assessed/performed       Past Medical History:  Diagnosis Date  . Arthritis   . Diabetes mellitus without complication ([place])    TYPE 2  . Diverticulosis   . Dyslipidemia   . GERD (gastroesophageal reflux disease)   . Herpes labialis   . Hypertension   . Longstanding persistent atrial fibrillation ([place])   . Metabolic syndrome   . Obesity   . Proteinuria   . Psoriasis   . Seborrheic dermatitis   . Sleep apnea    very compliant with CPAP, PT NEEDS TO BRING OWN MACHINE  . TIA (transient ischemic attack) 2009    Past Surgical History:  Procedure Laterality Date  . CARDIOVERSION N/A 12/02/2016   Procedure: CARDIOVERSION;  Surgeon: Pixie Casino, MD;  Location: [facility] ENDOSCOPY;  Service: Cardiovascular;  Laterality: N/A;  . COLONOSCOPY    . EXCISIONAL TOTAL KNEE ARTHROPLASTY WITH ANTIBIOTIC SPACERS Right 03/28/2019   Procedure: EXCISIONAL TOTAL KNEE ARTHROPLASTY WITH ANTIBIOTIC SPACERS;  Surgeon: [name], MD;  Location: [facility] ORS;  Service: Orthopedics;  Laterality: Right;  90 mins  . I & D KNEE WITH POLY EXCHANGE Right 09/10/2018   Procedure: Right Knee Arthroplasty IRRIGATION AND DEBRIDEMENT KNEE WITH POLY EXCHANGE;  Surgeon: [name], MD;  Location: [facility] ORS;  Service: Orthopedics;  Laterality: Right;  . JOINT  REPLACEMENT  11/03/10   LT HIP  . PFO occluder cardiac valve  2006   Dr. Einar Gip, (hole in heart)  . REIMPLANTATION OF TOTAL KNEE Right 06/15/2019   Procedure: Resection of antibiotic spacer and  irrigation and debridment and placement of new antibiotic spacer and components;  Surgeon: [name], MD;  Location: [facility];  Service: Orthopedics;  Laterality: Right;  90 mins  . REIMPLANTATION OF TOTAL KNEE Right 08/22/2019   Procedure: REIMPLANTATION OF TOTAL KNEE;  Surgeon: [name], MD;  Location: [facility];  Service: Orthopedics;  Laterality: Right;  120 mins  . TONSILLECTOMY AND ADENOIDECTOMY    . TOTAL HIP ARTHROPLASTY Left   . TOTAL KNEE ARTHROPLASTY Right 05/31/2018   Procedure: RIGHT TOTAL KNEE ARTHROPLASTY;  Surgeon: [name], MD;  Location: [facility];  Service: Orthopedics;  Laterality: Right;  70 mins  . TOTAL KNEE ARTHROPLASTY Left 06/28/2018   Procedure: LEFT TOTAL KNEE ARTHROPLASTY;  Surgeon: [name], MD;  Location: [facility];  Service: Orthopedics;  Laterality: Left;  70 mins  . WISDOM TOOTH EXTRACTION      There were no vitals filed for this visit.  Subjective Assessment - 09/11/19 0919    Subjective  Patient reports he may have over-did it this past Saturday when he went shopping and did a lot of walking.    Currently in Pain?  Yes    Pain Score  3     Pain Location  Knee    Pain Orientation  Right    Pain Descriptors / Indicators  Dull    Pain Type  Surgical pain    Pain Onset  1 to 4 weeks ago    Pain Frequency  Constant         [facility] PT Assessment - 09/11/19 0001      Assessment   Medical Diagnosis  Rt TKA     Referring Provider (PT)  Dr. [name]    Onset Date/Surgical Date  08/22/19    Next MD Visit  10/06/2019      Restrictions   Weight Bearing Restrictions  Yes    RLE Weight Bearing  Partial weight bearing      AROM   Right Knee Extension  6    Right Knee Flexion  110                   [facility] Adult PT Treatment/Exercise -  09/11/19 0001      Exercises   Exercises  Knee/Hip      Knee/Hip Exercises: Stretches   Passive Hamstring Stretch  2 reps;30 seconds    Passive Hamstring Stretch Limitations  supine    Gastroc Stretch  2 reps;30 seconds    Gastroc Stretch Limitations  strap      Knee/Hip Exercises: Aerobic   Recumbent Bike  5 min for knee flexion ROM    Nustep  6 min LE only L5       Knee/Hip Exercises: Supine   Quad Sets  10 reps   3 sec hold   Straight Leg Raises  2 sets;10 reps      Vasopneumatic   Number Minutes Vasopneumatic   15 minutes    Vasopnuematic Location   Knee    Vasopneumatic Pressure  Medium    Vasopneumatic Temperature   34      Manual Therapy   Manual Therapy  Joint mobilization;Passive ROM;Soft tissue mobilization    Manual therapy comments  Focused on improving knee motion    Joint Mobilization  Gentle AP and rotational tibiofemoral joint mobs in supine and seated, PF joint mobs in all directions    Soft tissue mobilization  Right calf, IASTM to right quad    Passive ROM  Knee flexion and extension stretching in seated and supine, supine hamstring stretch              PT Education - 09/11/19 0920    Education Details  HEP    Person(s) Educated  Patient    Methods  Explanation;Demonstration;Verbal cues    Comprehension  Verbalized understanding;Returned demonstration;Verbal cues required;Need further instruction       PT Short Term Goals - 08/28/19 1929      PT SHORT TERM GOAL #1   Title  Pt will be I with initial HEP for Rt knee ROM and strength, balance    Time  4    Period  Weeks    Status  New    Target Date  09/25/19      PT SHORT TERM GOAL #2   Title  Pt will be able to walk with/without least restricted assistive device in clinic with min noticeable limp, improved heel strike.    Time  4    Period  Weeks    Status  New    Target Date  09/25/19      PT SHORT TERM  GOAL #3   Title  Pt will be able to sit with knee flexed to 95 deg for improved  sit to stand symmetry and ease    Time  6    Period  Weeks    Status  New    Target Date  09/25/19      PT SHORT TERM GOAL #4   Title  Pt will be able to demo passive knee extension to no more than 10 deg. to work towards near full extension and normal knee function.    Time  4    Period  Weeks    Status  New    Target Date  09/25/19      PT SHORT TERM GOAL #5   Title  Pt will understand RICE in order to minimize pain and swelling in RT LE    Time  8    Period  Weeks    Status  New    Target Date  09/25/19        PT Long Term Goals - 08/28/19 1949      PT LONG TERM GOAL #1   Title  Pt will improve FOTO score to no more than 49 % limited to demo improved functional mobility.    Baseline  64%    Time  8    Period  Weeks    Status  New    Target Date  10/23/19      PT LONG TERM GOAL #2   Title  Pt will be able to perform light recreational activites in the home and ADLs  with no more than min occasional difficulty    Time  8    Period  Weeks    Status  New    Target Date  10/23/19      PT LONG TERM GOAL #3   Title  Pt will be able to go up and down 12 stairs in his home with min difficulty, reciprocal and using 1 rail.    Time  8    Period  Weeks    Status  New    Target Date  10/23/19      PT LONG TERM GOAL #4   Title  Pt will demo 5/5 knee strength as well as hip abduction for normal, safe gait mechanics    Time  8    Period  Weeks    Status  New    Target Date  10/23/19      PT LONG TERM GOAL #5   Title  Pt will be able to extend knee AROM to lacking no more than 10 deg.    Time  8    Period  Weeks    Status  New    Target Date  10/23/19      Additional Long Term Goals   Additional Long Term Goals  Yes      PT LONG TERM GOAL #6   Title  Pt will be I with final HEP upong discharge for long term mobility and health.    Time  8    Period  Weeks    Status  New    Target Date  10/23/19            Plan - 09/11/19 0959    Clinical Impression  Statement  Patient is continuing to progress well with his range of motion. The swelling of the knee continues to persist and this is most likely limiting his knee flexion even more.  There are no signs of infection.  He would benefit from continued skilled PT to porgress motion and strength to allow for improved walking and return to PLOF.    PT Treatment/Interventions  ADLs/Self Care Home Management;Stair training;Functional mobility training;Cryotherapy;Neuromuscular re-education;Balance training;Therapeutic exercise;Therapeutic activities;Manual lymph drainage;Taping;Vasopneumatic Device;Manual techniques;Passive range of motion;Patient/family education;DME Instruction;Gait training;Electrical Stimulation    PT Next Visit Plan  Assess HEP and progress PRN, ROM and strength as tol, recumbant, edema mgmt    PT Home Exercise Plan  QS, towel slide flexion, SLR, stand wgt shift, mini squats and sit to stand no UE assist if able, calf stretch, seated or supine hamstring stretch    Consulted and Agree with Plan of Care  Patient       Patient will benefit from skilled therapeutic intervention in order to improve the following deficits and impairments:  Difficulty walking, Increased fascial restricitons, Decreased range of motion, Pain, Decreased skin integrity, Decreased balance, Decreased scar mobility, Hypomobility, Impaired flexibility, Decreased strength, Decreased mobility, Increased edema, Postural dysfunction  Visit Diagnosis: S/P right knee arthroscopy  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 S/P right TK rev 08/22/2019  . Status post revision of total knee, right 06/15/2019  . 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 High risk medication use 11/08/2018  . Infected right TKA 09/10/2018  . S/P left TKA 06/28/2018  . S/P TKR (total knee replacement), right 05/31/2018  . Osteoarthritis of right knee 05/09/2018  . Tubular adenoma of colon 10/18/2017  . 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 Diabetic nephropathy associated with type 2 diabetes mellitus ([place]) 10/13/2016  . Permanent atrial fibrillation ([place]) 10/13/2016  . Health examination of defined subpopulation 11/15/2015  . Polycythemia vera ([place]) 10/09/2014  . Hyperlipidemia associated with type 2 diabetes mellitus ([place]) 09/17/2014  . Hypertension associated with diabetes ([place]) 09/17/2014  . Diabetes mellitus ([place]) 10/31/2013  . Hx-TIA (transient ischemic attack) 09/05/2012  . GERD (gastroesophageal reflux disease) 08/17/2011  . Obesity (BMI 30-39.9) 08/17/2011  . Psoriatic arthritis ([place]) 08/17/2011  . Obstructive sleep apnea 08/05/2009    Hilda Blades, PT, DPT, LAT, ATC 09/11/19  10:23 AM Phone: [phone] Fax: [facility] [address] [place], [name], [address] Phone: [phone]   Fax:  [phone]  Name: [name]. MRN: [id number] Date of Birth: [birth year]

## 2019-09-12 DIAGNOSIS — G4733 Obstructive sleep apnea (adult) (pediatric): Secondary | ICD-10-CM | POA: Diagnosis not present

## 2019-09-14 ENCOUNTER — Ambulatory Visit: Payer: 59 | Admitting: Physical Therapy

## 2019-09-14 ENCOUNTER — Other Ambulatory Visit: Payer: Self-pay

## 2019-09-14 ENCOUNTER — Encounter: Payer: Self-pay | Admitting: Physical Therapy

## 2019-09-14 DIAGNOSIS — Z96651 Presence of right artificial knee joint: Secondary | ICD-10-CM

## 2019-09-14 DIAGNOSIS — R262 Difficulty in walking, not elsewhere classified: Secondary | ICD-10-CM

## 2019-09-14 DIAGNOSIS — Z9889 Other specified postprocedural states: Secondary | ICD-10-CM

## 2019-09-14 DIAGNOSIS — M6281 Muscle weakness (generalized): Secondary | ICD-10-CM

## 2019-09-14 DIAGNOSIS — R600 Localized edema: Secondary | ICD-10-CM

## 2019-09-14 NOTE — Therapy (Signed)
[place], [name], [address] Phone: [phone]   Fax:  [phone]  Physical Therapy Treatment  Patient Details  Name: [name]. MRN: [id number] Date of Birth: 10-11-1960 Referring Provider (PT): Dr. [name]   Encounter Date: 09/14/2019  PT End of Session - 09/14/19 0920    Visit Number  7    Number of Visits  24    Date for PT Re-Evaluation  10/23/19    Authorization Type  [facility]    PT Start Time  0915    PT Stop Time  1010    PT Time Calculation (min)  55 min    Activity Tolerance  Patient tolerated treatment well    Behavior During Therapy  [facility] for tasks assessed/performed       Past Medical History:  Diagnosis Date  . Arthritis   . Diabetes mellitus without complication ([place])    TYPE 2  . Diverticulosis   . Dyslipidemia   . GERD (gastroesophageal reflux disease)   . Herpes labialis   . Hypertension   . Longstanding persistent atrial fibrillation ([place])   . Metabolic syndrome   . Obesity   . Proteinuria   . Psoriasis   . Seborrheic dermatitis   . Sleep apnea    very compliant with CPAP, PT NEEDS TO BRING OWN MACHINE  . TIA (transient ischemic attack) 2009    Past Surgical History:  Procedure Laterality Date  . CARDIOVERSION N/A 12/02/2016   Procedure: CARDIOVERSION;  Surgeon: Pixie Casino, MD;  Location: [facility] ENDOSCOPY;  Service: Cardiovascular;  Laterality: N/A;  . COLONOSCOPY    . EXCISIONAL TOTAL KNEE ARTHROPLASTY WITH ANTIBIOTIC SPACERS Right 03/28/2019   Procedure: EXCISIONAL TOTAL KNEE ARTHROPLASTY WITH ANTIBIOTIC SPACERS;  Surgeon: [name], MD;  [facility]: [facility];  Service: Orthopedics;  Laterality: Right;  90 mins  . I & D KNEE WITH POLY EXCHANGE Right 09/10/2018   Procedure: Right Knee Arthroplasty IRRIGATION AND DEBRIDEMENT KNEE WITH POLY EXCHANGE;  Surgeon: [name], MD;  Location: [facility];  Service: Orthopedics;  Laterality: Right;  . JOINT  REPLACEMENT  11/03/10   LT HIP  . PFO occluder cardiac valve  2006   Dr. Einar Gip, (hole in heart)  . REIMPLANTATION OF TOTAL KNEE Right 06/15/2019   Procedure: Resection of antibiotic spacer and  irrigation and debridment and placement of new antibiotic spacer and components;  Surgeon: [name], MD;  Location: [facility];  Service: Orthopedics;  Laterality: Right;  90 mins  . REIMPLANTATION OF TOTAL KNEE Right 08/22/2019   Procedure: REIMPLANTATION OF TOTAL KNEE;  Surgeon: [name], MD;  Location: [facility];  Service: Orthopedics;  Laterality: Right;  120 mins  . TONSILLECTOMY AND ADENOIDECTOMY    . TOTAL HIP ARTHROPLASTY Left   . TOTAL KNEE ARTHROPLASTY Right 05/31/2018   Procedure: RIGHT TOTAL KNEE ARTHROPLASTY;  Surgeon: [name], MD;  Location: [facility];  Service: Orthopedics;  Laterality: Right;  70 mins  . TOTAL KNEE ARTHROPLASTY Left 06/28/2018   Procedure: LEFT TOTAL KNEE ARTHROPLASTY;  Surgeon: [name], MD;  Location: [facility];  Service: Orthopedics;  Laterality: Left;  70 mins  . WISDOM TOOTH EXTRACTION      There were no vitals filed for this visit.  Subjective Assessment - 09/14/19 0917    Subjective  Patient reports he was sore following last visit and he did a little too much standing yesterday so he is sore today.    Currently in  Pain?  Yes    Pain Score  3     Pain Location  Knee    Pain Orientation  Right    Pain Descriptors / Indicators  Sore    Pain Type  Surgical pain    Pain Onset  1 to 4 weeks ago    Pain Frequency  Constant         [facility] PT Assessment - 09/14/19 0001      AROM   Right Knee Extension  6    Right Knee Flexion  110                   [facility] Adult PT Treatment/Exercise - 09/14/19 0001      Knee/Hip Exercises: Stretches   Passive Hamstring Stretch  2 reps;30 seconds    Passive Hamstring Stretch Limitations  supine    Quad Stretch  3 reps;30 seconds    Quad Stretch Limitations  supine passive    Hip Flexor Stretch  3  reps;30 seconds    Hip Flexor Stretch Limitations  supine edge of mat, passive      Knee/Hip Exercises: Aerobic   Nustep  5 min LE only L7       Knee/Hip Exercises: Supine   Quad Sets  10 reps   3 sec hold   Straight Leg Raises  10 reps      Modalities   Modalities  Vasopneumatic      Vasopneumatic   Number Minutes Vasopneumatic   15 minutes    Vasopnuematic Location   Knee    Vasopneumatic Pressure  High    Vasopneumatic Temperature   34      Manual Therapy   Manual Therapy  Joint mobilization;Passive ROM;Soft tissue mobilization    Manual therapy comments  Focused on improving knee motion    Joint Mobilization  Gentle AP and rotational tibiofemoral joint mobs in supine and seated, PF joint mobs in all directions    Soft tissue mobilization  Right calf and quad    Passive ROM  Knee flexion and extension stretching in seated and supine, supine hamstring stretch              PT Education - 09/14/19 0919    Education Details  HEP,    Person(s) Educated  Patient    Methods  Explanation;Demonstration;Verbal cues;Handout;Tactile cues    Comprehension  Verbalized understanding;Returned demonstration;Verbal cues required;Tactile cues required;Need further instruction       PT Short Term Goals - 08/28/19 1929      PT SHORT TERM GOAL #1   Title  Pt will be I with initial HEP for Rt knee ROM and strength, balance    Time  4    Period  Weeks    Status  New    Target Date  09/25/19      PT SHORT TERM GOAL #2   Title  Pt will be able to walk with/without least restricted assistive device in clinic with min noticeable limp, improved heel strike.    Time  4    Period  Weeks    Status  New    Target Date  09/25/19      PT SHORT TERM GOAL #3   Title  Pt will be able to sit with knee flexed to 95 deg for improved sit to stand symmetry and ease    Time  6    Period  Weeks    Status  New    Target Date  09/25/19      PT SHORT TERM GOAL #4   Title  Pt will be able to  demo passive knee extension to no more than 10 deg. to work towards near full extension and normal knee function.    Time  4    Period  Weeks    Status  New    Target Date  09/25/19      PT SHORT TERM GOAL #5   Title  Pt will understand RICE in order to minimize pain and swelling in RT LE    Time  8    Period  Weeks    Status  New    Target Date  09/25/19        PT Long Term Goals - 08/28/19 1949      PT LONG TERM GOAL #1   Title  Pt will improve FOTO score to no more than 49 % limited to demo improved functional mobility.    Baseline  64%    Time  8    Period  Weeks    Status  New    Target Date  10/23/19      PT LONG TERM GOAL #2   Title  Pt will be able to perform light recreational activites in the home and ADLs  with no more than min occasional difficulty    Time  8    Period  Weeks    Status  New    Target Date  10/23/19      PT LONG TERM GOAL #3   Title  Pt will be able to go up and down 12 stairs in his home with min difficulty, reciprocal and using 1 rail.    Time  8    Period  Weeks    Status  New    Target Date  10/23/19      PT LONG TERM GOAL #4   Title  Pt will demo 5/5 knee strength as well as hip abduction for normal, safe gait mechanics    Time  8    Period  Weeks    Status  New    Target Date  10/23/19      PT LONG TERM GOAL #5   Title  Pt will be able to extend knee AROM to lacking no more than 10 deg.    Time  8    Period  Weeks    Status  New    Target Date  10/23/19      Additional Long Term Goals   Additional Long Term Goals  Yes      PT LONG TERM GOAL #6   Title  Pt will be I with final HEP upong discharge for long term mobility and health.    Time  8    Period  Weeks    Status  New    Target Date  10/23/19            Plan - 09/14/19 0920    Clinical Impression Statement  Patient is doing well but seems to be mainly limited in progressing his ROM by swelling of the knee. He is tolerating the stretching and other exercises  well and exhibits good quad activation. He was encoruaged to try and avoid standing or walking for extended periods to reduce knee swelling and soreness. He would benefi from continued skilled PT to progress his range of motion and strength to allow for improved walking and return to prior level of function.  PT Treatment/Interventions  ADLs/Self Care Home Management;Stair training;Functional mobility training;Cryotherapy;Neuromuscular re-education;Balance training;Therapeutic exercise;Therapeutic activities;Manual lymph drainage;Taping;Vasopneumatic Device;Manual techniques;Passive range of motion;Patient/family education;DME Instruction;Gait training;Electrical Stimulation    PT Next Visit Plan  Assess HEP and progress PRN, ROM and strength as tol, recumbant, edema mgmt    PT Home Exercise Plan  QS, towel slide flexion, SLR, stand wgt shift, mini squats and sit to stand no UE assist if able, calf stretch, seated or supine hamstring stretch    Consulted and Agree with Plan of Care  Patient       Patient will benefit from skilled therapeutic intervention in order to improve the following deficits and impairments:  Difficulty walking, Increased fascial restricitons, Decreased range of motion, Pain, Decreased skin integrity, Decreased balance, Decreased scar mobility, Hypomobility, Impaired flexibility, Decreased strength, Decreased mobility, Increased edema, Postural dysfunction  Visit Diagnosis: S/P right knee arthroscopy  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 S/P right TK rev 08/22/2019  . Status post revision of total knee, right 06/15/2019  . 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 High risk medication use 11/08/2018  . Infected right TKA 09/10/2018  . S/P left TKA 06/28/2018  . S/P TKR (total knee replacement), right 05/31/2018  . Osteoarthritis of right knee 05/09/2018  . Tubular adenoma of colon 10/18/2017  . 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 Diabetic nephropathy associated with type 2 diabetes mellitus ([place]) 10/13/2016  . Permanent atrial fibrillation ([place]) 10/13/2016  . Health examination of defined subpopulation 11/15/2015  . Polycythemia vera ([place]) 10/09/2014  . Hyperlipidemia associated with type 2 diabetes mellitus ([place]) 09/17/2014  . Hypertension associated with diabetes ([place]) 09/17/2014  . Diabetes mellitus ([place]) 10/31/2013  . Hx-TIA (transient ischemic attack) 09/05/2012  . GERD (gastroesophageal reflux disease) 08/17/2011  . Obesity (BMI 30-39.9) 08/17/2011  . Psoriatic arthritis ([place]) 08/17/2011  . Obstructive sleep apnea 08/05/2009    Hilda Blades, PT, DPT, LAT, ATC 09/14/19  10:57 AM Phone: [phone] Fax: [facility] [address] [place], [name], [address] Phone: [phone]   Fax:  [phone]  Name: [name]. MRN: [id number] Date of Birth: [birth year]

## 2019-09-15 ENCOUNTER — Encounter: Payer: Self-pay | Admitting: Physical Therapy

## 2019-09-15 ENCOUNTER — Ambulatory Visit: Payer: 59 | Admitting: Physical Therapy

## 2019-09-15 DIAGNOSIS — Z96651 Presence of right artificial knee joint: Secondary | ICD-10-CM | POA: Diagnosis not present

## 2019-09-15 DIAGNOSIS — M6281 Muscle weakness (generalized): Secondary | ICD-10-CM | POA: Diagnosis not present

## 2019-09-15 DIAGNOSIS — R600 Localized edema: Secondary | ICD-10-CM | POA: Diagnosis not present

## 2019-09-15 DIAGNOSIS — R262 Difficulty in walking, not elsewhere classified: Secondary | ICD-10-CM | POA: Diagnosis not present

## 2019-09-15 DIAGNOSIS — Z471 Aftercare following joint replacement surgery: Secondary | ICD-10-CM | POA: Diagnosis not present

## 2019-09-15 DIAGNOSIS — Z9889 Other specified postprocedural states: Secondary | ICD-10-CM

## 2019-09-15 NOTE — Therapy (Signed)
[place], [name], [address] Phone: [phone]   Fax:  [phone]  Physical Therapy Treatment  Patient Details  Name: [name]. MRN: [id number] Date of Birth: 02-25-1961 Referring Provider (PT): Dr. [name]   Encounter Date: 09/15/2019  PT End of Session - 09/15/19 1132    Visit Number  8    Number of Visits  24    Date for PT Re-Evaluation  10/23/19    Authorization Type  [facility]    PT Start Time  1045    PT Stop Time  1135    PT Time Calculation (min)  50 min    Activity Tolerance  Patient tolerated treatment well    Behavior During Therapy  [facility] for tasks assessed/performed       Past Medical History:  Diagnosis Date  . Arthritis   . Diabetes mellitus without complication ([place])    TYPE 2  . Diverticulosis   . Dyslipidemia   . GERD (gastroesophageal reflux disease)   . Herpes labialis   . Hypertension   . Longstanding persistent atrial fibrillation ([place])   . Metabolic syndrome   . Obesity   . Proteinuria   . Psoriasis   . Seborrheic dermatitis   . Sleep apnea    very compliant with CPAP, PT NEEDS TO BRING OWN MACHINE  . TIA (transient ischemic attack) 2009    Past Surgical History:  Procedure Laterality Date  . CARDIOVERSION N/A 12/02/2016   Procedure: CARDIOVERSION;  Surgeon: Pixie Casino, MD;  Location: [facility] ENDOSCOPY;  Service: Cardiovascular;  Laterality: N/A;  . COLONOSCOPY    . EXCISIONAL TOTAL KNEE ARTHROPLASTY WITH ANTIBIOTIC SPACERS Right 03/28/2019   Procedure: EXCISIONAL TOTAL KNEE ARTHROPLASTY WITH ANTIBIOTIC SPACERS;  Surgeon: [name], MD;  Location: [facility] ORS;  Service: Orthopedics;  Laterality: Right;  90 mins  . I & D KNEE WITH POLY EXCHANGE Right 09/10/2018   Procedure: Right Knee Arthroplasty IRRIGATION AND DEBRIDEMENT KNEE WITH POLY EXCHANGE;  Surgeon: [name], MD;  Location: [facility] ORS;  Service: Orthopedics;  Laterality: Right;  . JOINT  REPLACEMENT  11/03/10   LT HIP  . PFO occluder cardiac valve  2006   Dr. Einar Gip, (hole in heart)  . REIMPLANTATION OF TOTAL KNEE Right 06/15/2019   Procedure: Resection of antibiotic spacer and  irrigation and debridment and placement of new antibiotic spacer and components;  Surgeon: [name], MD;  Location: [facility]S;  Service: Orthopedics;  Laterality: Right;  90 mins  . REIMPLANTATION OF TOTAL KNEE Right 08/22/2019   Procedure: REIMPLANTATION OF TOTAL KNEE;  Surgeon: [name], MD;  Location: [facility];  Service: Orthopedics;  Laterality: Right;  120 mins  . TONSILLECTOMY AND ADENOIDECTOMY    . TOTAL HIP ARTHROPLASTY Left   . TOTAL KNEE ARTHROPLASTY Right 05/31/2018   Procedure: RIGHT TOTAL KNEE ARTHROPLASTY;  Surgeon: [name], MD;  Location: [facility];  Service: Orthopedics;  Laterality: Right;  70 mins  . TOTAL KNEE ARTHROPLASTY Left 06/28/2018   Procedure: LEFT TOTAL KNEE ARTHROPLASTY;  Surgeon: [name], MD;  Location: [facility];  Service: Orthopedics;  Laterality: Left;  70 mins  . WISDOM TOOTH EXTRACTION      There were no vitals filed for this visit.  Subjective Assessment - 09/15/19 1052    Subjective  Patient reports he is doing well. He just saw the surgeon today and had fluid taken off the knee and was told he can start to wean  off the crutch to WBAT.    Currently in Pain?  Yes    Pain Score  3     Pain Location  Knee    Pain Orientation  Right    Pain Descriptors / Indicators  Sore    Pain Type  Surgical pain    Pain Onset  1 to 4 weeks ago    Pain Frequency  Constant                       [facility] Adult PT Treatment/Exercise - 09/15/19 0001      Exercises   Exercises  Knee/Hip      Knee/Hip Exercises: Stretches   Active Hamstring Stretch  2 reps;30 seconds    Active Hamstring Stretch Limitations  seated edge of mat    Gastroc Stretch  2 reps;30 seconds    Gastroc Stretch Limitations  standing      Knee/Hip Exercises: Aerobic   Nustep  6 min  LE only L5      Knee/Hip Exercises: Standing   Gait Training  Use of walking stick in left hand, cued for heel-toe progression, knee flexion with swing, posture      Knee/Hip Exercises: Supine   Quad Sets  10 reps   3 sec hold   Straight Leg Raises  10 reps      Modalities   Modalities  Vasopneumatic      Vasopneumatic   Number Minutes Vasopneumatic   15 minutes    Vasopnuematic Location   Knee    Vasopneumatic Pressure  High    Vasopneumatic Temperature   34      Manual Therapy   Manual Therapy  Joint mobilization;Passive ROM;Soft tissue mobilization    Manual therapy comments  Focused on improving knee motion    Joint Mobilization  Gentle AP and rotational tibiofemoral joint mobs in supine and seated, PF joint mobs in all directions    Soft tissue mobilization  Prone right calf and hamstring with roller    Passive ROM  Knee flexion and extension stretching in seated and supine, supine hamstring stretch                PT Short Term Goals - 08/28/19 1929      PT SHORT TERM GOAL #1   Title  Pt will be I with initial HEP for Rt knee ROM and strength, balance    Time  4    Period  Weeks    Status  New    Target Date  09/25/19      PT SHORT TERM GOAL #2   Title  Pt will be able to walk with/without least restricted assistive device in clinic with min noticeable limp, improved heel strike.    Time  4    Period  Weeks    Status  New    Target Date  09/25/19      PT SHORT TERM GOAL #3   Title  Pt will be able to sit with knee flexed to 95 deg for improved sit to stand symmetry and ease    Time  6    Period  Weeks    Status  New    Target Date  09/25/19      PT SHORT TERM GOAL #4   Title  Pt will be able to demo passive knee extension to no more than 10 deg. to work towards near full extension and normal knee function.    Time  4    Period  Weeks    Status  New    Target Date  09/25/19      PT SHORT TERM GOAL #5   Title  Pt will understand RICE in order to  minimize pain and swelling in RT LE    Time  8    Period  Weeks    Status  New    Target Date  09/25/19        PT Long Term Goals - 08/28/19 1949      PT LONG TERM GOAL #1   Title  Pt will improve FOTO score to no more than 49 % limited to demo improved functional mobility.    Baseline  64%    Time  8    Period  Weeks    Status  New    Target Date  10/23/19      PT LONG TERM GOAL #2   Title  Pt will be able to perform light recreational activites in the home and ADLs  with no more than min occasional difficulty    Time  8    Period  Weeks    Status  New    Target Date  10/23/19      PT LONG TERM GOAL #3   Title  Pt will be able to go up and down 12 stairs in his home with min difficulty, reciprocal and using 1 rail.    Time  8    Period  Weeks    Status  New    Target Date  10/23/19      PT LONG TERM GOAL #4   Title  Pt will demo 5/5 knee strength as well as hip abduction for normal, safe gait mechanics    Time  8    Period  Weeks    Status  New    Target Date  10/23/19      PT LONG TERM GOAL #5   Title  Pt will be able to extend knee AROM to lacking no more than 10 deg.    Time  8    Period  Weeks    Status  New    Target Date  10/23/19      Additional Long Term Goals   Additional Long Term Goals  Yes      PT LONG TERM GOAL #6   Title  Pt will be I with final HEP upong discharge for long term mobility and health.    Time  8    Period  Weeks    Status  New    Target Date  10/23/19            Plan - 09/15/19 1133    Clinical Impression Statement  Patient continues to do well with his exercises and exhibited less swelling this visit due to having fluid taken off knee prior to visit by surgeon. He was progressed to a walking stick and instructed on proper gait mechanics. He was encouraged to continue to monitor symptoms with standing and other activity to avoid exacerbation of knee pain and swelling, and to use compression on knee. He would benefit from  continued skilled PT to improve his ROM , strength, walking, and balance to return to work.    PT Treatment/Interventions  ADLs/Self Care Home Management;Stair training;Functional mobility training;Cryotherapy;Neuromuscular re-education;Balance training;Therapeutic exercise;Therapeutic activities;Manual lymph drainage;Taping;Vasopneumatic Device;Manual techniques;Passive range of motion;Patient/family education;DME Instruction;Gait training;Electrical Stimulation    PT Next Visit Plan  Assess HEP and  progress PRN, ROM and strength as tol, recumbant, edema mgmt    PT Home Exercise Plan  QS, towel slide flexion, SLR, stand wgt shift, mini squats and sit to stand no UE assist if able, calf stretch, seated or supine hamstring stretch    Consulted and Agree with Plan of Care  Patient       Patient will benefit from skilled therapeutic intervention in order to improve the following deficits and impairments:  Difficulty walking, Increased fascial restricitons, Decreased range of motion, Pain, Decreased skin integrity, Decreased balance, Decreased scar mobility, Hypomobility, Impaired flexibility, Decreased strength, Decreased mobility, Increased edema, Postural dysfunction  Visit Diagnosis: S/P right knee arthroscopy  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 S/P right TK rev 08/22/2019  . Status post revision of total knee, right 06/15/2019  . 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 High risk medication use 11/08/2018  . Infected right TKA 09/10/2018  . S/P left  TKA 06/28/2018  . S/P TKR (total knee replacement), right 05/31/2018  . Osteoarthritis of right knee 05/09/2018  . Tubular adenoma of colon 10/18/2017  . 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 Diabetic nephropathy associated with type 2 diabetes mellitus ([place]) 10/13/2016  . Permanent atrial fibrillation ([place]) 10/13/2016  . Health examination of defined subpopulation 11/15/2015  . Polycythemia vera ([place]) 10/09/2014  . Hyperlipidemia associated with type 2 diabetes mellitus ([place]) 09/17/2014  . Hypertension associated with diabetes ([place]) 09/17/2014  . Diabetes mellitus ([place]) 10/31/2013  . Hx-TIA (transient ischemic attack) 09/05/2012  . GERD (gastroesophageal reflux disease) 08/17/2011  . Obesity (BMI 30-39.9) 08/17/2011  . Psoriatic arthritis ([place]) 08/17/2011  . Obstructive sleep apnea 08/05/2009    Hilda Blades, PT, DPT, LAT, ATC 09/15/19  12:26 PM Phone: [phone] Fax: [facility] [address] [place], [name], [address] Phone: [phone]   Fax:  [phone]  Name: [name]. MRN: [id number] Date of Birth: 06/21/1961

## 2019-09-18 ENCOUNTER — Other Ambulatory Visit: Payer: Self-pay

## 2019-09-18 ENCOUNTER — Encounter: Payer: Self-pay | Admitting: Physical Therapy

## 2019-09-18 ENCOUNTER — Ambulatory Visit: Payer: 59 | Admitting: Physical Therapy

## 2019-09-18 ENCOUNTER — Other Ambulatory Visit: Payer: Self-pay | Admitting: Cardiovascular Disease

## 2019-09-18 DIAGNOSIS — M6281 Muscle weakness (generalized): Secondary | ICD-10-CM | POA: Diagnosis not present

## 2019-09-18 DIAGNOSIS — R262 Difficulty in walking, not elsewhere classified: Secondary | ICD-10-CM

## 2019-09-18 DIAGNOSIS — Z9889 Other specified postprocedural states: Secondary | ICD-10-CM | POA: Diagnosis not present

## 2019-09-18 DIAGNOSIS — Z96651 Presence of right artificial knee joint: Secondary | ICD-10-CM

## 2019-09-18 DIAGNOSIS — R600 Localized edema: Secondary | ICD-10-CM | POA: Diagnosis not present

## 2019-09-18 MED FILL — ROSUVASTATIN CALCIUM 10 MG: 10 | 90 days supply | Qty: 90 | Fill #2

## 2019-09-18 MED FILL — DOXYCYCLINE HYC 100 MG CAPS: 100 | 30 days supply | Qty: 60 | Fill #1

## 2019-09-18 NOTE — Therapy (Signed)
[place], [name], [address] Phone: [phone]   Fax:  [phone]  Physical Therapy Treatment  Patient Details  Name: [name]. MRN: [id number] Date of Birth: 05-13-1961 Referring Provider (PT): Dr. [name]   Encounter Date: 09/18/2019  PT End of Session - 09/18/19 0926    Visit Number  9    Number of Visits  24    Date for PT Re-Evaluation  10/23/19    Authorization Type  [facility]    PT Start Time  0915    PT Stop Time  1010    PT Time Calculation (min)  55 min    Activity Tolerance  Patient tolerated treatment well    Behavior During Therapy  [facility] for tasks assessed/performed       Past Medical History:  Diagnosis Date  . Arthritis   . Diabetes mellitus without complication ([place])    TYPE 2  . Diverticulosis   . Dyslipidemia   . GERD (gastroesophageal reflux disease)   . Herpes labialis   . Hypertension   . Longstanding persistent atrial fibrillation ([place])   . Metabolic syndrome   . Obesity   . Proteinuria   . Psoriasis   . Seborrheic dermatitis   . Sleep apnea    very compliant with CPAP, PT NEEDS TO BRING OWN MACHINE  . TIA (transient ischemic attack) 2009    Past Surgical History:  Procedure Laterality Date  . CARDIOVERSION N/A 12/02/2016   Procedure: CARDIOVERSION;  Surgeon: Pixie Casino, MD;  Location: [facility] ENDOSCOPY;  Service: Cardiovascular;  Laterality: N/A;  . COLONOSCOPY    . EXCISIONAL TOTAL KNEE ARTHROPLASTY WITH ANTIBIOTIC SPACERS Right 03/28/2019   Procedure: EXCISIONAL TOTAL KNEE ARTHROPLASTY WITH ANTIBIOTIC SPACERS;  Surgeon: [name], MD;  [facility];  Service: Orthopedics;  Laterality: Right;  90 mins  . I & D KNEE WITH POLY EXCHANGE Right 09/10/2018   Procedure: Right Knee Arthroplasty IRRIGATION AND DEBRIDEMENT KNEE WITH POLY EXCHANGE;  Surgeon: [name], MD;  Location: [facility];  Service: Orthopedics;  Laterality: Right;  . JOINT  REPLACEMENT  11/03/10   LT HIP  . PFO occluder cardiac valve  2006   Dr. Einar Gip, (hole in heart)  . REIMPLANTATION OF TOTAL KNEE Right 06/15/2019   Procedure: Resection of antibiotic spacer and  irrigation and debridment and placement of new antibiotic spacer and components;  Surgeon: [name], MD;  Location: [facility];  Service: Orthopedics;  Laterality: Right;  90 mins  . REIMPLANTATION OF TOTAL KNEE Right 08/22/2019   Procedure: REIMPLANTATION OF TOTAL KNEE;  Surgeon: [name], MD;  Location: [facility];  Service: Orthopedics;  Laterality: Right;  120 mins  . TONSILLECTOMY AND ADENOIDECTOMY    . TOTAL HIP ARTHROPLASTY Left   . TOTAL KNEE ARTHROPLASTY Right 05/31/2018   Procedure: RIGHT TOTAL KNEE ARTHROPLASTY;  Surgeon: [name], MD;  [facility]: [facility];  Service: Orthopedics;  Laterality: Right;  70 mins  . TOTAL KNEE ARTHROPLASTY Left 06/28/2018   Procedure: LEFT TOTAL KNEE ARTHROPLASTY;  Surgeon: [name], MD;  Location: [facility];  Service: Orthopedics;  Laterality: Left;  70 mins  . WISDOM TOOTH EXTRACTION      There were no vitals filed for this visit.  Subjective Assessment - 09/18/19 0923    Subjective  Patient reports he is doing well. He walked about 6k steps on Saturday, and felt a little stiff following. .    Currently in Pain?  Yes    Pain Score  2     Pain Location  Knee    Pain Orientation  Right    Pain Descriptors / Indicators  Sore;Tightness    Pain Type  Surgical pain    Pain Onset  1 to 4 weeks ago    Pain Frequency  Constant         [facility] PT Assessment - 09/18/19 0001      AROM   Right Knee Extension  5    Right Knee Flexion  112                   [facility] Adult PT Treatment/Exercise - 09/18/19 0001      Exercises   Exercises  Knee/Hip      Knee/Hip Exercises: Stretches   Active Hamstring Stretch  2 reps;30 seconds    Active Hamstring Stretch Limitations  seated edge of mat    Quad Stretch  3 reps;30 seconds    Quad Stretch  Limitations  prone    Gastroc Stretch  2 reps;30 seconds    Gastroc Stretch Limitations  standing      Knee/Hip Exercises: Aerobic   Nustep  6 min LE only L6      Knee/Hip Exercises: Standing   Heel Raises  2 sets;15 reps      Knee/Hip Exercises: Seated   Long Arc Quad  2 sets;10 reps    Long Arc Quad Weight  2 lbs.      Knee/Hip Exercises: Supine   Quad Sets  10 reps   5 sec hold   Straight Leg Raises  2 sets;10 reps      Knee/Hip Exercises: Prone   Hamstring Curl  10 reps      Modalities   Modalities  Vasopneumatic      Vasopneumatic   Number Minutes Vasopneumatic   15 minutes    Vasopnuematic Location   Knee    Vasopneumatic Pressure  High    Vasopneumatic Temperature   34      Manual Therapy   Manual Therapy  Joint mobilization;Passive ROM    Manual therapy comments  Focused on improving knee motion    Joint Mobilization  AP and rotational tibiofemoral joint mobs in supine and seated, PF joint mobs in all directions    Passive ROM  Knee flexion and extension stretching in seated and supine, supine hamstring stretch              PT Education - 09/18/19 0926    Education Details  HEP, continued stretchng, walking progression, compression with ACE wrap    Person(s) Educated  Patient    Methods  Explanation;Demonstration;Tactile cues;Verbal cues;Handout    Comprehension  Verbalized understanding;Returned demonstration;Verbal cues required;Tactile cues required;Need further instruction       PT Short Term Goals - 08/28/19 1929      PT SHORT TERM GOAL #1   Title  Pt will be I with initial HEP for Rt knee ROM and strength, balance    Time  4    Period  Weeks    Status  New    Target Date  09/25/19      PT SHORT TERM GOAL #2   Title  Pt will be able to walk with/without least restricted assistive device in clinic with min noticeable limp, improved heel strike.    Time  4    Period  Weeks    Status  New    Target Date  09/25/19  PT SHORT TERM GOAL  #3   Title  Pt will be able to sit with knee flexed to 95 deg for improved sit to stand symmetry and ease    Time  6    Period  Weeks    Status  New    Target Date  09/25/19      PT SHORT TERM GOAL #4   Title  Pt will be able to demo passive knee extension to no more than 10 deg. to work towards near full extension and normal knee function.    Time  4    Period  Weeks    Status  New    Target Date  09/25/19      PT SHORT TERM GOAL #5   Title  Pt will understand RICE in order to minimize pain and swelling in RT LE    Time  8    Period  Weeks    Status  New    Target Date  09/25/19        PT Long Term Goals - 08/28/19 1949      PT LONG TERM GOAL #1   Title  Pt will improve FOTO score to no more than 49 % limited to demo improved functional mobility.    Baseline  64%    Time  8    Period  Weeks    Status  New    Target Date  10/23/19      PT LONG TERM GOAL #2   Title  Pt will be able to perform light recreational activites in the home and ADLs  with no more than min occasional difficulty    Time  8    Period  Weeks    Status  New    Target Date  10/23/19      PT LONG TERM GOAL #3   Title  Pt will be able to go up and down 12 stairs in his home with min difficulty, reciprocal and using 1 rail.    Time  8    Period  Weeks    Status  New    Target Date  10/23/19      PT LONG TERM GOAL #4   Title  Pt will demo 5/5 knee strength as well as hip abduction for normal, safe gait mechanics    Time  8    Period  Weeks    Status  New    Target Date  10/23/19      PT LONG TERM GOAL #5   Title  Pt will be able to extend knee AROM to lacking no more than 10 deg.    Time  8    Period  Weeks    Status  New    Target Date  10/23/19      Additional Long Term Goals   Additional Long Term Goals  Yes      PT LONG TERM GOAL #6   Title  Pt will be I with final HEP upong discharge for long term mobility and health.    Time  8    Period  Weeks    Status  New    Target Date   10/23/19            Plan - 09/18/19 1133    Clinical Impression Statement  Patient continues to improve with his knee motion and is progressing well with strengthening and weight bearing tasks. He does continue to exhibit swelling of the right  knee that is most likely limiting his knee motion. He would benefit from continued skilled PT to improve his ROM , strength, walking, and balance to return to work.    PT Treatment/Interventions  ADLs/Self Care Home Management;Stair training;Functional mobility training;Cryotherapy;Neuromuscular re-education;Balance training;Therapeutic exercise;Therapeutic activities;Manual lymph drainage;Taping;Vasopneumatic Device;Manual techniques;Passive range of motion;Patient/family education;DME Instruction;Gait training;Electrical Stimulation    PT Next Visit Plan  Assess HEP and progress PRN, ROM and strength as tol, recumbant, edema mgmt    PT Home Exercise Plan  QS, towel slide flexion, SLR, stand wgt shift, mini squats and sit to stand no UE assist if able, calf stretch, seated or supine hamstring stretch    Consulted and Agree with Plan of Care  Patient       Patient will benefit from skilled therapeutic intervention in order to improve the following deficits and impairments:  Difficulty walking, Increased fascial restricitons, Decreased range of motion, Pain, Decreased skin integrity, Decreased balance, Decreased scar mobility, Hypomobility, Impaired flexibility, Decreased strength, Decreased mobility, Increased edema, Postural dysfunction  Visit Diagnosis: S/P right knee arthroscopy  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 S/P right TK rev 08/22/2019  . Status post revision of total knee, right 06/15/2019  . Acquired absence of knee joint following  explantation of joint prosthesis with presence of antibiotic-impregnated cement spacer, right 03/28/2019  . 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 High risk medication use 11/08/2018  . Infected right TKA 09/10/2018  . S/P left TKA 06/28/2018  . S/P TKR (total knee replacement), right 05/31/2018  . Osteoarthritis of right knee 05/09/2018  . Tubular adenoma of colon 10/18/2017  . 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 Diabetic nephropathy associated with type 2 diabetes mellitus ([place]) 10/13/2016  . Permanent atrial fibrillation ([place]) 10/13/2016  . Health examination of defined subpopulation 11/15/2015  . Polycythemia vera ([place]) 10/09/2014  . Hyperlipidemia associated with type 2 diabetes mellitus ([place]) 09/17/2014  . Hypertension associated with diabetes ([place]) 09/17/2014  . Diabetes mellitus ([place]) 10/31/2013  . Hx-TIA (transient ischemic attack) 09/05/2012  . GERD (gastroesophageal reflux disease) 08/17/2011  . Obesity (BMI 30-39.9) 08/17/2011  . Psoriatic arthritis ([place]) 08/17/2011  . Obstructive sleep apnea 08/05/2009    Hilda Blades, PT, DPT, LAT, ATC 09/18/19  11:44 AM Phone: [phone] Fax: [facility] [address] [place], [name], [address] Phone: [phone]   Fax:  [phone]  Name: [name]. MRN: [id number] Date of Birth: [birth year]

## 2019-09-20 ENCOUNTER — Other Ambulatory Visit: Payer: Self-pay | Admitting: Cardiovascular Disease

## 2019-09-20 MED FILL — ENTRESTO 24 MG-26 MG TABLET: 24-26 | 30 days supply | Qty: 60 | Fill #0

## 2019-09-20 MED FILL — JARDIANCE 10 MG TABLET: 10 | 30 days supply | Qty: 30 | Fill #5

## 2019-09-21 ENCOUNTER — Encounter: Payer: Self-pay | Admitting: Physical Therapy

## 2019-09-21 ENCOUNTER — Other Ambulatory Visit: Payer: Self-pay

## 2019-09-21 ENCOUNTER — Ambulatory Visit: Payer: 59 | Admitting: Physical Therapy

## 2019-09-21 DIAGNOSIS — Z9889 Other specified postprocedural states: Secondary | ICD-10-CM

## 2019-09-21 DIAGNOSIS — Z96651 Presence of right artificial knee joint: Secondary | ICD-10-CM | POA: Diagnosis not present

## 2019-09-21 DIAGNOSIS — R262 Difficulty in walking, not elsewhere classified: Secondary | ICD-10-CM | POA: Diagnosis not present

## 2019-09-21 DIAGNOSIS — R600 Localized edema: Secondary | ICD-10-CM

## 2019-09-21 DIAGNOSIS — M6281 Muscle weakness (generalized): Secondary | ICD-10-CM | POA: Diagnosis not present

## 2019-09-21 NOTE — Therapy (Signed)
[place], [name], [address] Phone: [phone]   Fax:  [phone]  Physical Therapy Treatment   Progress Note Reporting Period 08/28/2019 to 09/21/2019  See note below for Objective Data and Assessment of Progress/Goals.    Patient Details  Name: [name]. MRN: [id number] Date of Birth: [birth year] Referring Provider (PT): Dr. [name]   Encounter Date: 09/21/2019  PT End of Session - 09/21/19 0926    Visit Number  10    Number of Visits  24    Date for PT Re-Evaluation  10/23/19    Authorization Type  [facility] U[employer]    PT Start Time  0915    PT Stop Time  1015    PT Time Calculation (min)  60 min    Activity Tolerance  Patient tolerated treatment well    Behavior During Therapy  [facility] for tasks assessed/performed       Past Medical History:  Diagnosis Date  . Arthritis   . Diabetes mellitus without complication ([place])    TYPE 2  . Diverticulosis   . Dyslipidemia   . GERD (gastroesophageal reflux disease)   . Herpes labialis   . Hypertension   . Longstanding persistent atrial fibrillation ([place])   . Metabolic syndrome   . Obesity   . Proteinuria   . Psoriasis   . Seborrheic dermatitis   . Sleep apnea    very compliant with CPAP, PT NEEDS TO BRING OWN MACHINE  . TIA (transient ischemic attack) 2009    Past Surgical History:  Procedure Laterality Date  . CARDIOVERSION N/A 12/02/2016   Procedure: CARDIOVERSION;  Surgeon: Pixie Casino, MD;  Location: [facility] ENDOSCOPY;  Service: Cardiovascular;  Laterality: N/A;  . COLONOSCOPY    . EXCISIONAL TOTAL KNEE ARTHROPLASTY WITH ANTIBIOTIC SPACERS Right 03/28/2019   Procedure: EXCISIONAL TOTAL KNEE ARTHROPLASTY WITH ANTIBIOTIC SPACERS;  Surgeon: [name], MD;  Location: [facility] ORS;  Service: Orthopedics;  Laterality: Right;  90 mins  . I & D KNEE WITH POLY EXCHANGE Right 09/10/2018   Procedure: Right Knee Arthroplasty IRRIGATION AND DEBRIDEMENT  KNEE WITH POLY EXCHANGE;  Surgeon: [name], MD;  Location: [facility]S;  Service: Orthopedics;  Laterality: Right;  . JOINT REPLACEMENT  11/03/10   LT HIP  . PFO occluder cardiac valve  2006   Dr. Einar Gip, (hole in heart)  . REIMPLANTATION OF TOTAL KNEE Right 06/15/2019   Procedure: Resection of antibiotic spacer and  irrigation and debridment and placement of new antibiotic spacer and components;  Surgeon: [name], MD;  Location: [facility]S;  Service: Orthopedics;  Laterality: Right;  90 mins  . REIMPLANTATION OF TOTAL KNEE Right 08/22/2019   Procedure: REIMPLANTATION OF TOTAL KNEE;  Surgeon: [name], MD;  Location: [facility];  Service: Orthopedics;  Laterality: Right;  120 mins  . TONSILLECTOMY AND ADENOIDECTOMY    . TOTAL HIP ARTHROPLASTY Left   . TOTAL KNEE ARTHROPLASTY Right 05/31/2018   Procedure: RIGHT TOTAL KNEE ARTHROPLASTY;  Surgeon: [name], MD;  Location: [facility];  Service: Orthopedics;  Laterality: Right;  70 mins  . TOTAL KNEE ARTHROPLASTY Left 06/28/2018   Procedure: LEFT TOTAL KNEE ARTHROPLASTY;  Surgeon: [name], MD;  Location: [facility];  Service: Orthopedics;  Laterality: Left;  70 mins  . WISDOM TOOTH EXTRACTION      There were no vitals filed for this visit.  Subjective Assessment - 09/21/19 0922    Subjective  Patient reports he continues to  do well. He has been noticing he has been walking around less without the walking stick.    Patient Stated Goals  Pt would like to be able to return to yardwork, tennis    Currently in Pain?  Yes    Pain Score  2     Pain Location  Knee    Pain Orientation  Right    Pain Descriptors / Indicators  Sore;Tightness    Pain Type  Surgical pain    Pain Onset  More than a month ago    Pain Frequency  Constant         [facility] PT Assessment - 09/21/19 0001      Assessment   Medical Diagnosis  Rt TKA     Referring Provider (PT)  Dr. [name]      AROM   Right Knee Extension  5    Right Knee Flexion  112       Strength   Right Knee Flexion  4+/5   within available range   Right Knee Extension  4/5   within available range     Ambulation/Gait   Ambulation/Gait  Yes    Ambulation/Gait Assistance  6: Modified independent (Device/Increase time)    Assistive device  --   Walking stick in left   Gait Comments  Patient exhibits improved gait form this visit, improved heel strike and toe off, improved knee flexion with swing, continues to have mild coxalgic gait                   [facility] Adult PT Treatment/Exercise - 09/21/19 0001      Exercises   Exercises  Knee/Hip      Knee/Hip Exercises: Stretches   Active Hamstring Stretch  30 seconds    Active Hamstring Stretch Limitations  seated edge of mat    Gastroc Stretch  30 seconds    Gastroc Stretch Limitations  standing      Knee/Hip Exercises: Aerobic   Nustep  6 min LE only L6      Knee/Hip Exercises: Standing   Heel Raises  20 reps    Hip Abduction  20 reps    Hip Extension  20 reps      Knee/Hip Exercises: Seated   Long Arc Quad  10 reps    Sit to [employer]  20 reps;without UE support   2 sets     Knee/Hip Exercises: Supine   Quad Sets  5 reps   5 sec hold   Straight Leg Raises  10 reps      Modalities   Modalities  Vasopneumatic      Vasopneumatic   Number Minutes Vasopneumatic   15 minutes    Vasopnuematic Location   Knee    Vasopneumatic Pressure  High    Vasopneumatic Temperature   34      Manual Therapy   Manual Therapy  Joint mobilization;Passive ROM    Manual therapy comments  Focused on improving knee motion    Joint Mobilization  AP and rotational tibiofemoral joint mobs in supine and seated, PF joint mobs in all directions    Passive ROM  Knee flexion and extension stretching in seated and supine, supine hamstring stretch              PT Education - 09/21/19 0925    Education Details  HEP, continued stretchng, walking progression, compression with ACE wrap    Person(s) Educated  Patient     Methods  Explanation;Demonstration;Verbal cues;Handout    Comprehension  Verbalized understanding;Returned demonstration;Verbal cues required;Need further instruction       PT Short Term Goals - 09/21/19 1012      PT SHORT TERM GOAL #1   Title  Pt will be I with initial HEP for Rt knee ROM and strength, balance    Time  4    Period  Weeks    Status  On-going    Target Date  09/25/19      PT SHORT TERM GOAL #2   Title  Pt will be able to walk with/without least restricted assistive device in clinic with min noticeable limp, improved heel strike.    Time  4    Period  Weeks    Status  Achieved    Target Date  09/25/19      PT SHORT TERM GOAL #3   Title  Pt will be able to sit with knee flexed to 95 deg for improved sit to stand symmetry and ease    Time  6    Period  Weeks    Status  Achieved    Target Date  09/25/19      PT SHORT TERM GOAL #4   Title  Pt will be able to demo passive knee extension to no more than 10 deg. to work towards near full extension and normal knee function.    Time  4    Period  Weeks    Status  Achieved    Target Date  09/25/19      PT SHORT TERM GOAL #5   Title  Pt will understand RICE in order to minimize pain and swelling in RT LE    Time  8    Period  Weeks    Status  Achieved    Target Date  09/25/19        PT Long Term Goals - 08/28/19 1949      PT LONG TERM GOAL #1   Title  Pt will improve FOTO score to no more than 49 % limited to demo improved functional mobility.    Baseline  64%    Time  8    Period  Weeks    Status  New    Target Date  10/23/19      PT LONG TERM GOAL #2   Title  Pt will be able to perform light recreational activites in the home and ADLs  with no more than min occasional difficulty    Time  8    Period  Weeks    Status  New    Target Date  10/23/19      PT LONG TERM GOAL #3   Title  Pt will be able to go up and down 12 stairs in his home with min difficulty, reciprocal and using 1 rail.    Time  8     Period  Weeks    Status  New    Target Date  10/23/19      PT LONG TERM GOAL #4   Title  Pt will demo 5/5 knee strength as well as hip abduction for normal, safe gait mechanics    Time  8    Period  Weeks    Status  New    Target Date  10/23/19      PT LONG TERM GOAL #5   Title  Pt will be able to extend knee AROM to lacking no more than 10 deg.  Time  8    Period  Weeks    Status  New    Target Date  10/23/19      Additional Long Term Goals   Additional Long Term Goals  Yes      PT LONG TERM GOAL #6   Title  Pt will be I with final HEP upong discharge for long term mobility and health.    Time  8    Period  Weeks    Status  New    Target Date  10/23/19            Plan - 09/21/19 [id number]    Clinical Impression Statement  Patient is progressing well and tolerated the addition of strengthening well this visit. He does continue to be limited in his ROM and exhibits swelling of the knee. He is walking better with the walking stick.  He would benefit from continued skilled PT to improve his ROM , strength, walking, and balance to return to work.    PT Treatment/Interventions  ADLs/Self Care Home Management;Stair training;Functional mobility training;Cryotherapy;Neuromuscular re-education;Balance training;Therapeutic exercise;Therapeutic activities;Manual lymph drainage;Taping;Vasopneumatic Device;Manual techniques;Passive range of motion;Patient/family education;DME Instruction;Gait training;Electrical Stimulation    PT Next Visit Plan  Assess HEP and progress PRN, ROM and strength as tol, recumbant, edema mgmt    PT Home Exercise Plan  QS, towel slide flexion, SLR, sit to stand no UE, calf stretch, heel raises, seated or supine hamstring stretch, standing hip abduction and extension    Consulted and Agree with Plan of Care  Patient       Patient will benefit from skilled therapeutic intervention in order to improve the following deficits and impairments:  Difficulty walking,  Increased fascial restricitons, Decreased range of motion, Pain, Decreased skin integrity, Decreased balance, Decreased scar mobility, Hypomobility, Impaired flexibility, Decreased strength, Decreased mobility, Increased edema, Postural dysfunction  Visit Diagnosis: S/P right knee arthroscopy  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 S/P right TK rev 08/22/2019  . Status post revision of total knee, right 06/15/2019  . 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 High risk medication use 11/08/2018  . Infected right TKA 09/10/2018  . S/P left TKA 06/28/2018  . S/P TKR (total knee replacement), right 05/31/2018  . Osteoarthritis of right knee 05/09/2018  . Tubular adenoma of colon 10/18/2017  . 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 Diabetic nephropathy associated with type 2 diabetes mellitus ([place]) 10/13/2016  . Permanent atrial fibrillation ([place]) 10/13/2016  . Health examination of defined subpopulation 11/15/2015  . Polycythemia vera ([place]) 10/09/2014  . Hyperlipidemia associated with type 2 diabetes mellitus ([place]) 09/17/2014  . Hypertension associated with diabetes ([place]) 09/17/2014  . Diabetes mellitus ([place]) 10/31/2013  . Hx-TIA (transient ischemic attack) 09/05/2012  . GERD (gastroesophageal reflux disease) 08/17/2011  . Obesity (BMI 30-39.9) 08/17/2011  . Psoriatic arthritis ([place]) 08/17/2011  . Obstructive sleep apnea 08/05/2009    Hilda Blades, PT, DPT, LAT, ATC 09/21/19  10:21 AM Phone: [phone] Fax: [facility] [address] [place], [name], [address] Phone: [phone]   Fax:  [phone]  Name: [name]. MRN: [id number] Date of Birth: Dec 12, 1960

## 2019-09-22 ENCOUNTER — Encounter: Payer: Self-pay | Admitting: Physical Therapy

## 2019-09-22 ENCOUNTER — Ambulatory Visit: Payer: 59 | Admitting: Physical Therapy

## 2019-09-22 ENCOUNTER — Other Ambulatory Visit: Payer: Self-pay

## 2019-09-22 DIAGNOSIS — Z9889 Other specified postprocedural states: Secondary | ICD-10-CM

## 2019-09-22 DIAGNOSIS — R600 Localized edema: Secondary | ICD-10-CM

## 2019-09-22 DIAGNOSIS — Z96651 Presence of right artificial knee joint: Secondary | ICD-10-CM | POA: Diagnosis not present

## 2019-09-22 DIAGNOSIS — R262 Difficulty in walking, not elsewhere classified: Secondary | ICD-10-CM | POA: Diagnosis not present

## 2019-09-22 DIAGNOSIS — M6281 Muscle weakness (generalized): Secondary | ICD-10-CM | POA: Diagnosis not present

## 2019-09-22 NOTE — Therapy (Signed)
[place], [name], [address] Phone: [phone]   Fax:  [phone]  Physical Therapy Treatment  Patient Details  Name: [name]. MRN: [id number] Date of Birth: 03/07/1961 Referring Provider (PT): Dr. [name]   Encounter Date: 09/22/2019  PT End of Session - 09/22/19 1053    Visit Number  11    Number of Visits  24    Date for PT Re-Evaluation  10/23/19    Authorization Type  [facility] U[facility]    PT Start Time  1046    PT Stop Time  1135    PT Time Calculation (min)  49 min    Activity Tolerance  Patient tolerated treatment well    Behavior During Therapy  [facility] for tasks assessed/performed       Past Medical History:  Diagnosis Date  . Arthritis   . Diabetes mellitus without complication ([place])    TYPE 2  . Diverticulosis   . Dyslipidemia   . GERD (gastroesophageal reflux disease)   . Herpes labialis   . Hypertension   . Longstanding persistent atrial fibrillation ([place])   . Metabolic syndrome   . Obesity   . Proteinuria   . Psoriasis   . Seborrheic dermatitis   . Sleep apnea    very compliant with CPAP, PT NEEDS TO BRING OWN MACHINE  . TIA (transient ischemic attack) 2009    Past Surgical History:  Procedure Laterality Date  . CARDIOVERSION N/A 12/02/2016   Procedure: CARDIOVERSION;  Surgeon: Pixie Casino, MD;  Location: [facility] ENDOSCOPY;  Service: Cardiovascular;  Laterality: N/A;  . COLONOSCOPY    . EXCISIONAL TOTAL KNEE ARTHROPLASTY WITH ANTIBIOTIC SPACERS Right 03/28/2019   Procedure: EXCISIONAL TOTAL KNEE ARTHROPLASTY WITH ANTIBIOTIC SPACERS;  Surgeon: [name], MD;  Location: [facility];  Service: Orthopedics;  Laterality: Right;  90 mins  . I & D KNEE WITH POLY EXCHANGE Right 09/10/2018   Procedure: Right Knee Arthroplasty IRRIGATION AND DEBRIDEMENT KNEE WITH POLY EXCHANGE;  Surgeon: [name], MD;  Location: [facility];  Service: Orthopedics;  Laterality: Right;  . JOINT  REPLACEMENT  11/03/10   LT HIP  . PFO occluder cardiac valve  2006   Dr. Einar Gip, (hole in heart)  . REIMPLANTATION OF TOTAL KNEE Right 06/15/2019   Procedure: Resection of antibiotic spacer and  irrigation and debridment and placement of new antibiotic spacer and components;  Surgeon: [name], MD;  Location: [facility];  Service: Orthopedics;  Laterality: Right;  90 mins  . REIMPLANTATION OF TOTAL KNEE Right 08/22/2019   Procedure: REIMPLANTATION OF TOTAL KNEE;  Surgeon: [name], MD;  Location: [facility];  Service: Orthopedics;  Laterality: Right;  120 mins  . TONSILLECTOMY AND ADENOIDECTOMY    . TOTAL HIP ARTHROPLASTY Left   . TOTAL KNEE ARTHROPLASTY Right 05/31/2018   Procedure: RIGHT TOTAL KNEE ARTHROPLASTY;  Surgeon: [name], MD;  Location: [facility];  Service: Orthopedics;  Laterality: Right;  70 mins  . TOTAL KNEE ARTHROPLASTY Left 06/28/2018   Procedure: LEFT TOTAL KNEE ARTHROPLASTY;  Surgeon: [name], MD;  Location: [facility];  Service: Orthopedics;  Laterality: Left;  70 mins  . WISDOM TOOTH EXTRACTION      There were no vitals filed for this visit.  Subjective Assessment - 09/22/19 1050    Subjective  Patient reports increased soreness this visit, especially the hips and right upper thigh.    Patient Stated Goals  Pt would like to be able to  return to yardwork, tennis    Currently in Pain?  Yes    Pain Score  4     Pain Location  Knee    Pain Orientation  Right    Pain Descriptors / Indicators  Sore    Pain Type  Surgical pain    Pain Onset  More than a month ago    Pain Frequency  Constant                       [facility] Adult PT Treatment/Exercise - 09/22/19 0001      Exercises   Exercises  Knee/Hip      Knee/Hip Exercises: Stretches   Passive Hamstring Stretch  2 reps;30 seconds    Passive Hamstring Stretch Limitations  supine    Quad Stretch  3 reps;30 seconds    Quad Stretch Limitations  supine      Knee/Hip Exercises: Aerobic   Recumbent  Bike  5 min for knee flexion ROM    Nustep  6 min LE only L6      Modalities   Modalities  Vasopneumatic      Vasopneumatic   Number Minutes Vasopneumatic   15 minutes    Vasopnuematic Location   Knee    Vasopneumatic Pressure  High    Vasopneumatic Temperature   34      Manual Therapy   Manual Therapy  Joint mobilization;Passive ROM    Manual therapy comments  Focused on improving knee motion    Joint Mobilization  AP and rotational tibiofemoral joint mobs in supine and seated, PF joint mobs in all directions    Passive ROM  Knee flexion and extension stretching in seated and supine, supine hamstring stretch              PT Education - 09/22/19 1052    Education Details  HEP, compression, elevation, icing, gentle stretching    Person(s) Educated  Patient    Methods  Explanation;Demonstration;Verbal cues    Comprehension  Verbalized understanding;Returned demonstration;Verbal cues required;Need further instruction       PT Short Term Goals - 09/21/19 1012      PT SHORT TERM GOAL #1   Title  Pt will be I with initial HEP for Rt knee ROM and strength, balance    Time  4    Period  Weeks    Status  On-going    Target Date  09/25/19      PT SHORT TERM GOAL #2   Title  Pt will be able to walk with/without least restricted assistive device in clinic with min noticeable limp, improved heel strike.    Time  4    Period  Weeks    Status  Achieved    Target Date  09/25/19      PT SHORT TERM GOAL #3   Title  Pt will be able to sit with knee flexed to 95 deg for improved sit to stand symmetry and ease    Time  6    Period  Weeks    Status  Achieved    Target Date  09/25/19      PT SHORT TERM GOAL #4   Title  Pt will be able to demo passive knee extension to no more than 10 deg. to work towards near full extension and normal knee function.    Time  4    Period  Weeks    Status  Achieved    Target Date  09/25/19      PT SHORT TERM GOAL #5   Title  Pt will understand  RICE in order to minimize pain and swelling in RT LE    Time  8    Period  Weeks    Status  Achieved    Target Date  09/25/19        PT Long Term Goals - 08/28/19 1949      PT LONG TERM GOAL #1   Title  Pt will improve FOTO score to no more than 49 % limited to demo improved functional mobility.    Baseline  64%    Time  8    Period  Weeks    Status  New    Target Date  10/23/19      PT LONG TERM GOAL #2   Title  Pt will be able to perform light recreational activites in the home and ADLs  with no more than min occasional difficulty    Time  8    Period  Weeks    Status  New    Target Date  10/23/19      PT LONG TERM GOAL #3   Title  Pt will be able to go up and down 12 stairs in his home with min difficulty, reciprocal and using 1 rail.    Time  8    Period  Weeks    Status  New    Target Date  10/23/19      PT LONG TERM GOAL #4   Title  Pt will demo 5/5 knee strength as well as hip abduction for normal, safe gait mechanics    Time  8    Period  Weeks    Status  New    Target Date  10/23/19      PT LONG TERM GOAL #5   Title  Pt will be able to extend knee AROM to lacking no more than 10 deg.    Time  8    Period  Weeks    Status  New    Target Date  10/23/19      Additional Long Term Goals   Additional Long Term Goals  Yes      PT LONG TERM GOAL #6   Title  Pt will be I with final HEP upong discharge for long term mobility and health.    Time  8    Period  Weeks    Status  New    Target Date  10/23/19            Plan - 09/22/19 1053    Clinical Impression Statement  Patient exhibited increased soreness this visit from therapy yesterday so today focused on gentle stretching and mobilization of the knee to improve motion and reduce some soreness. He continues to do well but seems to be limited by persistent swelling. He continues to report use of [place] is beneficial for swelling and soreness. He would benefit from continued skilled PT to porgress  range of motion and strength to return to previous level of function.    PT Treatment/Interventions  ADLs/Self Care Home Management;Stair training;Functional mobility training;Cryotherapy;Neuromuscular re-education;Balance training;Therapeutic exercise;Therapeutic activities;Manual lymph drainage;Taping;Vasopneumatic Device;Manual techniques;Passive range of motion;Patient/family education;DME Instruction;Gait training;Electrical Stimulation    PT Next Visit Plan  Assess HEP and progress PRN, ROM and strength as tol, recumbant, edema mgmt    PT Home Exercise Plan  QS, towel slide flexion, SLR, sit to stand no UE, calf stretch, heel  raises, seated or supine hamstring stretch, standing hip abduction and extension    Consulted and Agree with Plan of Care  Patient       Patient will benefit from skilled therapeutic intervention in order to improve the following deficits and impairments:  Difficulty walking, Increased fascial restricitons, Decreased range of motion, Pain, Decreased skin integrity, Decreased balance, Decreased scar mobility, Hypomobility, Impaired flexibility, Decreased strength, Decreased mobility, Increased edema, Postural dysfunction  Visit Diagnosis: S/P right knee arthroscopy  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 S/P right TK rev 08/22/2019  . Status post revision of total knee, right 06/15/2019  . 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 High risk medication use 11/08/2018  . Infected right TKA 09/10/2018  . S/P left TKA 06/28/2018  .  S/P TKR (total knee replacement), right 05/31/2018  . Osteoarthritis of right knee 05/09/2018  . Tubular adenoma of colon 10/18/2017  . 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 Diabetic nephropathy associated with type 2 diabetes mellitus ([place]) 10/13/2016  . Permanent atrial fibrillation ([place]) 10/13/2016  . Health examination of defined subpopulation 11/15/2015  . Polycythemia vera ([place]) 10/09/2014  . Hyperlipidemia associated with type 2 diabetes mellitus ([place]) 09/17/2014  . Hypertension associated with diabetes ([place]) 09/17/2014  . Diabetes mellitus ([place]) 10/31/2013  . Hx-TIA (transient ischemic attack) 09/05/2012  . GERD (gastroesophageal reflux disease) 08/17/2011  . Obesity (BMI 30-39.9) 08/17/2011  . Psoriatic arthritis ([place]) 08/17/2011  . Obstructive sleep apnea 08/05/2009    Hilda Blades, PT, DPT, LAT, ATC 09/22/19  11:26 AM Phone: [phone] Fax: [facility] [address] [place], [name], [address] Phone: [phone]   Fax:  [phone]  Name: [name]. MRN: [id number] Date of Birth: [birth year]

## 2019-09-25 ENCOUNTER — Other Ambulatory Visit: Payer: Self-pay

## 2019-09-25 ENCOUNTER — Ambulatory Visit: Payer: 59 | Admitting: Physical Therapy

## 2019-09-25 ENCOUNTER — Encounter: Payer: Self-pay | Admitting: Physical Therapy

## 2019-09-25 DIAGNOSIS — M6281 Muscle weakness (generalized): Secondary | ICD-10-CM | POA: Diagnosis not present

## 2019-09-25 DIAGNOSIS — R600 Localized edema: Secondary | ICD-10-CM

## 2019-09-25 DIAGNOSIS — Z96651 Presence of right artificial knee joint: Secondary | ICD-10-CM | POA: Diagnosis not present

## 2019-09-25 DIAGNOSIS — Z9889 Other specified postprocedural states: Secondary | ICD-10-CM | POA: Diagnosis not present

## 2019-09-25 DIAGNOSIS — R262 Difficulty in walking, not elsewhere classified: Secondary | ICD-10-CM | POA: Diagnosis not present

## 2019-09-25 NOTE — Therapy (Signed)
[place], [name], [address] Phone: [phone]   Fax:  [phone]  Physical Therapy Treatment  Patient Details  Name: [name]. MRN: [id number] Date of Birth: [birth year] Referring Provider (PT): Dr. [name]   Encounter Date: 09/25/2019  PT End of Session - 09/25/19 1007    Visit Number  12    Number of Visits  24    Date for PT Re-Evaluation  10/23/19    Authorization Type  [facility] U[facility]    PT Start Time  1001    PT Stop Time  1100    PT Time Calculation (min)  59 min    Activity Tolerance  Patient tolerated treatment well    Behavior During Therapy  [facility] for tasks assessed/performed       Past Medical History:  Diagnosis Date  . Arthritis   . Diabetes mellitus without complication ([place])    TYPE 2  . Diverticulosis   . Dyslipidemia   . GERD (gastroesophageal reflux disease)   . Herpes labialis   . Hypertension   . Longstanding persistent atrial fibrillation ([place])   . Metabolic syndrome   . Obesity   . Proteinuria   . Psoriasis   . Seborrheic dermatitis   . Sleep apnea    very compliant with CPAP, PT NEEDS TO BRING OWN MACHINE  . TIA (transient ischemic attack) 2009    Past Surgical History:  Procedure Laterality Date  . CARDIOVERSION N/A 12/02/2016   Procedure: CARDIOVERSION;  Surgeon: Pixie Casino, MD;  Location: [facility] ENDOSCOPY;  Service: Cardiovascular;  Laterality: N/A;  . COLONOSCOPY    . EXCISIONAL TOTAL KNEE ARTHROPLASTY WITH ANTIBIOTIC SPACERS Right 03/28/2019   Procedure: EXCISIONAL TOTAL KNEE ARTHROPLASTY WITH ANTIBIOTIC SPACERS;  Surgeon: [name], MD;  Location: [facility];  Service: Orthopedics;  Laterality: Right;  90 mins  . I & D KNEE WITH POLY EXCHANGE Right 09/10/2018   Procedure: Right Knee Arthroplasty IRRIGATION AND DEBRIDEMENT KNEE WITH POLY EXCHANGE;  Surgeon: [name], MD;  Location: [facility];  Service: Orthopedics;  Laterality: Right;  . JOINT  REPLACEMENT  11/03/10   LT HIP  . PFO occluder cardiac valve  2006   Dr. Einar Gip, (hole in heart)  . REIMPLANTATION OF TOTAL KNEE Right 06/15/2019   Procedure: Resection of antibiotic spacer and  irrigation and debridment and placement of new antibiotic spacer and components;  Surgeon: [name], MD;  [facility];  Service: Orthopedics;  Laterality: Right;  90 mins  . REIMPLANTATION OF TOTAL KNEE Right 08/22/2019   Procedure: REIMPLANTATION OF TOTAL KNEE;  Surgeon: [name], MD;  [facility];  Service: Orthopedics;  Laterality: Right;  120 mins  . TONSILLECTOMY AND ADENOIDECTOMY    . TOTAL HIP ARTHROPLASTY Left   . TOTAL KNEE ARTHROPLASTY Right 05/31/2018   Procedure: RIGHT TOTAL KNEE ARTHROPLASTY;  Surgeon: [name], MD;  [facility];  Service: Orthopedics;  Laterality: Right;  70 mins  . TOTAL KNEE ARTHROPLASTY Left 06/28/2018   Procedure: LEFT TOTAL KNEE ARTHROPLASTY;  Surgeon: [name], MD;  [facility];  Service: Orthopedics;  Laterality: Left;  70 mins  . WISDOM TOOTH EXTRACTION      There were no vitals filed for this visit.  Subjective Assessment - 09/25/19 1004    Subjective  Patient reports he was in more pain on Friday night after our last session, then he felt good on Saturday morning but over-did it on Saturday and  was sore the rest of the week.    Patient Stated Goals  Pt would like to be able to return to yardwork, tennis    Currently in Pain?  Yes    Pain Score  3     Pain Location  Knee    Pain Orientation  Right    Pain Descriptors / Indicators  Sore    Pain Type  Surgical pain    Pain Onset  More than a month ago    Pain Frequency  Constant         [facility] PT Assessment - 09/25/19 0001      AROM   Right Knee Extension  5    Right Knee Flexion  112                   [facility] Adult PT Treatment/Exercise - 09/25/19 0001      Knee/Hip Exercises: Stretches   Active Hamstring Stretch  3 reps;30 seconds    Active  Hamstring Stretch Limitations  seated edge of mat    Quad Stretch  3 reps;30 seconds    Quad Stretch Limitations  supine, passive      Knee/Hip Exercises: Aerobic   Nustep  6 min LE only L6   600 steps     Knee/Hip Exercises: Standing   Heel Raises  20 reps    Terminal Knee Extension  2 sets;10 reps    Theraband Level (Terminal Knee Extension)  Level 1 (Yellow)    Hip Abduction  15 reps    Hip Extension  15 reps      Knee/Hip Exercises: Seated   Long Arc Quad  2 sets;20 reps    Long Arc Quad Weight  2 lbs.      Modalities   Modalities  Vasopneumatic      Vasopneumatic   Number Minutes Vasopneumatic   15 minutes    Vasopnuematic Location   Knee    Vasopneumatic Pressure  High    Vasopneumatic Temperature   34      Manual Therapy   Manual Therapy  Joint mobilization;Passive ROM    Manual therapy comments  Focused on improving knee motion    Joint Mobilization  AP and rotational tibiofemoral joint mobs in supine and seated, PF joint mobs in all directions    Passive ROM  Knee flexion and extension stretching in seated and supine, supine hamstring stretch              PT Education - 09/25/19 1005    Education Details  HEP, stretching    Person(s) Educated  Patient    Methods  Explanation;Demonstration;Verbal cues    Comprehension  Verbalized understanding;Returned demonstration;Verbal cues required;Need further instruction       PT Short Term Goals - 09/21/19 1012      PT SHORT TERM GOAL #1   Title  Pt will be I with initial HEP for Rt knee ROM and strength, balance    Time  4    Period  Weeks    Status  On-going    Target Date  09/25/19      PT SHORT TERM GOAL #2   Title  Pt will be able to walk with/without least restricted assistive device in clinic with min noticeable limp, improved heel strike.    Time  4    Period  Weeks    Status  Achieved    Target Date  09/25/19      PT SHORT TERM GOAL #  3   Title  Pt will be able to sit with knee flexed to 95  deg for improved sit to stand symmetry and ease    Time  6    Period  Weeks    Status  Achieved    Target Date  09/25/19      PT SHORT TERM GOAL #4   Title  Pt will be able to demo passive knee extension to no more than 10 deg. to work towards near full extension and normal knee function.    Time  4    Period  Weeks    Status  Achieved    Target Date  09/25/19      PT SHORT TERM GOAL #5   Title  Pt will understand RICE in order to minimize pain and swelling in RT LE    Time  8    Period  Weeks    Status  Achieved    Target Date  09/25/19        PT Long Term Goals - 08/28/19 1949      PT LONG TERM GOAL #1   Title  Pt will improve FOTO score to no more than 49 % limited to demo improved functional mobility.    Baseline  64%    Time  8    Period  Weeks    Status  New    Target Date  10/23/19      PT LONG TERM GOAL #2   Title  Pt will be able to perform light recreational activites in the home and ADLs  with no more than min occasional difficulty    Time  8    Period  Weeks    Status  New    Target Date  10/23/19      PT LONG TERM GOAL #3   Title  Pt will be able to go up and down 12 stairs in his home with min difficulty, reciprocal and using 1 rail.    Time  8    Period  Weeks    Status  New    Target Date  10/23/19      PT LONG TERM GOAL #4   Title  Pt will demo 5/5 knee strength as well as hip abduction for normal, safe gait mechanics    Time  8    Period  Weeks    Status  New    Target Date  10/23/19      PT LONG TERM GOAL #5   Title  Pt will be able to extend knee AROM to lacking no more than 10 deg.    Time  8    Period  Weeks    Status  New    Target Date  10/23/19      Additional Long Term Goals   Additional Long Term Goals  Yes      PT LONG TERM GOAL #6   Title  Pt will be I with final HEP upong discharge for long term mobility and health.    Time  8    Period  Weeks    Status  New    Target Date  10/23/19            Plan - 09/25/19  1007    Clinical Impression Statement  Patient is continuing to do well, his motion has plateaued over the past few visits and he was encouraged to continue stretching at home. He does exhibit swelling of the knee  that could be limiting motion and with previous history of multiple surgeries his progress may be slower than expected. He would benefit from continued skilled PT to progress motion and strength so he can return to walking and activity without limitation.    PT Treatment/Interventions  ADLs/Self Care Home Management;Stair training;Functional mobility training;Cryotherapy;Neuromuscular re-education;Balance training;Therapeutic exercise;Therapeutic activities;Manual lymph drainage;Taping;Vasopneumatic Device;Manual techniques;Passive range of motion;Patient/family education;DME Instruction;Gait training;Electrical Stimulation    PT Next Visit Plan  Assess HEP and progress PRN, ROM and strength as tol, recumbant, edema mgmt    PT Home Exercise Plan  QS, towel slide flexion, SLR, sit to stand no UE, calf stretch, heel raises, seated or supine hamstring stretch, standing hip abduction and extension    Consulted and Agree with Plan of Care  Patient       Patient will benefit from skilled therapeutic intervention in order to improve the following deficits and impairments:  Difficulty walking, Increased fascial restricitons, Decreased range of motion, Pain, Decreased skin integrity, Decreased balance, Decreased scar mobility, Hypomobility, Impaired flexibility, Decreased strength, Decreased mobility, Increased edema, Postural dysfunction  Visit Diagnosis: S/P right knee arthroscopy  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 S/P right TK rev 08/22/2019  . Status post revision of total knee, right  06/15/2019  . 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 High risk medication use 11/08/2018  . Infected right TKA 09/10/2018  . S/P left TKA 06/28/2018  . S/P TKR (total knee replacement), right 05/31/2018  . Osteoarthritis of right knee 05/09/2018  . Tubular adenoma of colon 10/18/2017  . 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 Diabetic nephropathy associated with type 2 diabetes mellitus ([place]) 10/13/2016  . Permanent atrial fibrillation ([place]) 10/13/2016  . Health examination of defined subpopulation 11/15/2015  . Polycythemia vera ([place]) 10/09/2014  . Hyperlipidemia associated with type 2 diabetes mellitus ([place]) 09/17/2014  . Hypertension associated with diabetes ([place]) 09/17/2014  . Diabetes mellitus ([place]) 10/31/2013  . Hx-TIA (transient ischemic attack) 09/05/2012  . GERD (gastroesophageal reflux disease) 08/17/2011  . Obesity (BMI 30-39.9) 08/17/2011  . Psoriatic arthritis ([place]) 08/17/2011  . Obstructive sleep apnea 08/05/2009    Hilda Blades, PT, DPT, LAT, ATC 09/25/19  11:00 AM Phone: [phone] Fax: [facility] [address] [place], [name], [address] Phone: [phone]   Fax:  [phone]  Name: [name]. MRN: [id number] Date of Birth: [birth year]

## 2019-09-27 ENCOUNTER — Encounter: Payer: Self-pay | Admitting: Cardiovascular Disease

## 2019-09-27 ENCOUNTER — Other Ambulatory Visit: Payer: Self-pay

## 2019-09-27 ENCOUNTER — Ambulatory Visit: Payer: 59 | Admitting: Cardiovascular Disease

## 2019-09-27 VITALS — BP 120/90 | HR 67 | Ht 69.0 in | Wt 275.0 lb

## 2019-09-27 DIAGNOSIS — I5022 Chronic systolic (congestive) heart failure: Secondary | ICD-10-CM | POA: Insufficient documentation

## 2019-09-27 DIAGNOSIS — I4821 Permanent atrial fibrillation: Secondary | ICD-10-CM | POA: Diagnosis not present

## 2019-09-27 HISTORY — DX: Chronic systolic (congestive) heart failure: I50.22

## 2019-09-27 NOTE — Progress Notes (Signed)
Cardiology Office Note:    Date:  09/27/2019   ID:  [name], DOB [birth year], MRN [id number]  PCP:  Denita Lung, MD  Cardiologist:  Acie Fredrickson   / [place]  Electrophysiologist:  None   Referring MD: Denita Lung, MD   1.   PFO closure 2.  Atrial fib 3.   Chronic systolic CHF   Chief Complaint  Patient presents with  . Atrial Fibrillation     Jan.  6, 2020    [name]. is a 59 y.o. male with a hx of persistent atrial fibrillation, dyslipidemia, obstructive sleep apnea He has been seen in the past by Dr. [name].  He is seen currently by Dr. Thompson Grayer and was furred to me for general cardiology issues.  Hx of HTN, PFO - s/p occlusion device.   Hx of CHF  Hx of OSA    Seen with his wife , [name]  Has seen Dr. Einar Gip in the past  Has a PFO occlusion device following a TIA   Has had bilateral knee replacements ,  Both in Oct. 2019.   Dr. Rayann Heman increased his Coreg to 25  BID ,  Has some fatigue related to this increase  Tolerated the lower dose better .   Is on crutches today ,  Typically gets 7000-8000 steps a day   No CP, no dyspnea,  No sweats, no  PND.  No fever, rash, Sleeps well with CPAP.    Works as a Clinical biochemist   Oncologist and [place] )  His son played [place] with my son   Feb. 24, 2020:  [name] is seen today for follow-up of his atrial fibrillation, dyslipidemia struct of sleep apnea.  Has had bilateral knee replacement  Has has an infected right  knee joint ( MSSA ) .   Had a right knee poly exchange  Had 6 weeks of Ancef.     Is on Rifampin for 6 months  Is on Lovenox for now .   Does not want to do coumadin  Doesn't think he wants to go through rehab for his last knee surgery  Has been started on Jardiance. Has lost 50 lbs since Oct.   September 27, 2019: [name] is seen back today for his follow-up of his atrial fibrillation, dyslipidemia, and his chronic systolic congestive heart failure.  Wt is 275 lbs .   Has  continued to have issues with his infected right knee prosthesis  Has had 5 operations on his right knee.   Had his 3rd R TKA . He is feeling better.   Is back on xarelto   Still doing general contracting     Past Medical History:  Diagnosis Date  . Arthritis   . Diabetes mellitus without complication ([place])    TYPE 2  . Diverticulosis   . Dyslipidemia   . GERD (gastroesophageal reflux disease)   . Herpes labialis   . Hypertension   . Longstanding persistent atrial fibrillation ([place])   . Metabolic syndrome   . Obesity   . Proteinuria   . Psoriasis   . Seborrheic dermatitis   . Sleep apnea    very compliant with CPAP, PT NEEDS TO BRING OWN MACHINE  . TIA (transient ischemic attack) 2009    Past Surgical History:  Procedure Laterality Date  . CARDIOVERSION N/A 12/02/2016   Procedure: CARDIOVERSION;  Surgeon: Pixie Casino, MD;  Location: [facility];  Service: Cardiovascular;  Laterality: N/A;  .  COLONOSCOPY    . EXCISIONAL TOTAL KNEE ARTHROPLASTY WITH ANTIBIOTIC SPACERS Right 03/28/2019   Procedure: EXCISIONAL TOTAL KNEE ARTHROPLASTY WITH ANTIBIOTIC SPACERS;  Surgeon: [name], MD;  [facility];  Service: Orthopedics;  Laterality: Right;  90 mins  . I & D KNEE WITH POLY EXCHANGE Right 09/10/2018   Procedure: Right Knee Arthroplasty IRRIGATION AND DEBRIDEMENT KNEE WITH POLY EXCHANGE;  Surgeon: [name], MD;  [facility];  Service: Orthopedics;  Laterality: Right;  . JOINT REPLACEMENT  11/03/10   LT HIP  . PFO occluder cardiac valve  2006   Dr. Einar Gip, (hole in heart)  . REIMPLANTATION OF TOTAL KNEE Right 06/15/2019   Procedure: Resection of antibiotic spacer and  irrigation and debridment and placement of new antibiotic spacer and components;  Surgeon: [name], MD;  [facility];  Service: Orthopedics;  Laterality: Right;  90 mins  . REIMPLANTATION OF TOTAL KNEE Right 08/22/2019   Procedure: REIMPLANTATION OF TOTAL KNEE;  Surgeon: [name],  MD;  [facility];  Service: Orthopedics;  Laterality: Right;  120 mins  . TONSILLECTOMY AND ADENOIDECTOMY    . TOTAL HIP ARTHROPLASTY Left   . TOTAL KNEE ARTHROPLASTY Right 05/31/2018   Procedure: RIGHT TOTAL KNEE ARTHROPLASTY;  Surgeon: [name], MD;  [facility];  Service: Orthopedics;  Laterality: Right;  70 mins  . TOTAL KNEE ARTHROPLASTY Left 06/28/2018   Procedure: LEFT TOTAL KNEE ARTHROPLASTY;  Surgeon: [name], MD;  [facility];  Service: Orthopedics;  Laterality: Left;  70 mins  . WISDOM TOOTH EXTRACTION      Current Medications: Current Meds  Medication Sig  . Accu-Chek FastClix Lancets MISC CHECK BLOOD SUGAR TWICE A DAY  . carvedilol (COREG) 25 MG tablet TAKE 1 TABLET BY MOUTH 2 TIMES DAILY.  Marland Kitchen ENTRESTO 24-26 MG TAKE 1 TABLET BY MOUTH TWICE A DAY  . glucose blood test strip 1 each by Other route as needed. Use as instructed  . glucose blood test strip 1 each by Other route as needed. Use as instructed Freestyle test stripts  . glucose monitoring kit (FREESTYLE) monitoring kit 1 each by Does not apply route as needed.  Marland Kitchen JARDIANCE 10 MG TABS tablet TAKE 1 TABLET BY MOUTH ONCE DAILY  . Lancets (FREESTYLE) lancets 1 each by Other route as needed. Use as instructed  . methocarbamol (ROBAXIN) 500 MG tablet Take 1 tablet (500 mg total) by mouth every 6 (six) hours as needed for muscle spasms.  . Multiple Vitamin (MULTIVITAMIN WITH MINERALS) TABS tablet Take 1 tablet by mouth daily.  Marland Kitchen oxymetazoline (AFRIN) 0.05 % nasal spray Place 1 spray into both nostrils at bedtime.   . pantoprazole (PROTONIX) 40 MG tablet Take 1 tablet (40 mg total) by mouth 2 (two) times daily.  . rosuvastatin (CRESTOR) 10 MG tablet Take 1 tablet (10 mg total) by mouth daily.  . Vitamin D, Cholecalciferol, 1000 units CAPS Take 1,000 Units by mouth daily.   Alveda Reasons 20 MG TABS tablet TAKE 1 TABLET BY MOUTH DAILY WITH SUPPER.     Allergies:   Morphine and related   Social History    Socioeconomic History  . Marital status: Married    Spouse name: Not on file  . Number of children: 2  . Years of education: Not on file  . Highest education level: Not on file  Occupational History  . Occupation: Theatre manager: [employer]  Tobacco Use  . Smoking status: Never  Smoker  . Smokeless tobacco: Never Used  Substance and Sexual Activity  . Alcohol use: Yes    Comment: 2/month  . Drug use: No  . Sexual activity: Yes  Other Topics Concern  . Not on file  Social History Narrative   Lives with spouse in [place]   Wife works for [employer] teaching program   Owns Advertising copywriter   Social Determinants of Health   Financial Resource Strain:   . Difficulty of Paying Living Expenses: Not on file  Food Insecurity:   . Worried About Charity fundraiser in the Last Year: Not on file  . Ran Out of Food in the Last Year: Not on file  Transportation Needs:   . Lack of Transportation (Medical): Not on file  . Lack of Transportation (Non-Medical): Not on file  Physical Activity:   . Days of Exercise per Week: Not on file  . Minutes of Exercise per Session: Not on file  Stress:   . Feeling of Stress : Not on file  Social Connections:   . Frequency of Communication with Friends and Family: Not on file  . Frequency of Social Gatherings with Friends and Family: Not on file  . Attends Religious Services: Not on file  . Active Member of Clubs or Organizations: Not on file  . Attends Archivist Meetings: Not on file  . Marital Status: Not on file     Family History: The patient's family history includes Cancer in an other family member; Crohn's disease in his mother; Esophageal cancer in his father; Obesity in an other family member; Uterine cancer (age of onset: 3) in his mother.  ROS:   Please see the history of present illness.     All other systems reviewed and are negative.  EKGs/Labs/Other Studies Reviewed:    The following  studies were reviewed today:   EKG:     Recent Labs: 03/23/2019: ALT 43 08/23/2019: BUN 25; Creatinine, Ser 0.98; Hemoglobin 15.4; Platelets 144; Potassium 4.1; Sodium 134  Recent Lipid Panel    Component Value Date/Time   CHOL 141 10/20/2018 0945   TRIG 142 10/20/2018 0945   HDL 42 10/20/2018 0945   CHOLHDL 3.4 10/20/2018 0945   CHOLHDL 4.1 10/13/2016 0956   VLDL 65 (H) 10/13/2016 0956   LDLCALC 71 10/20/2018 0945    Physical Exam:    Physical Exam: Blood pressure 120/90, pulse 67, height 5' 9"  (1.753 m), weight 275 lb (124.7 kg), SpO2 97 %.  GEN:  Middle age male,  Moderately obese  HEENT: Normal NECK: No JVD; No carotid bruits LYMPHATICS: No lymphadenopathy CARDIAC: Irreg. Irreg.  RESPIRATORY:  Clear to auscultation without rales, wheezing or rhonchi  ABDOMEN: Soft, non-tender, non-distended MUSCULOSKELETAL:  No edema; No deformity  SKIN: Warm and dry NEUROLOGIC:  Alert and oriented x 3   ASSESSMENT:    No diagnosis found. PLAN:    In order of problems listed above:  1. Chronic systolic congestive heart failure: [name] is seen today for follow-up visit for his chronic systolic congestive heart failure. On coreg and entresto Will consider increating his entresto or starting spiro at his next visit  Ive advised him to continue with a diet , weight loss porgram   2.  Atrial fibrillation: He has been on Xarelto in the past.  He was recently placed on rifampin for an artificial knee joint infection so the Xarelto was stopped.  He has been on Lovenox injections during that time.  3.  Hyperlipidemia:  Cont meds    Medication Adjustments/Labs and Tests Ordered: Current medicines are reviewed at length with the patient today.  Concerns regarding medicines are outlined above.  No orders of the defined types were placed in this encounter.  No orders of the defined types were placed in this encounter.   Patient Instructions  Medication Instructions:  Your provider  recommends that you continue on your current medications as directed. Please refer to the Current Medication list given to you today.   *If you need a refill on your cardiac medications before your next appointment, please call your pharmacy*  Lab Work: You will have labs when you return in 6 months.  Follow-Up: At [facility], you and your health needs are our priority.  As part of our continuing mission to provide you with exceptional heart care, we have created designated Provider Care Teams.  These Care Teams include your primary Cardiologist (physician) and Advanced Practice Providers (APPs -  Physician Assistants and Nurse Practitioners) who all work together to provide you with the care you need, when you need it. Your next appointment:   6 month(s) The format for your next appointment:   In Person Provider:   You may see Mertie Moores, MD or one of the following Advanced Practice Providers on your designated Care Team:    Richardson Dopp, PA-C  [place], [place]  Daune Perch, [place]     Signed, Mertie Moores, MD  09/27/2019 4:13 PM    [place]

## 2019-09-27 NOTE — Patient Instructions (Signed)
Medication Instructions:  Your provider recommends that you continue on your current medications as directed. Please refer to the Current Medication list given to you today.   *If you need a refill on your cardiac medications before your next appointment, please call your pharmacy*  Lab Work: You will have labs when you return in 6 months.  Follow-Up: At [facility], you and your health needs are our priority.  As part of our continuing mission to provide you with exceptional heart care, we have created designated Provider Care Teams.  These Care Teams include your primary Cardiologist (physician) and Advanced Practice Providers (APPs -  Physician Assistants and Nurse Practitioners) who all work together to provide you with the care you need, when you need it. Your next appointment:   6 month(s) The format for your next appointment:   In Person Provider:   You may see Mertie Moores, MD or one of the following Advanced Practice Providers on your designated Care Team:    Richardson Dopp, PA-C  [place], [place]  Daune Perch, [place]

## 2019-09-28 ENCOUNTER — Other Ambulatory Visit: Payer: Self-pay

## 2019-09-28 ENCOUNTER — Encounter: Payer: Self-pay | Admitting: Physical Therapy

## 2019-09-28 ENCOUNTER — Ambulatory Visit: Payer: 59 | Admitting: Physical Therapy

## 2019-09-28 DIAGNOSIS — Z96651 Presence of right artificial knee joint: Secondary | ICD-10-CM

## 2019-09-28 DIAGNOSIS — Z9889 Other specified postprocedural states: Secondary | ICD-10-CM | POA: Diagnosis not present

## 2019-09-28 DIAGNOSIS — R262 Difficulty in walking, not elsewhere classified: Secondary | ICD-10-CM

## 2019-09-28 DIAGNOSIS — M6281 Muscle weakness (generalized): Secondary | ICD-10-CM | POA: Diagnosis not present

## 2019-09-28 DIAGNOSIS — R600 Localized edema: Secondary | ICD-10-CM | POA: Diagnosis not present

## 2019-09-28 NOTE — Therapy (Signed)
[place], [name], [address] Phone: [phone]   Fax:  [phone]  Physical Therapy Treatment  Patient Details  Name: [name]. MRN: [id number] Date of Birth: 1961-03-02 Referring Provider (PT): Dr. [name]   Encounter Date: 09/28/2019  PT End of Session - 09/28/19 0919    Visit Number  13    Number of Visits  24    Date for PT Re-Evaluation  10/23/19    Authorization Type  [facility] UMR    PT Start Time  0915    PT Stop Time  1010    PT Time Calculation (min)  55 min    Activity Tolerance  Patient tolerated treatment well    Behavior During Therapy  [facility] for tasks assessed/performed       Past Medical History:  Diagnosis Date  . Arthritis   . Diabetes mellitus without complication ([place])    TYPE 2  . Diverticulosis   . Dyslipidemia   . GERD (gastroesophageal reflux disease)   . Herpes labialis   . Hypertension   . Longstanding persistent atrial fibrillation ([place])   . Metabolic syndrome   . Obesity   . Proteinuria   . Psoriasis   . Seborrheic dermatitis   . Sleep apnea    very compliant with CPAP, PT NEEDS TO BRING OWN MACHINE  . TIA (transient ischemic attack) 2009    Past Surgical History:  Procedure Laterality Date  . CARDIOVERSION N/A 12/02/2016   Procedure: CARDIOVERSION;  Surgeon: Pixie Casino, MD;  Location: [facility] ENDOSCOPY;  Service: Cardiovascular;  Laterality: N/A;  . COLONOSCOPY    . EXCISIONAL TOTAL KNEE ARTHROPLASTY WITH ANTIBIOTIC SPACERS Right 03/28/2019   Procedure: EXCISIONAL TOTAL KNEE ARTHROPLASTY WITH ANTIBIOTIC SPACERS;  Surgeon: [name], MD;  Location: [facility];  Service: Orthopedics;  Laterality: Right;  90 mins  . I & D KNEE WITH POLY EXCHANGE Right 09/10/2018   Procedure: Right Knee Arthroplasty IRRIGATION AND DEBRIDEMENT KNEE WITH POLY EXCHANGE;  Surgeon: [name], MD;  Location: [facility];  Service: Orthopedics;  Laterality: Right;  . JOINT  REPLACEMENT  11/03/10   LT HIP  . PFO occluder cardiac valve  2006   Dr. Einar Gip, (hole in heart)  . REIMPLANTATION OF TOTAL KNEE Right 06/15/2019   Procedure: Resection of antibiotic spacer and  irrigation and debridment and placement of new antibiotic spacer and components;  Surgeon: [name], MD;  Location: [facility];  Service: Orthopedics;  Laterality: Right;  90 mins  . REIMPLANTATION OF TOTAL KNEE Right 08/22/2019   Procedure: REIMPLANTATION OF TOTAL KNEE;  Surgeon: [name], MD;  Location: [facility];  Service: Orthopedics;  Laterality: Right;  120 mins  . TONSILLECTOMY AND ADENOIDECTOMY    . TOTAL HIP ARTHROPLASTY Left   . TOTAL KNEE ARTHROPLASTY Right 05/31/2018   Procedure: RIGHT TOTAL KNEE ARTHROPLASTY;  Surgeon: [name], MD;  Location: [facility];  Service: Orthopedics;  Laterality: Right;  70 mins  . TOTAL KNEE ARTHROPLASTY Left 06/28/2018   Procedure: LEFT TOTAL KNEE ARTHROPLASTY;  Surgeon: [name], MD;  Location: [facility];  Service: Orthopedics;  Laterality: Left;  70 mins  . WISDOM TOOTH EXTRACTION      There were no vitals filed for this visit.  Subjective Assessment - 09/28/19 0916    Subjective  Patient reports he was a little sore following last visit. He arrives today without walking stick.    Patient Stated Goals  Pt would like to  be able to return to yardwork, tennis    Currently in Pain?  Yes    Pain Score  2     Pain Location  Knee    Pain Orientation  Right    Pain Descriptors / Indicators  Sore;Tightness    Pain Type  Surgical pain    Pain Onset  More than a month ago    Pain Frequency  Constant         [facility] PT Assessment - 09/28/19 0001      AROM   Right Knee Extension  4    Right Knee Flexion  114                   [facility] Adult PT Treatment/Exercise - 09/28/19 0001      Knee/Hip Exercises: Stretches   Active Hamstring Stretch  3 reps;30 seconds    Active Hamstring Stretch Limitations  seated edge of mat    Quad Stretch  3  reps;30 seconds    Quad Stretch Limitations  supine, passive      Knee/Hip Exercises: Aerobic   Nustep  6 min LE only L6      Knee/Hip Exercises: Standing   Knee Flexion  15 reps    Knee Flexion Limitations  standing marching focusing on knee flexion      Knee/Hip Exercises: Seated   Long Arc Quad  20 reps      Knee/Hip Exercises: Supine   Quad Sets  10 reps   5 sec hold     Modalities   Modalities  Vasopneumatic      Vasopneumatic   Number Minutes Vasopneumatic   15 minutes    Vasopnuematic Location   Knee    Vasopneumatic Pressure  High    Vasopneumatic Temperature   34      Manual Therapy   Manual Therapy  Joint mobilization;Passive ROM    Manual therapy comments  Focused on improving knee motion    Joint Mobilization  AP and rotational tibiofemoral joint mobs in supine and seated, PF joint mobs in all directions    Passive ROM  Knee flexion and extension stretching in seated and supine, supine hamstring stretch              PT Education - 09/28/19 0918    Education Details  HEP, stretching    Person(s) Educated  Patient    Methods  Explanation;Demonstration;Tactile cues;Verbal cues;Other (comment)    Comprehension  Verbalized understanding;Returned demonstration;Verbal cues required;Need further instruction       PT Short Term Goals - 09/21/19 1012      PT SHORT TERM GOAL #1   Title  Pt will be I with initial HEP for Rt knee ROM and strength, balance    Time  4    Period  Weeks    Status  On-going    Target Date  09/25/19      PT SHORT TERM GOAL #2   Title  Pt will be able to walk with/without least restricted assistive device in clinic with min noticeable limp, improved heel strike.    Time  4    Period  Weeks    Status  Achieved    Target Date  09/25/19      PT SHORT TERM GOAL #3   Title  Pt will be able to sit with knee flexed to 95 deg for improved sit to stand symmetry and ease    Time  6    Period  Weeks    Status  Achieved    Target Date   09/25/19      PT SHORT TERM GOAL #4   Title  Pt will be able to demo passive knee extension to no more than 10 deg. to work towards near full extension and normal knee function.    Time  4    Period  Weeks    Status  Achieved    Target Date  09/25/19      PT SHORT TERM GOAL #5   Title  Pt will understand RICE in order to minimize pain and swelling in RT LE    Time  8    Period  Weeks    Status  Achieved    Target Date  09/25/19        PT Long Term Goals - 08/28/19 1949      PT LONG TERM GOAL #1   Title  Pt will improve FOTO score to no more than 49 % limited to demo improved functional mobility.    Baseline  64%    Time  8    Period  Weeks    Status  New    Target Date  10/23/19      PT LONG TERM GOAL #2   Title  Pt will be able to perform light recreational activites in the home and ADLs  with no more than min occasional difficulty    Time  8    Period  Weeks    Status  New    Target Date  10/23/19      PT LONG TERM GOAL #3   Title  Pt will be able to go up and down 12 stairs in his home with min difficulty, reciprocal and using 1 rail.    Time  8    Period  Weeks    Status  New    Target Date  10/23/19      PT LONG TERM GOAL #4   Title  Pt will demo 5/5 knee strength as well as hip abduction for normal, safe gait mechanics    Time  8    Period  Weeks    Status  New    Target Date  10/23/19      PT LONG TERM GOAL #5   Title  Pt will be able to extend knee AROM to lacking no more than 10 deg.    Time  8    Period  Weeks    Status  New    Target Date  10/23/19      Additional Long Term Goals   Additional Long Term Goals  Yes      PT LONG TERM GOAL #6   Title  Pt will be I with final HEP upong discharge for long term mobility and health.    Time  8    Period  Weeks    Status  New    Target Date  10/23/19            Plan - 09/28/19 0920    Clinical Impression Statement  Patient continues to exhibit improvement in his range of motion and is  tolerating strengthening exercises well. Todays visit focused mainly on manual therapy to improve range of motion of the right knee. He is progressing well and would benefit from continued skilled PT to progress his motion and strength to return to prior level of funciton.    PT Treatment/Interventions  ADLs/Self Care Home Management;Stair training;Functional  mobility training;Cryotherapy;Neuromuscular re-education;Balance training;Therapeutic exercise;Therapeutic activities;Manual lymph drainage;Taping;Vasopneumatic Device;Manual techniques;Passive range of motion;Patient/family education;DME Instruction;Gait training;Electrical Stimulation    PT Next Visit Plan  Assess HEP and progress PRN, ROM and strength as tol, recumbant, edema mgmt    PT Home Exercise Plan  QS, towel slide flexion, SLR, sit to stand no UE, calf stretch, heel raises, seated or supine hamstring stretch, standing hip abduction and extension    Consulted and Agree with Plan of Care  Patient       Patient will benefit from skilled therapeutic intervention in order to improve the following deficits and impairments:  Difficulty walking, Increased fascial restricitons, Decreased range of motion, Pain, Decreased skin integrity, Decreased balance, Decreased scar mobility, Hypomobility, Impaired flexibility, Decreased strength, Decreased mobility, Increased edema, Postural dysfunction  Visit Diagnosis: S/P right knee arthroscopy  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 Chronic systolic CHF (congestive heart failure) ([place]) 09/27/2019  . S/P right TK rev 08/22/2019  . Status post revision of total knee, right 06/15/2019  . 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 High risk medication use 11/08/2018  . Infected right TKA 09/10/2018  . S/P left TKA 06/28/2018  . S/P TKR (total knee replacement), right 05/31/2018  . Osteoarthritis of right knee 05/09/2018  . Tubular adenoma of colon 10/18/2017  . 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 Diabetic nephropathy associated with type 2 diabetes mellitus ([place]) 10/13/2016  . Permanent atrial fibrillation ([place]) 10/13/2016  . Health examination of defined subpopulation 11/15/2015  . Polycythemia vera ([place]) 10/09/2014  . Hyperlipidemia associated with type 2 diabetes mellitus ([place]) 09/17/2014  . Hypertension associated with diabetes ([place]) 09/17/2014  . Diabetes mellitus ([place]) 10/31/2013  . Hx-TIA (transient ischemic attack) 09/05/2012  . GERD (gastroesophageal reflux disease) 08/17/2011  . Obesity (BMI 30-39.9) 08/17/2011  . Psoriatic arthritis ([place]) 08/17/2011  . Obstructive sleep apnea 08/05/2009    Hilda Blades, PT, DPT, LAT, ATC 09/28/19  10:53 AM Phone: [phone] Fax: [facility] [address] [place], [name], [address] Phone: [phone]   Fax:  [phone]  Name: [name]. MRN: [id number] Date of Birth: [birth year]

## 2019-09-29 ENCOUNTER — Encounter: Payer: Self-pay | Admitting: Physical Therapy

## 2019-09-29 ENCOUNTER — Ambulatory Visit: Payer: 59 | Admitting: Physical Therapy

## 2019-09-29 ENCOUNTER — Other Ambulatory Visit: Payer: Self-pay

## 2019-09-29 DIAGNOSIS — M6281 Muscle weakness (generalized): Secondary | ICD-10-CM | POA: Diagnosis not present

## 2019-09-29 DIAGNOSIS — Z96651 Presence of right artificial knee joint: Secondary | ICD-10-CM

## 2019-09-29 DIAGNOSIS — R262 Difficulty in walking, not elsewhere classified: Secondary | ICD-10-CM | POA: Diagnosis not present

## 2019-09-29 DIAGNOSIS — Z9889 Other specified postprocedural states: Secondary | ICD-10-CM | POA: Diagnosis not present

## 2019-09-29 DIAGNOSIS — R600 Localized edema: Secondary | ICD-10-CM

## 2019-09-29 NOTE — Therapy (Signed)
[place], [name], [address] Phone: [phone]   Fax:  [phone]  Physical Therapy Treatment  Patient Details  Name: [name]. MRN: [id number] Date of Birth: 1961-06-24 Referring Provider (PT): Dr. [name]   Encounter Date: 09/29/2019  PT End of Session - 09/29/19 1047    Visit Number  14    Number of Visits  24    Date for PT Re-Evaluation  10/23/19    Authorization Type  [facility] U[facility]    PT Start Time  1045    PT Stop Time  1125    PT Time Calculation (min)  40 min    Activity Tolerance  Patient tolerated treatment well    Behavior During Therapy  [facility] for tasks assessed/performed       Past Medical History:  Diagnosis Date  . Arthritis   . Diabetes mellitus without complication ([place])    TYPE 2  . Diverticulosis   . Dyslipidemia   . GERD (gastroesophageal reflux disease)   . Herpes labialis   . Hypertension   . Longstanding persistent atrial fibrillation ([place])   . Metabolic syndrome   . Obesity   . Proteinuria   . Psoriasis   . Seborrheic dermatitis   . Sleep apnea    very compliant with CPAP, PT NEEDS TO BRING OWN MACHINE  . TIA (transient ischemic attack) 2009    Past Surgical History:  Procedure Laterality Date  . CARDIOVERSION N/A 12/02/2016   Procedure: CARDIOVERSION;  Surgeon: Pixie Casino, MD;  Location: [facility] ENDOSCOPY;  Service: Cardiovascular;  Laterality: N/A;  . COLONOSCOPY    . EXCISIONAL TOTAL KNEE ARTHROPLASTY WITH ANTIBIOTIC SPACERS Right 03/28/2019   Procedure: EXCISIONAL TOTAL KNEE ARTHROPLASTY WITH ANTIBIOTIC SPACERS;  Surgeon: [name], MD;  Location: [facility];  Service: Orthopedics;  Laterality: Right;  90 mins  . I & D KNEE WITH POLY EXCHANGE Right 09/10/2018   Procedure: Right Knee Arthroplasty IRRIGATION AND DEBRIDEMENT KNEE WITH POLY EXCHANGE;  Surgeon: [name], MD;  Location: [facility];  Service: Orthopedics;  Laterality: Right;  . JOINT  REPLACEMENT  11/03/10   LT HIP  . PFO occluder cardiac valve  2006   Dr. Einar Gip, (hole in heart)  . REIMPLANTATION OF TOTAL KNEE Right 06/15/2019   Procedure: Resection of antibiotic spacer and  irrigation and debridment and placement of new antibiotic spacer and components;  Surgeon: [name], MD;  Location: [facility];  Service: Orthopedics;  Laterality: Right;  90 mins  . REIMPLANTATION OF TOTAL KNEE Right 08/22/2019   Procedure: REIMPLANTATION OF TOTAL KNEE;  Surgeon: [name], MD;  Location: [facility];  Service: Orthopedics;  Laterality: Right;  120 mins  . TONSILLECTOMY AND ADENOIDECTOMY    . TOTAL HIP ARTHROPLASTY Left   . TOTAL KNEE ARTHROPLASTY Right 05/31/2018   Procedure: RIGHT TOTAL KNEE ARTHROPLASTY;  Surgeon: [name], MD;  Location: [facility];  Service: Orthopedics;  Laterality: Right;  70 mins  . TOTAL KNEE ARTHROPLASTY Left 06/28/2018   Procedure: LEFT TOTAL KNEE ARTHROPLASTY;  Surgeon: [name], MD;  Location: [facility];  Service: Orthopedics;  Laterality: Left;  70 mins  . WISDOM TOOTH EXTRACTION      There were no vitals filed for this visit.  Subjective Assessment - 09/29/19 1046    Subjective  Patient reports he is doing well this visit.    Patient Stated Goals  Pt would like to be able to return to yardwork, tennis  Currently in Pain?  Yes    Pain Score  2     Pain Location  Knee    Pain Orientation  Right    Pain Descriptors / Indicators  Sore    Pain Type  Surgical pain    Pain Onset  More than a month ago    Pain Frequency  Constant                       [facility] Adult PT Treatment/Exercise - 09/29/19 0001      Exercises   Exercises  Knee/Hip      Knee/Hip Exercises: Stretches   Active Hamstring Stretch  2 reps;30 seconds    Active Hamstring Stretch Limitations  seated edge of mat      Knee/Hip Exercises: Aerobic   Nustep  6 min LE only L5      Knee/Hip Exercises: Standing   Knee Flexion  20 reps    Knee Flexion Limitations   standing marching focusing on knee flexion      Knee/Hip Exercises: Seated   Long Arc Quad  2 sets;20 reps    Long Arc Quad Limitations  focusing on improving knee extension following stretching      Knee/Hip Exercises: Supine   Quad Sets  10 reps    Short Arc Quad Sets Limitations  with heel prop and manual pressure over knee      Manual Therapy   Manual Therapy  Joint mobilization;Passive ROM    Manual therapy comments  Focused on improving knee motion    Joint Mobilization  AP and rotational tibiofemoral joint mobs in supine and seated, PF joint mobs in all directions    Passive ROM  Knee flexion and extension stretching in seated and supine, supine hamstring stretch              PT Education - 09/29/19 1047    Education Details  HEP    Person(s) Educated  Patient    Methods  Explanation;Demonstration;Verbal cues    Comprehension  Verbalized understanding;Returned demonstration;Verbal cues required;Need further instruction       PT Short Term Goals - 09/21/19 1012      PT SHORT TERM GOAL #1   Title  Pt will be I with initial HEP for Rt knee ROM and strength, balance    Time  4    Period  Weeks    Status  On-going    Target Date  09/25/19      PT SHORT TERM GOAL #2   Title  Pt will be able to walk with/without least restricted assistive device in clinic with min noticeable limp, improved heel strike.    Time  4    Period  Weeks    Status  Achieved    Target Date  09/25/19      PT SHORT TERM GOAL #3   Title  Pt will be able to sit with knee flexed to 95 deg for improved sit to stand symmetry and ease    Time  6    Period  Weeks    Status  Achieved    Target Date  09/25/19      PT SHORT TERM GOAL #4   Title  Pt will be able to demo passive knee extension to no more than 10 deg. to work towards near full extension and normal knee function.    Time  4    Period  Weeks    Status  Achieved  Target Date  09/25/19      PT SHORT TERM GOAL #5   Title  Pt will  understand RICE in order to minimize pain and swelling in RT LE    Time  8    Period  Weeks    Status  Achieved    Target Date  09/25/19        PT Long Term Goals - 08/28/19 1949      PT LONG TERM GOAL #1   Title  Pt will improve FOTO score to no more than 49 % limited to demo improved functional mobility.    Baseline  64%    Time  8    Period  Weeks    Status  New    Target Date  10/23/19      PT LONG TERM GOAL #2   Title  Pt will be able to perform light recreational activites in the home and ADLs  with no more than min occasional difficulty    Time  8    Period  Weeks    Status  New    Target Date  10/23/19      PT LONG TERM GOAL #3   Title  Pt will be able to go up and down 12 stairs in his home with min difficulty, reciprocal and using 1 rail.    Time  8    Period  Weeks    Status  New    Target Date  10/23/19      PT LONG TERM GOAL #4   Title  Pt will demo 5/5 knee strength as well as hip abduction for normal, safe gait mechanics    Time  8    Period  Weeks    Status  New    Target Date  10/23/19      PT LONG TERM GOAL #5   Title  Pt will be able to extend knee AROM to lacking no more than 10 deg.    Time  8    Period  Weeks    Status  New    Target Date  10/23/19      Additional Long Term Goals   Additional Long Term Goals  Yes      PT LONG TERM GOAL #6   Title  Pt will be I with final HEP upong discharge for long term mobility and health.    Time  8    Period  Weeks    Status  New    Target Date  10/23/19            Plan - 09/29/19 1048    Clinical Impression Statement  Patient is doing well and tolerated therapy well. Therapy continues to focus on improving knee range of motion as he exhibits limitation in knee flexion and extension. He is walking without an AD most of the time and his strength seems to be doing well. He would benefit from continued skilled PT to progress motion as strength to improve walking ability and return to prior level  of function.    PT Treatment/Interventions  ADLs/Self Care Home Management;Stair training;Functional mobility training;Cryotherapy;Neuromuscular re-education;Balance training;Therapeutic exercise;Therapeutic activities;Manual lymph drainage;Taping;Vasopneumatic Device;Manual techniques;Passive range of motion;Patient/family education;DME Instruction;Gait training;Electrical Stimulation    PT Next Visit Plan  Assess HEP and progress PRN, ROM and strength as tol, recumbant, edema mgmt    PT Home Exercise Plan  QS, towel slide flexion, SLR, sit to stand no UE, calf stretch, heel raises, seated or  supine hamstring stretch, standing hip abduction and extension    Consulted and Agree with Plan of Care  Patient       Patient will benefit from skilled therapeutic intervention in order to improve the following deficits and impairments:  Difficulty walking, Increased fascial restricitons, Decreased range of motion, Pain, Decreased skin integrity, Decreased balance, Decreased scar mobility, Hypomobility, Impaired flexibility, Decreased strength, Decreased mobility, Increased edema, Postural dysfunction  Visit Diagnosis: S/P right knee arthroscopy  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 Chronic systolic CHF (congestive heart failure) ([place]) 09/27/2019  . S/P right TK rev 08/22/2019  . Status post revision of total knee, right 06/15/2019  . 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 High risk medication use 11/08/2018  . Infected right TKA 09/10/2018  . S/P left TKA  06/28/2018  . S/P TKR (total knee replacement), right 05/31/2018  . Osteoarthritis of right knee 05/09/2018  . Tubular adenoma of colon 10/18/2017  . 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 Diabetic nephropathy associated with type 2 diabetes mellitus ([place]) 10/13/2016  . Permanent atrial fibrillation ([place]) 10/13/2016  . Health examination of defined subpopulation 11/15/2015  . Polycythemia vera ([place]) 10/09/2014  . Hyperlipidemia associated with type 2 diabetes mellitus ([place]) 09/17/2014  . Hypertension associated with diabetes ([place]) 09/17/2014  . Diabetes mellitus ([place]) 10/31/2013  . Hx-TIA (transient ischemic attack) 09/05/2012  . GERD (gastroesophageal reflux disease) 08/17/2011  . Obesity (BMI 30-39.9) 08/17/2011  . Psoriatic arthritis ([place]) 08/17/2011  . Obstructive sleep apnea 08/05/2009    Hilda Blades, PT, DPT, LAT, ATC 09/29/19  11:36 AM Phone: [phone] Fax: [place] [facility] [address] [place], [name], [address] Phone: [phone]   Fax:  [phone]  Name: Armahn Trierweiler. MRN: [id number] Date of Birth: 25-Oct-1960

## 2019-10-02 ENCOUNTER — Other Ambulatory Visit: Payer: Self-pay

## 2019-10-02 ENCOUNTER — Ambulatory Visit: Payer: 59 | Attending: Orthopedic Surgery | Admitting: Physical Therapy

## 2019-10-02 ENCOUNTER — Other Ambulatory Visit: Payer: Self-pay | Admitting: Pharmacist

## 2019-10-02 ENCOUNTER — Encounter: Payer: Self-pay | Admitting: Physical Therapy

## 2019-10-02 DIAGNOSIS — Z96651 Presence of right artificial knee joint: Secondary | ICD-10-CM | POA: Diagnosis not present

## 2019-10-02 DIAGNOSIS — R262 Difficulty in walking, not elsewhere classified: Secondary | ICD-10-CM | POA: Diagnosis not present

## 2019-10-02 DIAGNOSIS — M6281 Muscle weakness (generalized): Secondary | ICD-10-CM | POA: Diagnosis not present

## 2019-10-02 DIAGNOSIS — R600 Localized edema: Secondary | ICD-10-CM | POA: Insufficient documentation

## 2019-10-02 DIAGNOSIS — Z9889 Other specified postprocedural states: Secondary | ICD-10-CM | POA: Diagnosis not present

## 2019-10-02 MED ORDER — TALTZ 80 MG/ML ~~LOC~~ SOAJ: 80.0000 mg | SUBCUTANEOUS | 11 refills | Status: DC

## 2019-10-02 NOTE — Therapy (Signed)
[place], [name], [address] Phone: [phone]   Fax:  [phone]  Physical Therapy Treatment  Patient Details  Name: [name]. MRN: [id number] Date of Birth: 1961/06/13 Referring Provider (PT): Dr. [name]   Encounter Date: 10/02/2019  PT End of Session - 10/02/19 1134    Visit Number  15    Number of Visits  24    Date for PT Re-Evaluation  10/23/19    Authorization Type  [facility] U[facility]    PT Start Time  1130    PT Stop Time  1225    PT Time Calculation (min)  55 min    Activity Tolerance  Patient tolerated treatment well    Behavior During Therapy  [facility] for tasks assessed/performed       Past Medical History:  Diagnosis Date  . Arthritis   . Diabetes mellitus without complication ([place])    TYPE 2  . Diverticulosis   . Dyslipidemia   . GERD (gastroesophageal reflux disease)   . Herpes labialis   . Hypertension   . Longstanding persistent atrial fibrillation ([place])   . Metabolic syndrome   . Obesity   . Proteinuria   . Psoriasis   . Seborrheic dermatitis   . Sleep apnea    very compliant with CPAP, PT NEEDS TO BRING OWN MACHINE  . TIA (transient ischemic attack) 2009    Past Surgical History:  Procedure Laterality Date  . CARDIOVERSION N/A 12/02/2016   Procedure: CARDIOVERSION;  Surgeon: Pixie Casino, MD;  Location: [facility] ENDOSCOPY;  Service: Cardiovascular;  Laterality: N/A;  . COLONOSCOPY    . EXCISIONAL TOTAL KNEE ARTHROPLASTY WITH ANTIBIOTIC SPACERS Right 03/28/2019   Procedure: EXCISIONAL TOTAL KNEE ARTHROPLASTY WITH ANTIBIOTIC SPACERS;  Surgeon: [name], MD;  Location: [facility] ORS;  Service: Orthopedics;  Laterality: Right;  90 mins  . I & D KNEE WITH POLY EXCHANGE Right 09/10/2018   Procedure: Right Knee Arthroplasty IRRIGATION AND DEBRIDEMENT KNEE WITH POLY EXCHANGE;  Surgeon: [name], MD;  Location: [facility];  Service: Orthopedics;  Laterality: Right;  . JOINT  REPLACEMENT  11/03/10   LT HIP  . PFO occluder cardiac valve  2006   Dr. Einar Gip, (hole in heart)  . REIMPLANTATION OF TOTAL KNEE Right 06/15/2019   Procedure: Resection of antibiotic spacer and  irrigation and debridment and placement of new antibiotic spacer and components;  Surgeon: [name], MD;  Location: [facility];  Service: Orthopedics;  Laterality: Right;  90 mins  . REIMPLANTATION OF TOTAL KNEE Right 08/22/2019   Procedure: REIMPLANTATION OF TOTAL KNEE;  Surgeon: [name], MD;  Location: [facility];  Service: Orthopedics;  Laterality: Right;  120 mins  . TONSILLECTOMY AND ADENOIDECTOMY    . TOTAL HIP ARTHROPLASTY Left   . TOTAL KNEE ARTHROPLASTY Right 05/31/2018   Procedure: RIGHT TOTAL KNEE ARTHROPLASTY;  Surgeon: [name], MD;  Location: [facility];  Service: Orthopedics;  Laterality: Right;  70 mins  . TOTAL KNEE ARTHROPLASTY Left 06/28/2018   Procedure: LEFT TOTAL KNEE ARTHROPLASTY;  Surgeon: [name], MD;  Location: [facility];  Service: Orthopedics;  Laterality: Left;  70 mins  . WISDOM TOOTH EXTRACTION      There were no vitals filed for this visit.  Subjective Assessment - 10/02/19 1131    Subjective  Patient reports he is doing well. He did some more walking this weekend without an AD.    Patient Stated Goals  Pt would like to  be able to return to yardwork, tennis    Currently in Pain?  Yes    Pain Score  2     Pain Location  Knee    Pain Orientation  Right    Pain Descriptors / Indicators  Sore    Pain Type  Surgical pain    Pain Onset  More than a month ago    Pain Frequency  Constant         [facility] PT Assessment - 10/02/19 0001      AROM   Right Knee Flexion  112                   [facility] Adult PT Treatment/Exercise - 10/02/19 0001      Exercises   Exercises  Knee/Hip      Knee/Hip Exercises: Stretches   Active Hamstring Stretch  2 reps;30 seconds    Active Hamstring Stretch Limitations  seated edge of mat      Knee/Hip Exercises: Aerobic    Nustep  6 min LE only L5      Knee/Hip Exercises: Standing   Heel Raises  20 reps    Knee Flexion  20 reps    Knee Flexion Limitations  standing marching focusing on knee flexion    Hip Abduction  15 reps    Hip Extension  15 reps    Forward Step Up  10 reps    Forward Step Up Limitations  7" step      Knee/Hip Exercises: Seated   Long Arc Quad  20 reps    Sit to [employer]  2 sets;15 reps;without UE support      Knee/Hip Exercises: Supine   Quad Sets  5 reps   5 sec hold   Short Arc Quad Sets Limitations  with heel prop and manual pressure over knee      Modalities   Modalities  Vasopneumatic      Vasopneumatic   Number Minutes Vasopneumatic   15 minutes    Vasopnuematic Location   Knee    Vasopneumatic Pressure  High    Vasopneumatic Temperature   34      Manual Therapy   Manual Therapy  Joint mobilization;Passive ROM    Manual therapy comments  Focused on improving knee motion    Joint Mobilization  AP and rotational tibiofemoral joint mobs in supine and seated, PF joint mobs in all directions    Passive ROM  Knee flexion and extension stretching in seated and supine, supine hamstring stretch              PT Education - 10/02/19 1133    Education Details  HEP    Person(s) Educated  Patient    Methods  Explanation;Demonstration;Verbal cues    Comprehension  Verbalized understanding;Returned demonstration;Verbal cues required;Need further instruction       PT Short Term Goals - 09/21/19 1012      PT SHORT TERM GOAL #1   Title  Pt will be I with initial HEP for Rt knee ROM and strength, balance    Time  4    Period  Weeks    Status  On-going    Target Date  09/25/19      PT SHORT TERM GOAL #2   Title  Pt will be able to walk with/without least restricted assistive device in clinic with min noticeable limp, improved heel strike.    Time  4    Period  Weeks    Status  Achieved    Target Date  09/25/19      PT SHORT TERM GOAL #3   Title  Pt will be able  to sit with knee flexed to 95 deg for improved sit to stand symmetry and ease    Time  6    Period  Weeks    Status  Achieved    Target Date  09/25/19      PT SHORT TERM GOAL #4   Title  Pt will be able to demo passive knee extension to no more than 10 deg. to work towards near full extension and normal knee function.    Time  4    Period  Weeks    Status  Achieved    Target Date  09/25/19      PT SHORT TERM GOAL #5   Title  Pt will understand RICE in order to minimize pain and swelling in RT LE    Time  8    Period  Weeks    Status  Achieved    Target Date  09/25/19        PT Long Term Goals - 08/28/19 1949      PT LONG TERM GOAL #1   Title  Pt will improve FOTO score to no more than 49 % limited to demo improved functional mobility.    Baseline  64%    Time  8    Period  Weeks    Status  New    Target Date  10/23/19      PT LONG TERM GOAL #2   Title  Pt will be able to perform light recreational activites in the home and ADLs  with no more than min occasional difficulty    Time  8    Period  Weeks    Status  New    Target Date  10/23/19      PT LONG TERM GOAL #3   Title  Pt will be able to go up and down 12 stairs in his home with min difficulty, reciprocal and using 1 rail.    Time  8    Period  Weeks    Status  New    Target Date  10/23/19      PT LONG TERM GOAL #4   Title  Pt will demo 5/5 knee strength as well as hip abduction for normal, safe gait mechanics    Time  8    Period  Weeks    Status  New    Target Date  10/23/19      PT LONG TERM GOAL #5   Title  Pt will be able to extend knee AROM to lacking no more than 10 deg.    Time  8    Period  Weeks    Status  New    Target Date  10/23/19      Additional Long Term Goals   Additional Long Term Goals  Yes      PT LONG TERM GOAL #6   Title  Pt will be I with final HEP upong discharge for long term mobility and health.    Time  8    Period  Weeks    Status  New    Target Date  10/23/19             Plan - 10/02/19 1135    Clinical Impression Statement  Patient is doing well but continues to exhibit limitation in knee range of motion.  He is progressing well with his strengthening and tolerance for walking. He would benefit from continued skilled PT to progress motion as strength to improve walking ability and return to prior level of function.    PT Treatment/Interventions  ADLs/Self Care Home Management;Stair training;Functional mobility training;Cryotherapy;Neuromuscular re-education;Balance training;Therapeutic exercise;Therapeutic activities;Manual lymph drainage;Taping;Vasopneumatic Device;Manual techniques;Passive range of motion;Patient/family education;DME Instruction;Gait training;Electrical Stimulation    PT Next Visit Plan  Assess HEP and progress PRN, ROM and strength as tol, recumbant, edema mgmt    PT Home Exercise Plan  QS, towel slide flexion, SLR, sit to stand no UE, calf stretch, heel raises, seated or supine hamstring stretch, standing hip abduction and extension    Consulted and Agree with Plan of Care  Patient       Patient will benefit from skilled therapeutic intervention in order to improve the following deficits and impairments:  Difficulty walking, Increased fascial restricitons, Decreased range of motion, Pain, Decreased skin integrity, Decreased balance, Decreased scar mobility, Hypomobility, Impaired flexibility, Decreased strength, Decreased mobility, Increased edema, Postural dysfunction  Visit Diagnosis: S/P right knee arthroscopy  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 Chronic systolic CHF (congestive heart failure) ([place]) 09/27/2019  . S/P right TK rev 08/22/2019  . Status post revision of total knee, right 06/15/2019  . 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 High risk medication use 11/08/2018  . Infected right TKA 09/10/2018  . S/P left TKA 06/28/2018  . S/P TKR (total knee replacement), right 05/31/2018  . Osteoarthritis of right knee 05/09/2018  . Tubular adenoma of colon 10/18/2017  . 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 Diabetic nephropathy associated with type 2 diabetes mellitus ([place]) 10/13/2016  . Permanent atrial fibrillation ([place]) 10/13/2016  . Health examination of defined subpopulation 11/15/2015  . Polycythemia vera ([place]) 10/09/2014  . Hyperlipidemia associated with type 2 diabetes mellitus ([place]) 09/17/2014  . Hypertension associated with diabetes ([place]) 09/17/2014  . Diabetes mellitus ([place]) 10/31/2013  . Hx-TIA (transient ischemic attack) 09/05/2012  . GERD (gastroesophageal reflux disease) 08/17/2011  . Obesity (BMI 30-39.9) 08/17/2011  . Psoriatic arthritis ([place]) 08/17/2011  . Obstructive sleep apnea 08/05/2009    Hilda Blades, PT, DPT, LAT, ATC 10/02/19  12:29 PM Phone: [phone] Fax: [facility] [address] [place], [name], [address] Phone: [phone]   Fax:  [phone]  Name: [name]. MRN: [id number] Date of Birth: December 05, 1960

## 2019-10-03 MED FILL — TALTZ 80 MG/ML AUTOINJECTOR: 80 | 28 days supply | Qty: 1 | Fill #0

## 2019-10-04 ENCOUNTER — Ambulatory Visit: Payer: 59 | Admitting: Physical Therapy

## 2019-10-04 ENCOUNTER — Other Ambulatory Visit: Payer: Self-pay

## 2019-10-04 ENCOUNTER — Encounter: Payer: Self-pay | Admitting: Physical Therapy

## 2019-10-04 DIAGNOSIS — Z96651 Presence of right artificial knee joint: Secondary | ICD-10-CM | POA: Diagnosis not present

## 2019-10-04 DIAGNOSIS — Z9889 Other specified postprocedural states: Secondary | ICD-10-CM

## 2019-10-04 DIAGNOSIS — M6281 Muscle weakness (generalized): Secondary | ICD-10-CM | POA: Diagnosis not present

## 2019-10-04 DIAGNOSIS — R262 Difficulty in walking, not elsewhere classified: Secondary | ICD-10-CM

## 2019-10-04 DIAGNOSIS — R600 Localized edema: Secondary | ICD-10-CM

## 2019-10-04 NOTE — Therapy (Signed)
[place], [name], [address] Phone: [phone]   Fax:  [phone]  Physical Therapy Treatment  Patient Details  Name: [name]. MRN: [id number] Date of Birth: Jul 25, 1961 Referring Provider (PT): Dr. [name]   Encounter Date: 10/04/2019  PT End of Session - 10/04/19 1130    Visit Number  16    Number of Visits  24    Date for PT Re-Evaluation  10/23/19    Authorization Type  [facility] UMR    PT Start Time  1045    PT Stop Time  1140    PT Time Calculation (min)  55 min    Activity Tolerance  Patient tolerated treatment well    Behavior During Therapy  [facility] for tasks assessed/performed       Past Medical History:  Diagnosis Date  . Arthritis   . Diabetes mellitus without complication ([place])    TYPE 2  . Diverticulosis   . Dyslipidemia   . GERD (gastroesophageal reflux disease)   . Herpes labialis   . Hypertension   . Longstanding persistent atrial fibrillation ([place])   . Metabolic syndrome   . Obesity   . Proteinuria   . Psoriasis   . Seborrheic dermatitis   . Sleep apnea    very compliant with CPAP, PT NEEDS TO BRING OWN MACHINE  . TIA (transient ischemic attack) 2009    Past Surgical History:  Procedure Laterality Date  . CARDIOVERSION N/A 12/02/2016   Procedure: CARDIOVERSION;  Surgeon: Pixie Casino, MD;  Location: [facility] ENDOSCOPY;  Service: Cardiovascular;  Laterality: N/A;  . COLONOSCOPY    . EXCISIONAL TOTAL KNEE ARTHROPLASTY WITH ANTIBIOTIC SPACERS Right 03/28/2019   Procedure: EXCISIONAL TOTAL KNEE ARTHROPLASTY WITH ANTIBIOTIC SPACERS;  Surgeon: [name], MD;  [facility]: [facility];  Service: Orthopedics;  Laterality: Right;  90 mins  . I & D KNEE WITH POLY EXCHANGE Right 09/10/2018   Procedure: Right Knee Arthroplasty IRRIGATION AND DEBRIDEMENT KNEE WITH POLY EXCHANGE;  Surgeon: [name], MD;  Location: [facility];  Service: Orthopedics;  Laterality: Right;  . JOINT  REPLACEMENT  11/03/10   LT HIP  . PFO occluder cardiac valve  2006   Dr. Einar Gip, (hole in heart)  . REIMPLANTATION OF TOTAL KNEE Right 06/15/2019   Procedure: Resection of antibiotic spacer and  irrigation and debridment and placement of new antibiotic spacer and components;  Surgeon: [name], MD;  Location: [facility];  Service: Orthopedics;  Laterality: Right;  90 mins  . REIMPLANTATION OF TOTAL KNEE Right 08/22/2019   Procedure: REIMPLANTATION OF TOTAL KNEE;  Surgeon: [name], MD;  Location: [facility];  Service: Orthopedics;  Laterality: Right;  120 mins  . TONSILLECTOMY AND ADENOIDECTOMY    . TOTAL HIP ARTHROPLASTY Left   . TOTAL KNEE ARTHROPLASTY Right 05/31/2018   Procedure: RIGHT TOTAL KNEE ARTHROPLASTY;  Surgeon: [name], MD;  Location: [facility];  Service: Orthopedics;  Laterality: Right;  70 mins  . TOTAL KNEE ARTHROPLASTY Left 06/28/2018   Procedure: LEFT TOTAL KNEE ARTHROPLASTY;  Surgeon: [name], MD;  Location: [facility];  Service: Orthopedics;  Laterality: Left;  70 mins  . WISDOM TOOTH EXTRACTION      There were no vitals filed for this visit.  Subjective Assessment - 10/04/19 1048    Subjective  Patient reports he was a little sore following last visit and he has been spending more time on his feet and walking.    Patient Stated  Goals  Pt would like to be able to return to yardwork, tennis    Currently in Pain?  Yes    Pain Score  3     Pain Location  Knee    Pain Orientation  Right    Pain Descriptors / Indicators  Sore    Pain Type  Surgical pain    Pain Onset  More than a month ago    Pain Frequency  Constant         [facility] PT Assessment - 10/04/19 0001      AROM   Right Knee Flexion  114                   [facility] Adult PT Treatment/Exercise - 10/04/19 0001      Exercises   Exercises  Knee/Hip      Knee/Hip Exercises: Stretches   Gastroc Stretch  3 reps;20 seconds    Gastroc Stretch Limitations  standing    Other Knee/Hip Stretches   Standing knee flexion stretch with foot on step 10 sec x10      Knee/Hip Exercises: Aerobic   Recumbent Bike  5 min for knee flexion ROM    Nustep  6 min LE only L5      Knee/Hip Exercises: Standing   Heel Raises  20 reps    Heel Raises Limitations  edge of step    Knee Flexion  20 reps    Knee Flexion Limitations  standing marching focusing on knee flexion    Hip Abduction  15 reps    Hip Extension  15 reps      Knee/Hip Exercises: Seated   Long Arc Quad  20 reps   2 sets   Long Arc Quad Weight  3 lbs.    Hamstring Curl  10 reps   2 sets   Hamstring Limitations  red band    Sit to [place]  20 reps;without UE support      Modalities   Modalities  Vasopneumatic      Vasopneumatic   Number Minutes Vasopneumatic   15 minutes    Vasopnuematic Location   Knee    Vasopneumatic Pressure  High    Vasopneumatic Temperature   34      Manual Therapy   Manual Therapy  Joint mobilization;Passive ROM    Manual therapy comments  Focused on improving knee motion    Joint Mobilization  AP and rotational tibiofemoral joint mobs in supine and seated, PF joint mobs in all directions    Soft tissue mobilization  Prone right calf and hamstring with roller    Passive ROM  Knee flexion and extension stretching in seated             PT Education - 10/04/19 1101    Education Details  HEP    Person(s) Educated  Patient    Methods  Explanation;Demonstration;Verbal cues;Handout    Comprehension  Verbalized understanding;Returned demonstration;Verbal cues required;Need further instruction       PT Short Term Goals - 09/21/19 1012      PT SHORT TERM GOAL #1   Title  Pt will be I with initial HEP for Rt knee ROM and strength, balance    Time  4    Period  Weeks    Status  On-going    Target Date  09/25/19      PT SHORT TERM GOAL #2   Title  Pt will be able to walk with/without least restricted assistive device  in clinic with min noticeable limp, improved heel strike.    Time  4     Period  Weeks    Status  Achieved    Target Date  09/25/19      PT SHORT TERM GOAL #3   Title  Pt will be able to sit with knee flexed to 95 deg for improved sit to stand symmetry and ease    Time  6    Period  Weeks    Status  Achieved    Target Date  09/25/19      PT SHORT TERM GOAL #4   Title  Pt will be able to demo passive knee extension to no more than 10 deg. to work towards near full extension and normal knee function.    Time  4    Period  Weeks    Status  Achieved    Target Date  09/25/19      PT SHORT TERM GOAL #5   Title  Pt will understand RICE in order to minimize pain and swelling in RT LE    Time  8    Period  Weeks    Status  Achieved    Target Date  09/25/19        PT Long Term Goals - 08/28/19 1949      PT LONG TERM GOAL #1   Title  Pt will improve FOTO score to no more than 49 % limited to demo improved functional mobility.    Baseline  64%    Time  8    Period  Weeks    Status  New    Target Date  10/23/19      PT LONG TERM GOAL #2   Title  Pt will be able to perform light recreational activites in the home and ADLs  with no more than min occasional difficulty    Time  8    Period  Weeks    Status  New    Target Date  10/23/19      PT LONG TERM GOAL #3   Title  Pt will be able to go up and down 12 stairs in his home with min difficulty, reciprocal and using 1 rail.    Time  8    Period  Weeks    Status  New    Target Date  10/23/19      PT LONG TERM GOAL #4   Title  Pt will demo 5/5 knee strength as well as hip abduction for normal, safe gait mechanics    Time  8    Period  Weeks    Status  New    Target Date  10/23/19      PT LONG TERM GOAL #5   Title  Pt will be able to extend knee AROM to lacking no more than 10 deg.    Time  8    Period  Weeks    Status  New    Target Date  10/23/19      Additional Long Term Goals   Additional Long Term Goals  Yes      PT LONG TERM GOAL #6   Title  Pt will be I with final HEP upong  discharge for long term mobility and health.    Time  8    Period  Weeks    Status  New    Target Date  10/23/19  Plan - 10/04/19 1131    Clinical Impression Statement  Patient is doing well and progressing with his strengthening exercises and standing/walking tolerance. His knee range of motion continues to be limited with both extension and flexion. He was encouraged to continue working on stretching at home. He would benefit from continued skilled PT to progress his range of motion and strength to allow for return to prior level of function.    PT Treatment/Interventions  ADLs/Self Care Home Management;Stair training;Functional mobility training;Cryotherapy;Neuromuscular re-education;Balance training;Therapeutic exercise;Therapeutic activities;Manual lymph drainage;Taping;Vasopneumatic Device;Manual techniques;Passive range of motion;Patient/family education;DME Instruction;Gait training;Electrical Stimulation    PT Next Visit Plan  Assess HEP and progress PRN, ROM and strength as tol, recumbant, edema mgmt    PT Home Exercise Plan  QS, towel slide flexion, SLR, sit to stand no UE, calf stretch, heel raises, seated or supine hamstring stretch, standing hip abduction and extension    Consulted and Agree with Plan of Care  Patient       Patient will benefit from skilled therapeutic intervention in order to improve the following deficits and impairments:  Difficulty walking, Increased fascial restricitons, Decreased range of motion, Pain, Decreased skin integrity, Decreased balance, Decreased scar mobility, Hypomobility, Impaired flexibility, Decreased strength, Decreased mobility, Increased edema, Postural dysfunction  Visit Diagnosis: S/P right knee arthroscopy  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 Chronic systolic CHF (congestive heart failure) ([place]) 09/27/2019  . S/P right TK rev 08/22/2019  . Status post revision of total knee, right 06/15/2019  . 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 High risk medication use 11/08/2018  . Infected right TKA 09/10/2018  . S/P left TKA 06/28/2018  . S/P TKR (total knee replacement), right 05/31/2018  . Osteoarthritis of right knee 05/09/2018  . Tubular adenoma of colon 10/18/2017  . 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 Diabetic nephropathy associated with type 2 diabetes mellitus ([place]) 10/13/2016  . Permanent atrial fibrillation ([place]) 10/13/2016  . Health examination of defined subpopulation 11/15/2015  . Polycythemia vera ([place]) 10/09/2014  . Hyperlipidemia associated with type 2 diabetes mellitus ([place]) 09/17/2014  . Hypertension associated with diabetes ([place]) 09/17/2014  . Diabetes mellitus ([place]) 10/31/2013  . Hx-TIA (transient ischemic attack) 09/05/2012  . GERD (gastroesophageal reflux disease) 08/17/2011  . Obesity (BMI 30-39.9) 08/17/2011  . Psoriatic arthritis ([place]) 08/17/2011  . Obstructive sleep apnea 08/05/2009    Hilda Blades, PT, DPT, LAT, ATC 10/04/19  11:41 AM Phone: [phone] Fax: [facility] [address] [place], [name], [address] Phone: [phone]   Fax:  [phone]  Name: Jens Renard. MRN: [id number] Date of Birth: [birth year]

## 2019-10-06 DIAGNOSIS — Z471 Aftercare following joint replacement surgery: Secondary | ICD-10-CM | POA: Diagnosis not present

## 2019-10-06 DIAGNOSIS — Z96651 Presence of right artificial knee joint: Secondary | ICD-10-CM | POA: Diagnosis not present

## 2019-10-06 MED FILL — CELECOXIB 200 MG CAP: 200 | 30 days supply | Qty: 30 | Fill #0

## 2019-10-10 ENCOUNTER — Other Ambulatory Visit: Payer: Self-pay

## 2019-10-10 ENCOUNTER — Ambulatory Visit: Payer: 59 | Admitting: Physical Therapy

## 2019-10-10 ENCOUNTER — Encounter: Payer: Self-pay | Admitting: Physical Therapy

## 2019-10-10 DIAGNOSIS — R262 Difficulty in walking, not elsewhere classified: Secondary | ICD-10-CM

## 2019-10-10 DIAGNOSIS — M6281 Muscle weakness (generalized): Secondary | ICD-10-CM | POA: Diagnosis not present

## 2019-10-10 DIAGNOSIS — Z96651 Presence of right artificial knee joint: Secondary | ICD-10-CM | POA: Diagnosis not present

## 2019-10-10 DIAGNOSIS — Z9889 Other specified postprocedural states: Secondary | ICD-10-CM

## 2019-10-10 DIAGNOSIS — R600 Localized edema: Secondary | ICD-10-CM

## 2019-10-10 NOTE — Therapy (Signed)
[place], [name], [address] Phone: [phone]   Fax:  [phone]  Physical Therapy Treatment  Patient Details  Name: [name]. MRN: [id number] Date of Birth: 1961/01/30 Referring Provider (PT): Dr. [name]   Encounter Date: 10/10/2019  PT End of Session - 10/10/19 0922    Visit Number  17    Number of Visits  24    Date for PT Re-Evaluation  10/23/19    Authorization Type  [facility]    PT Start Time  0915    PT Stop Time  1015    PT Time Calculation (min)  60 min    Activity Tolerance  Patient tolerated treatment well    Behavior During Therapy  [facility] for tasks assessed/performed       Past Medical History:  Diagnosis Date  . Arthritis   . Diabetes mellitus without complication ([place])    TYPE 2  . Diverticulosis   . Dyslipidemia   . GERD (gastroesophageal reflux disease)   . Herpes labialis   . Hypertension   . Longstanding persistent atrial fibrillation ([place])   . Metabolic syndrome   . Obesity   . Proteinuria   . Psoriasis   . Seborrheic dermatitis   . Sleep apnea    very compliant with CPAP, PT NEEDS TO BRING OWN MACHINE  . TIA (transient ischemic attack) 2009    Past Surgical History:  Procedure Laterality Date  . CARDIOVERSION N/A 12/02/2016   Procedure: CARDIOVERSION;  Surgeon: Pixie Casino, MD;  Location: [facility] ENDOSCOPY;  Service: Cardiovascular;  Laterality: N/A;  . COLONOSCOPY    . EXCISIONAL TOTAL KNEE ARTHROPLASTY WITH ANTIBIOTIC SPACERS Right 03/28/2019   Procedure: EXCISIONAL TOTAL KNEE ARTHROPLASTY WITH ANTIBIOTIC SPACERS;  Surgeon: [name], MD;  Location: [facility]S;  Service: Orthopedics;  Laterality: Right;  90 mins  . I & D KNEE WITH POLY EXCHANGE Right 09/10/2018   Procedure: Right Knee Arthroplasty IRRIGATION AND DEBRIDEMENT KNEE WITH POLY EXCHANGE;  Surgeon: [name], MD;  Location: [facility];  Service: Orthopedics;  Laterality: Right;  . JOINT  REPLACEMENT  11/03/10   LT HIP  . PFO occluder cardiac valve  2006   Dr. Einar Gip, (hole in heart)  . REIMPLANTATION OF TOTAL KNEE Right 06/15/2019   Procedure: Resection of antibiotic spacer and  irrigation and debridment and placement of new antibiotic spacer and components;  Surgeon: [name], MD;  Location: [facility];  Service: Orthopedics;  Laterality: Right;  90 mins  . REIMPLANTATION OF TOTAL KNEE Right 08/22/2019   Procedure: REIMPLANTATION OF TOTAL KNEE;  Surgeon: [name], MD;  Location: [facility];  Service: Orthopedics;  Laterality: Right;  120 mins  . TONSILLECTOMY AND ADENOIDECTOMY    . TOTAL HIP ARTHROPLASTY Left   . TOTAL KNEE ARTHROPLASTY Right 05/31/2018   Procedure: RIGHT TOTAL KNEE ARTHROPLASTY;  Surgeon: [name], MD;  Location: [facility];  Service: Orthopedics;  Laterality: Right;  70 mins  . TOTAL KNEE ARTHROPLASTY Left 06/28/2018   Procedure: LEFT TOTAL KNEE ARTHROPLASTY;  Surgeon: [name], MD;  Location: [facility];  Service: Orthopedics;  Laterality: Left;  70 mins  . WISDOM TOOTH EXTRACTION      There were no vitals filed for this visit.  Subjective Assessment - 10/10/19 0917    Subjective  Patient reports he is doing well. He saw his doctor this past week and was told he needs to return in 6 more weeks.  Patient Stated Goals  Pt would like to be able to return to yardwork, tennis    Currently in Pain?  Yes    Pain Score  2     Pain Location  Knee    Pain Orientation  Right    Pain Descriptors / Indicators  Sore    Pain Type  Surgical pain    Pain Onset  More than a month ago    Pain Frequency  Constant         [facility] PT Assessment - 10/10/19 0001      AROM   Right Knee Extension  4    Right Knee Flexion  113                   [facility] Adult PT Treatment/Exercise - 10/10/19 0001      Exercises   Exercises  Knee/Hip      Knee/Hip Exercises: Stretches   Passive Hamstring Stretch  2 reps;30 seconds    Passive Hamstring Stretch  Limitations  supine      Knee/Hip Exercises: Aerobic   Nustep  6 min LE only L5      Knee/Hip Exercises: Seated   Long Arc Quad Limitations  3x15 with 3#    Hamstring Limitations  3x15 with red band    Sit to [employer]  2 sets;20 reps;without UE support      Modalities   Modalities  Vasopneumatic      Vasopneumatic   Number Minutes Vasopneumatic   15 minutes    Vasopnuematic Location   Knee    Vasopneumatic Pressure  High    Vasopneumatic Temperature   34      Manual Therapy   Manual Therapy  Joint mobilization;Passive ROM;Soft tissue mobilization    Manual therapy comments  Focused on improving knee motion    Joint Mobilization  AP and rotational tibiofemoral joint mobs in supine and seated, PF joint mobs in all directions    Soft tissue mobilization  Calf, quad    Passive ROM  Knee flexion and extension in supine and stretching in seated             PT Education - 10/10/19 0921    Education Details  HEP    Person(s) Educated  Patient    Methods  Explanation;Demonstration;Verbal cues    Comprehension  Verbalized understanding;Returned demonstration;Verbal cues required;Need further instruction       PT Short Term Goals - 09/21/19 1012      PT SHORT TERM GOAL #1   Title  Pt will be I with initial HEP for Rt knee ROM and strength, balance    Time  4    Period  Weeks    Status  On-going    Target Date  09/25/19      PT SHORT TERM GOAL #2   Title  Pt will be able to walk with/without least restricted assistive device in clinic with min noticeable limp, improved heel strike.    Time  4    Period  Weeks    Status  Achieved    Target Date  09/25/19      PT SHORT TERM GOAL #3   Title  Pt will be able to sit with knee flexed to 95 deg for improved sit to stand symmetry and ease    Time  6    Period  Weeks    Status  Achieved    Target Date  09/25/19      PT  SHORT TERM GOAL #4   Title  Pt will be able to demo passive knee extension to no more than 10 deg. to work  towards near full extension and normal knee function.    Time  4    Period  Weeks    Status  Achieved    Target Date  09/25/19      PT SHORT TERM GOAL #5   Title  Pt will understand RICE in order to minimize pain and swelling in RT LE    Time  8    Period  Weeks    Status  Achieved    Target Date  09/25/19        PT Long Term Goals - 08/28/19 1949      PT LONG TERM GOAL #1   Title  Pt will improve FOTO score to no more than 49 % limited to demo improved functional mobility.    Baseline  64%    Time  8    Period  Weeks    Status  New    Target Date  10/23/19      PT LONG TERM GOAL #2   Title  Pt will be able to perform light recreational activites in the home and ADLs  with no more than min occasional difficulty    Time  8    Period  Weeks    Status  New    Target Date  10/23/19      PT LONG TERM GOAL #3   Title  Pt will be able to go up and down 12 stairs in his home with min difficulty, reciprocal and using 1 rail.    Time  8    Period  Weeks    Status  New    Target Date  10/23/19      PT LONG TERM GOAL #4   Title  Pt will demo 5/5 knee strength as well as hip abduction for normal, safe gait mechanics    Time  8    Period  Weeks    Status  New    Target Date  10/23/19      PT LONG TERM GOAL #5   Title  Pt will be able to extend knee AROM to lacking no more than 10 deg.    Time  8    Period  Weeks    Status  New    Target Date  10/23/19      Additional Long Term Goals   Additional Long Term Goals  Yes      PT LONG TERM GOAL #6   Title  Pt will be I with final HEP upong discharge for long term mobility and health.    Time  8    Period  Weeks    Status  New    Target Date  10/23/19            Plan - 10/10/19 [id number]    Clinical Impression Statement  Patient is continuing to progress well with his strengthening and has been tolerating greater levels of weight bearing activity such as standing and walking while at work. He did exhibit slightly more  swelling this visit and his continues to demonstrate limited knee motion. He would benefit from continued skilled PT to progress his range of motion and strength to allow for return to prior level of function.    PT Treatment/Interventions  ADLs/Self Care Home Management;Stair training;Functional mobility training;Cryotherapy;Neuromuscular re-education;Balance training;Therapeutic exercise;Therapeutic activities;Manual lymph drainage;Taping;Vasopneumatic Device;Manual  techniques;Passive range of motion;Patient/family education;DME Instruction;Gait training;Electrical Stimulation    PT Next Visit Plan  Assess HEP and progress PRN, ROM and strength as tol, recumbant, edema mgmt    PT Home Exercise Plan  QS, towel slide flexion, SLR, sit to stand no UE, calf stretch, heel raises, seated or supine hamstring stretch, standing hip abduction and extension    Consulted and Agree with Plan of Care  Patient       Patient will benefit from skilled therapeutic intervention in order to improve the following deficits and impairments:  Difficulty walking, Increased fascial restricitons, Decreased range of motion, Pain, Decreased skin integrity, Decreased balance, Decreased scar mobility, Hypomobility, Impaired flexibility, Decreased strength, Decreased mobility, Increased edema, Postural dysfunction  Visit Diagnosis: S/P right knee arthroscopy  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 Chronic systolic CHF (congestive heart failure) ([place]) 09/27/2019  . S/P right TK rev 08/22/2019  . Status post revision of total knee, right 06/15/2019  . 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 Acquired absence of knee joint  following removal of joint prosthesis with presence of antibiotic-impregnated cement spacer 03/28/2019  . High risk medication use 11/08/2018  . Infected right TKA 09/10/2018  . S/P left TKA 06/28/2018  . S/P TKR (total knee replacement), right 05/31/2018  . Osteoarthritis of right knee 05/09/2018  . Tubular adenoma of colon 10/18/2017  . 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 Diabetic nephropathy associated with type 2 diabetes mellitus ([place]) 10/13/2016  . Permanent atrial fibrillation ([place]) 10/13/2016  . Health examination of defined subpopulation 11/15/2015  . Polycythemia vera ([place]) 10/09/2014  . Hyperlipidemia associated with type 2 diabetes mellitus ([place]) 09/17/2014  . Hypertension associated with diabetes ([place]) 09/17/2014  . Diabetes mellitus ([place]) 10/31/2013  . Hx-TIA (transient ischemic attack) 09/05/2012  . GERD (gastroesophageal reflux disease) 08/17/2011  . Obesity (BMI 30-39.9) 08/17/2011  . Psoriatic arthritis ([place]) 08/17/2011  . Obstructive sleep apnea 08/05/2009    Hilda Blades, PT, DPT, LAT, ATC 10/10/19  12:00 PM Phone: [phone] Fax: [facility] [address] [place], [name], [address] Phone: [phone]   Fax:  [phone]  Name: Naseer Volo. MRN: [id number] Date of Birth: May 04, 1961

## 2019-10-12 ENCOUNTER — Ambulatory Visit: Payer: 59 | Admitting: Physical Therapy

## 2019-10-12 ENCOUNTER — Other Ambulatory Visit: Payer: Self-pay

## 2019-10-12 ENCOUNTER — Encounter: Payer: Self-pay | Admitting: Physical Therapy

## 2019-10-12 DIAGNOSIS — R262 Difficulty in walking, not elsewhere classified: Secondary | ICD-10-CM

## 2019-10-12 DIAGNOSIS — Z471 Aftercare following joint replacement surgery: Secondary | ICD-10-CM | POA: Diagnosis not present

## 2019-10-12 DIAGNOSIS — Z9889 Other specified postprocedural states: Secondary | ICD-10-CM | POA: Diagnosis not present

## 2019-10-12 DIAGNOSIS — Z96651 Presence of right artificial knee joint: Secondary | ICD-10-CM | POA: Diagnosis not present

## 2019-10-12 DIAGNOSIS — R600 Localized edema: Secondary | ICD-10-CM

## 2019-10-12 DIAGNOSIS — M6281 Muscle weakness (generalized): Secondary | ICD-10-CM | POA: Diagnosis not present

## 2019-10-12 NOTE — Therapy (Signed)
[place], [name], [address] Phone: [phone]   Fax:  [phone]  Physical Therapy Treatment  Patient Details  Name: [name]. MRN: [id number] Date of Birth: 10/05/1960 Referring Provider (PT): Dr. [name]   Encounter Date: 10/12/2019  PT End of Session - 10/12/19 0922    Visit Number  18    Number of Visits  24    Date for PT Re-Evaluation  10/23/19    Authorization Type  [facility]    PT Start Time  0915    PT Stop Time  1010    PT Time Calculation (min)  55 min    Activity Tolerance  Patient tolerated treatment well    Behavior During Therapy  [facility] for tasks assessed/performed       Past Medical History:  Diagnosis Date  . Arthritis   . Diabetes mellitus without complication ([place])    TYPE 2  . Diverticulosis   . Dyslipidemia   . GERD (gastroesophageal reflux disease)   . Herpes labialis   . Hypertension   . Longstanding persistent atrial fibrillation ([place])   . Metabolic syndrome   . Obesity   . Proteinuria   . Psoriasis   . Seborrheic dermatitis   . Sleep apnea    very compliant with CPAP, PT NEEDS TO BRING OWN MACHINE  . TIA (transient ischemic attack) 2009    Past Surgical History:  Procedure Laterality Date  . CARDIOVERSION N/A 12/02/2016   Procedure: CARDIOVERSION;  Surgeon: Pixie Casino, MD;  Location: [facility] ENDOSCOPY;  Service: Cardiovascular;  Laterality: N/A;  . COLONOSCOPY    . EXCISIONAL TOTAL KNEE ARTHROPLASTY WITH ANTIBIOTIC SPACERS Right 03/28/2019   Procedure: EXCISIONAL TOTAL KNEE ARTHROPLASTY WITH ANTIBIOTIC SPACERS;  Surgeon: [name], MD;  Location: [facility];  Service: Orthopedics;  Laterality: Right;  90 mins  . I & D KNEE WITH POLY EXCHANGE Right 09/10/2018   Procedure: Right Knee Arthroplasty IRRIGATION AND DEBRIDEMENT KNEE WITH POLY EXCHANGE;  Surgeon: [name], MD;  Location: [facility];  Service: Orthopedics;  Laterality: Right;  . JOINT  REPLACEMENT  11/03/10   LT HIP  . PFO occluder cardiac valve  2006   Dr. Einar Gip, (hole in heart)  . REIMPLANTATION OF TOTAL KNEE Right 06/15/2019   Procedure: Resection of antibiotic spacer and  irrigation and debridment and placement of new antibiotic spacer and components;  Surgeon: [name], MD;  Location: [facility];  Service: Orthopedics;  Laterality: Right;  90 mins  . REIMPLANTATION OF TOTAL KNEE Right 08/22/2019   Procedure: REIMPLANTATION OF TOTAL KNEE;  Surgeon: [name], MD;  Location: [facility];  Service: Orthopedics;  Laterality: Right;  120 mins  . TONSILLECTOMY AND ADENOIDECTOMY    . TOTAL HIP ARTHROPLASTY Left   . TOTAL KNEE ARTHROPLASTY Right 05/31/2018   Procedure: RIGHT TOTAL KNEE ARTHROPLASTY;  Surgeon: [name], MD;  Location: [facility];  Service: Orthopedics;  Laterality: Right;  70 mins  . TOTAL KNEE ARTHROPLASTY Left 06/28/2018   Procedure: LEFT TOTAL KNEE ARTHROPLASTY;  Surgeon: [name], MD;  Location: [facility];  Service: Orthopedics;  Laterality: Left;  70 mins  . WISDOM TOOTH EXTRACTION      There were no vitals filed for this visit.  Subjective Assessment - 10/12/19 0917    Subjective  Patient reports he is doing well. He has noticed more fluid around the knee and it is like a balloon.    Patient Stated Goals  Pt would like to be able to return to yardwork, tennis    Currently in Pain?  Yes    Pain Score  2     Pain Location  Knee    Pain Orientation  Right    Pain Descriptors / Indicators  Sore    Pain Type  Surgical pain    Pain Onset  More than a month ago    Pain Frequency  Constant                       [facility] Adult PT Treatment/Exercise - 10/12/19 0001      Exercises   Exercises  Knee/Hip      Knee/Hip Exercises: Stretches   Passive Hamstring Stretch  30 seconds;3 reps    Passive Hamstring Stretch Limitations  supine    Hip Flexor Stretch  3 reps;30 seconds    Hip Flexor Stretch Limitations  supine edge if table     Gastroc Stretch  3 reps;20 seconds    Gastroc Stretch Limitations  slant board      Knee/Hip Exercises: Aerobic   Nustep  6 min LE only L5      Knee/Hip Exercises: Standing   Heel Raises  20 reps   2 sets     Knee/Hip Exercises: Seated   Hamstring Limitations  3x15 with red band    Sit to [place]  3 sets;10 reps      Knee/Hip Exercises: Supine   Bridges  3 sets;10 reps    Bridges Limitations  bolster under legs due to limited knee flexion      Knee/Hip Exercises: Sidelying   Hip ABduction  3 sets;10 reps      Modalities   Modalities  Vasopneumatic      Vasopneumatic   Number Minutes Vasopneumatic   15 minutes    Vasopnuematic Location   Knee    Vasopneumatic Pressure  High    Vasopneumatic Temperature   34             PT Education - 10/12/19 0919    Education Details  HEP, swelling control    Person(s) Educated  Patient    Methods  Explanation;Demonstration;Verbal cues    Comprehension  Verbalized understanding;Returned demonstration;Verbal cues required;Need further instruction       PT Short Term Goals - 09/21/19 1012      PT SHORT TERM GOAL #1   Title  Pt will be I with initial HEP for Rt knee ROM and strength, balance    Time  4    Period  Weeks    Status  On-going    Target Date  09/25/19      PT SHORT TERM GOAL #2   Title  Pt will be able to walk with/without least restricted assistive device in clinic with min noticeable limp, improved heel strike.    Time  4    Period  Weeks    Status  Achieved    Target Date  09/25/19      PT SHORT TERM GOAL #3   Title  Pt will be able to sit with knee flexed to 95 deg for improved sit to stand symmetry and ease    Time  6    Period  Weeks    Status  Achieved    Target Date  09/25/19      PT SHORT TERM GOAL #4   Title  Pt will be able to demo passive knee extension to no  more than 10 deg. to work towards near full extension and normal knee function.    Time  4    Period  Weeks    Status  Achieved     Target Date  09/25/19      PT SHORT TERM GOAL #5   Title  Pt will understand RICE in order to minimize pain and swelling in RT LE    Time  8    Period  Weeks    Status  Achieved    Target Date  09/25/19        PT Long Term Goals - 08/28/19 1949      PT LONG TERM GOAL #1   Title  Pt will improve FOTO score to no more than 49 % limited to demo improved functional mobility.    Baseline  64%    Time  8    Period  Weeks    Status  New    Target Date  10/23/19      PT LONG TERM GOAL #2   Title  Pt will be able to perform light recreational activites in the home and ADLs  with no more than min occasional difficulty    Time  8    Period  Weeks    Status  New    Target Date  10/23/19      PT LONG TERM GOAL #3   Title  Pt will be able to go up and down 12 stairs in his home with min difficulty, reciprocal and using 1 rail.    Time  8    Period  Weeks    Status  New    Target Date  10/23/19      PT LONG TERM GOAL #4   Title  Pt will demo 5/5 knee strength as well as hip abduction for normal, safe gait mechanics    Time  8    Period  Weeks    Status  New    Target Date  10/23/19      PT LONG TERM GOAL #5   Title  Pt will be able to extend knee AROM to lacking no more than 10 deg.    Time  8    Period  Weeks    Status  New    Target Date  10/23/19      Additional Long Term Goals   Additional Long Term Goals  Yes      PT LONG TERM GOAL #6   Title  Pt will be I with final HEP upong discharge for long term mobility and health.    Time  8    Period  Weeks    Status  New    Target Date  10/23/19            Plan - 10/12/19 [id number]    Clinical Impression Statement  Patient did exhibit increased swelling this visit of the right knee so therapy focused on strengthening of the core, hips, and knee without stressing the knee to avoid further swelling. He did exhibit improvement following gameready. He is progressing well with his strengthening but does continue to have  limitation in range of motion. He would benefit from continued skilled PT to progress his motion and strength to improve walking ability and return to PLOF.    PT Treatment/Interventions  ADLs/Self Care Home Management;Stair training;Functional mobility training;Cryotherapy;Neuromuscular re-education;Balance training;Therapeutic exercise;Therapeutic activities;Manual lymph drainage;Taping;Vasopneumatic Device;Manual techniques;Passive range of motion;Patient/family education;DME Instruction;Gait training;Electrical Stimulation    PT  Next Visit Plan  Assess HEP and progress PRN, ROM and strength as tol, recumbant, edema mgmt    PT Home Exercise Plan  QS, towel slide flexion, SLR, sit to stand no UE, calf stretch, heel raises, seated or supine hamstring stretch, standing hip abduction and extension, seated hamstring curl    Consulted and Agree with Plan of Care  Patient       Patient will benefit from skilled therapeutic intervention in order to improve the following deficits and impairments:  Difficulty walking, Increased fascial restricitons, Decreased range of motion, Pain, Decreased skin integrity, Decreased balance, Decreased scar mobility, Hypomobility, Impaired flexibility, Decreased strength, Decreased mobility, Increased edema, Postural dysfunction  Visit Diagnosis: S/P right knee arthroscopy  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 Chronic systolic CHF (congestive heart failure) ([place]) 09/27/2019  . S/P right TK rev 08/22/2019  . Status post revision of total knee, right 06/15/2019  . 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 Acquired absence of knee joint following removal of joint  prosthesis with presence of antibiotic-impregnated cement spacer 03/28/2019  . High risk medication use 11/08/2018  . Infected right TKA 09/10/2018  . S/P left TKA 06/28/2018  . S/P TKR (total knee replacement), right 05/31/2018  . Osteoarthritis of right knee 05/09/2018  . Tubular adenoma of colon 10/18/2017  . 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 Diabetic nephropathy associated with type 2 diabetes mellitus ([place]) 10/13/2016  . Permanent atrial fibrillation ([place]) 10/13/2016  . Health examination of defined subpopulation 11/15/2015  . Polycythemia vera ([place]) 10/09/2014  . Hyperlipidemia associated with type 2 diabetes mellitus ([place]) 09/17/2014  . Hypertension associated with diabetes ([place]) 09/17/2014  . Diabetes mellitus ([place]) 10/31/2013  . Hx-TIA (transient ischemic attack) 09/05/2012  . GERD (gastroesophageal reflux disease) 08/17/2011  . Obesity (BMI 30-39.9) 08/17/2011  . Psoriatic arthritis ([place]) 08/17/2011  . Obstructive sleep apnea 08/05/2009    Hilda Blades, PT, DPT, LAT, ATC 10/12/19  10:43 AM Phone: [phone] Fax: [facility] [address] [place], [name], [address] Phone: [phone]   Fax:  [phone]  Name: Jozeph Romer. MRN: [id number] Date of Birth: 05-Jun-1961

## 2019-10-16 ENCOUNTER — Other Ambulatory Visit: Payer: Self-pay | Admitting: Family Medicine

## 2019-10-16 DIAGNOSIS — E1121 Type 2 diabetes mellitus with diabetic nephropathy: Secondary | ICD-10-CM

## 2019-10-16 MED FILL — JARDIANCE 10 MG TABLET: 10 | 30 days supply | Qty: 30 | Fill #0

## 2019-10-16 MED FILL — ENTRESTO 24 MG-26 MG TABLET: 24-26 | 30 days supply | Qty: 60 | Fill #1

## 2019-10-16 MED FILL — DOXYCYCLINE HYC 100 MG CAPS: 100 | 30 days supply | Qty: 60 | Fill #2

## 2019-10-18 ENCOUNTER — Encounter: Payer: Self-pay | Admitting: Physical Therapy

## 2019-10-18 ENCOUNTER — Other Ambulatory Visit: Payer: Self-pay

## 2019-10-18 ENCOUNTER — Ambulatory Visit: Payer: 59 | Admitting: Physical Therapy

## 2019-10-18 DIAGNOSIS — Z9889 Other specified postprocedural states: Secondary | ICD-10-CM

## 2019-10-18 DIAGNOSIS — R262 Difficulty in walking, not elsewhere classified: Secondary | ICD-10-CM | POA: Diagnosis not present

## 2019-10-18 DIAGNOSIS — R600 Localized edema: Secondary | ICD-10-CM

## 2019-10-18 DIAGNOSIS — M6281 Muscle weakness (generalized): Secondary | ICD-10-CM | POA: Diagnosis not present

## 2019-10-18 DIAGNOSIS — Z96651 Presence of right artificial knee joint: Secondary | ICD-10-CM | POA: Diagnosis not present

## 2019-10-18 NOTE — Therapy (Signed)
[place], [name], [address] Phone: [phone]   Fax:  [phone]  Physical Therapy Treatment  Patient Details   Name: [name]. MRN: [id number] Date of Birth: [birth year] Referring Provider (PT): Dr. [name]   Encounter Date: 10/18/2019  PT End of Session - 10/18/19 1335    Visit Number  19    Number of Visits  24    Date for PT Re-Evaluation  10/23/19    Authorization Type  [facility] UMR    PT Start Time  1215    PT Stop Time  1315    PT Time Calculation (min)  60 min    Activity Tolerance  Patient tolerated treatment well    Behavior During Therapy  [facility] for tasks assessed/performed       Past Medical History:  Diagnosis Date  . Arthritis   . Diabetes mellitus without complication ([place])    TYPE 2  . Diverticulosis   . Dyslipidemia   . GERD (gastroesophageal reflux disease)   . Herpes labialis   . Hypertension   . Longstanding persistent atrial fibrillation ([place])   . Metabolic syndrome   . Obesity   . Proteinuria   . Psoriasis   . Seborrheic dermatitis   . Sleep apnea    very compliant with CPAP, PT NEEDS TO BRING OWN MACHINE  . TIA (transient ischemic attack) 2009    Past Surgical History:  Procedure Laterality Date  . CARDIOVERSION N/A 12/02/2016   Procedure: CARDIOVERSION;  Surgeon: Pixie Casino, MD;  Location: [facility] ENDOSCOPY;  Service: Cardiovascular;  Laterality: N/A;  . COLONOSCOPY    . EXCISIONAL TOTAL KNEE ARTHROPLASTY WITH ANTIBIOTIC SPACERS Right 03/28/2019   Procedure: EXCISIONAL TOTAL KNEE ARTHROPLASTY WITH ANTIBIOTIC SPACERS;  Surgeon: [name], MD;  Location: [facility] ORS;  Service: Orthopedics;  Laterality: Right;  90 mins  . I & D KNEE WITH POLY EXCHANGE Right 09/10/2018   Procedure: Right Knee Arthroplasty IRRIGATION AND DEBRIDEMENT KNEE WITH POLY EXCHANGE;  Surgeon: [name], MD;  Location: [facility] ORS;  Service: Orthopedics;  Laterality: Right;  . JOINT  REPLACEMENT  11/03/10   LT HIP  . PFO occluder cardiac valve  2006   Dr. Einar Gip, (hole in heart)  . REIMPLANTATION OF TOTAL KNEE Right 06/15/2019   Procedure: Resection of antibiotic spacer and  irrigation and debridment and placement of new antibiotic spacer and components;  Surgeon: [name], MD;  Location: [facility];  Service: Orthopedics;  Laterality: Right;  90 mins  . REIMPLANTATION OF TOTAL KNEE Right 08/22/2019   Procedure: REIMPLANTATION OF TOTAL KNEE;  Surgeon: [name], MD;  Location: [facility];  Service: Orthopedics;  Laterality: Right;  120 mins  . TONSILLECTOMY AND ADENOIDECTOMY    . TOTAL HIP ARTHROPLASTY Left   . TOTAL KNEE ARTHROPLASTY Right 05/31/2018   Procedure: RIGHT TOTAL KNEE ARTHROPLASTY;  Surgeon: [name], MD;  Location: [facility];  Service: Orthopedics;  Laterality: Right;  70 mins  . TOTAL KNEE ARTHROPLASTY Left 06/28/2018   Procedure: LEFT TOTAL KNEE ARTHROPLASTY;  Surgeon: [name], MD;  Location: [facility];  Service: Orthopedics;  Laterality: Left;  70 mins  . WISDOM TOOTH EXTRACTION      There were no vitals filed for this visit.  Subjective Assessment - 10/18/19 1333    Subjective  Patient reports he is doing well. He had fluid taken off the knee last Friday but it is still puffy.    Patient Stated Goals  Pt would like to be able to return to yardwork, tennis    Currently in Pain?  Yes    Pain Score  2     Pain Location  Knee    Pain Orientation  Right    Pain Descriptors / Indicators  Sore    Pain Type  Surgical pain    Pain Onset  More than a month ago    Pain Frequency  Constant                       [facility] Adult PT Treatment/Exercise - 10/18/19 0001      Exercises   Exercises  Knee/Hip      Knee/Hip Exercises: Stretches   Passive Hamstring Stretch  2 reps;30 seconds    Passive Hamstring Stretch Limitations  supine    Gastroc Stretch  3 reps;20 seconds    Gastroc Stretch Limitations  slant board      Knee/Hip Exercises:  Aerobic   Nustep  6 min LE only L5      Knee/Hip Exercises: Standing   Heel Raises  20 reps    Forward Step Up  10 reps;Step Height: 8"    Step Down  10 reps;Step Height: 4"      Knee/Hip Exercises: Seated   Long Arc Quad  20 reps    Long Arc Quad Weight  3 lbs.    Hamstring Limitations  3x15 with red band    Sit to Sand  3 sets;10 reps      Modalities   Modalities  Vasopneumatic      Vasopneumatic   Number Minutes Vasopneumatic   15 minutes    Vasopnuematic Location   Knee    Vasopneumatic Pressure  High    Vasopneumatic Temperature   34      Manual Therapy   Manual Therapy  Joint mobilization;Passive ROM    Manual therapy comments  Focused on improving knee motion    Joint Mobilization  AP and rotational tibiofemoral joint mobs in supine and seated, PF joint mobs in all directions    Passive ROM  Knee flexion and extension in supine and stretching in seated             PT Education - 10/18/19 1335    Person(s) Educated  Patient    Methods  Explanation;Demonstration;Verbal cues    Comprehension  Verbalized understanding;Returned demonstration;Verbal cues required;Need further instruction       PT Short Term Goals - 09/21/19 1012      PT SHORT TERM GOAL #1   Title  Pt will be I with initial HEP for Rt knee ROM and strength, balance    Time  4    Period  Weeks    Status  On-going    Target Date  09/25/19      PT SHORT TERM GOAL #2   Title  Pt will be able to walk with/without least restricted assistive device in clinic with min noticeable limp, improved heel strike.    Time  4    Period  Weeks    Status  Achieved    Target Date  09/25/19      PT SHORT TERM GOAL #3   Title  Pt will be able to sit with knee flexed to 95 deg for improved sit to stand symmetry and ease    Time  6    Period  Weeks    Status  Achieved    Target Date  09/25/19      PT SHORT TERM GOAL #4   Title  Pt will be able to demo passive knee extension to no more than 10 deg. to work  towards near full extension and normal knee function.    Time  4    Period  Weeks    Status  Achieved    Target Date  09/25/19      PT SHORT TERM GOAL #5   Title  Pt will understand RICE in order to minimize pain and swelling in RT LE    Time  8    Period  Weeks    Status  Achieved    Target Date  09/25/19        PT Long Term Goals - 08/28/19 1949      PT LONG TERM GOAL #1   Title  Pt will improve FOTO score to no more than 49 % limited to demo improved functional mobility.    Baseline  64%    Time  8    Period  Weeks    Status  New    Target Date  10/23/19      PT LONG TERM GOAL #2   Title  Pt will be able to perform light recreational activites in the home and ADLs  with no more than min occasional difficulty    Time  8    Period  Weeks    Status  New    Target Date  10/23/19      PT LONG TERM GOAL #3   Title  Pt will be able to go up and down 12 stairs in his home with min difficulty, reciprocal and using 1 rail.    Time  8    Period  Weeks    Status  New    Target Date  10/23/19      PT LONG TERM GOAL #4   Title  Pt will demo 5/5 knee strength as well as hip abduction for normal, safe gait mechanics    Time  8    Period  Weeks    Status  New    Target Date  10/23/19      PT LONG TERM GOAL #5   Title  Pt will be able to extend knee AROM to lacking no more than 10 deg.    Time  8    Period  Weeks    Status  New    Target Date  10/23/19      Additional Long Term Goals   Additional Long Term Goals  Yes      PT LONG TERM GOAL #6   Title  Pt will be I with final HEP upong discharge for long term mobility and health.    Time  8    Period  Weeks    Status  New    Target Date  10/23/19            Plan - 10/18/19 1336    Clinical Impression Statement  Patient is progressing well and tolerated therapy well. He did exhibit swelling this visit that is most likely contributing to limitation in motion and strength. He had a difficult time with forward  step-down due to anterior knee pain and weakness. He is tolerating progressions in strengthening well without increased pain level but does have greater difficulty on right. He would benefit from continued skilled PT to progress his motion and strngh to improve walking and tolerance for being on his  feet.    PT Treatment/Interventions  ADLs/Self Care Home Management;Stair training;Functional mobility training;Cryotherapy;Neuromuscular re-education;Balance training;Therapeutic exercise;Therapeutic activities;Manual lymph drainage;Taping;Vasopneumatic Device;Manual techniques;Passive range of motion;Patient/family education;DME Instruction;Gait training;Electrical Stimulation    PT Next Visit Plan  Assess HEP and progress PRN, ROM and strength as tol, recumbant, edema mgmt    PT Home Exercise Plan  QS, towel slide flexion, SLR, sit to stand no UE, calf stretch, heel raises, seated or supine hamstring stretch, standing hip abduction and extension, seated hamstring curl    Consulted and Agree with Plan of Care  Patient       Patient will benefit from skilled therapeutic intervention in order to improve the following deficits and impairments:  Difficulty walking, Increased fascial restricitons, Decreased range of motion, Pain, Decreased skin integrity, Decreased balance, Decreased scar mobility, Hypomobility, Impaired flexibility, Decreased strength, Decreased mobility, Increased edema, Postural dysfunction  Visit Diagnosis: S/P right knee arthroscopy  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 Chronic systolic CHF (congestive heart failure) ([place]) 09/27/2019  . S/P right TK rev 08/22/2019  . Status post revision of total knee, right 06/15/2019  . 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 High risk medication use 11/08/2018  . Infected right TKA 09/10/2018  . S/P left TKA 06/28/2018  . S/P TKR (total knee replacement), right 05/31/2018  . Osteoarthritis of right knee 05/09/2018  . Tubular adenoma of colon 10/18/2017  . 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 Diabetic nephropathy associated with type 2 diabetes mellitus ([place]) 10/13/2016  . Permanent atrial fibrillation ([place]) 10/13/2016  . Health examination of defined subpopulation 11/15/2015  . Polycythemia vera ([place]) 10/09/2014  . Hyperlipidemia associated with type 2 diabetes mellitus ([place]) 09/17/2014  . Hypertension associated with diabetes ([place]) 09/17/2014  . Diabetes mellitus ([place]) 10/31/2013  . Hx-TIA (transient ischemic attack) 09/05/2012  . GERD (gastroesophageal reflux disease) 08/17/2011  . Obesity (BMI 30-39.9) 08/17/2011  . Psoriatic arthritis ([place]) 08/17/2011  . Obstructive sleep apnea 08/05/2009    Hilda Blades, PT, DPT, LAT, ATC 10/18/19  1:50 PM Phone: [phone] Fax: [place] [facility] [address] [place], [name], [address] Phone: [phone]   Fax:  [phone]  Name: [name]. MRN: [id number] Date of Birth: 05-09-1961

## 2019-10-19 ENCOUNTER — Encounter: Payer: 59 | Admitting: Medical

## 2019-10-20 ENCOUNTER — Other Ambulatory Visit: Payer: Self-pay

## 2019-10-20 ENCOUNTER — Encounter: Payer: Self-pay | Admitting: Physical Therapy

## 2019-10-20 ENCOUNTER — Ambulatory Visit: Payer: 59 | Admitting: Physical Therapy

## 2019-10-20 DIAGNOSIS — Z96651 Presence of right artificial knee joint: Secondary | ICD-10-CM

## 2019-10-20 DIAGNOSIS — Z9889 Other specified postprocedural states: Secondary | ICD-10-CM

## 2019-10-20 DIAGNOSIS — M6281 Muscle weakness (generalized): Secondary | ICD-10-CM | POA: Diagnosis not present

## 2019-10-20 DIAGNOSIS — R262 Difficulty in walking, not elsewhere classified: Secondary | ICD-10-CM

## 2019-10-20 DIAGNOSIS — R600 Localized edema: Secondary | ICD-10-CM

## 2019-10-20 NOTE — Therapy (Signed)
[place], [name], [address] Phone: [phone]   Fax:  [phone]  Physical Therapy Treatment  Patient Details  Name: [name]. MRN: [id number] Date of Birth: 04/06/1961 Referring Provider (PT): Dr. [name]   Encounter Date: 10/20/2019  PT End of Session - 10/20/19 0917    Visit Number  20    Number of Visits  24    Date for PT Re-Evaluation  10/23/19    Authorization Type  [facility] UMR    PT Start Time  0914    PT Stop Time  1010    PT Time Calculation (min)  56 min    Activity Tolerance  Patient tolerated treatment well    Behavior During Therapy  [facility] for tasks assessed/performed       Past Medical History:  Diagnosis Date  . Arthritis   . Diabetes mellitus without complication ([place])    TYPE 2  . Diverticulosis   . Dyslipidemia   . GERD (gastroesophageal reflux disease)   . Herpes labialis   . Hypertension   . Longstanding persistent atrial fibrillation ([place])   . Metabolic syndrome   . Obesity   . Proteinuria   . Psoriasis   . Seborrheic dermatitis   . Sleep apnea    very compliant with CPAP, PT NEEDS TO BRING OWN MACHINE  . TIA (transient ischemic attack) 2009    Past Surgical History:  Procedure Laterality Date  . CARDIOVERSION N/A 12/02/2016   Procedure: CARDIOVERSION;  Surgeon: Pixie Casino, MD;  Location: [facility] ENDOSCOPY;  Service: Cardiovascular;  Laterality: N/A;  . COLONOSCOPY    . EXCISIONAL TOTAL KNEE ARTHROPLASTY WITH ANTIBIOTIC SPACERS Right 03/28/2019   Procedure: EXCISIONAL TOTAL KNEE ARTHROPLASTY WITH ANTIBIOTIC SPACERS;  Surgeon: [name], MD;  Location: [facility];  Service: Orthopedics;  Laterality: Right;  90 mins  . I & D KNEE WITH POLY EXCHANGE Right 09/10/2018   Procedure: Right Knee Arthroplasty IRRIGATION AND DEBRIDEMENT KNEE WITH POLY EXCHANGE;  Surgeon: [name], MD;  Location: [facility];  Service: Orthopedics;  Laterality: Right;  . JOINT  REPLACEMENT  11/03/10   LT HIP  . PFO occluder cardiac valve  2006   Dr. Einar Gip, (hole in heart)  . REIMPLANTATION OF TOTAL KNEE Right 06/15/2019   Procedure: Resection of antibiotic spacer and  irrigation and debridment and placement of new antibiotic spacer and components;  Surgeon: [name], MD;  Location: [facility];  Service: Orthopedics;  Laterality: Right;  90 mins  . REIMPLANTATION OF TOTAL KNEE Right 08/22/2019   Procedure: REIMPLANTATION OF TOTAL KNEE;  Surgeon: [name], MD;  Location: [facility];  Service: Orthopedics;  Laterality: Right;  120 mins  . TONSILLECTOMY AND ADENOIDECTOMY    . TOTAL HIP ARTHROPLASTY Left   . TOTAL KNEE ARTHROPLASTY Right 05/31/2018   Procedure: RIGHT TOTAL KNEE ARTHROPLASTY;  Surgeon: [name], MD;  Location: [facility];  Service: Orthopedics;  Laterality: Right;  70 mins  . TOTAL KNEE ARTHROPLASTY Left 06/28/2018   Procedure: LEFT TOTAL KNEE ARTHROPLASTY;  Surgeon: [name], MD;  Location: [facility];  Service: Orthopedics;  Laterality: Left;  70 mins  . WISDOM TOOTH EXTRACTION      There were no vitals filed for this visit.  Subjective Assessment - 10/20/19 0915    Subjective  Patient reports he is doing well. No new issues.    Patient Stated Goals  Pt would like to be able to return to yardwork, tennis  Currently in Pain?  Yes    Pain Score  1     Pain Location  Knee    Pain Orientation  Right    Pain Descriptors / Indicators  Sore    Pain Type  Surgical pain    Pain Onset  More than a month ago    Pain Frequency  Constant         [facility] PT Assessment - 10/20/19 0001      Assessment   Medical Diagnosis  Rt TKA     Referring Provider (PT)  Dr. [name]    Onset Date/Surgical Date  08/22/19                   [facility] Adult PT Treatment/Exercise - 10/20/19 0001      Exercises   Exercises  Knee/Hip      Knee/Hip Exercises: Stretches   Passive Hamstring Stretch  2 reps;30 seconds    Passive Hamstring Stretch  Limitations  seated    Quad Stretch  2 reps;30 seconds    Quad Stretch Limitations  prone    Gastroc Stretch  2 reps;30 seconds    Gastroc Stretch Limitations  slant board      Knee/Hip Exercises: Aerobic   Recumbent Bike  L2 x 6 min to improve knee flexion      Knee/Hip Exercises: Machines for Strengthening   Cybex Knee Extension  15# x20, 20# 2x20    Cybex Knee Flexion  35# x20, 45# 2x20    Cybex Leg Press  55# x15, 75# x20, 85# 2x20      Knee/Hip Exercises: Standing   Heel Raises  2 sets;20 reps    Hip Abduction  2 sets;15 reps    Abduction Limitations  yellow band around ankles    Hip Extension  2 sets;15 reps    Extension Limitations  yellow band around ankles      Modalities   Modalities  Vasopneumatic      Vasopneumatic   Number Minutes Vasopneumatic   15 minutes    Vasopnuematic Location   Knee    Vasopneumatic Pressure  High    Vasopneumatic Temperature   34             PT Education - 10/20/19 0916    Education Details  HEP    Person(s) Educated  Patient    Methods  Explanation;Demonstration;Verbal cues;Handout    Comprehension  Verbalized understanding;Returned demonstration;Verbal cues required;Need further instruction       PT Short Term Goals - 09/21/19 1012      PT SHORT TERM GOAL #1   Title  Pt will be I with initial HEP for Rt knee ROM and strength, balance    Time  4    Period  Weeks    Status  On-going    Target Date  09/25/19      PT SHORT TERM GOAL #2   Title  Pt will be able to walk with/without least restricted assistive device in clinic with min noticeable limp, improved heel strike.    Time  4    Period  Weeks    Status  Achieved    Target Date  09/25/19      PT SHORT TERM GOAL #3   Title  Pt will be able to sit with knee flexed to 95 deg for improved sit to stand symmetry and ease    Time  6    Period  Weeks    Status  Achieved    Target Date  09/25/19      PT SHORT TERM GOAL #4   Title  Pt will be able to demo passive  knee extension to no more than 10 deg. to work towards near full extension and normal knee function.    Time  4    Period  Weeks    Status  Achieved    Target Date  09/25/19      PT SHORT TERM GOAL #5   Title  Pt will understand RICE in order to minimize pain and swelling in RT LE    Time  8    Period  Weeks    Status  Achieved    Target Date  09/25/19        PT Long Term Goals - 08/28/19 1949      PT LONG TERM GOAL #1   Title  Pt will improve FOTO score to no more than 49 % limited to demo improved functional mobility.    Baseline  64%    Time  8    Period  Weeks    Status  New    Target Date  10/23/19      PT LONG TERM GOAL #2   Title  Pt will be able to perform light recreational activites in the home and ADLs  with no more than min occasional difficulty    Time  8    Period  Weeks    Status  New    Target Date  10/23/19      PT LONG TERM GOAL #3   Title  Pt will be able to go up and down 12 stairs in his home with min difficulty, reciprocal and using 1 rail.    Time  8    Period  Weeks    Status  New    Target Date  10/23/19      PT LONG TERM GOAL #4   Title  Pt will demo 5/5 knee strength as well as hip abduction for normal, safe gait mechanics    Time  8    Period  Weeks    Status  New    Target Date  10/23/19      PT LONG TERM GOAL #5   Title  Pt will be able to extend knee AROM to lacking no more than 10 deg.    Time  8    Period  Weeks    Status  New    Target Date  10/23/19      Additional Long Term Goals   Additional Long Term Goals  Yes      PT LONG TERM GOAL #6   Title  Pt will be I with final HEP upong discharge for long term mobility and health.    Time  8    Period  Weeks    Status  New    Target Date  10/23/19            Plan - 10/20/19 [id number]    Clinical Impression Statement  Patient continues to progress well with his strengthening exercises and tolerance for standing/walking activities. He continues to exhibit limitation in  range of motion and increased swelling of the right knee. He would benefit from continued skilled PT to progress his range of motion and strength to allow for improved tolerance to activity and walking without limitation.    PT Treatment/Interventions  ADLs/Self Care Home Management;Stair training;Functional mobility training;Cryotherapy;Neuromuscular re-education;Balance training;Therapeutic exercise;Therapeutic activities;Manual  lymph drainage;Taping;Vasopneumatic Device;Manual techniques;Passive range of motion;Patient/family education;DME Instruction;Gait training;Electrical Stimulation    PT Next Visit Plan  Assess HEP and progress PRN, ROM and strength as tol, recumbant, edema mgmt    PT Home Exercise Plan  QS, towel slide flexion, SLR, sit to stand no UE, calf stretch, heel raises, seated or supine hamstring stretch, standing hip abduction and extension, seated hamstring curl    Consulted and Agree with Plan of Care  Patient       Patient will benefit from skilled therapeutic intervention in order to improve the following deficits and impairments:  Difficulty walking, Increased fascial restricitons, Decreased range of motion, Pain, Decreased skin integrity, Decreased balance, Decreased scar mobility, Hypomobility, Impaired flexibility, Decreased strength, Decreased mobility, Increased edema, Postural dysfunction  Visit Diagnosis: S/P right knee arthroscopy  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 Chronic systolic CHF (congestive heart failure) ([place]) 09/27/2019  . S/P right TK rev 08/22/2019  . Status post revision of total knee, right 06/15/2019  . 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 High risk medication use 11/08/2018  . Infected right TKA 09/10/2018  . S/P left TKA 06/28/2018  . S/P TKR (total knee replacement), right 05/31/2018  . Osteoarthritis of right knee 05/09/2018  . Tubular adenoma of colon 10/18/2017  . 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 Diabetic nephropathy associated with type 2 diabetes mellitus ([place]) 10/13/2016  . Permanent atrial fibrillation ([place]) 10/13/2016  . Health examination of defined subpopulation 11/15/2015  . Polycythemia vera ([place]) 10/09/2014  . Hyperlipidemia associated with type 2 diabetes mellitus ([place]) 09/17/2014  . Hypertension associated with diabetes ([place]) 09/17/2014  . Diabetes mellitus ([place]) 10/31/2013  . Hx-TIA (transient ischemic attack) 09/05/2012  . GERD (gastroesophageal reflux disease) 08/17/2011  . Obesity (BMI 30-39.9) 08/17/2011  . Psoriatic arthritis ([place]) 08/17/2011  . Obstructive sleep apnea 08/05/2009    Hilda Blades, PT, DPT, LAT, ATC 10/20/19  10:24 AM Phone: [phone] Fax: [facility] [address] [place], [name], [address] Phone: [phone]   Fax:  [phone]  Name: Edilson Gan. MRN: [id number] Date of Birth: 1961-05-11

## 2019-10-23 ENCOUNTER — Other Ambulatory Visit: Payer: Self-pay

## 2019-10-23 ENCOUNTER — Ambulatory Visit: Payer: 59 | Admitting: Physical Therapy

## 2019-10-23 ENCOUNTER — Encounter: Payer: Self-pay | Admitting: Medical

## 2019-10-23 ENCOUNTER — Ambulatory Visit: Payer: Self-pay | Admitting: Medical

## 2019-10-23 ENCOUNTER — Encounter: Payer: Self-pay | Admitting: Physical Therapy

## 2019-10-23 VITALS — BP 118/80 | HR 79 | Temp 97.8°F | Ht 69.0 in | Wt 281.4 lb

## 2019-10-23 DIAGNOSIS — I5022 Chronic systolic (congestive) heart failure: Secondary | ICD-10-CM

## 2019-10-23 DIAGNOSIS — Z9889 Other specified postprocedural states: Secondary | ICD-10-CM

## 2019-10-23 DIAGNOSIS — R600 Localized edema: Secondary | ICD-10-CM | POA: Diagnosis not present

## 2019-10-23 DIAGNOSIS — Z96651 Presence of right artificial knee joint: Secondary | ICD-10-CM

## 2019-10-23 DIAGNOSIS — I152 Hypertension secondary to endocrine disorders: Secondary | ICD-10-CM

## 2019-10-23 DIAGNOSIS — Z8673 Personal history of transient ischemic attack (TIA), and cerebral infarction without residual deficits: Secondary | ICD-10-CM

## 2019-10-23 DIAGNOSIS — M6281 Muscle weakness (generalized): Secondary | ICD-10-CM

## 2019-10-23 DIAGNOSIS — E1121 Type 2 diabetes mellitus with diabetic nephropathy: Secondary | ICD-10-CM

## 2019-10-23 DIAGNOSIS — I4821 Permanent atrial fibrillation: Secondary | ICD-10-CM

## 2019-10-23 DIAGNOSIS — R262 Difficulty in walking, not elsewhere classified: Secondary | ICD-10-CM | POA: Diagnosis not present

## 2019-10-23 DIAGNOSIS — Z79899 Other long term (current) drug therapy: Secondary | ICD-10-CM

## 2019-10-23 DIAGNOSIS — G4733 Obstructive sleep apnea (adult) (pediatric): Secondary | ICD-10-CM

## 2019-10-23 DIAGNOSIS — Z0289 Encounter for other administrative examinations: Secondary | ICD-10-CM

## 2019-10-23 DIAGNOSIS — I10 Essential (primary) hypertension: Secondary | ICD-10-CM

## 2019-10-23 DIAGNOSIS — E1159 Type 2 diabetes mellitus with other circulatory complications: Secondary | ICD-10-CM

## 2019-10-23 LAB — POCT URINALYSIS DIP (PROADVANTAGE DEVICE)
Bilirubin, UA: NEGATIVE
Blood, UA: NEGATIVE
Glucose, UA: 100 mg/dL — AB
Ketones, POC UA: NEGATIVE mg/dL
Leukocytes, UA: NEGATIVE
Nitrite, UA: NEGATIVE
Protein Ur, POC: 30 mg/dL — AB
Specific Gravity, Urine: 1.015
Urobilinogen, Ur: NEGATIVE
pH, UA: 6 (ref 5.0–8.0)

## 2019-10-23 NOTE — Patient Instructions (Signed)
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  Please email the "Medical Certificate" page to [email or web address]

## 2019-10-23 NOTE — Therapy (Signed)
[place], [name], [address] Phone: [phone]   Fax:  [phone]  Physical Therapy Treatment  Progress Note Reporting Period 08/28/2019 to 10/23/2019  See note below for Objective Data and Assessment of Progress/Goals.     Patient Details  Name: [name]. MRN: [id number] Date of Birth: 1961-08-21 Referring Provider (PT): Dr. [name]   Encounter Date: 10/23/2019  PT End of Session - 10/23/19 0927    Visit Number  21    Number of Visits  24    Date for PT Re-Evaluation  10/23/19    Authorization Type  [facility]    PT Start Time  0915    PT Stop Time  1000    PT Time Calculation (min)  45 min    Activity Tolerance  Patient tolerated treatment well    Behavior During Therapy  [facility] for tasks assessed/performed       Past Medical History:  Diagnosis Date  . Arthritis   . Diabetes mellitus without complication ([place])    TYPE 2  . Diverticulosis   . Dyslipidemia   . GERD (gastroesophageal reflux disease)   . Herpes labialis   . Hypertension   . Longstanding persistent atrial fibrillation ([place])   . Metabolic syndrome   . Obesity   . Proteinuria   . Psoriasis   . Seborrheic dermatitis   . Sleep apnea    very compliant with CPAP, PT NEEDS TO BRING OWN MACHINE  . TIA (transient ischemic attack) 2009    Past Surgical History:  Procedure Laterality Date  . CARDIOVERSION N/A 12/02/2016   Procedure: CARDIOVERSION;  Surgeon: Pixie Casino, MD;  Location: [facility] ENDOSCOPY;  Service: Cardiovascular;  Laterality: N/A;  . COLONOSCOPY    . EXCISIONAL TOTAL KNEE ARTHROPLASTY WITH ANTIBIOTIC SPACERS Right 03/28/2019   Procedure: EXCISIONAL TOTAL KNEE ARTHROPLASTY WITH ANTIBIOTIC SPACERS;  Surgeon: [name], MD;  Location: [facility] ORS;  Service: Orthopedics;  Laterality: Right;  90 mins  . I & D KNEE WITH POLY EXCHANGE Right 09/10/2018   Procedure: Right Knee Arthroplasty IRRIGATION AND DEBRIDEMENT  KNEE WITH POLY EXCHANGE;  Surgeon: [name], MD;  Location: [facility] ORS;  Service: Orthopedics;  Laterality: Right;  . JOINT REPLACEMENT  11/03/10   LT HIP  . PFO occluder cardiac valve  2006   Dr. Einar Gip, (hole in heart)  . REIMPLANTATION OF TOTAL KNEE Right 06/15/2019   Procedure: Resection of antibiotic spacer and  irrigation and debridment and placement of new antibiotic spacer and components;  Surgeon: [name], MD;  Location: [facility] ORS;  Service: Orthopedics;  Laterality: Right;  90 mins  . REIMPLANTATION OF TOTAL KNEE Right 08/22/2019   Procedure: REIMPLANTATION OF TOTAL KNEE;  Surgeon: [name], MD;  Location: [facility] ORS;  Service: Orthopedics;  Laterality: Right;  120 mins  . TONSILLECTOMY AND ADENOIDECTOMY    . TOTAL HIP ARTHROPLASTY Left   . TOTAL KNEE ARTHROPLASTY Right 05/31/2018   Procedure: RIGHT TOTAL KNEE ARTHROPLASTY;  Surgeon: [name], MD;  Location: [facility] ORS;  Service: Orthopedics;  Laterality: Right;  70 mins  . TOTAL KNEE ARTHROPLASTY Left 06/28/2018   Procedure: LEFT TOTAL KNEE ARTHROPLASTY;  Surgeon: [name], MD;  Location: [facility] ORS;  Service: Orthopedics;  Laterality: Left;  70 mins  . WISDOM TOOTH EXTRACTION      There were no vitals filed for this visit.  Subjective Assessment - 10/23/19 0920    Subjective  Patient reports he is doing  well. He feels his knee is more swollen today.    Currently in Pain?  Yes    Pain Score  2     Pain Location  Knee    Pain Orientation  Right    Pain Descriptors / Indicators  Sore    Pain Type  Surgical pain    Pain Onset  More than a month ago    Pain Frequency  Constant         [facility] PT Assessment - 10/23/19 0001      Assessment   Medical Diagnosis  Rt TKA     Referring Provider (PT)  Dr. [name]    Onset Date/Surgical Date  08/22/19      Precautions   Precautions  None      Restrictions   Weight Bearing Restrictions  No      Balance Screen   Has the patient fallen in the past 6 months  No    Has  the patient had a decrease in activity level because of a fear of falling?   No    Is the patient reluctant to leave their home because of a fear of falling?   No      Prior Function   Level of Independence  Independent    Vocation  Full time employment      AROM   Right Knee Extension  4   lacking   Right Knee Flexion  112      Strength   Right Knee Flexion  4+/5    Right Knee Extension  4/5    Left Knee Flexion  5/5    Left Knee Extension  5/5                   [facility] Adult PT Treatment/Exercise - 10/23/19 0001      Exercises   Exercises  Knee/Hip      Knee/Hip Exercises: Stretches   Passive Hamstring Stretch  2 reps;30 seconds    Passive Hamstring Stretch Limitations  seated    Quad Stretch  2 reps;30 seconds    Quad Stretch Limitations  supine edge of mat    Gastroc Stretch  2 reps;30 seconds    Gastroc Stretch Limitations  slant board      Knee/Hip Exercises: Aerobic   Recumbent Bike  L2 x 6 min to improve knee flexion      Knee/Hip Exercises: Machines for Strengthening   Cybex Knee Extension  20# 3x15    Cybex Knee Flexion  45# 3x15    Cybex Leg Press  75# 4x10      Manual Therapy   Manual Therapy  Joint mobilization;Passive ROM    Joint Mobilization  AP and rotational tibiofemoral joint mobs in supine and seated, PF joint mobs in all directions    Passive ROM  Knee flexion and extension in supine and stretching in seated             PT Education - 10/23/19 0923    Education Details  HEP    Person(s) Educated  Patient    Methods  Explanation;Demonstration;Verbal cues    Comprehension  Verbalized understanding;Returned demonstration;Verbal cues required;Need further instruction       PT Short Term Goals - 10/23/19 1321      PT SHORT TERM GOAL #1   Title  Pt will be I with initial HEP for Rt knee ROM and strength, balance    Time  4    Period  Weeks    Status  Achieved    Target Date  09/25/19      PT SHORT TERM GOAL #2   Title  Pt  will be able to walk with/without least restricted assistive device in clinic with min noticeable limp, improved heel strike.    Time  4    Period  Weeks    Status  Achieved    Target Date  09/25/19      PT SHORT TERM GOAL #3   Title  Pt will be able to sit with knee flexed to 95 deg for improved sit to stand symmetry and ease    Time  6    Period  Weeks    Status  Achieved    Target Date  09/25/19      PT SHORT TERM GOAL #4   Title  Pt will be able to demo passive knee extension to no more than 10 deg. to work towards near full extension and normal knee function.    Time  4    Period  Weeks    Status  Achieved    Target Date  09/25/19      PT SHORT TERM GOAL #5   Title  Pt will understand RICE in order to minimize pain and swelling in RT LE    Time  8    Period  Weeks    Status  Achieved    Target Date  09/25/19        PT Long Term Goals - 10/23/19 1322      PT LONG TERM GOAL #1   Title  Pt will improve FOTO score to no more than 49 % limited to demo improved functional mobility.    Baseline  64%    Time  8    Period  Weeks    Status  Revised    Target Date  12/18/19      PT LONG TERM GOAL #2   Title  Pt will be able to perform light recreational activites in the home and ADLs  with no more than min occasional difficulty    Time  8    Period  Weeks    Status  Revised    Target Date  12/18/19      PT LONG TERM GOAL #3   Title  Pt will be able to go up and down 12 stairs in his home with min difficulty, reciprocal and using 1 rail.    Time  8    Period  Weeks    Status  Revised    Target Date  12/18/19      PT LONG TERM GOAL #4   Title  Pt will demo 5/5 knee strength as well as hip abduction for normal, safe gait mechanics    Time  8    Period  Weeks    Status  Revised    Target Date  12/18/19      PT LONG TERM GOAL #5   Title  Pt will be able to extend knee AROM to lacking no more than 10 deg.    Time  8    Period  Weeks    Status  Achieved      PT  LONG TERM GOAL #6   Title  Pt will be I with final HEP upong discharge for long term mobility and health.    Time  8    Period  Weeks    Status  On-going  Target Date  12/18/19            Plan - 10/23/19 0931    Clinical Impression Statement  Patient tolerated therapy well with no adverse effects, he continues to exhibit limitation in right knee motion and strength compared to opposite side. He is progressing well with strengthening exercises and seems to be tolerating greater activity levels. He did exhibit increased swelling of the knee this visit but no other signs of infection. He would benefit from continued skilled PT to progress his range of motion and strength to allow for improved tolerance to activity and walking without limitation.    PT Frequency  1x / week    PT Duration  8 weeks    PT Treatment/Interventions  ADLs/Self Care Home Management;Stair training;Functional mobility training;Cryotherapy;Neuromuscular re-education;Balance training;Therapeutic exercise;Therapeutic activities;Manual lymph drainage;Taping;Vasopneumatic Device;Manual techniques;Passive range of motion;Patient/family education;DME Instruction;Gait training;Electrical Stimulation    PT Next Visit Plan  Assess HEP and progress PRN, ROM and strength as tol, recumbant, edema mgmt    PT Home Exercise Plan  QS, towel slide flexion, SLR, sit to stand no UE, calf stretch, heel raises, seated or supine hamstring stretch, standing hip abduction and extension, seated hamstring curl    Consulted and Agree with Plan of Care  Patient       Patient will benefit from skilled therapeutic intervention in order to improve the following deficits and impairments:  Difficulty walking, Increased fascial restricitons, Decreased range of motion, Pain, Decreased skin integrity, Decreased balance, Decreased scar mobility, Hypomobility, Impaired flexibility, Decreased strength, Decreased mobility, Increased edema, Postural  dysfunction  Visit Diagnosis: S/P right knee arthroscopy  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 Chronic systolic CHF (congestive heart failure) ([place]) 09/27/2019  . S/P right TK rev 08/22/2019  . Status post revision of total knee, right 06/15/2019  . 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 High risk medication use 11/08/2018  . Infected right TKA 09/10/2018  . S/P left TKA 06/28/2018  . S/P TKR (total knee replacement), right 05/31/2018  . Osteoarthritis of right knee 05/09/2018  . Tubular adenoma of colon 10/18/2017  . 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 Diabetic nephropathy associated with type 2 diabetes mellitus ([place]) 10/13/2016  . Permanent atrial fibrillation ([place]) 10/13/2016  . Health examination of defined subpopulation 11/15/2015  . Polycythemia vera ([place]) 10/09/2014  . Hyperlipidemia associated with type 2 diabetes mellitus ([place]) 09/17/2014  . Hypertension associated with diabetes ([place]) 09/17/2014  . Diabetes mellitus ([place]) 10/31/2013  . Hx-TIA (transient ischemic attack) 09/05/2012  . GERD (gastroesophageal reflux disease) 08/17/2011  . Obesity (BMI 30-39.9) 08/17/2011  . Psoriatic arthritis ([place]) 08/17/2011  . Obstructive sleep apnea 08/05/2009    Hilda Blades, PT, DPT, LAT, ATC 10/23/19  1:24 PM Phone: [phone] Fax: [facility] [address] [place], [name], [address] Phone: [phone]   Fax:  [phone]  Name: [name]. MRN: [id number] Date of Birth: 1961/03/21

## 2019-10-23 NOTE — Progress Notes (Signed)
William D Sanson Jr. is a 59 y.o. male who presents today for a commercial driver fitness determination physical exam.  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   Last DOT physical 10/2018 here with me.  Medical care team includes:  Dr. Redmond School here for primary care since 1976.  Dr. [name], orthopedics  Dr. Mertie Moores, cardiology  This past October had another articulating spacer right knee after prior surgery 2019.  Ended up with joint replacement right knee 08/2019.  He is now 2 months into physical therapy.   Current 2 times per week physical therapy.  Soon will be going to 1 x per week physical therapy.    Still has significant fluid on the right knee.  Is consider another visit with ortho to have fluid drained from right knee.  Worried about infection though.     [name] for psoriatic arthritis, prescribed by Dr. Redmond School here since he did so well on the same type of medication from drug trial.  He has hx/o HTN, PFO, s/p occlusion device, hx/o chronic CHF.        Review of Systems A comprehensive review of systems was reviewed and noted as below:  Eye: +uses reading glasses, but otherwise no corrective lenses, -lasik surgery or other eye surgery, -glaucoma, -cataracts, -macular degeneration, -monocular vision, -medication for eye condition, -blurred vision,   Ears: -hearing problems, - hearing aids, -ear pain, -ear drainage, -ear fullness, -tinnitus, -recurrent ear infection, -previous ear surgery, - vertigo, -meniere's disease  Endocrine: -polydipsia, -polyuria, -intentional weight loss, -fainting, -dizziness, - altered or loss of consciousness, -hypoglycemia  Cardiovascular: +heart disease, +CHF, -heart attack, -cardiac stents, -bypass surgery, +other heart surgery/prior procedure for PFO, +hypertension, -blood  clots, -pacemaker, -medications for heart condition, -chest pain, -SOB, -palpitations, -fainting, -dizziness, -dyspnea  Respiratory: -asthma, -COPD, other lung disease, -smoker, -chest tightness, - wheezing, -snoring, -daytime sleepiness, +sleep apnea/uses CPAP, -narcolepsy  Allergy: -uncontrollable sneezing or allergy symptoms  Musculoskeletal: -missing body parts, -muscle disease, -bone disease, -spine injury, -low back pain, -medication for joints, bones, muscles or pain, -physical limitations, -joint pain, -neck pain, -limitations of neck ROM, -back surgery, +left hip replacement,  +hx/o psoriasis, -gout  Neurologic: -neurologic disease, -dementia, -seizures, -parkinson's, -tremor, -memory problems, -weakness, -numbness, -tingling, -medication for neurologic condition, -medications for sleep condition  Gastric: -abdominal pain, -chronic diarrhea or IBS, -uncontrollable nausea  Kidney/Renal: -hematuria, -dialysis, kidney disease, polycystic kidney disease  Psychiatric: -homicidal thoughts, -suicidal thoughts, -prior suicide attempts, -get into fights/hurting others, -memory or concentration problems, -delusions, -hallucinations, -hospitalization for mental health problem, -depression, -anxiety, -bipolar  Drug use: - none  Reviewed their medical, surgical, family, social, medication, and allergy history and updated chart as appropriate.     Objective:   Physical Exam  BP 118/80   Pulse 79   Temp 97.8 F (36.6 C)   Ht 5\' 9"  (1.753 m)   Wt 281 lb 6.4 oz (127.6 kg)   SpO2 97%   BMI 41.56 kg/m   Wt Readings from Last 3 Encounters:  10/23/19 281 lb 6.4 oz (127.6 kg)  09/27/19 275 lb (124.7 kg)  08/22/19 255 lb 11.7 oz (116 kg)   BP Readings from Last 3 Encounters:  10/23/19 118/80  09/27/19 120/90  08/23/19 132/80    General appearance: alert, no distress, WD/WN, obese white male Skin: no worrisome findings otherwise , no warmth  or redness of right knee Neck: supple, no  lymphadenopathy, no thyromegaly, no masses, normal ROM, no bruits Chest: non tender, normal shape and expansion Heart: RRR, normal S1, S2, no murmurs Lungs: CTA bilaterally, no wheezes, rhonchi, or rales Abdomen: +bs, soft, obese, non tender, non distended, no masses, no hepatomegaly, no splenomegaly, no bruits Back: non tender, normal ROM, no scoliosis Musculoskeletal:  bilat knee surgical scars, linearly vertically but right knee with mild to moderate swelling, mild pain noted with standing, otherwise left buttock/posterior hip surgical scar, left forearm dorsal with diagonal long scar from childhood trauma, otherwise upper extremities non tender, no obvious deformity, normal ROM throughout, lower extremities non tender, no obvious deformity, normal ROM throughout Extremities: right knee with mild to moderate swelling, otherwise no edema, no cyanosis, no clubbing Pulses: 2+ symmetric, upper and lower extremities, normal cap refill Neurological: nonfocal exam Psychiatric: normal affect, behavior normal, pleasant  GU: no hernia Rectal: deferred  Vision, hearing, urinalysis reviewed.      Assessment:   Encounter Diagnoses  Name Primary?  . Health examination of defined subpopulation Yes  . Hypertension associated with diabetes ([place])   . Permanent atrial fibrillation ([place])   . Chronic systolic CHF (congestive heart failure) ([place])   . Obstructive sleep apnea   . Type 2 diabetes mellitus with diabetic nephropathy, without long-term current use of insulin ([place])   . Hyperlipidemia associated with type 2 diabetes mellitus ([place])   . Hx-TIA (transient ischemic attack)   . S/P TKR (total knee replacement), right   . High risk medication use     Plan:   DOT evaluation - cleared for 1 year certificate  Chronic afib and Anticoagulation long term, chronic CHF - reviewed 09/2019 cardiology notes from Dr. Acie Fredrickson.   Continues on Coreg, [place], continues on Xarelto.        S/p bilat Knee  replacement, s/p multiple surgeries 2019- 2020 right knee, sepsis 2020 s/p surgery.  Overall stable and still seeing therapy regularly to help keep right knee stable and in good function.   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   Diabetes - HgbA1C 52mo ago was 6.4%.   Controlled on current medication.  HTN - controlled on current medication  OSA on CPAP, compliant  History of TIA with repair of PFO  F/u with Dr. Redmond School and specialist as planned.

## 2019-10-24 ENCOUNTER — Ambulatory Visit: Payer: 59 | Admitting: Physical Therapy

## 2019-10-24 ENCOUNTER — Encounter: Payer: 59 | Admitting: Family Medicine

## 2019-10-26 ENCOUNTER — Other Ambulatory Visit: Payer: Self-pay

## 2019-10-26 ENCOUNTER — Encounter: Payer: Self-pay | Admitting: Physical Therapy

## 2019-10-26 ENCOUNTER — Ambulatory Visit: Payer: 59 | Admitting: Physical Therapy

## 2019-10-26 DIAGNOSIS — Z96651 Presence of right artificial knee joint: Secondary | ICD-10-CM | POA: Diagnosis not present

## 2019-10-26 DIAGNOSIS — R262 Difficulty in walking, not elsewhere classified: Secondary | ICD-10-CM | POA: Diagnosis not present

## 2019-10-26 DIAGNOSIS — Z9889 Other specified postprocedural states: Secondary | ICD-10-CM

## 2019-10-26 DIAGNOSIS — R600 Localized edema: Secondary | ICD-10-CM

## 2019-10-26 DIAGNOSIS — M6281 Muscle weakness (generalized): Secondary | ICD-10-CM

## 2019-10-26 NOTE — Therapy (Signed)
[place], [name], [address] Phone: [phone]   Fax:  [phone]  Physical Therapy Treatment  Patient Details  Name: Lanier Bemiss. MRN: [id number] Date of Birth: [birth year] Referring Provider (PT): Dr. [name]   Encounter Date: 10/26/2019  PT End of Session - 10/26/19 1306    Visit Number  22    Number of Visits  25    Date for PT Re-Evaluation  12/18/19    Authorization Type  [facility] UMR    PT Start Time  0915    PT Stop Time  1015    PT Time Calculation (min)  60 min    Activity Tolerance  Patient tolerated treatment well    Behavior During Therapy  [facility] for tasks assessed/performed       Past Medical History:  Diagnosis Date  . Arthritis   . Diabetes mellitus without complication ([place])    TYPE 2  . Diverticulosis   . Dyslipidemia   . GERD (gastroesophageal reflux disease)   . Herpes labialis   . Hypertension   . Longstanding persistent atrial fibrillation ([place])   . Metabolic syndrome   . Obesity   . Proteinuria   . Psoriasis   . Seborrheic dermatitis   . Sleep apnea    very compliant with CPAP, PT NEEDS TO BRING OWN MACHINE  . TIA (transient ischemic attack) 2009    Past Surgical History:  Procedure Laterality Date  . CARDIOVERSION N/A 12/02/2016   Procedure: CARDIOVERSION;  Surgeon: Pixie Casino, MD;  Location: [facility] ENDOSCOPY;  Service: Cardiovascular;  Laterality: N/A;  . COLONOSCOPY    . EXCISIONAL TOTAL KNEE ARTHROPLASTY WITH ANTIBIOTIC SPACERS Right 03/28/2019   Procedure: EXCISIONAL TOTAL KNEE ARTHROPLASTY WITH ANTIBIOTIC SPACERS;  Surgeon: [name], MD;  Location: [facility];  Service: Orthopedics;  Laterality: Right;  90 mins  . I & D KNEE WITH POLY EXCHANGE Right 09/10/2018   Procedure: Right Knee Arthroplasty IRRIGATION AND DEBRIDEMENT KNEE WITH POLY EXCHANGE;  Surgeon: [name], MD;  Location: [facility];  Service: Orthopedics;  Laterality: Right;  . JOINT  REPLACEMENT  11/03/10   LT HIP  . PFO occluder cardiac valve  2006   Dr. Einar Gip, (hole in heart)  . REIMPLANTATION OF TOTAL KNEE Right 06/15/2019   Procedure: Resection of antibiotic spacer and  irrigation and debridment and placement of new antibiotic spacer and components;  Surgeon: [name], MD;  Location: [facility];  Service: Orthopedics;  Laterality: Right;  90 mins  . REIMPLANTATION OF TOTAL KNEE Right 08/22/2019   Procedure: REIMPLANTATION OF TOTAL KNEE;  Surgeon: [name], MD;  [facility];  Service: Orthopedics;  Laterality: Right;  120 mins  . TONSILLECTOMY AND ADENOIDECTOMY    . TOTAL HIP ARTHROPLASTY Left   . TOTAL KNEE ARTHROPLASTY Right 05/31/2018   Procedure: RIGHT TOTAL KNEE ARTHROPLASTY;  Surgeon: [name], MD;  Location: [facility];  Service: Orthopedics;  Laterality: Right;  70 mins  . TOTAL KNEE ARTHROPLASTY Left 06/28/2018   Procedure: LEFT TOTAL KNEE ARTHROPLASTY;  Surgeon: [name], MD;  [facility];  Service: Orthopedics;  Laterality: Left;  70 mins  . WISDOM TOOTH EXTRACTION      There were no vitals filed for this visit.  Subjective Assessment - 10/26/19 1303    Subjective  Patient reports he is a little sore today because he did a lot of walking yesterday.Around 6k steps.    Patient Stated Goals  Pt would  like to be able to return to yardwork, tennis    Currently in Pain?  Yes    Pain Score  3     Pain Location  Knee    Pain Orientation  Right    Pain Descriptors / Indicators  Sore    Pain Type  Surgical pain    Pain Onset  More than a month ago    Pain Frequency  Constant                       [facility] Adult PT Treatment/Exercise - 10/26/19 0001      Exercises   Exercises  Knee/Hip      Knee/Hip Exercises: Stretches   Passive Hamstring Stretch  2 reps;30 seconds    Passive Hamstring Stretch Limitations  supine      Knee/Hip Exercises: Aerobic   Recumbent Bike  L2 x 6 min to improve knee flexion      Knee/Hip  Exercises: Machines for Strengthening   Cybex Knee Extension  25# 3x10    Cybex Knee Flexion  55# 3x10    Cybex Leg Press  DL: 105# 2x10, SL: 55# 2x10 (75# on left)      Knee/Hip Exercises: Standing   Hip Abduction  2 sets;10 reps    Abduction Limitations  red band at ankles    Hip Extension  2 sets;10 reps    Extension Limitations  red band at ankles    Other Standing Knee Exercises  Dead lift 45# from floor 3x8      Modalities   Modalities  Vasopneumatic      Vasopneumatic   Number Minutes Vasopneumatic   15 minutes    Vasopnuematic Location   Knee    Vasopneumatic Pressure  High    Vasopneumatic Temperature   34      Manual Therapy   Manual Therapy  Joint mobilization;Passive ROM    Joint Mobilization  AP and rotational tibiofemoral joint mobs in supine and seated, PF joint mobs in all directions    Passive ROM  Knee flexion and extension in supine and stretching in seated             PT Education - 10/26/19 1305    Education Details  HEP    Person(s) Educated  Patient    Methods  Explanation;Demonstration;Verbal cues    Comprehension  Verbalized understanding;Returned demonstration;Verbal cues required;Need further instruction       PT Short Term Goals - 10/23/19 1321      PT SHORT TERM GOAL #1   Title  Pt will be I with initial HEP for Rt knee ROM and strength, balance    Time  4    Period  Weeks    Status  Achieved    Target Date  09/25/19      PT SHORT TERM GOAL #2   Title  Pt will be able to walk with/without least restricted assistive device in clinic with min noticeable limp, improved heel strike.    Time  4    Period  Weeks    Status  Achieved    Target Date  09/25/19      PT SHORT TERM GOAL #3   Title  Pt will be able to sit with knee flexed to 95 deg for improved sit to stand symmetry and ease    Time  6    Period  Weeks    Status  Achieved    Target Date  09/25/19  PT SHORT TERM GOAL #4   Title  Pt will be able to demo passive knee  extension to no more than 10 deg. to work towards near full extension and normal knee function.    Time  4    Period  Weeks    Status  Achieved    Target Date  09/25/19      PT SHORT TERM GOAL #5   Title  Pt will understand RICE in order to minimize pain and swelling in RT LE    Time  8    Period  Weeks    Status  Achieved    Target Date  09/25/19        PT Long Term Goals - 10/23/19 1322      PT LONG TERM GOAL #1   Title  Pt will improve FOTO score to no more than 49 % limited to demo improved functional mobility.    Baseline  64%    Time  8    Period  Weeks    Status  Revised    Target Date  12/18/19      PT LONG TERM GOAL #2   Title  Pt will be able to perform light recreational activites in the home and ADLs  with no more than min occasional difficulty    Time  8    Period  Weeks    Status  Revised    Target Date  12/18/19      PT LONG TERM GOAL #3   Title  Pt will be able to go up and down 12 stairs in his home with min difficulty, reciprocal and using 1 rail.    Time  8    Period  Weeks    Status  Revised    Target Date  12/18/19      PT LONG TERM GOAL #4   Title  Pt will demo 5/5 knee strength as well as hip abduction for normal, safe gait mechanics    Time  8    Period  Weeks    Status  Revised    Target Date  12/18/19      PT LONG TERM GOAL #5   Title  Pt will be able to extend knee AROM to lacking no more than 10 deg.    Time  8    Period  Weeks    Status  Achieved      PT LONG TERM GOAL #6   Title  Pt will be I with final HEP upong discharge for long term mobility and health.    Time  8    Period  Weeks    Status  On-going    Target Date  12/18/19            Plan - 10/26/19 1308    Clinical Impression Statement  Patient is progressing well with his strengthening exercises. He continues to have increased swelling and limitation in strength and range of motion. He is tolerating greater activity levels and walking more throughout the day. He  would benefit from continued skilled PT to progress his range of motion and strength to allow for improved tolerance to activity and walking without limitation.    PT Treatment/Interventions  ADLs/Self Care Home Management;Stair training;Functional mobility training;Cryotherapy;Neuromuscular re-education;Balance training;Therapeutic exercise;Therapeutic activities;Manual lymph drainage;Taping;Vasopneumatic Device;Manual techniques;Passive range of motion;Patient/family education;DME Instruction;Gait training;Electrical Stimulation    PT Next Visit Plan  Assess HEP and progress PRN, ROM and strength as tol, recumbant, edema mgmt  PT Home Exercise Plan  QS, towel slide flexion, SLR, sit to stand no UE, calf stretch, heel raises, seated or supine hamstring stretch, standing hip abduction and extension, seated hamstring curl    Consulted and Agree with Plan of Care  Patient       Patient will benefit from skilled therapeutic intervention in order to improve the following deficits and impairments:  Difficulty walking, Increased fascial restricitons, Decreased range of motion, Pain, Decreased skin integrity, Decreased balance, Decreased scar mobility, Hypomobility, Impaired flexibility, Decreased strength, Decreased mobility, Increased edema, Postural dysfunction  Visit Diagnosis: S/P right knee arthroscopy  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 Chronic systolic CHF (congestive heart failure) ([place]) 09/27/2019  . S/P right TK rev 08/22/2019  . Status post revision of total knee, right 06/15/2019  . 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 Acquired absence of knee joint following removal of joint  prosthesis with presence of antibiotic-impregnated cement spacer 03/28/2019  . High risk medication use 11/08/2018  . Infected right TKA 09/10/2018  . S/P left TKA 06/28/2018  . S/P TKR (total knee replacement), right 05/31/2018  . Osteoarthritis of right knee 05/09/2018  . Tubular adenoma of colon 10/18/2017  . 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 Diabetic nephropathy associated with type 2 diabetes mellitus ([place]) 10/13/2016  . Permanent atrial fibrillation ([place]) 10/13/2016  . Health examination of defined subpopulation 11/15/2015  . Polycythemia vera ([place]) 10/09/2014  . Hyperlipidemia associated with type 2 diabetes mellitus ([place]) 09/17/2014  . Hypertension associated with diabetes ([place]) 09/17/2014  . Diabetes mellitus ([place]) 10/31/2013  . Hx-TIA (transient ischemic attack) 09/05/2012  . GERD (gastroesophageal reflux disease) 08/17/2011  . Obesity (BMI 30-39.9) 08/17/2011  . Psoriatic arthritis ([place]) 08/17/2011  . Obstructive sleep apnea 08/05/2009    Hilda Blades, PT, DPT, LAT, ATC 10/26/19  1:15 PM Phone: [phone] Fax: [facility] [address] [place], [name], [address] Phone: [phone]   Fax:  [phone]  Name: Guled Von. MRN: [id number] Date of Birth: October 04, 1960

## 2019-10-30 MED FILL — TALTZ 80 MG/ML AUTOINJECTOR: 80 | 28 days supply | Qty: 1 | Fill #1

## 2019-11-02 ENCOUNTER — Ambulatory Visit: Payer: 59 | Attending: Orthopedic Surgery | Admitting: Physical Therapy

## 2019-11-02 ENCOUNTER — Encounter: Payer: Self-pay | Admitting: Physical Therapy

## 2019-11-02 ENCOUNTER — Other Ambulatory Visit: Payer: Self-pay

## 2019-11-02 DIAGNOSIS — R600 Localized edema: Secondary | ICD-10-CM | POA: Diagnosis not present

## 2019-11-02 DIAGNOSIS — Z9889 Other specified postprocedural states: Secondary | ICD-10-CM

## 2019-11-02 DIAGNOSIS — M6281 Muscle weakness (generalized): Secondary | ICD-10-CM

## 2019-11-02 DIAGNOSIS — Z96651 Presence of right artificial knee joint: Secondary | ICD-10-CM | POA: Diagnosis not present

## 2019-11-02 DIAGNOSIS — R262 Difficulty in walking, not elsewhere classified: Secondary | ICD-10-CM

## 2019-11-02 NOTE — Therapy (Signed)
[place], [name], [address] Phone: [phone]   Fax:  [phone]  Physical Therapy Treatment  Patient Details  Name: [name]. MRN: [id number] Date of Birth: Nov 01, 1960 Referring Provider (PT): Dr. [name]   Encounter Date: 11/02/2019  PT End of Session - 11/02/19 1052    Visit Number  23    Number of Visits  25    Date for PT Re-Evaluation  12/18/19    Authorization Type  [facility] UMR    PT Start Time  1012    PT Stop Time  1052    PT Time Calculation (min)  40 min    Activity Tolerance  Patient tolerated treatment well    Behavior During Therapy  WFL for tasks assessed/performed       Past Medical History:  Diagnosis Date  . Arthritis   . Diabetes mellitus without complication ([place])    TYPE 2  . Diverticulosis   . Dyslipidemia   . GERD (gastroesophageal reflux disease)   . Herpes labialis   . Hypertension   . Longstanding persistent atrial fibrillation ([place])   . Metabolic syndrome   . Obesity   . Proteinuria   . Psoriasis   . Seborrheic dermatitis   . Sleep apnea    very compliant with CPAP, PT NEEDS TO BRING OWN MACHINE  . TIA (transient ischemic attack) 2009    Past Surgical History:  Procedure Laterality Date  . CARDIOVERSION N/A 12/02/2016   Procedure: CARDIOVERSION;  Surgeon: Pixie Casino, MD;  Location: [facility] ENDOSCOPY;  Service: Cardiovascular;  Laterality: N/A;  . COLONOSCOPY    . EXCISIONAL TOTAL KNEE ARTHROPLASTY WITH ANTIBIOTIC SPACERS Right 03/28/2019   Procedure: EXCISIONAL TOTAL KNEE ARTHROPLASTY WITH ANTIBIOTIC SPACERS;  Surgeon: [name], MD;  [facility];  Service: Orthopedics;  Laterality: Right;  90 mins  . I & D KNEE WITH POLY EXCHANGE Right 09/10/2018   Procedure: Right Knee Arthroplasty IRRIGATION AND DEBRIDEMENT KNEE WITH POLY EXCHANGE;  Surgeon: [name], MD;  Location: [facility];  Service: Orthopedics;  Laterality: Right;  . JOINT  REPLACEMENT  11/03/10   LT HIP  . PFO occluder cardiac valve  2006   Dr. Einar Gip, (hole in heart)  . REIMPLANTATION OF TOTAL KNEE Right 06/15/2019   Procedure: Resection of antibiotic spacer and  irrigation and debridment and placement of new antibiotic spacer and components;  Surgeon: [name], MD;  [facility]: [facility];  Service: Orthopedics;  Laterality: Right;  90 mins  . REIMPLANTATION OF TOTAL KNEE Right 08/22/2019   Procedure: REIMPLANTATION OF TOTAL KNEE;  Surgeon: [name], MD;  [facility];  Service: Orthopedics;  Laterality: Right;  120 mins  . TONSILLECTOMY AND ADENOIDECTOMY    . TOTAL HIP ARTHROPLASTY Left   . TOTAL KNEE ARTHROPLASTY Right 05/31/2018   Procedure: RIGHT TOTAL KNEE ARTHROPLASTY;  Surgeon: [name], MD;  [facility];  Service: Orthopedics;  Laterality: Right;  70 mins  . TOTAL KNEE ARTHROPLASTY Left 06/28/2018   Procedure: LEFT TOTAL KNEE ARTHROPLASTY;  Surgeon: [name], MD;  [facility];  Service: Orthopedics;  Laterality: Left;  70 mins  . WISDOM TOOTH EXTRACTION      There were no vitals filed for this visit.  Subjective Assessment - 11/02/19 1019    Subjective  Patient reports he is doing about the same. He did a lot of walking and standing recently so he is pretty sore from that.  Patient Stated Goals  Pt would like to be able to return to yardwork, tennis    Currently in Pain?  Yes    Pain Score  2     Pain Location  Knee    Pain Orientation  Right    Pain Descriptors / Indicators  Sore    Pain Type  Surgical pain    Pain Onset  More than a month ago    Pain Frequency  Constant         [facility] PT Assessment - 11/02/19 0001      Assessment   Medical Diagnosis  Rt TKA     Referring Provider (PT)  Dr. [name]    Onset Date/Surgical Date  08/22/19      AROM   Right Knee Extension  4   lacking   Right Knee Flexion  116                   [facility] Adult PT Treatment/Exercise - 11/02/19 0001       Exercises   Exercises  Knee/Hip      Knee/Hip Exercises: Aerobic   Recumbent Bike  L2 x 6 min to improve knee flexion      Knee/Hip Exercises: Machines for Strengthening   Cybex Knee Flexion  55# 3x10    Cybex Leg Press  DL: 125# 3x10; SL: 65# 2x8      Knee/Hip Exercises: Standing   Hip Abduction  2 sets;10 reps    Abduction Limitations  red band at knees    Hip Extension  2 sets;10 reps    Extension Limitations  red band at knees    Forward Step Up  2 sets;10 reps;Step Height: 8"      Modalities   Modalities  --      Vasopneumatic   Number Minutes Vasopneumatic   --    Vasopnuematic Location   --    Vasopneumatic Pressure  --    Vasopneumatic Temperature   --      Manual Therapy   Manual Therapy  Joint mobilization;Passive ROM    Joint Mobilization  AP and rotational tibiofemoral joint mobs in supine and seated, PF joint mobs in all directions    Passive ROM  Knee flexion and extension in supine and stretching in seated             PT Education - 11/02/19 1051    Education Details  HEP    Person(s) Educated  Patient    Methods  Explanation;Demonstration;Verbal cues    Comprehension  Verbalized understanding;Returned demonstration;Verbal cues required;Need further instruction       PT Short Term Goals - 10/23/19 1321      PT SHORT TERM GOAL #1   Title  Pt will be I with initial HEP for Rt knee ROM and strength, balance    Time  4    Period  Weeks    Status  Achieved    Target Date  09/25/19      PT SHORT TERM GOAL #2   Title  Pt will be able to walk with/without least restricted assistive device in clinic with min noticeable limp, improved heel strike.    Time  4    Period  Weeks    Status  Achieved    Target Date  09/25/19      PT SHORT TERM GOAL #3   Title  Pt will be able to sit with knee flexed to 95 deg for improved sit  to stand symmetry and ease    Time  6    Period  Weeks    Status  Achieved    Target Date  09/25/19      PT SHORT TERM GOAL  #4   Title  Pt will be able to demo passive knee extension to no more than 10 deg. to work towards near full extension and normal knee function.    Time  4    Period  Weeks    Status  Achieved    Target Date  09/25/19      PT SHORT TERM GOAL #5   Title  Pt will understand RICE in order to minimize pain and swelling in RT LE    Time  8    Period  Weeks    Status  Achieved    Target Date  09/25/19        PT Long Term Goals - 10/23/19 1322      PT LONG TERM GOAL #1   Title  Pt will improve FOTO score to no more than 49 % limited to demo improved functional mobility.    Baseline  64%    Time  8    Period  Weeks    Status  Revised    Target Date  12/18/19      PT LONG TERM GOAL #2   Title  Pt will be able to perform light recreational activites in the home and ADLs  with no more than min occasional difficulty    Time  8    Period  Weeks    Status  Revised    Target Date  12/18/19      PT LONG TERM GOAL #3   Title  Pt will be able to go up and down 12 stairs in his home with min difficulty, reciprocal and using 1 rail.    Time  8    Period  Weeks    Status  Revised    Target Date  12/18/19      PT LONG TERM GOAL #4   Title  Pt will demo 5/5 knee strength as well as hip abduction for normal, safe gait mechanics    Time  8    Period  Weeks    Status  Revised    Target Date  12/18/19      PT LONG TERM GOAL #5   Title  Pt will be able to extend knee AROM to lacking no more than 10 deg.    Time  8    Period  Weeks    Status  Achieved      PT LONG TERM GOAL #6   Title  Pt will be I with final HEP upong discharge for long term mobility and health.    Time  8    Period  Weeks    Status  On-going    Target Date  12/18/19            Plan - 11/02/19 1100    Clinical Impression Statement  Patient exhibited improved knee flexion this visit and is continuing to progress well with his strengthening. He continues to exhibit incerased swelling and reports difficulty with  stairs and he limps when wlaking for extended periods. He would benefit from continued skilled PT to progress his range of motion and strength to allow for improved tolerance to activity and walking without limitation.    PT Treatment/Interventions  ADLs/Self Care Home Management;Stair training;Functional mobility training;Cryotherapy;Neuromuscular re-education;Balance  training;Therapeutic exercise;Therapeutic activities;Manual lymph drainage;Taping;Vasopneumatic Device;Manual techniques;Passive range of motion;Patient/family education;DME Instruction;Gait training;Electrical Stimulation    PT Next Visit Plan  Assess HEP and progress PRN, ROM and strength as tol, recumbant, edema mgmt    PT Home Exercise Plan  QS, towel slide flexion, SLR, sit to stand no UE, calf stretch, heel raises, seated or supine hamstring stretch, standing hip abduction and extension, seated hamstring curl    Consulted and Agree with Plan of Care  Patient       Patient will benefit from skilled therapeutic intervention in order to improve the following deficits and impairments:  Difficulty walking, Increased fascial restricitons, Decreased range of motion, Pain, Decreased skin integrity, Decreased balance, Decreased scar mobility, Hypomobility, Impaired flexibility, Decreased strength, Decreased mobility, Increased edema, Postural dysfunction  Visit Diagnosis: S/P right knee arthroscopy  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 Chronic systolic CHF (congestive heart failure) ([place]) 09/27/2019  . S/P right TK rev 08/22/2019  . Status post revision of total knee, right 06/15/2019  . 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 High risk medication use 11/08/2018  . Infected right TKA 09/10/2018  . S/P left TKA 06/28/2018  . S/P TKR (total knee replacement), right 05/31/2018  . Osteoarthritis of right knee 05/09/2018  . Tubular adenoma of colon 10/18/2017  . 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 Diabetic nephropathy associated with type 2 diabetes mellitus ([place]) 10/13/2016  . Permanent atrial fibrillation ([place]) 10/13/2016  . Health examination of defined subpopulation 11/15/2015  . Polycythemia vera ([place]) 10/09/2014  . Hyperlipidemia associated with type 2 diabetes mellitus ([place]) 09/17/2014  . Hypertension associated with diabetes ([place]) 09/17/2014  . Diabetes mellitus ([place]) 10/31/2013  . Hx-TIA (transient ischemic attack) 09/05/2012  . GERD (gastroesophageal reflux disease) 08/17/2011  . Obesity (BMI 30-39.9) 08/17/2011  . Psoriatic arthritis ([place]) 08/17/2011  . Obstructive sleep apnea 08/05/2009    Hilda Blades, PT, DPT, LAT, ATC 11/02/19  11:03 AM Phone: [phone] Fax: [facility] [address] [place], [name], [address] Phone: [phone]   Fax:  [phone]  Name: [name]. MRN: [id number] Date of Birth: September 07, 1960

## 2019-11-06 ENCOUNTER — Ambulatory Visit: Payer: 59 | Attending: Internal Medicine

## 2019-11-06 DIAGNOSIS — Z23 Encounter for immunization: Secondary | ICD-10-CM | POA: Insufficient documentation

## 2019-11-06 MED FILL — CELECOXIB 200 MG CAP: 200 | 30 days supply | Qty: 30 | Fill #1

## 2019-11-06 NOTE — Progress Notes (Signed)
   [id number]  Name:  Thierno Capretta.    MRN: [id number] DOB: November 14, 1960  11/06/2019  Mr. Gentis was observed post Covid-19 immunization for 15 minutes without incident. He was provided with Vaccine Information Sheet and instruction to access the V-Safe system.   Mr. Kopplin was instructed to call 911 with any severe reactions post vaccine: [place] Kitchen Difficulty breathing  . Swelling of face and throat  . A fast heartbeat  . A bad rash all over body  . Dizziness and weakness   Immunizations Administered    Name Date Dose VIS Date Route   Pfizer COVID-19 Vaccine 11/06/2019  8:43 AM 0.3 mL 08/11/2019 Intramuscular   Manufacturer: [place]   [place]ot: [id number]   [place]: [id number]

## 2019-11-09 ENCOUNTER — Ambulatory Visit: Payer: 59 | Admitting: Physical Therapy

## 2019-11-13 ENCOUNTER — Other Ambulatory Visit: Payer: Self-pay | Admitting: Family Medicine

## 2019-11-13 ENCOUNTER — Other Ambulatory Visit: Payer: Self-pay | Admitting: Cardiovascular Disease

## 2019-11-13 DIAGNOSIS — E1121 Type 2 diabetes mellitus with diabetic nephropathy: Secondary | ICD-10-CM

## 2019-11-13 MED FILL — JARDIANCE 10 MG TABLET: 10 | 30 days supply | Qty: 30 | Fill #0

## 2019-11-13 MED FILL — DOXYCYCLINE HYCLATE 100 MG: 100 | 30 days supply | Qty: 60 | Fill #3

## 2019-11-14 MED FILL — ENTRESTO 24 MG-26 MG TABLET: 24-26 | 30 days supply | Qty: 60 | Fill #0

## 2019-11-16 ENCOUNTER — Ambulatory Visit: Payer: 59 | Admitting: Family Medicine

## 2019-11-16 ENCOUNTER — Other Ambulatory Visit: Payer: Self-pay

## 2019-11-16 ENCOUNTER — Encounter: Payer: Self-pay | Admitting: Family Medicine

## 2019-11-16 VITALS — BP 124/78 | HR 62 | Temp 97.5°F | Ht 70.0 in | Wt 283.0 lb

## 2019-11-16 DIAGNOSIS — E1169 Type 2 diabetes mellitus with other specified complication: Secondary | ICD-10-CM | POA: Diagnosis not present

## 2019-11-16 DIAGNOSIS — I5022 Chronic systolic (congestive) heart failure: Secondary | ICD-10-CM | POA: Diagnosis not present

## 2019-11-16 DIAGNOSIS — G4733 Obstructive sleep apnea (adult) (pediatric): Secondary | ICD-10-CM | POA: Diagnosis not present

## 2019-11-16 DIAGNOSIS — Z8673 Personal history of transient ischemic attack (TIA), and cerebral infarction without residual deficits: Secondary | ICD-10-CM

## 2019-11-16 DIAGNOSIS — D45 Polycythemia vera: Secondary | ICD-10-CM

## 2019-11-16 DIAGNOSIS — Z0000 Encounter for general adult medical examination without abnormal findings: Secondary | ICD-10-CM

## 2019-11-16 DIAGNOSIS — K219 Gastro-esophageal reflux disease without esophagitis: Secondary | ICD-10-CM

## 2019-11-16 DIAGNOSIS — Z79899 Other long term (current) drug therapy: Secondary | ICD-10-CM | POA: Diagnosis not present

## 2019-11-16 DIAGNOSIS — E1159 Type 2 diabetes mellitus with other circulatory complications: Secondary | ICD-10-CM

## 2019-11-16 DIAGNOSIS — E669 Obesity, unspecified: Secondary | ICD-10-CM

## 2019-11-16 DIAGNOSIS — E1121 Type 2 diabetes mellitus with diabetic nephropathy: Secondary | ICD-10-CM | POA: Diagnosis not present

## 2019-11-16 DIAGNOSIS — I152 Hypertension secondary to endocrine disorders: Secondary | ICD-10-CM

## 2019-11-16 DIAGNOSIS — L4050 Arthropathic psoriasis, unspecified: Secondary | ICD-10-CM

## 2019-11-16 DIAGNOSIS — E785 Hyperlipidemia, unspecified: Secondary | ICD-10-CM

## 2019-11-16 DIAGNOSIS — Z96651 Presence of right artificial knee joint: Secondary | ICD-10-CM

## 2019-11-16 DIAGNOSIS — I4821 Permanent atrial fibrillation: Secondary | ICD-10-CM | POA: Diagnosis not present

## 2019-11-16 DIAGNOSIS — I10 Essential (primary) hypertension: Secondary | ICD-10-CM

## 2019-11-16 LAB — POCT UA - MICROALBUMIN
Albumin/Creatinine Ratio, Urine, POC: 349.5
Creatinine, POC: 28.7 mg/dL
Microalbumin Ur, POC: 100.3 mg/L

## 2019-11-16 LAB — POCT GLYCOSYLATED HEMOGLOBIN (HGB A1C): Hemoglobin A1C: 6.3 % — AB (ref 4.0–5.6)

## 2019-11-16 NOTE — Progress Notes (Signed)
   Subjective:    Patient ID: William Barges., male    DOB: [birth year], 58 y.o.   MRN: [id number]  HPI He is here for complete examination.  He recently had another TKR.  He has had a TKR with revision and also an infection requiring removal of the device and subsequent long-term use of an antibiotic.  He takes 1 Celebrex per night to help with his pain and sometimes uses Tylenol for nighttime comfort.  He presently still on doxycycline for prophylaxis. he also is on Xarelto for treatment of permanent A. fib as well as a TIA secondary to PFO which was patched.  He has a previous history of colonic polyp over his most recent colonoscopy was negative.  He is on a DMARD for his psoriatic arthritis and psoriasis and seems be doing quite nicely with that.  He has a history of polycythemia vera.  He continues on his CPAP and seems to get good result with that.  He is taking Jardiance presently for his diabetes and is not on Metformin.  He is taking Entresto as well as Coreg for his underlying heart disease and does follow-up regularly with cardiology.  His reflux seems to be under good control with every other day PPI.  He is on Crestor and having no difficulty with that.   Review of Systems  All other systems reviewed and are negative.      Objective:   Physical Exam Alert and in no distress. Tympanic membranes and canals are normal. Pharyngeal area is normal. Neck is supple without adenopathy or thyromegaly. Cardiac exam shows a regular sinus rhythm without murmurs or gallops. Lungs are clear to auscultation. Right knee exam does show an effusion but is not hot warm or tender. Hemoglobin A1c is 6.3       Assessment & Plan:  Routine general medical examination at a health care facility  Status post revision of total knee, right  Psoriatic arthritis ([address])  Polycythemia vera ([place])  Permanent atrial fibrillation (HCC)  Obstructive sleep apnea  Obesity (BMI 30-39.9)  Chronic systolic  CHF (congestive heart failure) (HCC)  Type 2 diabetes mellitus with diabetic nephropathy, without long-term current use of insulin ([place]) - Plan: POCT Urinalysis DIP (Proadvantage Device), POCT glycosylated hemoglobin (Hb A1C), POCT UA - Microalbumin  Hx-TIA (transient ischemic attack)  High risk medication use  Diabetic nephropathy associated with type 2 diabetes mellitus (HCC)  Gastroesophageal reflux disease without esophagitis  Hyperlipidemia associated with type 2 diabetes mellitus ([place]) - Plan: Lipid panel  Hypertension associated with diabetes ([place])  At this point he seems to be doing pretty well on his present dietary regimen for his diabetes however I will refer him to [facility] to see if he qualifies for a diabetes/weight loss study.  He will continue on his present medications.  Strongly encouraged him to become more physically active which I think he will now be able to do.  Recheck here in roughly 6 months or sooner if needed.

## 2019-11-17 DIAGNOSIS — Z96651 Presence of right artificial knee joint: Secondary | ICD-10-CM | POA: Diagnosis not present

## 2019-11-17 DIAGNOSIS — Z471 Aftercare following joint replacement surgery: Secondary | ICD-10-CM | POA: Diagnosis not present

## 2019-11-17 LAB — LIPID PANEL
Chol/HDL Ratio: 2.9 ratio (ref 0.0–5.0)
Cholesterol, Total: 99 mg/dL — ABNORMAL LOW (ref 100–199)
HDL: 34 mg/dL — ABNORMAL LOW (ref >39)
LDL Chol Calc (NIH): 30 mg/dL (ref 0–99)
Triglycerides: 228 mg/dL — ABNORMAL HIGH (ref 0–149)
VLDL Cholesterol Cal: 35 mg/dL (ref 5–40)

## 2019-11-22 ENCOUNTER — Encounter: Payer: Self-pay | Admitting: Physical Therapy

## 2019-11-22 ENCOUNTER — Other Ambulatory Visit: Payer: Self-pay

## 2019-11-22 ENCOUNTER — Ambulatory Visit: Payer: 59 | Admitting: Physical Therapy

## 2019-11-22 DIAGNOSIS — Z96651 Presence of right artificial knee joint: Secondary | ICD-10-CM | POA: Diagnosis not present

## 2019-11-22 DIAGNOSIS — Z9889 Other specified postprocedural states: Secondary | ICD-10-CM | POA: Diagnosis not present

## 2019-11-22 DIAGNOSIS — R262 Difficulty in walking, not elsewhere classified: Secondary | ICD-10-CM | POA: Diagnosis not present

## 2019-11-22 DIAGNOSIS — M6281 Muscle weakness (generalized): Secondary | ICD-10-CM

## 2019-11-22 DIAGNOSIS — R600 Localized edema: Secondary | ICD-10-CM

## 2019-11-22 NOTE — Therapy (Signed)
[place], [name], [address] Phone: [phone]   Fax:  [phone]  Physical Therapy Treatment / Discharge  Patient Details  Name: [name]. MRN: [id number] Date of Birth: 1961/03/13 Referring Provider (PT): Dr. [name]   Encounter Date: 11/22/2019  PT End of Session - 11/22/19 1218    Visit Number  24    Number of Visits  25    Date for PT Re-Evaluation  12/18/19    Authorization Type  [facility] UMR    PT Start Time  1215    PT Stop Time  1300    PT Time Calculation (min)  45 min    Activity Tolerance  Patient tolerated treatment well    Behavior During Therapy  [facility] for tasks assessed/performed       Past Medical History:  Diagnosis Date  . Arthritis   . Diabetes mellitus without complication ([place])    TYPE 2  . Diverticulosis   . Dyslipidemia   . GERD (gastroesophageal reflux disease)   . Herpes labialis   . Hypertension   . Longstanding persistent atrial fibrillation ([place])   . Metabolic syndrome   . Obesity   . Proteinuria   . Psoriasis   . Seborrheic dermatitis   . Sleep apnea    very compliant with CPAP, PT NEEDS TO BRING OWN MACHINE  . TIA (transient ischemic attack) 2009    Past Surgical History:  Procedure Laterality Date  . CARDIOVERSION N/A 12/02/2016   Procedure: CARDIOVERSION;  Surgeon: Pixie Casino, MD;  Location: [facility] ENDOSCOPY;  Service: Cardiovascular;  Laterality: N/A;  . COLONOSCOPY    . EXCISIONAL TOTAL KNEE ARTHROPLASTY WITH ANTIBIOTIC SPACERS Right 03/28/2019   Procedure: EXCISIONAL TOTAL KNEE ARTHROPLASTY WITH ANTIBIOTIC SPACERS;  Surgeon: [name], MD;  Location: [facility] ORS;  Service: Orthopedics;  Laterality: Right;  90 mins  . I & D KNEE WITH POLY EXCHANGE Right 09/10/2018   Procedure: Right Knee Arthroplasty IRRIGATION AND DEBRIDEMENT KNEE WITH POLY EXCHANGE;  Surgeon: [name], MD;  Location: [facility] ORS;  Service: Orthopedics;  Laterality: Right;  .  JOINT REPLACEMENT  11/03/10   LT HIP  . PFO occluder cardiac valve  2006   Dr. Einar Gip, (hole in heart)  . REIMPLANTATION OF TOTAL KNEE Right 06/15/2019   Procedure: Resection of antibiotic spacer and  irrigation and debridment and placement of new antibiotic spacer and components;  Surgeon: [name], MD;  Location: [facility] ORS;  Service: Orthopedics;  Laterality: Right;  90 mins  . REIMPLANTATION OF TOTAL KNEE Right 08/22/2019   Procedure: REIMPLANTATION OF TOTAL KNEE;  Surgeon: [name], MD;  Location: [facility] ORS;  Service: Orthopedics;  Laterality: Right;  120 mins  . TONSILLECTOMY AND ADENOIDECTOMY    . TOTAL HIP ARTHROPLASTY Left   . TOTAL KNEE ARTHROPLASTY Right 05/31/2018   Procedure: RIGHT TOTAL KNEE ARTHROPLASTY;  Surgeon: [name], MD;  Location: [facility] ORS;  Service: Orthopedics;  Laterality: Right;  70 mins  . TOTAL KNEE ARTHROPLASTY Left 06/28/2018   Procedure: LEFT TOTAL KNEE ARTHROPLASTY;  Surgeon: [name], MD;  Location: [facility] ORS;  Service: Orthopedics;  Laterality: Left;  70 mins  . WISDOM TOOTH EXTRACTION      There were no vitals filed for this visit.  Subjective Assessment - 11/22/19 1216    Subjective  Patient reports he is doing well. He feels like this will be his last visit. He saw his surgeon last week and states he was  happy.    Patient Stated Goals  Pt would like to be able to return to yardwork, tennis    Currently in Pain?  No/denies         [facility] PT Assessment - 11/22/19 0001      Assessment   Medical Diagnosis  Rt TKA     Referring Provider (PT)  Dr. 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    Leisure  [place], home projects, golf      Cognition   Overall Cognitive Status  Within Functional Limits for tasks assessed       Observation/Other Assessments   Focus on Therapeutic Outcomes (FOTO)   30% limitation      AROM   Right Knee Extension  2   lacking   Right Knee Flexion  118      Strength   Right Knee Flexion  5/5    Right Knee Extension  5/5    Left Knee Flexion  5/5    Left Knee Extension  5/5      Ambulation/Gait   Ambulation/Gait  Yes    Ambulation/Gait Assistance  7: Independent    Gait Pattern  Within Functional Limits                   [facility] Adult PT Treatment/Exercise - 11/22/19 0001      Exercises   Exercises  Knee/Hip   review, discuss, and demonstrate HEP     Knee/Hip Exercises: Stretches   Active Hamstring Stretch  --    Active Hamstring Stretch Limitations  --    Quad Stretch  --      Knee/Hip Exercises: Aerobic   Recumbent Bike  L3 x 6 min             PT Education - 11/22/19 1218    Education Details  HEP consistency    Person(s) Educated  Patient    Methods  Explanation    Comprehension  Verbalized understanding;Returned demonstration       PT Short Term Goals - 10/23/19 1321      PT SHORT TERM GOAL #1   Title  Pt will be I with initial HEP for Rt knee ROM and strength, balance    Time  4    Period  Weeks    Status  Achieved    Target Date  09/25/19      PT SHORT TERM GOAL #2   Title  Pt will be able to walk with/without least restricted assistive device in clinic with min noticeable limp, improved heel strike.    Time  4    Period  Weeks    Status  Achieved    Target Date  09/25/19      PT SHORT TERM GOAL #3   Title  Pt will be able to sit with knee flexed to 95 deg for improved sit to stand symmetry and ease    Time  6    Period  Weeks    Status  Achieved    Target Date  09/25/19      PT SHORT TERM GOAL #4   Title  Pt will be able to demo passive knee extension  to no more than 10 deg. to work towards near full extension and normal knee function.    Time  4    Period  Weeks    Status  Achieved    Target Date  09/25/19      PT  SHORT TERM GOAL #5   Title  Pt will understand RICE in order to minimize pain and swelling in RT LE    Time  8    Period  Weeks    Status  Achieved    Target Date  09/25/19        PT Long Term Goals - 11/22/19 1219      PT LONG TERM GOAL #1   Title  Pt will improve FOTO score to no more than 49 % limited to demo improved functional mobility.    Baseline  --    Time  8    Period  Weeks    Status  Achieved      PT LONG TERM GOAL #2   Title  Pt will be able to perform light recreational activites in the home and ADLs  with no more than min occasional difficulty    Time  8    Period  Weeks    Status  Achieved      PT LONG TERM GOAL #3   Title  Pt will be able to go up and down 12 stairs in his home with min difficulty, reciprocal and using 1 rail.    Time  8    Period  Weeks    Status  Achieved      PT LONG TERM GOAL #4   Title  Pt will demo 5/5 knee strength as well as hip abduction for normal, safe gait mechanics    Time  8    Period  Weeks    Status  Achieved      PT LONG TERM GOAL #5   Title  Pt will be able to extend knee AROM to lacking no more than 10 deg.    Time  8    Period  Weeks    Status  Achieved      PT LONG TERM GOAL #6   Title  Pt will be I with final HEP upong discharge for long term mobility and health.    Time  8    Period  Weeks    Status  Achieved            Plan - 11/22/19 1219    Clinical Impression Statement  Patient has achieved all established goals and is pleased with his progress. He was instructed to continued activity and exercise progression, and expectations for continued improvement. He has demonstrated improved ROM, strength, and function. He only notes slight difficulty walking down stairs or down hills. Formal PT is no longer indicated so patient will be discharged to independent HEP.    PT Treatment/Interventions  ADLs/Self Care Home Management;Stair training;Functional mobility training;Cryotherapy;Neuromuscular  re-education;Balance training;Therapeutic exercise;Therapeutic activities;Manual lymph drainage;Taping;Vasopneumatic Device;Manual techniques;Passive range of motion;Patient/family education;DME Instruction;Gait training;Electrical Stimulation    PT Home Exercise Plan  QS, towel slide flexion, SLR, sit to stand no UE, calf stretch, heel raises, seated or supine hamstring stretch, standing hip abduction and extension, seated hamstring curl    Consulted and Agree with Plan of Care  Patient       Patient will benefit from skilled therapeutic intervention in order to improve the following deficits and impairments:  Difficulty walking, Increased fascial restricitons, Decreased  range of motion, Pain, Decreased skin integrity, Decreased balance, Decreased scar mobility, Hypomobility, Impaired flexibility, Decreased strength, Decreased mobility, Increased edema, Postural dysfunction  Visit Diagnosis: S/P right knee arthroscopy  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 Chronic systolic CHF (congestive heart failure) ([place]) 09/27/2019  . S/P right TK rev 08/22/2019  . Status post revision of total knee, right 06/15/2019  . High risk medication use 11/08/2018  . S/P left TKA 06/28/2018  . S/P TKR (total knee replacement), right 05/31/2018  . Tubular adenoma of colon 10/18/2017  . 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 Permanent atrial fibrillation ([place]) 10/13/2016  . Health examination of defined subpopulation 11/15/2015  . Polycythemia vera ([place]) 10/09/2014  . Hyperlipidemia associated with type 2 diabetes mellitus ([place]) 09/17/2014  . Hypertension associated with diabetes ([place]) 09/17/2014  . Diabetes mellitus  ([place]) 10/31/2013  . Hx-TIA (transient ischemic attack) 09/05/2012  . GERD (gastroesophageal reflux disease) 08/17/2011  . Obesity (BMI 30-39.9) 08/17/2011  . Psoriatic arthritis ([place]) 08/17/2011  . Obstructive sleep apnea 08/05/2009    [facility] [address] [place], [name], [address] Phone: [phone]   Fax:  [phone]  Name: [name]. MRN: [id number] Date of Birth: Feb 01, 1961   PHYSICAL THERAPY DISCHARGE SUMMARY  Visits from Start of Care: 24  Current functional level related to goals / functional outcomes: See above   Remaining deficits: See above   Education / Equipment: HEP Plan: Patient agrees to discharge.  Patient goals were met. Patient is being discharged due to meeting the stated rehab goals.  ?????    Hilda Blades, PT, DPT, LAT, ATC 11/22/19  2:14 PM Phone: [phone] Fax: [phone]

## 2019-11-27 DIAGNOSIS — L400 Psoriasis vulgaris: Secondary | ICD-10-CM | POA: Diagnosis not present

## 2019-11-27 DIAGNOSIS — D2262 Melanocytic nevi of left upper limb, including shoulder: Secondary | ICD-10-CM | POA: Diagnosis not present

## 2019-11-27 DIAGNOSIS — D2261 Melanocytic nevi of right upper limb, including shoulder: Secondary | ICD-10-CM | POA: Diagnosis not present

## 2019-11-27 DIAGNOSIS — Z85828 Personal history of other malignant neoplasm of skin: Secondary | ICD-10-CM | POA: Diagnosis not present

## 2019-11-27 DIAGNOSIS — L814 Other melanin hyperpigmentation: Secondary | ICD-10-CM | POA: Diagnosis not present

## 2019-11-27 DIAGNOSIS — D225 Melanocytic nevi of trunk: Secondary | ICD-10-CM | POA: Diagnosis not present

## 2019-11-27 DIAGNOSIS — L720 Epidermal cyst: Secondary | ICD-10-CM | POA: Diagnosis not present

## 2019-11-27 MED FILL — CARVEDILOL 25 MG TABLET: 25 | 90 days supply | Qty: 180 | Fill #1

## 2019-11-29 MED FILL — TALTZ 80 MG/ML AUTOINJECTOR: 80 | 28 days supply | Qty: 1 | Fill #2

## 2019-12-01 ENCOUNTER — Other Ambulatory Visit: Payer: Self-pay | Admitting: Internal Medicine

## 2019-12-01 MED FILL — XARELTO 20 MG TABLET: 20 | 90 days supply | Qty: 90 | Fill #0

## 2019-12-01 NOTE — Telephone Encounter (Signed)
Pt last saw Dr Acie Fredrickson 09/27/19, last labs 08/23/19 Creat 0.98, age 59, weight 128.4kg, CrCl 145.58, based on CrCl pt is on appropriate dosage of Xarelto 20mg  QD.  Will refill rx.

## 2019-12-06 ENCOUNTER — Ambulatory Visit: Payer: 59 | Attending: Internal Medicine

## 2019-12-06 DIAGNOSIS — Z23 Encounter for immunization: Secondary | ICD-10-CM

## 2019-12-06 NOTE — Progress Notes (Signed)
   [id number]  Name:  Stanford Splitt.    MRN: [id number] DOB: [birth year]  12/06/2019  Mr. Tuffy was observed post Covid-19 immunization for 15 minutes without incident. He was provided with Vaccine Information Sheet and instruction to access the V-Safe system.   Mr. Stoops was instructed to call 911 with any severe reactions post vaccine: Marland Kitchen Difficulty breathing  . Swelling of face and throat  . A fast heartbeat  . A bad rash all over body  . Dizziness and weakness   Immunizations Administered    Name Date Dose VIS Date Route   Pfizer COVID-19 Vaccine 12/06/2019  9:07 AM 0.3 mL 08/11/2019 Intramuscular   Manufacturer: [place]   Lot: [id number]   [place]: [id number]

## 2019-12-11 MED FILL — DOXYCYCLINE HYCLATE 100 MG: 100 | 30 days supply | Qty: 60 | Fill #4

## 2019-12-12 DIAGNOSIS — G4733 Obstructive sleep apnea (adult) (pediatric): Secondary | ICD-10-CM | POA: Diagnosis not present

## 2019-12-19 ENCOUNTER — Other Ambulatory Visit: Payer: Self-pay | Admitting: Family Medicine

## 2019-12-19 DIAGNOSIS — E1121 Type 2 diabetes mellitus with diabetic nephropathy: Secondary | ICD-10-CM

## 2019-12-19 DIAGNOSIS — Z85828 Personal history of other malignant neoplasm of skin: Secondary | ICD-10-CM | POA: Diagnosis not present

## 2019-12-19 DIAGNOSIS — E1169 Type 2 diabetes mellitus with other specified complication: Secondary | ICD-10-CM

## 2019-12-19 DIAGNOSIS — L720 Epidermal cyst: Secondary | ICD-10-CM | POA: Diagnosis not present

## 2019-12-19 MED FILL — MUPIROCIN 2% OINTMENT: 2 | 10 days supply | Qty: 22 | Fill #0

## 2019-12-19 MED FILL — JARDIANCE 10 MG TABLET: 10 | 30 days supply | Qty: 30 | Fill #0

## 2019-12-19 MED FILL — ENTRESTO 24 MG-26 MG TABLET: 24-26 | 30 days supply | Qty: 60 | Fill #1

## 2019-12-19 MED FILL — ROSUVASTATIN CALCIUM 10 MG: 10 | 90 days supply | Qty: 90 | Fill #0

## 2019-12-22 HISTORY — PX: SKIN BIOPSY: SHX1

## 2019-12-26 MED FILL — TALTZ 80 MG/ML AUTOINJECTOR: 80 | 28 days supply | Qty: 1 | Fill #3

## 2019-12-29 ENCOUNTER — Encounter: Payer: Self-pay | Admitting: Family Medicine

## 2020-01-12 ENCOUNTER — Other Ambulatory Visit: Payer: Self-pay | Admitting: Family Medicine

## 2020-01-12 ENCOUNTER — Other Ambulatory Visit: Payer: Self-pay | Admitting: Internal Medicine

## 2020-01-12 ENCOUNTER — Telehealth: Payer: Self-pay

## 2020-01-12 ENCOUNTER — Other Ambulatory Visit: Payer: Self-pay

## 2020-01-12 DIAGNOSIS — K219 Gastro-esophageal reflux disease without esophagitis: Secondary | ICD-10-CM

## 2020-01-12 MED ORDER — PANTOPRAZOLE SODIUM 40 MG PO TBEC: 40.0000 mg | DELAYED_RELEASE_TABLET | Freq: Two times a day (BID) | ORAL | 3 refills | Status: DC

## 2020-01-12 NOTE — Telephone Encounter (Signed)
Pt. Called stating he needs a refill on his Pantoprazole 40 mg to the [facility] on [address] was 11/16/19.

## 2020-01-12 NOTE — Telephone Encounter (Signed)
Done KH 

## 2020-01-15 ENCOUNTER — Other Ambulatory Visit: Payer: Self-pay | Admitting: Family Medicine

## 2020-01-15 DIAGNOSIS — E1121 Type 2 diabetes mellitus with diabetic nephropathy: Secondary | ICD-10-CM

## 2020-01-15 MED FILL — JARDIANCE 10 MG TABLET: 10 | 90 days supply | Qty: 90 | Fill #0

## 2020-01-15 MED FILL — DOXYCYCLINE HYCLATE 100 MG: 100 | 30 days supply | Qty: 60 | Fill #5

## 2020-01-22 ENCOUNTER — Other Ambulatory Visit: Payer: Self-pay | Admitting: Family Medicine

## 2020-01-22 DIAGNOSIS — E1121 Type 2 diabetes mellitus with diabetic nephropathy: Secondary | ICD-10-CM

## 2020-01-22 MED FILL — ENTRESTO 24 MG-26 MG TABLET: 24-26 | 30 days supply | Qty: 60 | Fill #2

## 2020-01-22 MED FILL — TALTZ 80 MG/ML AUTOINJECTOR: 80 | 28 days supply | Qty: 1 | Fill #4

## 2020-02-12 MED FILL — DOXYCYCLINE HYCLATE 100 MG: 100 | 30 days supply | Qty: 60 | Fill #6

## 2020-02-13 ENCOUNTER — Ambulatory Visit (HOSPITAL_BASED_OUTPATIENT_CLINIC_OR_DEPARTMENT_OTHER): Payer: 59 | Admitting: Pharmacist

## 2020-02-13 ENCOUNTER — Other Ambulatory Visit: Payer: Self-pay

## 2020-02-13 DIAGNOSIS — Z79899 Other long term (current) drug therapy: Secondary | ICD-10-CM

## 2020-02-13 NOTE — Progress Notes (Signed)
S: Patient presents today for review of his specialty medication.  Patient is currently taking Taltz for psoriatric arthritis. Patient is managed by Dr. Elvera Lennox for this.  Adherence: denies any missed doses.  Efficacy: reports that it is still working very well for him.   Dosing:  SubQ:  80 mg every 4 weeks.  Drug-drug interactions: none  Screening: TB test: completed yearly, denies any positive test results Hepatitis: completed  Monitoring: S/sx of infection: has had a prosthetic joint infection. Donnetta Hail was held from ~03/2019 to end of 08/2019 to allow for complete recovery. Donnetta Hail was restarted 09/2019 and has been continued since.  CBC: see below S/sx of hypersensitivity: denies S/sx of malignancy: denies S/sx of heart failure: denies  O:  Lab Results  Component Value Date   WBC 11.0 (H) 08/23/2019   HGB 15.4 08/23/2019   HCT 45.4 08/23/2019   MCV 86.8 08/23/2019   PLT 144 (L) 08/23/2019      Chemistry      Component Value Date/Time   NA 134 (L) 08/23/2019 0409   NA 133 (L) 03/23/2019 0830   NA 138 05/22/2015 1303   K 4.1 08/23/2019 0409   K 4.1 05/22/2015 1303   CL 105 08/23/2019 0409   CO2 20 (L) 08/23/2019 0409   CO2 23 05/22/2015 1303   BUN 25 (H) 08/23/2019 0409   BUN 23 03/23/2019 0830   BUN 17.4 05/22/2015 1303   CREATININE 0.98 08/23/2019 0409   CREATININE 1.21 11/24/2016 0946   CREATININE 1.1 05/22/2015 1303      Component Value Date/Time   CALCIUM 8.7 (L) 08/23/2019 0409   CALCIUM 9.4 05/22/2015 1303   ALKPHOS 52 03/23/2019 0830   ALKPHOS 73 05/22/2015 1303   AST 24 03/23/2019 0830   AST 24 05/22/2015 1303   ALT 43 03/23/2019 0830   ALT 30 05/22/2015 1303   BILITOT 2.0 (H) 03/23/2019 0830   BILITOT 0.77 05/22/2015 1303       A/P: 1. Medication review: patient is on Taltz and tolerating it well without any adverse effects. He was in an original trial for Donnetta Hail and has continued on it and hopes to continue on it long term since it has  worked so well for him. Has had a prosthetic joint infection with interruption of Taltz therapy in the past but this has resolved. Donnetta Hail was restarted this year and the patient has been doing well since.  Reviewed the medication, including injection technique, adverse effects, and efficacy. No recommendations for changes.   Benard Halsted, PharmD, [place] [phone]

## 2020-02-26 MED FILL — TALTZ 80 MG/ML AUTOINJECTOR: 80 | 28 days supply | Qty: 1 | Fill #5

## 2020-02-26 MED FILL — ENTRESTO 24 MG-26 MG TABLET: 24-26 | 30 days supply | Qty: 60 | Fill #3

## 2020-02-26 MED FILL — CARVEDILOL 25 MG TABLET: 25 | 90 days supply | Qty: 180 | Fill #2

## 2020-02-26 MED FILL — XARELTO 20 MG TABLET: 20 | 90 days supply | Qty: 90 | Fill #1

## 2020-03-04 ENCOUNTER — Other Ambulatory Visit: Payer: Self-pay | Admitting: Internal Medicine

## 2020-03-05 ENCOUNTER — Other Ambulatory Visit: Payer: Self-pay | Admitting: Internal Medicine

## 2020-03-08 MED FILL — ROSUVASTATIN CALCIUM 10 MG: 10 | 90 days supply | Qty: 90 | Fill #1

## 2020-03-12 DIAGNOSIS — G4733 Obstructive sleep apnea (adult) (pediatric): Secondary | ICD-10-CM | POA: Diagnosis not present

## 2020-03-18 ENCOUNTER — Other Ambulatory Visit (HOSPITAL_COMMUNITY): Payer: Self-pay | Admitting: Orthopedic Surgery

## 2020-03-18 MED FILL — DOXYCYCLINE HYCLATE 100 MG: 100 | 30 days supply | Qty: 60 | Fill #0

## 2020-03-21 MED FILL — ENTRESTO 24 MG-26 MG TABLET: 24-26 | 30 days supply | Qty: 60 | Fill #4

## 2020-03-21 MED FILL — TALTZ 80 MG/ML AUTOINJECTOR: 80 | 28 days supply | Qty: 1 | Fill #6

## 2020-04-01 ENCOUNTER — Other Ambulatory Visit: Payer: Self-pay

## 2020-04-01 ENCOUNTER — Encounter: Payer: Self-pay | Admitting: Cardiovascular Disease

## 2020-04-01 ENCOUNTER — Ambulatory Visit: Payer: 59 | Admitting: Cardiovascular Disease

## 2020-04-01 VITALS — BP 112/72 | HR 62 | Ht 70.0 in | Wt 278.4 lb

## 2020-04-01 DIAGNOSIS — I152 Hypertension secondary to endocrine disorders: Secondary | ICD-10-CM

## 2020-04-01 DIAGNOSIS — I10 Essential (primary) hypertension: Secondary | ICD-10-CM | POA: Diagnosis not present

## 2020-04-01 DIAGNOSIS — E1159 Type 2 diabetes mellitus with other circulatory complications: Secondary | ICD-10-CM

## 2020-04-01 DIAGNOSIS — I5022 Chronic systolic (congestive) heart failure: Secondary | ICD-10-CM | POA: Diagnosis not present

## 2020-04-01 DIAGNOSIS — I4821 Permanent atrial fibrillation: Secondary | ICD-10-CM | POA: Diagnosis not present

## 2020-04-01 NOTE — Progress Notes (Signed)
Cardiology Office Note:    Date:  04/01/2020   ID:  William Barges., DOB 11/04/1960, MRN [id number]  PCP:  Denita Lung, MD  Cardiologist:  Acie Fredrickson   / [place]  Electrophysiologist:  None   Referring MD: Denita Lung, MD   1.   PFO closure 2.  Atrial fib 3.   Chronic systolic CHF   Chief Complaint  Patient presents with  . Atrial Fibrillation  . Congestive Heart Failure     Jan.  6, 2020    William Tinnell. is a 59 y.o. male with a hx of persistent atrial fibrillation, dyslipidemia, obstructive sleep apnea He has been seen in the past by Dr. [name].  He is seen currently by Dr. Thompson Grayer and was furred to me for general cardiology issues.  Hx of HTN, PFO - s/p occlusion device.   Hx of CHF  Hx of OSA    Seen with his wife , [name]  Has seen Dr. Einar Gip in the past  Has a PFO occlusion device following a TIA   Has had bilateral knee replacements ,  Both in Oct. 2019.   Dr. Rayann Heman increased his Coreg to 25  BID ,  Has some fatigue related to this increase  Tolerated the lower dose better .   Is on crutches today ,  Typically gets 7000-8000 steps a day   No CP, no dyspnea,  No sweats, no  PND.  No fever, rash, Sleeps well with CPAP.    Works as a Clinical biochemist   Oncologist and [place] )  His son played [place] with my son   Feb. 24, 2020:  [name] is seen today for follow-up of his atrial fibrillation, dyslipidemia struct of sleep apnea.  Has had bilateral knee replacement  Has has an infected right  knee joint ( MSSA ) .   Had a right knee poly exchange  Had 6 weeks of Ancef.     Is on Rifampin for 6 months  Is on Lovenox for now .   Does not want to do coumadin  Doesn't think he wants to go through rehab for his last knee surgery  Has been started on Jardiance. Has lost 50 lbs since Oct.   September 27, 2019: [name] is seen back today for his follow-up of his atrial fibrillation, dyslipidemia, and his chronic systolic congestive heart  failure.  Wt is 275 lbs .   Has continued to have issues with his infected right knee prosthesis  Has had 5 operations on his right knee.   Had his 3rd R TKA . He is feeling better.   Is back on xarelto   Still doing general contracting     April 01, 2020: [name] is seen today for follow-up of his atrial fibrillation, dyslipidemia, chronic systolic congestive heart failure. Weight is 278 pounds No complaints.  No CP, no dyspnea. Walks 7000 steps a day .  Prosthetic R knee issues - slowly improving.    Still has fluid around his knee.  Still on Abx.  Labs reveiewd  - LDL is great - 30 Trigs are 228. Is on a keto diet . Has lost 15 lbs recently .     Past Medical History:  Diagnosis Date  . Arthritis   . Diabetes mellitus without complication ([place])    TYPE 2  . Diverticulosis   . Dyslipidemia   . GERD (gastroesophageal reflux disease)   . Herpes labialis   .  Hypertension   . Longstanding persistent atrial fibrillation ([place])   . Metabolic syndrome   . Obesity   . Proteinuria   . Psoriasis   . Seborrheic dermatitis   . Sleep apnea    very compliant with CPAP, PT NEEDS TO BRING OWN MACHINE  . TIA (transient ischemic attack) 2009    Past Surgical History:  Procedure Laterality Date  . CARDIOVERSION N/A 12/02/2016   Procedure: CARDIOVERSION;  Surgeon: Pixie Casino, MD;  Location: [facility] ENDOSCOPY;  Service: Cardiovascular;  Laterality: N/A;  . COLONOSCOPY    . EXCISIONAL TOTAL KNEE ARTHROPLASTY WITH ANTIBIOTIC SPACERS Right 03/28/2019   Procedure: EXCISIONAL TOTAL KNEE ARTHROPLASTY WITH ANTIBIOTIC SPACERS;  Surgeon: [name], MD;  Location: [facility] ORS;  Service: Orthopedics;  Laterality: Right;  90 mins  . I & D KNEE WITH POLY EXCHANGE Right 09/10/2018   Procedure: Right Knee Arthroplasty IRRIGATION AND DEBRIDEMENT KNEE WITH POLY EXCHANGE;  Surgeon: [name], MD;  Location: [facility] ORS;  Service: Orthopedics;  Laterality: Right;  . JOINT REPLACEMENT  11/03/10   LT HIP  . PFO  occluder cardiac valve  2006   Dr. Einar Gip, (hole in heart)  . REIMPLANTATION OF TOTAL KNEE Right 06/15/2019   Procedure: Resection of antibiotic spacer and  irrigation and debridment and placement of new antibiotic spacer and components;  Surgeon: [name], MD;  Location: [facility] ORS;  Service: Orthopedics;  Laterality: Right;  90 mins  . REIMPLANTATION OF TOTAL KNEE Right 08/22/2019   Procedure: REIMPLANTATION OF TOTAL KNEE;  Surgeon: [name], MD;  Location: [facility] ORS;  Service: Orthopedics;  Laterality: Right;  120 mins  . SKIN BIOPSY Left 12/22/2019   epidermal inclusion cyst  . TONSILLECTOMY AND ADENOIDECTOMY    . TOTAL HIP ARTHROPLASTY Left   . TOTAL KNEE ARTHROPLASTY Right 05/31/2018   Procedure: RIGHT TOTAL KNEE ARTHROPLASTY;  Surgeon: [name], MD;  Location: [facility] ORS;  Service: Orthopedics;  Laterality: Right;  70 mins  . TOTAL KNEE ARTHROPLASTY Left 06/28/2018   Procedure: LEFT TOTAL KNEE ARTHROPLASTY;  Surgeon: [name], MD;  Location: [facility] ORS;  Service: Orthopedics;  Laterality: Left;  70 mins  . WISDOM TOOTH EXTRACTION      Current Medications: Current Meds  Medication Sig  . carvedilol (COREG) 25 MG tablet TAKE 1 TABLET BY MOUTH 2 TIMES DAILY.  Marland Kitchen doxycycline (VIBRAMYCIN) 100 MG capsule Take 100 mg by mouth 2 (two) times daily.  Marland Kitchen ENTRESTO 24-26 MG TAKE 1 TABLET BY MOUTH TWICE A DAY  . glucose blood test strip 1 each by Other route as needed. Use as instructed Freestyle test stripts  . glucose monitoring kit (FREESTYLE) monitoring kit 1 each by Does not apply route as needed.  . Ixekizumab (TALTZ) 80 MG/ML SOAJ Inject 80 mg into the skin every 28 (twenty-eight) days. Inject below the skin as directed; inject every 4 weeks.  Marland Kitchen JARDIANCE 10 MG TABS tablet TAKE 1 TABLET BY MOUTH ONCE DAILY  . Lancets (FREESTYLE) lancets 1 each by Other route as needed. Use as instructed  . Multiple Vitamin (MULTIVITAMIN WITH MINERALS) TABS tablet Take 1 tablet by mouth daily.  Marland Kitchen  oxymetazoline (AFRIN) 0.05 % nasal spray Place 1 spray into both nostrils at bedtime.   . pantoprazole (PROTONIX) 40 MG tablet Take 1 tablet (40 mg total) by mouth 2 (two) times daily.  . rosuvastatin (CRESTOR) 10 MG tablet TAKE 1 TABLET (10 MG TOTAL) BY MOUTH DAILY.  Marland Kitchen Vitamin D, Cholecalciferol, 1000 units CAPS Take 1,000 Units  by mouth daily.   Alveda Reasons 20 MG TABS tablet TAKE 1 TABLET BY MOUTH DAILY WITH SUPPER.     Allergies:   Morphine and related   Social History   Socioeconomic History  . Marital status: Married    Spouse name: Not on file  . Number of children: 2  . Years of education: Not on file  . Highest education level: Not on file  Occupational History  . Occupation: Theatre manager: [employer]  Tobacco Use  . Smoking status: Never Smoker  . Smokeless tobacco: Never Used  Vaping Use  . Vaping Use: Never used  Substance and Sexual Activity  . Alcohol use: Yes    Comment: 2/month  . Drug use: No  . Sexual activity: Yes  Other Topics Concern  . Not on file  Social History Narrative   Lives with spouse in [place]   Wife works for [employer] teaching program   Owns Advertising copywriter   Social Determinants of Health   Financial Resource Strain:   . Difficulty of Paying Living Expenses:   Food Insecurity:   . Worried About Charity fundraiser in the Last Year:   . Arboriculturist in the Last Year:   Transportation Needs:   . Film/video editor (Medical):   Marland Kitchen Lack of Transportation (Non-Medical):   Physical Activity:   . Days of Exercise per Week:   . Minutes of Exercise per Session:   Stress:   . Feeling of Stress :   Social Connections:   . Frequency of Communication with Friends and Family:   . Frequency of Social Gatherings with Friends and Family:   . Attends Religious Services:   . Active Member of Clubs or Organizations:   . Attends Archivist Meetings:   Marland Kitchen Marital Status:      Family History: The  patient's family history includes Cancer in an other family member; Crohn's disease in his mother; Esophageal cancer in his father; Obesity in an other family member; Uterine cancer (age of onset: 29) in his mother.  ROS:   Please see the history of present illness.     All other systems reviewed and are negative.  EKGs/Labs/Other Studies Reviewed:    The following studies were reviewed today:   EKG:     Recent Labs: 08/23/2019: BUN 25; Creatinine, Ser 0.98; Hemoglobin 15.4; Platelets 144; Potassium 4.1; Sodium 134  Recent Lipid Panel    Component Value Date/Time   CHOL 99 (L) 11/16/2019 1559   TRIG 228 (H) 11/16/2019 1559   HDL 34 (L) 11/16/2019 1559   CHOLHDL 2.9 11/16/2019 1559   CHOLHDL 4.1 10/13/2016 0956   VLDL 65 (H) 10/13/2016 0956   LDLCALC 30 11/16/2019 1559    Physical Exam:     Physical Exam: Blood pressure 112/72, pulse 62, height 5' 10"  (1.778 m), weight (!) 278 lb 6.4 oz (126.3 kg), SpO2 97 %.  GEN:   Middle age male,  Moderately obese  HEENT: Normal NECK: No JVD; No carotid bruits LYMPHATICS: No lymphadenopathy CARDIAC: Irreg  Irreg.   Rate is well controlled.  RESPIRATORY:  Clear to auscultation without rales, wheezing or rhonchi  ABDOMEN: Soft, non-tender, non-distended MUSCULOSKELETAL:  No edema; No deformity  SKIN: Warm and dry NEUROLOGIC:  Alert and oriented x 3    ASSESSMENT:    No diagnosis found. PLAN:    In order of problems listed above:  Chronic systolic congestive heart failure:  [name] is seen back for his chronic congestive heart failure.  Continue Coreg, Entresto.  Encouraged him to work on a better diet and weight loss program.  He is not having any signs of worsening heart failure.  2.  Atrial fibrillation: Currently on Xarelto.  Rate is well controlled.  He had several cardioversions but they were not successful.  Our plan is anticoagulation and rate control.   3.  Hyperlipidemia: Cholesterol levels are very well controlled.  His  triglycerides are elevated.  Is been on a keto diet.  But I encouraged him to work on weight loss program.   Medication Adjustments/Labs and Tests Ordered: Current medicines are reviewed at length with the patient today.  Concerns regarding medicines are outlined above.  No orders of the defined types were placed in this encounter.  No orders of the defined types were placed in this encounter.   Patient Instructions  Medication Instructions:  Your provider recommends that you continue on your current medications as directed. Please refer to the Current Medication list given to you today.   *If you need a refill on your cardiac medications before your next appointment, please call your pharmacy*  Lab Work: Your provider recommends that you return for FASTING lab work next year. If you have labs (blood work) drawn today and your tests are completely normal, you will receive your results only by: Marland Kitchen MyChart Message (if you have MyChart) OR . A paper copy in the mail If you have any lab test that is abnormal or we need to change your treatment, we will call you to review the results.  Follow-Up: At [facility], you and your health needs are our priority.  As part of our continuing mission to provide you with exceptional heart care, we have created designated Provider Care Teams.  These Care Teams include your primary Cardiologist (physician) and Advanced Practice Providers (APPs -  Physician Assistants and Nurse Practitioners) who all work together to provide you with the care you need, when you need it. Your next appointment:   12 month(s) The format for your next appointment:   In Person Provider:   You may see Mertie Moores, MD or one of the following Advanced Practice Providers on your designated Care Team:    Richardson Dopp, PA-C  Robbie Lis, [place]      Signed, Mertie Moores, MD  04/01/2020 8:22 AM    [place]

## 2020-04-01 NOTE — Patient Instructions (Signed)
Medication Instructions:  Your provider recommends that you continue on your current medications as directed. Please refer to the Current Medication list given to you today.   *If you need a refill on your cardiac medications before your next appointment, please call your pharmacy*  Lab Work: Your provider recommends that you return for FASTING lab work next year. If you have labs (blood work) drawn today and your tests are completely normal, you will receive your results only by: Marland Kitchen MyChart Message (if you have MyChart) OR . A paper copy in the mail If you have any lab test that is abnormal or we need to change your treatment, we will call you to review the results.  Follow-Up: At [facility], you and your health needs are our priority.  As part of our continuing mission to provide you with exceptional heart care, we have created designated Provider Care Teams.  These Care Teams include your primary Cardiologist (physician) and Advanced Practice Providers (APPs -  Physician Assistants and Nurse Practitioners) who all work together to provide you with the care you need, when you need it. Your next appointment:   12 month(s) The format for your next appointment:   In Person Provider:   You may see Mertie Moores, MD or one of the following Advanced Practice Providers on your designated Care Team:    Richardson Dopp, PA-C  [place], [place]

## 2020-04-15 ENCOUNTER — Other Ambulatory Visit: Payer: Self-pay | Admitting: Family Medicine

## 2020-04-15 ENCOUNTER — Encounter: Payer: Self-pay | Admitting: Family Medicine

## 2020-04-15 DIAGNOSIS — E1121 Type 2 diabetes mellitus with diabetic nephropathy: Secondary | ICD-10-CM

## 2020-04-15 MED FILL — DOXYCYCLINE HYCLATE 100 MG: 100 | 30 days supply | Qty: 60 | Fill #1

## 2020-04-15 MED FILL — JARDIANCE 10 MG TABLET: 10 | 90 days supply | Qty: 90 | Fill #0

## 2020-04-16 MED FILL — TALTZ 80 MG/ML AUTOINJECTOR: 80 | 28 days supply | Qty: 1 | Fill #7

## 2020-04-26 MED FILL — ENTRESTO 24 MG-26 MG TABLET: 24-26 | 30 days supply | Qty: 60 | Fill #5

## 2020-05-13 MED FILL — CARVEDILOL 25 MG TABLET: 25 | 90 days supply | Qty: 180 | Fill #0

## 2020-05-13 MED FILL — DOXYCYCLINE HYCLATE 100 MG: 100 | 30 days supply | Qty: 60 | Fill #2

## 2020-05-16 MED FILL — TALTZ 80 MG/ML AUTOINJECTOR: 80 | 28 days supply | Qty: 1 | Fill #8

## 2020-05-20 DIAGNOSIS — Z79899 Other long term (current) drug therapy: Secondary | ICD-10-CM | POA: Diagnosis not present

## 2020-05-20 DIAGNOSIS — L814 Other melanin hyperpigmentation: Secondary | ICD-10-CM | POA: Diagnosis not present

## 2020-05-20 DIAGNOSIS — Z85828 Personal history of other malignant neoplasm of skin: Secondary | ICD-10-CM | POA: Diagnosis not present

## 2020-05-20 DIAGNOSIS — D225 Melanocytic nevi of trunk: Secondary | ICD-10-CM | POA: Diagnosis not present

## 2020-05-20 DIAGNOSIS — L400 Psoriasis vulgaris: Secondary | ICD-10-CM | POA: Diagnosis not present

## 2020-05-22 DIAGNOSIS — L400 Psoriasis vulgaris: Secondary | ICD-10-CM | POA: Diagnosis not present

## 2020-05-24 MED FILL — FLUARIX QUADRIVALENT 0.5 ML: 0.5 | 1 days supply | Qty: 1 | Fill #0

## 2020-05-27 ENCOUNTER — Other Ambulatory Visit: Payer: Self-pay | Admitting: Cardiovascular Disease

## 2020-05-27 ENCOUNTER — Ambulatory Visit: Payer: 59 | Admitting: Family Medicine

## 2020-05-27 ENCOUNTER — Other Ambulatory Visit: Payer: Self-pay

## 2020-05-27 ENCOUNTER — Encounter: Payer: Self-pay | Admitting: Family Medicine

## 2020-05-27 VITALS — BP 114/70 | HR 64 | Temp 98.5°F | Wt 280.4 lb

## 2020-05-27 DIAGNOSIS — D45 Polycythemia vera: Secondary | ICD-10-CM | POA: Diagnosis not present

## 2020-05-27 DIAGNOSIS — Z8673 Personal history of transient ischemic attack (TIA), and cerebral infarction without residual deficits: Secondary | ICD-10-CM | POA: Diagnosis not present

## 2020-05-27 DIAGNOSIS — I4821 Permanent atrial fibrillation: Secondary | ICD-10-CM

## 2020-05-27 DIAGNOSIS — E669 Obesity, unspecified: Secondary | ICD-10-CM

## 2020-05-27 DIAGNOSIS — K429 Umbilical hernia without obstruction or gangrene: Secondary | ICD-10-CM

## 2020-05-27 DIAGNOSIS — Z79899 Other long term (current) drug therapy: Secondary | ICD-10-CM

## 2020-05-27 DIAGNOSIS — Z96651 Presence of right artificial knee joint: Secondary | ICD-10-CM

## 2020-05-27 DIAGNOSIS — I5022 Chronic systolic (congestive) heart failure: Secondary | ICD-10-CM

## 2020-05-27 DIAGNOSIS — K219 Gastro-esophageal reflux disease without esophagitis: Secondary | ICD-10-CM

## 2020-05-27 DIAGNOSIS — Z96652 Presence of left artificial knee joint: Secondary | ICD-10-CM

## 2020-05-27 DIAGNOSIS — D126 Benign neoplasm of colon, unspecified: Secondary | ICD-10-CM

## 2020-05-27 DIAGNOSIS — I10 Essential (primary) hypertension: Secondary | ICD-10-CM

## 2020-05-27 DIAGNOSIS — E1121 Type 2 diabetes mellitus with diabetic nephropathy: Secondary | ICD-10-CM

## 2020-05-27 DIAGNOSIS — I152 Hypertension secondary to endocrine disorders: Secondary | ICD-10-CM

## 2020-05-27 DIAGNOSIS — Z23 Encounter for immunization: Secondary | ICD-10-CM

## 2020-05-27 DIAGNOSIS — G4733 Obstructive sleep apnea (adult) (pediatric): Secondary | ICD-10-CM | POA: Diagnosis not present

## 2020-05-27 DIAGNOSIS — E1169 Type 2 diabetes mellitus with other specified complication: Secondary | ICD-10-CM

## 2020-05-27 DIAGNOSIS — L4050 Arthropathic psoriasis, unspecified: Secondary | ICD-10-CM | POA: Diagnosis not present

## 2020-05-27 DIAGNOSIS — E785 Hyperlipidemia, unspecified: Secondary | ICD-10-CM

## 2020-05-27 DIAGNOSIS — E1159 Type 2 diabetes mellitus with other circulatory complications: Secondary | ICD-10-CM | POA: Diagnosis not present

## 2020-05-27 LAB — POCT GLYCOSYLATED HEMOGLOBIN (HGB A1C): Hemoglobin A1C: 6 % — AB (ref 4.0–5.6)

## 2020-05-27 MED FILL — XARELTO 20 MG TABLET: 20 | 90 days supply | Qty: 90 | Fill #0

## 2020-05-27 MED FILL — ENTRESTO 24 MG-26 MG TABLET: 24-26 | 30 days supply | Qty: 60 | Fill #6

## 2020-05-27 NOTE — Telephone Encounter (Signed)
Prescription refill request for Xarelto received.  Indication:afib Last office visit:04/01/20 Weight:127.2 Age:59 Scr:0.98 CrCl:143 ml/min

## 2020-05-27 NOTE — Progress Notes (Signed)
Subjective:    Patient ID: William Barges., male    DOB: July 08, 1961, 59 y.o.   MRN: [id number]  William Mathenia. is a 59 y.o. male who presents for follow-up of Type 2 diabetes mellitus.  Home blood sugar records: meter does not record, fasting , 135- 170 Current symptoms/problems include none at this time. Daily foot checks:yes   Any foot concerns: none Exercise: walking Diet: fair He continues on Jardiance and having no difficulty with that medication.  He is also on Entresto and Coreg.  He is taking Crestor without aches or pains.  He continues on Protonix with good result.  He is considering several diet regimens to help with his weight but has not decided on anyone in particular.  He does have a question of lesion in his umbilical area.  He also notes difficulty with what he calls brain fog indicating he has had difficulty with remembering people's names.  He states that this is been going on for the last 5 years.  Does complain of nocturia however this is been going on for the last 5 years.  He does keep himself hydrated.  He continues on Xarelto for his underlying atrial fibrillation.  He does see cardiology regularly.  He continues on his CPAP.  He would like to get a new machine which I think he does qualify.  He also has a history of polycythemia and will need follow-up on that.  He does have underlying psoriatic arthritis and is on medication for that which seems to be helping.  He has routine follow-up for his tubular adenoma.  His knees seem to doing fairly well after multiple replacements on the right. The following portions of the patient's history were reviewed and updated as appropriate: allergies, current medications, past medical history, past social history and problem list.  ROS as in subjective above.     Objective:    Physical Exam Alert and in no distress small umbilical hernia is noted.  Cardiac exam shows an irregular rhythm.  Lungs are clear to auscultation.   Hemoglobin A1c is 6.0 Lab Review Diabetic Labs Latest Ref Rng & Units 05/27/2020 11/16/2019 08/23/2019 08/17/2019 07/24/2019  HbA1c 4.0 - 5.6 % 6.0(A) 6.3(A) - 6.4(H) -  Microalbumin mg/L - 100.3 - - -  Micro/Creat Ratio - - 349.5 - - -  Chol 100 - 199 mg/dL - 99(L) - - -  HDL >39 mg/dL - 34(L) - - -  Calc LDL 0 - 99 mg/dL - 30 - - -  Triglycerides 0 - 149 mg/dL - 228(H) - - -  Creatinine 0.61 - 1.24 mg/dL - - 0.98 1.17 1.06   BP/Weight 05/27/2020 04/01/2020 11/16/2019 10/23/2019 0/27/2536  Systolic BP 644 034 742 595 638  Diastolic BP 70 72 78 80 90  Wt. (Lbs) 280.4 278.4 283 281.4 275  BMI 40.23 39.95 40.61 41.56 40.61   Foot/eye exam completion dates Latest Ref Rng & Units 07/11/2019 10/20/2018  Eye Exam No Retinopathy No Retinopathy -  Foot Form Completion - - Done  Hemoglobin A1c is 6.0. Yoshiharu  reports that he has never smoked. He has never used smokeless tobacco. He reports current alcohol use. He reports that he does not use drugs.     Assessment & Plan:    Type 2 diabetes mellitus with diabetic nephropathy, without long-term current use of insulin ([place]) - Plan: POCT glycosylated hemoglobin (Hb A1C), CBC with Differential/Platelet, Comprehensive metabolic panel, CANCELED: POCT UA - Microalbumin  Hx-TIA (transient  ischemic attack)  Obstructive sleep apnea  Gastroesophageal reflux disease without esophagitis  Obesity (BMI 30-39.9)  Psoriatic arthritis ([place])  Hyperlipidemia associated with type 2 diabetes mellitus ([place])  Hypertension associated with diabetes ([place])  Polycythemia vera ([place]) - Plan: Ferritin, Transferrin  Diabetic nephropathy associated with type 2 diabetes mellitus ([place])  Permanent atrial fibrillation ([facility])  Tubular adenoma of colon  S/P TKR (total knee replacement), right  S/P left TKA  High risk medication use  Chronic systolic CHF (congestive heart failure) ([facility])  Immunization, viral disease - Plan: Pfizer SARS-COV-2 Vaccine  Need for  shingles vaccine - Plan: Varicella-zoster vaccine IM (Shingrix)  Umbilical hernia without obstruction and without gangrene   1. Rx changes: none 2. Education: Reviewed 'ABCs' of diabetes management (respective goals in parentheses):  A1C (<7), blood pressure (<130/80), and cholesterol (LDL <100). 3. Compliance at present is estimated to be good. Efforts to improve compliance (if necessary) will be directed at increased exercise. 4. Follow up: 6 months Did recommend he cut back on fluids at night to help with his nocturia. Prescription written for new CPAP machine. MMSE was not done but we will set that up as he left before we could get it scheduled. I explained that the umbilical hernia is of no great concern. Continue on present medication regimen. I then discussed several options concerning weight reduction.  Discussed the [place], going to medical weight loss and wellness, [facility].  He will keep me informed as to which one he wants.

## 2020-05-28 LAB — COMPREHENSIVE METABOLIC PANEL
ALT: 33 IU/L (ref 0–44)
AST: 23 IU/L (ref 0–40)
Albumin/Globulin Ratio: 2 (ref 1.2–2.2)
Albumin: 4.6 g/dL (ref 3.8–4.9)
Alkaline Phosphatase: 59 IU/L (ref 44–121)
BUN/Creatinine Ratio: 18 (ref 10–24)
BUN: 22 mg/dL (ref 8–27)
Bilirubin Total: 1.1 mg/dL (ref 0.0–1.2)
CO2: 20 mmol/L (ref 20–29)
Calcium: 10 mg/dL (ref 8.6–10.2)
Chloride: 102 mmol/L (ref 96–106)
Creatinine, Ser: 1.22 mg/dL (ref 0.76–1.27)
GFR calc Af Amer: 74 mL/min/{1.73_m2} (ref >59)
GFR calc non Af Amer: 64 mL/min/{1.73_m2} (ref >59)
Globulin, Total: 2.3 g/dL (ref 1.5–4.5)
Glucose: 171 mg/dL — ABNORMAL HIGH (ref 65–99)
Potassium: 4.4 mmol/L (ref 3.5–5.2)
Sodium: 141 mmol/L (ref 134–144)
Total Protein: 6.9 g/dL (ref 6.0–8.5)

## 2020-05-28 LAB — CBC WITH DIFFERENTIAL/PLATELET
Basophils Absolute: 0.1 10*3/uL (ref 0.0–0.2)
Basos: 1 %
EOS (ABSOLUTE): 0.2 10*3/uL (ref 0.0–0.4)
Eos: 2 %
Hematocrit: 59.7 % — ABNORMAL HIGH (ref 37.5–51.0)
Hemoglobin: 21.4 g/dL (ref 13.0–17.7)
Immature Grans (Abs): 0.1 10*3/uL (ref 0.0–0.1)
Immature Granulocytes: 1 %
Lymphocytes Absolute: 1.6 10*3/uL (ref 0.7–3.1)
Lymphs: 23 %
MCH: 33.5 pg — ABNORMAL HIGH (ref 26.6–33.0)
MCHC: 35.8 g/dL — ABNORMAL HIGH (ref 31.5–35.7)
MCV: 94 fL (ref 79–97)
Monocytes Absolute: 0.6 10*3/uL (ref 0.1–0.9)
Monocytes: 8 %
Neutrophils Absolute: 4.5 10*3/uL (ref 1.4–7.0)
Neutrophils: 65 %
Platelets: 137 10*3/uL — ABNORMAL LOW (ref 150–450)
RBC: 6.38 x10E6/uL — ABNORMAL HIGH (ref 4.14–5.80)
RDW: 14.8 % (ref 11.6–15.4)
WBC: 6.9 10*3/uL (ref 3.4–10.8)

## 2020-05-28 LAB — TRANSFERRIN: Transferrin: 295 mg/dL (ref 177–329)

## 2020-05-28 LAB — FERRITIN: Ferritin: 233 ng/mL (ref 30–400)

## 2020-05-29 ENCOUNTER — Other Ambulatory Visit: Payer: Self-pay | Admitting: Medical Oncology

## 2020-05-29 ENCOUNTER — Telehealth: Payer: Self-pay | Admitting: Physician Assistant

## 2020-05-29 DIAGNOSIS — D45 Polycythemia vera: Secondary | ICD-10-CM

## 2020-05-29 NOTE — Telephone Encounter (Signed)
Scheduled appt per 9/28 sch msg - pt is aware of appt date and time

## 2020-06-02 NOTE — Progress Notes (Signed)
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 Negative MPL515 mutation, Negative BCR/ABL and negative JAK-2 exon 12.  PRIOR THERAPY: None  CURRENT THERAPY: Phlebotomy on as-needed basis.  INTERVAL HISTORY: Cephus Tupy. 59 y.o. male returns to the clinic today for a follow-up visit.  The patient was last seen by [place] in 2017.  At that time, Dr. Julien Nordmann discussed with the patient can be seen for therapeutic phlebotomy on an as-needed basis if his hematocrit is greater than 50%.  Since that time, he has been having routine blood work performed by his primary care provider.  The patient recently had a visit with his PCP on 05/27/2020 in which his hematocrit was 59.7%. He was referred back here for consideration of a therapeutic phlebotomy. His next scheduled follow up visit with his PCP is in 6 months.  Overall the patient is feeling fine. In the interval since we saw him, he has had 5 knee replacements on his right knee. He continues to take antibiotics.  Denies any significant fatigue besides his baseline.  Denies any abnormal bleeding or bruising. He did receive blood transfusions with some of his knee replacement surgeries. Denies any flushing, although he appears flushed.  Denies any pruritus.  Denies any abdominal discomfort or fullness.  Denies any headache, tinnitus, dizziness, or paresthesias.  Denies any chest discomfort.  The patient is here today for evaluation, repeat blood work, and consideration of a therapeutic phlebotomy.   MEDICAL HISTORY: Past Medical History:  Diagnosis Date  . Arthritis   . Diabetes mellitus without complication ([place])    TYPE 2  . Diverticulosis   . Dyslipidemia   . GERD (gastroesophageal reflux disease)   . Herpes labialis   . Hypertension   . Longstanding persistent atrial  fibrillation ([place])   . Metabolic syndrome   . Obesity   . Proteinuria   . Psoriasis   . Seborrheic dermatitis   . Sleep apnea    very compliant with CPAP, PT NEEDS TO BRING OWN MACHINE  . TIA (transient ischemic attack) 2009    ALLERGIES:  is allergic to morphine and related.  MEDICATIONS:  Current Outpatient Medications  Medication Sig Dispense Refill  . carvedilol (COREG) 25 MG tablet TAKE 1 TABLET BY MOUTH 2 TIMES DAILY. 180 tablet 2  . doxycycline (VIBRAMYCIN) 100 MG capsule Take 100 mg by mouth 2 (two) times daily.    Marland Kitchen ENTRESTO 24-26 MG TAKE 1 TABLET BY MOUTH TWICE A DAY 60 tablet 8  . glucose blood test strip 1 each by Other route as needed. Use as instructed Freestyle test stripts 100 each 3  . glucose monitoring kit (FREESTYLE) monitoring kit 1 each by Does not apply route as needed. 1 each 0  . Ixekizumab (TALTZ) 80 MG/ML SOAJ Inject 80 mg into the skin every 28 (twenty-eight) days. Inject below the skin as directed; inject every 4 weeks. 1 mL 11  . JARDIANCE 10 MG TABS tablet TAKE 1 TABLET BY MOUTH ONCE DAILY 90 tablet 0  . Lancets (FREESTYLE) lancets 1 each by Other route as needed. Use as instructed 100 each 3  . Multiple Vitamin (MULTIVITAMIN WITH MINERALS) TABS tablet Take 1 tablet by mouth daily.    Marland Kitchen oxymetazoline (AFRIN) 0.05 % nasal spray Place 1 spray into both nostrils at bedtime.     . pantoprazole (PROTONIX) 40 MG tablet  Take 1 tablet (40 mg total) by mouth 2 (two) times daily. 270 tablet 3  . rosuvastatin (CRESTOR) 10 MG tablet TAKE 1 TABLET (10 MG TOTAL) BY MOUTH DAILY. 90 tablet 2  . Vitamin D, Cholecalciferol, 1000 units CAPS Take 1,000 Units by mouth daily.     Alveda Reasons 20 MG TABS tablet TAKE 1 TABLET BY MOUTH DAILY WITH SUPPER. 90 tablet 1   No current facility-administered medications for this visit.    SURGICAL HISTORY:  Past Surgical History:  Procedure Laterality Date  . CARDIOVERSION N/A 12/02/2016   Procedure: CARDIOVERSION;  Surgeon: Pixie Casino, MD;  Location: [facility] ENDOSCOPY;  Service: Cardiovascular;  Laterality: N/A;  . COLONOSCOPY    . EXCISIONAL TOTAL KNEE ARTHROPLASTY WITH ANTIBIOTIC SPACERS Right 03/28/2019   Procedure: EXCISIONAL TOTAL KNEE ARTHROPLASTY WITH ANTIBIOTIC SPACERS;  Surgeon: [name], MD;  Location: [facility] ORS;  Service: Orthopedics;  Laterality: Right;  90 mins  . I & D KNEE WITH POLY EXCHANGE Right 09/10/2018   Procedure: Right Knee Arthroplasty IRRIGATION AND DEBRIDEMENT KNEE WITH POLY EXCHANGE;  Surgeon: [name], MD;  Location: [facility] ORS;  Service: Orthopedics;  Laterality: Right;  . JOINT REPLACEMENT  11/03/10   LT HIP  . PFO occluder cardiac valve  2006   Dr. Einar Gip, (hole in heart)  . REIMPLANTATION OF TOTAL KNEE Right 06/15/2019   Procedure: Resection of antibiotic spacer and  irrigation and debridment and placement of new antibiotic spacer and components;  Surgeon: [name], MD;  Location: [facility] ORS;  Service: Orthopedics;  Laterality: Right;  90 mins  . REIMPLANTATION OF TOTAL KNEE Right 08/22/2019   Procedure: REIMPLANTATION OF TOTAL KNEE;  Surgeon: [name], MD;  Location: [facility] ORS;  Service: Orthopedics;  Laterality: Right;  120 mins  . SKIN BIOPSY Left 12/22/2019   epidermal inclusion cyst  . TONSILLECTOMY AND ADENOIDECTOMY    . TOTAL HIP ARTHROPLASTY Left   . TOTAL KNEE ARTHROPLASTY Right 05/31/2018   Procedure: RIGHT TOTAL KNEE ARTHROPLASTY;  Surgeon: [name], MD;  Location: [facility] ORS;  Service: Orthopedics;  Laterality: Right;  70 mins  . TOTAL KNEE ARTHROPLASTY Left 06/28/2018   Procedure: LEFT TOTAL KNEE ARTHROPLASTY;  Surgeon: [name], MD;  Location: [facility] ORS;  Service: Orthopedics;  Laterality: Left;  70 mins  . WISDOM TOOTH EXTRACTION      REVIEW OF SYSTEMS:   Review of Systems  Constitutional: Positive for baseline fatigue. Negative for appetite change, chills, fever and unexpected weight change.  HENT: Negative for mouth sores, nosebleeds, sore throat and trouble  swallowing.   Eyes: Negative for eye problems and icterus.  Respiratory: Negative for cough, hemoptysis, shortness of breath and wheezing.   Cardiovascular: Negative for chest pain and leg swelling.  Gastrointestinal: Negative for abdominal pain, constipation, diarrhea, nausea and vomiting.  Genitourinary: Negative for bladder incontinence, difficulty urinating, dysuria, frequency and hematuria.   Musculoskeletal: Negative for back pain, gait problem, neck pain and neck stiffness.  Skin: Negative for itching and rash.  Neurological: Negative for dizziness, extremity weakness, gait problem, headaches, light-headedness and seizures.  Hematological: Negative for adenopathy. Does not bruise/bleed easily.  Psychiatric/Behavioral: Negative for confusion, depression and sleep disturbance. The patient is not nervous/anxious.     PHYSICAL EXAMINATION:  Blood pressure (!) 137/99, pulse 91, temperature 98.9 F (37.2 C), temperature source Tympanic, resp. rate 18, height _0  (1.778 m), weight 287 lb 3.2 oz (130.3 kg), SpO2 98 %.  ECOG PERFORMANCE STATUS: 1 - Symptomatic but completely ambulatory  Physical Exam  Constitutional: Oriented to person, place, and time and well-developed, well-nourished, and in no distress.  HENT:  Head: Normocephalic and atraumatic.  Mouth/Throat: Oropharynx is clear and moist. No oropharyngeal exudate.  Eyes: Conjunctivae are normal. Right eye exhibits no discharge. Left eye exhibits no discharge. No scleral icterus.  Neck: Normal range of motion. Neck supple.  Cardiovascular: Normal rate, regular rhythm, normal heart sounds and intact distal pulses.   Pulmonary/Chest: Effort normal and breath sounds normal. No respiratory distress. No wheezes. No rales.  Abdominal: Soft. Bowel sounds are normal. Exhibits no distension and no mass. There is no tenderness.  Musculoskeletal: Normal range of motion. Exhibits no edema.  Lymphadenopathy:    No cervical adenopathy.   Neurological: Alert and oriented to person, place, and time. Exhibits normal muscle tone. Gait normal. Coordination normal.  Skin: Positive for flushing. Skin is warm and dry. No rash noted. Not diaphoretic. No pallor.  Psychiatric: Mood, memory and judgment normal.  Vitals reviewed.  LABORATORY DATA: Lab Results  Component Value Date   WBC 7.5 06/04/2020   HGB 20.1 (H) 06/04/2020   HCT 55.5 (H) 06/04/2020   MCV 87.5 06/04/2020   PLT 155 06/04/2020      Chemistry      Component Value Date/Time   NA 141 05/27/2020 0916   NA 138 05/22/2015 1303   K 4.4 05/27/2020 0916   K 4.1 05/22/2015 1303   CL 102 05/27/2020 0916   CO2 20 05/27/2020 0916   CO2 23 05/22/2015 1303   BUN 22 05/27/2020 0916   BUN 17.4 05/22/2015 1303   CREATININE 1.22 05/27/2020 0916   CREATININE 1.21 11/24/2016 0946   CREATININE 1.1 05/22/2015 1303      Component Value Date/Time   CALCIUM 10.0 05/27/2020 0916   CALCIUM 9.4 05/22/2015 1303   ALKPHOS 59 05/27/2020 0916   ALKPHOS 73 05/22/2015 1303   AST 23 05/27/2020 0916   AST 24 05/22/2015 1303   ALT 33 05/27/2020 0916   ALT 30 05/22/2015 1303   BILITOT 1.1 05/27/2020 0916   BILITOT 0.77 05/22/2015 1303       RADIOGRAPHIC STUDIES:  No results found.   ASSESSMENT/PLAN:  This is a very pleasant 59 year old Caucasian male with polycythemia, highly suspicious for polycythemia vera but with a negative JAK2 mutation.  The patient receives therapeutic phlebotomies on an as-needed basis.  He continues to follow with his PCP for routine blood work.  The patient was seen with Dr. Julien Nordmann. The patient had a repeat CBC today which noted a hematocrit at 55%.  We will arrange for the patient to have a therapeutic phlebotomy today for 1 unit.  The patient is not planning to see his PCP for another 6 months, therefore, we will see the patient back for a follow-up in 2-3 months for evaluation and consideration of another therapeutic phlebotomy.  The patient  was advised to call immediately if he has any concerning symptoms in the interval. The patient voices understanding of current disease status and treatment options and is in agreement with the current care plan. All questions were answered. The patient knows to call the clinic with any problems, questions or concerns. We can certainly see the patient much sooner if necessary    Orders Placed This Encounter  Procedures  . CBC with Differential (Cancer Center Only)    Standing Status:   Future    Standing Expiration Date:   06/04/2021     Laiza Veenstra L Takira Sherrin, PA-C 06/04/20  ADDENDUM:  Hematology/Oncology Attending: I had a face-to-face encounter with the patient today.  I recommended his care plan.  This is a very pleasant 59 years old white male with history of polycythemia suspicious for polycythemia vera but he has negative JAK2 mutation. The patient was lost to follow-up for few years.  He was seen recently by his primary care physician Dr. Redmond School and was found to have significant elevation in his hemoglobin and hematocrit.  Repeat CBC today showed hematocrit of 55%. We will arrange for the patient to receive phlebotomy today. We will see him back for follow-up visit in 2-3 months for evaluation with repeat CBC and phlebotomy if needed. He was advised to call immediately if he has any concerning symptoms in the interval.  Disclaimer: This note was dictated with voice recognition software. Similar sounding words can inadvertently be transcribed and may be missed upon review. Eilleen Kempf, MD 06/04/20

## 2020-06-04 ENCOUNTER — Encounter: Payer: Self-pay | Admitting: Physician Assistant

## 2020-06-04 ENCOUNTER — Inpatient Hospital Stay: Payer: 59

## 2020-06-04 ENCOUNTER — Inpatient Hospital Stay: Payer: 59 | Attending: Physician Assistant

## 2020-06-04 ENCOUNTER — Inpatient Hospital Stay (HOSPITAL_BASED_OUTPATIENT_CLINIC_OR_DEPARTMENT_OTHER): Payer: 59 | Admitting: Physician Assistant

## 2020-06-04 ENCOUNTER — Other Ambulatory Visit: Payer: Self-pay

## 2020-06-04 VITALS — BP 117/96 | HR 80 | Resp 18

## 2020-06-04 VITALS — BP 137/99 | HR 91 | Temp 98.9°F | Resp 18 | Ht 70.0 in | Wt 287.2 lb

## 2020-06-04 DIAGNOSIS — D45 Polycythemia vera: Secondary | ICD-10-CM

## 2020-06-04 DIAGNOSIS — D751 Secondary polycythemia: Secondary | ICD-10-CM | POA: Insufficient documentation

## 2020-06-04 DIAGNOSIS — Z96651 Presence of right artificial knee joint: Secondary | ICD-10-CM | POA: Insufficient documentation

## 2020-06-04 LAB — CBC WITH DIFFERENTIAL (CANCER CENTER ONLY)
Abs Immature Granulocytes: 0.08 10*3/uL — ABNORMAL HIGH (ref 0.00–0.07)
Basophils Absolute: 0.1 10*3/uL (ref 0.0–0.1)
Basophils Relative: 1 %
Eosinophils Absolute: 0.2 10*3/uL (ref 0.0–0.5)
Eosinophils Relative: 2 %
HCT: 55.5 % — ABNORMAL HIGH (ref 39.0–52.0)
Hemoglobin: 20.1 g/dL — ABNORMAL HIGH (ref 13.0–17.0)
Immature Granulocytes: 1 %
Lymphocytes Relative: 30 %
Lymphs Abs: 2.2 10*3/uL (ref 0.7–4.0)
MCH: 31.7 pg (ref 26.0–34.0)
MCHC: 36.2 g/dL — ABNORMAL HIGH (ref 30.0–36.0)
MCV: 87.5 fL (ref 80.0–100.0)
Monocytes Absolute: 0.6 10*3/uL (ref 0.1–1.0)
Monocytes Relative: 9 %
Neutro Abs: 4.3 10*3/uL (ref 1.7–7.7)
Neutrophils Relative %: 57 %
Platelet Count: 155 10*3/uL (ref 150–400)
RBC: 6.34 MIL/uL — ABNORMAL HIGH (ref 4.22–5.81)
RDW: 13.2 % (ref 11.5–15.5)
WBC Count: 7.5 10*3/uL (ref 4.0–10.5)
nRBC: 0 % (ref 0.0–0.2)

## 2020-06-04 NOTE — Patient Instructions (Signed)

## 2020-06-13 MED FILL — ROSUVASTATIN CALCIUM 10 MG: 10 | 90 days supply | Qty: 90 | Fill #2

## 2020-06-13 MED FILL — DOXYCYCLINE HYCLATE 100 MG: 100 | 30 days supply | Qty: 60 | Fill #3

## 2020-06-14 DIAGNOSIS — G4733 Obstructive sleep apnea (adult) (pediatric): Secondary | ICD-10-CM | POA: Diagnosis not present

## 2020-06-19 ENCOUNTER — Other Ambulatory Visit: Payer: Self-pay

## 2020-06-19 ENCOUNTER — Other Ambulatory Visit (HOSPITAL_COMMUNITY): Payer: Self-pay | Admitting: Orthopedic Surgery

## 2020-06-19 ENCOUNTER — Ambulatory Visit: Payer: 59 | Admitting: Internal Medicine

## 2020-06-19 DIAGNOSIS — T8459XD Infection and inflammatory reaction due to other internal joint prosthesis, subsequent encounter: Secondary | ICD-10-CM | POA: Diagnosis not present

## 2020-06-19 DIAGNOSIS — M25561 Pain in right knee: Secondary | ICD-10-CM | POA: Diagnosis not present

## 2020-06-19 DIAGNOSIS — Z96659 Presence of unspecified artificial knee joint: Secondary | ICD-10-CM | POA: Diagnosis not present

## 2020-06-19 DIAGNOSIS — Z96651 Presence of right artificial knee joint: Secondary | ICD-10-CM | POA: Diagnosis not present

## 2020-06-19 MED FILL — CEPHALEXIN 500 MG CAPSULE: 500 | 14 days supply | Qty: 56 | Fill #0

## 2020-06-19 NOTE — Progress Notes (Signed)
[facility]  Patient Active Problem List   Diagnosis Date Noted  . 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 S/P TKR (total knee replacement), right 05/31/2018    Priority: High  . Chronic systolic CHF (congestive heart failure) ([place]) 09/27/2019  . High risk medication use 11/08/2018  . S/P left TKA 06/28/2018  . Tubular adenoma of colon 10/18/2017  . 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 Diabetic nephropathy associated with type 2 diabetes mellitus ([place]) 10/13/2016  . Permanent atrial fibrillation ([place]) 10/13/2016  . Health examination of defined subpopulation 11/15/2015  . Polycythemia vera ([place]) 10/09/2014  . Hyperlipidemia associated with type 2 diabetes mellitus ([place]) 09/17/2014  . Hypertension associated with diabetes ([place]) 09/17/2014  . Diabetes mellitus ([place]) 10/31/2013  . Hx-TIA (transient ischemic attack) 09/05/2012  . GERD (gastroesophageal reflux disease) 08/17/2011  . Obesity (BMI 30-39.9) 08/17/2011  . Psoriatic arthritis ([place]) 08/17/2011  . Obstructive sleep apnea 08/05/2009    Patient's Medications  New Prescriptions   No medications on file  Previous Medications   CARVEDILOL (COREG) 25 MG TABLET    TAKE 1 TABLET BY MOUTH 2 TIMES DAILY.   DOXYCYCLINE (VIBRAMYCIN) 100 MG CAPSULE    Take 100 mg by mouth 2 (two) times daily.   ENTRESTO 24-26 MG    TAKE 1 TABLET BY MOUTH TWICE A DAY   GLUCOSE BLOOD TEST STRIP    1 each by Other route as needed. Use as instructed Freestyle test stripts   GLUCOSE MONITORING KIT (FREESTYLE) MONITORING KIT    1 each by Does not apply route as needed.   IXEKIZUMAB (TALTZ) 80 MG/ML SOAJ    Inject 80 mg into the skin every 28 (twenty-eight) days. Inject below the skin as directed; inject every 4 weeks.   JARDIANCE 10 MG TABS TABLET    TAKE 1 TABLET BY MOUTH ONCE DAILY   LANCETS (FREESTYLE) LANCETS    1 each by Other route as needed. Use as instructed    MULTIPLE VITAMIN (MULTIVITAMIN WITH MINERALS) TABS TABLET    Take 1 tablet by mouth daily.   OXYMETAZOLINE (AFRIN) 0.05 % NASAL SPRAY    Place 1 spray into both nostrils at bedtime.    PANTOPRAZOLE (PROTONIX) 40 MG TABLET    Take 1 tablet (40 mg total) by mouth 2 (two) times daily.   ROSUVASTATIN (CRESTOR) 10 MG TABLET    TAKE 1 TABLET (10 MG TOTAL) BY MOUTH DAILY.   VITAMIN D, CHOLECALCIFEROL, 1000 UNITS CAPS    Take 1,000 Units by mouth daily.    XARELTO 20 MG TABS TABLET    TAKE 1 TABLET BY MOUTH DAILY WITH SUPPER.  Modified Medications   No medications on file  Discontinued Medications   No medications on file    Subjective: [name] is seen on a working basis.  He has a history of degenerative joint disease.  He underwent bilateral total knee arthroplasties in October 2019.  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  Operative cultures were positive for MSSA.  He was treated with IV cefazolin and rifampin for 6 weeks before converting to oral cephalexin and rifampin.  He completed 6 months of therapy on 03/16/2019 and relapsed within 1 week.  He underwent resection arthroplasty with spacer placement on 03/28/2019.  Operative cultures were negative.  He was treated with  IV cefazolin but was switched to daptomycin after developing stomach upset consult cefazolin.  He required repeat incision and drainage and had his pacer replaced on 06/16/2019.  Operative cultures were again positive for MSSA.  He completed IV daptomycin therapy on 07/20/2019.  He underwent redo total knee arthroplasty on 08/23/2019.  He has been on doxycycline ever since.  He was doing well until 3 days ago when he developed chills, diffuse myalgias and arthralgias.  Yesterday his right knee became swollen, red, warm and moderately painful.  In the past month he has received his Covid booster vaccine, influenza vaccine and shingles vaccine.  Review  of Systems: Review of Systems  Constitutional: Positive for chills and malaise/fatigue. Negative for diaphoresis, fever and weight loss.  Musculoskeletal: Positive for back pain, joint pain, myalgias and neck pain.    Past Medical History:  Diagnosis Date  . Arthritis   . Diabetes mellitus without complication ([place])    TYPE 2  . Diverticulosis   . Dyslipidemia   . GERD (gastroesophageal reflux disease)   . Herpes labialis   . Hypertension   . Longstanding persistent atrial fibrillation ([place])   . Metabolic syndrome   . Obesity   . Proteinuria   . Psoriasis   . Seborrheic dermatitis   . Sleep apnea    very compliant with CPAP, PT NEEDS TO BRING OWN MACHINE  . TIA (transient ischemic attack) 2009    Social History   Tobacco Use  . Smoking status: Never Smoker  . Smokeless tobacco: Never Used  Vaping Use  . Vaping Use: Never used  Substance Use Topics  . Alcohol use: Yes    Comment: 2/month  . Drug use: No    Family History  Problem Relation Age of Onset  . Uterine cancer Mother 67       Cervical cath  . Crohn's disease Mother   . Esophageal cancer Father   . Cancer Other   . Obesity Other     Allergies  Allergen Reactions  . Morphine And Related Other (See Comments)    Hallucinations    Objective: Vitals:   06/19/20 1426  BP: 125/82  Pulse: 71  Temp: 99 F (37.2 C)  TempSrc: Oral  Weight: 283 lb (128.4 kg)   Body mass index is 40.61 kg/m.  Physical Exam Constitutional:      Comments: He is pleasant and in no obvious distress.  He is accompanied by his wife, [name]  Cardiovascular:     Rate and Rhythm: Normal rate. Rhythm irregular.     Heart sounds: No murmur heard.   Musculoskeletal:        General: Swelling and tenderness present.     Comments: He has diffuse swelling, redness and warmth of his right knee.        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  I will check CBC, BMP, sed rate, C-reactive protein and blood cultures today.  I suggested that he make an appointment to see his orthopedic surgeon, Dr. Adriana Mccallum, as soon as possible.       Relevant Orders   CBC   Basic metabolic panel   C-reactive protein   Sedimentation rate   Culture, blood (single) w Reflex to ID Panel   Culture, blood (single) w Reflex to ID Panel  Michel Bickers, MD [facility] [phone] pager   [phone] cell 06/19/2020, 2:58 PM

## 2020-06-19 NOTE — Assessment & Plan Note (Signed)
I am very worried about probable recurrent prosthetic knee infection.  I will check CBC, BMP, sed rate, C-reactive protein and blood cultures today.  I suggested that he make an appointment to see his orthopedic surgeon, Dr. Adriana Mccallum, as soon as possible.

## 2020-06-20 LAB — BASIC METABOLIC PANEL WITH GFR
BUN: 17 mg/dL (ref 7–25)
CO2: 25 mmol/L (ref 20–32)
Calcium: 9.4 mg/dL (ref 8.6–10.3)
Chloride: 100 mmol/L (ref 98–110)
Creat: 1.07 mg/dL (ref 0.70–1.25)
Glucose, Bld: 191 mg/dL — ABNORMAL HIGH (ref 65–99)
Potassium: 3.9 mmol/L (ref 3.5–5.3)
Sodium: 136 mmol/L (ref 135–146)

## 2020-06-20 LAB — SEDIMENTATION RATE: Sed Rate: 2 mm/h (ref 0–20)

## 2020-06-20 LAB — CBC
HCT: 56.1 % — ABNORMAL HIGH (ref 38.5–50.0)
Hemoglobin: 19.8 g/dL — ABNORMAL HIGH (ref 13.2–17.1)
MCH: 32.6 pg (ref 27.0–33.0)
MCHC: 35.3 g/dL (ref 32.0–36.0)
MCV: 92.4 fL (ref 80.0–100.0)
MPV: 10.3 fL (ref 7.5–12.5)
Platelets: 148 10*3/uL (ref 140–400)
RBC: 6.07 10*6/uL — ABNORMAL HIGH (ref 4.20–5.80)
RDW: 13.1 % (ref 11.0–15.0)
WBC: 7.6 10*3/uL (ref 3.8–10.8)

## 2020-06-20 LAB — C-REACTIVE PROTEIN: CRP: 138.3 mg/L — ABNORMAL HIGH (ref <8.0)

## 2020-06-20 MED FILL — ENTRESTO 24 MG-26 MG TABLET: 24-26 | 30 days supply | Qty: 60 | Fill #7

## 2020-06-24 ENCOUNTER — Other Ambulatory Visit: Payer: Self-pay

## 2020-06-24 ENCOUNTER — Encounter (HOSPITAL_COMMUNITY): Payer: Self-pay | Admitting: Orthopedic Surgery

## 2020-06-24 ENCOUNTER — Other Ambulatory Visit (HOSPITAL_COMMUNITY)
Admission: RE | Admit: 2020-06-24 | Discharge: 2020-06-24 | Disposition: A | Discharge status: Home / Self Care | Payer: 59 | Source: Ambulatory Visit | Attending: Orthopedic Surgery | Admitting: Orthopedic Surgery

## 2020-06-24 DIAGNOSIS — Z20822 Contact with and (suspected) exposure to covid-19: Secondary | ICD-10-CM | POA: Insufficient documentation

## 2020-06-24 DIAGNOSIS — Z01812 Encounter for preprocedural laboratory examination: Secondary | ICD-10-CM | POA: Diagnosis not present

## 2020-06-24 LAB — SARS CORONAVIRUS 2 (TAT 6-24 HRS): SARS Coronavirus 2: NEGATIVE

## 2020-06-24 NOTE — H&P (Signed)
[name]. is an 59 y.o. male.    Chief Complaint:  Infection about the right TKA   Procedure: Open excisional and non excisional debridement right knee, possible aspiration versus open arthrotomy poly exchange of the right TKA  HPI: Pt is a 59 y.o. male complaining of right knee pain and swelling. He is 10 months out from his last reimplantation/revision. His surgical history involving this right knee has been extremely complex and well documented within our system. States he has been doing well until Monday (06/17/2020). Taking Doxycycline bid chronically since his reimplantation 10 months ago.  He does note that he has had to crowns placed over the past 6 weeks. He did take antibiotics prior to this despite his chronic doxycycline knees.  Based on his exam findings I did aspirate the prepatellar space. The fluid I removed was clear and yellow without any signs of inflammation however it was sent off to be evaluated at [facility]. Dr. Alvan Dame did place him on Keflex.  Lab results indicated the presence of Staphylococcus.  Further discuss was had with the patient regarding proceeding with surgery on 06/25/2020.  Dr. Alvan Dame feels that an open excisional and nonexcisional debridement of the right knee with possible aspiration versus open arthrotomy and poly exchange would be prudent due to the patient history of issues with the right knee.  Patient is in agreement for surgery.   Risks, benefits and expectations were discussed with the patient. Patient understand the risks, benefits and expectations and wishes to proceed with surgery.    PCP: Denita Lung, MD  D/C Plans:       Home   Post-op Meds:       No Rx given   Tranexamic Acid:      To be given - IV   Decadron:      Is to be given    PMH: Past Medical History:  Diagnosis Date  . Arthritis   . Diabetes mellitus without complication ([place])    TYPE 2  . Diverticulosis   . Dyslipidemia   . GERD (gastroesophageal reflux  disease)   . Herpes labialis   . Hypertension   . Longstanding persistent atrial fibrillation ([place])   . Metabolic syndrome   . Obesity   . Proteinuria   . Psoriasis   . Seborrheic dermatitis   . Sleep apnea    very compliant with CPAP, PT NEEDS TO BRING OWN MACHINE  . TIA (transient ischemic attack) 2009    PSH: Past Surgical History:  Procedure Laterality Date  . CARDIOVERSION N/A 12/02/2016   Procedure: CARDIOVERSION;  Surgeon: Pixie Casino, MD;  Location: [facility] ENDOSCOPY;  Service: Cardiovascular;  Laterality: N/A;  . COLONOSCOPY    . EXCISIONAL TOTAL KNEE ARTHROPLASTY WITH ANTIBIOTIC SPACERS Right 03/28/2019   Procedure: EXCISIONAL TOTAL KNEE ARTHROPLASTY WITH ANTIBIOTIC SPACERS;  Surgeon: [name], MD;  Location: [facility] ORS;  Service: Orthopedics;  Laterality: Right;  90 mins  . I & D KNEE WITH POLY EXCHANGE Right 09/10/2018   Procedure: Right Knee Arthroplasty IRRIGATION AND DEBRIDEMENT KNEE WITH POLY EXCHANGE;  Surgeon: [name], MD;  Location: [facility] ORS;  Service: Orthopedics;  Laterality: Right;  . JOINT REPLACEMENT  11/03/10   LT HIP  . PFO occluder cardiac valve  2006   Dr. Einar Gip, (hole in heart)  . REIMPLANTATION OF TOTAL KNEE Right 06/15/2019   Procedure: Resection of antibiotic spacer and  irrigation and debridment and placement of new antibiotic spacer and components;  Surgeon: [name], MD;  Location: [facility] ORS;  Service: Orthopedics;  Laterality: Right;  90 mins  . REIMPLANTATION OF TOTAL KNEE Right 08/22/2019   Procedure: REIMPLANTATION OF TOTAL KNEE;  Surgeon: [name], MD;  Location: [facility] ORS;  Service: Orthopedics;  Laterality: Right;  120 mins  . SKIN BIOPSY Left 12/22/2019   epidermal inclusion cyst  . TONSILLECTOMY AND ADENOIDECTOMY    . TOTAL HIP ARTHROPLASTY Left   . TOTAL KNEE ARTHROPLASTY Right 05/31/2018   Procedure: RIGHT TOTAL KNEE ARTHROPLASTY;  Surgeon: [name], MD;  Location: [facility] ORS;  Service: Orthopedics;  Laterality: Right;  70 mins  .  TOTAL KNEE ARTHROPLASTY Left 06/28/2018   Procedure: LEFT TOTAL KNEE ARTHROPLASTY;  Surgeon: [name], MD;  Location: [facility] ORS;  Service: Orthopedics;  Laterality: Left;  70 mins  . WISDOM TOOTH EXTRACTION      Social History:  reports that he has never smoked. He has never used smokeless tobacco. He reports current alcohol use. He reports that he does not use drugs.  Allergies:  Allergies  Allergen Reactions  . Morphine And Related Other (See Comments)    Hallucinations    Medications: No current facility-administered medications for this encounter.   Current Outpatient Medications  Medication Sig Dispense Refill  . carvedilol (COREG) 25 MG tablet TAKE 1 TABLET BY MOUTH 2 TIMES DAILY. (Patient taking differently: Take 25 mg by mouth 2 (two) times daily with a meal. ) 180 tablet 2  . cephALEXin (KEFLEX) 500 MG capsule Take 500 mg by mouth 4 (four) times daily.    Marland Kitchen ENTRESTO 24-26 MG TAKE 1 TABLET BY MOUTH TWICE A DAY (Patient taking differently: Take 1 tablet by mouth 2 (two) times daily. ) 60 tablet 8  . Ixekizumab (TALTZ) 80 MG/ML SOAJ Inject 80 mg into the skin every 28 (twenty-eight) days. Inject below the skin as directed; inject every 4 weeks. 1 mL 11  . JARDIANCE 10 MG TABS tablet TAKE 1 TABLET BY MOUTH ONCE DAILY (Patient taking differently: Take 10 mg by mouth daily. ) 90 tablet 0  . Multiple Vitamin (MULTIVITAMIN WITH MINERALS) TABS tablet Take 1 tablet by mouth daily.    Marland Kitchen oxymetazoline (AFRIN) 0.05 % nasal spray Place 1 spray into both nostrils at bedtime.     . pantoprazole (PROTONIX) 40 MG tablet Take 1 tablet (40 mg total) by mouth 2 (two) times daily. (Patient taking differently: Take 40 mg by mouth every other day. ) 270 tablet 3  . rosuvastatin (CRESTOR) 10 MG tablet TAKE 1 TABLET (10 MG TOTAL) BY MOUTH DAILY. (Patient taking differently: Take 10 mg by mouth at bedtime. ) 90 tablet 2  . Vitamin D, Cholecalciferol, 1000 units CAPS Take 1,000 Units by mouth daily.       Alveda Reasons 20 MG TABS tablet TAKE 1 TABLET BY MOUTH DAILY WITH SUPPER. (Patient taking differently: Take 20 mg by mouth daily with supper. ) 90 tablet 1  . glucose blood test strip 1 each by Other route as needed. Use as instructed Freestyle test stripts 100 each 3  . glucose monitoring kit (FREESTYLE) monitoring kit 1 each by Does not apply route as needed. 1 each 0  . Lancets (FREESTYLE) lancets 1 each by Other route as needed. Use as instructed 100 each 3       Review of Systems  Constitutional: Negative.   HENT: Negative.   Eyes: Negative.   Respiratory: Negative.   Cardiovascular: Negative.   Gastrointestinal: Negative.   Genitourinary:  Negative.   Musculoskeletal: Positive for joint pain.  Skin: Negative.   Neurological: Negative.   Endo/Heme/Allergies: Negative.   Psychiatric/Behavioral: Negative.        Physical Exam Constitutional:      Appearance: He is well-developed.  HENT:     Head: Normocephalic.  Eyes:     Pupils: Pupils are equal, round, and reactive to light.  Neck:     Thyroid: No thyromegaly.     Vascular: No JVD.     Trachea: No tracheal deviation.  Cardiovascular:     Rate and Rhythm: Normal rate and regular rhythm.  Pulmonary:     Effort: Pulmonary effort is normal. No respiratory distress.     Breath sounds: Normal breath sounds. No wheezing.  Abdominal:     Palpations: Abdomen is soft.     Tenderness: There is no abdominal tenderness. There is no guarding.  Musculoskeletal:     Cervical back: Neck supple.     Left knee: Swelling and effusion present. No erythema or ecchymosis. Decreased range of motion. Tenderness present.  Lymphadenopathy:     Cervical: No cervical adenopathy.  Skin:    General: Skin is warm and dry.  Neurological:     Mental Status: He is alert and oriented to person, place, and time.        Assessment/Plan Assessment:  Infection about the right TKA   Plan: Patient will undergo an open excisional and non  excisional debridement right knee, possible aspiration versus open arthrotomy poly exchange of the right TKA on 06/25/2020 per Dr. Alvan Dame at [facility]. Risks benefits and expectations were discussed with the patient. Patient understand risks, benefits and expectations and wishes to proceed.   [place] Douglas Smolinsky   PA-C  06/24/2020, 3:52 PM

## 2020-06-24 NOTE — Progress Notes (Signed)
COVID Vaccine Completed: Yes Date COVID Vaccine completed: COVID vaccine manufacturer: [place]   PCP - Jill Alexanders, MD Cardiologist - Grayland Jack, MD LOV 04-01-20 w/FU in 12 months.  Chest x-ray -  EKG - 08-17-19 Stress Test -  ECHO -  Cardiac Cath -  Pacemaker/ICD device last checked:  Sleep Study -  CPAP - Mask and tubing  Fasting Blood Sugar - 145 Checks Blood Sugar Daily times a day  HGA1C 05-21-20 6.0  Blood Thinner Instructions: Last taken 06/24/20 Aspirin Instructions: Last Dose:  Anesthesia review:   Patient denies shortness of breath, fever, cough and chest pain at PAT appointment   Patient verbalized understanding of instructions that were given to them at the PAT appointment. Patient was also instructed that they will need to review over the PAT instructions again at home before surgery.

## 2020-06-25 ENCOUNTER — Encounter (HOSPITAL_COMMUNITY)
Admission: RE | Disposition: A | Discharge status: Home / Self Care | Payer: Self-pay | Source: Home / Self Care | Attending: Orthopedic Surgery

## 2020-06-25 ENCOUNTER — Ambulatory Visit (HOSPITAL_COMMUNITY): Payer: 59 | Admitting: Certified Registered Nurse Anesthetist

## 2020-06-25 ENCOUNTER — Observation Stay: Payer: Self-pay

## 2020-06-25 ENCOUNTER — Encounter (HOSPITAL_COMMUNITY): Payer: Self-pay | Admitting: Orthopedic Surgery

## 2020-06-25 ENCOUNTER — Observation Stay (HOSPITAL_COMMUNITY)
Admission: RE | Admit: 2020-06-25 | Discharge: 2020-06-26 | Disposition: A | Discharge status: Home / Self Care | Payer: 59 | Attending: Orthopedic Surgery | Admitting: Orthopedic Surgery

## 2020-06-25 DIAGNOSIS — Z96642 Presence of left artificial hip joint: Secondary | ICD-10-CM | POA: Insufficient documentation

## 2020-06-25 DIAGNOSIS — Z96653 Presence of artificial knee joint, bilateral: Secondary | ICD-10-CM | POA: Insufficient documentation

## 2020-06-25 DIAGNOSIS — Z8739 Personal history of other diseases of the musculoskeletal system and connective tissue: Secondary | ICD-10-CM | POA: Diagnosis not present

## 2020-06-25 DIAGNOSIS — Z96651 Presence of right artificial knee joint: Secondary | ICD-10-CM | POA: Diagnosis not present

## 2020-06-25 DIAGNOSIS — M7110 Other infective bursitis, unspecified site: Secondary | ICD-10-CM

## 2020-06-25 DIAGNOSIS — Z79899 Other long term (current) drug therapy: Secondary | ICD-10-CM | POA: Insufficient documentation

## 2020-06-25 DIAGNOSIS — Z95828 Presence of other vascular implants and grafts: Secondary | ICD-10-CM

## 2020-06-25 DIAGNOSIS — E119 Type 2 diabetes mellitus without complications: Secondary | ICD-10-CM | POA: Insufficient documentation

## 2020-06-25 DIAGNOSIS — Z8673 Personal history of transient ischemic attack (TIA), and cerebral infarction without residual deficits: Secondary | ICD-10-CM | POA: Diagnosis not present

## 2020-06-25 DIAGNOSIS — I5022 Chronic systolic (congestive) heart failure: Secondary | ICD-10-CM | POA: Diagnosis not present

## 2020-06-25 DIAGNOSIS — B958 Unspecified staphylococcus as the cause of diseases classified elsewhere: Secondary | ICD-10-CM

## 2020-06-25 DIAGNOSIS — I10 Essential (primary) hypertension: Secondary | ICD-10-CM | POA: Diagnosis not present

## 2020-06-25 DIAGNOSIS — Z7901 Long term (current) use of anticoagulants: Secondary | ICD-10-CM | POA: Insufficient documentation

## 2020-06-25 DIAGNOSIS — Z7984 Long term (current) use of oral hypoglycemic drugs: Secondary | ICD-10-CM | POA: Insufficient documentation

## 2020-06-25 DIAGNOSIS — T8453XA Infection and inflammatory reaction due to internal right knee prosthesis, initial encounter: Principal | ICD-10-CM

## 2020-06-25 DIAGNOSIS — T8453XD Infection and inflammatory reaction due to internal right knee prosthesis, subsequent encounter: Secondary | ICD-10-CM

## 2020-06-25 DIAGNOSIS — I110 Hypertensive heart disease with heart failure: Secondary | ICD-10-CM | POA: Diagnosis not present

## 2020-06-25 DIAGNOSIS — M25561 Pain in right knee: Secondary | ICD-10-CM | POA: Diagnosis present

## 2020-06-25 DIAGNOSIS — G8918 Other acute postprocedural pain: Secondary | ICD-10-CM | POA: Diagnosis not present

## 2020-06-25 HISTORY — PX: I & D KNEE WITH POLY EXCHANGE: SHX5024

## 2020-06-25 LAB — GLUCOSE, CAPILLARY
Glucose-Capillary: 173 mg/dL — ABNORMAL HIGH (ref 70–99)
Glucose-Capillary: 154 mg/dL — ABNORMAL HIGH (ref 70–99)
Glucose-Capillary: 161 mg/dL — ABNORMAL HIGH (ref 70–99)
Glucose-Capillary: 234 mg/dL — ABNORMAL HIGH (ref 70–99)

## 2020-06-25 SURGERY — IRRIGATION AND DEBRIDEMENT KNEE WITH POLY EXCHANGE
Anesthesia: Spinal | Site: Knee | Laterality: Right

## 2020-06-25 MED ORDER — CARVEDILOL 25 MG PO TABS
25.0000 mg | ORAL_TABLET | Freq: Two times a day (BID) | ORAL | Status: DC
  Filled 2020-06-25: qty 1

## 2020-06-25 MED ORDER — PROPOFOL 10 MG/ML IV BOLUS
INTRAVENOUS | Status: DC | PRN
  Administered 2020-06-25: 20 mg via INTRAVENOUS
  Administered 2020-06-25: 200 mg via INTRAVENOUS
  Administered 2020-06-25: 20 mg via INTRAVENOUS
  Administered 2020-06-25: 50 mg via INTRAVENOUS

## 2020-06-25 MED ORDER — MAGNESIUM CITRATE PO SOLN: 1.0000 | Freq: Once | ORAL | Status: DC | PRN

## 2020-06-25 MED ORDER — PHENYLEPHRINE 40 MCG/ML (10ML) SYRINGE FOR IV PUSH (FOR BLOOD PRESSURE SUPPORT)
PREFILLED_SYRINGE | INTRAVENOUS | Status: AC
  Filled 2020-06-25: qty 20

## 2020-06-25 MED ORDER — PHENOL 1.4 % MT LIQD: 1.0000 | OROMUCOSAL | Status: DC | PRN

## 2020-06-25 MED ORDER — DEXAMETHASONE SODIUM PHOSPHATE 10 MG/ML IJ SOLN
INTRAMUSCULAR | Status: AC
  Filled 2020-06-25: qty 1

## 2020-06-25 MED ORDER — TRANEXAMIC ACID-NACL 1000-0.7 MG/100ML-% IV SOLN
INTRAVENOUS | Status: DC | PRN
  Administered 2020-06-25: 1000 mg via INTRAVENOUS

## 2020-06-25 MED ORDER — MENTHOL 3 MG MT LOZG: 1.0000 | LOZENGE | OROMUCOSAL | Status: DC | PRN

## 2020-06-25 MED ORDER — FENTANYL CITRATE (PF) 100 MCG/2ML IJ SOLN
25.0000 ug | INTRAMUSCULAR | Status: DC | PRN
  Administered 2020-06-25: 25 ug via INTRAVENOUS
  Filled 2020-06-25: qty 2

## 2020-06-25 MED ORDER — ONDANSETRON HCL 4 MG/2ML IJ SOLN: 4.0000 mg | Freq: Once | INTRAMUSCULAR | Status: DC | PRN

## 2020-06-25 MED ORDER — METOCLOPRAMIDE HCL 5 MG/ML IJ SOLN: 5.0000 mg | Freq: Three times a day (TID) | INTRAMUSCULAR | Status: DC | PRN

## 2020-06-25 MED ORDER — MIDAZOLAM HCL 2 MG/2ML IJ SOLN: 1.0000 mg | Freq: Once | INTRAMUSCULAR | Status: AC

## 2020-06-25 MED ORDER — EPHEDRINE SULFATE-NACL 50-0.9 MG/10ML-% IV SOSY
PREFILLED_SYRINGE | INTRAVENOUS | Status: DC | PRN
  Administered 2020-06-25 (×2): 10 mg via INTRAVENOUS

## 2020-06-25 MED ORDER — FENTANYL CITRATE (PF) 100 MCG/2ML IJ SOLN
INTRAMUSCULAR | Status: DC | PRN
Start: 2020-06-25 — End: 2020-06-25
  Administered 2020-06-25 (×4): 25 ug via INTRAVENOUS

## 2020-06-25 MED ORDER — SACUBITRIL-VALSARTAN 24-26 MG PO TABS
1.0000 | ORAL_TABLET | Freq: Two times a day (BID) | ORAL | Status: DC
  Administered 2020-06-25 – 2020-06-26 (×2): 1 via ORAL
  Filled 2020-06-25 (×2): qty 1

## 2020-06-25 MED ORDER — HYDROMORPHONE HCL 1 MG/ML IJ SOLN
INTRAMUSCULAR | Status: AC
  Administered 2020-06-25: 0.5 mg via INTRAVENOUS
  Filled 2020-06-25: qty 1

## 2020-06-25 MED ORDER — DEXAMETHASONE SODIUM PHOSPHATE 10 MG/ML IJ SOLN
INTRAMUSCULAR | Status: DC | PRN
  Administered 2020-06-25: 10 mg via INTRAVENOUS

## 2020-06-25 MED ORDER — ROSUVASTATIN CALCIUM 10 MG PO TABS
10.0000 mg | ORAL_TABLET | Freq: Every day | ORAL | Status: DC
  Administered 2020-06-25: 10 mg via ORAL
  Filled 2020-06-25: qty 1

## 2020-06-25 MED ORDER — ONDANSETRON HCL 4 MG/2ML IJ SOLN
INTRAMUSCULAR | Status: AC
  Filled 2020-06-25: qty 2

## 2020-06-25 MED ORDER — 0.9 % SODIUM CHLORIDE (POUR BTL) OPTIME
TOPICAL | Status: DC | PRN
  Administered 2020-06-25: 1000 mL

## 2020-06-25 MED ORDER — METHOCARBAMOL 500 MG PO TABS
500.0000 mg | ORAL_TABLET | Freq: Four times a day (QID) | ORAL | Status: DC | PRN
  Administered 2020-06-25: 500 mg via ORAL
  Filled 2020-06-25: qty 1

## 2020-06-25 MED ORDER — BISACODYL 10 MG RE SUPP: 10.0000 mg | Freq: Every day | RECTAL | Status: DC | PRN

## 2020-06-25 MED ORDER — METOCLOPRAMIDE HCL 5 MG PO TABS
5.0000 mg | ORAL_TABLET | Freq: Three times a day (TID) | ORAL | Status: DC | PRN
  Filled 2020-06-25: qty 2

## 2020-06-25 MED ORDER — ONDANSETRON HCL 4 MG/2ML IJ SOLN: 4.0000 mg | Freq: Four times a day (QID) | INTRAMUSCULAR | Status: DC | PRN

## 2020-06-25 MED ORDER — TRANEXAMIC ACID-NACL 1000-0.7 MG/100ML-% IV SOLN
1000.0000 mg | INTRAVENOUS | Status: DC
  Filled 2020-06-25: qty 100

## 2020-06-25 MED ORDER — ORAL CARE MOUTH RINSE: 15.0000 mL | Freq: Once | OROMUCOSAL | Status: AC

## 2020-06-25 MED ORDER — PROPOFOL 10 MG/ML IV BOLUS
INTRAVENOUS | Status: AC
  Filled 2020-06-25: qty 40

## 2020-06-25 MED ORDER — PANTOPRAZOLE SODIUM 40 MG PO TBEC: 40.0000 mg | DELAYED_RELEASE_TABLET | ORAL | Status: DC

## 2020-06-25 MED ORDER — SODIUM CHLORIDE 0.9 % IV SOLN: INTRAVENOUS | Status: DC

## 2020-06-25 MED ORDER — POLYETHYLENE GLYCOL 3350 17 G PO PACK
17.0000 g | PACK | Freq: Two times a day (BID) | ORAL | Status: DC
  Filled 2020-06-25 (×2): qty 1

## 2020-06-25 MED ORDER — OXYMETAZOLINE HCL 0.05 % NA SOLN
1.0000 | Freq: Every day | NASAL | Status: DC
  Administered 2020-06-25: 1 via NASAL
  Filled 2020-06-25: qty 15

## 2020-06-25 MED ORDER — IRRISEPT - 450ML BOTTLE WITH 0.05% CHG IN STERILE WATER, USP 99.95% OPTIME
TOPICAL | Status: DC | PRN
Start: 2020-06-25 — End: 2020-06-25
  Administered 2020-06-25: 450 mL

## 2020-06-25 MED ORDER — FENTANYL CITRATE (PF) 100 MCG/2ML IJ SOLN: 50.0000 ug | Freq: Once | INTRAMUSCULAR | Status: AC

## 2020-06-25 MED ORDER — SODIUM CHLORIDE 0.9 % IR SOLN
Status: DC | PRN
  Administered 2020-06-25: 3000 mL

## 2020-06-25 MED ORDER — TRANEXAMIC ACID-NACL 1000-0.7 MG/100ML-% IV SOLN
1000.0000 mg | Freq: Once | INTRAVENOUS | Status: AC
  Administered 2020-06-25: 1000 mg via INTRAVENOUS
  Filled 2020-06-25: qty 100

## 2020-06-25 MED ORDER — VANCOMYCIN HCL 1500 MG/300ML IV SOLN
1500.0000 mg | Freq: Once | INTRAVENOUS | Status: AC
  Administered 2020-06-25: 1500 mg via INTRAVENOUS
  Filled 2020-06-25: qty 300

## 2020-06-25 MED ORDER — DEXAMETHASONE SODIUM PHOSPHATE 10 MG/ML IJ SOLN: 10.0000 mg | Freq: Once | INTRAMUSCULAR | Status: DC

## 2020-06-25 MED ORDER — HYDROMORPHONE HCL 1 MG/ML IJ SOLN
0.2500 mg | INTRAMUSCULAR | Status: DC | PRN
  Administered 2020-06-25 (×2): 0.5 mg via INTRAVENOUS

## 2020-06-25 MED ORDER — DIPHENHYDRAMINE HCL 12.5 MG/5ML PO ELIX: 12.5000 mg | ORAL_SOLUTION | ORAL | Status: DC | PRN

## 2020-06-25 MED ORDER — LIDOCAINE 2% (20 MG/ML) 5 ML SYRINGE
INTRAMUSCULAR | Status: AC
  Filled 2020-06-25: qty 5

## 2020-06-25 MED ORDER — METHOCARBAMOL 500 MG IVPB - SIMPLE MED
INTRAVENOUS | Status: AC
  Administered 2020-06-25: 500 mg via INTRAVENOUS
  Filled 2020-06-25: qty 50

## 2020-06-25 MED ORDER — INSULIN ASPART 100 UNIT/ML ~~LOC~~ SOLN
0.0000 [IU] | Freq: Three times a day (TID) | SUBCUTANEOUS | Status: DC
  Administered 2020-06-26 (×2): 5 [IU] via SUBCUTANEOUS

## 2020-06-25 MED ORDER — RIVAROXABAN 10 MG PO TABS: 10.0000 mg | ORAL_TABLET | ORAL | Status: DC

## 2020-06-25 MED ORDER — VANCOMYCIN HCL 1000 MG IV SOLR
INTRAVENOUS | Status: AC
  Filled 2020-06-25: qty 1000

## 2020-06-25 MED ORDER — MIDAZOLAM HCL 2 MG/2ML IJ SOLN
INTRAMUSCULAR | Status: AC
  Administered 2020-06-25: 2 mg via INTRAVENOUS
  Filled 2020-06-25: qty 2

## 2020-06-25 MED ORDER — POVIDONE-IODINE 10 % EX SWAB: 2.0000 "application " | Freq: Once | CUTANEOUS | Status: DC

## 2020-06-25 MED ORDER — PHENYLEPHRINE 40 MCG/ML (10ML) SYRINGE FOR IV PUSH (FOR BLOOD PRESSURE SUPPORT)
PREFILLED_SYRINGE | INTRAVENOUS | Status: DC | PRN
  Administered 2020-06-25 (×5): 80 ug via INTRAVENOUS

## 2020-06-25 MED ORDER — PHENYLEPHRINE 40 MCG/ML (10ML) SYRINGE FOR IV PUSH (FOR BLOOD PRESSURE SUPPORT)
PREFILLED_SYRINGE | INTRAVENOUS | Status: AC
  Filled 2020-06-25: qty 10

## 2020-06-25 MED ORDER — CHLORHEXIDINE GLUCONATE 0.12 % MT SOLN
15.0000 mL | Freq: Once | OROMUCOSAL | Status: AC
  Administered 2020-06-25: 15 mL via OROMUCOSAL

## 2020-06-25 MED ORDER — INSULIN ASPART 100 UNIT/ML ~~LOC~~ SOLN
0.0000 [IU] | Freq: Every day | SUBCUTANEOUS | Status: DC
  Administered 2020-06-25: 2 [IU] via SUBCUTANEOUS

## 2020-06-25 MED ORDER — VANCOMYCIN HCL IN DEXTROSE 1-5 GM/200ML-% IV SOLN
1000.0000 mg | Freq: Once | INTRAVENOUS | Status: AC
  Administered 2020-06-26: 1000 mg via INTRAVENOUS
  Filled 2020-06-25 (×2): qty 200

## 2020-06-25 MED ORDER — LIDOCAINE HCL (CARDIAC) PF 100 MG/5ML IV SOSY
PREFILLED_SYRINGE | INTRAVENOUS | Status: DC | PRN
  Administered 2020-06-25: 40 mg via INTRAVENOUS

## 2020-06-25 MED ORDER — OXYCODONE HCL 5 MG PO TABS
10.0000 mg | ORAL_TABLET | ORAL | Status: DC | PRN
  Administered 2020-06-25 (×2): 10 mg via ORAL
  Filled 2020-06-25 (×2): qty 2

## 2020-06-25 MED ORDER — METHOCARBAMOL 500 MG IVPB - SIMPLE MED
500.0000 mg | Freq: Four times a day (QID) | INTRAVENOUS | Status: DC | PRN
  Filled 2020-06-25: qty 50

## 2020-06-25 MED ORDER — LACTATED RINGERS IV SOLN: INTRAVENOUS | Status: DC

## 2020-06-25 MED ORDER — ONDANSETRON HCL 4 MG PO TABS
4.0000 mg | ORAL_TABLET | Freq: Four times a day (QID) | ORAL | Status: DC | PRN
  Filled 2020-06-25: qty 1

## 2020-06-25 MED ORDER — EPHEDRINE 5 MG/ML INJ
INTRAVENOUS | Status: AC
  Filled 2020-06-25: qty 30

## 2020-06-25 MED ORDER — ALUM & MAG HYDROXIDE-SIMETH 200-200-20 MG/5ML PO SUSP: 15.0000 mL | ORAL | Status: DC | PRN

## 2020-06-25 MED ORDER — ONDANSETRON HCL 4 MG/2ML IJ SOLN
INTRAMUSCULAR | Status: DC | PRN
  Administered 2020-06-25: 4 mg via INTRAVENOUS

## 2020-06-25 MED ORDER — BUPIVACAINE HCL (PF) 0.5 % IJ SOLN
INTRAMUSCULAR | Status: DC | PRN
  Administered 2020-06-25: 30 mL via PERINEURAL

## 2020-06-25 MED ORDER — FERROUS SULFATE 325 (65 FE) MG PO TABS: 325.0000 mg | ORAL_TABLET | Freq: Two times a day (BID) | ORAL | Status: DC

## 2020-06-25 MED ORDER — SUCCINYLCHOLINE CHLORIDE 200 MG/10ML IV SOSY
PREFILLED_SYRINGE | INTRAVENOUS | Status: AC
  Filled 2020-06-25: qty 10

## 2020-06-25 MED ORDER — VANCOMYCIN HCL 1000 MG IV SOLR
INTRAVENOUS | Status: DC | PRN
  Administered 2020-06-25: 1000 mg via TOPICAL

## 2020-06-25 MED ORDER — CELECOXIB 200 MG PO CAPS
200.0000 mg | ORAL_CAPSULE | Freq: Two times a day (BID) | ORAL | Status: DC
  Administered 2020-06-25 – 2020-06-26 (×2): 200 mg via ORAL
  Filled 2020-06-25 (×2): qty 1

## 2020-06-25 MED ORDER — OXYCODONE HCL 5 MG PO TABS: 5.0000 mg | ORAL_TABLET | ORAL | Status: DC | PRN

## 2020-06-25 MED ORDER — FENTANYL CITRATE (PF) 100 MCG/2ML IJ SOLN
INTRAMUSCULAR | Status: AC
  Administered 2020-06-25: 100 ug via INTRAVENOUS
  Filled 2020-06-25: qty 2

## 2020-06-25 MED ORDER — ACETAMINOPHEN 500 MG PO TABS
1000.0000 mg | ORAL_TABLET | Freq: Four times a day (QID) | ORAL | Status: AC
  Administered 2020-06-25 – 2020-06-26 (×4): 1000 mg via ORAL
  Filled 2020-06-25 (×4): qty 2

## 2020-06-25 MED ORDER — FENTANYL CITRATE (PF) 100 MCG/2ML IJ SOLN
INTRAMUSCULAR | Status: AC
  Filled 2020-06-25: qty 2

## 2020-06-25 MED ORDER — EMPAGLIFLOZIN 10 MG PO TABS
10.0000 mg | ORAL_TABLET | Freq: Every day | ORAL | Status: DC
  Administered 2020-06-26: 10 mg via ORAL
  Filled 2020-06-25: qty 1

## 2020-06-25 MED ORDER — CLONIDINE HCL (ANALGESIA) 100 MCG/ML EP SOLN
EPIDURAL | Status: DC | PRN
  Administered 2020-06-25: 100 ug

## 2020-06-25 MED ORDER — DOCUSATE SODIUM 100 MG PO CAPS
100.0000 mg | ORAL_CAPSULE | Freq: Two times a day (BID) | ORAL | Status: DC
  Administered 2020-06-25: 100 mg via ORAL
  Filled 2020-06-25 (×2): qty 1

## 2020-06-25 MED ORDER — DEXTROSE 5 % IV SOLN
3.0000 g | Freq: Once | INTRAVENOUS | Status: AC
  Administered 2020-06-25: 3 g via INTRAVENOUS
  Filled 2020-06-25: qty 3

## 2020-06-25 MED ORDER — DEXAMETHASONE SODIUM PHOSPHATE 10 MG/ML IJ SOLN
10.0000 mg | Freq: Once | INTRAMUSCULAR | Status: AC
  Administered 2020-06-26: 10 mg via INTRAVENOUS
  Filled 2020-06-25: qty 1

## 2020-06-25 SURGICAL SUPPLY — 46 items
BAG ZIPLOCK 12X15 (MISCELLANEOUS) ×2 IMPLANT
BNDG ELASTIC 4X5.8 VLCR STR LF (GAUZE/BANDAGES/DRESSINGS) IMPLANT
BNDG ELASTIC 6X5.8 VLCR STR LF (GAUZE/BANDAGES/DRESSINGS) ×2 IMPLANT
COVER SURGICAL LIGHT HANDLE (MISCELLANEOUS) ×2 IMPLANT
COVER WAND RF STERILE (DRAPES) IMPLANT
CUFF TOURN SGL QUICK 34 (TOURNIQUET CUFF) ×2
CUFF TRNQT CYL 34X4.125X (TOURNIQUET CUFF) ×1 IMPLANT
DECANTER SPIKE VIAL GLASS SM (MISCELLANEOUS) ×2 IMPLANT
DERMABOND ADVANCED (GAUZE/BANDAGES/DRESSINGS) ×1
DERMABOND ADVANCED .7 DNX12 (GAUZE/BANDAGES/DRESSINGS) ×1 IMPLANT
DRAPE U-SHAPE 47X51 STRL (DRAPES) ×2 IMPLANT
DRSG AQUACEL AG ADV 3.5X10 (GAUZE/BANDAGES/DRESSINGS) ×2 IMPLANT
DRSG AQUACEL AG ADV 3.5X14 (GAUZE/BANDAGES/DRESSINGS) IMPLANT
DRSG PAD ABDOMINAL 8X10 ST (GAUZE/BANDAGES/DRESSINGS) ×4 IMPLANT
DURAPREP 26ML APPLICATOR (WOUND CARE) ×4 IMPLANT
ELECT REM PT RETURN 15FT ADLT (MISCELLANEOUS) ×2 IMPLANT
GLOVE BIOGEL PI IND STRL 7.5 (GLOVE) ×1 IMPLANT
GLOVE BIOGEL PI IND STRL 8.5 (GLOVE) ×1 IMPLANT
GLOVE BIOGEL PI INDICATOR 7.5 (GLOVE) ×1
GLOVE BIOGEL PI INDICATOR 8.5 (GLOVE) ×1
GLOVE ECLIPSE 8.0 STRL XLNG CF (GLOVE) ×2 IMPLANT
GLOVE ORTHO TXT STRL SZ7.5 (GLOVE) ×2 IMPLANT
GOWN STRL REUS W/TWL LRG LVL3 (GOWN DISPOSABLE) ×2 IMPLANT
GOWN STRL REUS W/TWL XL LVL3 (GOWN DISPOSABLE) ×2 IMPLANT
HANDPIECE INTERPULSE COAX TIP (DISPOSABLE) ×2
JET LAVAGE IRRISEPT WOUND (IRRIGATION / IRRIGATOR) ×2
KIT TURNOVER KIT A (KITS) IMPLANT
LAVAGE JET IRRISEPT WOUND (IRRIGATION / IRRIGATOR) ×1 IMPLANT
MANIFOLD NEPTUNE II (INSTRUMENTS) ×2 IMPLANT
NS IRRIG 1000ML POUR BTL (IV SOLUTION) ×2 IMPLANT
PACK TOTAL KNEE CUSTOM (KITS) ×2 IMPLANT
PENCIL SMOKE EVACUATOR (MISCELLANEOUS) IMPLANT
PROTECTOR NERVE ULNAR (MISCELLANEOUS) ×2 IMPLANT
SET HNDPC FAN SPRY TIP SCT (DISPOSABLE) ×1 IMPLANT
SET PAD KNEE POSITIONER (MISCELLANEOUS) ×2 IMPLANT
STAPLER VISISTAT 35W (STAPLE) IMPLANT
SUT MNCRL AB 3-0 PS2 18 (SUTURE) IMPLANT
SUT STRATAFIX 0 PDS 27 VIOLET (SUTURE) ×2
SUT VIC AB 1 CT1 36 (SUTURE) ×2 IMPLANT
SUT VIC AB 2-0 CT1 27 (SUTURE) ×6
SUT VIC AB 2-0 CT1 TAPERPNT 27 (SUTURE) ×3 IMPLANT
SUTURE STRATFX 0 PDS 27 VIOLET (SUTURE) ×1 IMPLANT
SWAB COLLECTION DEVICE MRSA (MISCELLANEOUS) IMPLANT
SWAB CULTURE ESWAB REG 1ML (MISCELLANEOUS) IMPLANT
TRAY FOLEY MTR SLVR 16FR STAT (SET/KITS/TRAYS/PACK) ×2 IMPLANT
WRAP KNEE MAXI GEL POST OP (GAUZE/BANDAGES/DRESSINGS) ×2 IMPLANT

## 2020-06-25 NOTE — Consult Note (Addendum)
[facility] for Infectious Disease  Total days of antibiotics 1       Reason for Consult: septic bursitis of right knee  Referring Physician: olin  Active Problems:   Infection of prosthetic right knee joint ([place])    HPI: Shogo Larkey. is a 59 y.o. 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 He had reimplantation roughly 10 months ago that was complicated by pre-patellar seromas. Recently, started to have constitutional symptoms with swelling of prepatellar regions. He was seen as outpatient by dr Megan Salon and dr Alvan Dame last week. He underwent aspiration of pre-patellar space with cultures growing Oxa R staph epi. He previously had been on cephalexin chronic suppression. He went to OR today for wash out of prepatellar space and stripping of granulation tissue of bursa. He received vancomycin and cefazolin as preop abtx. Joint was not aspirated due to concern for introduction of bacteria.     Past Medical History:  Diagnosis Date  . Arthritis   . Diabetes mellitus without complication ([place])    TYPE 2  . Diverticulosis   . Dyslipidemia   . GERD (gastroesophageal reflux disease)   . Herpes labialis   . Hypertension   . Longstanding persistent atrial fibrillation ([facility])   . Metabolic syndrome   . Obesity   . Proteinuria   . Psoriasis   . Seborrheic dermatitis   . Sleep apnea    very compliant with CPAP, PT NEEDS TO BRING OWN MACHINE  . TIA (transient ischemic attack) 2009    Allergies:  Allergies  Allergen Reactions  . Morphine And Related Other (See Comments)    Hallucinations     MEDICATIONS: . acetaminophen  1,000 mg Oral Q6H  . celecoxib  200 mg Oral BID    Social History   Tobacco Use  . Smoking status: Never Smoker  . Smokeless tobacco: Never Used  Vaping Use  . Vaping Use: Never used  Substance Use Topics  . Alcohol use: Yes    Comment: 2/month  . Drug use: No    Family History  Problem Relation  Age of Onset  . Uterine cancer Mother 25       Cervical cath  . Crohn's disease Mother   . Esophageal cancer Father   . Cancer Other   . Obesity Other      Review of Systems  Constitutional: positive for fever, chills, diaphoresis, activity change, appetite change, fatigue and unexpected weight change.  HENT: Negative for congestion, sore throat, rhinorrhea, sneezing, trouble swallowing and sinus pressure.  Eyes: Negative for photophobia and visual disturbance.  Respiratory: Negative for cough, chest tightness, shortness of breath, wheezing and stridor.  Cardiovascular: Negative for chest pain, palpitations and leg swelling.  Gastrointestinal: Negative for nausea, vomiting, abdominal pain, diarrhea, constipation, blood in stool, abdominal distention and anal bleeding.  Genitourinary: Negative for dysuria, hematuria, flank pain and difficulty urinating.  Musculoskeletal: Negative for myalgias, back pain, joint swelling, arthralgias and gait problem.  Skin: Negative for color change, pallor, rash and wound.  Neurological: Negative for dizziness, tremors, weakness and light-headedness.  Hematological: Negative for adenopathy. Does not bruise/bleed easily.  Psychiatric/Behavioral: Negative for behavioral problems, confusion, sleep disturbance, dysphoric mood, decreased concentration and agitation.     OBJECTIVE: Temp:  [97.7 F (36.5 C)-99 F (37.2 C)] 97.7 F (36.5 C) (10/26 1700) Pulse Rate:  [45-88] 64 (10/26 1715) Resp:  [12-19] 18 (10/26 1715) BP: (111-143)/(79-112) 133/98 (10/26 1715) SpO2:  [95 %-  100 %] 98 % (10/26 1715) Weight:  [127.2 kg] 127.2 kg (10/26 1155) Physical Exam  Constitutional: He is oriented to person, place, and time. He appears well-developed and well-nourished. No distress.  HENT:  Mouth/Throat: Oropharynx is clear and moist. No oropharyngeal exudate.  Cardiovascular: Normal rate, regular rhythm and normal heart sounds. Exam reveals no gallop and no  friction rub.  No murmur heard.  Pulmonary/Chest: Effort normal and breath sounds normal. No respiratory distress. He has no wheezes.  Abdominal: Soft. Bowel sounds are normal. He exhibits no distension. There is no tenderness.  Ext: wrapped right knee Lymphadenopathy:  He has no cervical adenopathy.  Neurological: He is alert and oriented to person, place, and time.  Skin: Skin is warm and dry. No rash noted. No erythema.  Psychiatric: He has a normal mood and affect. His behavior is normal.     LABS: Results for orders placed or performed during the hospital encounter of 06/25/20 (from the past 48 hour(s))  Glucose, capillary     Status: Abnormal   Collection Time: 06/25/20 11:43 AM  Result Value Ref Range   Glucose-Capillary 173 (H) 70 - 99 mg/dL    Comment: Glucose reference range applies only to samples taken after fasting for at least 8 hours.   Comment 1 Notify RN    Comment 2 Document in Chart    Lab Results  Component Value Date   ESRSEDRATE 2 06/19/2020    MICRO: reviewed IMAGING: [place] EKG SITE RITE  Result Date: 06/25/2020 If Site Rite image not attached, placement could not be confirmed due to current cardiac rhythm.   Assessment/Plan: 59yo M with hx of right PJI s/p 2 stage revision with latest implant roughly 10 months ago found to have staph epi (oxa R) pre-patellar septic arthritis POD# 1 washout  - agree iwht plan to get picc line and start iv vancomycin. If he is approved for daptomycin at 8 mg/kg/d-would be preferred regimen - plan to treat for 4 wks  ------------\updated dr Alvan Dame with the plan and also to see dr Megan Salon back in 2 wk, mid treatment

## 2020-06-25 NOTE — Interval H&P Note (Signed)
History and Physical Interval Note:  06/25/2020 1:26 PM  [name].  has presented today for surgery, with the diagnosis of Septic prepatella bursitis versus septic right total knee.  The various methods of treatment have been discussed with the patient and family. After consideration of risks, benefits and other options for treatment, the patient has consented to  Procedure(s): Open excisional and non excisional debridement right knee, possible aspiration versus open arthrotomy poly exchange (Right) as a surgical intervention.  The patient's history has been reviewed, patient examined, no change in status, stable for surgery.  I have reviewed the patient's chart and labs.  Questions were answered to the patient's satisfaction.     Mauri Pole

## 2020-06-25 NOTE — Transfer of Care (Signed)
Immediate Anesthesia Transfer of Care Note  Patient: [name].  Procedure(s) Performed: Open excisional and non excisional debridement right knee, possible aspiration versus open arthrotomy poly exchange (Right Knee)  Patient Location: PACU  Anesthesia Type:GA combined with regional for post-op pain  Level of Consciousness: awake, alert , oriented and patient cooperative  Airway & Oxygen Therapy: Patient Spontanous Breathing and Patient connected to face mask oxygen  Post-op Assessment: Report given to RN and Post -op Vital signs reviewed and stable  Post vital signs: Reviewed and stable  Last Vitals:  Vitals Value Taken Time  BP 137/92 06/25/20 1604  Temp    Pulse 66 06/25/20 1609  Resp 17 06/25/20 1609  SpO2 100 % 06/25/20 1609  Vitals shown include unvalidated device data.  Last Pain:  Vitals:   06/25/20 1435  TempSrc:   PainSc: 0-No pain      Patients Stated Pain Goal: 4 (16/38/46 6599)  Complications: No complications documented.

## 2020-06-25 NOTE — Anesthesia Procedure Notes (Signed)
Anesthesia Procedure Image    

## 2020-06-25 NOTE — Anesthesia Preprocedure Evaluation (Addendum)
Anesthesia Evaluation  Patient identified by MRN, date of birth, ID band Patient awake    Reviewed: Allergy & Precautions, NPO status , Patient's Chart, lab work & pertinent test results  Airway Mallampati: II  TM Distance: >3 FB Neck ROM: Full    Dental no notable dental hx.    Pulmonary sleep apnea ,    Pulmonary exam normal breath sounds clear to auscultation       Cardiovascular hypertension, Normal cardiovascular exam Rhythm:Regular Rate:Normal     Neuro/Psych TIAnegative psych ROS   GI/Hepatic Neg liver ROS, GERD  ,  Endo/Other  diabetesMorbid obesity  Renal/GU negative Renal ROS  negative genitourinary   Musculoskeletal negative musculoskeletal ROS (+)   Abdominal   Peds negative pediatric ROS (+)  Hematology negative hematology ROS (+)   Anesthesia Other Findings   Reproductive/Obstetrics negative OB ROS                            Anesthesia Physical Anesthesia Plan  ASA: III  Anesthesia Plan: General   Post-op Pain Management:  Regional for Post-op pain   Induction: Intravenous  PONV Risk Score and Plan: 2 and Ondansetron, Dexamethasone and Treatment may vary due to age or medical condition  Airway Management Planned: LMA  Additional Equipment:   Intra-op Plan:   Post-operative Plan: Extubation in OR  Informed Consent: I have reviewed the patients History and Physical, chart, labs and discussed the procedure including the risks, benefits and alternatives for the proposed anesthesia with the patient or authorized representative who has indicated his/her understanding and acceptance.     Dental advisory given  Plan Discussed with: CRNA and Surgeon  Anesthesia Plan Comments: (Patient 48 hours out from xarelto dose. )       Anesthesia Quick Evaluation

## 2020-06-25 NOTE — Plan of Care (Signed)
Plan of care for post-op day 0.

## 2020-06-25 NOTE — Anesthesia Procedure Notes (Signed)
Procedure Name: LMA Insertion Date/Time: 06/25/2020 2:49 PM Performed by: Raenette Rover, CRNA Pre-anesthesia Checklist: Patient identified, Emergency Drugs available, Suction available and Patient being monitored Patient Re-evaluated:Patient Re-evaluated prior to induction Oxygen Delivery Method: Circle system utilized Preoxygenation: Pre-oxygenation with 100% oxygen Induction Type: IV induction LMA: LMA with gastric port inserted LMA Size: 4.0 Number of attempts: 1 Placement Confirmation: positive ETCO2 and breath sounds checked- equal and bilateral Tube secured with: Tape Dental Injury: Teeth and Oropharynx as per pre-operative assessment

## 2020-06-25 NOTE — Progress Notes (Signed)
Pharmacy Antibiotic Note  [name]. is a 59 y.o. 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  Pharmacy has been consulted for Vancomycin dosing per ID.  Daptomycin preferred, checking for coverage.  Plan: Ancef 3gm IVPB x1 pre-op Vancomycin 1500mg  IVPB x1 pre-op, will add additional 1gm for total 2500mg  IV Vancomycin (Vanc 1gm powder also used in OR) Plan continue Vancomycin dosing in am 10/27, unless Daptomycin coverage can be confirmed   Height: 5\' 10"  (177.8 cm) Weight: 127.2 kg (280 lb 6.4 oz) IBW/kg (Calculated) : 73  Temp (24hrs), Avg:98.3 F (36.8 C), Min:97.7 F (36.5 C), Max:99 F (37.2 C)  Recent Labs  Lab 06/19/20 1500  WBC 7.6  CREATININE 1.07    Estimated Creatinine Clearance: 98.3 mL/min (by C-G formula based on SCr of 1.07 mg/dL).    Allergies  Allergen Reactions  . Morphine And Related Other (See Comments)    Hallucinations   Antimicrobials this admission: 1026 Ancef x1 10/26 Vancomycin 2500mg  total  Dose adjustments this admission:  Microbiology results:  Thank you for allowing pharmacy to be a part of this patient's care.  Minda Ditto PharmD 06/25/2020 6:17 PM

## 2020-06-25 NOTE — Progress Notes (Signed)
Assisted Dr. Rose with right, ultrasound guided, adductor canal block. Side rails up, monitors on throughout procedure. See vital signs in flow sheet. Tolerated Procedure well.  

## 2020-06-25 NOTE — Anesthesia Postprocedure Evaluation (Signed)
Anesthesia Post Note  Patient: [name].  Procedure(s) Performed: Open excisional and non excisional debridement right knee, possible aspiration versus open arthrotomy poly exchange (Right Knee)     Patient location during evaluation: PACU Anesthesia Type: General Level of consciousness: awake and alert Pain management: pain level controlled Vital Signs Assessment: post-procedure vital signs reviewed and stable Respiratory status: spontaneous breathing, nonlabored ventilation, respiratory function stable and patient connected to nasal cannula oxygen Cardiovascular status: blood pressure returned to baseline and stable Postop Assessment: no apparent nausea or vomiting Anesthetic complications: no   No complications documented.  Last Vitals:  Vitals:   06/25/20 1615 06/25/20 1630  BP: (!) 115/92 (!) 136/94  Pulse: (!) 45 (!) 55  Resp: 13 19  Temp:    SpO2: 100% 99%    Last Pain:  Vitals:   06/25/20 1630  TempSrc:   PainSc: 3                  Weldon Nouri S

## 2020-06-25 NOTE — Discharge Instructions (Signed)

## 2020-06-25 NOTE — Anesthesia Procedure Notes (Signed)
Anesthesia Regional Block: Adductor canal block   Pre-Anesthetic Checklist: ,, timeout performed, Correct Patient, Correct Site, Correct Laterality, Correct Procedure, Correct Position, site marked, Risks and benefits discussed,  Surgical consent,  Pre-op evaluation,  At surgeon's request and post-op pain management  Laterality: Right  Prep: chloraprep       Needles:  Injection technique: Single-shot  Needle Type: Echogenic Needle     Needle Length: 9cm      Additional Needles:   Procedures:,,,, ultrasound used (permanent image in chart),,,,  Narrative:  Start time: 06/25/2020 2:26 PM End time: 06/25/2020 2:32 PM Injection made incrementally with aspirations every 5 mL.  Performed by: Personally  Anesthesiologist: Myrtie Soman, MD  Additional Notes: Patient tolerated the procedure well without complications

## 2020-06-26 ENCOUNTER — Other Ambulatory Visit (HOSPITAL_COMMUNITY): Payer: Self-pay | Admitting: Orthopedic Surgery

## 2020-06-26 ENCOUNTER — Encounter (HOSPITAL_COMMUNITY): Payer: Self-pay | Admitting: Orthopedic Surgery

## 2020-06-26 ENCOUNTER — Observation Stay (HOSPITAL_COMMUNITY): Payer: 59

## 2020-06-26 DIAGNOSIS — Z96642 Presence of left artificial hip joint: Secondary | ICD-10-CM | POA: Diagnosis not present

## 2020-06-26 DIAGNOSIS — Z8673 Personal history of transient ischemic attack (TIA), and cerebral infarction without residual deficits: Secondary | ICD-10-CM | POA: Diagnosis not present

## 2020-06-26 DIAGNOSIS — I10 Essential (primary) hypertension: Secondary | ICD-10-CM | POA: Diagnosis not present

## 2020-06-26 DIAGNOSIS — Z79899 Other long term (current) drug therapy: Secondary | ICD-10-CM | POA: Diagnosis not present

## 2020-06-26 DIAGNOSIS — T8453XA Infection and inflammatory reaction due to internal right knee prosthesis, initial encounter: Secondary | ICD-10-CM | POA: Diagnosis not present

## 2020-06-26 DIAGNOSIS — E119 Type 2 diabetes mellitus without complications: Secondary | ICD-10-CM | POA: Diagnosis not present

## 2020-06-26 DIAGNOSIS — Z7901 Long term (current) use of anticoagulants: Secondary | ICD-10-CM | POA: Diagnosis not present

## 2020-06-26 DIAGNOSIS — Z7984 Long term (current) use of oral hypoglycemic drugs: Secondary | ICD-10-CM | POA: Diagnosis not present

## 2020-06-26 DIAGNOSIS — I517 Cardiomegaly: Secondary | ICD-10-CM | POA: Diagnosis not present

## 2020-06-26 DIAGNOSIS — Z96653 Presence of artificial knee joint, bilateral: Secondary | ICD-10-CM | POA: Diagnosis not present

## 2020-06-26 LAB — CULTURE, BLOOD (SINGLE)
MICRO NUMBER:: 11106788
MICRO NUMBER:: 11106789

## 2020-06-26 LAB — BASIC METABOLIC PANEL
Anion gap: 10 (ref 5–15)
BUN: 21 mg/dL — ABNORMAL HIGH (ref 6–20)
CO2: 22 mmol/L (ref 22–32)
Calcium: 8.5 mg/dL — ABNORMAL LOW (ref 8.9–10.3)
Chloride: 101 mmol/L (ref 98–111)
Creatinine, Ser: 1.12 mg/dL (ref 0.61–1.24)
GFR, Estimated: >60 mL/min (ref >60)
Glucose, Bld: 212 mg/dL — ABNORMAL HIGH (ref 70–99)
Potassium: 4.5 mmol/L (ref 3.5–5.1)
Sodium: 133 mmol/L — ABNORMAL LOW (ref 135–145)

## 2020-06-26 LAB — CBC
HCT: 48.5 % (ref 39.0–52.0)
Hemoglobin: 16.8 g/dL (ref 13.0–17.0)
MCH: 31.8 pg (ref 26.0–34.0)
MCHC: 34.6 g/dL (ref 30.0–36.0)
MCV: 91.9 fL (ref 80.0–100.0)
Platelets: 172 10*3/uL (ref 150–400)
RBC: 5.28 MIL/uL (ref 4.22–5.81)
RDW: 13.2 % (ref 11.5–15.5)
WBC: 8.7 10*3/uL (ref 4.0–10.5)
nRBC: 0 % (ref 0.0–0.2)

## 2020-06-26 LAB — GLUCOSE, CAPILLARY
Glucose-Capillary: 219 mg/dL — ABNORMAL HIGH (ref 70–99)
Glucose-Capillary: 213 mg/dL — ABNORMAL HIGH (ref 70–99)

## 2020-06-26 LAB — CK: Total CK: 50 U/L (ref 49–397)

## 2020-06-26 MED ORDER — SODIUM CHLORIDE 0.9% FLUSH: 10.0000 mL | INTRAVENOUS | Status: DC | PRN

## 2020-06-26 MED ORDER — SODIUM CHLORIDE 0.9% FLUSH
10.0000 mL | Freq: Two times a day (BID) | INTRAVENOUS | Status: DC
  Administered 2020-06-26: 10 mL

## 2020-06-26 MED ORDER — SODIUM CHLORIDE 0.9 % IV SOLN
760.0000 mg | Freq: Every day | INTRAVENOUS | Status: DC
  Administered 2020-06-26: 760 mg via INTRAVENOUS
  Filled 2020-06-26: qty 15.2

## 2020-06-26 MED ORDER — CHLORHEXIDINE GLUCONATE CLOTH 2 % EX PADS: 6.0000 | MEDICATED_PAD | Freq: Every day | CUTANEOUS | Status: DC

## 2020-06-26 MED ORDER — FERROUS SULFATE 325 (65 FE) MG PO TABS: 325.0000 mg | ORAL_TABLET | Freq: Three times a day (TID) | ORAL | 0 refills | Status: DC

## 2020-06-26 MED ORDER — OXYCODONE HCL 5 MG PO TABS
5.0000 mg | ORAL_TABLET | ORAL | 0 refills | Status: DC | PRN
Start: 2020-06-26 — End: 2020-07-09

## 2020-06-26 MED ORDER — DAPTOMYCIN IV (FOR PTA / DISCHARGE USE ONLY): 760.0000 mg | INTRAVENOUS | 0 refills | Status: AC

## 2020-06-26 MED ORDER — METHOCARBAMOL 500 MG PO TABS: 500.0000 mg | ORAL_TABLET | Freq: Four times a day (QID) | ORAL | 0 refills | Status: DC | PRN

## 2020-06-26 MED ORDER — VANCOMYCIN HCL IN DEXTROSE 1-5 GM/200ML-% IV SOLN: 1000.0000 mg | Freq: Two times a day (BID) | INTRAVENOUS | Status: DC

## 2020-06-26 MED ORDER — POLYETHYLENE GLYCOL 3350 17 G PO PACK: 17.0000 g | PACK | Freq: Two times a day (BID) | ORAL | 0 refills | Status: DC

## 2020-06-26 MED ORDER — DOCUSATE SODIUM 100 MG PO CAPS: 100.0000 mg | ORAL_CAPSULE | Freq: Two times a day (BID) | ORAL | 0 refills | Status: DC

## 2020-06-26 MED ORDER — ACETAMINOPHEN 500 MG PO TABS: 1000.0000 mg | ORAL_TABLET | Freq: Three times a day (TID) | ORAL | 0 refills | Status: DC

## 2020-06-26 MED ORDER — HEPARIN SOD (PORK) LOCK FLUSH 100 UNIT/ML IV SOLN
250.0000 [IU] | INTRAVENOUS | Status: AC | PRN
  Administered 2020-06-26: 250 [IU]
  Filled 2020-06-26: qty 2.5

## 2020-06-26 MED FILL — oxyCODONE HCL 5 MG TABS: 5 | 5 days supply | Qty: 60 | Fill #0

## 2020-06-26 MED FILL — METHOCARBAMOL 500 MG TABS: 500 | 10 days supply | Qty: 40 | Fill #0

## 2020-06-26 NOTE — Progress Notes (Signed)
     Subjective: 1 Day Post-Op Procedure(s) (LRB): Open excisional and non excisional debridement right knee, possible aspiration versus open arthrotomy poly exchange (Right)    Seen by Dr. Alvan Dame. Patient reports pain as moderate, controlled with medication.  No reported events throughout the night. Patient does have significant swelling within the right knee which is causing oozing outside of the suture line.  Dr. Alvan Dame discussed various options and he does wish to attempt to aspirate some of the fluid.  Infectious disease has already seen the patient and come up with a plan.  PICC line should be placed today.     Objective:   VITALS:   Vitals:   06/26/20 0152 06/26/20 0547  BP: (!) 102/59 116/62  Pulse: (!) 57 75  Resp: 17 17  Temp: 98.3 F (36.8 C) 98.4 F (36.9 C)  SpO2: 94% 93%    Dorsiflexion/Plantar flexion intact Incision: moderate drainage No cellulitis present Compartment soft  LABS Recent Labs    06/26/20 0305  HGB 16.8  HCT 48.5  WBC 8.7  PLT 172    Recent Labs    06/26/20 0305  NA 133*  K 4.5  BUN 21*  CREATININE 1.12  GLUCOSE 212*     Assessment/Plan: 1 Day Post-Op Procedure(s) (LRB): Open excisional and non excisional debridement right knee, possible aspiration versus open arthrotomy poly exchange (Right)  Foley cath DC'd Advance diet Up with therapy D/C IV fluids Discharge home with home health   Dr. Alvan Dame attempted to aspirate the right knee, but the fluid was too thick to pull through the needle.  The area was bandaged after being cleaned.  Patient will continue with a bulky dressing to collect any drainage.  Discussion was had with regards to returning to the OR to clean out the knee, but due to the patient being on Xarelto his results would be similar to his current experience.  We will hold the Xarelto for today and give him a dose of Tranexamic acid to see if this will help decrease his oozing/drainage.        Danae Orleans  PA-C  [facility]  [place] [address], [place], [place], [place] [address] Phone: [phone] [email or web address] Facebook  [employer]

## 2020-06-26 NOTE — TOC Transition Note (Signed)
Transition of Care [facility]) - CM/SW Discharge Note   Patient Details  Name: [name]. MRN: [id number] Date of Birth: Jul 13, 1961  [facility]) CM/SW Contact:  Lennart Pall, LCSW Phone Number: 06/26/2020, 12:59 PM   Clinical Narrative:    Met with pt and wife to review dc needs.  Anticipating dc today following IV abx dosing.  Pt and wife deny any concerns about dc as they have had several experiences with managing home IV abx.   Referrals placed with [facility] for IV abx and [facility] for [facility].  No further needs.   Final next level of care: [place] Barriers to Discharge: Barriers Resolved   Patient Goals and CMS Choice Patient states their goals for this hospitalization and ongoing recovery are:: return home today      Discharge Placement                       Discharge Plan and Services                DME Arranged: N/A DME Agency: NA       HH Arranged: RN, IV Antibiotics HH Agency: Tour manager (and [facility]) Date HH Agency Contacted: 06/26/20 Time HH Agency Contacted: 1000 Representative spoke with at [place]: [place] ([place]) Interventions     Readmission Risk Interventions No flowsheet data found.

## 2020-06-26 NOTE — Progress Notes (Signed)
PHARMACY CONSULT NOTE FOR:  OUTPATIENT  PARENTERAL ANTIBIOTIC THERAPY (OPAT)  Indication: PJI/Bursitis Regimen: Daptomycin 760mg  IV q24h End date: 07/23/2020  IV antibiotic discharge orders are pended. To discharging provider:  please sign these orders via discharge navigator,  Select New Orders & click on the button choice - Manage This Unsigned Work.     Thank you for allowing pharmacy to be a part of this patient's care.  Phillis Haggis 06/26/2020, 12:45 PM

## 2020-06-26 NOTE — Plan of Care (Signed)
  Problem: Education: Goal: Knowledge of the prescribed therapeutic regimen will improve Outcome: Progressing Goal: Individualized Educational Video(s) Outcome: Progressing   Problem: Activity: Goal: Ability to avoid complications of mobility impairment will improve Outcome: Progressing Goal: Range of joint motion will improve Outcome: Progressing   Problem: Clinical Measurements: Goal: Postoperative complications will be avoided or minimized Outcome: Progressing   Problem: Pain Management: Goal: Pain level will decrease with appropriate interventions Outcome: Progressing   Problem: Education: Goal: Knowledge of General Education information will improve Description: Including pain rating scale, medication(s)/side effects and non-pharmacologic comfort measures Outcome: Progressing   Problem: Health Behavior/Discharge Planning: Goal: Ability to manage health-related needs will improve Outcome: Progressing   Problem: Clinical Measurements: Goal: Ability to maintain clinical measurements within normal limits will improve Outcome: Progressing Goal: Will remain free from infection Outcome: Progressing Goal: Diagnostic test results will improve Outcome: Progressing Goal: Respiratory complications will improve Outcome: Progressing Goal: Cardiovascular complication will be avoided Outcome: Progressing   Problem: Activity: Goal: Risk for activity intolerance will decrease Outcome: Progressing   Problem: Nutrition: Goal: Adequate nutrition will be maintained Outcome: Progressing   Problem: Coping: Goal: Level of anxiety will decrease Outcome: Progressing   Problem: Elimination: Goal: Will not experience complications related to bowel motility Outcome: Progressing Goal: Will not experience complications related to urinary retention Outcome: Progressing   Problem: Pain Managment: Goal: General experience of comfort will improve Outcome: Progressing   Problem: Skin  Integrity: Goal: Risk for impaired skin integrity will decrease Outcome: Progressing   Problem: Safety: Goal: Ability to remain free from injury will improve Outcome: Progressing

## 2020-06-26 NOTE — Progress Notes (Signed)
PT Cancellation Note  Patient Details Name: [name]. MRN: [id number] DOB: 14-Oct-1960   Cancelled Treatment:    Reason Eval/Treat Not Completed: PT screened, no needs identified, will sign off. Pt reported he has had therapy multiple times for his knee and does not feel he needs therapy at this time. I confirmed he does have all necessary equipment and verbally reviewed step training with pt and spouse. Reinforced WBAT and mobility using a RW. Pt is d/c from PT services.   Lelon Mast 06/26/2020, 9:50 AM

## 2020-06-26 NOTE — Progress Notes (Signed)
Call from Dr. Alvan Dame to hold xarelto today r/t dressing saturated with blood. Dressing is dry at this time. Will continue to watch patient closely and monitor for bleeding.

## 2020-06-26 NOTE — Progress Notes (Signed)
Patient in stable condition upon my arrival.  Now eating breakfast with visitor in the room.  States pain level 3 on 0-10 scale.  Patient states pain is manageable right now and does not want pain.  Patient fine with repositioning.  VSS.  Will continue to monitor for pain.

## 2020-06-26 NOTE — Plan of Care (Signed)
  Problem: Education: Goal: Knowledge of General Education information will improve Description Including pain rating scale, medication(s)/side effects and non-pharmacologic comfort measures Outcome: Progressing   

## 2020-06-26 NOTE — Progress Notes (Signed)
Patient in stable condition.  PICC line inserted by PICC nurse and tolerating well.  Xray completed to confirm placement.  IV ABT hung and running well.  Pt. States pain level now only at suture site with movement.

## 2020-06-26 NOTE — Progress Notes (Addendum)
Pharmacy Antibiotic Note  [name]. is a 59 y.o. 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  Pharmacy has been consulted for daptomycin dosing per ID. Patient prefers to pay for the daptomycin on the outpatient side despite the higher cost so we will go with that option as it is equally efficacious and has a lower risk for toxicity.   Plan: Start daptomycin 760mg  IV q24h  Planning for 4 weeks of therapy Will place OPAT orders   Height: 5\' 10"  (177.8 cm) Weight: 127.2 kg (280 lb 6.4 oz) IBW/kg (Calculated) : 73  Temp (24hrs), Avg:98.1 F (36.7 C), Min:97.7 F (36.5 C), Max:99 F (37.2 C)  Recent Labs  Lab 06/19/20 1500 06/26/20 0305  WBC 7.6 8.7  CREATININE 1.07 1.12    Estimated Creatinine Clearance: 93.9 mL/min (by C-G formula based on SCr of 1.12 mg/dL).    Allergies  Allergen Reactions  . Morphine And Related Other (See Comments)    Hallucinations   Antimicrobials this admission: 1026 Ancef x1 10/26 Vancomycin 2500mg  total Daptomycin 10/27>>  Dose adjustments this admission:  Microbiology results: MRSE in culture from ortho office Was on keflex PTA   Thank you for allowing pharmacy to be a part of this patient's care.  Phillis Haggis PharmD 06/26/2020 11:51 AM

## 2020-06-27 DIAGNOSIS — T8459XA Infection and inflammatory reaction due to other internal joint prosthesis, initial encounter: Secondary | ICD-10-CM | POA: Diagnosis not present

## 2020-06-27 DIAGNOSIS — T8453XA Infection and inflammatory reaction due to internal right knee prosthesis, initial encounter: Secondary | ICD-10-CM | POA: Diagnosis not present

## 2020-06-27 DIAGNOSIS — A4901 Methicillin susceptible Staphylococcus aureus infection, unspecified site: Secondary | ICD-10-CM | POA: Diagnosis not present

## 2020-06-27 NOTE — Brief Op Note (Signed)
06/25/2020  11:32 AM  PATIENT:  [name].  59 y.o. 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  POST-OPERATIVE DIAGNOSIS:  Septic prepatella bursitis versus septic right total knee  PROCEDURE:  Procedure(s): Open excisional and non excisional debridement right knee, possible aspiration versus open arthrotomy poly exchange (Right)  SURGEON:  Surgeon(s) and Role:    [name], MD - Primary  PHYSICIAN ASSISTANT: Griffith Citron, PA-C  ANESTHESIA:   general  EBL:  50 mL   BLOOD ADMINISTERED:none  DRAINS: none   LOCAL MEDICATIONS USED:  NONE  SPECIMEN:  No Specimen  DISPOSITION OF SPECIMEN:  N/A  COUNTS:  YES  TOURNIQUET:   Total Tourniquet Time Documented: Thigh (Right) - 13 minutes Total: Thigh (Right) - 13 minutes   DICTATION: .Other Dictation: Dictation Number [id number]  PLAN OF CARE: Admit to inpatient   PATIENT DISPOSITION:  PACU - hemodynamically stable.   Delay start of Pharmacological VTE agent (>24hrs) due to surgical blood loss or risk of bleeding: no

## 2020-06-27 NOTE — Op Note (Signed)
NAME: Andres, Bantz MEDICAL RECORD QQ:[id number] ACCOUNT [id number] DATE OF BIRTH:August 09, 1961 FACILITY: [facility] LOCATION: [facility] PHYSICIAN:Darwin Rothlisberger Marian Sorrow, MD  OPERATIVE REPORT  DATE OF PROCEDURE:  06/25/2020  PREOPERATIVE DIAGNOSIS:  Prepatellar sepsis following total knee reimplantation.  POSTOPERATIVE DIAGNOSIS:  Prepatellar sepsis following total knee reimplantation.  FINDINGS:  Please see dictated operative note for both indications as well as intraoperative findings.  PROCEDURE:  Open excisional and non-excisional debridement of the prepatellar space of a right knee with history of knee arthroplasty.  SURGEON:  [name], MD  ASSISTANT:  Griffith Citron, PA-C.  Note that Ms. Nehemiah Settle was present for the entirety of the case from preoperative positioning, perioperative management of the operative extremity, general facilitation of the case and primary wound closure.  ANESTHESIA:  General.  SPECIMENS:  None.  COMPLICATIONS:  None.  DRAINS:  None.  TOURNIQUET:  Up for a total of 13 minutes at 250 mmHg.  INDICATIONS:  The patient is a very pleasant 59 year old gentleman with history of bilateral staged total knee arthroplasties.  Fortunately, his left knee has done exceptionally well throughout his postoperative period; however, his right knee, developed  an infection during the recovery of his left knee.  He has undergone multiple surgical procedures at trying to clean his knee, including a couple different stages of repeat washing out his knee prior to reimplanting his right total knee about 10 months  ago.  In the initial postoperative period, he was noted to have some prepatellar swelling, which was drained.  However, over the past 9 months or so since his initial recovery, he has been doing quite well.  However, a week prior to presentation in the  office, he began having some achiness without overt fevers.  He was seen and evaluated in the office and noted to have  swelling in the prepatellar space.  I was unable to palpate the significant effusion inside his joint.  In the office,  I did aspirate  this prepatellar space and removed about 60 mL of yellow synovial fluid.  Despite the fact that it look normal  based on his history, I did send this off to be evaluated.  Lo and behold, it came back with a positivity of the Staph species and grew out  Staph epidermidis that was sensitive.  Given these findings, I felt it was very important in an effort to try to maintain a concerted effort at joint preservation given what he had been through that we needed to take him to the operating room.  I am  worried about doing anything deep if this was a superficial based finding, to try to prevent and reduction of any infection within the joint.  We discussed opening up this prepatellar space, debriding it using antibiotics postoperatively and following  him from there.    Consent was obtained for management of the infection as noted and identified and reviewed with him.  Concern and risk for recurrent or persistent infection or intraarticular infection discussed and reviewed.  Consent was obtained.  DESCRIPTION OF PROCEDURE:  The patient was brought to the operative theater.  Once adequate anesthesia, preoperative antibiotics, Ancef administered as well as vancomycin, he was positioned supine with a right thigh tourniquet in place.  The right lower  extremity was prepped and draped in sterile fashion.  A timeout was performed identifying the patient, the planned procedure and extremity.  His leg was exsanguinated, tourniquet elevated to 250 mmHg.  His old incision was excised as a portion of it.  We  encountered the same clear synovial looking fluid as we entered the space, as well as some evidence of some fibrous tissue in there.  He was noted to have pseudocapsule examination of the soft tissues in this area.  Once I opened up the knee and exposed  it, I did decide to remove  some of the pseudocapsular tissue to allow for a more profound blood supply to this region so antibiotics could reach it and provide treatment.  We did all this with the tourniquet down.  Once this was done, we irrigated the  knee with 3 L of normal saline solution.  We then placed approximately 300 mL of the IrriSept chlorhexidine solution into this prepatellar space and let it sit for 5 minutes.  Once this was done, we irrigated the knee with another 3 L of normal saline  solution in this prepatellar space.  The tourniquet was let down after 13 minutes.  We did note a significant amount of punctate bleeding that was not able to be all cauterized.  We subsequently reapproximated the subcutaneous space using 2-0 Vicryl and  a running Monocryl stitch.  The knee was then dressed into a bulky dressing with Aquacel dressing in place.  He was admitted to the floor for at least a night of observation.  We will order a PICC line.  We will consult our colleagues with infectious  disease to help with appropriate antibiotic for treatment.  Findings were reviewed with his wife.  I will see him back in the office to review his progress.  VN/NUANCE  D:06/27/2020 T:06/27/2020 JOB:[id number]

## 2020-06-28 ENCOUNTER — Telehealth: Payer: Self-pay

## 2020-06-28 NOTE — Telephone Encounter (Signed)
Patient accepts appointment with Dr. Megan Salon. [name]

## 2020-06-28 NOTE — Telephone Encounter (Signed)
-----   Message from Carlyle Basques, MD sent at 06/28/2020  4:11 PM EDT ----- Can he see dr Megan Salon in 2 wk

## 2020-06-30 NOTE — Discharge Summary (Signed)
Patient ID: [name]. MRN: [id number] DOB/AGE: 02/22/1961 59 y.o.  Admit date: 06/25/2020 Discharge date: 06/30/2020  Admission Diagnoses:  Active Problems:   Infection of prosthetic right knee joint [facility])   Discharge Diagnoses:  Same  Past Medical History:  Diagnosis Date  . Arthritis   . Diabetes mellitus without complication ([place])    TYPE 2  . Diverticulosis   . Dyslipidemia   . GERD (gastroesophageal reflux disease)   . Herpes labialis   . Hypertension   . Longstanding persistent atrial fibrillation ([place])   . Metabolic syndrome   . Obesity   . Proteinuria   . Psoriasis   . Seborrheic dermatitis   . Sleep apnea    very compliant with CPAP, PT NEEDS TO BRING OWN MACHINE  . TIA (transient ischemic attack) 2009    Surgeries: Procedure(s): Open excisional and non excisional debridement right knee, possible aspiration versus open arthrotomy poly exchange on 06/25/2020   Consultants: Infectious Disease  Discharged Condition: Improved  Hospital Course: [name]. is an 59 y.o. male who was admitted 06/25/2020 for operative treatment of infection of the right TKA. Patient has severe unremitting pain that affects sleep, daily activities, and work/hobbies. After pre-op clearance the patient was taken to the operating room on 06/25/2020 and underwent  Procedure(s): Open excisional and non excisional debridement right knee, possible aspiration versus open arthrotomy poly exchange.    Patient was given perioperative antibiotics:  Anti-infectives (From admission, onward)   Start     Dose/Rate Route Frequency Ordered Stop   06/26/20 1800  vancomycin (VANCOCIN) IVPB 1000 mg/200 mL premix  Status:  Discontinued        1,000 mg 200 mL/hr over 60 Minutes Intravenous Every 12 hours 06/26/20 1154 06/26/20 1242   06/26/20 1330  DAPTOmycin (CUBICIN) 760 mg in sodium chloride 0.9 % IVPB  Status:  Discontinued        760 mg 230.4 mL/hr over 30 Minutes Intravenous  Daily 06/26/20 1242 06/26/20 2109   06/26/20 0000  daptomycin (CUBICIN) IVPB        760 mg Intravenous Every 24 hours 06/26/20 1358 07/23/20 2359   06/25/20 2000  vancomycin (VANCOCIN) IVPB 1000 mg/200 mL premix        1,000 mg 200 mL/hr over 60 Minutes Intravenous  Once 06/25/20 1820 06/26/20 0701   06/25/20 1519  vancomycin (VANCOCIN) powder  Status:  Discontinued          As needed 06/25/20 1519 06/25/20 1725   06/25/20 1330  vancomycin (VANCOREADY) IVPB 1500 mg/300 mL        1,500 mg 150 mL/hr over 120 Minutes Intravenous  Once 06/25/20 1319 06/25/20 1725   06/25/20 1330  ceFAZolin (ANCEF) 3 g in dextrose 5 % 50 mL IVPB        3 g 100 mL/hr over 30 Minutes Intravenous  Once 06/25/20 1319 06/25/20 1454       Patient was given sequential compression devices, early ambulation, and chemoprophylaxis to prevent DVT.  Patient benefited maximally from hospital stay and there were no complications.    Recent vital signs: No data found.   Recent laboratory studies: No results for input(s): WBC, HGB, HCT, PLT, NA, K, CL, CO2, BUN, CREATININE, GLUCOSE, INR, CALCIUM in the last 72 hours.  Invalid input(s): PT, 2   Discharge Medications:   Allergies as of 06/26/2020      Reactions   Morphine And Related Other (See Comments)   Hallucinations  Medication List    STOP taking these medications   cephALEXin 500 MG capsule Commonly known as: KEFLEX     TAKE these medications   acetaminophen 500 MG tablet Commonly known as: TYLENOL Take 2 tablets (1,000 mg total) by mouth every 8 (eight) hours.   carvedilol 25 MG tablet Commonly known as: COREG TAKE 1 TABLET BY MOUTH 2 TIMES DAILY. What changed: when to take this   daptomycin  IVPB Commonly known as: CUBICIN Inject 760 mg into the vein daily for 27 days. Indication:  PJI/Bursitis First Dose: No Last Day of Therapy:  07/23/2020 Labs - Once weekly:  CBC/D, BMP, and CPK Labs - Every other week:  ESR and CRP Method of  administration: IV Push Method of administration may be changed at the discretion of home infusion pharmacist based upon assessment of the patient and/or caregiver's ability to self-administer the medication ordered.   docusate sodium 100 MG capsule Commonly known as: Colace Take 1 capsule (100 mg total) by mouth 2 (two) times daily.   Entresto 24-26 MG Generic drug: sacubitril-valsartan TAKE 1 TABLET BY MOUTH TWICE A DAY   ferrous sulfate 325 (65 FE) MG tablet Commonly known as: FerrouSul Take 1 tablet (325 mg total) by mouth 3 (three) times daily with meals for 14 days.   freestyle lancets 1 each by Other route as needed. Use as instructed   glucose blood test strip 1 each by Other route as needed. Use as instructed Freestyle test stripts   glucose monitoring kit monitoring kit 1 each by Does not apply route as needed.   Jardiance 10 MG Tabs tablet Generic drug: empagliflozin TAKE 1 TABLET BY MOUTH ONCE DAILY What changed: how much to take   methocarbamol 500 MG tablet Commonly known as: Robaxin Take 1 tablet (500 mg total) by mouth every 6 (six) hours as needed for muscle spasms.   multivitamin with minerals Tabs tablet Take 1 tablet by mouth daily.   oxyCODONE 5 MG immediate release tablet Commonly known as: Oxy IR/ROXICODONE Take 1-2 tablets (5-10 mg total) by mouth every 4 (four) hours as needed for moderate pain or severe pain.   oxymetazoline 0.05 % nasal spray Commonly known as: AFRIN Place 1 spray into both nostrils at bedtime.   pantoprazole 40 MG tablet Commonly known as: PROTONIX Take 1 tablet (40 mg total) by mouth 2 (two) times daily. What changed: when to take this   polyethylene glycol 17 g packet Commonly known as: MIRALAX / GLYCOLAX Take 17 g by mouth 2 (two) times daily.   rosuvastatin 10 MG tablet Commonly known as: CRESTOR TAKE 1 TABLET (10 MG TOTAL) BY MOUTH DAILY. What changed: when to take this   Taltz 80 MG/ML Soaj Generic drug:  Ixekizumab Inject 80 mg into the skin every 28 (twenty-eight) days. Inject below the skin as directed; inject every 4 weeks.   Vitamin D (Cholecalciferol) 25 MCG (1000 UT) Caps Take 1,000 Units by mouth daily.   Xarelto 20 MG Tabs tablet Generic drug: rivaroxaban TAKE 1 TABLET BY MOUTH DAILY WITH SUPPER. What changed: See the new instructions.            Discharge Care Instructions  (From admission, onward)         Start     Ordered   06/26/20 0000  Change dressing on IV access line weekly and PRN  (Home infusion instructions - [facility] )        06/26/20 1358   06/26/20 0000  Change dressing       Comments: Maintain dressing unless it saturates through then replace with another bulky dressing.  Keep the area otherwise clean and dry.   Call with any questions or concerns.   06/26/20 1409          Diagnostic Studies: DG CHEST PORT 1 VIEW  Result Date: 06/26/2020 CLINICAL DATA:  Status post PICC placement. EXAM: PORTABLE CHEST 1 VIEW COMPARISON:  June 15, 2019. FINDINGS: Stable cardiomegaly. No pneumothorax or pleural effusion is noted. Right-sided PICC line is noted with distal tip in expected position of the SVC. Both lungs are clear. The visualized skeletal structures are unremarkable. IMPRESSION: Right-sided PICC line in good position. No acute cardiopulmonary abnormality seen. Electronically Signed   By: Marijo Conception M.D.   On: 06/26/2020 13:56   [place] EKG SITE RITE  Result Date: 06/25/2020 If Site Rite image not attached, placement could not be confirmed due to current cardiac rhythm.   Disposition: Home  Discharge Instructions    [facility] pharmacist to adjust dose for Vancomycin, Aminoglycosides and other anti-infective therapies as requested by physician.   Complete by: As directed    [facility] to provide Cath Flo 39m   Complete by: As directed    Administer for PICC line occlusion and as ordered by physician for other  access device issues.   Anaphylaxis Kit: Provided to treat any anaphylactic reaction to the medication being provided to the patient if First Dose or when requested by physician   Complete by: As directed    Epinephrine 155mml vial / amp: Administer 0.54m78m0.54ml56mubcutaneously once for moderate to severe anaphylaxis, nurse to call physician and pharmacy when reaction occurs and call 911 if needed for immediate care   Diphenhydramine 50mg54mIV vial: Administer 25-50mg 94mM PRN for first dose reaction, rash, itching, mild reaction, nurse to call physician and pharmacy when reaction occurs   Sodium Chloride 0.9% NS 500ml I32mdminister if needed for hypovolemic blood pressure drop or as ordered by physician after call to physician with anaphylactic reaction   Call MD / Call 911   Complete by: As directed    If you experience chest pain or shortness of breath, CALL 911 and be transported to the hospital emergency room.  If you develope a fever above 101 F, pus (white drainage) or increased drainage or redness at the wound, or calf pain, call your surgeon's office.   Change dressing   Complete by: As directed    Maintain dressing unless it saturates through then replace with another bulky dressing.  Keep the area otherwise clean and dry.   Call with any questions or concerns.   Change dressing on IV access line weekly and PRN   Complete by: As directed    Constipation Prevention   Complete by: As directed    Drink plenty of fluids.  Prune juice may be helpful.  You may use a stool softener, such as Colace (over the counter) 100 mg twice a day.  Use MiraLax (over the counter) for constipation as needed.   Diet - low sodium heart healthy   Complete by: As directed    Discharge instructions   Complete by: As directed    Maintain dressing unless it saturates through then replace with another bulky dressing.  Keep the area otherwise clean and dry.   Call with any questions or concerns. [facility].  Flush IV access with Sodium Chloride 0.9% and Heparin 10 units/ml or 100 units/ml   Complete by: As directed    Home infusion instructions - [facility]   Complete by: As directed    Instructions: Flush IV access with Sodium Chloride 0.9% and Heparin 10units/ml or 100units/ml   Change dressing on IV access line: Weekly and PRN   Instructions Cath Flo 50m: Administer for PICC Line occlusion and as ordered by physician for other access device   [facility] pharmacist to adjust dose for: Vancomycin, Aminoglycosides and other anti-infective therapies as requested by physician   Method of administration may be changed at the discretion of home infusion pharmacist based upon assessment of the patient and/or caregiver's ability to self-administer the medication ordered   Complete by: As directed    TED hose   Complete by: As directed    Use stockings (TED hose) for 2 weeks on both leg(s).  You may remove them at night for sleeping.       Follow-up Information    OParalee Cancel MD. Schedule an appointment as soon as possible for a visit in 2 weeks.   Specialty: Orthopedic Surgery Contact information: [address] [address] StreetSL[phone]               Signed: MLucille PassyBArrowhead Behavioral Health10/31/2021, 5:00 PM

## 2020-07-01 DIAGNOSIS — T8459XA Infection and inflammatory reaction due to other internal joint prosthesis, initial encounter: Secondary | ICD-10-CM | POA: Diagnosis not present

## 2020-07-01 DIAGNOSIS — T8453XA Infection and inflammatory reaction due to internal right knee prosthesis, initial encounter: Secondary | ICD-10-CM | POA: Diagnosis not present

## 2020-07-01 DIAGNOSIS — A4901 Methicillin susceptible Staphylococcus aureus infection, unspecified site: Secondary | ICD-10-CM | POA: Diagnosis not present

## 2020-07-03 ENCOUNTER — Encounter: Payer: Self-pay | Admitting: Internal Medicine

## 2020-07-03 DIAGNOSIS — Z96659 Presence of unspecified artificial knee joint: Secondary | ICD-10-CM | POA: Diagnosis not present

## 2020-07-03 DIAGNOSIS — A4901 Methicillin susceptible Staphylococcus aureus infection, unspecified site: Secondary | ICD-10-CM | POA: Diagnosis not present

## 2020-07-03 DIAGNOSIS — T8459XA Infection and inflammatory reaction due to other internal joint prosthesis, initial encounter: Secondary | ICD-10-CM | POA: Diagnosis not present

## 2020-07-03 DIAGNOSIS — T8453XA Infection and inflammatory reaction due to internal right knee prosthesis, initial encounter: Secondary | ICD-10-CM | POA: Diagnosis not present

## 2020-07-05 DIAGNOSIS — Z4789 Encounter for other orthopedic aftercare: Secondary | ICD-10-CM | POA: Diagnosis not present

## 2020-07-05 DIAGNOSIS — M25562 Pain in left knee: Secondary | ICD-10-CM | POA: Diagnosis not present

## 2020-07-06 DIAGNOSIS — A4901 Methicillin susceptible Staphylococcus aureus infection, unspecified site: Secondary | ICD-10-CM | POA: Diagnosis not present

## 2020-07-06 DIAGNOSIS — T8459XA Infection and inflammatory reaction due to other internal joint prosthesis, initial encounter: Secondary | ICD-10-CM | POA: Diagnosis not present

## 2020-07-06 DIAGNOSIS — T8453XA Infection and inflammatory reaction due to internal right knee prosthesis, initial encounter: Secondary | ICD-10-CM | POA: Diagnosis not present

## 2020-07-08 ENCOUNTER — Ambulatory Visit (INDEPENDENT_AMBULATORY_CARE_PROVIDER_SITE_OTHER): Payer: 59 | Admitting: Internal Medicine

## 2020-07-08 ENCOUNTER — Encounter: Payer: Self-pay | Admitting: Internal Medicine

## 2020-07-08 ENCOUNTER — Other Ambulatory Visit: Payer: Self-pay

## 2020-07-08 VITALS — BP 138/91 | HR 99 | Temp 99.0°F

## 2020-07-08 DIAGNOSIS — T8459XA Infection and inflammatory reaction due to other internal joint prosthesis, initial encounter: Secondary | ICD-10-CM | POA: Diagnosis not present

## 2020-07-08 DIAGNOSIS — A4901 Methicillin susceptible Staphylococcus aureus infection, unspecified site: Secondary | ICD-10-CM

## 2020-07-08 DIAGNOSIS — M71161 Other infective bursitis, right knee: Secondary | ICD-10-CM

## 2020-07-08 DIAGNOSIS — T8453XA Infection and inflammatory reaction due to internal right knee prosthesis, initial encounter: Secondary | ICD-10-CM | POA: Diagnosis not present

## 2020-07-08 DIAGNOSIS — R58 Hemorrhage, not elsewhere classified: Secondary | ICD-10-CM | POA: Diagnosis not present

## 2020-07-08 DIAGNOSIS — Z96659 Presence of unspecified artificial knee joint: Secondary | ICD-10-CM | POA: Diagnosis not present

## 2020-07-09 ENCOUNTER — Encounter (HOSPITAL_COMMUNITY): Payer: Self-pay | Admitting: Orthopedic Surgery

## 2020-07-09 ENCOUNTER — Inpatient Hospital Stay (HOSPITAL_COMMUNITY): Payer: 59 | Admitting: Anesthesiology

## 2020-07-09 ENCOUNTER — Other Ambulatory Visit (HOSPITAL_COMMUNITY): Payer: Self-pay | Admitting: Orthopedic Surgery

## 2020-07-09 ENCOUNTER — Ambulatory Visit (HOSPITAL_COMMUNITY)
Admission: RE | Admit: 2020-07-09 | Discharge: 2020-07-09 | Disposition: A | Discharge status: Home / Self Care | Payer: 59 | Source: Ambulatory Visit | Attending: Orthopedic Surgery | Admitting: Orthopedic Surgery

## 2020-07-09 ENCOUNTER — Other Ambulatory Visit: Payer: Self-pay

## 2020-07-09 ENCOUNTER — Encounter (HOSPITAL_COMMUNITY)
Admission: RE | Disposition: A | Discharge status: Home / Self Care | Payer: Self-pay | Source: Ambulatory Visit | Attending: Orthopedic Surgery

## 2020-07-09 DIAGNOSIS — L4050 Arthropathic psoriasis, unspecified: Secondary | ICD-10-CM | POA: Insufficient documentation

## 2020-07-09 DIAGNOSIS — Y838 Other surgical procedures as the cause of abnormal reaction of the patient, or of later complication, without mention of misadventure at the time of the procedure: Secondary | ICD-10-CM | POA: Diagnosis not present

## 2020-07-09 DIAGNOSIS — Z8379 Family history of other diseases of the digestive system: Secondary | ICD-10-CM | POA: Insufficient documentation

## 2020-07-09 DIAGNOSIS — K219 Gastro-esophageal reflux disease without esophagitis: Secondary | ICD-10-CM | POA: Diagnosis not present

## 2020-07-09 DIAGNOSIS — Z809 Family history of malignant neoplasm, unspecified: Secondary | ICD-10-CM | POA: Diagnosis not present

## 2020-07-09 DIAGNOSIS — Z20822 Contact with and (suspected) exposure to covid-19: Secondary | ICD-10-CM | POA: Diagnosis not present

## 2020-07-09 DIAGNOSIS — E1140 Type 2 diabetes mellitus with diabetic neuropathy, unspecified: Secondary | ICD-10-CM | POA: Diagnosis not present

## 2020-07-09 DIAGNOSIS — Z885 Allergy status to narcotic agent status: Secondary | ICD-10-CM | POA: Insufficient documentation

## 2020-07-09 DIAGNOSIS — M25561 Pain in right knee: Secondary | ICD-10-CM | POA: Diagnosis present

## 2020-07-09 DIAGNOSIS — Z800 Family history of malignant neoplasm of digestive organs: Secondary | ICD-10-CM | POA: Diagnosis not present

## 2020-07-09 DIAGNOSIS — E669 Obesity, unspecified: Secondary | ICD-10-CM | POA: Insufficient documentation

## 2020-07-09 DIAGNOSIS — L7634 Postprocedural seroma of skin and subcutaneous tissue following other procedure: Secondary | ICD-10-CM | POA: Diagnosis not present

## 2020-07-09 DIAGNOSIS — I5022 Chronic systolic (congestive) heart failure: Secondary | ICD-10-CM | POA: Insufficient documentation

## 2020-07-09 DIAGNOSIS — I110 Hypertensive heart disease with heart failure: Secondary | ICD-10-CM | POA: Diagnosis not present

## 2020-07-09 DIAGNOSIS — Z6841 Body Mass Index (BMI) 40.0 and over, adult: Secondary | ICD-10-CM | POA: Insufficient documentation

## 2020-07-09 DIAGNOSIS — Z96653 Presence of artificial knee joint, bilateral: Secondary | ICD-10-CM | POA: Diagnosis not present

## 2020-07-09 DIAGNOSIS — M25461 Effusion, right knee: Secondary | ICD-10-CM | POA: Diagnosis present

## 2020-07-09 DIAGNOSIS — Z79899 Other long term (current) drug therapy: Secondary | ICD-10-CM | POA: Insufficient documentation

## 2020-07-09 DIAGNOSIS — I4811 Longstanding persistent atrial fibrillation: Secondary | ICD-10-CM | POA: Diagnosis not present

## 2020-07-09 DIAGNOSIS — G8918 Other acute postprocedural pain: Secondary | ICD-10-CM | POA: Diagnosis not present

## 2020-07-09 DIAGNOSIS — Z8673 Personal history of transient ischemic attack (TIA), and cerebral infarction without residual deficits: Secondary | ICD-10-CM | POA: Diagnosis not present

## 2020-07-09 DIAGNOSIS — Z8049 Family history of malignant neoplasm of other genital organs: Secondary | ICD-10-CM | POA: Diagnosis not present

## 2020-07-09 DIAGNOSIS — M96842 Postprocedural seroma of a musculoskeletal structure following a musculoskeletal system procedure: Secondary | ICD-10-CM | POA: Diagnosis not present

## 2020-07-09 DIAGNOSIS — M96840 Postprocedural hematoma of a musculoskeletal structure following a musculoskeletal system procedure: Secondary | ICD-10-CM | POA: Diagnosis not present

## 2020-07-09 HISTORY — PX: IRRIGATION AND DEBRIDEMENT KNEE: SHX5185

## 2020-07-09 LAB — BASIC METABOLIC PANEL
Anion gap: 8 (ref 5–15)
BUN: 17 mg/dL (ref 6–20)
CO2: 24 mmol/L (ref 22–32)
Calcium: 9.1 mg/dL (ref 8.9–10.3)
Chloride: 104 mmol/L (ref 98–111)
Creatinine, Ser: 1.08 mg/dL (ref 0.61–1.24)
GFR, Estimated: >60 mL/min (ref >60)
Glucose, Bld: 172 mg/dL — ABNORMAL HIGH (ref 70–99)
Potassium: 4.2 mmol/L (ref 3.5–5.1)
Sodium: 136 mmol/L (ref 135–145)

## 2020-07-09 LAB — RESPIRATORY PANEL BY RT PCR (FLU A&B, COVID)
Influenza A by PCR: NEGATIVE
Influenza B by PCR: NEGATIVE
SARS Coronavirus 2 by RT PCR: NEGATIVE

## 2020-07-09 LAB — GLUCOSE, CAPILLARY: Glucose-Capillary: 201 mg/dL — ABNORMAL HIGH (ref 70–99)

## 2020-07-09 SURGERY — IRRIGATION AND DEBRIDEMENT KNEE
Anesthesia: General | Site: Knee | Laterality: Right

## 2020-07-09 MED ORDER — 0.9 % SODIUM CHLORIDE (POUR BTL) OPTIME
TOPICAL | Status: DC | PRN
  Administered 2020-07-09: 1000 mL

## 2020-07-09 MED ORDER — PROMETHAZINE HCL 25 MG/ML IJ SOLN: 6.2500 mg | INTRAMUSCULAR | Status: DC | PRN

## 2020-07-09 MED ORDER — FENTANYL CITRATE (PF) 100 MCG/2ML IJ SOLN
INTRAMUSCULAR | Status: AC
  Filled 2020-07-09: qty 2

## 2020-07-09 MED ORDER — DEXTROSE 5 % IV SOLN
3.0000 g | INTRAVENOUS | Status: DC
  Filled 2020-07-09: qty 3000

## 2020-07-09 MED ORDER — TRANEXAMIC ACID-NACL 1000-0.7 MG/100ML-% IV SOLN
1000.0000 mg | INTRAVENOUS | Status: DC
  Filled 2020-07-09: qty 100

## 2020-07-09 MED ORDER — PHENYLEPHRINE HCL (PRESSORS) 10 MG/ML IV SOLN
INTRAVENOUS | Status: AC
  Filled 2020-07-09: qty 2

## 2020-07-09 MED ORDER — ACETAMINOPHEN 500 MG PO TABS
1000.0000 mg | ORAL_TABLET | Freq: Once | ORAL | Status: AC
  Administered 2020-07-09: 1000 mg via ORAL
  Filled 2020-07-09: qty 2

## 2020-07-09 MED ORDER — POVIDONE-IODINE 10 % EX SWAB
2.0000 "application " | Freq: Once | CUTANEOUS | Status: DC
  Administered 2020-07-09: 2 via TOPICAL

## 2020-07-09 MED ORDER — LACTATED RINGERS IV SOLN: INTRAVENOUS | Status: DC

## 2020-07-09 MED ORDER — CEFAZOLIN SODIUM-DEXTROSE 2-4 GM/100ML-% IV SOLN: 2.0000 g | Freq: Four times a day (QID) | INTRAVENOUS | Status: DC

## 2020-07-09 MED ORDER — LACTATED RINGERS IV BOLUS: 500.0000 mL | Freq: Once | INTRAVENOUS | Status: DC

## 2020-07-09 MED ORDER — PHENYLEPHRINE HCL-NACL 20-0.9 MG/250ML-% IV SOLN
INTRAVENOUS | Status: DC | PRN
  Administered 2020-07-09: 50 ug/min via INTRAVENOUS

## 2020-07-09 MED ORDER — FENTANYL CITRATE (PF) 100 MCG/2ML IJ SOLN
50.0000 ug | INTRAMUSCULAR | Status: DC
  Administered 2020-07-09: 100 ug via INTRAVENOUS
  Filled 2020-07-09: qty 2

## 2020-07-09 MED ORDER — ONDANSETRON HCL 4 MG/2ML IJ SOLN
INTRAMUSCULAR | Status: AC
  Filled 2020-07-09: qty 2

## 2020-07-09 MED ORDER — LIDOCAINE 2% (20 MG/ML) 5 ML SYRINGE
INTRAMUSCULAR | Status: AC
  Filled 2020-07-09: qty 5

## 2020-07-09 MED ORDER — SODIUM CHLORIDE 0.9 % IR SOLN
Status: DC | PRN
  Administered 2020-07-09: 3000 mL

## 2020-07-09 MED ORDER — MIDAZOLAM HCL 2 MG/2ML IJ SOLN
INTRAMUSCULAR | Status: AC
  Filled 2020-07-09: qty 2

## 2020-07-09 MED ORDER — CHLORHEXIDINE GLUCONATE 0.12 % MT SOLN
15.0000 mL | Freq: Once | OROMUCOSAL | Status: AC
  Administered 2020-07-09: 15 mL via OROMUCOSAL

## 2020-07-09 MED ORDER — METHOCARBAMOL 500 MG PO TABS: 500.0000 mg | ORAL_TABLET | Freq: Four times a day (QID) | ORAL | 0 refills | Status: DC | PRN

## 2020-07-09 MED ORDER — FENTANYL CITRATE (PF) 100 MCG/2ML IJ SOLN
25.0000 ug | INTRAMUSCULAR | Status: DC | PRN
  Administered 2020-07-09 (×3): 25 ug via INTRAVENOUS

## 2020-07-09 MED ORDER — FENTANYL CITRATE (PF) 100 MCG/2ML IJ SOLN
INTRAMUSCULAR | Status: AC
  Administered 2020-07-09: 25 ug via INTRAVENOUS
  Filled 2020-07-09: qty 2

## 2020-07-09 MED ORDER — VANCOMYCIN HCL 1000 MG IV SOLR
INTRAVENOUS | Status: AC
  Filled 2020-07-09: qty 1000

## 2020-07-09 MED ORDER — TRANEXAMIC ACID-NACL 1000-0.7 MG/100ML-% IV SOLN
1000.0000 mg | INTRAVENOUS | Status: AC
  Administered 2020-07-09: 1000 mg via INTRAVENOUS

## 2020-07-09 MED ORDER — ROPIVACAINE HCL 7.5 MG/ML IJ SOLN
INTRAMUSCULAR | Status: DC | PRN
  Administered 2020-07-09: 20 mL via PERINEURAL

## 2020-07-09 MED ORDER — PROPOFOL 10 MG/ML IV BOLUS
INTRAVENOUS | Status: AC
  Filled 2020-07-09: qty 40

## 2020-07-09 MED ORDER — ONDANSETRON HCL 4 MG/2ML IJ SOLN
INTRAMUSCULAR | Status: DC | PRN
  Administered 2020-07-09: 4 mg via INTRAVENOUS

## 2020-07-09 MED ORDER — FENTANYL CITRATE (PF) 100 MCG/2ML IJ SOLN
INTRAMUSCULAR | Status: DC | PRN
Start: 2020-07-09 — End: 2020-07-09
  Administered 2020-07-09: 50 ug via INTRAVENOUS
  Administered 2020-07-09 (×2): 25 ug via INTRAVENOUS
  Administered 2020-07-09: 75 ug via INTRAVENOUS
  Administered 2020-07-09: 25 ug via INTRAVENOUS

## 2020-07-09 MED ORDER — PHENYLEPHRINE 40 MCG/ML (10ML) SYRINGE FOR IV PUSH (FOR BLOOD PRESSURE SUPPORT)
PREFILLED_SYRINGE | INTRAVENOUS | Status: AC
  Filled 2020-07-09: qty 10

## 2020-07-09 MED ORDER — PHENYLEPHRINE 40 MCG/ML (10ML) SYRINGE FOR IV PUSH (FOR BLOOD PRESSURE SUPPORT)
PREFILLED_SYRINGE | INTRAVENOUS | Status: DC | PRN
  Administered 2020-07-09: 80 ug via INTRAVENOUS
  Administered 2020-07-09: 120 ug via INTRAVENOUS
  Administered 2020-07-09 (×2): 160 ug via INTRAVENOUS
  Administered 2020-07-09: 120 ug via INTRAVENOUS

## 2020-07-09 MED ORDER — CLONIDINE HCL (ANALGESIA) 100 MCG/ML EP SOLN
EPIDURAL | Status: DC | PRN
  Administered 2020-07-09: 100 ug

## 2020-07-09 MED ORDER — MIDAZOLAM HCL 2 MG/2ML IJ SOLN
1.0000 mg | INTRAMUSCULAR | Status: DC
  Administered 2020-07-09: 2 mg via INTRAVENOUS
  Filled 2020-07-09: qty 2

## 2020-07-09 MED ORDER — CHLORHEXIDINE GLUCONATE 4 % EX LIQD: 60.0000 mL | Freq: Once | CUTANEOUS | Status: DC

## 2020-07-09 MED ORDER — LACTATED RINGERS IV BOLUS
250.0000 mL | Freq: Once | INTRAVENOUS | Status: AC
  Administered 2020-07-09: 250 mL via INTRAVENOUS

## 2020-07-09 MED ORDER — LIDOCAINE 2% (20 MG/ML) 5 ML SYRINGE
INTRAMUSCULAR | Status: DC | PRN
  Administered 2020-07-09: 100 mg via INTRAVENOUS

## 2020-07-09 MED ORDER — OXYCODONE HCL 5 MG PO TABS: 5.0000 mg | ORAL_TABLET | ORAL | 0 refills | Status: DC | PRN

## 2020-07-09 MED ORDER — DEXAMETHASONE SODIUM PHOSPHATE 10 MG/ML IJ SOLN
INTRAMUSCULAR | Status: DC | PRN
  Administered 2020-07-09: 8 mg via INTRAVENOUS

## 2020-07-09 MED ORDER — PROPOFOL 10 MG/ML IV BOLUS
INTRAVENOUS | Status: DC | PRN
  Administered 2020-07-09: 180 mg via INTRAVENOUS

## 2020-07-09 MED ORDER — IRRISEPT - 450ML BOTTLE WITH 0.05% CHG IN STERILE WATER, USP 99.95% OPTIME
TOPICAL | Status: DC | PRN
  Administered 2020-07-09: 450 mL

## 2020-07-09 MED ORDER — DEXAMETHASONE SODIUM PHOSPHATE 10 MG/ML IJ SOLN
INTRAMUSCULAR | Status: AC
  Filled 2020-07-09: qty 1

## 2020-07-09 MED ORDER — DEXTROSE 5 % IV SOLN
3.0000 g | Freq: Once | INTRAVENOUS | Status: AC
  Administered 2020-07-09: 3 g via INTRAVENOUS
  Filled 2020-07-09: qty 3000

## 2020-07-09 MED FILL — METHOCARBAMOL 500 MG TABS: 500 | 10 days supply | Qty: 40 | Fill #0

## 2020-07-09 SURGICAL SUPPLY — 50 items
BAG ZIPLOCK 12X15 (MISCELLANEOUS) ×3 IMPLANT
BLADE SAW SGTL 11.0X1.19X90.0M (BLADE) IMPLANT
BNDG ELASTIC 6X10 VLCR STRL LF (GAUZE/BANDAGES/DRESSINGS) ×3 IMPLANT
BNDG ELASTIC 6X5.8 VLCR STR LF (GAUZE/BANDAGES/DRESSINGS) ×3 IMPLANT
COVER SURGICAL LIGHT HANDLE (MISCELLANEOUS) ×3 IMPLANT
COVER WAND RF STERILE (DRAPES) IMPLANT
CUFF TOURN SGL QUICK 34 (TOURNIQUET CUFF) ×3
CUFF TRNQT CYL 34X4.125X (TOURNIQUET CUFF) ×1 IMPLANT
DERMABOND ADVANCED (GAUZE/BANDAGES/DRESSINGS) ×2
DERMABOND ADVANCED .7 DNX12 (GAUZE/BANDAGES/DRESSINGS) ×1 IMPLANT
DRAPE SHEET LG 3/4 BI-LAMINATE (DRAPES) ×3 IMPLANT
DRSG ADAPTIC 3X8 NADH LF (GAUZE/BANDAGES/DRESSINGS) IMPLANT
DRSG AQUACEL AG ADV 3.5X10 (GAUZE/BANDAGES/DRESSINGS) ×3 IMPLANT
DRSG PAD ABDOMINAL 8X10 ST (GAUZE/BANDAGES/DRESSINGS) ×6 IMPLANT
DRSG TEGADERM 4X4.75 (GAUZE/BANDAGES/DRESSINGS) IMPLANT
DURAPREP 26ML APPLICATOR (WOUND CARE) ×6 IMPLANT
EVACUATOR 1/8 PVC DRAIN (DRAIN) ×3 IMPLANT
GAUZE SPONGE 2X2 8PLY STRL LF (GAUZE/BANDAGES/DRESSINGS) IMPLANT
GAUZE SPONGE 4X4 12PLY STRL (GAUZE/BANDAGES/DRESSINGS) ×6 IMPLANT
GAUZE XEROFORM 1X8 LF (GAUZE/BANDAGES/DRESSINGS) ×3 IMPLANT
GLOVE BIO SURGEON STRL SZ 6 (GLOVE) ×6 IMPLANT
GLOVE BIOGEL PI IND STRL 6.5 (GLOVE) ×1 IMPLANT
GLOVE BIOGEL PI IND STRL 7.5 (GLOVE) ×1 IMPLANT
GLOVE BIOGEL PI IND STRL 8.5 (GLOVE) ×1 IMPLANT
GLOVE BIOGEL PI INDICATOR 6.5 (GLOVE) ×2
GLOVE BIOGEL PI INDICATOR 7.5 (GLOVE) ×2
GLOVE BIOGEL PI INDICATOR 8.5 (GLOVE) ×2
GLOVE ECLIPSE 8.0 STRL XLNG CF (GLOVE) ×3 IMPLANT
GLOVE ORTHO TXT STRL SZ7.5 (GLOVE) ×6 IMPLANT
GOWN STRL REUS W/TWL 2XL LVL3 (GOWN DISPOSABLE) ×3 IMPLANT
GOWN STRL REUS W/TWL LRG LVL3 (GOWN DISPOSABLE) ×3 IMPLANT
HANDPIECE INTERPULSE COAX TIP (DISPOSABLE) ×3
KIT TURNOVER KIT A (KITS) IMPLANT
MANIFOLD NEPTUNE II (INSTRUMENTS) ×3 IMPLANT
PACK TOTAL KNEE CUSTOM (KITS) ×3 IMPLANT
PADDING CAST COTTON 6X4 STRL (CAST SUPPLIES) ×6 IMPLANT
PENCIL SMOKE EVACUATOR (MISCELLANEOUS) ×3 IMPLANT
PROTECTOR NERVE ULNAR (MISCELLANEOUS) ×3 IMPLANT
SET HNDPC FAN SPRY TIP SCT (DISPOSABLE) ×1 IMPLANT
SET PAD KNEE POSITIONER (MISCELLANEOUS) ×3 IMPLANT
SPONGE GAUZE 2X2 STER 10/PKG (GAUZE/BANDAGES/DRESSINGS)
STAPLER VISISTAT 35W (STAPLE) IMPLANT
SUT MNCRL AB 3-0 PS2 18 (SUTURE) ×3 IMPLANT
SUT MNCRL AB 4-0 PS2 18 (SUTURE) ×3 IMPLANT
SUT STRATAFIX PDS+ 0 24IN (SUTURE) ×3 IMPLANT
SUT VIC AB 1 CT1 36 (SUTURE) ×6 IMPLANT
SUT VIC AB 2-0 CT1 27 (SUTURE) ×9
SUT VIC AB 2-0 CT1 TAPERPNT 27 (SUTURE) ×3 IMPLANT
SWAB COLLECTION DEVICE MRSA (MISCELLANEOUS) IMPLANT
SWAB CULTURE ESWAB REG 1ML (MISCELLANEOUS) IMPLANT

## 2020-07-09 NOTE — Progress Notes (Signed)
Assisted Dr. Turk with right, ultrasound guided, adductor canal block. Side rails up, monitors on throughout procedure. See vital signs in flow sheet. Tolerated Procedure well.  

## 2020-07-09 NOTE — Op Note (Signed)
NAME: [name], [name] MEDICAL RECORD NW:[id number] ACCOUNT [id number] DATE OF BIRTH:1961-04-21 FACILITY: [facility] LOCATION: [facility] PHYSICIAN:Eriyah Fernando Marian Sorrow, MD  OPERATIVE REPORT  DATE OF PROCEDURE:  07/09/2020  PREOPERATIVE DIAGNOSIS:  Recurrent and persistent prepatellar seroma/hematoma following I and D of the prepatellar space for identified infection with history of complex revision surgeries for infection.  POSTOPERATIVE DIAGNOSIS:  Recurrent and persistent prepatellar seroma/hematoma following I and D of the prepatellar space for identified infection with history of complex revision surgeries for infection.  PROCEDURE:  Open excisional and non-excisional debridement of the right prepatellar knee space.  The excision was of his old incision as well as any nonviable tissue within the knee.  The non-excisional debridement included 3 liters of normal saline  solution with 450 mL of IrriSept/chlorhexidine-based fluid.  SURGEON:  [name], MD  ASSISTANT:  Griffith Citron, PA-C  Note that Ms. Nehemiah Settle was present for the entirety of the case from preoperative positioning, perioperative management of the operative extremity and general facilitation of case and primary wound closure.  ANESTHESIA:  Spinal plus regional.  ESTIMATED BLOOD LOSS:  Less than 100 mL.  COMPLICATIONS:  None.  DRAINS:  One medium Hemovac was placed.  TOURNIQUET:  Up for 4 minutes at 200 mmHg.  INDICATIONS OF THE PROCEDURE:  The patient is a very pleasant 59 year old male with history of diabetes as well as surgical history of total knee arthroplasty.  He had staged bilateral knee replacements.  His right knee unfortunately became infected  following his left knee procedure.  This was treated with multiple surgical procedures and eventually with reimplantation less than a year ago.  He was in his normal state of health until recently noted swelling anteriorly.  We aspirated this area and  found that there  was purulent fluid within the prepatellar space.  There was notable audible fusion within the joint itself.  He was taken to the operating room 2 weeks ago to have an I and D of the prepatellar space.  During that procedure, I removed a  pseudocapsular layer of tissue, but in doing so unroofed significant infinite amount of punctate areas of bleeding.  Cauterization was not possible.  We closed the wound, but not over Hemovac drain.  He subsequently developed a large prepatellar swelling  that caused continued tension and pressure over the anterior aspect of the knee eventually leading to breakthrough through his suture line.  We tried to aspirate this fluid from the prepatellar space  a couple of different times, but was unable to.   Ultimately, I made a decision that we needed to go back and evacuate this hematoma as we were not making progress in the traditional manner.  The risks and benefits of recurrent swelling were discussed and reviewed.  Consent was obtained for management  of this.  Postoperatively, I told him that I would be putting a drain in to try to avoid recurrent effusion or swelling within this prepatellar space and explained the course and duration of this.  DESCRIPTION OF PROCEDURE:  The patient was brought to the operative theater.  Once adequate anesthesia, preoperative antibiotics, 3 g of Ancef, administered in addition to his perioperative use of daptomycin.  The right lower extremity was prepped and  draped in sterile fashion.  A timeout was performed identifying the patient, the planned procedure and extremity.  His leg was exsanguinated, and tourniquet elevated to 200 mmHg.  His old incision was incised.  There was not a rush of blood, but rather a  large amount of hematoma.  There were no signs of purulence.  I let the tourniquet down just after 4 minutes to make certain that there was not going to be the significant bleeding from the punctate nature of the lining of the space  and there was none.   There was gentle oozing, but nothing that could easily be cauterized.  We irrigated the space once we evacuated the hematoma and debrided nonviable tissue with about 1500 mL of normal saline solution.  I then placed IrriSept/chlorhexidine fluid in the  wound for 5 minutes.  We then re-irrigated the knee.  I did place a medium Hemovac drain as discussed preoperatively.  We then reapproximated the subcutaneous layer using 2-0 Vicryl and a running Monocryl stitch.  The wound was then cleaned, dried, and  dressed sterilely using surgical glue and Aquacel dressing.  The drain site was dressed separately.  He was then brought to the recovery room with a bulky dressing placed on his knee.  Findings were reviewed with his wife.  We will have them remove the  blood from the canister of his drain twice a day and followup on Friday, so we can check to see how his wound is doing.  IN/NUANCE  D:07/09/2020 T:07/09/2020 JOB:[id number]

## 2020-07-09 NOTE — Interval H&P Note (Signed)
History and Physical Interval Note:  07/09/2020 11:26 AM  [name]  has presented today for surgery, with the diagnosis of RIGHT KNEE POST-OP SEROMA.  The various methods of treatment have been discussed with the patient and family. After consideration of risks, benefits and other options for treatment, the patient has consented to  Procedure(s): IRRIGATION AND DEBRIDEMENT KNEE (Right) as a surgical intervention.  The patient's history has been reviewed, patient examined, no change in status, stable for surgery.  I have reviewed the patient's chart and labs.  Questions were answered to the patient's satisfaction.     Mauri Pole

## 2020-07-09 NOTE — Anesthesia Preprocedure Evaluation (Addendum)
Anesthesia Evaluation  Patient identified by MRN, date of birth, ID band Patient awake    Reviewed: Allergy & Precautions, NPO status , Patient's Chart, lab work & pertinent test results, reviewed documented beta blocker date and time   Airway Mallampati: II  TM Distance: >3 FB Neck ROM: Full    Dental  (+) Teeth Intact   Pulmonary sleep apnea and Continuous Positive Airway Pressure Ventilation ,    Pulmonary exam normal breath sounds clear to auscultation       Cardiovascular hypertension, Pt. on home beta blockers and Pt. on medications +CHF  + dysrhythmias Atrial Fibrillation  Rhythm:Irregular Rate:Abnormal     Neuro/Psych TIAnegative psych ROS   GI/Hepatic Neg liver ROS, GERD  Medicated,  Endo/Other  diabetes, Type 2, Oral Hypoglycemic AgentsMorbid obesity  Renal/GU Renal InsufficiencyRenal disease     Musculoskeletal  (+) Arthritis , RIGHT KNEE POST-OP SEROMA   Abdominal   Peds  Hematology  (+) Blood dyscrasia (Xarelto), , Plt 172k   Anesthesia Other Findings Day of surgery medications reviewed with the patient.  Reproductive/Obstetrics                            Anesthesia Physical Anesthesia Plan  ASA: III  Anesthesia Plan: General   Post-op Pain Management:  Regional for Post-op pain   Induction: Intravenous  PONV Risk Score and Plan: 3 and Midazolam, Dexamethasone and Ondansetron  Airway Management Planned: LMA  Additional Equipment:   Intra-op Plan:   Post-operative Plan: Extubation in OR  Informed Consent: I have reviewed the patients History and Physical, chart, labs and discussed the procedure including the risks, benefits and alternatives for the proposed anesthesia with the patient or authorized representative who has indicated his/her understanding and acceptance.       Plan Discussed with: CRNA  Anesthesia Plan Comments:         Anesthesia Quick  Evaluation

## 2020-07-09 NOTE — Progress Notes (Signed)
RFV: follow up for MSSA prepatellar bursitis infection s/p washout c/b recurrent hematoma/seroma  Patient ID: [name]., male   DOB: 1961/05/19, 59 y.o.   MRN: [id number]  HPI [name] is a 59 yo M with hx of right TKA who recently underwent washout of prepatellar space for MSSA septic bursitis. He was placed on 4 wk of daptomycin. He is roughly 2 wks out but has had quick reaccumulation of seroma/hematoma. He was last seen by dr Alvan Dame 10/5 where they attempted to mild/drain by compression remaining hematoma with then placed occlusive dressing with compression wrap. Over the weekend, he felt that his knee still reaccumulated fluid and still had drainage of blood mostly from inferior aspect of incision. He denies, fever, or chills. He is disheartened that he is still bleeding. His course is complicated due to the fact that he is on anticoagulation for atrial fibrillation. He denies fever/chills/nightsweats. Not significant pain with walking. Has some swelling of lower extremity, on right leg that he attributes to compression wrap  No facility-administered encounter medications on file as of 07/08/2020.   Outpatient Encounter Medications as of 07/08/2020  Medication Sig  . acetaminophen (TYLENOL) 500 MG tablet Take 2 tablets (1,000 mg total) by mouth every 8 (eight) hours.  . carvedilol (COREG) 25 MG tablet TAKE 1 TABLET BY MOUTH 2 TIMES DAILY. (Patient taking differently: Take 25 mg by mouth 2 (two) times daily with a meal. )  . daptomycin (CUBICIN) IVPB Inject 760 mg into the vein daily for 27 days. Indication:  PJI/Bursitis First Dose: No Last Day of Therapy:  07/23/2020 Labs - Once weekly:  CBC/D, BMP, and CPK Labs - Every other week:  ESR and CRP Method of administration: IV Push Method of administration may be changed at the discretion of home infusion pharmacist based upon assessment of the patient and/or caregiver's ability to self-administer the medication ordered.  Marland Kitchen ENTRESTO 24-26  MG TAKE 1 TABLET BY MOUTH TWICE A DAY (Patient taking differently: Take 1 tablet by mouth 2 (two) times daily. )  . glucose blood test strip 1 each by Other route as needed. Use as instructed Freestyle test stripts  . glucose monitoring kit (FREESTYLE) monitoring kit 1 each by Does not apply route as needed.  Marland Kitchen JARDIANCE 10 MG TABS tablet TAKE 1 TABLET BY MOUTH ONCE DAILY (Patient taking differently: Take 10 mg by mouth daily. )  . Lancets (FREESTYLE) lancets 1 each by Other route as needed. Use as instructed  . methocarbamol (ROBAXIN) 500 MG tablet Take 1 tablet (500 mg total) by mouth every 6 (six) hours as needed for muscle spasms.  . Multiple Vitamin (MULTIVITAMIN WITH MINERALS) TABS tablet Take 1 tablet by mouth daily.  . mupirocin ointment (BACTROBAN) 2 % mupirocin 2 % topical ointment  APPLY TOPICALLY TO THE AFFECTED AREA ON THE SKIN TWICE DAILY.  Marland Kitchen oxyCODONE (OXY IR/ROXICODONE) 5 MG immediate release tablet Take 1-2 tablets (5-10 mg total) by mouth every 4 (four) hours as needed for moderate pain or severe pain.  Marland Kitchen oxymetazoline (AFRIN) 0.05 % nasal spray Place 1 spray into both nostrils at bedtime.   . pantoprazole (PROTONIX) 40 MG tablet Take 1 tablet (40 mg total) by mouth 2 (two) times daily. (Patient taking differently: Take 40 mg by mouth every other day. )  . Vitamin D, Cholecalciferol, 1000 units CAPS Take 1,000 Units by mouth daily.   Alveda Reasons 20 MG TABS tablet TAKE 1 TABLET BY MOUTH DAILY WITH SUPPER. (Patient taking differently:  Take 20 mg by mouth daily with supper. )  . docusate sodium (COLACE) 100 MG capsule Take 1 capsule (100 mg total) by mouth 2 (two) times daily.  . ferrous sulfate (FERROUSUL) 325 (65 FE) MG tablet Take 1 tablet (325 mg total) by mouth 3 (three) times daily with meals for 14 days.  . Ixekizumab (TALTZ) 80 MG/ML SOAJ Inject 80 mg into the skin every 28 (twenty-eight) days. Inject below the skin as directed; inject every 4 weeks. (Patient not taking: Reported  on 07/08/2020)  . polyethylene glycol (MIRALAX / GLYCOLAX) 17 g packet Take 17 g by mouth 2 (two) times daily.  . rosuvastatin (CRESTOR) 10 MG tablet TAKE 1 TABLET (10 MG TOTAL) BY MOUTH DAILY.     Patient Active Problem List   Diagnosis Date Noted  . Pain and swelling of knee, right 07/09/2020  . Infection of prosthetic right knee joint ([place]) 06/25/2020  . Chronic systolic CHF (congestive heart failure) ([place]) 09/27/2019  . High risk medication use 11/08/2018  . Infection of prosthetic knee joint ([place]) 09/10/2018  . S/P left TKA 06/28/2018  . S/P TKR (total knee replacement), right 05/31/2018  . Tubular adenoma of colon 10/18/2017  . 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 Diabetic nephropathy associated with type 2 diabetes mellitus ([place]) 10/13/2016  . Permanent atrial fibrillation ([place]) 10/13/2016  . Health examination of defined subpopulation 11/15/2015  . Polycythemia vera ([place]) 10/09/2014  . Hyperlipidemia associated with type 2 diabetes mellitus ([place]) 09/17/2014  . Hypertension associated with diabetes ([place]) 09/17/2014  . Diabetes mellitus ([place]) 10/31/2013  . Hx-TIA (transient ischemic attack) 09/05/2012  . GERD (gastroesophageal reflux disease) 08/17/2011  . Obesity (BMI 30-39.9) 08/17/2011  . Psoriatic arthritis ([place]) 08/17/2011  . Obstructive sleep apnea 08/05/2009     Health Maintenance Due  Topic Date Due  . FOOT EXAM  10/21/2019     Review of Systems 12 point ros is negative except what is mentioned above in hpi Physical Exam   BP (!) 138/91   Pulse 99   Temp 99 F (37.2 C) (Oral)   SpO2 96%   gen = a xo by 3 in nad Right knee = bandaged saturated with blood. Slow oozing of blood from most distal part of incision. 1cm wound not approximated. Bruising to knee, and lateral superior thigh. Fluid collection suspected in medial superior aspect of knee Right arm PICC line is c/d/i CBC Lab Results  Component Value Date   WBC 8.7 06/26/2020   RBC 5.28  06/26/2020   HGB 16.8 06/26/2020   HCT 48.5 06/26/2020   PLT 172 06/26/2020   MCV 91.9 06/26/2020   MCH 31.8 06/26/2020   MCHC 34.6 06/26/2020   RDW 13.2 06/26/2020   LYMPHSABS 2.2 06/04/2020   MONOABS 0.6 06/04/2020   EOSABS 0.2 06/04/2020    BMET Lab Results  Component Value Date   NA 136 07/09/2020   K 4.2 07/09/2020   CL 104 07/09/2020   CO2 24 07/09/2020   GLUCOSE 172 (H) 07/09/2020   BUN 17 07/09/2020   CREATININE 1.08 07/09/2020   CALCIUM 9.1 07/09/2020   GFRNONAA >60 07/09/2020   GFRAA 74 05/27/2020      Assessment and Plan  Spent 30 min with dressing change and inspection of knee to help with attempting to stop oozing. Applied steristrips to help approximate area  Spoke with dr Alvan Dame in terms of next steps for which plan conveyed to the patient -- to go to OR for washout  and wound vac placement  Continue with daptomycin for MSSA infection bursitis  Continue to hold anticoagulation for upcoming surgery tomorrow.

## 2020-07-09 NOTE — H&P (Signed)
KNEE ADMISSION H&P  Patient is being admitted for right knee prepatellar seroma, status post right total knee prepatellar irrigation and debridement  Subjective:  Chief Complaint: Right knee prepatellar seroma  HPI: William Barges., 59 y.o. male complaining of right knee pain and swelling. He is 11 months out from his last reimplantation/revision. His surgical history involving this right knee has been extremely complex and well documented within our system. He presented to clinic with prepatellar swelling on 06/19/20, which was aspirated and sent for culture. The culture grew staph epidermidis, and he was taken for irrigation and debridement of the prepatellar space on 06/25/20. During this surgery, there was no evidence of infection tracking into the joint. After surgery, he had persistent bloody drainage and seroma formation despite several aspirations. Dr. Alvan Dame discussed irrigation and debridement of post-operative seroma, which he did agree to proceed with. We will plan to place a hemovac during surgery.   Patient Active Problem List   Diagnosis Date Noted  . Infection of prosthetic right knee joint ([place]) 06/25/2020  . Chronic systolic CHF (congestive heart failure) ([place]) 09/27/2019  . High risk medication use 11/08/2018  . Infection of prosthetic knee joint ([place]) 09/10/2018  . S/P left TKA 06/28/2018  . S/P TKR (total knee replacement), right 05/31/2018  . Tubular adenoma of colon 10/18/2017  . 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 Diabetic nephropathy associated with type 2 diabetes mellitus ([place]) 10/13/2016  . Permanent atrial fibrillation ([place]) 10/13/2016  . Health examination of defined subpopulation 11/15/2015  . Polycythemia vera ([place]) 10/09/2014  . Hyperlipidemia associated with type 2 diabetes mellitus ([place]) 09/17/2014  . Hypertension associated with diabetes ([place]) 09/17/2014  . Diabetes mellitus ([place]) 10/31/2013  . Hx-TIA (transient ischemic attack) 09/05/2012   . GERD (gastroesophageal reflux disease) 08/17/2011  . Obesity (BMI 30-39.9) 08/17/2011  . Psoriatic arthritis ([place]) 08/17/2011  . Obstructive sleep apnea 08/05/2009   Past Medical History:  Diagnosis Date  . Arthritis   . Diabetes mellitus without complication ([place])    TYPE 2  . Diverticulosis   . Dyslipidemia   . GERD (gastroesophageal reflux disease)   . Herpes labialis   . Hypertension   . Longstanding persistent atrial fibrillation ([place])   . Metabolic syndrome   . Obesity   . Proteinuria   . Psoriasis   . Seborrheic dermatitis   . Sleep apnea    very compliant with CPAP, PT NEEDS TO BRING OWN MACHINE  . TIA (transient ischemic attack) 2009    Past Surgical History:  Procedure Laterality Date  . CARDIOVERSION N/A 12/02/2016   Procedure: CARDIOVERSION;  Surgeon: Pixie Casino, MD;  Location: [facility] ENDOSCOPY;  Service: Cardiovascular;  Laterality: N/A;  . COLONOSCOPY    . EXCISIONAL TOTAL KNEE ARTHROPLASTY WITH ANTIBIOTIC SPACERS Right 03/28/2019   Procedure: EXCISIONAL TOTAL KNEE ARTHROPLASTY WITH ANTIBIOTIC SPACERS;  Surgeon: [name], MD;  Location: [facility] ORS;  Service: Orthopedics;  Laterality: Right;  90 mins  . I & D KNEE WITH POLY EXCHANGE Right 09/10/2018   Procedure: Right Knee Arthroplasty IRRIGATION AND DEBRIDEMENT KNEE WITH POLY EXCHANGE;  Surgeon: [name], MD;  Location: [facility] ORS;  Service: Orthopedics;  Laterality: Right;  . I & D KNEE WITH POLY EXCHANGE Right 06/25/2020   Procedure: Open excisional and non excisional debridement right knee, possible aspiration versus open arthrotomy poly exchange;  Surgeon: [name], MD;  Location: [facility] ORS;  Service: Orthopedics;  Laterality: Right;  . JOINT REPLACEMENT  11/03/10   LT  HIP  . PFO occluder cardiac valve  2006   Dr. Ganji, (hole in heart)  . REIMPLANTATION OF TOTAL KNEE Right 06/15/2019   Procedure: Resection of antibiotic spacer and  irrigation and debridment and placement of new antibiotic spacer and  components;  Surgeon: Olin, Matthew, MD;  Location: [facility] ORS;  Service: Orthopedics;  Laterality: Right;  90 mins  . REIMPLANTATION OF TOTAL KNEE Right 08/22/2019   Procedure: REIMPLANTATION OF TOTAL KNEE;  Surgeon: Olin, Matthew, MD;  Location: [facility] ORS;  Service: Orthopedics;  Laterality: Right;  120 mins  . SKIN BIOPSY Left 12/22/2019   epidermal inclusion cyst  . TONSILLECTOMY AND ADENOIDECTOMY    . TOTAL HIP ARTHROPLASTY Left   . TOTAL KNEE ARTHROPLASTY Right 05/31/2018   Procedure: RIGHT TOTAL KNEE ARTHROPLASTY;  Surgeon: Olin, Matthew, MD;  Location: [facility] ORS;  Service: Orthopedics;  Laterality: Right;  70 mins  . TOTAL KNEE ARTHROPLASTY Left 06/28/2018   Procedure: LEFT TOTAL KNEE ARTHROPLASTY;  Surgeon: Olin, Matthew, MD;  Location: [facility] ORS;  Service: Orthopedics;  Laterality: Left;  70 mins  . WISDOM TOOTH EXTRACTION      No current facility-administered medications for this encounter.   Current Outpatient Medications  Medication Sig Dispense Refill Last Dose  . acetaminophen (TYLENOL) 500 MG tablet Take 2 tablets (1,000 mg total) by mouth every 8 (eight) hours. 30 tablet 0   . carvedilol (COREG) 25 MG tablet TAKE 1 TABLET BY MOUTH 2 TIMES DAILY. (Patient taking differently: Take 25 mg by mouth 2 (two) times daily with a meal. ) 180 tablet 2   . daptomycin (CUBICIN) IVPB Inject 760 mg into the vein daily for 27 days. Indication:  PJI/Bursitis First Dose: No Last Day of Therapy:  07/23/2020 Labs - Once weekly:  CBC/D, BMP, and CPK Labs - Every other week:  ESR and CRP Method of administration: IV Push Method of administration may be changed at the discretion of home infusion pharmacist based upon assessment of the patient and/or caregiver's ability to self-administer the medication ordered. 27 Units 0   . docusate sodium (COLACE) 100 MG capsule Take 1 capsule (100 mg total) by mouth 2 (two) times daily. 28 capsule 0   . ENTRESTO 24-26 MG TAKE 1 TABLET BY MOUTH TWICE A DAY (Patient taking  differently: Take 1 tablet by mouth 2 (two) times daily. ) 60 tablet 8   . ferrous sulfate (FERROUSUL) 325 (65 FE) MG tablet Take 1 tablet (325 mg total) by mouth 3 (three) times daily with meals for 14 days. 42 tablet 0   . glucose blood test strip 1 each by Other route as needed. Use as instructed Freestyle test stripts 100 each 3   . glucose monitoring kit (FREESTYLE) monitoring kit 1 each by Does not apply route as needed. 1 each 0   . Ixekizumab (TALTZ) 80 MG/ML SOAJ Inject 80 mg into the skin every 28 (twenty-eight) days. Inject below the skin as directed; inject every 4 weeks. (Patient not taking: Reported on 07/08/2020) 1 mL 11   . JARDIANCE 10 MG TABS tablet TAKE 1 TABLET BY MOUTH ONCE DAILY (Patient taking differently: Take 10 mg by mouth daily. ) 90 tablet 0   . Lancets (FREESTYLE) lancets 1 each by Other route as needed. Use as instructed 100 each 3   . methocarbamol (ROBAXIN) 500 MG tablet Take 1 tablet (500 mg total) by mouth every 6 (six) hours as needed for muscle spasms. 40 tablet 0   . Multiple Vitamin (MULTIVITAMIN   WITH MINERALS) TABS tablet Take 1 tablet by mouth daily.     . mupirocin ointment (BACTROBAN) 2 % mupirocin 2 % topical ointment  APPLY TOPICALLY TO THE AFFECTED AREA ON THE SKIN TWICE DAILY.     . oxyCODONE (OXY IR/ROXICODONE) 5 MG immediate release tablet Take 1-2 tablets (5-10 mg total) by mouth every 4 (four) hours as needed for moderate pain or severe pain. 60 tablet 0   . oxymetazoline (AFRIN) 0.05 % nasal spray Place 1 spray into both nostrils at bedtime.      . pantoprazole (PROTONIX) 40 MG tablet Take 1 tablet (40 mg total) by mouth 2 (two) times daily. (Patient taking differently: Take 40 mg by mouth every other day. ) 270 tablet 3   . polyethylene glycol (MIRALAX / GLYCOLAX) 17 g packet Take 17 g by mouth 2 (two) times daily. 28 packet 0   . rosuvastatin (CRESTOR) 10 MG tablet TAKE 1 TABLET (10 MG TOTAL) BY MOUTH DAILY. (Patient not taking: Reported on  07/08/2020) 90 tablet 2   . Vitamin D, Cholecalciferol, 1000 units CAPS Take 1,000 Units by mouth daily.      . XARELTO 20 MG TABS tablet TAKE 1 TABLET BY MOUTH DAILY WITH SUPPER. (Patient taking differently: Take 20 mg by mouth daily with supper. ) 90 tablet 1    Allergies  Allergen Reactions  . Morphine And Related Other (See Comments)    Hallucinations    Social History   Tobacco Use  . Smoking status: Never Smoker  . Smokeless tobacco: Never Used  Substance Use Topics  . Alcohol use: Yes    Comment: 2/month    Family History  Problem Relation Age of Onset  . Uterine cancer Mother 70       Cervical cath  . Crohn's disease Mother   . Esophageal cancer Father   . Cancer Other   . Obesity Other      Review of Systems  Constitutional: Negative for chills and fever.  Respiratory: Negative for cough and shortness of breath.   Cardiovascular: Negative for chest pain.  Gastrointestinal: Negative for nausea and vomiting.  Musculoskeletal: Positive for arthralgias.    Objective:  Physical Exam Right knee exam: Examination of his right knee reveals that his incision is intact. On palpation I am able to palpate a prepatellar swelling I am unable to palpate a joint effusion He has near full active tension without extensor lag And flexes with tightness today over 100. Mild lower extremity edema on the right lower extreme he as compared to his left but no erythema  Vital signs in last 24 hours: Temp:  [99 F (37.2 C)] 99 F (37.2 C) (11/08 1203) Pulse Rate:  [99] 99 (11/08 1203) BP: (138)/(91) 138/91 (11/08 1203) SpO2:  [96 %] 96 % (11/08 1203)  Labs:   Estimated body mass index is 40.23 kg/m as calculated from the following:   Height as of 06/25/20: 5' 10" (1.778 m).   Weight as of 06/25/20: 127.2 kg.   Imaging Review Plain radiographs demonstrate right total knee arthroplasty in good positioning.      Assessment/Plan:  Post-operative prepatellar seroma,  right knee  The patient history, physical examination, clinical judgment of the provider and imaging studies are consistent with Post-operative prepatellar seroma of the right knee(s) and irrigation and debridement is deemed medically necessary. The treatment options including medical management, injection therapy arthroscopy and arthroplasty were discussed at length. The risks and benefits of total knee arthroplasty were presented   and reviewed. The risks due to aseptic loosening, infection, stiffness, patella tracking problems, thromboembolic complications and other imponderables were discussed. The patient acknowledged the explanation, agreed to proceed with the plan and consent was signed. Patient is being admitted for inpatient treatment for surgery, pain control, PT, OT, prophylactic antibiotics, VTE prophylaxis, progressive ambulation and ADL's and discharge planning. The patient is planning to be discharged home.   Ashley Stinson, PA-C Orthopedic Surgery [facility] [place] [phone]    

## 2020-07-09 NOTE — Anesthesia Procedure Notes (Signed)
Anesthesia Regional Block: Adductor canal block   Pre-Anesthetic Checklist: ,, timeout performed, Correct Patient, Correct Site, Correct Laterality, Correct Procedure, Correct Position, site marked, Risks and benefits discussed,  Surgical consent,  Pre-op evaluation,  At surgeon's request and post-op pain management  Laterality: Right  Prep: chloraprep       Needles:  Injection technique: Single-shot  Needle Type: Echogenic Needle     Needle Length: 9cm  Needle Gauge: 21     Additional Needles:   Procedures:,,,, ultrasound used (permanent image in chart),,,,  Narrative:  Start time: 07/09/2020 12:05 PM End time: 07/09/2020 12:15 PM Injection made incrementally with aspirations every 5 mL.  Performed by: Personally  Anesthesiologist: Catalina Gravel, MD  Additional Notes: No pain on injection. No increased resistance to injection. Injection made in 5cc increments.  Good needle visualization.  Patient tolerated procedure well.

## 2020-07-09 NOTE — Transfer of Care (Signed)
Immediate Anesthesia Transfer of Care Note  Patient: [name].  Procedure(s) Performed: IRRIGATION AND DEBRIDEMENT KNEE (Right Knee)  Patient Location: PACU  Anesthesia Type:General  Level of Consciousness: awake, alert , oriented and patient cooperative  Airway & Oxygen Therapy: Patient Spontanous Breathing and Patient connected to face mask  Post-op Assessment: Report given to RN and Post -op Vital signs reviewed and stable  Post vital signs: Reviewed and stable  Last Vitals:  Vitals Value Taken Time  BP    Temp    Pulse 65 07/09/20 1440  Resp 15 07/09/20 1440  SpO2 100 % 07/09/20 1440  Vitals shown include unvalidated device data.  Last Pain:  Vitals:   07/09/20 1108  TempSrc:   PainSc: 7       Patients Stated Pain Goal: 4 (58/85/02 7741)  Complications: No complications documented.

## 2020-07-09 NOTE — Brief Op Note (Signed)
07/09/2020  11:30 AM  PATIENT:  [name].  59 y.o. male  PRE-OPERATIVE DIAGNOSIS:  Status post I&D of right knee prepatellar space infection with recurring seroma  POST-OPERATIVE DIAGNOSIS:  Status post I&D of right knee prepatellar space infection with recurring seroma  PROCEDURE:  Procedure(s): IRRIGATION AND DEBRIDEMENT KNEE (Right)  SURGEON:  Surgeon(s) and Role:    [name], MD - Primary  PHYSICIAN ASSISTANT: Griffith Citron, PA-C  ANESTHESIA:   regional and spinal  EBL:  <100 cc  BLOOD ADMINISTERED:none  DRAINS: (1 medium) Hemovact drain(s) in the right prepatellar space with  Suction Open   LOCAL MEDICATIONS USED:  NONE  SPECIMEN:  No Specimen  DISPOSITION OF SPECIMEN:  N/A  COUNTS:  YES  TOURNIQUET:  4 min at 200 mmHg  DICTATION: .Other Dictation: Dictation Number [id number]  PLAN OF CARE: Discharge to home after PACU  PATIENT DISPOSITION:  PACU - hemodynamically stable.   Delay start of Pharmacological VTE agent (>24hrs) due to surgical blood loss or risk of bleeding: yes

## 2020-07-09 NOTE — Anesthesia Postprocedure Evaluation (Signed)
Anesthesia Post Note  Patient: [name].  Procedure(s) Performed: IRRIGATION AND DEBRIDEMENT KNEE (Right Knee)     Patient location during evaluation: PACU Anesthesia Type: General and Regional Level of consciousness: awake and alert Pain management: pain level controlled Vital Signs Assessment: post-procedure vital signs reviewed and stable Respiratory status: spontaneous breathing, nonlabored ventilation, respiratory function stable and patient connected to nasal cannula oxygen Cardiovascular status: blood pressure returned to baseline and stable Postop Assessment: no apparent nausea or vomiting Anesthetic complications: no   No complications documented.  Last Vitals:  Vitals:   07/09/20 1530 07/09/20 1615  BP:  96/65  Pulse:  76  Resp:    Temp: 36.7 C 36.7 C  SpO2:  94%    Last Pain:  Vitals:   07/09/20 1615  TempSrc:   PainSc: 4                  Joseangel Nettleton P Marcelis Wissner

## 2020-07-09 NOTE — Evaluation (Signed)
Physical Therapy Evaluation Patient Details Name: [name]. MRN: [id number] DOB: 04-21-1961 Today's Date: 07/09/2020   History of Present Illness  Patient is 59 y.o. male s/p I&D of Rt knee prepatellar spce for infection. PMH significant for TIA, obesity, DM, GERD, HTN, OA, A-fib, Bil TKA with Rt TKR (resection with antibiotic spacer on7/28/20 with and replacement spacer on 06/15/19 and reimplantation on 08/12/19), additional 2 surgeries for I&D to Rt knee with most recent on 06/25/20.     Clinical Impression  [name]. is a 59 y.o. male POD 0 s/p Rt knee I&D. Patient reports independence with mobility at baseline. Patient is now limited by functional impairments (see PT problem list below) and requires supervision for transfers and gait. Patient was able to ambulate 230 feet with no overt LOB and no AD. Patient demonstrated safe stair mobility and verbalized safe guarding position for people assisting with mobility. Patient does not require skilled acute PT services and is safe to mobilize with RN staff if remains in hospital. Patient has met mobility goals at adequate level for discharge home.     Follow Up Recommendations Follow surgeon's recommendation for DC plan and follow-up therapies    Equipment Recommendations  None recommended by PT    Recommendations for Other Services       Precautions / Restrictions Precautions Precautions: Fall Restrictions Weight Bearing Restrictions: No      Mobility  Bed Mobility Overal bed mobility: Needs Assistance Bed Mobility: Supine to Sit     Supine to sit: Supervision     General bed mobility comments: no assist, HOB slightly elevated    Transfers Overall transfer level: Needs assistance Equipment used: None Transfers: Sit to/from Stand Sit to Stand: Supervision         General transfer comment: no assist or cue required, pt steady standing  Ambulation/Gait Ambulation/Gait assistance: Supervision Gait  Distance (Feet): 230 Feet Assistive device: None Gait Pattern/deviations: Step-through pattern;WFL(Within Functional Limits) Gait velocity: fair   General Gait Details: no cues needed, pt safe with no overt LOB noted  Stairs Stairs: Yes Stairs assistance: Supervision Stair Management: Two rails;Step to pattern;Backwards;Forwards Number of Stairs: 2 General stair comments: pt ascends with forward pattern and descend backwards with safe sequencing and use of bil hand rails.  Wheelchair Mobility    Modified Rankin (Stroke Patients Only)       Balance Overall balance assessment: Mild deficits observed, not formally tested   Sitting balance-Leahy Scale: Normal       Standing balance-Leahy Scale: Good                               Pertinent Vitals/Pain Pain Assessment: No/denies pain    Home Living Family/patient expects to be discharged to:: Private residence Living Arrangements: Spouse/significant other Available Help at Discharge: Family Type of Home: House Home Access: Stairs to enter Entrance Stairs-Rails: Psychiatric nurse of Steps: 4 Home Layout: Two level;Able to live on main level with bedroom/bathroom Home Equipment: Gilford Rile - 2 wheels;Bedside commode;Cane - single point;Grab bars - tub/shower      Prior Function Level of Independence: Independent               Hand Dominance   Dominant Hand: Right    Extremity/Trunk Assessment   Upper Extremity Assessment Upper Extremity Assessment: Overall WFL for tasks assessed    Lower Extremity Assessment Lower Extremity Assessment: Overall WFL for tasks assessed  Cervical / Trunk Assessment Cervical / Trunk Assessment: Normal  Communication   Communication: No difficulties  Cognition Arousal/Alertness: Awake/alert Behavior During Therapy: WFL for tasks assessed/performed Overall Cognitive Status: Within Functional Limits for tasks assessed                                         General Comments      Exercises     Assessment/Plan    PT Assessment All further PT needs can be met in the next venue of care  PT Problem List Decreased strength;Decreased activity tolerance;Decreased range of motion;Decreased mobility;Obesity       PT Treatment Interventions DME instruction;Stair training;Gait training;Functional mobility training;Therapeutic activities;Therapeutic exercise;Balance training;Patient/family education    PT Goals (Current goals can be found in the Care Plan section)  Acute Rehab PT Goals Patient Stated Goal: knee to heal and regain independence PT Goal Formulation: All assessment and education complete, DC therapy Time For Goal Achievement: 07/16/20 Potential to Achieve Goals: Good    Frequency Other (Comment) (1 time eval/treat)   Barriers to discharge        Co-evaluation               AM-PAC PT "6 Clicks" Mobility  Outcome Measure Help needed turning from your back to your side while in a flat bed without using bedrails?: None Help needed moving from lying on your back to sitting on the side of a flat bed without using bedrails?: None Help needed moving to and from a bed to a chair (including a wheelchair)?: None Help needed standing up from a chair using your arms (e.g., wheelchair or bedside chair)?: None Help needed to walk in hospital room?: None Help needed climbing 3-5 steps with a railing? : None 6 Click Score: 24    End of Session Equipment Utilized During Treatment: Gait belt Activity Tolerance: Patient tolerated treatment well Patient left: in bed;with call bell/phone within reach Nurse Communication: Mobility status PT Visit Diagnosis: Other abnormalities of gait and mobility (R26.89);Muscle weakness (generalized) (M62.81)    Time: 7209-4709 PT Time Calculation (min) (ACUTE ONLY): 20 min   Charges:   PT Evaluation $PT Eval Low Complexity: 1 Low         Verner Mould, DPT Acute  Rehabilitation Services  Office [phone] Pager [phone]  07/09/2020 5:12 PM

## 2020-07-09 NOTE — Anesthesia Procedure Notes (Signed)
Procedure Name: LMA Insertion Date/Time: 07/09/2020 1:09 PM Performed by: Victoriano Lain, CRNA Pre-anesthesia Checklist: Patient identified, Emergency Drugs available, Suction available, Patient being monitored and Timeout performed Patient Re-evaluated:Patient Re-evaluated prior to induction Oxygen Delivery Method: Circle system utilized Preoxygenation: Pre-oxygenation with 100% oxygen Induction Type: IV induction LMA: LMA with gastric port inserted LMA Size: 4.0 Number of attempts: 1 Placement Confirmation: positive ETCO2 and breath sounds checked- equal and bilateral Tube secured with: Tape Dental Injury: Teeth and Oropharynx as per pre-operative assessment

## 2020-07-10 ENCOUNTER — Encounter (HOSPITAL_COMMUNITY): Payer: Self-pay | Admitting: Orthopedic Surgery

## 2020-07-10 MED FILL — TALTZ 80 MG/ML AUTOINJECTOR: 80 | 28 days supply | Qty: 1 | Fill #9

## 2020-07-10 NOTE — Discharge Summary (Signed)
Patient ID: [name]. MRN: [id number] DOB/AGE: Jul 29, 1961 58 y.o.  Admit date: 07/09/2020 Discharge date: 07/09/2020  Admission Diagnoses:  Active Problems:   Pain and swelling of knee, right   Discharge Diagnoses:  Same  Past Medical History:  Diagnosis Date  . Arthritis   . Diabetes mellitus without complication ([place])    TYPE 2  . Diverticulosis   . Dyslipidemia   . GERD (gastroesophageal reflux disease)   . Herpes labialis   . Hypertension   . Longstanding persistent atrial fibrillation ([place])   . Metabolic syndrome   . Obesity   . Proteinuria   . Psoriasis   . Seborrheic dermatitis   . Sleep apnea    very compliant with CPAP, PT NEEDS TO BRING OWN MACHINE  . TIA (transient ischemic attack) 2009    Surgeries: Procedure(s):  IRRIGATION AND DEBRIDEMENT RIGHT KNEE on 07/09/2020   Consultants: N/A  Discharged Condition: Improved  Hospital Course: [name]. is an 58 y.o. male who was admitted 07/09/2020 for operative treatment of right knee hematoma. Patient has severe unremitting pain that affects sleep, daily activities, and work/hobbies. After pre-op clearance the patient was taken to the operating room on 07/09/2020 and underwent  Procedure(s):  IRRIGATION AND DEBRIDEMENT RIGHT KNEE.    Patient was given perioperative antibiotics:  Anti-infectives (From admission, onward)   Start     Dose/Rate Route Frequency Ordered Stop   07/09/20 1230  ceFAZolin (ANCEF) 3 g in dextrose 5 % 50 mL IVPB        3 g 100 mL/hr over 30 Minutes Intravenous  Once 07/09/20 1215 07/09/20 1340   07/09/20 1215  ceFAZolin (ANCEF) IVPB 2g/100 mL premix  Status:  Discontinued        2 g 200 mL/hr over 30 Minutes Intravenous Every 6 hours 07/09/20 1205 07/09/20 2240   07/09/20 1030  ceFAZolin (ANCEF) 3 g in dextrose 5 % 50 mL IVPB  Status:  Discontinued        3 g 100 mL/hr over 30 Minutes Intravenous On call to O.R. 07/09/20 1024 07/09/20 1140       Patient was given  sequential compression devices, early ambulation, and chemoprophylaxis to prevent DVT.  Patient benefited maximally from hospital stay and there were no complications.    Recent vital signs:  Patient Vitals for the past 24 hrs:  BP Temp Temp src Pulse Resp SpO2 Height Weight  07/09/20 1615 96/65 98 F (36.7 C) -- 76 -- 94 % -- --  07/09/20 1530 -- 98 F (36.7 C) -- -- -- -- -- --  07/09/20 1510 -- -- -- (!) 54 15 97 % -- --  07/09/20 1505 -- -- -- (!) 28 17 93 % -- --  07/09/20 1500 105/74 -- -- 72 17 98 % -- --  07/09/20 1455 -- -- -- 78 14 100 % -- --  07/09/20 1438 119/78 97.7 F (36.5 C) -- 81 14 99 % -- --  07/09/20 1233 -- -- -- 67 16 99 % -- --  07/09/20 1228 105/65 -- -- 73 17 99 % -- --  07/09/20 1223 115/86 -- -- (!) 45 18 97 % -- --  07/09/20 1220 115/86 -- -- 95 16 97 % -- --  07/09/20 1219 -- -- -- 99 (!) 23 100 % -- --  07/09/20 1218 -- -- -- 100 20 98 % -- --  07/09/20 1216 -- -- -- 89 18 93 % -- --  07/09/20 1215  111/77 -- -- 86 20 96 % -- --  07/09/20 1214 -- -- -- 61 16 98 % -- --  07/09/20 1213 -- -- -- 80 19 98 % -- --  07/09/20 1212 -- -- -- 93 16 98 % -- --  07/09/20 1211 -- -- -- 83 15 98 % -- --  07/09/20 1210 -- -- -- (P) 92 19 98 % -- --  07/09/20 1209 -- -- -- 65 19 98 % -- --  07/09/20 1208 -- -- -- (P) 81 17 97 % -- --  07/09/20 1207 -- -- -- 92 19 95 % -- --  07/09/20 1206 104/80 -- -- 87 (!) 22 97 % -- --  07/09/20 1108 -- -- -- -- -- -- 5' 10"  (1.778 m) 127.4 kg  07/09/20 1043 118/88 98.5 F (36.9 C) Oral 84 12 98 % 5' 10"  (1.778 m) 127.4 kg     Recent laboratory studies:  Recent Labs    07/09/20 1045  NA 136  K 4.2  CL 104  CO2 24  BUN 17  CREATININE 1.08  GLUCOSE 172*  CALCIUM 9.1     Discharge Medications:   Allergies as of 07/09/2020      Reactions   Morphine And Related Other (See Comments)   Hallucinations      Medication List    STOP taking these medications   docusate sodium 100 MG capsule Commonly known as:  Colace   ferrous sulfate 325 (65 FE) MG tablet Commonly known as: FerrouSul   polyethylene glycol 17 g packet Commonly known as: MIRALAX / GLYCOLAX   Taltz 80 MG/ML Soaj Generic drug: Ixekizumab     TAKE these medications   acetaminophen 500 MG tablet Commonly known as: TYLENOL Take 2 tablets (1,000 mg total) by mouth every 8 (eight) hours.   carvedilol 25 MG tablet Commonly known as: COREG TAKE 1 TABLET BY MOUTH 2 TIMES DAILY. What changed: when to take this   daptomycin  IVPB Commonly known as: CUBICIN Inject 760 mg into the vein daily for 27 days. Indication:  PJI/Bursitis First Dose: No Last Day of Therapy:  07/23/2020 Labs - Once weekly:  CBC/D, BMP, and CPK Labs - Every other week:  ESR and CRP Method of administration: IV Push Method of administration may be changed at the discretion of home infusion pharmacist based upon assessment of the patient and/or caregiver's ability to self-administer the medication ordered.   Entresto 24-26 MG Generic drug: sacubitril-valsartan TAKE 1 TABLET BY MOUTH TWICE A DAY   freestyle lancets 1 each by Other route as needed. Use as instructed   glucose blood test strip 1 each by Other route as needed. Use as instructed Freestyle test stripts   glucose monitoring kit monitoring kit 1 each by Does not apply route as needed.   Jardiance 10 MG Tabs tablet Generic drug: empagliflozin TAKE 1 TABLET BY MOUTH ONCE DAILY What changed: how much to take   methocarbamol 500 MG tablet Commonly known as: Robaxin Take 1 tablet (500 mg total) by mouth every 6 (six) hours as needed for muscle spasms.   multivitamin with minerals Tabs tablet Take 1 tablet by mouth daily.   mupirocin ointment 2 % Commonly known as: BACTROBAN mupirocin 2 % topical ointment  APPLY TOPICALLY TO THE AFFECTED AREA ON THE SKIN TWICE DAILY.   oxyCODONE 5 MG immediate release tablet Commonly known as: Oxy IR/ROXICODONE Take 1-2 tablets (5-10 mg total) by  mouth every 4 (four) hours as needed  for moderate pain or severe pain.   oxymetazoline 0.05 % nasal spray Commonly known as: AFRIN Place 1 spray into both nostrils at bedtime.   pantoprazole 40 MG tablet Commonly known as: PROTONIX Take 1 tablet (40 mg total) by mouth 2 (two) times daily. What changed: when to take this   pregabalin 100 MG capsule Commonly known as: LYRICA Take 100 mg by mouth 2 (two) times daily. Unsure of mg taking   rosuvastatin 10 MG tablet Commonly known as: CRESTOR TAKE 1 TABLET (10 MG TOTAL) BY MOUTH DAILY.   Vitamin D (Cholecalciferol) 25 MCG (1000 UT) Caps Take 1,000 Units by mouth daily.   Xarelto 20 MG Tabs tablet Generic drug: rivaroxaban TAKE 1 TABLET BY MOUTH DAILY WITH SUPPER. What changed: See the new instructions.       Diagnostic Studies: DG CHEST PORT 1 VIEW  Result Date: 06/26/2020 CLINICAL DATA:  Status post PICC placement. EXAM: PORTABLE CHEST 1 VIEW COMPARISON:  June 15, 2019. FINDINGS: Stable cardiomegaly. No pneumothorax or pleural effusion is noted. Right-sided PICC line is noted with distal tip in expected position of the SVC. Both lungs are clear. The visualized skeletal structures are unremarkable. IMPRESSION: Right-sided PICC line in good position. No acute cardiopulmonary abnormality seen. Electronically Signed   By: Marijo Conception M.D.   On: 06/26/2020 13:56   [place] EKG SITE RITE  Result Date: 06/25/2020 If Site Rite image not attached, placement could not be confirmed due to current cardiac rhythm.   Disposition: Home  Discharge Instructions    Call MD / Call 911   Complete by: As directed    If you experience chest pain or shortness of breath, CALL 911 and be transported to the hospital emergency room.  If you develope a fever above 101 F, pus (white drainage) or increased drainage or redness at the wound, or calf pain, call your surgeon's office.   Constipation Prevention   Complete by: As directed    Drink plenty  of fluids.  Prune juice may be helpful.  You may use a stool softener, such as Colace (over the counter) 100 mg twice a day.  Use MiraLax (over the counter) for constipation as needed.   Diet - low sodium heart healthy   Complete by: As directed    Discharge instructions   Complete by: As directed    Maintain surgical dressing until follow up in the clinic. If the edges start to pull up, may reinforce with tape. If the dressing is no longer working, may remove and cover with gauze and tape, but must keep the area dry and clean.  Follow up in 2 weeks at [facility]. Call with any questions or concerns.  Bulb to be emptied when needed and recharged.   Increase activity slowly as tolerated   Complete by: As directed    Weight bearing as tolerated with assist device (walker, cane, etc) as directed, use it as long as suggested by your surgeon or therapist, typically at least 4-6 weeks.       Follow-up Information    [name], MD. Schedule an appointment as soon as possible for a visit in 2 weeks.   Specialty: Orthopedic Surgery Contact information: [address] [place] [address] [phone]                Signed: Lucille Passy [facility] 07/10/2020, 9:34 AM

## 2020-07-11 ENCOUNTER — Ambulatory Visit: Payer: 59 | Admitting: Internal Medicine

## 2020-07-12 ENCOUNTER — Other Ambulatory Visit: Payer: Self-pay | Admitting: *Deleted

## 2020-07-12 DIAGNOSIS — H524 Presbyopia: Secondary | ICD-10-CM | POA: Diagnosis not present

## 2020-07-12 LAB — HM DIABETES EYE EXAM

## 2020-07-12 NOTE — Patient Outreach (Signed)
[facility]) Care Management  07/12/2020  [name]. 1961-06-23 [id number]   Transition of care telephone call  Referral received:07/12/20 Initial outreach:07/12/20 Insurance: UMR   Initial unsuccessful telephone call to patient's preferred number in order to complete transition of care assessment; no answer, left HIPAA compliant voicemail message requesting return call.   Objective: Per the electronic medical record, [name]  was hospitalized at [facility] on 07/09/20 for Irrigation and Debridment Right knee. PMH significant for TIA, obesity , HTN, OSA -CPAP, Atrial fib, Bilateral TKA, Right  TKR ( resection with antibiotic spacer on 03/28/19 and with replacement spacer on 06/15/19 and reimplantation on 08/22/19. He was discharged to home 07/09/20 with noted  prior home services with IV antibiotics and no DME equipment per the discharge summary.   Plan: This RNCM will route unsuccessful outreach letter with [place] [facility] pamphlet and 24 hour Nurse Advice Line Magnet to [place] [facility] clinical pool to be mailed to patient's home address. This RNCM will attempt another outreach within 4 business days.  [name], RN, BSN  [place] Management Coordinator  [phone]- Mobile [phone]- Toll Free Main Office

## 2020-07-13 DIAGNOSIS — T8453XA Infection and inflammatory reaction due to internal right knee prosthesis, initial encounter: Secondary | ICD-10-CM | POA: Diagnosis not present

## 2020-07-13 DIAGNOSIS — A4901 Methicillin susceptible Staphylococcus aureus infection, unspecified site: Secondary | ICD-10-CM | POA: Diagnosis not present

## 2020-07-13 DIAGNOSIS — T8459XA Infection and inflammatory reaction due to other internal joint prosthesis, initial encounter: Secondary | ICD-10-CM | POA: Diagnosis not present

## 2020-07-15 DIAGNOSIS — Z96659 Presence of unspecified artificial knee joint: Secondary | ICD-10-CM | POA: Diagnosis not present

## 2020-07-15 DIAGNOSIS — A4901 Methicillin susceptible Staphylococcus aureus infection, unspecified site: Secondary | ICD-10-CM | POA: Diagnosis not present

## 2020-07-15 DIAGNOSIS — T8453XA Infection and inflammatory reaction due to internal right knee prosthesis, initial encounter: Secondary | ICD-10-CM | POA: Diagnosis not present

## 2020-07-16 ENCOUNTER — Encounter: Payer: Self-pay | Admitting: *Deleted

## 2020-07-16 ENCOUNTER — Other Ambulatory Visit: Payer: Self-pay | Admitting: *Deleted

## 2020-07-16 NOTE — Patient Outreach (Signed)
  [name]) Care Delgado  07/12/2020  [name]. 1960-10-25 [id number]   Transition of care telephone call  Referral received:07/12/20 Initial outreach:07/12/20 Insurance: UMR    Subjective: Initial successful telephone call to patient's preferred number in order to complete transition of care assessment; 2 HIPAA identifiers verified. Explained purpose of call and completed transition of care assessment.  [name] states that he is doing much better, he denies post-operative problems, says surgical incisions are unremarkable,reports having a Aquacel dressing in place, states surgical pain well managed with prescribed medications. He reports continuing with IV antibiotic therapy as previously prescribed and end date has been extended.He discussed tolerating mobility in the home.   Spouse is  assisting with his recovery. Patient denies further question concerns at this time.   Reviewed accessing the following [name] Benefits :  [name] states that he is enrolled in  [name] chronic disease Delgado programs for Delgado of chronic conditions.   He  uses a [name] outpatient pharmacy at [name].     Objective:   [name]  was hospitalized at [name] on 07/09/20 for Irrigation and Debridment Right knee. PMH significant for TIA, obesity , HTN, OSA -CPAP, Atrial fib, Bilateral TKA, Right  TKR ( resection with antibiotic spacer on 03/28/19 and with replacement spacer on 06/15/19 and reimplantation on 08/22/19. He was discharged to home 07/09/20 with noted  prior home services with IV antibiotics and no DME equipment per the discharge summary.   Assessment:  Patient voices good understanding of all discharge instructions.  See transition of care flowsheet for assessment details.   Plan:  Reviewed Delgado discharge diagnosis of I&D of right knee    and discharge treatment plan using Delgado discharge  instructions, assessing medication adherence, reviewing problems requiring provider notification, and discussing the importance of follow up with surgeon, primary care provider and/or specialists as directed.  Reviewed [name] healthy lifestyle program information to receive discounted premium for  2023  Step 1: Get  your annual physical  Step 2: Complete your health assessment  Step 3:Identify your current health status and complete the corresponding action step between January 1, and May 01, 2021.    Using [name] website, verified that patient is an active participate in [name]'s Active Health Delgado chronic disease Delgado program.    No ongoing care Delgado needs identified so will close case to [name] services and route successful outreach letter with [name] pamphlet and 24 Hour Nurse Line Magnet to [name] clinical pool to be mailed to patient's home address.    [name], RN, BSN  [place] [facility]gado Coordinator  [phone]- Mobile [phone]- Toll Free Main Office

## 2020-07-17 ENCOUNTER — Ambulatory Visit: Payer: 59 | Admitting: Internal Medicine

## 2020-07-17 ENCOUNTER — Other Ambulatory Visit: Payer: Self-pay

## 2020-07-17 ENCOUNTER — Telehealth: Payer: Self-pay

## 2020-07-17 ENCOUNTER — Encounter: Payer: Self-pay | Admitting: Internal Medicine

## 2020-07-17 VITALS — BP 125/85 | HR 91 | Temp 97.4°F | Wt 282.0 lb

## 2020-07-17 DIAGNOSIS — M71161 Other infective bursitis, right knee: Secondary | ICD-10-CM | POA: Diagnosis not present

## 2020-07-17 DIAGNOSIS — A4901 Methicillin susceptible Staphylococcus aureus infection, unspecified site: Secondary | ICD-10-CM

## 2020-07-17 NOTE — Progress Notes (Signed)
Patient ID: William Barges., male   DOB: Nov 03, 1960, 59 y.o.   MRN: [id number]  HPI [name] is a 59yo M with underwent evacuation of hematoma to right knee septic bursitis/area of previous pji. Joint space thought to be spared. On 3rd week of IV abtx daptomycin for which he is tolerating.  Outpatient Encounter Medications as of 07/17/2020  Medication Sig  . acetaminophen (TYLENOL) 500 MG tablet Take 2 tablets (1,000 mg total) by mouth every 8 (eight) hours.  . carvedilol (COREG) 25 MG tablet TAKE 1 TABLET BY MOUTH 2 TIMES DAILY. (Patient taking differently: Take 25 mg by mouth 2 (two) times daily with a meal. )  . daptomycin (CUBICIN) IVPB Inject 760 mg into the vein daily for 27 days. Indication:  PJI/Bursitis First Dose: No Last Day of Therapy:  07/23/2020 Labs - Once weekly:  CBC/D, BMP, and CPK Labs - Every other week:  ESR and CRP Method of administration: IV Push Method of administration may be changed at the discretion of home infusion pharmacist based upon assessment of the patient and/or caregiver's ability to self-administer the medication ordered.  [name] Kitchen ENTRESTO 24-26 MG TAKE 1 TABLET BY MOUTH TWICE A DAY (Patient taking differently: Take 1 tablet by mouth 2 (two) times daily. )  . glucose blood test strip 1 each by Other route as needed. Use as instructed Freestyle test stripts  . glucose monitoring kit (FREESTYLE) monitoring kit 1 each by Does not apply route as needed.  [name] Kitchen JARDIANCE 10 MG TABS tablet TAKE 1 TABLET BY MOUTH ONCE DAILY (Patient taking differently: Take 10 mg by mouth daily. )  . Lancets (FREESTYLE) lancets 1 each by Other route as needed. Use as instructed  . Multiple Vitamin (MULTIVITAMIN WITH MINERALS) TABS tablet Take 1 tablet by mouth daily.  . mupirocin ointment (BACTROBAN) 2 % mupirocin 2 % topical ointment  APPLY TOPICALLY TO THE AFFECTED AREA ON THE SKIN TWICE DAILY.  [name] Kitchen oxymetazoline (AFRIN) 0.05 % nasal spray Place 1 spray into both nostrils at bedtime.    . pantoprazole (PROTONIX) 40 MG tablet Take 1 tablet (40 mg total) by mouth 2 (two) times daily. (Patient taking differently: Take 40 mg by mouth every other day. )  . Vitamin D, Cholecalciferol, 1000 units CAPS Take 1,000 Units by mouth daily.   Alveda Reasons 20 MG TABS tablet TAKE 1 TABLET BY MOUTH DAILY WITH SUPPER. (Patient taking differently: Take 20 mg by mouth daily with supper. )  . methocarbamol (ROBAXIN) 500 MG tablet Take 1 tablet (500 mg total) by mouth every 6 (six) hours as needed for muscle spasms. (Patient not taking: Reported on 07/17/2020)  . oxyCODONE (OXY IR/ROXICODONE) 5 MG immediate release tablet Take 1-2 tablets (5-10 mg total) by mouth every 4 (four) hours as needed for moderate pain or severe pain. (Patient not taking: Reported on 07/17/2020)  . pregabalin (LYRICA) 100 MG capsule Take 100 mg by mouth 2 (two) times daily. Unsure of mg taking (Patient not taking: Reported on 07/17/2020)  . rosuvastatin (CRESTOR) 10 MG tablet TAKE 1 TABLET (10 MG TOTAL) BY MOUTH DAILY. (Patient not taking: Reported on 07/17/2020)   No facility-administered encounter medications on file as of 07/17/2020.     Patient Active Problem List   Diagnosis Date Noted  . Pain and swelling of knee, right 07/09/2020  . Infection of prosthetic right knee joint ([place]) 06/25/2020  . Chronic systolic CHF (congestive heart failure) ([place]) 09/27/2019  . High risk medication use 11/08/2018  .  Infection of prosthetic knee joint ([place]) 09/10/2018  . S/P left TKA 06/28/2018  . S/P TKR (total knee replacement), right 05/31/2018  . Tubular adenoma of colon 10/18/2017  . 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 Diabetic nephropathy associated with type 2 diabetes mellitus ([place]) 10/13/2016  . Permanent atrial fibrillation ([place]) 10/13/2016  . Health examination of defined subpopulation 11/15/2015  . Polycythemia vera ([place]) 10/09/2014  . Hyperlipidemia associated with type 2 diabetes mellitus ([place]) 09/17/2014   . Hypertension associated with diabetes ([place]) 09/17/2014  . Diabetes mellitus ([place]) 10/31/2013  . Hx-TIA (transient ischemic attack) 09/05/2012  . GERD (gastroesophageal reflux disease) 08/17/2011  . Obesity (BMI 30-39.9) 08/17/2011  . Psoriatic arthritis ([place]) 08/17/2011  . Obstructive sleep apnea 08/05/2009     Health Maintenance Due  Topic Date Due  . FOOT EXAM  10/21/2019     Review of Systems Review of Systems  Constitutional: Negative for fever, chills, diaphoresis, activity change, appetite change, fatigue and unexpected weight change.  HENT: Negative for congestion, sore throat, rhinorrhea, sneezing, trouble swallowing and sinus pressure.  Eyes: Negative for photophobia and visual disturbance.  Respiratory: Negative for cough, chest tightness, shortness of breath, wheezing and stridor.  Cardiovascular: Negative for chest pain, palpitations and leg swelling.  Gastrointestinal: Negative for nausea, vomiting, abdominal pain, diarrhea, constipation, blood in stool, abdominal distention and anal bleeding.  Genitourinary: Negative for dysuria, hematuria, flank pain and difficulty urinating.  Musculoskeletal: Negative for myalgias, back pain, joint swelling, arthralgias and gait problem.  Skin: Negative for color change, pallor, rash and wound.  Neurological: Negative for dizziness, tremors, weakness and light-headedness.  Hematological: Negative for adenopathy. Does not bruise/bleed easily.  Psychiatric/Behavioral: Negative for behavioral problems, confusion, sleep disturbance, dysphoric mood, decreased concentration and agitation.    Physical Exam   BP 125/85   Pulse 91   Temp (!) 97.4 F (36.3 C) (Oral)   Wt 282 lb (127.9 kg)   BMI 40.46 kg/m   gen = a x o by3 in NAD Knee = swollen, but improved Ext= picc line is c/d/i  CBC Lab Results  Component Value Date   WBC 8.7 06/26/2020   RBC 5.28 06/26/2020   HGB 16.8 06/26/2020   HCT 48.5 06/26/2020   PLT 172  06/26/2020   MCV 91.9 06/26/2020   MCH 31.8 06/26/2020   MCHC 34.6 06/26/2020   RDW 13.2 06/26/2020   LYMPHSABS 2.2 06/04/2020   MONOABS 0.6 06/04/2020   EOSABS 0.2 06/04/2020    BMET Lab Results  Component Value Date   NA 136 07/09/2020   K 4.2 07/09/2020   CL 104 07/09/2020   CO2 24 07/09/2020   GLUCOSE 172 (H) 07/09/2020   BUN 17 07/09/2020   CREATININE 1.08 07/09/2020   CALCIUM 9.1 07/09/2020   GFRNONAA >60 07/09/2020   GFRAA 74 05/27/2020      Assessment and Plan   Initially planned to do 4 wk of iv abtx, spoke with [name] last week, post surgery and felt prudent to extend for a total of 6 wk- through dec 7th. Now is the new end date  Slight oozing of serous liquid on inferior aspect of incision. No signs of infection or reaccumulation of fluid/blood in superior pockets/bursa.   Appears improved since last visit. Last surgery has helped. He is reticent for any further surgical intervention. He reports his interaction with treatment team, [name], was condescending to patient  rtc in 2 wk with myself or William [name]

## 2020-07-17 NOTE — Telephone Encounter (Signed)
Spoke with [name] with [facility] and verbal orders were given to extend patient's IV antibiotics until 08/06/20 per Dr. Baxter Flattery. [name] verbalized understanding and read instructions back to me.  [name] T Brooks Sailors

## 2020-07-20 DIAGNOSIS — A4901 Methicillin susceptible Staphylococcus aureus infection, unspecified site: Secondary | ICD-10-CM | POA: Diagnosis not present

## 2020-07-20 DIAGNOSIS — T8453XA Infection and inflammatory reaction due to internal right knee prosthesis, initial encounter: Secondary | ICD-10-CM | POA: Diagnosis not present

## 2020-07-20 DIAGNOSIS — T8459XA Infection and inflammatory reaction due to other internal joint prosthesis, initial encounter: Secondary | ICD-10-CM | POA: Diagnosis not present

## 2020-07-22 ENCOUNTER — Other Ambulatory Visit: Payer: Self-pay | Admitting: Family Medicine

## 2020-07-22 DIAGNOSIS — T8453XA Infection and inflammatory reaction due to internal right knee prosthesis, initial encounter: Secondary | ICD-10-CM | POA: Diagnosis not present

## 2020-07-22 DIAGNOSIS — E1121 Type 2 diabetes mellitus with diabetic nephropathy: Secondary | ICD-10-CM

## 2020-07-22 DIAGNOSIS — A4901 Methicillin susceptible Staphylococcus aureus infection, unspecified site: Secondary | ICD-10-CM | POA: Diagnosis not present

## 2020-07-22 MED FILL — JARDIANCE 10 MG TABLET: 10 | 90 days supply | Qty: 90 | Fill #0

## 2020-07-22 MED FILL — ENTRESTO 24 MG-26 MG TABLET: 24-26 | 30 days supply | Qty: 60 | Fill #8

## 2020-07-27 DIAGNOSIS — T8453XA Infection and inflammatory reaction due to internal right knee prosthesis, initial encounter: Secondary | ICD-10-CM | POA: Diagnosis not present

## 2020-07-27 DIAGNOSIS — A4901 Methicillin susceptible Staphylococcus aureus infection, unspecified site: Secondary | ICD-10-CM | POA: Diagnosis not present

## 2020-07-27 DIAGNOSIS — T8459XA Infection and inflammatory reaction due to other internal joint prosthesis, initial encounter: Secondary | ICD-10-CM | POA: Diagnosis not present

## 2020-07-29 DIAGNOSIS — A4901 Methicillin susceptible Staphylococcus aureus infection, unspecified site: Secondary | ICD-10-CM | POA: Diagnosis not present

## 2020-07-29 DIAGNOSIS — T8453XA Infection and inflammatory reaction due to internal right knee prosthesis, initial encounter: Secondary | ICD-10-CM | POA: Diagnosis not present

## 2020-07-29 DIAGNOSIS — T8459XA Infection and inflammatory reaction due to other internal joint prosthesis, initial encounter: Secondary | ICD-10-CM | POA: Diagnosis not present

## 2020-07-29 DIAGNOSIS — Z96659 Presence of unspecified artificial knee joint: Secondary | ICD-10-CM | POA: Diagnosis not present

## 2020-08-01 ENCOUNTER — Encounter: Payer: Self-pay | Admitting: Family Medicine

## 2020-08-02 DIAGNOSIS — A4901 Methicillin susceptible Staphylococcus aureus infection, unspecified site: Secondary | ICD-10-CM | POA: Diagnosis not present

## 2020-08-02 DIAGNOSIS — T8459XA Infection and inflammatory reaction due to other internal joint prosthesis, initial encounter: Secondary | ICD-10-CM | POA: Diagnosis not present

## 2020-08-02 DIAGNOSIS — T8453XA Infection and inflammatory reaction due to internal right knee prosthesis, initial encounter: Secondary | ICD-10-CM | POA: Diagnosis not present

## 2020-08-05 ENCOUNTER — Ambulatory Visit: Payer: 59 | Admitting: Internal Medicine

## 2020-08-05 DIAGNOSIS — A4901 Methicillin susceptible Staphylococcus aureus infection, unspecified site: Secondary | ICD-10-CM | POA: Diagnosis not present

## 2020-08-05 DIAGNOSIS — T8453XA Infection and inflammatory reaction due to internal right knee prosthesis, initial encounter: Secondary | ICD-10-CM | POA: Diagnosis not present

## 2020-08-05 DIAGNOSIS — T8459XA Infection and inflammatory reaction due to other internal joint prosthesis, initial encounter: Secondary | ICD-10-CM | POA: Diagnosis not present

## 2020-08-05 DIAGNOSIS — Z96659 Presence of unspecified artificial knee joint: Secondary | ICD-10-CM | POA: Diagnosis not present

## 2020-08-06 ENCOUNTER — Telehealth: Payer: Self-pay

## 2020-08-06 ENCOUNTER — Other Ambulatory Visit: Payer: Self-pay | Admitting: Internal Medicine

## 2020-08-06 ENCOUNTER — Ambulatory Visit (INDEPENDENT_AMBULATORY_CARE_PROVIDER_SITE_OTHER): Payer: 59 | Admitting: Internal Medicine

## 2020-08-06 ENCOUNTER — Encounter: Payer: Self-pay | Admitting: Internal Medicine

## 2020-08-06 ENCOUNTER — Ambulatory Visit: Payer: 59 | Admitting: Physician Assistant

## 2020-08-06 ENCOUNTER — Other Ambulatory Visit: Payer: 59

## 2020-08-06 ENCOUNTER — Other Ambulatory Visit: Payer: Self-pay

## 2020-08-06 VITALS — HR 85 | Temp 97.7°F | Ht 69.5 in | Wt 278.0 lb

## 2020-08-06 DIAGNOSIS — A4901 Methicillin susceptible Staphylococcus aureus infection, unspecified site: Secondary | ICD-10-CM

## 2020-08-06 DIAGNOSIS — L409 Psoriasis, unspecified: Secondary | ICD-10-CM

## 2020-08-06 DIAGNOSIS — T8459XD Infection and inflammatory reaction due to other internal joint prosthesis, subsequent encounter: Secondary | ICD-10-CM

## 2020-08-06 DIAGNOSIS — B9561 Methicillin susceptible Staphylococcus aureus infection as the cause of diseases classified elsewhere: Secondary | ICD-10-CM | POA: Diagnosis not present

## 2020-08-06 DIAGNOSIS — T8453XA Infection and inflammatory reaction due to internal right knee prosthesis, initial encounter: Secondary | ICD-10-CM | POA: Diagnosis not present

## 2020-08-06 DIAGNOSIS — M71161 Other infective bursitis, right knee: Secondary | ICD-10-CM

## 2020-08-06 DIAGNOSIS — Z96659 Presence of unspecified artificial knee joint: Secondary | ICD-10-CM

## 2020-08-06 MED ORDER — DOXYCYCLINE HYCLATE 100 MG PO TABS: 100.0000 mg | ORAL_TABLET | Freq: Two times a day (BID) | ORAL | 0 refills | Status: DC

## 2020-08-06 MED FILL — DOXYCYCLINE HYCLATE 100 MG: 100 | 10 days supply | Qty: 20 | Fill #0

## 2020-08-06 MED FILL — TALTZ 80 MG/ML AUTOINJECTOR: 80 | 28 days supply | Qty: 1 | Fill #10

## 2020-08-06 NOTE — Progress Notes (Signed)
RFV: follow up for MSSA right knee septic bursitis  Patient ID: [name]., male   DOB: 26-Sep-1960, 59 y.o.   MRN: [id number]  HPI [name] is a 59yo M with history of MSSA right knee bursitis (with previous prosthetic joint infection to that same joint) c/b hematoma/bleeding while on blood thinners. He had his last washout roughly 6 weeks (surgery 10/15) and finishes 6 wk of IV daptomycin today. He reports that his right knee is feeling better less swelling the inferior aspect of his incision has a cm centimeter eschar not draining after the shower, moist. No surrounding erythema  Outpatient Encounter Medications as of 08/06/2020  Medication Sig  . acetaminophen (TYLENOL) 500 MG tablet Take 2 tablets (1,000 mg total) by mouth every 8 (eight) hours.  . carvedilol (COREG) 25 MG tablet TAKE 1 TABLET BY MOUTH 2 TIMES DAILY. (Patient taking differently: Take 25 mg by mouth 2 (two) times daily with a meal. )  . ENTRESTO 24-26 MG TAKE 1 TABLET BY MOUTH TWICE A DAY (Patient taking differently: Take 1 tablet by mouth 2 (two) times daily. )  . glucose blood test strip 1 each by Other route as needed. Use as instructed Freestyle test stripts  . glucose monitoring kit (FREESTYLE) monitoring kit 1 each by Does not apply route as needed.  Marland Kitchen JARDIANCE 10 MG TABS tablet TAKE 1 TABLET BY MOUTH ONCE DAILY  . Lancets (FREESTYLE) lancets 1 each by Other route as needed. Use as instructed  . methocarbamol (ROBAXIN) 500 MG tablet Take 1 tablet (500 mg total) by mouth every 6 (six) hours as needed for muscle spasms.  . Multiple Vitamin (MULTIVITAMIN WITH MINERALS) TABS tablet Take 1 tablet by mouth daily.  . mupirocin ointment (BACTROBAN) 2 % mupirocin 2 % topical ointment  APPLY TOPICALLY TO THE AFFECTED AREA ON THE SKIN TWICE DAILY.  Marland Kitchen oxymetazoline (AFRIN) 0.05 % nasal spray Place 1 spray into both nostrils at bedtime.   . pantoprazole (PROTONIX) 40 MG tablet Take 1 tablet (40 mg total) by mouth 2 (two)  times daily. (Patient taking differently: Take 40 mg by mouth every other day. )  . Vitamin D, Cholecalciferol, 1000 units CAPS Take 1,000 Units by mouth daily.   Alveda Reasons 20 MG TABS tablet TAKE 1 TABLET BY MOUTH DAILY WITH SUPPER. (Patient taking differently: Take 20 mg by mouth daily with supper. )  . doxycycline (VIBRA-TABS) 100 MG tablet Take 1 tablet (100 mg total) by mouth 2 (two) times daily.  Marland Kitchen oxyCODONE (OXY IR/ROXICODONE) 5 MG immediate release tablet Take 1-2 tablets (5-10 mg total) by mouth every 4 (four) hours as needed for moderate pain or severe pain. (Patient not taking: Reported on 07/17/2020)  . rosuvastatin (CRESTOR) 10 MG tablet TAKE 1 TABLET (10 MG TOTAL) BY MOUTH DAILY. (Patient not taking: Reported on 08/06/2020)  . [DISCONTINUED] pregabalin (LYRICA) 100 MG capsule Take 100 mg by mouth 2 (two) times daily. Unsure of mg taking (Patient not taking: Reported on 07/17/2020)   No facility-administered encounter medications on file as of 08/06/2020.     Patient Active Problem List   Diagnosis Date Noted  . Pain and swelling of knee, right 07/09/2020  . Infection of prosthetic right knee joint ([place]) 06/25/2020  . Chronic systolic CHF (congestive heart failure) ([place]) 09/27/2019  . High risk medication use 11/08/2018  . Infection of prosthetic knee joint ([place]) 09/10/2018  . S/P left TKA 06/28/2018  . S/P TKR (total knee replacement), right 05/31/2018  .  Tubular adenoma of colon 10/18/2017  . 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 Diabetic nephropathy associated with type 2 diabetes mellitus ([place]) 10/13/2016  . Permanent atrial fibrillation ([place]) 10/13/2016  . Health examination of defined subpopulation 11/15/2015  . Polycythemia vera ([place]) 10/09/2014  . Hyperlipidemia associated with type 2 diabetes mellitus ([place]) 09/17/2014  . Hypertension associated with diabetes ([place]) 09/17/2014  . Diabetes mellitus ([place]) 10/31/2013  . Hx-TIA (transient ischemic attack)  09/05/2012  . GERD (gastroesophageal reflux disease) 08/17/2011  . Obesity (BMI 30-39.9) 08/17/2011  . Psoriatic arthritis ([place]) 08/17/2011  . Obstructive sleep apnea 08/05/2009     Health Maintenance Due  Topic Date Due  . FOOT EXAM  10/21/2019     Social History   Tobacco Use  . Smoking status: Never Smoker  . Smokeless tobacco: Never Used  Vaping Use  . Vaping Use: Never used  Substance Use Topics  . Alcohol use: Yes    Comment: 2/month  . Drug use: No   Review of Systems   Constitutional: Negative for fever, chills, diaphoresis, activity change, appetite change, fatigue and unexpected weight change.  HENT: Negative for congestion, sore throat, rhinorrhea, sneezing, trouble swallowing and sinus pressure.  Eyes: Negative for photophobia and visual disturbance.  Respiratory: Negative for cough, chest tightness, shortness of breath, wheezing and stridor.  Cardiovascular: Negative for chest pain, palpitations and leg swelling.  Gastrointestinal: Negative for nausea, vomiting, abdominal pain, diarrhea, constipation, blood in stool, abdominal distention and anal bleeding.  Genitourinary: Negative for dysuria, hematuria, flank pain and difficulty urinating.  Musculoskeletal: Negative for myalgias, back pain, joint swelling, arthralgias and gait problem.  Skin: Negative for color change, pallor, rash and wound.  Neurological: Negative for dizziness, tremors, weakness and light-headedness.  Hematological: Negative for adenopathy. Does not bruise/bleed easily.  Psychiatric/Behavioral: Negative for behavioral problems, confusion, sleep disturbance, dysphoric mood, decreased concentration and agitation.    Physical Exam   Pulse 85   Temp 97.7 F (36.5 C) (Oral)   Ht 5' 9.5" (1.765 m)   Wt 278 lb (126.1 kg)   SpO2 95%   BMI 40.46 kg/m    gen = a x o by3 in nad Ext: right arm picc line is c/d/i Right knee = medial superior region is not swollen any longer, incision well  healed. Small subcm eschar along incision inferior aspect. No surrounding erythema CBC Lab Results  Component Value Date   WBC 8.7 06/26/2020   RBC 5.28 06/26/2020   HGB 16.8 06/26/2020   HCT 48.5 06/26/2020   PLT 172 06/26/2020   MCV 91.9 06/26/2020   MCH 31.8 06/26/2020   MCHC 34.6 06/26/2020   RDW 13.2 06/26/2020   LYMPHSABS 2.2 06/04/2020   MONOABS 0.6 06/04/2020   EOSABS 0.2 06/04/2020    BMET Lab Results  Component Value Date   NA 136 07/09/2020   K 4.2 07/09/2020   CL 104 07/09/2020   CO2 24 07/09/2020   GLUCOSE 172 (H) 07/09/2020   BUN 17 07/09/2020   CREATININE 1.08 07/09/2020   CALCIUM 9.1 07/09/2020   GFRNONAA >60 07/09/2020   GFRAA 74 05/27/2020    Lab Results  Component Value Date   ESRSEDRATE 2 06/19/2020     Assessment and Plan  mssa bursitis /sparing PJI= complete 6 wk of daptomycin today. Plan for removal of picc line today. Will switch to doxycycline 125m bid, on full stomach. Has had multiple surgeries,infection to his joint, would favor extended chronic suppression at this time. Plan to see  back in 4 wk  Hx of psoriasis =will give refill on clobetasol cream

## 2020-08-06 NOTE — Telephone Encounter (Signed)
Notified Debbie with [facility] that PICC was pulled in the office today. No complications noted.   Cidney Kirkwood Lorita Officer, RN

## 2020-08-06 NOTE — Progress Notes (Signed)
  Per verbal order from Dr Baxter Flattery, 41 cm Single Lumen Peripherally Inserted Central Catheter removed from right basilic, tip intact. No sutures present. RN confirmed length per chart. Dressing was clean and dry. Petroleum dressing applied. Pt advised no heavy lifting with this arm, leave dressing for 24 hours and call the office or seek emergent care if dressing becomes soaked with blood or swelling or sharp pain presents. Patient verbalized understanding and agreement.  Patient's questions answered to their satisfaction. Patient tolerated procedure well, discharge instructions reviewed with patient. Pharmacy notified.  William Kuchenbecker Lorita Officer, RN

## 2020-08-07 ENCOUNTER — Other Ambulatory Visit: Payer: Self-pay | Admitting: Internal Medicine

## 2020-08-07 MED ORDER — DOXYCYCLINE HYCLATE 100 MG PO TABS: 100.0000 mg | ORAL_TABLET | Freq: Two times a day (BID) | ORAL | 3 refills | Status: DC

## 2020-08-07 MED ORDER — CLOBETASOL PROPIONATE 0.05 % EX CREA: 1.0000 "application " | TOPICAL_CREAM | Freq: Two times a day (BID) | CUTANEOUS | 0 refills | Status: DC

## 2020-08-07 MED FILL — CLOBETASOL 0.05% CREAM: 0.05 | 15 days supply | Qty: 30 | Fill #0

## 2020-08-13 MED FILL — CARVEDILOL 25 MG TABLET: 25 | 90 days supply | Qty: 180 | Fill #1

## 2020-08-14 MED FILL — DOXYCYCLINE HYCLATE 100 MG: 100 | 30 days supply | Qty: 60 | Fill #0

## 2020-08-26 ENCOUNTER — Other Ambulatory Visit: Payer: Self-pay | Admitting: Cardiovascular Disease

## 2020-08-26 MED FILL — XARELTO 20 MG TABLET: 20 | 90 days supply | Qty: 90 | Fill #1

## 2020-08-26 MED FILL — ENTRESTO 24 MG-26 MG TABLET: 24-26 | 30 days supply | Qty: 60 | Fill #0

## 2020-09-02 ENCOUNTER — Ambulatory Visit: Payer: 59 | Admitting: Internal Medicine

## 2020-09-03 ENCOUNTER — Encounter: Payer: Self-pay | Admitting: Internal Medicine

## 2020-09-03 ENCOUNTER — Other Ambulatory Visit: Payer: Self-pay | Admitting: Internal Medicine

## 2020-09-03 ENCOUNTER — Ambulatory Visit: Payer: 59 | Admitting: Internal Medicine

## 2020-09-03 ENCOUNTER — Other Ambulatory Visit: Payer: Self-pay

## 2020-09-03 VITALS — BP 143/95 | HR 82 | Temp 97.9°F | Ht 70.0 in | Wt 284.0 lb

## 2020-09-03 DIAGNOSIS — M71161 Other infective bursitis, right knee: Secondary | ICD-10-CM | POA: Diagnosis not present

## 2020-09-03 DIAGNOSIS — T8459XD Infection and inflammatory reaction due to other internal joint prosthesis, subsequent encounter: Secondary | ICD-10-CM

## 2020-09-03 DIAGNOSIS — Z96659 Presence of unspecified artificial knee joint: Secondary | ICD-10-CM

## 2020-09-03 MED ORDER — DOXYCYCLINE HYCLATE 100 MG PO TABS: 100.0000 mg | ORAL_TABLET | Freq: Two times a day (BID) | ORAL | 11 refills | Status: DC

## 2020-09-03 MED FILL — DOXYCYCLINE HYCLATE 100 MG: 100 | 30 days supply | Qty: 60 | Fill #0

## 2020-09-03 NOTE — Progress Notes (Signed)
Patient ID: William Sill., male   DOB: 01/17/1961, 60 y.o.   MRN: [id number]  HPI 60yo M with left knee prosthetic knee infection, continues on doxycycline bid.no issues with taking abtx. No sunburn   Outpatient Encounter Medications as of 09/03/2020  Medication Sig   acetaminophen (TYLENOL) 500 MG tablet Take 2 tablets (1,000 mg total) by mouth every 8 (eight) hours.   carvedilol (COREG) 25 MG tablet TAKE 1 TABLET BY MOUTH 2 TIMES DAILY. (Patient taking differently: Take 25 mg by mouth 2 (two) times daily with a meal.)   clobetasol cream (TEMOVATE) 0.05 % Apply 1 application topically 2 (two) times daily.   doxycycline (VIBRA-TABS) 100 MG tablet Take 1 tablet (100 mg total) by mouth 2 (two) times daily.   ENTRESTO 24-26 MG TAKE 1 TABLET BY MOUTH TWICE A DAY   glucose blood test strip 1 each by Other route as needed. Use as instructed Freestyle test stripts   glucose monitoring kit (FREESTYLE) monitoring kit 1 each by Does not apply route as needed.   JARDIANCE 10 MG TABS tablet TAKE 1 TABLET BY MOUTH ONCE DAILY   Lancets (FREESTYLE) lancets 1 each by Other route as needed. Use as instructed   Multiple Vitamin (MULTIVITAMIN WITH MINERALS) TABS tablet Take 1 tablet by mouth daily.   oxymetazoline (AFRIN) 0.05 % nasal spray Place 1 spray into both nostrils at bedtime.   pantoprazole (PROTONIX) 40 MG tablet Take 1 tablet (40 mg total) by mouth 2 (two) times daily. (Patient taking differently: Take 40 mg by mouth every other day.)   rosuvastatin (CRESTOR) 10 MG tablet TAKE 1 TABLET (10 MG TOTAL) BY MOUTH DAILY.   Vitamin D, Cholecalciferol, 1000 units CAPS Take 1,000 Units by mouth daily.    XARELTO 20 MG TABS tablet TAKE 1 TABLET BY MOUTH DAILY WITH SUPPER. (Patient taking differently: Take 20 mg by mouth daily with supper.)   methocarbamol (ROBAXIN) 500 MG tablet Take 1 tablet (500 mg total) by mouth every 6 (six) hours as needed for muscle spasms. (Patient not taking: Reported on  09/03/2020)   mupirocin ointment (BACTROBAN) 2 % mupirocin 2 % topical ointment  APPLY TOPICALLY TO THE AFFECTED AREA ON THE SKIN TWICE DAILY. (Patient not taking: Reported on 09/03/2020)   oxyCODONE (OXY IR/ROXICODONE) 5 MG immediate release tablet Take 1-2 tablets (5-10 mg total) by mouth every 4 (four) hours as needed for moderate pain or severe pain. (Patient not taking: No sig reported)   TALTZ 80 MG/ML SOAJ Inject into the skin. (Patient not taking: Reported on 09/03/2020)   No facility-administered encounter medications on file as of 09/03/2020.     Patient Active Problem List   Diagnosis Date Noted   Pain and swelling of knee, right 07/09/2020   Infection of prosthetic right knee joint ([facility]) 06/25/2020   Chronic systolic CHF (congestive heart failure) ([facility]) 09/27/2019   High risk medication use 11/08/2018   Infection of prosthetic knee joint ([facility]) 09/10/2018   S/P left TKA 06/28/2018   S/P TKR (total knee replacement), right 05/31/2018   Tubular adenoma of colon 10/18/2017   BCE (basal cell epithelioma), face 06/21/2017   Diabetic nephropathy associated with type 2 diabetes mellitus ([facility]) 10/13/2016   Permanent atrial fibrillation ([facility]) 10/13/2016   Health examination of defined subpopulation 11/15/2015   Polycythemia vera ([facility]) 10/09/2014   Hyperlipidemia associated with type 2 diabetes mellitus ([facility]) 09/17/2014   Hypertension associated with diabetes ([facility]) 09/17/2014   Diabetes mellitus ([facility]) 10/31/2013  Hx-TIA (transient ischemic attack) 09/05/2012   GERD (gastroesophageal reflux disease) 08/17/2011   Obesity (BMI 30-39.9) 08/17/2011   Psoriatic arthritis ([facility]) 08/17/2011   Obstructive sleep apnea 08/05/2009     Health Maintenance Due  Topic Date Due   FOOT EXAM  10/21/2019     Review of Systems  Physical Exam   Ht 5\' 10"  (1.778 m)   Wt 284 lb (128.8 kg)   SpO2 96%   BMI 40.75 kg/m    Gen = a x o by 3 in nad Ext= knee incision well healed  CBC Lab Results   Component Value Date   WBC 8.7 06/26/2020   RBC 5.28 06/26/2020   HGB 16.8 06/26/2020   HCT 48.5 06/26/2020   PLT 172 06/26/2020   MCV 91.9 06/26/2020   MCH 31.8 06/26/2020   MCHC 34.6 06/26/2020   RDW 13.2 06/26/2020   LYMPHSABS 2.2 06/04/2020   MONOABS 0.6 06/04/2020   EOSABS 0.2 06/04/2020    BMET Lab Results  Component Value Date   NA 136 07/09/2020   K 4.2 07/09/2020   CL 104 07/09/2020   CO2 24 07/09/2020   GLUCOSE 172 (H) 07/09/2020   BUN 17 07/09/2020   CREATININE 1.08 07/09/2020   CALCIUM 9.1 07/09/2020   GFRNONAA >60 07/09/2020   GFRAA 74 05/27/2020      Assessment and Plan  Due to having numerous surgeries, plan to try to do chronic suppression with abtx as long as he tolerates. Will give refills and see back in 3 months

## 2020-09-04 LAB — BASIC METABOLIC PANEL
BUN: 23 mg/dL (ref 7–25)
CO2: 26 mmol/L (ref 20–32)
Calcium: 9.6 mg/dL (ref 8.6–10.3)
Chloride: 102 mmol/L (ref 98–110)
Creat: 1.18 mg/dL (ref 0.70–1.25)
Glucose, Bld: 164 mg/dL — ABNORMAL HIGH (ref 65–99)
Potassium: 4.3 mmol/L (ref 3.5–5.3)
Sodium: 138 mmol/L (ref 135–146)

## 2020-09-04 LAB — CBC WITH DIFFERENTIAL/PLATELET
Absolute Monocytes: 499 cells/uL (ref 200–950)
Basophils Absolute: 38 cells/uL (ref 0–200)
Basophils Relative: 0.6 %
Eosinophils Absolute: 109 cells/uL (ref 15–500)
Eosinophils Relative: 1.7 %
HCT: 54.1 % — ABNORMAL HIGH (ref 38.5–50.0)
Hemoglobin: 18.6 g/dL — ABNORMAL HIGH (ref 13.2–17.1)
Lymphs Abs: 2157 cells/uL (ref 850–3900)
MCH: 30.7 pg (ref 27.0–33.0)
MCHC: 34.4 g/dL (ref 32.0–36.0)
MCV: 89.4 fL (ref 80.0–100.0)
MPV: 9.9 fL (ref 7.5–12.5)
Monocytes Relative: 7.8 %
Neutro Abs: 3597 cells/uL (ref 1500–7800)
Neutrophils Relative %: 56.2 %
Platelets: 155 10*3/uL (ref 140–400)
RBC: 6.05 10*6/uL — ABNORMAL HIGH (ref 4.20–5.80)
RDW: 14.2 % (ref 11.0–15.0)
Total Lymphocyte: 33.7 %
WBC: 6.4 10*3/uL (ref 3.8–10.8)

## 2020-09-04 LAB — C-REACTIVE PROTEIN: CRP: 0.7 mg/L (ref <8.0)

## 2020-09-04 LAB — SEDIMENTATION RATE: Sed Rate: 2 mm/h (ref 0–20)

## 2020-09-04 MED FILL — PANTOPRAZOLE SOD DR 40 MG T: 40 | 90 days supply | Qty: 180 | Fill #1

## 2020-09-12 DIAGNOSIS — G4733 Obstructive sleep apnea (adult) (pediatric): Secondary | ICD-10-CM | POA: Diagnosis not present

## 2020-09-30 ENCOUNTER — Other Ambulatory Visit: Payer: Self-pay | Admitting: Cardiovascular Disease

## 2020-09-30 MED ORDER — ENTRESTO 24-26 MG PO TABS
1.0000 | ORAL_TABLET | Freq: Two times a day (BID) | ORAL | 3 refills | Status: DC
Start: 2020-09-30 — End: 2020-09-30

## 2020-09-30 MED FILL — ENTRESTO 24 MG-26 MG TABLET: 24-26 | 30 days supply | Qty: 60 | Fill #1

## 2020-09-30 MED FILL — TALTZ 80 MG/ML AUTOINJECTOR: 80 | 28 days supply | Qty: 1 | Fill #11

## 2020-10-14 ENCOUNTER — Other Ambulatory Visit: Payer: Self-pay | Admitting: Family Medicine

## 2020-10-14 DIAGNOSIS — E1121 Type 2 diabetes mellitus with diabetic nephropathy: Secondary | ICD-10-CM

## 2020-10-14 DIAGNOSIS — E785 Hyperlipidemia, unspecified: Secondary | ICD-10-CM

## 2020-10-14 DIAGNOSIS — E1169 Type 2 diabetes mellitus with other specified complication: Secondary | ICD-10-CM

## 2020-10-14 MED FILL — JARDIANCE 10 MG TABLET: 10 | 90 days supply | Qty: 90 | Fill #0

## 2020-10-14 MED FILL — ROSUVASTATIN CALCIUM 10 MG: 10 | 90 days supply | Qty: 90 | Fill #0

## 2020-10-14 MED FILL — DOXYCYCLINE HYCLATE 100 MG: 100 | 30 days supply | Qty: 60 | Fill #1

## 2020-10-23 ENCOUNTER — Other Ambulatory Visit: Payer: Self-pay

## 2020-10-23 ENCOUNTER — Encounter: Payer: Self-pay | Admitting: Medical

## 2020-10-23 ENCOUNTER — Ambulatory Visit: Payer: Self-pay | Admitting: Medical

## 2020-10-23 VITALS — BP 140/80 | HR 70 | Ht 70.0 in | Wt 286.6 lb

## 2020-10-23 DIAGNOSIS — Z0000 Encounter for general adult medical examination without abnormal findings: Secondary | ICD-10-CM

## 2020-10-23 LAB — POCT URINALYSIS DIP (PROADVANTAGE DEVICE)
Bilirubin, UA: NEGATIVE
Blood, UA: NEGATIVE
Glucose, UA: >=1000 mg/dL — AB
Ketones, POC UA: NEGATIVE mg/dL
Leukocytes, UA: NEGATIVE
Nitrite, UA: NEGATIVE
Protein Ur, POC: 30 mg/dL — AB
Specific Gravity, Urine: 1.025
Urobilinogen, Ur: 0.2
pH, UA: 5.5 (ref 5.0–8.0)

## 2020-10-23 NOTE — Progress Notes (Addendum)
William D Mickiewicz Jr. is a 60 y.o. male who presents today for a commercial driver fitness determination physical exam.  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   Last DOT physical 10/2019 here with me.  Medical care team includes:  Dr. Redmond School here for primary care since 1976.  Dr. [name], orthopedics  Dr. Mertie Moores, cardiology  He has hx/o HTN, PFO, s/p occlusion device, hx/o chronic CHF.       He has seen Dr. Redmond School here for diabetes follow-up recently, saw cardiology in the fall 2021.  His main ongoing issue is prior multiple surgeries of the right knee and prior infection of the right knee joint.  He is on chronic antibiotic prophylaxis.  He sees infectious disease and orthopedics regularly for this.  He is compliant with CPAP  He is compliant with his medicines for heart and diabetes and cholesterol  Otherwise feeling fine and normal state of health  Review of Systems A comprehensive review of systems was reviewed and noted as below:  Eye: +uses reading glasses, but otherwise no corrective lenses, -lasik surgery or other eye surgery, -glaucoma, -cataracts, -macular degeneration, -monocular vision, -medication for eye condition, -blurred vision,   Ears: -hearing problems, - hearing aids, -ear pain, -ear drainage, -ear fullness, -tinnitus, -recurrent ear infection, -previous ear surgery, - vertigo, -meniere's disease  Endocrine: -polydipsia, -polyuria, -intentional weight loss, -fainting, -dizziness, - altered or loss of consciousness, -hypoglycemia  Cardiovascular: +heart disease, +CHF, -heart attack, -cardiac stents, -bypass surgery, +other heart surgery/prior procedure for PFO, +hypertension, -blood clots, -pacemaker, -medications for heart condition, -chest pain, -SOB, -palpitations, -fainting, -dizziness,  -dyspnea  Respiratory: -asthma, -COPD, other lung disease, -smoker, -chest tightness, - wheezing, -snoring, -daytime sleepiness, +sleep apnea/uses CPAP, -narcolepsy  Allergy: -uncontrollable sneezing or allergy symptoms  Musculoskeletal: -missing body parts, -muscle disease, -bone disease, -spine injury, -low back pain, -medication for joints, bones, muscles or pain, -physical limitations, -joint pain, -neck pain, -limitations of neck ROM, -back surgery, +left hip replacement,  +hx/o psoriasis, -gout  Neurologic: -neurologic disease, -dementia, -seizures, -parkinson's, -tremor, -memory problems, -weakness, -numbness, -tingling, -medication for neurologic condition, -medications for sleep condition  Gastric: -abdominal pain, -chronic diarrhea or IBS, -uncontrollable nausea  Kidney/Renal: -hematuria, -dialysis, kidney disease, polycystic kidney disease  Psychiatric: -homicidal thoughts, -suicidal thoughts, -prior suicide attempts, -get into fights/hurting others, -memory or concentration problems, -delusions, -hallucinations, -hospitalization for mental health problem, -depression, -anxiety, -bipolar  Drug use: - none  Reviewed their medical, surgical, family, social, medication, and allergy history and updated chart as appropriate.     Objective:   Physical Exam  BP 140/80   Pulse 70   Ht 5\' 10"  (1.778 m)   Wt 286 lb 9.6 oz (130 kg)   SpO2 95%   BMI 41.12 kg/m   Wt Readings from Last 3 Encounters:  10/23/20 286 lb 9.6 oz (130 kg)  09/03/20 284 lb (128.8 kg)  08/06/20 278 lb (126.1 kg)   BP Readings from Last 3 Encounters:  10/23/20 140/80  09/03/20 (!) 143/95  07/17/20 125/85    General appearance: alert, no distress, WD/WN, obese white male Skin: no worrisome findings otherwise , no warmth or redness of right knee Neck: supple, no lymphadenopathy, no thyromegaly, no masses, normal ROM, no bruits Chest: non tender, normal shape and expansion Heart: RRR, normal S1, S2, no  murmurs Lungs: CTA bilaterally, no wheezes,  rhonchi, or rales Abdomen: +bs, soft, obese, non tender, non distended, no masses, no hepatomegaly, no splenomegaly, no bruits Back: non tender, normal ROM, no scoliosis Musculoskeletal:  bilat knee surgical scars, linearly vertically scar of right knee otherwise left buttock/posterior hip surgical scar, left forearm dorsal with diagonal long scar from childhood trauma, otherwise upper extremities non tender, no obvious deformity, normal ROM throughout, lower extremities non tender, no obvious deformity, normal ROM throughout Extremities: right knee with mild to moderate swelling, otherwise no edema, no cyanosis, no clubbing Pulses: 2+ symmetric, upper and lower extremities, normal cap refill Neurological: nonfocal exam Psychiatric: normal affect, behavior normal, pleasant  GU: no hernia Rectal: deferred  Vision, hearing, urinalysis reviewed.      Assessment:   Encounter Diagnosis  Name Primary?  . Routine general medical examination at a health care facility Yes    Plan:   DOT evaluation -I will hold his paperwork for now.  I am waiting on cardiology to send a notification that he is cleared from their perspective.  Based on his August 2021 cardiology notes, things seem stable and his current therapies will continue ongoing indefinitely.  I did counsel on dietary changes to help lose weight.  He is active and exercises regularly.  He is considering the [place] nutrition program  Chronic afib and Anticoagulation long term, chronic CHF - reviewed 03/2020 cardiology notes from Dr. Acie Fredrickson.   Continues on Coreg, [place], continues on Xarelto.        S/p bilat Knee replacement, s/p multiple surgeries 2019- 2021 right knee, sepsis 2020 s/p surgery.  Overall stable.  He continues on chronic antibiotic prophylaxis.  He sees infectious disease for prior joint infection as well.   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   Diabetes - HgbA1C  less than 7 % in recent months.   Controlled on current medication.  HTN - controlled on current medication  OSA on CPAP, compliant.  I reviewed his recent compliance report that shows good compliance  History of TIA with repair of PFO  F/u with Dr. Redmond School and specialist as planned.

## 2020-10-24 ENCOUNTER — Telehealth: Payer: Self-pay | Admitting: Medical

## 2020-10-24 MED FILL — ENTRESTO 24 MG-26 MG TABLET: 24-26 | 30 days supply | Qty: 60 | Fill #2

## 2020-10-24 NOTE — Telephone Encounter (Signed)
The card is on the back of the packet where it normally is. It has been placed on your desk if you need to sign

## 2020-10-24 NOTE — Telephone Encounter (Signed)
Forms has been filled and patient was made aware that DOT paperwork was is ready for pick up.

## 2020-10-24 NOTE — Telephone Encounter (Signed)
Copy DOT forms Copy resmed form I never saw a wallet card to complete Get him originals Keep copies for [place] I have uploaded info to the federal website Have him turn in the "medical certificate" form to [place] [employer]

## 2020-10-24 NOTE — Telephone Encounter (Signed)
done

## 2020-10-29 ENCOUNTER — Other Ambulatory Visit: Payer: Self-pay | Admitting: Pharmacist

## 2020-10-29 MED ORDER — TALTZ 80 MG/ML ~~LOC~~ SOAJ
SUBCUTANEOUS | 11 refills | Status: DC
Start: 2020-10-29 — End: 2020-10-29

## 2020-10-29 MED FILL — TALTZ 80 MG/ML AUTOINJECTOR: 80 | 1 days supply | Qty: 1 | Fill #0

## 2020-11-19 DIAGNOSIS — L400 Psoriasis vulgaris: Secondary | ICD-10-CM | POA: Diagnosis not present

## 2020-11-19 DIAGNOSIS — Z85828 Personal history of other malignant neoplasm of skin: Secondary | ICD-10-CM | POA: Diagnosis not present

## 2020-11-19 DIAGNOSIS — L814 Other melanin hyperpigmentation: Secondary | ICD-10-CM | POA: Diagnosis not present

## 2020-11-22 MED FILL — TALTZ 80 MG/ML AUTOINJECTOR: 80 | 28 days supply | Qty: 1 | Fill #1

## 2020-11-25 ENCOUNTER — Other Ambulatory Visit: Payer: Self-pay | Admitting: Cardiovascular Disease

## 2020-11-25 ENCOUNTER — Other Ambulatory Visit: Payer: Self-pay | Admitting: Family Medicine

## 2020-11-25 ENCOUNTER — Other Ambulatory Visit: Payer: Self-pay

## 2020-11-25 ENCOUNTER — Ambulatory Visit (INDEPENDENT_AMBULATORY_CARE_PROVIDER_SITE_OTHER): Payer: 59 | Admitting: Family Medicine

## 2020-11-25 ENCOUNTER — Encounter: Payer: Self-pay | Admitting: Family Medicine

## 2020-11-25 VITALS — BP 104/78 | HR 75 | Temp 97.2°F | Ht 69.0 in | Wt 283.8 lb

## 2020-11-25 DIAGNOSIS — D45 Polycythemia vera: Secondary | ICD-10-CM

## 2020-11-25 DIAGNOSIS — K219 Gastro-esophageal reflux disease without esophagitis: Secondary | ICD-10-CM

## 2020-11-25 DIAGNOSIS — E1169 Type 2 diabetes mellitus with other specified complication: Secondary | ICD-10-CM | POA: Diagnosis not present

## 2020-11-25 DIAGNOSIS — Z8673 Personal history of transient ischemic attack (TIA), and cerebral infarction without residual deficits: Secondary | ICD-10-CM | POA: Diagnosis not present

## 2020-11-25 DIAGNOSIS — D126 Benign neoplasm of colon, unspecified: Secondary | ICD-10-CM

## 2020-11-25 DIAGNOSIS — E1121 Type 2 diabetes mellitus with diabetic nephropathy: Secondary | ICD-10-CM | POA: Diagnosis not present

## 2020-11-25 DIAGNOSIS — T8459XD Infection and inflammatory reaction due to other internal joint prosthesis, subsequent encounter: Secondary | ICD-10-CM

## 2020-11-25 DIAGNOSIS — E669 Obesity, unspecified: Secondary | ICD-10-CM | POA: Diagnosis not present

## 2020-11-25 DIAGNOSIS — L4050 Arthropathic psoriasis, unspecified: Secondary | ICD-10-CM | POA: Diagnosis not present

## 2020-11-25 DIAGNOSIS — Z23 Encounter for immunization: Secondary | ICD-10-CM

## 2020-11-25 DIAGNOSIS — T383X5D Adverse effect of insulin and oral hypoglycemic [antidiabetic] drugs, subsequent encounter: Secondary | ICD-10-CM

## 2020-11-25 DIAGNOSIS — I152 Hypertension secondary to endocrine disorders: Secondary | ICD-10-CM

## 2020-11-25 DIAGNOSIS — G4733 Obstructive sleep apnea (adult) (pediatric): Secondary | ICD-10-CM

## 2020-11-25 DIAGNOSIS — E785 Hyperlipidemia, unspecified: Secondary | ICD-10-CM | POA: Diagnosis not present

## 2020-11-25 DIAGNOSIS — Z0000 Encounter for general adult medical examination without abnormal findings: Secondary | ICD-10-CM

## 2020-11-25 DIAGNOSIS — Z96652 Presence of left artificial knee joint: Secondary | ICD-10-CM

## 2020-11-25 DIAGNOSIS — E1159 Type 2 diabetes mellitus with other circulatory complications: Secondary | ICD-10-CM | POA: Diagnosis not present

## 2020-11-25 DIAGNOSIS — I5022 Chronic systolic (congestive) heart failure: Secondary | ICD-10-CM

## 2020-11-25 DIAGNOSIS — Z96659 Presence of unspecified artificial knee joint: Secondary | ICD-10-CM

## 2020-11-25 DIAGNOSIS — I4821 Permanent atrial fibrillation: Secondary | ICD-10-CM

## 2020-11-25 DIAGNOSIS — Z96651 Presence of right artificial knee joint: Secondary | ICD-10-CM

## 2020-11-25 DIAGNOSIS — T8453XD Infection and inflammatory reaction due to internal right knee prosthesis, subsequent encounter: Secondary | ICD-10-CM

## 2020-11-25 DIAGNOSIS — Z0289 Encounter for other administrative examinations: Secondary | ICD-10-CM

## 2020-11-25 LAB — POCT URINALYSIS DIP (PROADVANTAGE DEVICE)
Bilirubin, UA: NEGATIVE
Blood, UA: NEGATIVE
Glucose, UA: NEGATIVE mg/dL
Ketones, POC UA: NEGATIVE mg/dL
Leukocytes, UA: NEGATIVE
Nitrite, UA: NEGATIVE
Protein Ur, POC: 100 mg/dL — AB
Specific Gravity, Urine: 1.015
Urobilinogen, Ur: 0.2
pH, UA: 6 (ref 5.0–8.0)

## 2020-11-25 LAB — POCT GLYCOSYLATED HEMOGLOBIN (HGB A1C): Hemoglobin A1C: 6.8 % — AB (ref 4.0–5.6)

## 2020-11-25 MED FILL — XARELTO 20 MG TABLET: 20 | 90 days supply | Qty: 90 | Fill #0

## 2020-11-25 MED FILL — DOXYCYCLINE HYCLATE 100 MG: 100 | 30 days supply | Qty: 60 | Fill #2

## 2020-11-25 MED FILL — ENTRESTO 24 MG-26 MG TABLET: 24-26 | 30 days supply | Qty: 60 | Fill #3

## 2020-11-25 NOTE — Telephone Encounter (Signed)
Pt last saw Dr Acie Fredrickson 04/01/20, last labs 09/03/20 Creat 1.18, age 60, weight 128.7kg, CrCl 119.67, based on CrCl pt is on appropriate dosage of Xarelto 20mg  QD.  Will refill rx.

## 2020-11-25 NOTE — Progress Notes (Signed)
Subjective:    Patient ID: William Barges., male    DOB: October 10, 1960, 60 y.o.   MRN: [id number]  HPI He is here for complete examination.  At this point he seems to be doing quite nicely.  He has had extensive trouble with his right knee with replacements and subsequent infection.  Presently he seems to be doing well on doxycycline twice per day.  He does complain of nocturia but no hesitancy, decreased stream, incomplete emptying or urgency.  He does keep himself well-hydrated.  He continues on Entresto and Xarelto as well as Coreg and is followed by cardiology.  He has had no chest pain, shortness of breath.  No neurologic symptoms.  Does have a previous history of TIA with closure of PFO.  His reflux is under good control.  He will try to switch to every other day dosing of his Protonix.  His weight seems be fairly stable.  He is doing well on Talz for his underlying psoriatic arthritis/skin issues.  He continues on Jardiance.  He is not taking Metformin as he has had side effects from that medication.  Crestor is causing no difficulty.  He does have a history of polycythemia with the most recent hematocrit of around 54.  He does have OSA and continues on his CPAP.  He is very comfortable with that.  Otherwise he has no other concerns or complaints.  Family and social history as well as health maintenance and immunizations was reviewed   Review of Systems  All other systems reviewed and are negative.      Objective:   Physical Exam Alert and in no distress. Tympanic membranes and canals are normal. Pharyngeal area is normal. Neck is supple without adenopathy or thyromegaly. Cardiac exam shows a regular sinus rhythm without murmurs or gallops. Lungs are clear to auscultation.  Abdominal exam difficult to do due to size but no masses palpable. Hemoglobin A1c is 6.8.       Assessment & Plan:  Routine general medical examination at a health care facility - Plan: POCT Urinalysis DIP  (Proadvantage Device)  Obstructive sleep apnea  Hx-TIA (transient ischemic attack)  Gastroesophageal reflux disease without esophagitis  Obesity (BMI 30-39.9)  Psoriatic arthritis ([place])  Type 2 diabetes mellitus with diabetic nephropathy, without long-term current use of insulin ([place]) - Plan: POCT glycosylated hemoglobin (Hb A1C), POCT Urinalysis DIP (Proadvantage Device)  Hyperlipidemia associated with type 2 diabetes mellitus ([place]) - Plan: Lipid panel  Hypertension associated with diabetes ([place])  Polycythemia vera ([place]) - Plan: CBC with Differential/Platelet  Health examination of defined subpopulation  Diabetic nephropathy associated with type 2 diabetes mellitus ([place])  Permanent atrial fibrillation (H[facility])  Tubular adenoma of colon  S/P TKR (total knee replacement), right  Chronic systolic CHF (congestive heart failure) (H[facility])  S/P left TKA  Need for shingles vaccine  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  He will call when he needs refills on all of these.  Discussed diet and exercise with him.  He is as active as he can be with his underlying arthritis which does limit him in terms of activity.  Did recommend he use Tylenol for aches and pains..  We will also give Shingrix and Tdap today.  He will continue to be followed by ID, cardiology, orthopedics I then discussed the nocturia.  Discussed fluids  with him.  His symptoms do not sound like OAB or prostate related and therefore we will have him try to reduce his fluid intake at night to see if that can cut down on his nocturia.  He was comfortable with that.

## 2020-11-26 ENCOUNTER — Other Ambulatory Visit (HOSPITAL_COMMUNITY): Payer: Self-pay

## 2020-11-26 LAB — CBC WITH DIFFERENTIAL/PLATELET
Basophils Absolute: 0 10*3/uL (ref 0.0–0.2)
Basos: 1 %
EOS (ABSOLUTE): 0.1 10*3/uL (ref 0.0–0.4)
Eos: 1 %
Hematocrit: 57.9 % — ABNORMAL HIGH (ref 37.5–51.0)
Hemoglobin: 20.5 g/dL (ref 13.0–17.7)
Immature Grans (Abs): 0 10*3/uL (ref 0.0–0.1)
Immature Granulocytes: 1 %
Lymphocytes Absolute: 1.6 10*3/uL (ref 0.7–3.1)
Lymphs: 25 %
MCH: 31 pg (ref 26.6–33.0)
MCHC: 35.4 g/dL (ref 31.5–35.7)
MCV: 88 fL (ref 79–97)
Monocytes Absolute: 0.5 10*3/uL (ref 0.1–0.9)
Monocytes: 8 %
Neutrophils Absolute: 4.2 10*3/uL (ref 1.4–7.0)
Neutrophils: 64 %
Platelets: 163 10*3/uL (ref 150–450)
RBC: 6.61 x10E6/uL — ABNORMAL HIGH (ref 4.14–5.80)
RDW: 16.4 % — ABNORMAL HIGH (ref 11.6–15.4)
WBC: 6.4 10*3/uL (ref 3.4–10.8)

## 2020-11-26 LAB — LIPID PANEL
Chol/HDL Ratio: 3.2 ratio (ref 0.0–5.0)
Cholesterol, Total: 111 mg/dL (ref 100–199)
HDL: 35 mg/dL — ABNORMAL LOW (ref >39)
LDL Chol Calc (NIH): 38 mg/dL (ref 0–99)
Triglycerides: 248 mg/dL — ABNORMAL HIGH (ref 0–149)
VLDL Cholesterol Cal: 38 mg/dL (ref 5–40)

## 2020-11-27 ENCOUNTER — Telehealth: Payer: Self-pay | Admitting: Internal Medicine

## 2020-11-27 NOTE — Telephone Encounter (Signed)
Scheduled appts per 3/30 sch msg. Pt aware.  

## 2020-11-30 ENCOUNTER — Other Ambulatory Visit (HOSPITAL_COMMUNITY): Payer: Self-pay

## 2020-12-02 ENCOUNTER — Ambulatory Visit: Payer: 59 | Admitting: Internal Medicine

## 2020-12-02 ENCOUNTER — Encounter: Payer: Self-pay | Admitting: Internal Medicine

## 2020-12-02 ENCOUNTER — Other Ambulatory Visit: Payer: Self-pay

## 2020-12-02 ENCOUNTER — Other Ambulatory Visit (HOSPITAL_COMMUNITY): Payer: Self-pay

## 2020-12-02 VITALS — BP 137/81 | HR 61 | Temp 97.7°F | Ht 70.0 in | Wt 287.0 lb

## 2020-12-02 DIAGNOSIS — Z8619 Personal history of other infectious and parasitic diseases: Secondary | ICD-10-CM | POA: Diagnosis not present

## 2020-12-02 DIAGNOSIS — L409 Psoriasis, unspecified: Secondary | ICD-10-CM | POA: Diagnosis not present

## 2020-12-02 DIAGNOSIS — Z96659 Presence of unspecified artificial knee joint: Secondary | ICD-10-CM | POA: Diagnosis not present

## 2020-12-02 DIAGNOSIS — A4901 Methicillin susceptible Staphylococcus aureus infection, unspecified site: Secondary | ICD-10-CM

## 2020-12-02 DIAGNOSIS — T8459XD Infection and inflammatory reaction due to other internal joint prosthesis, subsequent encounter: Secondary | ICD-10-CM | POA: Diagnosis not present

## 2020-12-02 DIAGNOSIS — D45 Polycythemia vera: Secondary | ICD-10-CM | POA: Diagnosis not present

## 2020-12-02 MED ORDER — DOXYCYCLINE HYCLATE 100 MG PO TABS
100.0000 mg | ORAL_TABLET | Freq: Two times a day (BID) | ORAL | 3 refills | Status: DC
  Filled 2020-12-02 – 2020-12-19 (×2): qty 180, 90d supply, fill #0
  Filled 2021-03-19: qty 180, 90d supply, fill #1
  Filled 2021-06-22: qty 180, 90d supply, fill #2

## 2020-12-02 NOTE — Progress Notes (Signed)
RFV: follow up for hx of prepatellar knee infection/hx of chronic pji  Patient ID: [name]., male   DOB: 31-May-1961, 60 y.o.   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 He is doing well overall. He was started back on infusion for psoriatic arthritis and in addition,  Has upcoming phlebotomy for polycythemia vera -- once or twice a year. (with dr Inda Merlin)    Soc hx: going [place] beach [place], spends a lot of time outdoors Outpatient Encounter Medications as of 12/02/2020  Medication Sig  . acetaminophen (TYLENOL) 500 MG tablet Take 2 tablets (1,000 mg total) by mouth every 8 (eight) hours.  . carvedilol (COREG) 25 MG tablet TAKE 1 TABLET BY MOUTH 2 TIMES DAILY. (Patient taking differently: Take 25 mg by mouth 2 (two) times daily with a meal.)  . clobetasol cream (TEMOVATE) 0.05 % APPLY TOPICALLY TO AFFECTED AREA TWICE DAILY. (Patient taking differently: Apply topically 2 (two) times daily. to affected area)  . doxycycline (VIBRA-TABS) 100 MG tablet TAKE 1 TABLET (100 MG TOTAL) BY MOUTH 2 (TWO) TIMES DAILY.  Marland Kitchen doxycycline (VIBRAMYCIN) 100 MG capsule TAKE 1 CAPSULE BY MOUTH TWICE DAILY.  Marland Kitchen empagliflozin (JARDIANCE) 10 MG TABS tablet TAKE 1 TABLET BY MOUTH ONCE DAILY  . glucose blood test strip 1 each by Other route as needed. Use as instructed Freestyle test stripts  . glucose monitoring kit (FREESTYLE) monitoring kit 1 each by Does not apply route as needed.  . Lancets (FREESTYLE) lancets 1 each by Other route as needed. Use as instructed  . methocarbamol (ROBAXIN) 500 MG tablet TAKE 1 TABLET (500 MG TOTAL) BY MOUTH EVERY 6 (SIX) HOURS AS NEEDED FOR MUSCLE SPASMS.  . Multiple Vitamin (MULTIVITAMIN WITH MINERALS) TABS tablet Take 1  tablet by mouth daily.  . mupirocin ointment (BACTROBAN) 2 % mupirocin 2 % topical ointment  APPLY TOPICALLY TO THE AFFECTED AREA ON THE SKIN TWICE DAILY.  Marland Kitchen oxymetazoline (AFRIN) 0.05 % nasal spray Place 1 spray into both nostrils at bedtime.  . pantoprazole (PROTONIX) 40 MG tablet TAKE 1 TABLET (40 MG TOTAL) BY MOUTH 2 (TWO) TIMES DAILY. (Patient taking differently: Take 40 mg by mouth every other day.)  . rivaroxaban (XARELTO) 20 MG TABS tablet TAKE 1 TABLET BY MOUTH DAILY WITH SUPPER.  . rosuvastatin (CRESTOR) 10 MG tablet TAKE 1 TABLET (10 MG TOTAL) BY MOUTH DAILY.  . sacubitril-valsartan (ENTRESTO) 24-26 MG TAKE 1 TABLET BY MOUTH 2 (TWO) TIMES DAILY.  Marland Kitchen TALTZ 80 MG/ML SOAJ INJECT 1 MG BELOW THE SKIN AS DIRECTED; INJECT UNDER THE SKIN EVERY FOUR WEEKS.  . Vitamin D, Cholecalciferol, 1000 units CAPS Take 1,000 Units by mouth daily.   . cephALEXin (KEFLEX) 500 MG capsule TAKE 1 CAPSULE BY MOUTH 4 TIMES A DAY FOR 2 WEEKS (Patient not taking: Reported on 12/02/2020)   No facility-administered encounter medications on file as of 12/02/2020.     Patient Active Problem List   Diagnosis Date Noted  . Infection of prosthetic right knee joint ([place]) 06/25/2020  . Chronic systolic CHF (congestive heart failure) ([place]) 09/27/2019  . High risk medication use 11/08/2018  . 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 S/P left TKA 06/28/2018  . S/P TKR (total knee replacement), right 05/31/2018  .  Tubular adenoma of colon 10/18/2017  . 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 Diabetic nephropathy associated with type 2 diabetes mellitus ([place]) 10/13/2016  . Permanent atrial fibrillation ([place]) 10/13/2016  . Health examination of defined subpopulation 11/15/2015  . Polycythemia vera ([place]) 10/09/2014  . Hyperlipidemia associated with type 2 diabetes mellitus ([place]) 09/17/2014  . Hypertension associated with diabetes ([place]) 09/17/2014  . Diabetes mellitus ([place]) 10/31/2013  . Hx-TIA  (transient ischemic attack) 09/05/2012  . GERD (gastroesophageal reflux disease) 08/17/2011  . Obesity (BMI 30-39.9) 08/17/2011  . Psoriatic arthritis ([place]) 08/17/2011  . Obstructive sleep apnea 08/05/2009     Health Maintenance Due  Topic Date Due  . FOOT EXAM  10/21/2019  . COVID-19 Vaccine (4 - Booster for Pfizer series) 11/24/2020     Review of Systems  Constitutional: Negative for fever, chills, diaphoresis, activity change, appetite change, fatigue and unexpected weight change.  HENT: Negative for congestion, sore throat, rhinorrhea, sneezing, trouble swallowing and sinus pressure.  Eyes: Negative for photophobia and visual disturbance.  Respiratory: Negative for cough, chest tightness, shortness of breath, wheezing and stridor.  Cardiovascular: Negative for chest pain, palpitations and leg swelling.  Gastrointestinal: Negative for nausea, vomiting, abdominal pain, diarrhea, constipation, blood in stool, abdominal distention and anal bleeding.  Genitourinary: Negative for dysuria, hematuria, flank pain and difficulty urinating.  Musculoskeletal: Negative for myalgias, back pain, joint swelling, arthralgias and gait problem.  Skin: Negative for color change, pallor, rash and wound.  Neurological: Negative for dizziness, tremors, weakness and light-headedness.  Hematological: Negative for adenopathy. Does not bruise/bleed easily.  Psychiatric/Behavioral: Negative for behavioral problems, confusion, sleep disturbance, dysphoric mood, decreased concentration and agitation.    Physical Exam   BP 137/81   Pulse 61   Temp 97.7 F (36.5 C)   Ht 5' 10"  (1.778 m)   Wt 287 lb (130.2 kg)   BMI 41.18 kg/m   gen = a x o by 3 in nad heent = MMM, PERRLA, EOMI, OP clear Ext= right knee well healed incision, has small hard pustule on the incision line not fluctuant Skin = no signs of erythema or sunburn CBC Lab Results  Component Value Date   WBC 6.4 11/25/2020   RBC 6.61 (H)  11/25/2020   HGB 20.5 (HH) 11/25/2020   HCT 57.9 (H) 11/25/2020   PLT 163 11/25/2020   MCV 88 11/25/2020   MCH 31.0 11/25/2020   MCHC 35.4 11/25/2020   RDW 16.4 (H) 11/25/2020   LYMPHSABS 1.6 11/25/2020   MONOABS 0.6 06/04/2020   EOSABS 0.1 11/25/2020    BMET Lab Results  Component Value Date   NA 138 09/03/2020   K 4.3 09/03/2020   CL 102 09/03/2020   CO2 26 09/03/2020   GLUCOSE 164 (H) 09/03/2020   BUN 23 09/03/2020   CREATININE 1.18 09/03/2020   CALCIUM 9.6 09/03/2020   GFRNONAA >60 07/09/2020   GFRAA 74 05/27/2020      Assessment and Plan Prepatellar/chronic suppression of pji = continue on doycycline. Gave precautions about sun sensitivity. Continue on sunscreen. Planning on long term suppression since he has history of recurrent infections  PCV = hgb at 20.5! Has upcoming blood letting with hematology  Health maintenance = uptodate on covid vaccine   Psoriasis = possibly has small plaque developing near incision which we will keep an eye on.

## 2020-12-04 ENCOUNTER — Other Ambulatory Visit (HOSPITAL_COMMUNITY): Payer: Self-pay

## 2020-12-11 ENCOUNTER — Encounter: Payer: Self-pay | Admitting: Internal Medicine

## 2020-12-11 ENCOUNTER — Inpatient Hospital Stay: Payer: 59 | Attending: Internal Medicine

## 2020-12-11 ENCOUNTER — Other Ambulatory Visit: Payer: Self-pay

## 2020-12-11 ENCOUNTER — Inpatient Hospital Stay: Payer: 59

## 2020-12-11 ENCOUNTER — Inpatient Hospital Stay: Payer: 59 | Admitting: Internal Medicine

## 2020-12-11 VITALS — BP 153/96 | HR 79 | Temp 97.1°F | Resp 20 | Ht 70.0 in | Wt 285.5 lb

## 2020-12-11 VITALS — BP 111/91 | HR 62 | Resp 18

## 2020-12-11 DIAGNOSIS — D45 Polycythemia vera: Secondary | ICD-10-CM

## 2020-12-11 DIAGNOSIS — D751 Secondary polycythemia: Secondary | ICD-10-CM | POA: Diagnosis not present

## 2020-12-11 DIAGNOSIS — Z96651 Presence of right artificial knee joint: Secondary | ICD-10-CM | POA: Diagnosis not present

## 2020-12-11 DIAGNOSIS — G4733 Obstructive sleep apnea (adult) (pediatric): Secondary | ICD-10-CM | POA: Diagnosis not present

## 2020-12-11 DIAGNOSIS — Z8673 Personal history of transient ischemic attack (TIA), and cerebral infarction without residual deficits: Secondary | ICD-10-CM | POA: Insufficient documentation

## 2020-12-11 DIAGNOSIS — R5383 Other fatigue: Secondary | ICD-10-CM | POA: Insufficient documentation

## 2020-12-11 LAB — CBC WITH DIFFERENTIAL (CANCER CENTER ONLY)
Abs Immature Granulocytes: 0.03 10*3/uL (ref 0.00–0.07)
Basophils Absolute: 0 10*3/uL (ref 0.0–0.1)
Basophils Relative: 1 %
Eosinophils Absolute: 0.1 10*3/uL (ref 0.0–0.5)
Eosinophils Relative: 2 %
HCT: 54.4 % — ABNORMAL HIGH (ref 39.0–52.0)
Hemoglobin: 19.6 g/dL — ABNORMAL HIGH (ref 13.0–17.0)
Immature Granulocytes: 1 %
Lymphocytes Relative: 31 %
Lymphs Abs: 2 10*3/uL (ref 0.7–4.0)
MCH: 31.1 pg (ref 26.0–34.0)
MCHC: 36 g/dL (ref 30.0–36.0)
MCV: 86.3 fL (ref 80.0–100.0)
Monocytes Absolute: 0.6 10*3/uL (ref 0.1–1.0)
Monocytes Relative: 9 %
Neutro Abs: 3.8 10*3/uL (ref 1.7–7.7)
Neutrophils Relative %: 56 %
Platelet Count: 163 10*3/uL (ref 150–400)
RBC: 6.3 MIL/uL — ABNORMAL HIGH (ref 4.22–5.81)
RDW: 14.6 % (ref 11.5–15.5)
WBC Count: 6.6 10*3/uL (ref 4.0–10.5)
nRBC: 0 % (ref 0.0–0.2)

## 2020-12-11 NOTE — Progress Notes (Signed)
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 Negative MPL515 mutation, Negative BCR/ABL and negative JAK-2 exon 12.  PRIOR THERAPY: None  CURRENT THERAPY: Phlebotomy on as-needed basis.  INTERVAL HISTORY: [name]. 60 y.o. male returns to the clinic today for follow-up visit.  The patient is feeling fine today with no concerning complaints except for fatigue.  He has been dealing with infection of the right knee replacement for several months and he is currently on chronic treatment with antibiotics under the care of Dr. Graylon Good.  He denied having any current chest pain, shortness of breath, cough or hemoptysis.  He denied having any fever or chills.  He has no nausea, vomiting, diarrhea or constipation.  He has no headache or visual changes.  He is here today for evaluation with repeat CBC and phlebotomy if needed.   MEDICAL HISTORY: Past Medical History:  Diagnosis Date  . Arthritis   . Diabetes mellitus without complication ([place])    TYPE 2  . Diverticulosis   . Dyslipidemia   . GERD (gastroesophageal reflux disease)   . Herpes labialis   . Hypertension   . Longstanding persistent atrial fibrillation ([place])   . Metabolic syndrome   . Obesity   . Proteinuria   . Psoriasis   . Seborrheic dermatitis   . Sleep apnea    very compliant with CPAP, PT NEEDS TO BRING OWN MACHINE  . TIA (transient ischemic attack) 2009    ALLERGIES:  is allergic to morphine and related.  MEDICATIONS:  Current Outpatient Medications  Medication Sig Dispense Refill  . acetaminophen (TYLENOL) 500 MG tablet Take 2 tablets (1,000 mg total) by mouth every 8 (eight) hours. 30 tablet 0  . carvedilol (COREG) 25 MG tablet TAKE 1 TABLET BY MOUTH 2 TIMES DAILY. (Patient taking  differently: Take 25 mg by mouth 2 (two) times daily with a meal.) 180 tablet 2  . cephALEXin (KEFLEX) 500 MG capsule TAKE 1 CAPSULE BY MOUTH 4 TIMES A DAY FOR 2 WEEKS (Patient not taking: Reported on 12/02/2020) 56 capsule 0  . clobetasol cream (TEMOVATE) 0.05 % APPLY TOPICALLY TO AFFECTED AREA TWICE DAILY. (Patient taking differently: Apply topically 2 (two) times daily. to affected area) 30 g 0  . doxycycline (VIBRA-TABS) 100 MG tablet Take 1 tablet (100 mg total) by mouth 2 (two) times daily. 180 tablet 3  . empagliflozin (JARDIANCE) 10 MG TABS tablet TAKE 1 TABLET BY MOUTH ONCE DAILY 90 tablet 0  . glucose blood test strip 1 each by Other route as needed. Use as instructed Freestyle test stripts 100 each 3  . glucose monitoring kit (FREESTYLE) monitoring kit 1 each by Does not apply route as needed. 1 each 0  . Lancets (FREESTYLE) lancets 1 each by Other route as needed. Use as instructed 100 each 3  . methocarbamol (ROBAXIN) 500 MG tablet TAKE 1 TABLET (500 MG TOTAL) BY MOUTH EVERY 6 (SIX) HOURS AS NEEDED FOR MUSCLE SPASMS. 40 tablet 0  . Multiple Vitamin (MULTIVITAMIN WITH MINERALS) TABS tablet Take 1 tablet by mouth daily.    . mupirocin ointment (BACTROBAN) 2 % mupirocin 2 % topical ointment  APPLY TOPICALLY TO THE AFFECTED AREA ON THE SKIN TWICE DAILY.    Marland Kitchen oxymetazoline (AFRIN) 0.05 % nasal spray Place 1 spray into both  nostrils at bedtime.    . pantoprazole (PROTONIX) 40 MG tablet TAKE 1 TABLET (40 MG TOTAL) BY MOUTH 2 (TWO) TIMES DAILY. (Patient taking differently: Take 40 mg by mouth every other day.) 270 tablet 3  . rivaroxaban (XARELTO) 20 MG TABS tablet TAKE 1 TABLET BY MOUTH DAILY WITH SUPPER. 90 tablet 1  . rosuvastatin (CRESTOR) 10 MG tablet TAKE 1 TABLET (10 MG TOTAL) BY MOUTH DAILY. 90 tablet 2  . sacubitril-valsartan (ENTRESTO) 24-26 MG TAKE 1 TABLET BY MOUTH 2 (TWO) TIMES DAILY. 180 tablet 3  . TALTZ 80 MG/ML SOAJ INJECT 1 MG BELOW THE SKIN AS DIRECTED; INJECT UNDER THE SKIN  EVERY FOUR WEEKS. 1 mL 11  . Vitamin D, Cholecalciferol, 1000 units CAPS Take 1,000 Units by mouth daily.      No current facility-administered medications for this visit.    SURGICAL HISTORY:  Past Surgical History:  Procedure Laterality Date  . CARDIOVERSION N/A 12/02/2016   Procedure: CARDIOVERSION;  Surgeon: Pixie Casino, MD;  Location: [facility] ENDOSCOPY;  Service: Cardiovascular;  Laterality: N/A;  . COLONOSCOPY    . EXCISIONAL TOTAL KNEE ARTHROPLASTY WITH ANTIBIOTIC SPACERS Right 03/28/2019   Procedure: EXCISIONAL TOTAL KNEE ARTHROPLASTY WITH ANTIBIOTIC SPACERS;  Surgeon: [name], MD;  Location: [facility]S;  Service: Orthopedics;  Laterality: Right;  90 mins  . I & D KNEE WITH POLY EXCHANGE Right 09/10/2018   Procedure: Right Knee Arthroplasty IRRIGATION AND DEBRIDEMENT KNEE WITH POLY EXCHANGE;  Surgeon: [name], MD;  Location: [facility];  Service: Orthopedics;  Laterality: Right;  . I & D KNEE WITH POLY EXCHANGE Right 06/25/2020   Procedure: Open excisional and non excisional debridement right knee, possible aspiration versus open arthrotomy poly exchange;  Surgeon: [name], MD;  Location: [facility];  Service: Orthopedics;  Laterality: Right;  . IRRIGATION AND DEBRIDEMENT KNEE Right 07/09/2020   Procedure: IRRIGATION AND DEBRIDEMENT KNEE;  Surgeon: [name], MD;  Location: [facility];  Service: Orthopedics;  Laterality: Right;  . JOINT REPLACEMENT  11/03/10   LT HIP  . PFO occluder cardiac valve  2006   Dr. Einar Gip, (hole in heart)  . REIMPLANTATION OF TOTAL KNEE Right 06/15/2019   Procedure: Resection of antibiotic spacer and  irrigation and debridment and placement of new antibiotic spacer and components;  Surgeon: [name], MD;  Location: [facility];  Service: Orthopedics;  Laterality: Right;  90 mins  . REIMPLANTATION OF TOTAL KNEE Right 08/22/2019   Procedure: REIMPLANTATION OF TOTAL KNEE;  Surgeon: [name], MD;  Location: [facility];  Service: Orthopedics;  Laterality: Right;   120 mins  . SKIN BIOPSY Left 12/22/2019   epidermal inclusion cyst  . TONSILLECTOMY AND ADENOIDECTOMY    . TOTAL HIP ARTHROPLASTY Left   . TOTAL KNEE ARTHROPLASTY Right 05/31/2018   Procedure: RIGHT TOTAL KNEE ARTHROPLASTY;  Surgeon: [name], MD;  Location: [facility];  Service: Orthopedics;  Laterality: Right;  70 mins  . TOTAL KNEE ARTHROPLASTY Left 06/28/2018   Procedure: LEFT TOTAL KNEE ARTHROPLASTY;  Surgeon: [name], MD;  Location: [facility];  Service: Orthopedics;  Laterality: Left;  70 mins  . WISDOM TOOTH EXTRACTION      REVIEW OF SYSTEMS:  A comprehensive review of systems was negative except for: Constitutional: positive for fatigue   PHYSICAL EXAMINATION: General appearance: alert, cooperative and no distress Head: Normocephalic, without obvious abnormality, atraumatic Neck: no adenopathy, no JVD, supple, symmetrical, trachea midline and thyroid not enlarged, symmetric, no tenderness/mass/nodules Lymph nodes: Cervical, supraclavicular, and axillary  nodes normal. Resp: clear to auscultation bilaterally Back: symmetric, no curvature. ROM normal. No CVA tenderness. Cardio: regular rate and rhythm, S1, S2 normal, no murmur, click, rub or gallop GI: soft, non-tender; bowel sounds normal; no masses,  no organomegaly Extremities: extremities normal, atraumatic, no cyanosis or edema  ECOG PERFORMANCE STATUS: 1 - Symptomatic but completely ambulatory  Blood pressure (!) 153/96, pulse 79, temperature (!) 97.1 F (36.2 C), temperature source Tympanic, resp. rate 20, height 5' 10"  (1.778 m), weight 285 lb 8 oz (129.5 kg), SpO2 98 %.  LABORATORY DATA: Lab Results  Component Value Date   WBC 6.6 12/11/2020   HGB 19.6 (H) 12/11/2020   HCT 54.4 (H) 12/11/2020   MCV 86.3 12/11/2020   PLT 163 12/11/2020      Chemistry      Component Value Date/Time   NA 138 09/03/2020 1620   NA 141 05/27/2020 0916   NA 138 05/22/2015 1303   K 4.3 09/03/2020 1620   K 4.1 05/22/2015 1303    CL 102 09/03/2020 1620   CO2 26 09/03/2020 1620   CO2 23 05/22/2015 1303   BUN 23 09/03/2020 1620   BUN 22 05/27/2020 0916   BUN 17.4 05/22/2015 1303   CREATININE 1.18 09/03/2020 1620   CREATININE 1.1 05/22/2015 1303      Component Value Date/Time   CALCIUM 9.6 09/03/2020 1620   CALCIUM 9.4 05/22/2015 1303   ALKPHOS 59 05/27/2020 0916   ALKPHOS 73 05/22/2015 1303   AST 23 05/27/2020 0916   AST 24 05/22/2015 1303   ALT 33 05/27/2020 0916   ALT 30 05/22/2015 1303   BILITOT 1.1 05/27/2020 0916   BILITOT 0.77 05/22/2015 1303       RADIOGRAPHIC STUDIES: No results found.  ASSESSMENT AND PLAN: This is a very pleasant 60 years old white male with polycythemia highly suspicious for polycythemia vera but with negative JAK mutation.  The patient is currently on phlebotomy on as-needed basis. CBC today showed hemoglobin of 19.6 and hematocrit 54.4%. I recommended for the patient to proceed with phlebotomy today as planned. I will see him back for follow-up visit in 2 months for evaluation with repeat blood work and phlebotomy if needed. He was advised to call immediately if he has any concerning symptoms in the interval. The patient voices understanding of current disease status and treatment options and is in agreement with the current care plan.  All questions were answered. The patient knows to call the clinic with any problems, questions or concerns. We can certainly see the patient much sooner if necessary.   Disclaimer: This note was dictated with voice recognition software. Similar sounding words can inadvertently be transcribed and may not be corrected upon review.

## 2020-12-12 ENCOUNTER — Telehealth: Payer: Self-pay | Admitting: Internal Medicine

## 2020-12-12 NOTE — Telephone Encounter (Signed)
Scheduled per los. Called and spoke with patient. Confirmed appts  

## 2020-12-19 ENCOUNTER — Other Ambulatory Visit (HOSPITAL_COMMUNITY): Payer: Self-pay

## 2020-12-19 MED FILL — Ixekizumab Subcutaneous Soln Auto-injector 80 MG/ML: SUBCUTANEOUS | 28 days supply | Qty: 1 | Fill #0 | Status: AC

## 2020-12-19 MED FILL — Sacubitril-Valsartan Tab 24-26 MG: ORAL | 90 days supply | Qty: 180 | Fill #0 | Status: AC

## 2020-12-19 MED FILL — Empagliflozin Tab 10 MG: ORAL | 90 days supply | Qty: 90 | Fill #0 | Status: CN

## 2020-12-19 MED FILL — Empagliflozin Tab 10 MG: ORAL | 90 days supply | Qty: 90 | Fill #0 | Status: AC

## 2020-12-20 ENCOUNTER — Other Ambulatory Visit (HOSPITAL_COMMUNITY): Payer: Self-pay

## 2020-12-23 ENCOUNTER — Other Ambulatory Visit (HOSPITAL_COMMUNITY): Payer: Self-pay

## 2020-12-25 ENCOUNTER — Other Ambulatory Visit (HOSPITAL_COMMUNITY): Payer: Self-pay

## 2021-01-13 ENCOUNTER — Other Ambulatory Visit: Payer: Self-pay | Admitting: Pharmacist

## 2021-01-13 ENCOUNTER — Other Ambulatory Visit (HOSPITAL_COMMUNITY): Payer: Self-pay

## 2021-01-13 MED ORDER — TALTZ 80 MG/ML ~~LOC~~ SOAJ
SUBCUTANEOUS | 8 refills | Status: DC
  Filled 2021-01-13: qty 1, 28d supply, fill #0
  Filled 2021-02-05: qty 1, 28d supply, fill #1
  Filled 2021-03-04: qty 1, 28d supply, fill #2
  Filled 2021-04-01: qty 1, 28d supply, fill #3
  Filled 2021-04-28: qty 1, 28d supply, fill #4
  Filled 2021-05-23: qty 1, 28d supply, fill #5
  Filled 2021-06-19: qty 1, 28d supply, fill #6
  Filled 2021-07-15 – 2021-08-11 (×4): qty 1, 28d supply, fill #7
  Filled 2021-09-02: qty 1, 28d supply, fill #8

## 2021-01-13 MED FILL — Ixekizumab Subcutaneous Soln Auto-injector 80 MG/ML: SUBCUTANEOUS | 28 days supply | Qty: 1 | Fill #1 | Status: CN

## 2021-01-20 ENCOUNTER — Other Ambulatory Visit (HOSPITAL_COMMUNITY): Payer: Self-pay

## 2021-01-20 MED FILL — Rosuvastatin Calcium Tab 10 MG: ORAL | 90 days supply | Qty: 90 | Fill #0 | Status: AC

## 2021-01-22 ENCOUNTER — Ambulatory Visit: Payer: 59 | Attending: Internal Medicine

## 2021-01-22 DIAGNOSIS — Z20822 Contact with and (suspected) exposure to covid-19: Secondary | ICD-10-CM | POA: Diagnosis not present

## 2021-01-23 ENCOUNTER — Other Ambulatory Visit: Payer: Self-pay | Admitting: Internal Medicine

## 2021-01-23 ENCOUNTER — Other Ambulatory Visit: Payer: Self-pay | Admitting: Family Medicine

## 2021-01-23 ENCOUNTER — Encounter: Payer: Self-pay | Admitting: Family Medicine

## 2021-01-23 DIAGNOSIS — K219 Gastro-esophageal reflux disease without esophagitis: Secondary | ICD-10-CM

## 2021-01-23 LAB — SARS-COV-2, NAA 2 DAY TAT

## 2021-01-23 LAB — NOVEL CORONAVIRUS, NAA: SARS-CoV-2, NAA: DETECTED — AB

## 2021-01-23 MED ORDER — PANTOPRAZOLE SODIUM 40 MG PO TBEC
40.0000 mg | DELAYED_RELEASE_TABLET | Freq: Two times a day (BID) | ORAL | 3 refills | Status: DC
  Filled 2021-01-23: qty 180, 90d supply, fill #0

## 2021-01-23 NOTE — Telephone Encounter (Signed)
Is this okay to refill? 

## 2021-01-24 ENCOUNTER — Telehealth: Payer: Self-pay

## 2021-01-24 ENCOUNTER — Other Ambulatory Visit (HOSPITAL_COMMUNITY): Payer: Self-pay

## 2021-01-24 ENCOUNTER — Other Ambulatory Visit: Payer: Self-pay | Admitting: Internal Medicine

## 2021-01-24 NOTE — Telephone Encounter (Signed)
Called to discuss with patient about COVID-19 symptoms and the use of one of the available treatments for those with mild to moderate Covid symptoms and at a high risk of hospitalization.  Pt appears to qualify for outpatient treatment due to co-morbid conditions and/or a member of an at-risk group in accordance with the FDA Emergency Use Authorization.    Symptom onset:  Vaccinated: Yes Booster? Yes Immunocompromised? No Qualifiers: CHF,HTN NIH Criteria: Tier 1 Pt. States he is better. Decline any treatment.  [name]

## 2021-02-03 ENCOUNTER — Telehealth: Payer: Self-pay | Admitting: Internal Medicine

## 2021-02-03 NOTE — Telephone Encounter (Signed)
Scheduled appointment per 06/06 sch msg. Left message.

## 2021-02-04 ENCOUNTER — Inpatient Hospital Stay: Payer: 59 | Attending: Internal Medicine | Admitting: Internal Medicine

## 2021-02-04 ENCOUNTER — Inpatient Hospital Stay: Payer: 59

## 2021-02-04 ENCOUNTER — Other Ambulatory Visit: Payer: Self-pay

## 2021-02-04 VITALS — BP 102/64 | HR 62 | Temp 97.1°F | Resp 20 | Ht 70.0 in | Wt 272.7 lb

## 2021-02-04 DIAGNOSIS — D45 Polycythemia vera: Secondary | ICD-10-CM

## 2021-02-04 DIAGNOSIS — D751 Secondary polycythemia: Secondary | ICD-10-CM | POA: Diagnosis not present

## 2021-02-04 DIAGNOSIS — Z8616 Personal history of COVID-19: Secondary | ICD-10-CM | POA: Insufficient documentation

## 2021-02-04 LAB — CBC WITH DIFFERENTIAL (CANCER CENTER ONLY)
Abs Immature Granulocytes: 0.03 10*3/uL (ref 0.00–0.07)
Basophils Absolute: 0 10*3/uL (ref 0.0–0.1)
Basophils Relative: 1 %
Eosinophils Absolute: 0.1 10*3/uL (ref 0.0–0.5)
Eosinophils Relative: 2 %
HCT: 55.1 % — ABNORMAL HIGH (ref 39.0–52.0)
Hemoglobin: 19.7 g/dL — ABNORMAL HIGH (ref 13.0–17.0)
Immature Granulocytes: 1 %
Lymphocytes Relative: 30 %
Lymphs Abs: 1.9 10*3/uL (ref 0.7–4.0)
MCH: 31.2 pg (ref 26.0–34.0)
MCHC: 35.8 g/dL (ref 30.0–36.0)
MCV: 87.2 fL (ref 80.0–100.0)
Monocytes Absolute: 0.6 10*3/uL (ref 0.1–1.0)
Monocytes Relative: 9 %
Neutro Abs: 3.7 10*3/uL (ref 1.7–7.7)
Neutrophils Relative %: 57 %
Platelet Count: 174 10*3/uL (ref 150–400)
RBC: 6.32 MIL/uL — ABNORMAL HIGH (ref 4.22–5.81)
RDW: 13.5 % (ref 11.5–15.5)
WBC Count: 6.4 10*3/uL (ref 4.0–10.5)
nRBC: 0 % (ref 0.0–0.2)

## 2021-02-04 NOTE — Progress Notes (Signed)
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 Negative MPL515 mutation, Negative BCR/ABL and negative JAK-2 exon 12.  PRIOR THERAPY: None  CURRENT THERAPY: Phlebotomy on as-needed basis.  INTERVAL HISTORY: [name]. 60 y.o. male returns to the clinic today for follow-up visit.  The patient was diagnosed with COVID-19 2 weeks ago.  He is feeling a little bit better now.  He did not receive his second COVID dose at that time.  He denied having any current chest pain, shortness of breath, cough or hemoptysis.  He denied having any fever or chills.  He has no nausea, vomiting, diarrhea or constipation.  He has no headache or visual changes.  He is here today for evaluation and repeat blood work.   MEDICAL HISTORY: Past Medical History:  Diagnosis Date  . Arthritis   . Diabetes mellitus without complication ([place])    TYPE 2  . Diverticulosis   . Dyslipidemia   . GERD (gastroesophageal reflux disease)   . Herpes labialis   . Hypertension   . Longstanding persistent atrial fibrillation ([place])   . Metabolic syndrome   . Obesity   . Proteinuria   . Psoriasis   . Seborrheic dermatitis   . Sleep apnea    very compliant with CPAP, PT NEEDS TO BRING OWN MACHINE  . TIA (transient ischemic attack) 2009    ALLERGIES:  is allergic to morphine and related.  MEDICATIONS:  Current Outpatient Medications  Medication Sig Dispense Refill  . acetaminophen (TYLENOL) 500 MG tablet Take 2 tablets (1,000 mg total) by mouth every 8 (eight) hours. (Patient not taking: Reported on 12/11/2020) 30 tablet 0  . carvedilol (COREG) 25 MG tablet TAKE 1 TABLET BY MOUTH 2 TIMES DAILY. (Patient taking differently: Take 25 mg by mouth 2 (two) times daily with a meal.) 180 tablet 2  .  clobetasol cream (TEMOVATE) 0.05 % APPLY TOPICALLY TO AFFECTED AREA TWICE DAILY. (Patient taking differently: Apply topically 2 (two) times daily. to affected area) 30 g 0  . doxycycline (VIBRA-TABS) 100 MG tablet Take 1 tablet (100 mg total) by mouth 2 (two) times daily. 180 tablet 3  . empagliflozin (JARDIANCE) 10 MG TABS tablet TAKE 1 TABLET BY MOUTH ONCE DAILY 90 tablet 0  . glucose blood test strip 1 each by Other route as needed. Use as instructed Freestyle test stripts 100 each 3  . glucose monitoring kit (FREESTYLE) monitoring kit 1 each by Does not apply route as needed. 1 each 0  . Lancets (FREESTYLE) lancets 1 each by Other route as needed. Use as instructed 100 each 3  . Multiple Vitamin (MULTIVITAMIN WITH MINERALS) TABS tablet Take 1 tablet by mouth daily.    . mupirocin ointment (BACTROBAN) 2 % mupirocin 2 % topical ointment  APPLY TOPICALLY TO THE AFFECTED AREA ON THE SKIN TWICE DAILY.    Marland Kitchen oxymetazoline (AFRIN) 0.05 % nasal spray Place 1 spray into both nostrils at bedtime.    . pantoprazole (PROTONIX) 40 MG tablet Take 1 tablet (40 mg total) by mouth 2 (two) times daily. 270 tablet 3  . rivaroxaban (XARELTO) 20 MG TABS tablet TAKE 1 TABLET BY MOUTH DAILY WITH SUPPER. 90 tablet 1  . rosuvastatin (CRESTOR) 10 MG tablet TAKE 1 TABLET (10 MG TOTAL) BY MOUTH DAILY. 90 tablet 2  .  sacubitril-valsartan (ENTRESTO) 24-26 MG TAKE 1 TABLET BY MOUTH 2 (TWO) TIMES DAILY. 180 tablet 3  . TALTZ 80 MG/ML SOAJ Inject 1 ml (6m) below the skin as directed; inject under the skin every 4 weeks. 1 mL 8  . Vitamin D, Cholecalciferol, 1000 units CAPS Take 1,000 Units by mouth daily.      No current facility-administered medications for this visit.    SURGICAL HISTORY:  Past Surgical History:  Procedure Laterality Date  . CARDIOVERSION N/A 12/02/2016   Procedure: CARDIOVERSION;  Surgeon: KPixie Casino MD;  [facility];  Service: Cardiovascular;  Laterality: N/A;  . COLONOSCOPY    .  EXCISIONAL TOTAL KNEE ARTHROPLASTY WITH ANTIBIOTIC SPACERS Right 03/28/2019   Procedure: EXCISIONAL TOTAL KNEE ARTHROPLASTY WITH ANTIBIOTIC SPACERS;  Surgeon: OParalee Cancel MD;  Location: [facility];  Service: Orthopedics;  Laterality: Right;  90 mins  . I & D KNEE WITH POLY EXCHANGE Right 09/10/2018   Procedure: Right Knee Arthroplasty IRRIGATION AND DEBRIDEMENT KNEE WITH POLY EXCHANGE;  Surgeon: OParalee Cancel MD;  Location: [facility];  Service: Orthopedics;  Laterality: Right;  . I & D KNEE WITH POLY EXCHANGE Right 06/25/2020   Procedure: Open excisional and non excisional debridement right knee, possible aspiration versus open arthrotomy poly exchange;  Surgeon: OParalee Cancel MD;  Location: [facility];  Service: Orthopedics;  Laterality: Right;  . IRRIGATION AND DEBRIDEMENT KNEE Right 07/09/2020   Procedure: IRRIGATION AND DEBRIDEMENT KNEE;  Surgeon: OParalee Cancel MD;  Location: [facility];  Service: Orthopedics;  Laterality: Right;  . JOINT REPLACEMENT  11/03/10   LT HIP  . PFO occluder cardiac valve  2006   Dr. GEinar Gip (hole in heart)  . REIMPLANTATION OF TOTAL KNEE Right 06/15/2019   Procedure: Resection of antibiotic spacer and  irrigation and debridment and placement of new antibiotic spacer and components;  Surgeon: OParalee Cancel MD;  Location: [facility];  Service: Orthopedics;  Laterality: Right;  90 mins  . REIMPLANTATION OF TOTAL KNEE Right 08/22/2019   Procedure: REIMPLANTATION OF TOTAL KNEE;  Surgeon: OParalee Cancel MD;  [facility]: [facility];  Service: Orthopedics;  Laterality: Right;  120 mins  . SKIN BIOPSY Left 12/22/2019   epidermal inclusion cyst  . TONSILLECTOMY AND ADENOIDECTOMY    . TOTAL HIP ARTHROPLASTY Left   . TOTAL KNEE ARTHROPLASTY Right 05/31/2018   Procedure: RIGHT TOTAL KNEE ARTHROPLASTY;  Surgeon: OParalee Cancel MD;  [facility]: [facility];  Service: Orthopedics;  Laterality: Right;  70 mins  . TOTAL KNEE ARTHROPLASTY Left 06/28/2018   Procedure: LEFT TOTAL KNEE ARTHROPLASTY;  Surgeon: OParalee Cancel MD;  [facility];  Service: Orthopedics;  Laterality: Left;  70 mins  . WISDOM TOOTH EXTRACTION      REVIEW OF SYSTEMS:  A comprehensive review of systems was negative.   PHYSICAL EXAMINATION: General appearance: alert, cooperative and no distress Head: Normocephalic, without obvious abnormality, atraumatic Neck: no adenopathy, no JVD, supple, symmetrical, trachea midline and thyroid not enlarged, symmetric, no tenderness/mass/nodules Lymph nodes: Cervical, supraclavicular, and axillary nodes normal. Resp: clear to auscultation bilaterally Back: symmetric, no curvature. ROM normal. No CVA tenderness. Cardio: regular rate and rhythm, S1, S2 normal, no murmur, click, rub or gallop GI: soft, non-tender; bowel sounds normal; no masses,  no organomegaly Extremities: extremities normal, atraumatic, no cyanosis or edema  ECOG PERFORMANCE STATUS: 1 - Symptomatic but completely ambulatory  Blood pressure 102/64, pulse 62, temperature (!) 97.1 F (36.2 C), temperature source Tympanic, resp. rate 20, height 5' 10"  (1.778 m), weight  272 lb 11.2 oz (123.7 kg), SpO2 97 %.  LABORATORY DATA: Lab Results  Component Value Date   WBC 6.4 02/04/2021   HGB 19.7 (H) 02/04/2021   HCT 55.1 (H) 02/04/2021   MCV 87.2 02/04/2021   PLT 174 02/04/2021      Chemistry      Component Value Date/Time   NA 138 09/03/2020 1620   NA 141 05/27/2020 0916   NA 138 05/22/2015 1303   K 4.3 09/03/2020 1620   K 4.1 05/22/2015 1303   CL 102 09/03/2020 1620   CO2 26 09/03/2020 1620   CO2 23 05/22/2015 1303   BUN 23 09/03/2020 1620   BUN 22 05/27/2020 0916   BUN 17.4 05/22/2015 1303   CREATININE 1.18 09/03/2020 1620   CREATININE 1.1 05/22/2015 1303      Component Value Date/Time   CALCIUM 9.6 09/03/2020 1620   CALCIUM 9.4 05/22/2015 1303   ALKPHOS 59 05/27/2020 0916   ALKPHOS 73 05/22/2015 1303   AST 23 05/27/2020 0916   AST 24 05/22/2015 1303   ALT 33 05/27/2020 0916   ALT 30 05/22/2015 1303    BILITOT 1.1 05/27/2020 0916   BILITOT 0.77 05/22/2015 1303       RADIOGRAPHIC STUDIES: No results found.  ASSESSMENT AND PLAN: This is a very pleasant 60 years old white male with polycythemia highly suspicious for polycythemia vera but with negative JAK mutation.  He has been receiving phlebotomy on as-needed basis to keep his hematocrit between 45-50%. CBC today showed hematocrit of 55.1%. I will arrange for the patient to have phlebotomy next week giving him 1 more extra week to recover from the COVID 19 before sending him to the chemotherapy room. I will also arrange for the patient to have phlebotomy on a monthly basis until I see him back for follow-up visit in 2 months with repeat blood work. He was advised to call immediately if he has any other concerning symptoms in the interval. The patient voices understanding of current disease status and treatment options and is in agreement with the current care plan.  All questions were answered. The patient knows to call the clinic with any problems, questions or concerns. We can certainly see the patient much sooner if necessary.   Disclaimer: This note was dictated with voice recognition software. Similar sounding words can inadvertently be transcribed and may not be corrected upon review.

## 2021-02-05 ENCOUNTER — Other Ambulatory Visit: Payer: Self-pay | Admitting: Internal Medicine

## 2021-02-05 ENCOUNTER — Other Ambulatory Visit (HOSPITAL_COMMUNITY): Payer: Self-pay

## 2021-02-07 ENCOUNTER — Other Ambulatory Visit (HOSPITAL_COMMUNITY): Payer: Self-pay

## 2021-02-07 ENCOUNTER — Other Ambulatory Visit: Payer: Self-pay | Admitting: Internal Medicine

## 2021-02-11 ENCOUNTER — Inpatient Hospital Stay: Payer: 59

## 2021-02-11 ENCOUNTER — Ambulatory Visit: Payer: 59

## 2021-02-11 ENCOUNTER — Other Ambulatory Visit: Payer: Self-pay

## 2021-02-11 VITALS — BP 109/64 | HR 84 | Temp 98.3°F | Resp 18

## 2021-02-11 DIAGNOSIS — D45 Polycythemia vera: Secondary | ICD-10-CM

## 2021-02-11 DIAGNOSIS — Z8616 Personal history of COVID-19: Secondary | ICD-10-CM | POA: Diagnosis not present

## 2021-02-11 DIAGNOSIS — D751 Secondary polycythemia: Secondary | ICD-10-CM | POA: Diagnosis not present

## 2021-02-11 LAB — CBC WITH DIFFERENTIAL (CANCER CENTER ONLY)
Abs Immature Granulocytes: 0.04 10*3/uL (ref 0.00–0.07)
Basophils Absolute: 0 10*3/uL (ref 0.0–0.1)
Basophils Relative: 0 %
Eosinophils Absolute: 0.1 10*3/uL (ref 0.0–0.5)
Eosinophils Relative: 2 %
HCT: 53.8 % — ABNORMAL HIGH (ref 39.0–52.0)
Hemoglobin: 19.5 g/dL — ABNORMAL HIGH (ref 13.0–17.0)
Immature Granulocytes: 1 %
Lymphocytes Relative: 26 %
Lymphs Abs: 1.8 10*3/uL (ref 0.7–4.0)
MCH: 31.8 pg (ref 26.0–34.0)
MCHC: 36.2 g/dL — ABNORMAL HIGH (ref 30.0–36.0)
MCV: 87.8 fL (ref 80.0–100.0)
Monocytes Absolute: 0.6 10*3/uL (ref 0.1–1.0)
Monocytes Relative: 9 %
Neutro Abs: 4.3 10*3/uL (ref 1.7–7.7)
Neutrophils Relative %: 62 %
Platelet Count: 148 10*3/uL — ABNORMAL LOW (ref 150–400)
RBC: 6.13 MIL/uL — ABNORMAL HIGH (ref 4.22–5.81)
RDW: 13.8 % (ref 11.5–15.5)
WBC Count: 6.9 10*3/uL (ref 4.0–10.5)
nRBC: 0 % (ref 0.0–0.2)

## 2021-02-11 NOTE — Progress Notes (Signed)
[facility] presents to infusion clinic for therapeutic phlebotomy per MD orders. Procedure began at 3552 using 18g IV to R anterior forearm. Phlebotomy ended at 1405 with a total of 530g collected. Prior to procedure, patient was slightly hypotensive compared to baseline at 109/64. Patient denied dizziness or any falls related to this. Cassie, PA, made aware and advised to proceed with phlebotomy. Vitals stable upon discharge, patient denied dizziness or lightheadedness.

## 2021-02-11 NOTE — Patient Instructions (Signed)

## 2021-02-13 ENCOUNTER — Other Ambulatory Visit (HOSPITAL_COMMUNITY): Payer: Self-pay

## 2021-02-13 ENCOUNTER — Other Ambulatory Visit: Payer: Self-pay | Admitting: Internal Medicine

## 2021-02-13 MED ORDER — CARVEDILOL 25 MG PO TABS
25.0000 mg | ORAL_TABLET | Freq: Two times a day (BID) | ORAL | 0 refills | Status: DC
  Filled 2021-02-13: qty 180, 90d supply, fill #0

## 2021-02-14 ENCOUNTER — Other Ambulatory Visit (HOSPITAL_COMMUNITY): Payer: Self-pay

## 2021-02-14 MED ORDER — CARESTART COVID-19 HOME TEST VI KIT
PACK | 0 refills | Status: DC
  Filled 2021-02-14: qty 4, 4d supply, fill #0

## 2021-02-17 ENCOUNTER — Other Ambulatory Visit (HOSPITAL_COMMUNITY): Payer: Self-pay

## 2021-02-27 ENCOUNTER — Encounter: Payer: Self-pay | Admitting: Internal Medicine

## 2021-02-27 ENCOUNTER — Other Ambulatory Visit (HOSPITAL_COMMUNITY): Payer: Self-pay

## 2021-02-27 MED FILL — Rivaroxaban Tab 20 MG: ORAL | 90 days supply | Qty: 90 | Fill #0 | Status: AC

## 2021-02-28 ENCOUNTER — Telehealth: Payer: Self-pay | Admitting: Pharmacist

## 2021-02-28 NOTE — Telephone Encounter (Signed)
Called patient to schedule an appointment for the [employer] [facility]. I was unable to reach the patient so I left a HIPAA-compliant message requesting that the patient return my call.   Luke Van Ausdall, PharmD, BCACP, CPP Clinical Pharmacist [facility] [phone]  

## 2021-03-04 ENCOUNTER — Other Ambulatory Visit (HOSPITAL_COMMUNITY): Payer: Self-pay

## 2021-03-05 ENCOUNTER — Ambulatory Visit: Payer: 59 | Admitting: Internal Medicine

## 2021-03-05 ENCOUNTER — Ambulatory Visit: Payer: 59 | Attending: Family Medicine | Admitting: Pharmacist

## 2021-03-05 ENCOUNTER — Other Ambulatory Visit: Payer: Self-pay

## 2021-03-05 ENCOUNTER — Encounter: Payer: Self-pay | Admitting: Internal Medicine

## 2021-03-05 VITALS — BP 116/74 | Temp 98.0°F | Wt 264.0 lb

## 2021-03-05 DIAGNOSIS — Z96659 Presence of unspecified artificial knee joint: Secondary | ICD-10-CM

## 2021-03-05 DIAGNOSIS — T8459XD Infection and inflammatory reaction due to other internal joint prosthesis, subsequent encounter: Secondary | ICD-10-CM

## 2021-03-05 DIAGNOSIS — Z79899 Other long term (current) drug therapy: Secondary | ICD-10-CM

## 2021-03-05 NOTE — Progress Notes (Signed)
RFV: MSSa prosthetic joint infectoin Patient ID: William Sill., male   DOB: 22-Jan-1961, 60 y.o.   MRN: [id number]  HPI - intentional weight loss and improved exercise  Had covid 7 weeks ago;  Only had 1 booster  Right knee - 8 months out from surgery. -feeling better. Left knee - normal  Outpatient Encounter Medications as of 03/05/2021  Medication Sig   carvedilol (COREG) 25 MG tablet Take 1 tablet (25 mg total) by mouth 2 (two) times daily. Please Schedule appt for future refills.   clobetasol cream (TEMOVATE) 0.05 % APPLY TOPICALLY TO AFFECTED AREA TWICE DAILY.   COVID-19 At Home Antigen Test [facility] COVID-19 HOME TEST) KIT Use as directed   doxycycline (VIBRA-TABS) 100 MG tablet Take 1 tablet (100 mg total) by mouth 2 (two) times daily.   empagliflozin (JARDIANCE) 10 MG TABS tablet TAKE 1 TABLET BY MOUTH ONCE DAILY   glucose blood test strip 1 each by Other route as needed. Use as instructed Freestyle test stripts   glucose monitoring kit (FREESTYLE) monitoring kit 1 each by Does not apply route as needed.   Lancets (FREESTYLE) lancets 1 each by Other route as needed. Use as instructed   Multiple Vitamin (MULTIVITAMIN WITH MINERALS) TABS tablet Take 1 tablet by mouth daily.   mupirocin ointment (BACTROBAN) 2 % mupirocin 2 % topical ointment  APPLY TOPICALLY TO THE AFFECTED AREA ON THE SKIN TWICE DAILY.   oxymetazoline (AFRIN) 0.05 % nasal spray Place 1 spray into both nostrils at bedtime.   pantoprazole (PROTONIX) 40 MG tablet Take 1 tablet (40 mg total) by mouth 2 (two) times daily.   rivaroxaban (XARELTO) 20 MG TABS tablet TAKE 1 TABLET BY MOUTH DAILY WITH SUPPER.   rosuvastatin (CRESTOR) 10 MG tablet TAKE 1 TABLET (10 MG TOTAL) BY MOUTH DAILY.   sacubitril-valsartan (ENTRESTO) 24-26 MG TAKE 1 TABLET BY MOUTH 2 (TWO) TIMES DAILY.   TALTZ 80 MG/ML SOAJ Inject 1 ml (80mg ) under the skin every 4 weeks.   Vitamin D, Cholecalciferol, 1000 units CAPS Take 1,000 Units by  mouth daily.    acetaminophen (TYLENOL) 500 MG tablet Take 2 tablets (1,000 mg total) by mouth every 8 (eight) hours. (Patient not taking: Reported on 12/11/2020)   No facility-administered encounter medications on file as of 03/05/2021.     Patient Active Problem List   Diagnosis Date Noted   Infection of prosthetic right knee joint (HCC) 06/25/2020   Chronic systolic CHF (congestive heart failure) (HCC) 09/27/2019   High risk medication use 11/08/2018   Infection of prosthetic knee joint (HCC) 09/10/2018   S/P left TKA 06/28/2018   S/P TKR (total knee replacement), right 05/31/2018   Tubular adenoma of colon 10/18/2017   BCE (basal cell epithelioma), face 06/21/2017   Diabetic nephropathy associated with type 2 diabetes mellitus (HCC) 10/13/2016   Permanent atrial fibrillation (HCC) 10/13/2016   Health examination of defined subpopulation 11/15/2015   Polycythemia vera (HCC) 10/09/2014   Hyperlipidemia associated with type 2 diabetes mellitus (HCC) 09/17/2014   Hypertension associated with diabetes (HCC) 09/17/2014   Diabetes mellitus (HCC) 10/31/2013   Hx-TIA (transient ischemic attack) 09/05/2012   GERD (gastroesophageal reflux disease) 08/17/2011   Obesity (BMI 30-39.9) 08/17/2011   Psoriatic arthritis (HCC) 08/17/2011   Obstructive sleep apnea 08/05/2009     Health Maintenance Due  Topic Date Due   Pneumococcal Vaccine 10-74 Years old (1 - PCV) Never done   FOOT EXAM  10/21/2019   COVID-19 Vaccine (4 -  Booster for [employer] series) 08/26/2020     Review of Systems  Physical Exam   Temp 98 F (36.7 C) (Oral)   Wt 264 lb (119.7 kg)   BMI 37.88 kg/m  / BP 117/72  No results found for: CD4TCELL No results found for: CD4TABS No results found for: HIV1RNAQUANT No results found for: HEPBSAB No results found for: RPR, LABRPR  CBC Lab Results  Component Value Date   WBC 6.9 02/11/2021   RBC 6.13 (H) 02/11/2021   HGB 19.5 (H) 02/11/2021   HCT 53.8 (H) 02/11/2021    PLT 148 (L) 02/11/2021   MCV 87.8 02/11/2021   MCH 31.8 02/11/2021   MCHC 36.2 (H) 02/11/2021   RDW 13.8 02/11/2021   LYMPHSABS 1.8 02/11/2021   MONOABS 0.6 02/11/2021   EOSABS 0.1 02/11/2021    BMET Lab Results  Component Value Date   NA 138 09/03/2020   K 4.3 09/03/2020   CL 102 09/03/2020   CO2 26 09/03/2020   GLUCOSE 164 (H) 09/03/2020   BUN 23 09/03/2020   CREATININE 1.18 09/03/2020   CALCIUM 9.6 09/03/2020   GFRNONAA >60 07/09/2020   GFRAA 74 05/27/2020      Assessment and Plan   Hx of MSSA PJI = - continue with doxy. If intolerant, can switch to cephalopsorin - will check sed rate and crp

## 2021-03-05 NOTE — Progress Notes (Signed)
S: Patient presents today for review of his specialty medication.  Patient is currently taking Taltz for psoriatric arthritis. Patient is managed by Dr. Elvera Lennox for this.  Adherence: denies any missed doses.  Efficacy: reports that it is still working very well for him. He has been back on it for ~1 year now.   Dosing:  SubQ:  80 mg every 4 weeks.  Drug-drug interactions: none  Screening: TB test: completed yearly, denies any positive test results Hepatitis: completed  Monitoring: S/sx of infection: none CBC: see below S/sx of hypersensitivity: denies S/sx of malignancy: denies S/sx of heart failure: denies  O:  Lab Results  Component Value Date   WBC 6.9 02/11/2021   HGB 19.5 (H) 02/11/2021   HCT 53.8 (H) 02/11/2021   MCV 87.8 02/11/2021   PLT 148 (L) 02/11/2021      Chemistry      Component Value Date/Time   NA 138 09/03/2020 1620   NA 141 05/27/2020 0916   NA 138 05/22/2015 1303   K 4.3 09/03/2020 1620   K 4.1 05/22/2015 1303   CL 102 09/03/2020 1620   CO2 26 09/03/2020 1620   CO2 23 05/22/2015 1303   BUN 23 09/03/2020 1620   BUN 22 05/27/2020 0916   BUN 17.4 05/22/2015 1303   CREATININE 1.18 09/03/2020 1620   CREATININE 1.1 05/22/2015 1303      Component Value Date/Time   CALCIUM 9.6 09/03/2020 1620   CALCIUM 9.4 05/22/2015 1303   ALKPHOS 59 05/27/2020 0916   ALKPHOS 73 05/22/2015 1303   AST 23 05/27/2020 0916   AST 24 05/22/2015 1303   ALT 33 05/27/2020 0916   ALT 30 05/22/2015 1303   BILITOT 1.1 05/27/2020 0916   BILITOT 0.77 05/22/2015 1303       A/P: 1. Medication review: patient is on Taltz and tolerating it well without any adverse effects. Donnetta Hail was restarted last year and the patient has been doing well since.  Reviewed the medication, including injection technique, adverse effects, and efficacy. No recommendations for changes.   Benard Halsted, PharmD, [place], [place] [phone]

## 2021-03-07 ENCOUNTER — Encounter: Payer: Self-pay | Admitting: Internal Medicine

## 2021-03-07 ENCOUNTER — Other Ambulatory Visit (HOSPITAL_COMMUNITY): Payer: Self-pay

## 2021-03-11 ENCOUNTER — Inpatient Hospital Stay: Payer: 59

## 2021-03-11 ENCOUNTER — Other Ambulatory Visit (HOSPITAL_COMMUNITY): Payer: Self-pay

## 2021-03-11 ENCOUNTER — Other Ambulatory Visit: Payer: Self-pay

## 2021-03-11 ENCOUNTER — Inpatient Hospital Stay: Payer: 59 | Attending: Internal Medicine

## 2021-03-11 VITALS — BP 122/70 | HR 72 | Temp 98.2°F | Resp 18

## 2021-03-11 DIAGNOSIS — D45 Polycythemia vera: Secondary | ICD-10-CM

## 2021-03-11 DIAGNOSIS — D751 Secondary polycythemia: Secondary | ICD-10-CM | POA: Diagnosis not present

## 2021-03-11 LAB — CBC WITH DIFFERENTIAL (CANCER CENTER ONLY)
Abs Immature Granulocytes: 0.02 10*3/uL (ref 0.00–0.07)
Basophils Absolute: 0 10*3/uL (ref 0.0–0.1)
Basophils Relative: 0 %
Eosinophils Absolute: 0.1 10*3/uL (ref 0.0–0.5)
Eosinophils Relative: 2 %
HCT: 54.7 % — ABNORMAL HIGH (ref 39.0–52.0)
Hemoglobin: 19.8 g/dL — ABNORMAL HIGH (ref 13.0–17.0)
Immature Granulocytes: 0 %
Lymphocytes Relative: 32 %
Lymphs Abs: 1.9 10*3/uL (ref 0.7–4.0)
MCH: 32 pg (ref 26.0–34.0)
MCHC: 36.2 g/dL — ABNORMAL HIGH (ref 30.0–36.0)
MCV: 88.4 fL (ref 80.0–100.0)
Monocytes Absolute: 0.5 10*3/uL (ref 0.1–1.0)
Monocytes Relative: 8 %
Neutro Abs: 3.5 10*3/uL (ref 1.7–7.7)
Neutrophils Relative %: 58 %
Platelet Count: 170 10*3/uL (ref 150–400)
RBC: 6.19 MIL/uL — ABNORMAL HIGH (ref 4.22–5.81)
RDW: 13.5 % (ref 11.5–15.5)
WBC Count: 6.1 10*3/uL (ref 4.0–10.5)
nRBC: 0 % (ref 0.0–0.2)

## 2021-03-11 NOTE — Progress Notes (Signed)
Patient here for therapeutic phlebotomy. 18 gauge iv started in right AC. After 30 ml of blood Iv clotted 16 gauge kit used on left ac 70 mls of blood was removed tape came loose from patients line and needle was removed. At 1450 new 18 gauge iv was inserted above left ac. 430 grams of blood removed for a total of 530. Patient waited 30 minute post observation and received snack and fluids. VSS upon discharge.

## 2021-03-19 ENCOUNTER — Other Ambulatory Visit (HOSPITAL_COMMUNITY): Payer: Self-pay

## 2021-03-19 ENCOUNTER — Other Ambulatory Visit: Payer: Self-pay | Admitting: Family Medicine

## 2021-03-19 DIAGNOSIS — G4733 Obstructive sleep apnea (adult) (pediatric): Secondary | ICD-10-CM | POA: Diagnosis not present

## 2021-03-19 DIAGNOSIS — E1121 Type 2 diabetes mellitus with diabetic nephropathy: Secondary | ICD-10-CM

## 2021-03-19 MED ORDER — EMPAGLIFLOZIN 10 MG PO TABS
10.0000 mg | ORAL_TABLET | Freq: Every day | ORAL | 0 refills | Status: DC
  Filled 2021-03-19: qty 90, 90d supply, fill #0

## 2021-03-19 MED FILL — Sacubitril-Valsartan Tab 24-26 MG: ORAL | 90 days supply | Qty: 180 | Fill #1 | Status: AC

## 2021-03-28 ENCOUNTER — Other Ambulatory Visit: Payer: Self-pay

## 2021-03-28 ENCOUNTER — Ambulatory Visit: Payer: 59 | Admitting: Family Medicine

## 2021-03-28 VITALS — BP 126/82 | HR 46 | Temp 96.9°F | Wt 260.0 lb

## 2021-03-28 DIAGNOSIS — E1169 Type 2 diabetes mellitus with other specified complication: Secondary | ICD-10-CM

## 2021-03-28 DIAGNOSIS — L4050 Arthropathic psoriasis, unspecified: Secondary | ICD-10-CM | POA: Diagnosis not present

## 2021-03-28 DIAGNOSIS — Z8673 Personal history of transient ischemic attack (TIA), and cerebral infarction without residual deficits: Secondary | ICD-10-CM

## 2021-03-28 DIAGNOSIS — K219 Gastro-esophageal reflux disease without esophagitis: Secondary | ICD-10-CM | POA: Diagnosis not present

## 2021-03-28 DIAGNOSIS — E669 Obesity, unspecified: Secondary | ICD-10-CM

## 2021-03-28 DIAGNOSIS — Z96651 Presence of right artificial knee joint: Secondary | ICD-10-CM

## 2021-03-28 DIAGNOSIS — G4733 Obstructive sleep apnea (adult) (pediatric): Secondary | ICD-10-CM

## 2021-03-28 DIAGNOSIS — E1159 Type 2 diabetes mellitus with other circulatory complications: Secondary | ICD-10-CM | POA: Diagnosis not present

## 2021-03-28 DIAGNOSIS — E1121 Type 2 diabetes mellitus with diabetic nephropathy: Secondary | ICD-10-CM

## 2021-03-28 DIAGNOSIS — I4821 Permanent atrial fibrillation: Secondary | ICD-10-CM

## 2021-03-28 DIAGNOSIS — I152 Hypertension secondary to endocrine disorders: Secondary | ICD-10-CM

## 2021-03-28 DIAGNOSIS — E785 Hyperlipidemia, unspecified: Secondary | ICD-10-CM

## 2021-03-28 DIAGNOSIS — D45 Polycythemia vera: Secondary | ICD-10-CM

## 2021-03-28 DIAGNOSIS — D126 Benign neoplasm of colon, unspecified: Secondary | ICD-10-CM

## 2021-03-28 LAB — POCT GLYCOSYLATED HEMOGLOBIN (HGB A1C): Hemoglobin A1C: 5.6 % (ref 4.0–5.6)

## 2021-03-28 LAB — POCT UA - MICROALBUMIN
Albumin/Creatinine Ratio, Urine, POC: 105.2
Creatinine, POC: 136.6 mg/dL
Microalbumin Ur, POC: 77 mg/L

## 2021-03-28 NOTE — Progress Notes (Signed)
Subjective:    Patient ID: [name]., male    DOB: 1961-03-22, 60 y.o.   MRN: [id number]  [name]. is a 60 y.o. male who presents for follow-up of Type 2 diabetes mellitus. Home blood sugar records: fasting range: 128 avg. Current symptoms/problems include none and have been improving. Daily foot checks:yes   Any foot concerns: none at this time Exercise: walking QD and Gym  Diet: He and his wife are now doing Optiva and he has lost over 30 pounds.  He is happy with his weight loss.  He does find staying on this program very difficult.  He has a goal weight of 220 pounds. He seems to doing fairly well with his previous knee surgery.  He does have reflux disease and is using a PPI every 3 days with good result.  He does have a history of colonic polyps and is scheduled for repeat colonoscopy in 2024.  He continues on Jardiance without difficulty.  He is also taking Coreg as well as Entresto.  Continues on Xarelto and having no difficulty with that.  He is also taking Crestor.  He continues on CPAP and is very comfortable with that.  His psoriatic arthritis seems to be under good control.  He sees hematology regularly and is getting monthly blood draws for his polycythemia.  Continues on Xarelto has not had any bruising. The following portions of the patient's history were reviewed and updated as appropriate: allergies, current medications, past medical history, past social history and problem list.  ROS as in subjective above.     Objective:    Physical Exam Alert and in no distress foot exam is recorded. Hemoglobin A1c is 5.6 Lab Review Diabetic Labs Latest Ref Rng & Units 11/25/2020 09/03/2020 07/09/2020 06/26/2020 06/19/2020  HbA1c 4.0 - 5.6 % 6.8(A) - - - -  Microalbumin mg/L - - - - -  Micro/Creat Ratio - - - - - -  Chol 100 - 199 mg/dL 111 - - - -  HDL >39 mg/dL 35(L) - - - -  Calc LDL 0 - 99 mg/dL 38 - - - -  Triglycerides 0 - 149 mg/dL 248(H) - - - -  Creatinine  0.70 - 1.25 mg/dL - 1.18 1.08 1.12 1.07   BP/Weight 03/11/2021 03/05/2021 02/11/2021 02/04/2021 [id number]  Systolic BP [id number]  Diastolic BP 70 74 64 64 91  Wt. (Lbs) - 264 - 272.7 -  BMI - 37.88 - 39.13 -   Foot/eye exam completion dates Latest Ref Rng & Units 07/12/2020 07/11/2019  Eye Exam No Retinopathy No Retinopathy No Retinopathy  Foot Form Completion - - -    Kaniel  reports that he has never smoked. He has never used smokeless tobacco. He reports current alcohol use. He reports that he does not use drugs.     Assessment & Plan:    Type 2 diabetes mellitus with diabetic nephropathy, without long-term current use of insulin (HCC) - Plan: POCT glycosylated hemoglobin (Hb A1C), POCT UA - Microalbumin  Obstructive sleep apnea  Hx-TIA (transient ischemic attack)  Gastroesophageal reflux disease without esophagitis  Obesity (BMI 30-39.9)  Psoriatic arthritis ([place])  Hyperlipidemia associated with type 2 diabetes mellitus ([place])  Hypertension associated with diabetes ([place])  Polycythemia vera ([place])  Diabetic nephropathy associated with type 2 diabetes mellitus ([place])  Permanent atrial fibrillation (HCC)  Tubular adenoma of colon  S/P TKR (total knee replacement), right   Rx changes: none Education: Reviewed '  ABCs' of diabetes management (respective goals in parentheses):  A1C (<7), blood pressure (<130/80), and cholesterol (LDL <100). Compliance at present is estimated to be excellent. Efforts to improve compliance (if necessary) will be directed at  no change . Follow up: 4 months He has done an excellent job.  I did discuss rather than sending a goal weight to get to a particular waist size and work on staying there.  Since his wife is helping him that should be a little bit easier.  Discussed his medication regimen at this point he is not interested in changing anything.  He understands the interplay between all of his medications and any changes would need to be  coordinated with cardiology.

## 2021-03-31 ENCOUNTER — Other Ambulatory Visit (HOSPITAL_COMMUNITY): Payer: Self-pay

## 2021-04-01 ENCOUNTER — Other Ambulatory Visit (HOSPITAL_COMMUNITY): Payer: Self-pay

## 2021-04-02 ENCOUNTER — Other Ambulatory Visit (HOSPITAL_COMMUNITY): Payer: Self-pay

## 2021-04-07 ENCOUNTER — Encounter: Payer: Self-pay | Admitting: Family Medicine

## 2021-04-07 ENCOUNTER — Ambulatory Visit: Payer: 59 | Admitting: Family Medicine

## 2021-04-07 ENCOUNTER — Other Ambulatory Visit: Payer: Self-pay

## 2021-04-07 VITALS — BP 102/72 | HR 75 | Temp 97.0°F | Wt 258.8 lb

## 2021-04-07 DIAGNOSIS — G4733 Obstructive sleep apnea (adult) (pediatric): Secondary | ICD-10-CM | POA: Diagnosis not present

## 2021-04-07 DIAGNOSIS — R1011 Right upper quadrant pain: Secondary | ICD-10-CM | POA: Diagnosis not present

## 2021-04-07 DIAGNOSIS — D45 Polycythemia vera: Secondary | ICD-10-CM

## 2021-04-07 NOTE — Progress Notes (Signed)
   Subjective:    Patient ID: [name]., male    DOB: 1961/02/21, 60 y.o.   MRN: [id number]  HPI He states that he has a 1 day history of abdominal pain that is associated with excessive eructation and flatulence.  This pain started roughly 1 hour after eating.  Part of the meal was gauze and island dressing on his salad.  The pain has continued.  He did eat lunch but it was a liquid type lunch and causes no symptoms.  He is still having some discomfort.   Review of Systems     Objective:   Physical Exam Alert and complaining of abdominal pain.  Bowel sounds are present.  Tender in right upper quadrant with positive Murphy sign but not Murphy's punch.       Assessment & Plan:  Right upper quadrant pain - Plan: US Abdomen Limited RUQ (LIVER/GB) I explained that I thought this could be gallbladder disease and this is likely to be initially go after.  He is to be cautious concerning eating high-fat foods.  He will keep me informed.  Discussed pain medication and he is not interested in codeine.

## 2021-04-08 ENCOUNTER — Inpatient Hospital Stay: Payer: 59 | Admitting: Internal Medicine

## 2021-04-08 ENCOUNTER — Other Ambulatory Visit: Payer: Self-pay | Admitting: Family Medicine

## 2021-04-08 ENCOUNTER — Inpatient Hospital Stay: Payer: 59 | Attending: Internal Medicine

## 2021-04-08 ENCOUNTER — Inpatient Hospital Stay: Payer: 59

## 2021-04-08 ENCOUNTER — Other Ambulatory Visit: Payer: Self-pay | Admitting: Internal Medicine

## 2021-04-08 ENCOUNTER — Other Ambulatory Visit (HOSPITAL_COMMUNITY): Payer: Self-pay

## 2021-04-08 VITALS — BP 98/61 | HR 79 | Temp 97.8°F | Resp 17 | Wt 258.5 lb

## 2021-04-08 DIAGNOSIS — D45 Polycythemia vera: Secondary | ICD-10-CM | POA: Diagnosis not present

## 2021-04-08 DIAGNOSIS — G4733 Obstructive sleep apnea (adult) (pediatric): Secondary | ICD-10-CM | POA: Diagnosis not present

## 2021-04-08 DIAGNOSIS — D751 Secondary polycythemia: Secondary | ICD-10-CM | POA: Diagnosis not present

## 2021-04-08 LAB — CMP (CANCER CENTER ONLY)
ALT: 25 U/L (ref 0–44)
AST: 19 U/L (ref 15–41)
Albumin: 3.8 g/dL (ref 3.5–5.0)
Alkaline Phosphatase: 50 U/L (ref 38–126)
Anion gap: 10 (ref 5–15)
BUN: 29 mg/dL — ABNORMAL HIGH (ref 8–23)
CO2: 21 mmol/L — ABNORMAL LOW (ref 22–32)
Calcium: 9.8 mg/dL (ref 8.9–10.3)
Chloride: 103 mmol/L (ref 98–111)
Creatinine: 1.31 mg/dL — ABNORMAL HIGH (ref 0.61–1.24)
GFR, Estimated: >60 mL/min (ref >60)
Glucose, Bld: 109 mg/dL — ABNORMAL HIGH (ref 70–99)
Potassium: 4.6 mmol/L (ref 3.5–5.1)
Sodium: 134 mmol/L — ABNORMAL LOW (ref 135–145)
Total Bilirubin: 1.5 mg/dL — ABNORMAL HIGH (ref 0.3–1.2)
Total Protein: 6.8 g/dL (ref 6.5–8.1)

## 2021-04-08 LAB — CBC WITH DIFFERENTIAL (CANCER CENTER ONLY)
Abs Immature Granulocytes: 0.03 10*3/uL (ref 0.00–0.07)
Basophils Absolute: 0 10*3/uL (ref 0.0–0.1)
Basophils Relative: 0 %
Eosinophils Absolute: 0.2 10*3/uL (ref 0.0–0.5)
Eosinophils Relative: 2 %
HCT: 53.6 % — ABNORMAL HIGH (ref 39.0–52.0)
Hemoglobin: 19.3 g/dL — ABNORMAL HIGH (ref 13.0–17.0)
Immature Granulocytes: 0 %
Lymphocytes Relative: 28 %
Lymphs Abs: 1.9 10*3/uL (ref 0.7–4.0)
MCH: 31.3 pg (ref 26.0–34.0)
MCHC: 36 g/dL (ref 30.0–36.0)
MCV: 86.9 fL (ref 80.0–100.0)
Monocytes Absolute: 0.6 10*3/uL (ref 0.1–1.0)
Monocytes Relative: 9 %
Neutro Abs: 4.2 10*3/uL (ref 1.7–7.7)
Neutrophils Relative %: 61 %
Platelet Count: 145 10*3/uL — ABNORMAL LOW (ref 150–400)
RBC: 6.17 MIL/uL — ABNORMAL HIGH (ref 4.22–5.81)
RDW: 13.3 % (ref 11.5–15.5)
WBC Count: 6.9 10*3/uL (ref 4.0–10.5)
nRBC: 0 % (ref 0.0–0.2)

## 2021-04-08 LAB — LACTATE DEHYDROGENASE: LDH: 126 U/L (ref 98–192)

## 2021-04-08 MED ORDER — CLOBETASOL PROPIONATE 0.05 % EX CREA
TOPICAL_CREAM | Freq: Two times a day (BID) | CUTANEOUS | 0 refills | Status: DC
  Filled 2021-04-08: qty 30, 15d supply, fill #0

## 2021-04-08 NOTE — Progress Notes (Signed)
[facility] [phone]   Fax:(336) [name] [address]  DIAGNOSIS: Persistent polycythemia highly suspicious for polycythemia vera with negative JAK-2 mutation. Negative MPL515 mutation, Negative BCR/ABL and negative JAK-2 exon 12.  PRIOR THERAPY: None  CURRENT THERAPY: Phlebotomy on as-needed basis.  INTERVAL HISTORY: [name]. 60 y.o. male returns to the clinic today for follow-up visit accompanied by his wife.  The patient is feeling fine today with no concerning complaints.  He had right upper quadrant abdominal pain recently and he was seen by his primary care physician Dr. Redmond School and currently undergoing evaluation for gallbladder disease.  He denied having any current chest pain but has shortness of breath with exertion.  He has a history of sleep apnea and using CPAP at nighttime.  He denied having any cough or hemoptysis.  He has no nausea, vomiting, diarrhea or constipation.  He intentionally lost few pound recently.  He denied having any headache or visual changes.  He is here today for evaluation with repeat blood work and phlebotomy if needed.   MEDICAL HISTORY: Past Medical History:  Diagnosis Date   Arthritis    Diabetes mellitus without complication ([place])    TYPE 2   Diverticulosis    Dyslipidemia    GERD (gastroesophageal reflux disease)    Herpes labialis    Hypertension    Longstanding persistent atrial fibrillation (HCC)    Metabolic syndrome    Obesity    Proteinuria    Psoriasis    Seborrheic dermatitis    Sleep apnea    very compliant with CPAP, PT NEEDS TO BRING OWN MACHINE   TIA (transient ischemic attack) 2009    ALLERGIES:  is allergic to morphine and related.  MEDICATIONS:  Current Outpatient Medications  Medication Sig Dispense Refill   carvedilol (COREG) 25 MG tablet Take 1 tablet (25 mg total) by mouth 2 (two) times daily.  Please Schedule appt for future refills. 180 tablet 0   clobetasol cream (TEMOVATE) 0.05 % APPLY TOPICALLY TO AFFECTED AREA TWICE DAILY. 30 g 0   doxycycline (VIBRA-TABS) 100 MG tablet Take 1 tablet (100 mg total) by mouth 2 (two) times daily. 180 tablet 3   empagliflozin (JARDIANCE) 10 MG TABS tablet TAKE 1 TABLET BY MOUTH ONCE DAILY 90 tablet 0   glucose blood test strip 1 each by Other route as needed. Use as instructed Freestyle test stripts 100 each 3   glucose monitoring kit (FREESTYLE) monitoring kit 1 each by Does not apply route as needed. 1 each 0   Lancets (FREESTYLE) lancets 1 each by Other route as needed. Use as instructed 100 each 3   Multiple Vitamin (MULTIVITAMIN WITH MINERALS) TABS tablet Take 1 tablet by mouth daily.     mupirocin ointment (BACTROBAN) 2 % mupirocin 2 % topical ointment  APPLY TOPICALLY TO THE AFFECTED AREA ON THE SKIN TWICE DAILY.     oxymetazoline (AFRIN) 0.05 % nasal spray Place 1 spray into both nostrils at bedtime.     pantoprazole (PROTONIX) 40 MG tablet Take 1 tablet (40 mg total) by mouth 2 (two) times daily. 270 tablet 3   rivaroxaban (XARELTO) 20 MG TABS tablet TAKE 1 TABLET BY MOUTH DAILY WITH SUPPER. 90 tablet 1   rosuvastatin (CRESTOR) 10 MG tablet TAKE 1 TABLET (10 MG TOTAL) BY MOUTH DAILY. 90 tablet 2   sacubitril-valsartan (ENTRESTO) 24-26 MG TAKE 1  TABLET BY MOUTH 2 (TWO) TIMES DAILY. 180 tablet 3   TALTZ 80 MG/ML SOAJ Inject 1 ml (57m) under the skin every 4 weeks. 1 mL 8   Vitamin D, Cholecalciferol, 1000 units CAPS Take 1,000 Units by mouth daily.      No current facility-administered medications for this visit.    SURGICAL HISTORY:  Past Surgical History:  Procedure Laterality Date   CARDIOVERSION N/A 12/02/2016   Procedure: CARDIOVERSION;  Surgeon: KPixie Casino MD;  [facility];  Service: Cardiovascular;  Laterality: N/A;   COLONOSCOPY     EXCISIONAL TOTAL KNEE ARTHROPLASTY WITH ANTIBIOTIC SPACERS Right 03/28/2019    Procedure: EXCISIONAL TOTAL KNEE ARTHROPLASTY WITH ANTIBIOTIC SPACERS;  Surgeon: OParalee Cancel MD;  Location: [facility];  Service: Orthopedics;  Laterality: Right;  90 mins   I & D KNEE WITH POLY EXCHANGE Right 09/10/2018   Procedure: Right Knee Arthroplasty IRRIGATION AND DEBRIDEMENT KNEE WITH POLY EXCHANGE;  Surgeon: OParalee Cancel MD;  Location: [facility];  Service: Orthopedics;  Laterality: Right;   I & D KNEE WITH POLY EXCHANGE Right 06/25/2020   Procedure: Open excisional and non excisional debridement right knee, possible aspiration versus open arthrotomy poly exchange;  Surgeon: OParalee Cancel MD;  Location: [facility];  Service: Orthopedics;  Laterality: Right;   IRRIGATION AND DEBRIDEMENT KNEE Right 07/09/2020   Procedure: IRRIGATION AND DEBRIDEMENT KNEE;  Surgeon: OParalee Cancel MD;  Location: [facility];  Service: Orthopedics;  Laterality: Right;   JOINT REPLACEMENT  11/03/10   LT HIP   PFO occluder cardiac valve  2006   Dr. GEinar Gip (hole in heart)   REIMPLANTATION OF TOTAL KNEE Right 06/15/2019   Procedure: Resection of antibiotic spacer and  irrigation and debridment and placement of new antibiotic spacer and components;  Surgeon: OParalee Cancel MD;  Location: [facility];  Service: Orthopedics;  Laterality: Right;  90 mins   REIMPLANTATION OF TOTAL KNEE Right 08/22/2019   Procedure: REIMPLANTATION OF TOTAL KNEE;  Surgeon: OParalee Cancel MD;  Location: [facility];  Service: Orthopedics;  Laterality: Right;  120 mins   SKIN BIOPSY Left 12/22/2019   epidermal inclusion cyst   TONSILLECTOMY AND ADENOIDECTOMY     TOTAL HIP ARTHROPLASTY Left    TOTAL KNEE ARTHROPLASTY Right 05/31/2018   Procedure: RIGHT TOTAL KNEE ARTHROPLASTY;  Surgeon: OParalee Cancel MD;  Location: [facility];  Service: Orthopedics;  Laterality: Right;  70 mins   TOTAL KNEE ARTHROPLASTY Left 06/28/2018   Procedure: LEFT TOTAL KNEE ARTHROPLASTY;  Surgeon: OParalee Cancel MD;  Location: [facility];  Service: Orthopedics;  Laterality: Left;  70 mins    WISDOM TOOTH EXTRACTION      REVIEW OF SYSTEMS:  A comprehensive review of systems was negative.   PHYSICAL EXAMINATION: General appearance: alert, cooperative and no distress Head: Normocephalic, without obvious abnormality, atraumatic Neck: no adenopathy, no JVD, supple, symmetrical, trachea midline and thyroid not enlarged, symmetric, no tenderness/mass/nodules Lymph nodes: Cervical, supraclavicular, and axillary nodes normal. Resp: clear to auscultation bilaterally Back: symmetric, no curvature. ROM normal. No CVA tenderness. Cardio: regular rate and rhythm, S1, S2 normal, no murmur, click, rub or gallop GI: soft, non-tender; bowel sounds normal; no masses,  no organomegaly Extremities: extremities normal, atraumatic, no cyanosis or edema  ECOG PERFORMANCE STATUS: 1 - Symptomatic but completely ambulatory  Blood pressure 98/61, pulse 79, temperature 97.8 F (36.6 C), temperature source Oral, resp. rate 17, weight 258 lb 8 oz (117.3 kg), SpO2 97 %.  LABORATORY DATA: Lab Results  Component Value Date  WBC 6.9 04/08/2021   HGB 19.3 (H) 04/08/2021   HCT 53.6 (H) 04/08/2021   MCV 86.9 04/08/2021   PLT 145 (L) 04/08/2021      Chemistry      Component Value Date/Time   NA 138 09/03/2020 1620   NA 141 05/27/2020 0916   NA 138 05/22/2015 1303   K 4.3 09/03/2020 1620   K 4.1 05/22/2015 1303   CL 102 09/03/2020 1620   CO2 26 09/03/2020 1620   CO2 23 05/22/2015 1303   BUN 23 09/03/2020 1620   BUN 22 05/27/2020 0916   BUN 17.4 05/22/2015 1303   CREATININE 1.18 09/03/2020 1620   CREATININE 1.1 05/22/2015 1303      Component Value Date/Time   CALCIUM 9.6 09/03/2020 1620   CALCIUM 9.4 05/22/2015 1303   ALKPHOS 59 05/27/2020 0916   ALKPHOS 73 05/22/2015 1303   AST 23 05/27/2020 0916   AST 24 05/22/2015 1303   ALT 33 05/27/2020 0916   ALT 30 05/22/2015 1303   BILITOT 1.1 05/27/2020 0916   BILITOT 0.77 05/22/2015 1303       RADIOGRAPHIC STUDIES: No results  found.  ASSESSMENT AND PLAN: This is a very pleasant 59 years old white male with polycythemia highly suspicious for polycythemia vera but with negative JAK mutation.  He has been receiving phlebotomy on as-needed basis to keep his hematocrit between 45-50%. CBC today showed hematocrit of 53.6%. I recommended for the patient to proceed with phlebotomy today as planned. I will also arrange for the patient to have phlebotomy in 2 weeks. I will see him back for follow-up visit in 4 weeks for evaluation with repeat blood work and phlebotomy if needed. I also encouraged the patient to seek further evaluation for his sleep apnea which likely contributing to his polycythemia. The patient was advised to call immediately if he has any other concerning symptoms in the interval. The patient voices understanding of current disease status and treatment options and is in agreement with the current care plan.  All questions were answered. The patient knows to call the clinic with any problems, questions or concerns. We can certainly see the patient much sooner if necessary.   Disclaimer: This note was dictated with voice recognition software. Similar sounding words can inadvertently be transcribed and may not be corrected upon review.

## 2021-04-08 NOTE — Telephone Encounter (Signed)
Received request for a refill on the pts. Clobetasol cream pt.ast apt was 04/07/21.

## 2021-04-08 NOTE — Progress Notes (Signed)
Phlebotomy performed with 18G. Initiated at 15:05 and ended at 15:21. Obtained 523 g. Patient tolerated well. Drinking fluids and declined food. Patient remained for 30 min post obs and was discharged ambulatory to the lobby.

## 2021-04-09 ENCOUNTER — Other Ambulatory Visit: Payer: Self-pay

## 2021-04-09 ENCOUNTER — Ambulatory Visit
Admission: RE | Admit: 2021-04-09 | Discharge: 2021-04-09 | Disposition: A | Discharge status: Home / Self Care | Payer: 59 | Source: Ambulatory Visit | Attending: Family Medicine | Admitting: Family Medicine

## 2021-04-09 DIAGNOSIS — R1011 Right upper quadrant pain: Secondary | ICD-10-CM | POA: Diagnosis not present

## 2021-04-10 NOTE — Addendum Note (Signed)
Addended by: Denita Lung on: 04/10/2021 01:14 PM   Modules accepted: Orders

## 2021-04-11 ENCOUNTER — Telehealth: Payer: Self-pay | Admitting: Internal Medicine

## 2021-04-11 NOTE — Telephone Encounter (Signed)
Per 8/9 los left message,

## 2021-04-21 ENCOUNTER — Other Ambulatory Visit (HOSPITAL_COMMUNITY): Payer: Self-pay

## 2021-04-21 MED FILL — Rosuvastatin Calcium Tab 10 MG: ORAL | 90 days supply | Qty: 90 | Fill #1 | Status: AC

## 2021-04-22 ENCOUNTER — Other Ambulatory Visit: Payer: Self-pay

## 2021-04-22 ENCOUNTER — Inpatient Hospital Stay: Payer: 59

## 2021-04-22 VITALS — BP 93/69 | HR 68 | Temp 98.8°F | Resp 16

## 2021-04-22 DIAGNOSIS — D45 Polycythemia vera: Secondary | ICD-10-CM

## 2021-04-22 DIAGNOSIS — D751 Secondary polycythemia: Secondary | ICD-10-CM | POA: Diagnosis not present

## 2021-04-22 LAB — CBC WITH DIFFERENTIAL (CANCER CENTER ONLY)
Abs Immature Granulocytes: 0.02 10*3/uL (ref 0.00–0.07)
Basophils Absolute: 0 10*3/uL (ref 0.0–0.1)
Basophils Relative: 0 %
Eosinophils Absolute: 0.1 10*3/uL (ref 0.0–0.5)
Eosinophils Relative: 2 %
HCT: 50.1 % (ref 39.0–52.0)
Hemoglobin: 17.7 g/dL — ABNORMAL HIGH (ref 13.0–17.0)
Immature Granulocytes: 0 %
Lymphocytes Relative: 31 %
Lymphs Abs: 2.1 10*3/uL (ref 0.7–4.0)
MCH: 30.3 pg (ref 26.0–34.0)
MCHC: 35.3 g/dL (ref 30.0–36.0)
MCV: 85.8 fL (ref 80.0–100.0)
Monocytes Absolute: 0.5 10*3/uL (ref 0.1–1.0)
Monocytes Relative: 8 %
Neutro Abs: 3.9 10*3/uL (ref 1.7–7.7)
Neutrophils Relative %: 59 %
Platelet Count: 169 10*3/uL (ref 150–400)
RBC: 5.84 MIL/uL — ABNORMAL HIGH (ref 4.22–5.81)
RDW: 13.6 % (ref 11.5–15.5)
WBC Count: 6.7 10*3/uL (ref 4.0–10.5)
nRBC: 0 % (ref 0.0–0.2)

## 2021-04-22 LAB — CMP (CANCER CENTER ONLY)
ALT: 25 U/L (ref 0–44)
AST: 20 U/L (ref 15–41)
Albumin: 3.8 g/dL (ref 3.5–5.0)
Alkaline Phosphatase: 48 U/L (ref 38–126)
Anion gap: 8 (ref 5–15)
BUN: 23 mg/dL (ref 8–23)
CO2: 23 mmol/L (ref 22–32)
Calcium: 9.3 mg/dL (ref 8.9–10.3)
Chloride: 105 mmol/L (ref 98–111)
Creatinine: 1.29 mg/dL — ABNORMAL HIGH (ref 0.61–1.24)
GFR, Estimated: >60 mL/min (ref >60)
Glucose, Bld: 117 mg/dL — ABNORMAL HIGH (ref 70–99)
Potassium: 4.5 mmol/L (ref 3.5–5.1)
Sodium: 136 mmol/L (ref 135–145)
Total Bilirubin: 1.3 mg/dL — ABNORMAL HIGH (ref 0.3–1.2)
Total Protein: 6.5 g/dL (ref 6.5–8.1)

## 2021-04-22 LAB — LACTATE DEHYDROGENASE: LDH: 115 U/L (ref 98–192)

## 2021-04-22 NOTE — Patient Instructions (Signed)

## 2021-04-28 ENCOUNTER — Other Ambulatory Visit (HOSPITAL_COMMUNITY): Payer: Self-pay

## 2021-04-30 ENCOUNTER — Other Ambulatory Visit (HOSPITAL_COMMUNITY): Payer: Self-pay

## 2021-05-06 ENCOUNTER — Inpatient Hospital Stay: Payer: 59 | Admitting: Internal Medicine

## 2021-05-06 ENCOUNTER — Inpatient Hospital Stay: Payer: 59

## 2021-05-06 ENCOUNTER — Other Ambulatory Visit: Payer: Self-pay

## 2021-05-06 ENCOUNTER — Inpatient Hospital Stay: Payer: 59 | Attending: Internal Medicine

## 2021-05-06 VITALS — BP 94/67 | HR 73 | Resp 18

## 2021-05-06 VITALS — BP 100/53 | HR 63 | Temp 97.6°F | Resp 18 | Wt 261.6 lb

## 2021-05-06 DIAGNOSIS — D45 Polycythemia vera: Secondary | ICD-10-CM

## 2021-05-06 DIAGNOSIS — D751 Secondary polycythemia: Secondary | ICD-10-CM | POA: Diagnosis not present

## 2021-05-06 LAB — CBC WITH DIFFERENTIAL (CANCER CENTER ONLY)
Abs Immature Granulocytes: 0.04 10*3/uL (ref 0.00–0.07)
Basophils Absolute: 0 10*3/uL (ref 0.0–0.1)
Basophils Relative: 1 %
Eosinophils Absolute: 0.2 10*3/uL (ref 0.0–0.5)
Eosinophils Relative: 2 %
HCT: 47 % (ref 39.0–52.0)
Hemoglobin: 17 g/dL (ref 13.0–17.0)
Immature Granulocytes: 1 %
Lymphocytes Relative: 29 %
Lymphs Abs: 2.3 10*3/uL (ref 0.7–4.0)
MCH: 29.8 pg (ref 26.0–34.0)
MCHC: 36.2 g/dL — ABNORMAL HIGH (ref 30.0–36.0)
MCV: 82.5 fL (ref 80.0–100.0)
Monocytes Absolute: 0.7 10*3/uL (ref 0.1–1.0)
Monocytes Relative: 9 %
Neutro Abs: 4.6 10*3/uL (ref 1.7–7.7)
Neutrophils Relative %: 58 %
Platelet Count: 168 10*3/uL (ref 150–400)
RBC: 5.7 MIL/uL (ref 4.22–5.81)
RDW: 13.6 % (ref 11.5–15.5)
WBC Count: 7.9 10*3/uL (ref 4.0–10.5)
nRBC: 0 % (ref 0.0–0.2)

## 2021-05-06 NOTE — Patient Instructions (Signed)

## 2021-05-06 NOTE — Progress Notes (Signed)
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 Negative MPL515 mutation, Negative BCR/ABL and negative JAK-2 exon 12.  PRIOR THERAPY: None  CURRENT THERAPY: Phlebotomy on as-needed basis.  INTERVAL HISTORY: [name]. 60 y.o. male returns to the clinic today for follow-up visit accompanied by his wife.  The patient is feeling fine today with no concerning complaints except for the shortness of breath with exertion.  He denied having any current chest pain, cough or hemoptysis.  He denied having any fever or chills.  He has no nausea, vomiting, diarrhea or constipation.  He has no headache or visual changes.  He has a history of sleep apnea and he uses CPAP at nighttime.  He is here today for evaluation with repeat CBC.  MEDICAL HISTORY: Past Medical History:  Diagnosis Date   Arthritis    Diabetes mellitus without complication ([place])    TYPE 2   Diverticulosis    Dyslipidemia    GERD (gastroesophageal reflux disease)    Herpes labialis    Hypertension    Longstanding persistent atrial fibrillation (HCC)    Metabolic syndrome    Obesity    Proteinuria    Psoriasis    Seborrheic dermatitis    Sleep apnea    very compliant with CPAP, PT NEEDS TO BRING OWN MACHINE   TIA (transient ischemic attack) 2009    ALLERGIES:  is allergic to morphine and related.  MEDICATIONS:  Current Outpatient Medications  Medication Sig Dispense Refill   carvedilol (COREG) 25 MG tablet Take 1 tablet (25 mg total) by mouth 2 (two) times daily. Please Schedule appt for future refills. 180 tablet 0   clobetasol cream (TEMOVATE) 0.05 % APPLY TOPICALLY TO AFFECTED AREA TWICE DAILY. 30 g 0   doxycycline (VIBRA-TABS) 100 MG tablet Take 1 tablet (100 mg total) by mouth 2 (two)  times daily. 180 tablet 3   empagliflozin (JARDIANCE) 10 MG TABS tablet TAKE 1 TABLET BY MOUTH ONCE DAILY 90 tablet 0   glucose blood test strip 1 each by Other route as needed. Use as instructed Freestyle test stripts 100 each 3   glucose monitoring kit (FREESTYLE) monitoring kit 1 each by Does not apply route as needed. 1 each 0   Lancets (FREESTYLE) lancets 1 each by Other route as needed. Use as instructed 100 each 3   Multiple Vitamin (MULTIVITAMIN WITH MINERALS) TABS tablet Take 1 tablet by mouth daily.     mupirocin ointment (BACTROBAN) 2 % mupirocin 2 % topical ointment  APPLY TOPICALLY TO THE AFFECTED AREA ON THE SKIN TWICE DAILY.     oxymetazoline (AFRIN) 0.05 % nasal spray Place 1 spray into both nostrils at bedtime.     pantoprazole (PROTONIX) 40 MG tablet Take 1 tablet (40 mg total) by mouth 2 (two) times daily. 270 tablet 3   rivaroxaban (XARELTO) 20 MG TABS tablet TAKE 1 TABLET BY MOUTH DAILY WITH SUPPER. 90 tablet 1   rosuvastatin (CRESTOR) 10 MG tablet TAKE 1 TABLET (10 MG TOTAL) BY MOUTH DAILY. 90 tablet 2   sacubitril-valsartan (ENTRESTO) 24-26 MG TAKE 1 TABLET BY MOUTH 2 (TWO) TIMES DAILY. 180 tablet 3   TALTZ 80 MG/ML SOAJ Inject 1 ml (90m) under the skin every 4 weeks. 1 mL 8   Vitamin D, Cholecalciferol, 1000 units  CAPS Take 1,000 Units by mouth daily.      No current facility-administered medications for this visit.    SURGICAL HISTORY:  Past Surgical History:  Procedure Laterality Date   CARDIOVERSION N/A 12/02/2016   Procedure: CARDIOVERSION;  Surgeon: Pixie Casino, MD;  Location: [facility] ENDOSCOPY;  Service: Cardiovascular;  Laterality: N/A;   COLONOSCOPY     EXCISIONAL TOTAL KNEE ARTHROPLASTY WITH ANTIBIOTIC SPACERS Right 03/28/2019   Procedure: EXCISIONAL TOTAL KNEE ARTHROPLASTY WITH ANTIBIOTIC SPACERS;  Surgeon: [name], MD;  Location: [facility] ORS;  Service: Orthopedics;  Laterality: Right;  90 mins   I & D KNEE WITH POLY EXCHANGE Right 09/10/2018   Procedure:  Right Knee Arthroplasty IRRIGATION AND DEBRIDEMENT KNEE WITH POLY EXCHANGE;  Surgeon: [name], MD;  Location: [facility];  Service: Orthopedics;  Laterality: Right;   I & D KNEE WITH POLY EXCHANGE Right 06/25/2020   Procedure: Open excisional and non excisional debridement right knee, possible aspiration versus open arthrotomy poly exchange;  Surgeon: [name], MD;  Location: [facility];  Service: Orthopedics;  Laterality: Right;   IRRIGATION AND DEBRIDEMENT KNEE Right 07/09/2020   Procedure: IRRIGATION AND DEBRIDEMENT KNEE;  Surgeon: [name], MD;  Location: [facility];  Service: Orthopedics;  Laterality: Right;   JOINT REPLACEMENT  11/03/10   LT HIP   PFO occluder cardiac valve  2006   Dr. Einar Gip, (hole in heart)   REIMPLANTATION OF TOTAL KNEE Right 06/15/2019   Procedure: Resection of antibiotic spacer and  irrigation and debridment and placement of new antibiotic spacer and components;  Surgeon: [name], MD;  Location: [facility];  Service: Orthopedics;  Laterality: Right;  90 mins   REIMPLANTATION OF TOTAL KNEE Right 08/22/2019   Procedure: REIMPLANTATION OF TOTAL KNEE;  Surgeon: [name], MD;  Location: [facility];  Service: Orthopedics;  Laterality: Right;  120 mins   SKIN BIOPSY Left 12/22/2019   epidermal inclusion cyst   TONSILLECTOMY AND ADENOIDECTOMY     TOTAL HIP ARTHROPLASTY Left    TOTAL KNEE ARTHROPLASTY Right 05/31/2018   Procedure: RIGHT TOTAL KNEE ARTHROPLASTY;  Surgeon: [name], MD;  Location: [facility];  Service: Orthopedics;  Laterality: Right;  70 mins   TOTAL KNEE ARTHROPLASTY Left 06/28/2018   Procedure: LEFT TOTAL KNEE ARTHROPLASTY;  Surgeon: [name], MD;  Location: [facility];  Service: Orthopedics;  Laterality: Left;  70 mins   WISDOM TOOTH EXTRACTION      REVIEW OF SYSTEMS:  A comprehensive review of systems was negative.   PHYSICAL EXAMINATION: General appearance: alert, cooperative and no distress Head: Normocephalic, without obvious abnormality,  atraumatic Neck: no adenopathy, no JVD, supple, symmetrical, trachea midline and thyroid not enlarged, symmetric, no tenderness/mass/nodules Lymph nodes: Cervical, supraclavicular, and axillary nodes normal. Resp: clear to auscultation bilaterally Back: symmetric, no curvature. ROM normal. No CVA tenderness. Cardio: regular rate and rhythm, S1, S2 normal, no murmur, click, rub or gallop GI: soft, non-tender; bowel sounds normal; no masses,  no organomegaly Extremities: extremities normal, atraumatic, no cyanosis or edema  ECOG PERFORMANCE STATUS: 1 - Symptomatic but completely ambulatory  Blood pressure (!) 100/53, pulse 63, temperature 97.6 F (36.4 C), temperature source Tympanic, resp. rate 18, weight 261 lb 9 oz (118.6 kg), SpO2 98 %.  LABORATORY DATA: Lab Results  Component Value Date   WBC 7.9 05/06/2021   HGB 17.0 05/06/2021   HCT 47.0 05/06/2021   MCV 82.5 05/06/2021   PLT 168 05/06/2021      Chemistry  Component Value Date/Time   NA 136 04/22/2021 1533   NA 141 05/27/2020 0916   NA 138 05/22/2015 1303   K 4.5 04/22/2021 1533   K 4.1 05/22/2015 1303   CL 105 04/22/2021 1533   CO2 23 04/22/2021 1533   CO2 23 05/22/2015 1303   BUN 23 04/22/2021 1533   BUN 22 05/27/2020 0916   BUN 17.4 05/22/2015 1303   CREATININE 1.29 (H) 04/22/2021 1533   CREATININE 1.18 09/03/2020 1620   CREATININE 1.1 05/22/2015 1303      Component Value Date/Time   CALCIUM 9.3 04/22/2021 1533   CALCIUM 9.4 05/22/2015 1303   ALKPHOS 48 04/22/2021 1533   ALKPHOS 73 05/22/2015 1303   AST 20 04/22/2021 1533   AST 24 05/22/2015 1303   ALT 25 04/22/2021 1533   ALT 30 05/22/2015 1303   BILITOT 1.3 (H) 04/22/2021 1533   BILITOT 0.77 05/22/2015 1303       RADIOGRAPHIC STUDIES: US Abdomen Limited RUQ (LIVER/GB)  Result Date: 04/10/2021 CLINICAL DATA:  Right upper quadrant pain EXAM: ULTRASOUND ABDOMEN LIMITED RIGHT UPPER QUADRANT COMPARISON:  None. FINDINGS: Gallbladder: A normal  gallbladder is not identified. Echogenic foci with posterior acoustic shadowing are seen, most likely reflecting a gallbladder impacted with gallstones. There is no pericholecystic fluid. Common bile duct: Diameter: 4 mm. There is no intra or extrahepatic biliary ductal dilatation. Liver: Parenchymal echogenicity is somewhat heterogeneous without focal or suspicious abnormality. Portal vein is patent on color Doppler imaging with normal direction of blood flow towards the liver. Other: None. IMPRESSION: The gallbladder is suboptimally evaluated but is most likely impacted with gallstones. No pericholecystic fluid to suggest acute cholecystitis. This could be further evaluated with CT. Electronically Signed   By: Valetta Mole MD   On: 04/10/2021 09:26    ASSESSMENT AND PLAN: This is a very pleasant 60 years old white male with polycythemia highly suspicious for polycythemia vera but with negative JAK mutation.  He has been receiving phlebotomy on as-needed basis to keep his hematocrit between 45-50%. His hematocrit today is 47%. I gave the patient the option of proceeding with phlebotomy today as planned versus continuous observation and repeat blood work in few weeks.  He would like to proceed with the phlebotomy today. I will see him back for follow-up visit in 4 weeks for evaluation with repeat CBC and phlebotomy if needed. For the history of sleep apnea and concern about deoxygenation that could be his etiology for his polycythemia, I will refer the patient to pulmonary medicine, Dr. Elsworth Soho for further evaluation of his condition. The patient was advised to call immediately if he has any other concerning symptoms in the interval. The patient voices understanding of current disease status and treatment options and is in agreement with the current care plan.  All questions were answered. The patient knows to call the clinic with any problems, questions or concerns. We can certainly see the patient much  sooner if necessary.   Disclaimer: This note was dictated with voice recognition software. Similar sounding words can inadvertently be transcribed and may not be corrected upon review.

## 2021-05-06 NOTE — Progress Notes (Signed)
[facility] presents to infusion clinic for therapeutic phlebotomy per MD orders. Procedure began at 1603 using 18g IV to LAC. Phlebotomy ended at 1615 with a total of 523g collected. IV removed intact. Vitals stable upon discharge, patient declined to stay for full 30 minute wait but was monitored for 15 minutes with no issues.

## 2021-05-07 ENCOUNTER — Telehealth: Payer: Self-pay | Admitting: Internal Medicine

## 2021-05-07 NOTE — Telephone Encounter (Signed)
Scheduled appt per 9/6 los - left message for patient with appt date and time

## 2021-05-08 ENCOUNTER — Other Ambulatory Visit (HOSPITAL_COMMUNITY): Payer: Self-pay

## 2021-05-08 ENCOUNTER — Other Ambulatory Visit (HOSPITAL_BASED_OUTPATIENT_CLINIC_OR_DEPARTMENT_OTHER): Payer: Self-pay

## 2021-05-12 ENCOUNTER — Encounter: Payer: Self-pay | Admitting: Internal Medicine

## 2021-05-12 ENCOUNTER — Other Ambulatory Visit (HOSPITAL_COMMUNITY): Payer: Self-pay

## 2021-05-13 ENCOUNTER — Other Ambulatory Visit: Payer: Self-pay | Admitting: Cardiovascular Disease

## 2021-05-13 ENCOUNTER — Other Ambulatory Visit (HOSPITAL_COMMUNITY): Payer: Self-pay

## 2021-05-13 MED ORDER — CARVEDILOL 25 MG PO TABS
25.0000 mg | ORAL_TABLET | Freq: Two times a day (BID) | ORAL | 0 refills | Status: DC
  Filled 2021-05-13: qty 60, 30d supply, fill #0

## 2021-05-22 DIAGNOSIS — H61022 Chronic perichondritis of left external ear: Secondary | ICD-10-CM | POA: Diagnosis not present

## 2021-05-22 DIAGNOSIS — L814 Other melanin hyperpigmentation: Secondary | ICD-10-CM | POA: Diagnosis not present

## 2021-05-22 DIAGNOSIS — Z85828 Personal history of other malignant neoplasm of skin: Secondary | ICD-10-CM | POA: Diagnosis not present

## 2021-05-22 DIAGNOSIS — L400 Psoriasis vulgaris: Secondary | ICD-10-CM | POA: Diagnosis not present

## 2021-05-22 DIAGNOSIS — L281 Prurigo nodularis: Secondary | ICD-10-CM | POA: Diagnosis not present

## 2021-05-23 ENCOUNTER — Encounter: Payer: Self-pay | Admitting: Internal Medicine

## 2021-05-23 ENCOUNTER — Other Ambulatory Visit (HOSPITAL_COMMUNITY): Payer: Self-pay

## 2021-05-27 ENCOUNTER — Ambulatory Visit: Payer: 59 | Admitting: Internal Medicine

## 2021-05-27 ENCOUNTER — Ambulatory Visit (INDEPENDENT_AMBULATORY_CARE_PROVIDER_SITE_OTHER): Payer: 59

## 2021-05-27 ENCOUNTER — Encounter: Payer: Self-pay | Admitting: Internal Medicine

## 2021-05-27 ENCOUNTER — Other Ambulatory Visit: Payer: Self-pay

## 2021-05-27 VITALS — BP 90/50 | HR 70 | Temp 97.5°F | Ht 70.0 in | Wt 261.0 lb

## 2021-05-27 DIAGNOSIS — R0902 Hypoxemia: Secondary | ICD-10-CM | POA: Insufficient documentation

## 2021-05-27 DIAGNOSIS — G4733 Obstructive sleep apnea (adult) (pediatric): Secondary | ICD-10-CM | POA: Diagnosis not present

## 2021-05-27 DIAGNOSIS — Z23 Encounter for immunization: Secondary | ICD-10-CM

## 2021-05-27 DIAGNOSIS — D751 Secondary polycythemia: Secondary | ICD-10-CM

## 2021-05-27 DIAGNOSIS — I700 Atherosclerosis of aorta: Secondary | ICD-10-CM | POA: Diagnosis not present

## 2021-05-27 NOTE — Assessment & Plan Note (Addendum)
Our question is whether he boceomes hypoxemic to explain polycythemia. At this visit he was not hypoxemic on room air at rest or with brisk walk. Plan- CXR, overnight oximetry while wearing his VPAP. If ONOX is unremarkable, then we can't show current hypoxemia to be significant.

## 2021-05-27 NOTE — Patient Instructions (Signed)
Order- CXR  dx polycythemia  Order- O2 qualifying walk test   dx polycythemia  Order- Flu vax standard  Order- DME [facility]- please replace old VPAP machine IPAP 11, EPAP 8, Easy Breathe on, mask of choice, humidifier, supplies, AirView/ card  Please call if we can help

## 2021-05-27 NOTE — Progress Notes (Signed)
05/27/21- 60 yo M never smoker for sleep evaluation courtesy of Dr Curt Bears with concern of polycythemia, OSA Medical problem list includes TIA, AFib, HTN, CHF, OSA, GERD, Hyperlipidemia, DM2, Nephropathy, Psoriasis, Psoriatic Arthritis, Polycythemia/ phlebotomy,  Obesity,  NPSG 10/26/05- AHI 47.3/ hr, body weight 300 lbs VPAP [facility] IPAP 11, EPAP 8 Download compliance 100%, AHI 0.1/ hr Epworth score-3 Body weight today- 261 lbs Covid vax- Flu vax- He denies hx of lung disease but detailed his complicated hx of multiple knee replacement surgeries, AFib and CHF. Denies hx or PE. No unusual dyspnea, cough or wheeze. Dr Redmond School has recommended weight loss. His wife sleeps in separate room, but denies hearing him snore wiwth his VPAP on. Machine is now well over 14 years old. He and his wife today would like me to take over management and start process of replacement with newer machine.  Walk Test on room air today/ 05/27/21- room air, 750 feet, "fast pace"- lowest O2 sat 95%, max HR 61/min.  Prior to Admission medications   Medication Sig Start Date End Date Taking? Authorizing Provider  carvedilol (COREG) 25 MG tablet Take 1 tablet (25 mg total) by mouth 2 (two) times daily. Please make overdue appt for future refills. Thank you 1st attempt 05/13/21  Yes [name], Wonda Cheng, MD  clobetasol cream (TEMOVATE) 0.05 % APPLY TOPICALLY TO AFFECTED AREA TWICE DAILY. 04/08/21  Yes Denita Lung, MD  doxycycline (VIBRA-TABS) 100 MG tablet Take 1 tablet (100 mg total) by mouth 2 (two) times daily. 12/02/20  Yes Carlyle Basques, MD  empagliflozin (JARDIANCE) 10 MG TABS tablet TAKE 1 TABLET BY MOUTH ONCE DAILY 03/19/21  Yes Denita Lung, MD  glucose blood test strip 1 each by Other route as needed. Use as instructed Freestyle test stripts 08/30/19  Yes Denita Lung, MD  glucose monitoring kit (FREESTYLE) monitoring kit 1 each by Does not apply route as needed. 08/30/19  Yes Denita Lung, MD  Lancets  (FREESTYLE) lancets 1 each by Other route as needed. Use as instructed 08/30/19  Yes Denita Lung, MD  Multiple Vitamin (MULTIVITAMIN WITH MINERALS) TABS tablet Take 1 tablet by mouth daily.   Yes [provider]  mupirocin ointment (BACTROBAN) 2 % mupirocin 2 % topical ointment  APPLY TOPICALLY TO THE AFFECTED AREA ON THE SKIN TWICE DAILY.   Yes [provider]  oxymetazoline (AFRIN) 0.05 % nasal spray Place 1 spray into both nostrils at bedtime.   Yes [provider]  pantoprazole (PROTONIX) 40 MG tablet Take 1 tablet (40 mg total) by mouth 2 (two) times daily. 01/23/21  Yes Denita Lung, MD  rivaroxaban (XARELTO) 20 MG TABS tablet TAKE 1 TABLET BY MOUTH DAILY WITH SUPPER. 11/25/20 11/25/21 Yes [name], Wonda Cheng, MD  rosuvastatin (CRESTOR) 10 MG tablet TAKE 1 TABLET (10 MG TOTAL) BY MOUTH DAILY. 10/14/20 10/14/21 Yes Denita Lung, MD  sacubitril-valsartan (ENTRESTO) 24-26 MG TAKE 1 TABLET BY MOUTH 2 (TWO) TIMES DAILY. 09/30/20 09/30/21 Yes [name], Wonda Cheng, MD  TALTZ 80 MG/ML SOAJ Inject 1 ml (72m) under the skin every 4 weeks. 01/13/21  Yes JTresa Garter MD  Vitamin D, Cholecalciferol, 1000 units CAPS Take 1,000 Units by mouth daily.    Yes [provider]   Past Medical History:  Diagnosis Date   Arthritis    Diabetes mellitus without complication ([place]    TYPE 2   Diverticulosis    Dyslipidemia    GERD (gastroesophageal reflux disease)  Herpes labialis    Hypertension    Longstanding persistent atrial fibrillation ([facility])    Metabolic syndrome    Obesity    Proteinuria    Psoriasis    Seborrheic dermatitis    Sleep apnea    very compliant with CPAP, PT NEEDS TO BRING OWN MACHINE   TIA (transient ischemic attack) 2009   Past Surgical History:  Procedure Laterality Date   CARDIOVERSION N/A 12/02/2016   Procedure: CARDIOVERSION;  Surgeon: Pixie Casino, MD;  Location: [facility] ENDOSCOPY;  Service: Cardiovascular;  Laterality: N/A;    COLONOSCOPY     EXCISIONAL TOTAL KNEE ARTHROPLASTY WITH ANTIBIOTIC SPACERS Right 03/28/2019   Procedure: EXCISIONAL TOTAL KNEE ARTHROPLASTY WITH ANTIBIOTIC SPACERS;  Surgeon: [name], MD;  Location: [facility];  Service: Orthopedics;  Laterality: Right;  90 mins   I & D KNEE WITH POLY EXCHANGE Right 09/10/2018   Procedure: Right Knee Arthroplasty IRRIGATION AND DEBRIDEMENT KNEE WITH POLY EXCHANGE;  Surgeon: [name], MD;  Location: [facility];  Service: Orthopedics;  Laterality: Right;   I & D KNEE WITH POLY EXCHANGE Right 06/25/2020   Procedure: Open excisional and non excisional debridement right knee, possible aspiration versus open arthrotomy poly exchange;  Surgeon: [name], MD;  Location: [facility];  Service: Orthopedics;  Laterality: Right;   IRRIGATION AND DEBRIDEMENT KNEE Right 07/09/2020   Procedure: IRRIGATION AND DEBRIDEMENT KNEE;  Surgeon: [name], MD;  Location: [facility];  Service: Orthopedics;  Laterality: Right;   JOINT REPLACEMENT  11/03/10   LT HIP   PFO occluder cardiac valve  2006   Dr. Einar Gip, (hole in heart)   REIMPLANTATION OF TOTAL KNEE Right 06/15/2019   Procedure: Resection of antibiotic spacer and  irrigation and debridment and placement of new antibiotic spacer and components;  Surgeon: [name], MD;  Location: [facility];  Service: Orthopedics;  Laterality: Right;  90 mins   REIMPLANTATION OF TOTAL KNEE Right 08/22/2019   Procedure: REIMPLANTATION OF TOTAL KNEE;  Surgeon: [name], MD;  Location: [facility];  Service: Orthopedics;  Laterality: Right;  120 mins   SKIN BIOPSY Left 12/22/2019   epidermal inclusion cyst   TONSILLECTOMY AND ADENOIDECTOMY     TOTAL HIP ARTHROPLASTY Left    TOTAL KNEE ARTHROPLASTY Right 05/31/2018   Procedure: RIGHT TOTAL KNEE ARTHROPLASTY;  Surgeon: [name], MD;  Location: [facility];  Service: Orthopedics;  Laterality: Right;  70 mins   TOTAL KNEE ARTHROPLASTY Left 06/28/2018   Procedure: LEFT TOTAL KNEE ARTHROPLASTY;  Surgeon:  [name], MD;  Location: [facility];  Service: Orthopedics;  Laterality: Left;  70 mins   WISDOM TOOTH EXTRACTION     Family History  Problem Relation Age of Onset   Uterine cancer Mother 28       Cervical cath   Crohn's disease Mother    Esophageal cancer Father    Cancer Other    Obesity Other    Social History   Socioeconomic History   Marital status: Married    Spouse name: Not on file   Number of children: 2   Years of education: Not on file   Highest education level: Not on file  Occupational History   Occupation: CONTRACTER    Employer: [employer]  Tobacco Use   Smoking status: Never   Smokeless tobacco: Never  Vaping Use   Vaping Use: Never used  Substance and Sexual Activity   Alcohol use: Yes    Comment: 2/month   Drug use: No  Sexual activity: Yes  Other Topics Concern   Not on file  Social History Narrative   Lives with spouse in [place]   Wife works for [employer] teaching program   Owns [place] Strain: Not on [employer] Insecurity: Not on file  Transportation Needs: Not on file  Physical Activity: Not on file  Stress: Not on file  Social Connections: Not on file  Intimate Partner Violence: Not on file   ROS-see HPI   + = positive Constitutional:    weight loss, night sweats, fevers, chills, fatigue, lassitude. HEENT:    headaches, difficulty swallowing, tooth/dental problems, sore throat,       sneezing, itching, ear ache, nasal congestion, post nasal drip, snoring CV:    chest pain, orthopnea, PND, swelling in lower extremities, anasarca,                                  dizziness, palpitations Resp:   shortness of breath with exertion or at rest.                productive cough,   non-productive cough, coughing up of blood.              change in color of mucus.  wheezing.   Skin:    rash or lesions. GI:  No-   heartburn, indigestion, abdominal pain, nausea,  vomiting, diarrhea,                 change in bowel habits, loss of appetite GU: dysuria, change in color of urine, no urgency or frequency.   flank pain. MS:   joint pain, stiffness, decreased range of motion, back pain. Neuro-     nothing unusual Psych:  change in mood or affect.  depression or anxiety.   memory loss.  OBJ- Physical Exam General- Alert, Oriented, Affect-appropriate, Distress- none acute, + overweight Skin- r+lower legs hyperpigmented c/w previous venous stasis Lymphadenopathy- none Head- atraumatic            Eyes- Gross vision intact, PERRLA, conjunctivae and secretions clear            Ears- Hearing, canals-normal            Nose- Clear, no-Septal dev, mucus, polyps, erosion, perforation             Throat- Mallampati II-III , mucosa clear , drainage- none, tonsils- atrophic Neck- flexible , trachea midline, no stridor , thyroid nl, carotid no bruit Chest - symmetrical excursion , unlabored           Heart/CV- RRR almost regular , no murmur , no gallop  , no rub, nl s1 s2                           - JVD- none , edema- none, stasis changes- none, varices- none           Lung- clear to P&A, wheeze- none, cough- none , dullness-none, rub- none           Chest wall-  Abd-  Br/ Gen/ Rectal- Not done, not indicated Extrem- cyanosis- none, No clubbing, none, atrophy- none, strength- nl Neuro- grossly intact to observation

## 2021-05-27 NOTE — Assessment & Plan Note (Signed)
[name] is quite old. He and his wife ask that I take over management and get machine replaced Compliance and control are very good and he believes he benefits by using it. Plan- [facility] to replace old machine VPAP IPAP 11, EPAP 8.

## 2021-05-28 ENCOUNTER — Encounter: Payer: Self-pay | Admitting: Internal Medicine

## 2021-05-28 ENCOUNTER — Inpatient Hospital Stay: Payer: 59

## 2021-05-28 ENCOUNTER — Inpatient Hospital Stay (HOSPITAL_BASED_OUTPATIENT_CLINIC_OR_DEPARTMENT_OTHER): Payer: 59 | Admitting: Internal Medicine

## 2021-05-28 ENCOUNTER — Encounter: Payer: Self-pay | Admitting: *Deleted

## 2021-05-28 VITALS — BP 111/75 | HR 80 | Temp 98.0°F | Resp 18 | Wt 259.5 lb

## 2021-05-28 DIAGNOSIS — D45 Polycythemia vera: Secondary | ICD-10-CM

## 2021-05-28 DIAGNOSIS — D751 Secondary polycythemia: Secondary | ICD-10-CM | POA: Diagnosis not present

## 2021-05-28 LAB — CBC WITH DIFFERENTIAL (CANCER CENTER ONLY)
Abs Immature Granulocytes: 0.02 10*3/uL (ref 0.00–0.07)
Basophils Absolute: 0 10*3/uL (ref 0.0–0.1)
Basophils Relative: 1 %
Eosinophils Absolute: 0.1 10*3/uL (ref 0.0–0.5)
Eosinophils Relative: 2 %
HCT: 48.1 % (ref 39.0–52.0)
Hemoglobin: 16.7 g/dL (ref 13.0–17.0)
Immature Granulocytes: 0 %
Lymphocytes Relative: 32 %
Lymphs Abs: 2 10*3/uL (ref 0.7–4.0)
MCH: 28.6 pg (ref 26.0–34.0)
MCHC: 34.7 g/dL (ref 30.0–36.0)
MCV: 82.4 fL (ref 80.0–100.0)
Monocytes Absolute: 0.6 10*3/uL (ref 0.1–1.0)
Monocytes Relative: 9 %
Neutro Abs: 3.6 10*3/uL (ref 1.7–7.7)
Neutrophils Relative %: 56 %
Platelet Count: 168 10*3/uL (ref 150–400)
RBC: 5.84 MIL/uL — ABNORMAL HIGH (ref 4.22–5.81)
RDW: 14.2 % (ref 11.5–15.5)
WBC Count: 6.4 10*3/uL (ref 4.0–10.5)
nRBC: 0 % (ref 0.0–0.2)

## 2021-05-28 NOTE — Progress Notes (Signed)
[name]. presents today for phlebotomy per MD orders.  Phlebotomy procedure started at 1446 and ended at 1455 using a 16g phlebotomy kit in [place].  511 grams removed.  IV needle removed intact.  Patient observed for 25 minutes after procedure without any incident.  Patient declined 30 minute observation period.  Patient tolerated procedure well.  Beverage provided.

## 2021-05-28 NOTE — Progress Notes (Signed)
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 Negative MPL515 mutation, Negative BCR/ABL and negative JAK-2 exon 12.  PRIOR THERAPY: None  CURRENT THERAPY: Phlebotomy on as-needed basis.  INTERVAL HISTORY: [name]. 60 y.o. male returns to the clinic today for follow-up visit.  The patient is feeling fine today with no concerning complaints.  He denied having any current chest pain, shortness of breath except with exertion with no cough or hemoptysis.  He denied having any fever or chills.  He has no nausea, vomiting, diarrhea or constipation.  He has no headache or visual changes.  He was seen by Dr. Annamaria Boots yesterday for evaluation and management of his sleep apnea.  The patient is here today for evaluation and repeat CBC.  MEDICAL HISTORY: Past Medical History:  Diagnosis Date   Arthritis    Diabetes mellitus without complication ([place])    TYPE 2   Diverticulosis    Dyslipidemia    GERD (gastroesophageal reflux disease)    Herpes labialis    Hypertension    Longstanding persistent atrial fibrillation (HCC)    Metabolic syndrome    Obesity    Proteinuria    Psoriasis    Seborrheic dermatitis    Sleep apnea    very compliant with CPAP, PT NEEDS TO BRING OWN MACHINE   TIA (transient ischemic attack) 2009    ALLERGIES:  is allergic to morphine and related.  MEDICATIONS:  Current Outpatient Medications  Medication Sig Dispense Refill   carvedilol (COREG) 25 MG tablet Take 1 tablet (25 mg total) by mouth 2 (two) times daily. Please make overdue appt for future refills. Thank you 1st attempt 60 tablet 0   clobetasol cream (TEMOVATE) 0.05 % APPLY TOPICALLY TO AFFECTED AREA TWICE DAILY. 30 g 0   doxycycline (VIBRA-TABS) 100 MG tablet Take 1 tablet  (100 mg total) by mouth 2 (two) times daily. 180 tablet 3   empagliflozin (JARDIANCE) 10 MG TABS tablet TAKE 1 TABLET BY MOUTH ONCE DAILY 90 tablet 0   glucose blood test strip 1 each by Other route as needed. Use as instructed Freestyle test stripts 100 each 3   glucose monitoring kit (FREESTYLE) monitoring kit 1 each by Does not apply route as needed. 1 each 0   Lancets (FREESTYLE) lancets 1 each by Other route as needed. Use as instructed 100 each 3   Multiple Vitamin (MULTIVITAMIN WITH MINERALS) TABS tablet Take 1 tablet by mouth daily.     mupirocin ointment (BACTROBAN) 2 % mupirocin 2 % topical ointment  APPLY TOPICALLY TO THE AFFECTED AREA ON THE SKIN TWICE DAILY.     oxymetazoline (AFRIN) 0.05 % nasal spray Place 1 spray into both nostrils at bedtime.     pantoprazole (PROTONIX) 40 MG tablet Take 1 tablet (40 mg total) by mouth 2 (two) times daily. 270 tablet 3   rivaroxaban (XARELTO) 20 MG TABS tablet TAKE 1 TABLET BY MOUTH DAILY WITH SUPPER. 90 tablet 1   rosuvastatin (CRESTOR) 10 MG tablet TAKE 1 TABLET (10 MG TOTAL) BY MOUTH DAILY. 90 tablet 2   sacubitril-valsartan (ENTRESTO) 24-26 MG TAKE 1 TABLET BY MOUTH 2 (TWO) TIMES DAILY. 180 tablet 3   TALTZ 80 MG/ML SOAJ Inject 1 ml (81m) under the skin every 4 weeks. 1 mL 8   Vitamin  D, Cholecalciferol, 1000 units CAPS Take 1,000 Units by mouth daily.      No current facility-administered medications for this visit.    SURGICAL HISTORY:  Past Surgical History:  Procedure Laterality Date   CARDIOVERSION N/A 12/02/2016   Procedure: CARDIOVERSION;  Surgeon: Pixie Casino, MD;  Location: [facility] ENDOSCOPY;  Service: Cardiovascular;  Laterality: N/A;   COLONOSCOPY     EXCISIONAL TOTAL KNEE ARTHROPLASTY WITH ANTIBIOTIC SPACERS Right 03/28/2019   Procedure: EXCISIONAL TOTAL KNEE ARTHROPLASTY WITH ANTIBIOTIC SPACERS;  Surgeon: [name], MD;  Location: [facility] ORS;  Service: Orthopedics;  Laterality: Right;  90 mins   I & D KNEE WITH POLY EXCHANGE  Right 09/10/2018   Procedure: Right Knee Arthroplasty IRRIGATION AND DEBRIDEMENT KNEE WITH POLY EXCHANGE;  Surgeon: [name], MD;  Location: [facility] ORS;  Service: Orthopedics;  Laterality: Right;   I & D KNEE WITH POLY EXCHANGE Right 06/25/2020   Procedure: Open excisional and non excisional debridement right knee, possible aspiration versus open arthrotomy poly exchange;  Surgeon: [name], MD;  Location: [facility] ORS;  Service: Orthopedics;  Laterality: Right;   IRRIGATION AND DEBRIDEMENT KNEE Right 07/09/2020   Procedure: IRRIGATION AND DEBRIDEMENT KNEE;  Surgeon: [name], MD;  Location: [facility]S;  Service: Orthopedics;  Laterality: Right;   JOINT REPLACEMENT  11/03/10   LT HIP   PFO occluder cardiac valve  2006   Dr. Einar Gip, (hole in heart)   REIMPLANTATION OF TOTAL KNEE Right 06/15/2019   Procedure: Resection of antibiotic spacer and  irrigation and debridment and placement of new antibiotic spacer and components;  Surgeon: [name], MD;  Location: [facility]S;  Service: Orthopedics;  Laterality: Right;  90 mins   REIMPLANTATION OF TOTAL KNEE Right 08/22/2019   Procedure: REIMPLANTATION OF TOTAL KNEE;  Surgeon: [name], MD;  Location: [facility];  Service: Orthopedics;  Laterality: Right;  120 mins   SKIN BIOPSY Left 12/22/2019   epidermal inclusion cyst   TONSILLECTOMY AND ADENOIDECTOMY     TOTAL HIP ARTHROPLASTY Left    TOTAL KNEE ARTHROPLASTY Right 05/31/2018   Procedure: RIGHT TOTAL KNEE ARTHROPLASTY;  Surgeon: [name], MD;  Location: [facility];  Service: Orthopedics;  Laterality: Right;  70 mins   TOTAL KNEE ARTHROPLASTY Left 06/28/2018   Procedure: LEFT TOTAL KNEE ARTHROPLASTY;  Surgeon: [name], MD;  Location: [facility];  Service: Orthopedics;  Laterality: Left;  70 mins   WISDOM TOOTH EXTRACTION      REVIEW OF SYSTEMS:  A comprehensive review of systems was negative.   PHYSICAL EXAMINATION: General appearance: alert, cooperative and no distress Head: Normocephalic,  without obvious abnormality, atraumatic Neck: no adenopathy, no JVD, supple, symmetrical, trachea midline and thyroid not enlarged, symmetric, no tenderness/mass/nodules Lymph nodes: Cervical, supraclavicular, and axillary nodes normal. Resp: clear to auscultation bilaterally Back: symmetric, no curvature. ROM normal. No CVA tenderness. Cardio: regular rate and rhythm, S1, S2 normal, no murmur, click, rub or gallop GI: soft, non-tender; bowel sounds normal; no masses,  no organomegaly Extremities: extremities normal, atraumatic, no cyanosis or edema  ECOG PERFORMANCE STATUS: 1 - Symptomatic but completely ambulatory  Pulse 80, temperature 98 F (36.7 C), resp. rate 18, weight 259 lb 8 oz (117.7 kg), SpO2 98 %.  LABORATORY DATA: Lab Results  Component Value Date   WBC 6.4 05/28/2021   HGB 16.7 05/28/2021   HCT 48.1 05/28/2021   MCV 82.4 05/28/2021   PLT 168 05/28/2021      Chemistry      Component Value  Date/Time   NA 136 04/22/2021 1533   NA 141 05/27/2020 0916   NA 138 05/22/2015 1303   K 4.5 04/22/2021 1533   K 4.1 05/22/2015 1303   CL 105 04/22/2021 1533   CO2 23 04/22/2021 1533   CO2 23 05/22/2015 1303   BUN 23 04/22/2021 1533   BUN 22 05/27/2020 0916   BUN 17.4 05/22/2015 1303   CREATININE 1.29 (H) 04/22/2021 1533   CREATININE 1.18 09/03/2020 1620   CREATININE 1.1 05/22/2015 1303      Component Value Date/Time   CALCIUM 9.3 04/22/2021 1533   CALCIUM 9.4 05/22/2015 1303   ALKPHOS 48 04/22/2021 1533   ALKPHOS 73 05/22/2015 1303   AST 20 04/22/2021 1533   AST 24 05/22/2015 1303   ALT 25 04/22/2021 1533   ALT 30 05/22/2015 1303   BILITOT 1.3 (H) 04/22/2021 1533   BILITOT 0.77 05/22/2015 1303       RADIOGRAPHIC STUDIES: DG Chest 2 View  Result Date: 05/27/2021 CLINICAL DATA:  60 year old male with history of polycythemia. EXAM: CHEST - 2 VIEW COMPARISON:  Chest x-ray 06/26/2020. FINDINGS: Lung volumes are normal. No consolidative airspace disease. No  pleural effusions. No pneumothorax. No pulmonary nodule or mass noted. Pulmonary vasculature and the cardiomediastinal silhouette are within normal limits. Atherosclerosis in the thoracic aorta. Atrial septal occluder device incidentally noted. IMPRESSION: No radiographic evidence of acute cardiopulmonary disease. Electronically Signed   By: Vinnie Langton M.D.   On: 05/27/2021 21:08    ASSESSMENT AND PLAN: This is a very pleasant 60 years old white male with polycythemia highly suspicious for polycythemia vera but with negative JAK mutation.   He has been receiving phlebotomy on as-needed basis to keep his hematocrit between 45-50%. The patient is doing fine today with no concerning complaints.  Repeat CBC today showed hematocrit of 48.1%. I recommended for the patient to proceed with phlebotomy today. I will see him back for follow-up visit in 2 weeks for evaluation and repeat CBC and phlebotomy if needed. For the sleep apnea, the patient is followed by Dr. Annamaria Boots. He was advised to call immediately if he has any concerning symptoms in the interval. The patient voices understanding of current disease status and treatment options and is in agreement with the current care plan.  All questions were answered. The patient knows to call the clinic with any problems, questions or concerns. We can certainly see the patient much sooner if necessary.   Disclaimer: This note was dictated with voice recognition software. Similar sounding words can inadvertently be transcribed and may not be corrected upon review.

## 2021-05-29 ENCOUNTER — Telehealth: Payer: Self-pay | Admitting: Internal Medicine

## 2021-05-29 NOTE — Telephone Encounter (Signed)
Scheduled appt per 9/28 los - patient is aware of appt - my chart active.

## 2021-06-01 ENCOUNTER — Other Ambulatory Visit: Payer: Self-pay | Admitting: Family Medicine

## 2021-06-01 ENCOUNTER — Other Ambulatory Visit: Payer: Self-pay | Admitting: Cardiovascular Disease

## 2021-06-02 ENCOUNTER — Other Ambulatory Visit (HOSPITAL_COMMUNITY): Payer: Self-pay

## 2021-06-02 MED ORDER — CLOBETASOL PROPIONATE 0.05 % EX CREA
TOPICAL_CREAM | Freq: Two times a day (BID) | CUTANEOUS | 0 refills | Status: DC
  Filled 2021-06-02: qty 30, 15d supply, fill #0

## 2021-06-02 NOTE — Telephone Encounter (Signed)
[facility] is requesting to fil pt clobetasol. Please advise [facility]

## 2021-06-02 NOTE — Telephone Encounter (Signed)
Message sent to scheduler to schedule appointment.

## 2021-06-02 NOTE — Telephone Encounter (Signed)
Prescription refill request for Xarelto received.  Indication: afib  Last office visit: [name], 04/01/2020 Weight: 117.7 kg  Age: 60 yo  Scr:  1.29, 04/22/2021 CrCl: 100.11 ml/min   Pt is overdue for an office visit.

## 2021-06-03 ENCOUNTER — Telehealth: Payer: Self-pay | Admitting: Cardiovascular Disease

## 2021-06-03 ENCOUNTER — Other Ambulatory Visit (HOSPITAL_COMMUNITY): Payer: Self-pay

## 2021-06-03 ENCOUNTER — Other Ambulatory Visit: Payer: Self-pay | Admitting: Cardiovascular Disease

## 2021-06-03 MED ORDER — RIVAROXABAN 20 MG PO TABS
20.0000 mg | ORAL_TABLET | Freq: Every day | ORAL | 0 refills | Status: DC
  Filled 2021-06-03: qty 90, 90d supply, fill #0

## 2021-06-03 NOTE — Telephone Encounter (Signed)
Received refill from pharmacy, refill done in Epic, see refill.

## 2021-06-03 NOTE — Telephone Encounter (Signed)
Pt last saw Dr Acie Fredrickson 04/01/20, pt is overdue for follow-up.  Last labs 04/22/21 Creat 1.29, age 60, weight 117.7kg, CrCl 100.11, based on CrCl pt is on appropriate dosage of Xarelto 20mg  QD.  Will send message to schedulers to make follow-up appt, then refill rx to get pt to upcoming appt with MD.

## 2021-06-03 NOTE — Telephone Encounter (Signed)
Pt scheduled appt with Dr Acie Fredrickson on 06/09/21.  Will refill rx to get pt to upcoming appt.

## 2021-06-03 NOTE — Telephone Encounter (Signed)
*  STAT* If patient is at the pharmacy, call can be transferred to refill team.   1. Which medications need to be refilled? (please list name of each medication and dose if known)  rivaroxaban (XARELTO) 20 MG TABS tablet  2. Which pharmacy/location (including street and city if local pharmacy) is medication to be sent to? [facility]  3. Do they need a 30 day or 90 day supply? 90 day supply   Pt has an appt with [name] on 06/09/2021

## 2021-06-05 ENCOUNTER — Ambulatory Visit: Payer: 59 | Admitting: Internal Medicine

## 2021-06-05 ENCOUNTER — Other Ambulatory Visit: Payer: 59

## 2021-06-06 NOTE — Progress Notes (Signed)
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 Negative MPL515 mutation, Negative BCR/ABL and negative JAK-2 exon 12.  PRIOR THERAPY: None  CURRENT THERAPY: Phlebotomy on as-needed basis.  INTERVAL HISTORY: [name]. 60 y.o. male returns to the clinic today for follow-up visit.  Overall the patient is feeling fine today.  He is working with Dr. Annamaria Boots for his sleep apnea and he is working on getting a new CPAP machine.  They are also planning to undergo a study to test his oxygen levels at nighttime.  He notes that his fatigue is moderate..  Denies any abnormal bleeding or bruising.  Denies any flushing.  Denies any pruritus.  Denies any abdominal discomfort or fullness.  Denies any headache, tinnitus or dizziness.Denies any chest discomfort.  The patient is here today for evaluation, repeat blood work, and consideration of a therapeutic phlebotomy.   MEDICAL HISTORY: Past Medical History:  Diagnosis Date   Arthritis    Diabetes mellitus without complication ([place])    TYPE 2   Diverticulosis    Dyslipidemia    GERD (gastroesophageal reflux disease)    Herpes labialis    Hypertension    Longstanding persistent atrial fibrillation (H[facility])    Metabolic syndrome    Obesity    Proteinuria    Psoriasis    Seborrheic dermatitis    Sleep apnea    very compliant with CPAP, PT NEEDS TO BRING OWN MACHINE   TIA (transient ischemic attack) 2009    ALLERGIES:  is allergic to morphine and related.  MEDICATIONS:  Current Outpatient Medications  Medication Sig Dispense Refill   carvedilol (COREG) 25 MG tablet Take 1 tablet (25 mg total) by mouth 2 (two) times daily. 180 tablet 3   clobetasol cream (TEMOVATE) 0.05 % APPLY TOPICALLY TO AFFECTED AREA TWICE DAILY. 30 g 0   doxycycline (VIBRA-TABS) 100 MG tablet Take 1  tablet (100 mg total) by mouth 2 (two) times daily. 180 tablet 3   empagliflozin (JARDIANCE) 10 MG TABS tablet TAKE 1 TABLET BY MOUTH ONCE DAILY 90 tablet 0   glucose blood test strip 1 each by Other route as needed. Use as instructed Freestyle test stripts 100 each 3   glucose monitoring kit (FREESTYLE) monitoring kit 1 each by Does not apply route as needed. 1 each 0   Lancets (FREESTYLE) lancets 1 each by Other route as needed. Use as instructed 100 each 3   Multiple Vitamin (MULTIVITAMIN WITH MINERALS) TABS tablet Take 1 tablet by mouth daily.     mupirocin ointment (BACTROBAN) 2 % mupirocin 2 % topical ointment  APPLY TOPICALLY TO THE AFFECTED AREA ON THE SKIN TWICE DAILY.     Oxymetazoline HCl (NASAL SPRAY) 0.05 % SOLN Place into the nose.     pantoprazole (PROTONIX) 40 MG tablet Take 1 tablet (40 mg total) by mouth 2 (two) times daily. 270 tablet 3   rivaroxaban (XARELTO) 20 MG TABS tablet Take 1 tablet (20 mg total) by mouth daily with supper. Overdue for follow-up, MUST see MD for FUTURE refills. 90 tablet 0   rosuvastatin (CRESTOR) 10 MG tablet TAKE 1 TABLET (10 MG TOTAL) BY MOUTH DAILY. 90 tablet 2   sacubitril-valsartan (ENTRESTO) 24-26 MG TAKE 1 TABLET BY MOUTH 2 (TWO) TIMES DAILY. 180 tablet 3   TALTZ 80 MG/ML SOAJ Inject 1 ml ($Rem'80mg'JpxY$ ) under the skin every  4 weeks. 1 mL 8   Vitamin D, Cholecalciferol, 1000 units CAPS Take 1,000 Units by mouth daily.      No current facility-administered medications for this visit.    SURGICAL HISTORY:  Past Surgical History:  Procedure Laterality Date   CARDIOVERSION N/A 12/02/2016   Procedure: CARDIOVERSION;  Surgeon: Pixie Casino, MD;  Location: [facility] ENDOSCOPY;  Service: Cardiovascular;  Laterality: N/A;   COLONOSCOPY     EXCISIONAL TOTAL KNEE ARTHROPLASTY WITH ANTIBIOTIC SPACERS Right 03/28/2019   Procedure: EXCISIONAL TOTAL KNEE ARTHROPLASTY WITH ANTIBIOTIC SPACERS;  Surgeon: [name], MD;  Location: [facility] ORS;  Service: Orthopedics;   Laterality: Right;  90 mins   I & D KNEE WITH POLY EXCHANGE Right 09/10/2018   Procedure: Right Knee Arthroplasty IRRIGATION AND DEBRIDEMENT KNEE WITH POLY EXCHANGE;  Surgeon: [name], MD;  Location: [facility] ORS;  Service: Orthopedics;  Laterality: Right;   I & D KNEE WITH POLY EXCHANGE Right 06/25/2020   Procedure: Open excisional and non excisional debridement right knee, possible aspiration versus open arthrotomy poly exchange;  Surgeon: [name], MD;  Location: [facility];  Service: Orthopedics;  Laterality: Right;   IRRIGATION AND DEBRIDEMENT KNEE Right 07/09/2020   Procedure: IRRIGATION AND DEBRIDEMENT KNEE;  Surgeon: [name], MD;  Location: [facility];  Service: Orthopedics;  Laterality: Right;   JOINT REPLACEMENT  11/03/10   LT HIP   PFO occluder cardiac valve  2006   Dr. Einar Gip, (hole in heart)   REIMPLANTATION OF TOTAL KNEE Right 06/15/2019   Procedure: Resection of antibiotic spacer and  irrigation and debridment and placement of new antibiotic spacer and components;  Surgeon: [name], MD;  Location: [facility];  Service: Orthopedics;  Laterality: Right;  90 mins   REIMPLANTATION OF TOTAL KNEE Right 08/22/2019   Procedure: REIMPLANTATION OF TOTAL KNEE;  Surgeon: [name], MD;  Location: [facility];  Service: Orthopedics;  Laterality: Right;  120 mins   SKIN BIOPSY Left 12/22/2019   epidermal inclusion cyst   TONSILLECTOMY AND ADENOIDECTOMY     TOTAL HIP ARTHROPLASTY Left    TOTAL KNEE ARTHROPLASTY Right 05/31/2018   Procedure: RIGHT TOTAL KNEE ARTHROPLASTY;  Surgeon: [name], MD;  Location: [facility];  Service: Orthopedics;  Laterality: Right;  70 mins   TOTAL KNEE ARTHROPLASTY Left 06/28/2018   Procedure: LEFT TOTAL KNEE ARTHROPLASTY;  Surgeon: [name], MD;  Location: [facility];  Service: Orthopedics;  Laterality: Left;  70 mins   WISDOM TOOTH EXTRACTION      REVIEW OF SYSTEMS:   Review of Systems  Constitutional: Positive for fatigue.  Negative for appetite change,  chills, fever and unexpected weight change.  HENT:   Negative for mouth sores, nosebleeds, sore throat and trouble swallowing.   Eyes: Negative for eye problems and icterus.  Respiratory: Negative for cough, hemoptysis, shortness of breath and wheezing.   Cardiovascular: Negative for chest pain and leg swelling.  Gastrointestinal: Negative for abdominal pain, constipation, diarrhea, nausea and vomiting.  Genitourinary: Negative for bladder incontinence, difficulty urinating, dysuria, frequency and hematuria.   Musculoskeletal: Negative for back pain, gait problem, neck pain and neck stiffness.  Skin: Negative for itching and rash.  Neurological: Negative for dizziness, extremity weakness, gait problem, headaches, light-headedness and seizures.  Hematological: Negative for adenopathy. Does not bruise/bleed easily.  Psychiatric/Behavioral: Negative for confusion, depression and sleep disturbance. The patient is not nervous/anxious.     PHYSICAL EXAMINATION:  Blood pressure 104/77, pulse 84, temperature 98 F (36.7 C), temperature source Tympanic, resp.  rate 18, weight 262 lb 9 oz (119.1 kg), SpO2 100 %.  ECOG PERFORMANCE STATUS: 1  Physical Exam  Constitutional: Oriented to person, place, and time and well-developed, well-nourished, and in no distress. HENT:  Head: Normocephalic and atraumatic.  Mouth/Throat: Oropharynx is clear and moist. No oropharyngeal exudate.  Eyes: Conjunctivae are normal. Right eye exhibits no discharge. Left eye exhibits no discharge. No scleral icterus.  Neck: Normal range of motion. Neck supple.  Cardiovascular: Normal rate, regular rhythm, normal heart sounds and intact distal pulses.   Pulmonary/Chest: Effort normal and breath sounds normal. No respiratory distress. No wheezes. No rales.  Abdominal: Soft. Bowel sounds are normal. Exhibits no distension and no mass. There is no tenderness.  Musculoskeletal: Normal range of motion. Exhibits no edema.   Lymphadenopathy:    No cervical adenopathy.  Neurological: Alert and oriented to person, place, and time. Exhibits normal muscle tone. Gait normal. Coordination normal.  Skin: Skin is warm and dry. No rash noted. Not diaphoretic. No erythema. No pallor.  Psychiatric: Mood, memory and judgment normal.  Vitals reviewed.  LABORATORY DATA: Lab Results  Component Value Date   WBC 6.2 06/12/2021   HGB 15.7 06/12/2021   HCT 44.8 06/12/2021   MCV 79.2 (L) 06/12/2021   PLT 186 06/12/2021      Chemistry      Component Value Date/Time   NA 136 04/22/2021 1533   NA 141 05/27/2020 0916   NA 138 05/22/2015 1303   K 4.5 04/22/2021 1533   K 4.1 05/22/2015 1303   CL 105 04/22/2021 1533   CO2 23 04/22/2021 1533   CO2 23 05/22/2015 1303   BUN 23 04/22/2021 1533   BUN 22 05/27/2020 0916   BUN 17.4 05/22/2015 1303   CREATININE 1.29 (H) 04/22/2021 1533   CREATININE 1.18 09/03/2020 1620   CREATININE 1.1 05/22/2015 1303      Component Value Date/Time   CALCIUM 9.3 04/22/2021 1533   CALCIUM 9.4 05/22/2015 1303   ALKPHOS 48 04/22/2021 1533   ALKPHOS 73 05/22/2015 1303   AST 20 04/22/2021 1533   AST 24 05/22/2015 1303   ALT 17 06/09/2021 1019   ALT 25 04/22/2021 1533   ALT 30 05/22/2015 1303   BILITOT 1.3 (H) 04/22/2021 1533   BILITOT 0.77 05/22/2015 1303       RADIOGRAPHIC STUDIES:  DG Chest 2 View  Result Date: 05/27/2021 CLINICAL DATA:  60 year old male with history of polycythemia. EXAM: CHEST - 2 VIEW COMPARISON:  Chest x-ray 06/26/2020. FINDINGS: Lung volumes are normal. No consolidative airspace disease. No pleural effusions. No pneumothorax. No pulmonary nodule or mass noted. Pulmonary vasculature and the cardiomediastinal silhouette are within normal limits. Atherosclerosis in the thoracic aorta. Atrial septal occluder device incidentally noted. IMPRESSION: No radiographic evidence of acute cardiopulmonary disease. Electronically Signed   By: Vinnie Langton M.D.   On:  05/27/2021 21:08     ASSESSMENT/PLAN:  This is a very pleasant 60 year old Caucasian male with polycythemia highly suggestive for polycythemia vera but with a negative Jak 2 mutation.  He has been receiving phlebotomies on an as-needed basis to keep his hematocrit between 45 to 50%.  The patient is doing fine today without any concerning complaints.  Repeat CBC today shows a hematocrit of 44.8.  Dr. Julien Nordmann does not recommend a phlebotomy today.   We will see him back for follow-up visit in 1 month for evaluation and repeat CBC and phlebotomy if needed.  Continue to follow with Dr. Annamaria Boots for sleep  apnea.  The patient was advised to call immediately if she has any concerning symptoms in the interval. The patient voices understanding of current disease status and treatment options and is in agreement with the current care plan. All questions were answered. The patient knows to call the clinic with any problems, questions or concerns. We can certainly see the patient much sooner if necessary      Orders Placed This Encounter  Procedures   CBC with Differential ([facility] Only)    Standing Status:   Future    Standing Expiration Date:   06/12/2022      Tobe Sos Kerby Borner, PA-C 06/12/21  ADDENDUM: Hematology/Oncology Attending: I had a face-to-face encounter with the patient today.  I reviewed his record, lab and recommended his care plan.  This is a very pleasant 60 years old white male with polycythemia highly suggestive of polycythemia vera but has negative JAK2 mutation.  It could be also reactive polycythemia secondary to sleep apnea. The patient is currently under evaluation by Dr. Annamaria Boots for his sleep apnea. He has been undergoing phlebotomy on as-needed basis for his polycythemia. Repeat CBC today showed hematocrit of 44.8%.  I would not recommend for the patient to receive phlebotomy today.  We will see him back for follow-up visit in 1 month for evaluation and  repeat blood work. The patient was advised to call immediately if he has any other concerning symptoms in the interval. Disclaimer: This note was dictated with voice recognition software. Similar sounding words can inadvertently be transcribed and may be missed upon review. Eilleen Kempf, MD 06/12/21

## 2021-06-08 ENCOUNTER — Encounter: Payer: Self-pay | Admitting: Cardiovascular Disease

## 2021-06-08 NOTE — Progress Notes (Signed)
Cardiology Office Note:    Date:  06/09/2021   ID:  [name]., DOB 01-Feb-1961, MRN [id number]  PCP:  William Lung, MD  Cardiologist:  [name]   / [name]  Electrophysiologist:  None   Referring MD: William Lung, MD   1.   PFO closure 2.  Atrial fib 3.   Chronic systolic CHF   Chief Complaint  Patient presents with   Atrial Fibrillation   Hypertension     Jan.  6, 2020    [name]. is a 60 y.o. male with a hx of persistent atrial fibrillation, dyslipidemia, obstructive sleep apnea He has been seen in the past by Dr. Donnella Delgado.  He is seen currently by Dr. Thompson Delgado and was furred to me for general cardiology issues.  Hx of HTN, PFO - s/p occlusion device.   Hx of CHF  Hx of OSA    Seen with his wife , [name]  Has seen Dr. Einar Delgado in the past  Has a PFO occlusion device following a TIA   Has had bilateral knee replacements ,  Both in Oct. 2019.   Dr. Rayann Delgado increased his Coreg to 25  BID ,  Has some fatigue related to this increase  Tolerated the lower dose better .   Is on crutches today ,  Typically gets 7000-8000 steps a day   No CP, no dyspnea,  No sweats, no  PND.  No fever, rash, Sleeps well with CPAP.    Works as a Clinical biochemist   Oncologist and [place] )  His son played [place] with my son   Feb. 24, 2020:  [name] is seen today for follow-up of his atrial fibrillation, dyslipidemia struct of sleep apnea.  Has had bilateral knee replacement  Has has an infected right  knee joint ( MSSA ) .   Had a right knee poly exchange  Had 6 weeks of Ancef.     Is on Rifampin for 6 months  Is on Lovenox for now .   Does not want to do coumadin  Doesn't think he wants to go through rehab for his last knee surgery  Has been started on Jardiance. Has lost 50 lbs since Oct.   September 27, 2019: [name] is seen back today for his follow-up of his atrial fibrillation, dyslipidemia, and his chronic systolic congestive heart failure.  Wt is  275 lbs .   Has continued to have issues with his infected right knee prosthesis  Has had 5 operations on his right knee.   Had his 3rd R TKA . He is feeling better.   Is back on xarelto   Still doing general contracting     April 01, 2020: [name] is seen today for follow-up of his atrial fibrillation, dyslipidemia, chronic systolic congestive heart failure. Weight is 278 pounds No complaints.  No CP, no dyspnea. Walks 7000 steps a day .  Prosthetic R knee issues - slowly improving.    Still has fluid around his knee.  Still on Abx.  Labs reveiewd  - LDL is great - 30 Trigs are 228. Is on a keto diet . Has lost 15 lbs recently .      Oct. 2022:89 Seen with his wife, [name].   [name] is seen today for follow up of his atrial fib, dyslipidemia , chronic systolic CHF Has OSA, wears his CPAP.  Has thrombocytosis  Is seeing oncology    sees Dr. Annamaria Delgado ( pulmonary )  S/p PFO closure  Wt = 261 lbs - has lost 30-llbs over the year  Has had multiple R knee surgeries - all from a persistent infection that has not cleared  Is back walking some ,   does normal activity .  Advised more cycling compared to walking  No CP , no syncope .  Occasional dizziness ( associated with blurred vision ) improves with some glucose,   he thinks its due to hypoglycemia   Has moderate LV dysfunction  - EF 35-40%.    Past Medical History:  Diagnosis Date   Arthritis    Diabetes mellitus without complication ([place])    TYPE 2   Diverticulosis    Dyslipidemia    GERD (gastroesophageal reflux disease)    Herpes labialis    Hypertension    Longstanding persistent atrial fibrillation (HCC)    Metabolic syndrome    Obesity    Proteinuria    Psoriasis    Seborrheic dermatitis    Sleep apnea    very compliant with CPAP, PT NEEDS TO BRING OWN MACHINE   TIA (transient ischemic attack) 2009    Past Surgical History:  Procedure Laterality Date   CARDIOVERSION N/A 12/02/2016   Procedure: CARDIOVERSION;   Surgeon: Pixie Casino, MD;  Location: [facility];  Service: Cardiovascular;  Laterality: N/A;   COLONOSCOPY     EXCISIONAL TOTAL KNEE ARTHROPLASTY WITH ANTIBIOTIC SPACERS Right 03/28/2019   Procedure: EXCISIONAL TOTAL KNEE ARTHROPLASTY WITH ANTIBIOTIC SPACERS;  Surgeon: [name], MD;  [facility];  Service: Orthopedics;  Laterality: Right;  90 mins   I & D KNEE WITH POLY EXCHANGE Right 09/10/2018   Procedure: Right Knee Arthroplasty IRRIGATION AND DEBRIDEMENT KNEE WITH POLY EXCHANGE;  Surgeon: [name], MD;  [facility];  Service: Orthopedics;  Laterality: Right;   I & D KNEE WITH POLY EXCHANGE Right 06/25/2020   Procedure: Open excisional and non excisional debridement right knee, possible aspiration versus open arthrotomy poly exchange;  Surgeon: [name], MD;  [facility];  Service: Orthopedics;  Laterality: Right;   IRRIGATION AND DEBRIDEMENT KNEE Right 07/09/2020   Procedure: IRRIGATION AND DEBRIDEMENT KNEE;  Surgeon: [name], MD;  [facility];  Service: Orthopedics;  Laterality: Right;   JOINT REPLACEMENT  11/03/10   LT HIP   PFO occluder cardiac valve  2006   Dr. Einar Delgado, (hole in heart)   REIMPLANTATION OF TOTAL KNEE Right 06/15/2019   Procedure: Resection of antibiotic spacer and  irrigation and debridment and placement of new antibiotic spacer and components;  Surgeon: [name], MD;  [facility];  Service: Orthopedics;  Laterality: Right;  90 mins   REIMPLANTATION OF TOTAL KNEE Right 08/22/2019   Procedure: REIMPLANTATION OF TOTAL KNEE;  Surgeon: [name], MD;  [facility];  Service: Orthopedics;  Laterality: Right;  120 mins   SKIN BIOPSY Left 12/22/2019   epidermal inclusion cyst   TONSILLECTOMY AND ADENOIDECTOMY     TOTAL HIP ARTHROPLASTY Left    TOTAL KNEE ARTHROPLASTY Right 05/31/2018   Procedure: RIGHT TOTAL KNEE ARTHROPLASTY;  Surgeon: [name], MD;  [facility];  Service: Orthopedics;  Laterality: Right;  70  mins   TOTAL KNEE ARTHROPLASTY Left 06/28/2018   Procedure: LEFT TOTAL KNEE ARTHROPLASTY;  Surgeon: [name], MD;  [facility];  Service: Orthopedics;  Laterality: Left;  70 mins   WISDOM TOOTH EXTRACTION      Current Medications: Current Meds  Medication Sig   clobetasol cream (  TEMOVATE) 0.05 % APPLY TOPICALLY TO AFFECTED AREA TWICE DAILY.   doxycycline (VIBRA-TABS) 100 MG tablet Take 1 tablet (100 mg total) by mouth 2 (two) times daily.   empagliflozin (JARDIANCE) 10 MG TABS tablet TAKE 1 TABLET BY MOUTH ONCE DAILY   glucose blood test strip 1 each by Other route as needed. Use as instructed Freestyle test stripts   glucose monitoring kit (FREESTYLE) monitoring kit 1 each by Does not apply route as needed.   Lancets (FREESTYLE) lancets 1 each by Other route as needed. Use as instructed   Multiple Vitamin (MULTIVITAMIN WITH MINERALS) TABS tablet Take 1 tablet by mouth daily.   mupirocin ointment (BACTROBAN) 2 % mupirocin 2 % topical ointment  APPLY TOPICALLY TO THE AFFECTED AREA ON THE SKIN TWICE DAILY.   Oxymetazoline HCl (NASAL SPRAY) 0.05 % SOLN Place into the nose.   pantoprazole (PROTONIX) 40 MG tablet Take 1 tablet (40 mg total) by mouth 2 (two) times daily.   rivaroxaban (XARELTO) 20 MG TABS tablet Take 1 tablet (20 mg total) by mouth daily with supper. Overdue for follow-up, MUST see MD for FUTURE refills.   rosuvastatin (CRESTOR) 10 MG tablet TAKE 1 TABLET (10 MG TOTAL) BY MOUTH DAILY.   sacubitril-valsartan (ENTRESTO) 24-26 MG TAKE 1 TABLET BY MOUTH 2 (TWO) TIMES DAILY.   TALTZ 80 MG/ML SOAJ Inject 1 ml (71m) under the skin every 4 weeks.   Vitamin D, Cholecalciferol, 1000 units CAPS Take 1,000 Units by mouth daily.    [DISCONTINUED] carvedilol (COREG) 25 MG tablet Take 1 tablet (25 mg total) by mouth 2 (two) times daily. Please make overdue appt for future refills. Thank you 1st attempt     Allergies:   Morphine and related   Social History   Socioeconomic  History   Marital status: Married    Spouse name: Not on file   Number of children: 2   Years of education: Not on file   Highest education level: Not on file  Occupational History   Occupation: CONTRACTER    Employer: [employer] Tobacco Use   Smoking status: Never   Smokeless tobacco: Never  Vaping Use   Vaping Use: Never used  Substance and Sexual Activity   Alcohol use: Yes    Comment: 2/month   Drug use: No   Sexual activity: Yes  Other Topics Concern   Not on file  Social History Narrative   [place]  Wife works for [employer]   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  ROS:   Please see the history of present illness.     All other systems reviewed and are negative.  EKGs/Labs/Other Studies Reviewed:    The following studies were reviewed today:   EKG:     Recent Labs: 04/22/2021: BUN 23; Creatinine 1.29; Potassium 4.5; Sodium 136 05/28/2021: Hemoglobin 16.7; Platelet Count 168 06/09/2021: ALT 17  Recent Lipid Panel    Component Value Date/Time   CHOL 102 06/09/2021 1019  TRIG 120 06/09/2021 1019   HDL 42 06/09/2021 1019   CHOLHDL 2.4 06/09/2021 1019   CHOLHDL 4.1 10/13/2016 0956   VLDL 65 (H) 10/13/2016 0956   LDLCALC 38 06/09/2021 1019    Physical Exam:     Physical Exam: Blood pressure 104/66, pulse 68, height 5' 10"  (1.778 m), weight 261 lb 9.6 oz (118.7 kg).  GEN:  Well nourished, well developed in no acute distress HEENT: Normal NECK: No  JVD; No carotid bruits LYMPHATICS: No lymphadenopathy CARDIAC: irreg. Irreg.  RESPIRATORY:  Clear to auscultation without rales, wheezing or rhonchi  ABDOMEN: Soft, non-tender, non-distended MUSCULOSKELETAL:  No edema; No deformity  SKIN: Warm and dry NEUROLOGIC:  Alert and oriented x 3    ASSESSMENT:    1. Chronic systolic CHF (congestive heart failure) ([place])   2. Permanent atrial fibrillation ([place])   3. Hyperlipidemia associated with type 2 diabetes mellitus ([place])    PLAN:    In order of problems listed above:  Chronic systolic congestive heart failure:   BP is on the low side but he has no symptoms of syncope or presyncope   Cont meds.   2.  Atrial fibrillation:  stable , cont DOAC     3.  Hyperlipidemia: labs are stable Labs today look much improved This SmartLink has not been configured with any valid records.   Lab Results  Component Value Date   CHOL 102 06/09/2021   HDL 42 06/09/2021   LDLCALC 38 06/09/2021   TRIG 120 06/09/2021   CHOLHDL 2.4 06/09/2021    Cont current meds    Medication Adjustments/Labs and Tests Ordered: Current medicines are reviewed at length with the patient today.  Concerns regarding medicines are outlined above.  Orders Placed This Encounter  Procedures   Lipid panel   ALT   EKG 12-Lead    Meds ordered this encounter  Medications   carvedilol (COREG) 25 MG tablet    Sig: Take 1 tablet (25 mg total) by mouth 2 (two) times daily.    Dispense:  180 tablet    Refill:  3     Patient Instructions  Medication Instructions:  NO CHANGES *If you need a refill on your cardiac medications before your next appointment, please call your pharmacy*   Lab Work: TODAY LIPID ALT If you have labs (blood work) drawn today and your tests are completely normal, you will receive your results only by: [place] (if you have MyChart) OR A paper copy in the mail If you have any lab test that is abnormal or we need to change your  treatment, we will call you to review the results.   Testing/Procedures: NONE   Follow-Up: At [facility], you and your health needs are our priority.  As part of our continuing mission to provide you with exceptional heart care, we have created designated Provider Care Teams.  These Care Teams include your primary Cardiologist (physician) and Advanced Practice Providers (APPs -  Physician Assistants and Nurse Practitioners) who all work together to provide you with the care you need, when you need it.  We recommend signing up for the patient portal called "MyChart".  Sign up information is provided on this After Visit Summary.  MyChart is used to connect with patients for Virtual Visits (Telemedicine).  Patients are able to view lab/test results, encounter notes, upcoming appointments, etc.  Non-urgent messages can be sent to your provider as well.   To learn more about what you can do with MyChart,  go to [email or web address].    Your next appointment:   12 month(s)  The format for your next appointment:   In Person  Provider:   WITH DR Cathie Olden OR APP   Other Instructions NONE   Signed, Mertie Moores, MD  06/09/2021 5:27 PM    [facility]

## 2021-06-09 ENCOUNTER — Ambulatory Visit: Payer: 59 | Admitting: Cardiovascular Disease

## 2021-06-09 ENCOUNTER — Other Ambulatory Visit (HOSPITAL_COMMUNITY): Payer: Self-pay

## 2021-06-09 ENCOUNTER — Other Ambulatory Visit: Payer: Self-pay

## 2021-06-09 ENCOUNTER — Encounter: Payer: Self-pay | Admitting: Cardiovascular Disease

## 2021-06-09 VITALS — BP 104/66 | HR 68 | Ht 70.0 in | Wt 261.6 lb

## 2021-06-09 DIAGNOSIS — I5022 Chronic systolic (congestive) heart failure: Secondary | ICD-10-CM

## 2021-06-09 DIAGNOSIS — I4821 Permanent atrial fibrillation: Secondary | ICD-10-CM

## 2021-06-09 DIAGNOSIS — E785 Hyperlipidemia, unspecified: Secondary | ICD-10-CM

## 2021-06-09 DIAGNOSIS — E1169 Type 2 diabetes mellitus with other specified complication: Secondary | ICD-10-CM | POA: Diagnosis not present

## 2021-06-09 LAB — LIPID PANEL
Chol/HDL Ratio: 2.4 ratio (ref 0.0–5.0)
Cholesterol, Total: 102 mg/dL (ref 100–199)
HDL: 42 mg/dL (ref >39)
LDL Chol Calc (NIH): 38 mg/dL (ref 0–99)
Triglycerides: 120 mg/dL (ref 0–149)
VLDL Cholesterol Cal: 22 mg/dL (ref 5–40)

## 2021-06-09 LAB — ALT: ALT: 17 IU/L (ref 0–44)

## 2021-06-09 MED ORDER — CARVEDILOL 25 MG PO TABS
25.0000 mg | ORAL_TABLET | Freq: Two times a day (BID) | ORAL | 3 refills | Status: DC
  Filled 2021-06-09: qty 180, 90d supply, fill #0
  Filled 2021-09-17: qty 180, 90d supply, fill #1
  Filled 2021-12-02: qty 180, 90d supply, fill #2
  Filled 2022-02-25: qty 180, 90d supply, fill #3

## 2021-06-09 NOTE — Patient Instructions (Addendum)
Medication Instructions:  NO CHANGES *If you need a refill on your cardiac medications before your next appointment, please call your pharmacy*   Lab Work: TODAY LIPID ALT If you have labs (blood work) drawn today and your tests are completely normal, you will receive your results only by: [place] (if you have MyChart) OR A paper copy in the mail If you have any lab test that is abnormal or we need to change your treatment, we will call you to review the results.   Testing/Procedures: NONE   Follow-Up: At [facility], you and your health needs are our priority.  As part of our continuing mission to provide you with exceptional heart care, we have created designated Provider Care Teams.  These Care Teams include your primary Cardiologist (physician) and Advanced Practice Providers (APPs -  Physician Assistants and Nurse Practitioners) who all work together to provide you with the care you need, when you need it.  We recommend signing up for the patient portal called "MyChart".  Sign up information is provided on this After Visit Summary.  MyChart is used to connect with patients for Virtual Visits (Telemedicine).  Patients are able to view lab/test results, encounter notes, upcoming appointments, etc.  Non-urgent messages can be sent to your provider as well.   To learn more about what you can do with MyChart, go to [email or web address].    Your next appointment:   12 month(s)  The format for your next appointment:   In Person  Provider:   WITH DR Cathie Olden OR APP   Other Instructions NONE

## 2021-06-12 ENCOUNTER — Inpatient Hospital Stay: Payer: 59

## 2021-06-12 ENCOUNTER — Other Ambulatory Visit: Payer: Self-pay

## 2021-06-12 ENCOUNTER — Inpatient Hospital Stay: Payer: 59 | Attending: Internal Medicine | Admitting: Physician Assistant

## 2021-06-12 VITALS — BP 104/77 | HR 84 | Temp 98.0°F | Resp 18 | Wt 262.6 lb

## 2021-06-12 DIAGNOSIS — D45 Polycythemia vera: Secondary | ICD-10-CM | POA: Diagnosis not present

## 2021-06-12 DIAGNOSIS — G4730 Sleep apnea, unspecified: Secondary | ICD-10-CM | POA: Insufficient documentation

## 2021-06-12 DIAGNOSIS — D751 Secondary polycythemia: Secondary | ICD-10-CM | POA: Diagnosis not present

## 2021-06-12 LAB — CBC WITH DIFFERENTIAL (CANCER CENTER ONLY)
Abs Immature Granulocytes: 0.03 10*3/uL (ref 0.00–0.07)
Basophils Absolute: 0 10*3/uL (ref 0.0–0.1)
Basophils Relative: 1 %
Eosinophils Absolute: 0.1 10*3/uL (ref 0.0–0.5)
Eosinophils Relative: 2 %
HCT: 44.8 % (ref 39.0–52.0)
Hemoglobin: 15.7 g/dL (ref 13.0–17.0)
Immature Granulocytes: 1 %
Lymphocytes Relative: 36 %
Lymphs Abs: 2.3 10*3/uL (ref 0.7–4.0)
MCH: 27.7 pg (ref 26.0–34.0)
MCHC: 35 g/dL (ref 30.0–36.0)
MCV: 79.2 fL — ABNORMAL LOW (ref 80.0–100.0)
Monocytes Absolute: 0.5 10*3/uL (ref 0.1–1.0)
Monocytes Relative: 9 %
Neutro Abs: 3.3 10*3/uL (ref 1.7–7.7)
Neutrophils Relative %: 51 %
Platelet Count: 186 10*3/uL (ref 150–400)
RBC: 5.66 MIL/uL (ref 4.22–5.81)
RDW: 14.5 % (ref 11.5–15.5)
WBC Count: 6.2 10*3/uL (ref 4.0–10.5)
nRBC: 0 % (ref 0.0–0.2)

## 2021-06-13 ENCOUNTER — Telehealth: Payer: Self-pay | Admitting: Physician Assistant

## 2021-06-13 NOTE — Telephone Encounter (Signed)
Left message with follow-up appointment per 10/13 los. 

## 2021-06-18 DIAGNOSIS — G4733 Obstructive sleep apnea (adult) (pediatric): Secondary | ICD-10-CM | POA: Diagnosis not present

## 2021-06-19 ENCOUNTER — Other Ambulatory Visit (HOSPITAL_COMMUNITY): Payer: Self-pay

## 2021-06-22 ENCOUNTER — Other Ambulatory Visit: Payer: Self-pay | Admitting: Family Medicine

## 2021-06-22 DIAGNOSIS — E1121 Type 2 diabetes mellitus with diabetic nephropathy: Secondary | ICD-10-CM

## 2021-06-22 MED FILL — Sacubitril-Valsartan Tab 24-26 MG: ORAL | 90 days supply | Qty: 180 | Fill #2 | Status: AC

## 2021-06-23 ENCOUNTER — Other Ambulatory Visit (HOSPITAL_COMMUNITY): Payer: Self-pay

## 2021-06-23 MED ORDER — EMPAGLIFLOZIN 10 MG PO TABS
10.0000 mg | ORAL_TABLET | Freq: Every day | ORAL | 0 refills | Status: DC
  Filled 2021-06-23: qty 90, 90d supply, fill #0

## 2021-07-13 ENCOUNTER — Other Ambulatory Visit: Payer: Self-pay | Admitting: Family Medicine

## 2021-07-13 DIAGNOSIS — E1169 Type 2 diabetes mellitus with other specified complication: Secondary | ICD-10-CM

## 2021-07-13 DIAGNOSIS — E785 Hyperlipidemia, unspecified: Secondary | ICD-10-CM

## 2021-07-14 ENCOUNTER — Ambulatory Visit (INDEPENDENT_AMBULATORY_CARE_PROVIDER_SITE_OTHER): Payer: 59

## 2021-07-14 ENCOUNTER — Inpatient Hospital Stay: Payer: 59

## 2021-07-14 ENCOUNTER — Other Ambulatory Visit (HOSPITAL_COMMUNITY): Payer: Self-pay

## 2021-07-14 ENCOUNTER — Other Ambulatory Visit: Payer: Self-pay

## 2021-07-14 ENCOUNTER — Encounter: Payer: Self-pay | Admitting: Internal Medicine

## 2021-07-14 ENCOUNTER — Inpatient Hospital Stay: Payer: 59 | Attending: Internal Medicine | Admitting: Internal Medicine

## 2021-07-14 ENCOUNTER — Ambulatory Visit: Payer: 59 | Admitting: Internal Medicine

## 2021-07-14 VITALS — BP 111/71 | HR 93 | Temp 98.0°F | Resp 20 | Ht 70.0 in | Wt 264.4 lb

## 2021-07-14 VITALS — BP 114/77 | HR 71 | Temp 97.9°F | Resp 16 | Ht 70.0 in | Wt 262.0 lb

## 2021-07-14 DIAGNOSIS — T8459XD Infection and inflammatory reaction due to other internal joint prosthesis, subsequent encounter: Secondary | ICD-10-CM

## 2021-07-14 DIAGNOSIS — Z7984 Long term (current) use of oral hypoglycemic drugs: Secondary | ICD-10-CM | POA: Insufficient documentation

## 2021-07-14 DIAGNOSIS — Z7901 Long term (current) use of anticoagulants: Secondary | ICD-10-CM | POA: Diagnosis not present

## 2021-07-14 DIAGNOSIS — Z23 Encounter for immunization: Secondary | ICD-10-CM

## 2021-07-14 DIAGNOSIS — I10 Essential (primary) hypertension: Secondary | ICD-10-CM | POA: Insufficient documentation

## 2021-07-14 DIAGNOSIS — G4730 Sleep apnea, unspecified: Secondary | ICD-10-CM | POA: Insufficient documentation

## 2021-07-14 DIAGNOSIS — Z789 Other specified health status: Secondary | ICD-10-CM

## 2021-07-14 DIAGNOSIS — Z8673 Personal history of transient ischemic attack (TIA), and cerebral infarction without residual deficits: Secondary | ICD-10-CM | POA: Insufficient documentation

## 2021-07-14 DIAGNOSIS — D45 Polycythemia vera: Secondary | ICD-10-CM

## 2021-07-14 DIAGNOSIS — H5203 Hypermetropia, bilateral: Secondary | ICD-10-CM | POA: Diagnosis not present

## 2021-07-14 DIAGNOSIS — D751 Secondary polycythemia: Secondary | ICD-10-CM | POA: Insufficient documentation

## 2021-07-14 DIAGNOSIS — Z96659 Presence of unspecified artificial knee joint: Secondary | ICD-10-CM

## 2021-07-14 DIAGNOSIS — E119 Type 2 diabetes mellitus without complications: Secondary | ICD-10-CM | POA: Diagnosis not present

## 2021-07-14 DIAGNOSIS — Z79899 Other long term (current) drug therapy: Secondary | ICD-10-CM | POA: Insufficient documentation

## 2021-07-14 DIAGNOSIS — A4901 Methicillin susceptible Staphylococcus aureus infection, unspecified site: Secondary | ICD-10-CM | POA: Diagnosis not present

## 2021-07-14 LAB — CBC WITH DIFFERENTIAL (CANCER CENTER ONLY)
Abs Immature Granulocytes: 0.02 10*3/uL (ref 0.00–0.07)
Basophils Absolute: 0 10*3/uL (ref 0.0–0.1)
Basophils Relative: 1 %
Eosinophils Absolute: 0.1 10*3/uL (ref 0.0–0.5)
Eosinophils Relative: 1 %
HCT: 45.1 % (ref 39.0–52.0)
Hemoglobin: 15.4 g/dL (ref 13.0–17.0)
Immature Granulocytes: 0 %
Lymphocytes Relative: 32 %
Lymphs Abs: 2 10*3/uL (ref 0.7–4.0)
MCH: 26.2 pg (ref 26.0–34.0)
MCHC: 34.1 g/dL (ref 30.0–36.0)
MCV: 76.8 fL — ABNORMAL LOW (ref 80.0–100.0)
Monocytes Absolute: 0.6 10*3/uL (ref 0.1–1.0)
Monocytes Relative: 9 %
Neutro Abs: 3.6 10*3/uL (ref 1.7–7.7)
Neutrophils Relative %: 57 %
Platelet Count: 172 10*3/uL (ref 150–400)
RBC: 5.87 MIL/uL — ABNORMAL HIGH (ref 4.22–5.81)
RDW: 16.9 % — ABNORMAL HIGH (ref 11.5–15.5)
WBC Count: 6.3 10*3/uL (ref 4.0–10.5)
nRBC: 0 % (ref 0.0–0.2)

## 2021-07-14 LAB — HM DIABETES EYE EXAM

## 2021-07-14 MED ORDER — ROSUVASTATIN CALCIUM 10 MG PO TABS
10.0000 mg | ORAL_TABLET | Freq: Every day | ORAL | 1 refills | Status: DC
  Filled 2021-07-14: qty 90, 90d supply, fill #0
  Filled 2021-10-10: qty 90, 90d supply, fill #1

## 2021-07-14 MED ORDER — DOXYCYCLINE HYCLATE 100 MG PO TABS
100.0000 mg | ORAL_TABLET | Freq: Two times a day (BID) | ORAL | 3 refills | Status: DC
  Filled 2021-07-14 – 2021-08-30 (×2): qty 180, 90d supply, fill #0
  Filled 2021-12-14: qty 180, 90d supply, fill #1

## 2021-07-14 NOTE — Patient Instructions (Signed)
Please have dr Arvilla Market office check for sed rate and crp

## 2021-07-14 NOTE — Progress Notes (Signed)
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 Negative MPL515 mutation, Negative BCR/ABL and negative JAK-2 exon 12.  PRIOR THERAPY: None  CURRENT THERAPY: Phlebotomy on as-needed basis.  INTERVAL HISTORY: [name]. 60 y.o. male returns to the clinic today for 1 month follow-up visit.  The patient is feeling fine today with no concerning complaints.  He denied having any current chest pain, shortness of breath, cough or hemoptysis.  He denied having any nausea, vomiting, diarrhea or constipation.  He has no headache or visual changes.  He has no weight loss or night sweats.  He is currently on observation.  He is here for evaluation and repeat blood work.   MEDICAL HISTORY: Past Medical History:  Diagnosis Date   Arthritis    Diabetes mellitus without complication ([place])    TYPE 2   Diverticulosis    Dyslipidemia    GERD (gastroesophageal reflux disease)    Herpes labialis    Hypertension    Longstanding persistent atrial fibrillation (HCC)    Metabolic syndrome    Obesity    Proteinuria    Psoriasis    Seborrheic dermatitis    Sleep apnea    very compliant with CPAP, PT NEEDS TO BRING OWN MACHINE   TIA (transient ischemic attack) 2009    ALLERGIES:  is allergic to morphine and related.  MEDICATIONS:  Current Outpatient Medications  Medication Sig Dispense Refill   carvedilol (COREG) 25 MG tablet Take 1 tablet (25 mg total) by mouth 2 (two) times daily. 180 tablet 3   clobetasol cream (TEMOVATE) 0.05 % APPLY TOPICALLY TO AFFECTED AREA TWICE DAILY. 30 g 0   doxycycline (VIBRA-TABS) 100 MG tablet Take 1 tablet (100 mg total) by mouth 2 (two) times daily. 180 tablet 3   empagliflozin (JARDIANCE) 10 MG TABS tablet TAKE 1 TABLET BY MOUTH ONCE  DAILY 90 tablet 0   glucose blood test strip 1 each by Other route as needed. Use as instructed Freestyle test stripts 100 each 3   glucose monitoring kit (FREESTYLE) monitoring kit 1 each by Does not apply route as needed. 1 each 0   Lancets (FREESTYLE) lancets 1 each by Other route as needed. Use as instructed 100 each 3   Multiple Vitamin (MULTIVITAMIN WITH MINERALS) TABS tablet Take 1 tablet by mouth daily.     mupirocin ointment (BACTROBAN) 2 % mupirocin 2 % topical ointment  APPLY TOPICALLY TO THE AFFECTED AREA ON THE SKIN TWICE DAILY.     Oxymetazoline HCl (NASAL SPRAY) 0.05 % SOLN Place into the nose.     pantoprazole (PROTONIX) 40 MG tablet Take 1 tablet (40 mg total) by mouth 2 (two) times daily. 270 tablet 3   rivaroxaban (XARELTO) 20 MG TABS tablet Take 1 tablet (20 mg total) by mouth daily with supper. Overdue for follow-up, MUST see MD for FUTURE refills. 90 tablet 0   rosuvastatin (CRESTOR) 10 MG tablet TAKE 1 TABLET (10 MG TOTAL) BY MOUTH DAILY. 90 tablet 2   sacubitril-valsartan (ENTRESTO) 24-26 MG TAKE 1 TABLET BY MOUTH 2 (TWO) TIMES DAILY. 180 tablet 3   TALTZ 80 MG/ML SOAJ Inject 1 ml (67m) under the skin every 4 weeks. 1 mL 8   Vitamin D, Cholecalciferol, 1000 units CAPS Take 1,000 Units by mouth daily.  No current facility-administered medications for this visit.    SURGICAL HISTORY:  Past Surgical History:  Procedure Laterality Date   CARDIOVERSION N/A 12/02/2016   Procedure: CARDIOVERSION;  Surgeon: Pixie Casino, MD;  [facility]: [facility];  Service: Cardiovascular;  Laterality: N/A;   COLONOSCOPY     EXCISIONAL TOTAL KNEE ARTHROPLASTY WITH ANTIBIOTIC SPACERS Right 03/28/2019   Procedure: EXCISIONAL TOTAL KNEE ARTHROPLASTY WITH ANTIBIOTIC SPACERS;  Surgeon: [name], MD;  [facility];  Service: Orthopedics;  Laterality: Right;  90 mins   I & D KNEE WITH POLY EXCHANGE Right 09/10/2018   Procedure: Right Knee Arthroplasty IRRIGATION AND DEBRIDEMENT KNEE  WITH POLY EXCHANGE;  Surgeon: [name], MD;  [facility];  Service: Orthopedics;  Laterality: Right;   I & D KNEE WITH POLY EXCHANGE Right 06/25/2020   Procedure: Open excisional and non excisional debridement right knee, possible aspiration versus open arthrotomy poly exchange;  Surgeon: [name], MD;  [facility];  Service: Orthopedics;  Laterality: Right;   IRRIGATION AND DEBRIDEMENT KNEE Right 07/09/2020   Procedure: IRRIGATION AND DEBRIDEMENT KNEE;  Surgeon: [name], MD;  [facility];  Service: Orthopedics;  Laterality: Right;   JOINT REPLACEMENT  11/03/10   LT HIP   PFO occluder cardiac valve  2006   Dr. Einar Gip, (hole in heart)   REIMPLANTATION OF TOTAL KNEE Right 06/15/2019   Procedure: Resection of antibiotic spacer and  irrigation and debridment and placement of new antibiotic spacer and components;  Surgeon: [name], MD;  [facility];  Service: Orthopedics;  Laterality: Right;  90 mins   REIMPLANTATION OF TOTAL KNEE Right 08/22/2019   Procedure: REIMPLANTATION OF TOTAL KNEE;  Surgeon: [name], MD;  [facility];  Service: Orthopedics;  Laterality: Right;  120 mins   SKIN BIOPSY Left 12/22/2019   epidermal inclusion cyst   TONSILLECTOMY AND ADENOIDECTOMY     TOTAL HIP ARTHROPLASTY Left    TOTAL KNEE ARTHROPLASTY Right 05/31/2018   Procedure: RIGHT TOTAL KNEE ARTHROPLASTY;  Surgeon: [name], MD;  [facility];  Service: Orthopedics;  Laterality: Right;  70 mins   TOTAL KNEE ARTHROPLASTY Left 06/28/2018   Procedure: LEFT TOTAL KNEE ARTHROPLASTY;  Surgeon: [name], MD;  [facility];  Service: Orthopedics;  Laterality: Left;  70 mins   WISDOM TOOTH EXTRACTION      REVIEW OF SYSTEMS:  A comprehensive review of systems was negative.   PHYSICAL EXAMINATION: General appearance: alert, cooperative and no distress Head: Normocephalic, without obvious abnormality, atraumatic Neck: no adenopathy, no JVD, supple, symmetrical,  trachea midline and thyroid not enlarged, symmetric, no tenderness/mass/nodules Lymph nodes: Cervical, supraclavicular, and axillary nodes normal. Resp: clear to auscultation bilaterally Back: symmetric, no curvature. ROM normal. No CVA tenderness. Cardio: regular rate and rhythm, S1, S2 normal, no murmur, click, rub or gallop GI: soft, non-tender; bowel sounds normal; no masses,  no organomegaly Extremities: extremities normal, atraumatic, no cyanosis or edema  ECOG PERFORMANCE STATUS: 1 - Symptomatic but completely ambulatory  Blood pressure 111/71, pulse 93, temperature 98 F (36.7 C), temperature source Tympanic, resp. rate 20, height 5' 10"  (1.778 m), weight 264 lb 6.4 oz (119.9 kg), SpO2 99 %.  LABORATORY DATA: Lab Results  Component Value Date   WBC 6.3 07/14/2021   HGB 15.4 07/14/2021   HCT 45.1 07/14/2021   MCV 76.8 (L) 07/14/2021   PLT 172 07/14/2021      Chemistry      Component Value Date/Time   NA  136 04/22/2021 1533   NA 141 05/27/2020 0916   NA 138 05/22/2015 1303   K 4.5 04/22/2021 1533   K 4.1 05/22/2015 1303   CL 105 04/22/2021 1533   CO2 23 04/22/2021 1533   CO2 23 05/22/2015 1303   BUN 23 04/22/2021 1533   BUN 22 05/27/2020 0916   BUN 17.4 05/22/2015 1303   CREATININE 1.29 (H) 04/22/2021 1533   CREATININE 1.18 09/03/2020 1620   CREATININE 1.1 05/22/2015 1303      Component Value Date/Time   CALCIUM 9.3 04/22/2021 1533   CALCIUM 9.4 05/22/2015 1303   ALKPHOS 48 04/22/2021 1533   ALKPHOS 73 05/22/2015 1303   AST 20 04/22/2021 1533   AST 24 05/22/2015 1303   ALT 17 06/09/2021 1019   ALT 25 04/22/2021 1533   ALT 30 05/22/2015 1303   BILITOT 1.3 (H) 04/22/2021 1533   BILITOT 0.77 05/22/2015 1303       RADIOGRAPHIC STUDIES: No results found.  ASSESSMENT AND PLAN: This is a very pleasant 60 years old white male with polycythemia highly suspicious for polycythemia vera but with negative JAK mutation.   He has been receiving phlebotomy on  as-needed basis to keep his hematocrit between 45-50%. His CBC today showed hematocrit of 45.1%. I recommended for the patient to continue on observation with repeat blood work and phlebotomy if needed in 1 months. The patient was advised to call immediately if he has any other concerning symptoms in the interval. For the sleep apnea, the patient is followed by Dr. Annamaria Boots. He was advised to call immediately if he has any concerning symptoms in the interval. The patient voices understanding of current disease status and treatment options and is in agreement with the current care plan.  All questions were answered. The patient knows to call the clinic with any problems, questions or concerns. We can certainly see the patient much sooner if necessary.   Disclaimer: This note was dictated with voice recognition software. Similar sounding words can inadvertently be transcribed and may not be corrected upon review.

## 2021-07-14 NOTE — Progress Notes (Signed)
RFV: chronic suppression for pji  Patient ID: William Barges., male   DOB: 06/21/1961, 60 y.o.   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 Has been chronically on doxy 170m bid. No issues. He was restarted on his medications for psoriasis without difficulty. He reports having  umbilical hernia. Has several BM throughout the night. Unsure if related to abtx  Has intentional weight loss -- #30.  Shed old habits, eating better.  Used to taking metamucil.  ---------- Sees dr bValetta Close- had eyes dilated this morning / sees dr mInda Merlin- hemochromatosis- and wonders if oxygen supplementation at night would help. Recently had 5 units pulled over 6 weeks Dr young - for sleep apnea  Outpatient Encounter Medications as of 07/14/2021  Medication Sig   carvedilol (COREG) 25 MG tablet Take 1 tablet (25 mg total) by mouth 2 (two) times daily.   clobetasol cream (TEMOVATE) 0.05 % APPLY TOPICALLY TO AFFECTED AREA TWICE DAILY.   doxycycline (VIBRA-TABS) 100 MG tablet Take 1 tablet (100 mg total) by mouth 2 (two) times daily.   empagliflozin (JARDIANCE) 10 MG TABS tablet TAKE 1 TABLET BY MOUTH ONCE DAILY   glucose blood test strip 1 each by Other route as needed. Use as instructed Freestyle test stripts   glucose monitoring kit (FREESTYLE) monitoring kit 1 each by Does not apply route as needed.   Lancets (FREESTYLE) lancets 1 each by Other route as needed. Use as instructed   Multiple Vitamin (MULTIVITAMIN WITH MINERALS) TABS tablet Take 1 tablet by mouth daily.   mupirocin ointment (BACTROBAN) 2 % mupirocin 2 % topical ointment  APPLY TOPICALLY TO THE AFFECTED AREA ON THE SKIN TWICE DAILY.   Oxymetazoline HCl (NASAL SPRAY) 0.05 % SOLN Place into the nose.   pantoprazole (PROTONIX) 40 MG tablet Take 1 tablet (40 mg total) by mouth 2 (two) times daily.   rivaroxaban (XARELTO) 20 MG TABS tablet Take 1 tablet (20 mg total) by mouth daily with supper. Overdue for follow-up,  MUST see MD for FUTURE refills.   rosuvastatin (CRESTOR) 10 MG tablet TAKE 1 TABLET (10 MG TOTAL) BY MOUTH DAILY.   sacubitril-valsartan (ENTRESTO) 24-26 MG TAKE 1 TABLET BY MOUTH 2 (TWO) TIMES DAILY.   TALTZ 80 MG/ML SOAJ Inject 1 ml (860m under the skin every 4 weeks.   Vitamin D, Cholecalciferol, 1000 units CAPS Take 1,000 Units by mouth daily.    No facility-administered encounter medications on file as of 07/14/2021.     Patient Active Problem List   Diagnosis Date Noted   Hypoxemia 05/27/2021   Infection of prosthetic right knee joint (HCK[place]1099/37/1696 Chronic systolic CHF (congestive heart failure) (HCSouth [place]01/27/2021   High risk medication use 11/08/2018   Infection of prosthetic knee joint (HCK[place]01/07/2019   S/P left TKA 06/28/2018   S/P TKR (total knee replacement), right 05/31/2018   Tubular adenoma of colon 10/18/2017   BCE (basal cell epithelioma), face 06/21/2017   Diabetic nephropathy associated with type 2 diabetes mellitus (HCSweet[place]02/13/2018   Permanent atrial fibrillation (HCBurnett02/13/2018   Polycythemia vera (HCEl [place]02/05/2015   Hyperlipidemia associated with type 2 diabetes mellitus (HCRuid[place]01/18/2016   Hypertension associated with diabetes (HCL[place]01/18/2016   Diabetes mellitus (HC[place]03/10/2013   Hx-TIA (transient ischemic attack) 09/05/2012   GERD (gastroesophageal reflux disease) 08/17/2011   Obesity (BMI 30-39.9) 08/17/2011   Psoriatic arthritis (HCR[place]edsville12/17/2012   Obstructive sleep apnea 08/05/2009  Health Maintenance Due  Topic Date Due   HIV Screening  Never done   COVID-19 Vaccine (4 - Booster for Pfizer series) 07/22/2020   OPHTHALMOLOGY EXAM  07/12/2021    [place]: Social History   Tobacco Use   Smoking status: Never   Smokeless tobacco: Never  Vaping Use   Vaping Use: Never used  Substance Use Topics   Alcohol use: Yes    Comment: 2/month   Drug use: No    Review of Systems Review of Systems  Constitutional: Negative for fever, chills,  diaphoresis, activity change, appetite change, fatigue and unexpected weight change.  HENT: Negative for congestion, sore throat, rhinorrhea, sneezing, trouble swallowing and sinus pressure.  Eyes: Negative for photophobia and visual disturbance.  Respiratory: Negative for cough, chest tightness, shortness of breath, wheezing and stridor.  Cardiovascular: Negative for chest pain, palpitations and leg swelling.  Gastrointestinal: Negative for nausea, vomiting, abdominal pain, diarrhea, constipation, blood in stool, abdominal distention and anal bleeding.  Genitourinary: Negative for dysuria, hematuria, flank pain and difficulty urinating.  Musculoskeletal: Negative for myalgias, back pain, joint swelling, arthralgias and gait problem.  Skin: Negative for color change, pallor, rash and wound.  Neurological: Negative for dizziness, tremors, weakness and light-headedness.  Hematological: Negative for adenopathy. Does not bruise/bleed easily.  Psychiatric/Behavioral: Negative for behavioral problems, confusion, sleep disturbance, dysphoric mood, decreased concentration and agitation.   Physical Exam   BP 114/77   Pulse 71   Temp 97.9 F (36.6 C) (Temporal)   Resp 16   Ht 5' 10"  (1.778 m)   Wt 262 lb (118.8 kg)   SpO2 99%   BMI 37.59 kg/m   Physical Exam  Constitutional: He is oriented to person, place, and time. He appears well-developed and well-nourished. No distress.  HENT:  Mouth/Throat: Oropharynx is clear and moist. No oropharyngeal exudate.  Cardiovascular: Normal rate, regular rhythm and normal heart sounds. Exam reveals no gallop and no friction rub.  No murmur heard.  Pulmonary/Chest: Effort normal and breath sounds normal. No respiratory distress. He has no wheezes.  Ext: right knee hyperpigmented surgical scar. No effusion or warmth Neurological: He is alert and oriented to person, place, and time.  Skin: Skin is warm and dry. No rash noted. No erythema.  Psychiatric: He has  a normal mood and affect. His behavior is normal.    CBC Lab Results  Component Value Date   WBC 6.2 06/12/2021   RBC 5.66 06/12/2021   HGB 15.7 06/12/2021   HCT 44.8 06/12/2021   PLT 186 06/12/2021   MCV 79.2 (L) 06/12/2021   MCH 27.7 06/12/2021   MCHC 35.0 06/12/2021   RDW 14.5 06/12/2021   LYMPHSABS 2.3 06/12/2021   MONOABS 0.5 06/12/2021   EOSABS 0.1 06/12/2021    BMET Lab Results  Component Value Date   NA 136 04/22/2021   K 4.5 04/22/2021   CL 105 04/22/2021   CO2 23 04/22/2021   GLUCOSE 117 (H) 04/22/2021   BUN 23 04/22/2021   CREATININE 1.29 (H) 04/22/2021   CALCIUM 9.3 04/22/2021   GFRNONAA >60 04/22/2021   GFRAA 74 05/27/2020   Lab Results  Component Value Date   ESRSEDRATE 2 09/03/2020   Lab Results  Component Value Date   CRP 0.7 09/03/2020      Assessment and Plan  Hx of mssa pji= continue on doxy. Do sunscreen in spring/summer Will check sed rate and crp -at hematology when he has blood letting for PCV Will give refills doxycycline  Health  maintenance =Will give him bivalent covid vaccine

## 2021-07-15 ENCOUNTER — Other Ambulatory Visit (HOSPITAL_COMMUNITY): Payer: Self-pay

## 2021-07-15 ENCOUNTER — Telehealth: Payer: Self-pay | Admitting: Internal Medicine

## 2021-07-15 DIAGNOSIS — G4734 Idiopathic sleep related nonobstructive alveolar hypoventilation: Secondary | ICD-10-CM

## 2021-07-15 NOTE — Telephone Encounter (Signed)
Left message with follow-up appointment per 11/14 los. 

## 2021-07-15 NOTE — Telephone Encounter (Signed)
Received the following message from patient:   "Dr. Annamaria Boots, Is there any update on me introducing oxygen in to my BiPAP?   [facility] contacted me about a new machine and apparently they are back ordered.  I hope to hear from them soon.  Please advise.  Thank you.  Regards.  [name] May 15, 1961 [phone]"  I reviewed his chart and see where an order was placed back in August for his bipap to be replaced. But I do not see any order to have O2 bled into his bipap machine.   Dr. Annamaria Boots, can you please advise? Thanks!

## 2021-07-16 ENCOUNTER — Encounter: Payer: Self-pay | Admitting: Family Medicine

## 2021-07-16 NOTE — Telephone Encounter (Signed)
Please order DME [facility]  overnight oximetry on BIPAP/ room air. Don't wait for replacement of old machine.

## 2021-07-21 ENCOUNTER — Other Ambulatory Visit (HOSPITAL_COMMUNITY): Payer: Self-pay

## 2021-07-23 ENCOUNTER — Other Ambulatory Visit (HOSPITAL_COMMUNITY): Payer: Self-pay

## 2021-07-28 ENCOUNTER — Other Ambulatory Visit (HOSPITAL_COMMUNITY): Payer: Self-pay

## 2021-07-29 ENCOUNTER — Encounter: Payer: Self-pay | Admitting: Family Medicine

## 2021-07-29 ENCOUNTER — Other Ambulatory Visit: Payer: Self-pay

## 2021-07-29 ENCOUNTER — Ambulatory Visit: Payer: 59 | Admitting: Family Medicine

## 2021-07-29 VITALS — BP 114/76 | HR 70 | Temp 97.6°F | Wt 263.5 lb

## 2021-07-29 DIAGNOSIS — E1159 Type 2 diabetes mellitus with other circulatory complications: Secondary | ICD-10-CM | POA: Diagnosis not present

## 2021-07-29 DIAGNOSIS — G4733 Obstructive sleep apnea (adult) (pediatric): Secondary | ICD-10-CM

## 2021-07-29 DIAGNOSIS — E669 Obesity, unspecified: Secondary | ICD-10-CM

## 2021-07-29 DIAGNOSIS — Z8673 Personal history of transient ischemic attack (TIA), and cerebral infarction without residual deficits: Secondary | ICD-10-CM

## 2021-07-29 DIAGNOSIS — Z96651 Presence of right artificial knee joint: Secondary | ICD-10-CM

## 2021-07-29 DIAGNOSIS — I152 Hypertension secondary to endocrine disorders: Secondary | ICD-10-CM

## 2021-07-29 DIAGNOSIS — D45 Polycythemia vera: Secondary | ICD-10-CM | POA: Diagnosis not present

## 2021-07-29 DIAGNOSIS — E1121 Type 2 diabetes mellitus with diabetic nephropathy: Secondary | ICD-10-CM | POA: Diagnosis not present

## 2021-07-29 DIAGNOSIS — K219 Gastro-esophageal reflux disease without esophagitis: Secondary | ICD-10-CM | POA: Diagnosis not present

## 2021-07-29 DIAGNOSIS — Z79899 Other long term (current) drug therapy: Secondary | ICD-10-CM

## 2021-07-29 LAB — POCT GLYCOSYLATED HEMOGLOBIN (HGB A1C): Hemoglobin A1C: 6 % — AB (ref 4.0–5.6)

## 2021-07-29 NOTE — Progress Notes (Signed)
Subjective:    Patient ID: [name]., male    DOB: 1960/12/14, 60 y.o.   MRN: [id number]  [name]. is a 60 y.o. male who presents for follow-up of Type 2 diabetes mellitus. Home blood sugar records:  not checking Current symptoms/problems include none and have been improving. Daily foot checks:yes  Any foot concerns: none Exercise:  staying active  Diet: good He has lost a significant amount of weight using Octavia and plans to go back to that.  He has a goal of getting down to 200 pounds.  He is followed by ID and continues on his antibiotic.  Continues on Crestor without difficulty.  He is using Jardiance.  Continues on Coreg.  His reflux seems to be under good control.  He is followed regularly by Dr. Earlie Server for his polycythemia.  Takes Xarelto for his underlying A. fib.  He is also using his CPAP regularly.  He is planning on having an overnight oximetry to see if that plays a role in his polycythemia. The following portions of the patient's history were reviewed and updated as appropriate: allergies, current medications, past medical history, past social history and problem list. ROS as in subjective above.     Objective:    Physical Exam Alert and in no distress otherwise not examined. Hemoglobin A1c is 6.0 Lab Review Diabetic Labs Latest Ref Rng & Units 06/09/2021 04/22/2021 04/08/2021 03/28/2021 11/25/2020  HbA1c 4.0 - 5.6 % - - - 5.6 6.8(A)  Microalbumin mg/L - - - 77.0 -  Micro/Creat Ratio - - - - 105.2 -  Chol 100 - 199 mg/dL 102 - - - 111  HDL >39 mg/dL 42 - - - 35(L)  Calc LDL 0 - 99 mg/dL 38 - - - 38  Triglycerides 0 - 149 mg/dL 120 - - - 248(H)  Creatinine 0.61 - 1.24 mg/dL - 1.29(H) 1.31(H) - -   BP/Weight 07/14/2021 07/14/2021 06/12/2021 06/09/2021 02/11/4314  Systolic BP 400 867 619 509 99  Diastolic BP 71 77 77 66 73  Wt. (Lbs) 264.4 262 262.56 261.6 -  BMI 37.94 37.59 37.67 37.54 -   Foot/eye exam completion dates Latest Ref Rng & Units  07/14/2021 03/28/2021  Eye Exam No Retinopathy No Retinopathy -  Foot Form Completion - - Done    Johanna  reports that he has never smoked. He has never used smokeless tobacco. He reports current alcohol use. He reports that he does not use drugs.     Assessment & Plan:    Type 2 diabetes mellitus with diabetic nephropathy, without long-term current use of insulin ([place])  Hypertension associated with diabetes ([place]) - Plan: POCT glycosylated hemoglobin (Hb A1C)  Obstructive sleep apnea  Polycythemia vera (HCC)  Obesity (BMI 30-39.9)  Hx-TIA (transient ischemic attack)  Gastroesophageal reflux disease without esophagitis  Diabetic nephropathy associated with type 2 diabetes mellitus (HCC)  S/P TKR (total knee replacement), right  High risk medication use   Rx changes: none Education: Reviewed 'ABCs' of diabetes management (respective goals in parentheses):  A1C (<7), blood pressure (<130/80), and cholesterol (LDL <100). Compliance at present is estimated to be good. Efforts to improve compliance (if necessary) will be directed at dietary modifications: With Optivia . Follow up: 6 months I had a good discussion with him concerning activity and the fact that he needs to take that information and apply at his regular diet.  Talked about continuing on his present medication regimen and with his CPAP.  Discussed  the fact that if he continues to lose weight we can reevaluate his need for the CPAP.  It will be interesting to see with the overnight oximetry shows.  He will continue on his antibiotic.

## 2021-07-30 ENCOUNTER — Other Ambulatory Visit (HOSPITAL_COMMUNITY): Payer: Self-pay

## 2021-07-30 ENCOUNTER — Telehealth: Payer: Self-pay | Admitting: Internal Medicine

## 2021-07-30 NOTE — Telephone Encounter (Signed)
R/s per prov ok 11/30, left msg

## 2021-08-05 ENCOUNTER — Other Ambulatory Visit (HOSPITAL_COMMUNITY): Payer: Self-pay

## 2021-08-08 ENCOUNTER — Other Ambulatory Visit (HOSPITAL_COMMUNITY): Payer: Self-pay

## 2021-08-11 ENCOUNTER — Other Ambulatory Visit (HOSPITAL_COMMUNITY): Payer: Self-pay

## 2021-08-13 DIAGNOSIS — R0902 Hypoxemia: Secondary | ICD-10-CM | POA: Diagnosis not present

## 2021-08-14 NOTE — Telephone Encounter (Signed)
Overnight oximetry on room air, while wearing BIPAP, shows that oxygen levels stayed normal. [name] does not need to add oxygen as long as he continues to use BIPAP.

## 2021-08-15 NOTE — Telephone Encounter (Signed)
CY please advise. He was told about the results of the ONO.  Thanks   Thank you for letting me know. Please advise as to how we move forward. Thank you. Merry Christmas. [name]

## 2021-08-15 NOTE — Telephone Encounter (Signed)
At our September office visit we started the process of replacing his old PAP machine through [facility]. I don't know where that stands.  The overnight oximetry test showed that oxygen level is staying normal at night, with his VPAP. Therefore low oxygen levels are not contributing to his polycythemia. Dr Julien Nordmann, his hematologist, will decide what to do about that problem going forward.

## 2021-08-20 ENCOUNTER — Inpatient Hospital Stay: Payer: 59 | Attending: Internal Medicine

## 2021-08-20 ENCOUNTER — Inpatient Hospital Stay: Payer: 59 | Admitting: Internal Medicine

## 2021-08-20 ENCOUNTER — Other Ambulatory Visit: Payer: Self-pay

## 2021-08-20 VITALS — BP 132/90 | HR 74 | Temp 97.5°F | Resp 20 | Ht 70.0 in | Wt 269.0 lb

## 2021-08-20 DIAGNOSIS — D45 Polycythemia vera: Secondary | ICD-10-CM

## 2021-08-20 DIAGNOSIS — D751 Secondary polycythemia: Secondary | ICD-10-CM | POA: Diagnosis not present

## 2021-08-20 LAB — CMP (CANCER CENTER ONLY)
ALT: 27 U/L (ref 0–44)
AST: 25 U/L (ref 15–41)
Albumin: 4.1 g/dL (ref 3.5–5.0)
Alkaline Phosphatase: 48 U/L (ref 38–126)
Anion gap: 10 (ref 5–15)
BUN: 20 mg/dL (ref 8–23)
CO2: 21 mmol/L — ABNORMAL LOW (ref 22–32)
Calcium: 9.8 mg/dL (ref 8.9–10.3)
Chloride: 104 mmol/L (ref 98–111)
Creatinine: 1.08 mg/dL (ref 0.61–1.24)
GFR, Estimated: >60 mL/min (ref >60)
Glucose, Bld: 139 mg/dL — ABNORMAL HIGH (ref 70–99)
Potassium: 4 mmol/L (ref 3.5–5.1)
Sodium: 135 mmol/L (ref 135–145)
Total Bilirubin: 1.3 mg/dL — ABNORMAL HIGH (ref 0.3–1.2)
Total Protein: 6.7 g/dL (ref 6.5–8.1)

## 2021-08-20 LAB — CBC WITH DIFFERENTIAL (CANCER CENTER ONLY)
Abs Immature Granulocytes: 0.02 10*3/uL (ref 0.00–0.07)
Basophils Absolute: 0 10*3/uL (ref 0.0–0.1)
Basophils Relative: 1 %
Eosinophils Absolute: 0.1 10*3/uL (ref 0.0–0.5)
Eosinophils Relative: 2 %
HCT: 48.6 % (ref 39.0–52.0)
Hemoglobin: 16.2 g/dL (ref 13.0–17.0)
Immature Granulocytes: 0 %
Lymphocytes Relative: 32 %
Lymphs Abs: 1.9 10*3/uL (ref 0.7–4.0)
MCH: 25.4 pg — ABNORMAL LOW (ref 26.0–34.0)
MCHC: 33.3 g/dL (ref 30.0–36.0)
MCV: 76.1 fL — ABNORMAL LOW (ref 80.0–100.0)
Monocytes Absolute: 0.5 10*3/uL (ref 0.1–1.0)
Monocytes Relative: 8 %
Neutro Abs: 3.5 10*3/uL (ref 1.7–7.7)
Neutrophils Relative %: 57 %
Platelet Count: 168 10*3/uL (ref 150–400)
RBC: 6.39 MIL/uL — ABNORMAL HIGH (ref 4.22–5.81)
RDW: 18.2 % — ABNORMAL HIGH (ref 11.5–15.5)
WBC Count: 6.1 10*3/uL (ref 4.0–10.5)
nRBC: 0 % (ref 0.0–0.2)

## 2021-08-20 LAB — LACTATE DEHYDROGENASE: LDH: 148 U/L (ref 98–192)

## 2021-08-20 NOTE — Progress Notes (Signed)
°    [facility] °[phone]   [phone] ° °OFFICE PROGRESS NOTE ° °Lalonde, John C, MD °[address] °[place] [address] ° °DIAGNOSIS: Persistent polycythemia highly suspicious for polycythemia vera with negative JAK-2 mutation. Negative [id number] mutation, Negative BCR/ABL and negative JAK-2 exon 12. ° °PRIOR THERAPY: None ° °CURRENT THERAPY: Phlebotomy on as-needed basis. ° °INTERVAL HISTORY: °[name]. 60 y.o. male returns to the clinic today for follow-up visit.  The patient is feeling fine today with no concerning complaints.  He had sleep study by Dr. Young and he indicated to him that he may not need to be on oxygen at regular basis.  The patient denied having any current chest pain but has shortness of breath with exertion with no cough or hemoptysis.  He denied having any fever or chills.  He has no nausea, vomiting, diarrhea or constipation.  He has no headache or visual changes.  He is here today for evaluation and repeat blood work. ° ° °MEDICAL HISTORY: °Past Medical History:  °Diagnosis Date  ° Arthritis   ° Diabetes mellitus without complication (HCC)   ° TYPE 2  ° Diverticulosis   ° Dyslipidemia   ° GERD (gastroesophageal reflux disease)   ° Herpes labialis   ° Hypertension   ° Longstanding persistent atrial fibrillation (HCC)   ° Metabolic syndrome   ° Obesity   ° Proteinuria   ° Psoriasis   ° Seborrheic dermatitis   ° Sleep apnea   ° very compliant with CPAP, PT NEEDS TO BRING OWN MACHINE  ° TIA (transient ischemic attack) 2009  ° ° °ALLERGIES:  is allergic to morphine and related. ° °MEDICATIONS:  °Current Outpatient Medications  °Medication Sig Dispense Refill  ° carvedilol (COREG) 25 MG tablet Take 1 tablet (25 mg total) by mouth 2 (two) times daily. 180 tablet 3  ° clobetasol cream (TEMOVATE) 0.05 % APPLY TOPICALLY TO AFFECTED AREA TWICE DAILY. 30 g 0  ° doxycycline (VIBRA-TABS) 100 MG tablet Take 1 tablet (100 mg total) by mouth 2 (two)  times daily. 180 tablet 3  ° empagliflozin (JARDIANCE) 10 MG TABS tablet TAKE 1 TABLET BY MOUTH ONCE DAILY 90 tablet 0  ° glucose blood test strip 1 each by Other route as needed. Use as instructed Freestyle test stripts 100 each 3  ° glucose monitoring kit (FREESTYLE) monitoring kit 1 each by Does not apply route as needed. 1 each 0  ° Lancets (FREESTYLE) lancets 1 each by Other route as needed. Use as instructed 100 each 3  ° Multiple Vitamin (MULTIVITAMIN WITH MINERALS) TABS tablet Take 1 tablet by mouth daily.    ° mupirocin ointment (BACTROBAN) 2 % mupirocin 2 % topical ointment ° APPLY TOPICALLY TO THE AFFECTED AREA ON THE SKIN TWICE DAILY.    ° Oxymetazoline HCl (NASAL SPRAY) 0.05 % SOLN Place into the nose.    ° pantoprazole (PROTONIX) 40 MG tablet Take 1 tablet (40 mg total) by mouth 2 (two) times daily. 270 tablet 3  ° rivaroxaban (XARELTO) 20 MG TABS tablet Take 1 tablet (20 mg total) by mouth daily with supper. Overdue for follow-up, MUST see MD for FUTURE refills. 90 tablet 0  ° rosuvastatin (CRESTOR) 10 MG tablet Take 1 tablet (10 mg total) by mouth daily. 90 tablet 1  ° sacubitril-valsartan (ENTRESTO) 24-26 MG TAKE 1 TABLET BY MOUTH 2 (TWO) TIMES DAILY. 180 tablet 3  ° TALTZ 80 MG/ML SOAJ Inject 1 ml (80mg) under the   skin every 4 weeks. 1 mL 8  ° Vitamin D, Cholecalciferol, 1000 units CAPS Take 1,000 Units by mouth daily.     ° °No current facility-administered medications for this visit.  ° ° °SURGICAL HISTORY:  °Past Surgical History:  °Procedure Laterality Date  ° CARDIOVERSION N/A 12/02/2016  ° Procedure: CARDIOVERSION;  Surgeon: Kenneth C Hilty, MD;  [facility]: [facility];  Service: Cardiovascular;  Laterality: N/A;  ° COLONOSCOPY    ° EXCISIONAL TOTAL KNEE ARTHROPLASTY WITH ANTIBIOTIC SPACERS Right 03/28/2019  ° Procedure: EXCISIONAL TOTAL KNEE ARTHROPLASTY WITH ANTIBIOTIC SPACERS;  Surgeon: Olin, Matthew, MD;  Location: [facility];  Service: Orthopedics;  Laterality: Right;  90 mins  ° I & D KNEE  WITH POLY EXCHANGE Right 09/10/2018  ° Procedure: Right Knee Arthroplasty IRRIGATION AND DEBRIDEMENT KNEE WITH POLY EXCHANGE;  Surgeon: Olin, Matthew, MD;  Location: [facility];  Service: Orthopedics;  Laterality: Right;  ° I & D KNEE WITH POLY EXCHANGE Right 06/25/2020  ° Procedure: Open excisional and non excisional debridement right knee, possible aspiration versus open arthrotomy poly exchange;  Surgeon: Olin, Matthew, MD;  Location: [facility];  Service: Orthopedics;  Laterality: Right;  ° IRRIGATION AND DEBRIDEMENT KNEE Right 07/09/2020  ° Procedure: IRRIGATION AND DEBRIDEMENT KNEE;  Surgeon: Olin, Matthew, MD;  Location: [facility];  Service: Orthopedics;  Laterality: Right;  ° JOINT REPLACEMENT  11/03/10  ° LT HIP  ° PFO occluder cardiac valve  2006  ° Dr. Ganji, (hole in heart)  ° REIMPLANTATION OF TOTAL KNEE Right 06/15/2019  ° Procedure: Resection of antibiotic spacer and  irrigation and debridment and placement of new antibiotic spacer and components;  Surgeon: Olin, Matthew, MD;  Location: [facility];  Service: Orthopedics;  Laterality: Right;  90 mins  ° REIMPLANTATION OF TOTAL KNEE Right 08/22/2019  ° Procedure: REIMPLANTATION OF TOTAL KNEE;  Surgeon: Olin, Matthew, MD;  Location: [facility];  Service: Orthopedics;  Laterality: Right;  120 mins  ° SKIN BIOPSY Left 12/22/2019  ° epidermal inclusion cyst  ° TONSILLECTOMY AND ADENOIDECTOMY    ° TOTAL HIP ARTHROPLASTY Left   ° TOTAL KNEE ARTHROPLASTY Right 05/31/2018  ° Procedure: RIGHT TOTAL KNEE ARTHROPLASTY;  Surgeon: Olin, Matthew, MD;  Location: [facility];  Service: Orthopedics;  Laterality: Right;  70 mins  ° TOTAL KNEE ARTHROPLASTY Left 06/28/2018  ° Procedure: LEFT TOTAL KNEE ARTHROPLASTY;  Surgeon: Olin, Matthew, MD;  Location: [facility];  Service: Orthopedics;  Laterality: Left;  70 mins  ° WISDOM TOOTH EXTRACTION    ° ° °REVIEW OF SYSTEMS:  A comprehensive review of systems was negative.  ° °PHYSICAL EXAMINATION: General appearance: alert, cooperative and no distress °Head:  Normocephalic, without obvious abnormality, atraumatic °Neck: no adenopathy, no JVD, supple, symmetrical, trachea midline and thyroid not enlarged, symmetric, no tenderness/mass/nodules °Lymph nodes: Cervical, supraclavicular, and axillary nodes normal. °Resp: clear to auscultation bilaterally °Back: symmetric, no curvature. ROM normal. No CVA tenderness. °Cardio: regular rate and rhythm, S1, S2 normal, no murmur, click, rub or gallop °GI: soft, non-tender; bowel sounds normal; no masses,  no organomegaly °Extremities: extremities normal, atraumatic, no cyanosis or edema ° °ECOG PERFORMANCE STATUS: 1 - Symptomatic but completely ambulatory ° °Blood pressure 132/90, pulse 74, temperature (!) 97.5 °F (36.4 °C), temperature source Tympanic, resp. rate 20, height 5' 10" (1.778 m), weight 269 lb (122 kg), SpO2 97 %. ° °LABORATORY DATA: °Lab Results  °Component Value Date  ° WBC 6.1 08/20/2021  ° HGB 16.2 08/20/2021  ° HCT 48.6 08/20/2021  ° MCV   MCV 76.1 (L) 08/20/2021   PLT 168 08/20/2021      Chemistry      Component Value Date/Time   NA 135 08/20/2021 1107   NA 141 05/27/2020 0916   NA 138 05/22/2015 1303   K 4.0 08/20/2021 1107   K 4.1 05/22/2015 1303   CL 104 08/20/2021 1107   CO2 21 (L) 08/20/2021 1107   CO2 23 05/22/2015 1303   BUN 20 08/20/2021 1107   BUN 22 05/27/2020 0916   BUN 17.4 05/22/2015 1303   CREATININE 1.08 08/20/2021 1107   CREATININE 1.18 09/03/2020 1620   CREATININE 1.1 05/22/2015 1303      Component Value Date/Time   CALCIUM 9.8 08/20/2021 1107   CALCIUM 9.4 05/22/2015 1303   ALKPHOS 48 08/20/2021 1107   ALKPHOS 73 05/22/2015 1303   AST 25 08/20/2021 1107   AST 24 05/22/2015 1303   ALT 27 08/20/2021 1107   ALT 30 05/22/2015 1303   BILITOT 1.3 (H) 08/20/2021 1107   BILITOT 0.77 05/22/2015 1303       RADIOGRAPHIC STUDIES: No results found.  ASSESSMENT AND PLAN: This is a very pleasant 60 years old white male with polycythemia highly suspicious for polycythemia vera  but with negative JAK mutation.   He has been receiving phlebotomy on as-needed basis to keep his hematocrit between 45-50%. His CBC today showed hematocrit of 48.6%. I recommended for the patient to continue on observation for now with repeat CBC, comprehensive metabolic panel and LDH in 2 months. Was also advised to call immediately if he has any other concerning symptoms in the interval. The patient voices understanding of current disease status and treatment options and is in agreement with the current care plan.  All questions were answered. The patient knows to call the clinic with any problems, questions or concerns. We can certainly see the patient much sooner if necessary.   Disclaimer: This note was dictated with voice recognition software. Similar sounding words can inadvertently be transcribed and may not be corrected upon review.

## 2021-08-26 ENCOUNTER — Ambulatory Visit: Payer: 59 | Admitting: Internal Medicine

## 2021-08-26 ENCOUNTER — Other Ambulatory Visit: Payer: 59

## 2021-08-29 ENCOUNTER — Telehealth: Payer: Self-pay | Admitting: Internal Medicine

## 2021-08-29 NOTE — Telephone Encounter (Signed)
Sch per 12/21 los, pt aware °

## 2021-08-30 ENCOUNTER — Other Ambulatory Visit: Payer: Self-pay | Admitting: Cardiovascular Disease

## 2021-09-01 ENCOUNTER — Other Ambulatory Visit: Payer: Self-pay | Admitting: Cardiovascular Disease

## 2021-09-01 ENCOUNTER — Other Ambulatory Visit (HOSPITAL_COMMUNITY): Payer: Self-pay

## 2021-09-02 ENCOUNTER — Other Ambulatory Visit (HOSPITAL_COMMUNITY): Payer: Self-pay

## 2021-09-05 ENCOUNTER — Other Ambulatory Visit: Payer: Self-pay | Admitting: *Deleted

## 2021-09-05 ENCOUNTER — Other Ambulatory Visit (HOSPITAL_COMMUNITY): Payer: Self-pay

## 2021-09-05 MED ORDER — RIVAROXABAN 20 MG PO TABS
20.0000 mg | ORAL_TABLET | Freq: Every day | ORAL | 1 refills | Status: DC
  Filled 2021-09-05: qty 90, 90d supply, fill #0
  Filled 2021-12-02: qty 90, 90d supply, fill #1

## 2021-09-05 NOTE — Telephone Encounter (Signed)
Prescription refill request for Xarelto received.  Indication: afib  Last office visit: 06/09/2021, [name] Weight: 122 kg  Age: 61 yo  Scr: 1.08, 08/20/2021 CrCl: 14ml/min   Refill sent.

## 2021-09-08 ENCOUNTER — Other Ambulatory Visit (HOSPITAL_COMMUNITY): Payer: Self-pay

## 2021-09-17 ENCOUNTER — Other Ambulatory Visit: Payer: Self-pay | Admitting: Family Medicine

## 2021-09-17 ENCOUNTER — Other Ambulatory Visit (HOSPITAL_COMMUNITY): Payer: Self-pay

## 2021-09-17 DIAGNOSIS — E1121 Type 2 diabetes mellitus with diabetic nephropathy: Secondary | ICD-10-CM

## 2021-09-17 DIAGNOSIS — G4733 Obstructive sleep apnea (adult) (pediatric): Secondary | ICD-10-CM | POA: Diagnosis not present

## 2021-09-18 ENCOUNTER — Other Ambulatory Visit (HOSPITAL_COMMUNITY): Payer: Self-pay

## 2021-09-18 MED ORDER — EMPAGLIFLOZIN 10 MG PO TABS
10.0000 mg | ORAL_TABLET | Freq: Every day | ORAL | 0 refills | Status: DC
  Filled 2021-09-18: qty 90, 90d supply, fill #0

## 2021-09-21 MED FILL — Sacubitril-Valsartan Tab 24-26 MG: ORAL | 90 days supply | Qty: 180 | Fill #3 | Status: AC

## 2021-09-22 ENCOUNTER — Other Ambulatory Visit (HOSPITAL_COMMUNITY): Payer: Self-pay

## 2021-09-26 ENCOUNTER — Ambulatory Visit: Payer: 59 | Admitting: Internal Medicine

## 2021-09-29 ENCOUNTER — Other Ambulatory Visit (HOSPITAL_COMMUNITY): Payer: Self-pay

## 2021-10-01 ENCOUNTER — Other Ambulatory Visit: Payer: Self-pay | Admitting: Pharmacist

## 2021-10-01 ENCOUNTER — Encounter: Payer: Self-pay | Admitting: Internal Medicine

## 2021-10-01 ENCOUNTER — Other Ambulatory Visit (HOSPITAL_COMMUNITY): Payer: Self-pay

## 2021-10-01 MED ORDER — TALTZ 80 MG/ML ~~LOC~~ SOAJ
SUBCUTANEOUS | 10 refills | Status: DC
  Filled 2021-10-01: qty 1, 28d supply, fill #0
  Filled 2021-10-21: qty 1, 28d supply, fill #1
  Filled 2021-11-19: qty 1, 28d supply, fill #2
  Filled 2021-12-15: qty 1, 28d supply, fill #3
  Filled 2022-01-05: qty 1, 28d supply, fill #4
  Filled 2022-02-02: qty 1, 28d supply, fill #5
  Filled 2022-03-05: qty 1, 28d supply, fill #6
  Filled 2022-04-02: qty 1, 28d supply, fill #7
  Filled 2022-04-27: qty 1, 28d supply, fill #8
  Filled 2022-05-27 – 2022-05-29 (×2): qty 1, 28d supply, fill #9
  Filled 2022-06-15: qty 1, 28d supply, fill #10

## 2021-10-01 MED ORDER — TALTZ 80 MG/ML ~~LOC~~ SOAJ
SUBCUTANEOUS | 10 refills | Status: DC
  Filled 2021-10-01: qty 1, fill #0

## 2021-10-01 NOTE — Progress Notes (Signed)
HPI [name] never smoker followed for OSA, complicated by TIA, AFib, HTN, CHF, OSA, GERD, Hyperlipidemia, DM2, Nephropathy, Psoriasis, Psoriatic Arthritis, Polycythemia/ phlebotomy,  Obesity,  NPSG 10/26/05- AHI 47.3/ hr, body weight 300 lbs Walk Test on room air today/ 05/27/21- room air, 750 feet, "fast pace"- lowest O2 sat 95%, max HR 61/min. Overnight Oximetry on BIPAP/room air 08/11/21- lowest O2 sat 91%- does not qualify for sleep O2. ===============================================================  05/27/21- 61 yo [name] never smoker for sleep evaluation courtesy of Dr Curt Bears with concern of polycythemia, OSA Medical problem list includes TIA, AFib, HTN, CHF, OSA, GERD, Hyperlipidemia, DM2, Nephropathy, Psoriasis, Psoriatic Arthritis, Polycythemia/ phlebotomy,  Obesity,  NPSG 10/26/05- AHI 47.3/ hr, body weight 300 lbs VPAP [facility] IPAP 11, EPAP 8 PS2 Download compliance 100%, AHI 0.1/ hr Epworth score-3 Body weight today- 261 lbs Covid vax- Flu vax- He denies hx of lung disease but detailed his complicated hx of multiple knee replacement surgeries, AFib and CHF. Denies hx or PE. No unusual dyspnea, cough or wheeze. Dr Redmond School has recommended weight loss. His wife sleeps in separate room, but denies hearing him snore wiwth his VPAP on. Machine is now well over 51 years old. He and his wife today would like me to take over management and start process of replacement with newer machine.  Walk Test on room air today/ 05/27/21- room air, 750 feet, "fast pace"- lowest O2 sat 95%, max HR 61/min.  10/02/21- 60 yoM never smoker followed for OSA, complicated by TIA, PAFib, HTN, CHF, OSA, GERD, Hyperlipidemia, DM2, Nephropathy, Psoriasis, Psoriatic Arthritis, Polycythemia/ phlebotomy,  Obesity,  VPAP [facility] IPAP 11, EPAP 8   PS2   replacement ordered in Sept, 2022 Download- N/A Body weight today-278 lbs Covid vax-4 Phizer Flu vax-flu Overnight Oximetry on BIPAP/room air 08/11/21- lowest O2 sat 91%-  does not qualify for sleep O2. His old BiPAP machine still works well.  He does not want to sleep without it and is waiting for replacement.  Overnight oximetry was good on BiPAP.  We have not demonstrated significant oxygen desaturation.  That was the question raised by Dr. Julien Nordmann. He has an adjustable bed.  He asked about alternative treatments but chooses to stay with BIPAP. He declines ENT referral. CXR 05/27/21 FINDINGS: Lung volumes are normal. No consolidative airspace disease. No pleural effusions. No pneumothorax. No pulmonary nodule or mass noted. Pulmonary vasculature and the cardiomediastinal silhouette are within normal limits. Atherosclerosis in the thoracic aorta. Atrial septal occluder device incidentally noted.  IMPRESSION: No radiographic evidence of acute cardiopulmonary disease.    ROS-see HPI   + = positive Constitutional:    weight loss, night sweats, fevers, chills, fatigue, lassitude. HEENT:    headaches, difficulty swallowing, tooth/dental problems, sore throat,       sneezing, itching, ear ache, nasal congestion, post nasal drip, snoring CV:    chest pain, orthopnea, PND, swelling in lower extremities, anasarca,                                  dizziness, palpitations Resp:   shortness of breath with exertion or at rest.                productive cough,   non-productive cough, coughing up of blood.              change in color of mucus.  wheezing.   Skin:    rash or lesions. GI:  No-  heartburn, indigestion, abdominal pain, nausea, vomiting, diarrhea,                 change in bowel habits, loss of appetite GU: dysuria, change in color of urine, no urgency or frequency.   flank pain. MS:   joint pain, stiffness, decreased range of motion, back pain. Neuro-     nothing unusual Psych:  change in mood or affect.  depression or anxiety.   memory loss.  OBJ- Physical Exam General- Alert, Oriented, Affect-appropriate, Distress- none acute, + overweight Skin-  r+lower legs hyperpigmented c/w previous venous stasis Lymphadenopathy- none Head- atraumatic            Eyes- Gross vision intact, PERRLA, conjunctivae and secretions clear            Ears- Hearing, canals-normal            Nose- Clear, no-Septal dev, mucus, polyps, erosion, perforation             Throat- Mallampati II-III , mucosa clear , drainage- none, tonsils- atrophic Neck- flexible , trachea midline, no stridor , thyroid nl, carotid no bruit Chest - symmetrical excursion , unlabored           Heart/CV- RRR almost regular , no murmur , no gallop  , no rub, nl s1 s2                           - JVD- none , edema- none, stasis changes- none, varices- none           Lung- clear to P&A, wheeze- none, cough- none , dullness-none, rub- none           Chest wall-  Abd-  Br/ Gen/ Rectal- Not done, not indicated Extrem- cyanosis- none, No clubbing, none, atrophy- none, strength- nl Neuro- grossly intact to observation

## 2021-10-02 ENCOUNTER — Other Ambulatory Visit (HOSPITAL_COMMUNITY): Payer: Self-pay

## 2021-10-02 ENCOUNTER — Encounter: Payer: Self-pay | Admitting: Internal Medicine

## 2021-10-02 ENCOUNTER — Other Ambulatory Visit: Payer: Self-pay

## 2021-10-02 ENCOUNTER — Ambulatory Visit: Payer: 59 | Admitting: Internal Medicine

## 2021-10-02 ENCOUNTER — Other Ambulatory Visit: Payer: Self-pay | Admitting: Family Medicine

## 2021-10-02 ENCOUNTER — Other Ambulatory Visit (HOSPITAL_BASED_OUTPATIENT_CLINIC_OR_DEPARTMENT_OTHER): Payer: Self-pay

## 2021-10-02 VITALS — BP 106/70 | HR 57 | Temp 98.4°F | Ht 70.0 in | Wt 278.0 lb

## 2021-10-02 DIAGNOSIS — I4821 Permanent atrial fibrillation: Secondary | ICD-10-CM | POA: Diagnosis not present

## 2021-10-02 DIAGNOSIS — D45 Polycythemia vera: Secondary | ICD-10-CM | POA: Diagnosis not present

## 2021-10-02 DIAGNOSIS — G4733 Obstructive sleep apnea (adult) (pediatric): Secondary | ICD-10-CM

## 2021-10-02 NOTE — Patient Instructions (Signed)
We are checking with [facility] about a replacement for your old BIPAP machine  We can always send you to somebody to discuss a fitted oral appliance or the Inspire surgically implanted nerve stimulator. Or you might consider a home generator.  Your tests have shown that your oxygen levels are good- this is not what you have polycythemia.

## 2021-10-02 NOTE — Telephone Encounter (Signed)
Left pt a VM to call back- need to see if he is wanting this as it is no longer on his medication list

## 2021-10-03 ENCOUNTER — Other Ambulatory Visit (HOSPITAL_COMMUNITY): Payer: Self-pay

## 2021-10-03 ENCOUNTER — Other Ambulatory Visit: Payer: Self-pay | Admitting: Family Medicine

## 2021-10-03 ENCOUNTER — Telehealth: Payer: Self-pay

## 2021-10-03 MED ORDER — METHOCARBAMOL 500 MG PO TABS
500.0000 mg | ORAL_TABLET | Freq: Four times a day (QID) | ORAL | 0 refills | Status: DC | PRN
  Filled 2021-10-03: qty 40, 10d supply, fill #0

## 2021-10-03 NOTE — Assessment & Plan Note (Signed)
Rhythm is regular or nearly so to palpation at this visit

## 2021-10-03 NOTE — Assessment & Plan Note (Signed)
No significant oxygen desaturation demonstrated during sleep or walking.

## 2021-10-03 NOTE — Telephone Encounter (Signed)
Pt. Called and requested a refill on his methocarbomal he has been having a lot of pain in his knee again. Last apt 07/29/21 and next apt 02/06/22.

## 2021-10-03 NOTE — Assessment & Plan Note (Signed)
He describes good compliance and control.  We are awaiting download requested from [facility]. Plan-continue current variable BiPAP settings 11/8, PS 2.  Updating order to [facility] for replacement of old machine.

## 2021-10-03 NOTE — Addendum Note (Signed)
Addended by: Sheilah Pigeon A on: 10/03/2021 04:24 PM   Modules accepted: Orders

## 2021-10-03 NOTE — Telephone Encounter (Signed)
[name] is requesting to fill pt robaxin. Please advise [facility]

## 2021-10-13 ENCOUNTER — Telehealth: Payer: Self-pay | Admitting: Internal Medicine

## 2021-10-13 ENCOUNTER — Other Ambulatory Visit (HOSPITAL_COMMUNITY): Payer: Self-pay

## 2021-10-13 NOTE — Telephone Encounter (Signed)
Rescheduled 02/22 appointment to 02/01 due to provider pal, patient has been called and voicemail was left.

## 2021-10-21 ENCOUNTER — Other Ambulatory Visit (HOSPITAL_COMMUNITY): Payer: Self-pay

## 2021-10-21 ENCOUNTER — Ambulatory Visit: Payer: 59

## 2021-10-21 ENCOUNTER — Inpatient Hospital Stay: Payer: 59 | Attending: Internal Medicine | Admitting: Internal Medicine

## 2021-10-21 ENCOUNTER — Inpatient Hospital Stay: Payer: 59

## 2021-10-21 ENCOUNTER — Other Ambulatory Visit: Payer: 59

## 2021-10-21 ENCOUNTER — Other Ambulatory Visit: Payer: Self-pay

## 2021-10-21 VITALS — BP 133/83 | HR 54 | Temp 97.3°F | Resp 16 | Wt 280.6 lb

## 2021-10-21 VITALS — BP 109/69 | HR 85 | Resp 18

## 2021-10-21 DIAGNOSIS — D45 Polycythemia vera: Secondary | ICD-10-CM

## 2021-10-21 DIAGNOSIS — D751 Secondary polycythemia: Secondary | ICD-10-CM | POA: Diagnosis not present

## 2021-10-21 LAB — CBC WITH DIFFERENTIAL (CANCER CENTER ONLY)
Abs Immature Granulocytes: 0.03 10*3/uL (ref 0.00–0.07)
Basophils Absolute: 0 10*3/uL (ref 0.0–0.1)
Basophils Relative: 0 %
Eosinophils Absolute: 0.1 10*3/uL (ref 0.0–0.5)
Eosinophils Relative: 2 %
HCT: 50.4 % (ref 39.0–52.0)
Hemoglobin: 16.9 g/dL (ref 13.0–17.0)
Immature Granulocytes: 0 %
Lymphocytes Relative: 39 %
Lymphs Abs: 2.8 10*3/uL (ref 0.7–4.0)
MCH: 25.7 pg — ABNORMAL LOW (ref 26.0–34.0)
MCHC: 33.5 g/dL (ref 30.0–36.0)
MCV: 76.7 fL — ABNORMAL LOW (ref 80.0–100.0)
Monocytes Absolute: 0.7 10*3/uL (ref 0.1–1.0)
Monocytes Relative: 10 %
Neutro Abs: 3.5 10*3/uL (ref 1.7–7.7)
Neutrophils Relative %: 49 %
Platelet Count: 180 10*3/uL (ref 150–400)
RBC: 6.57 MIL/uL — ABNORMAL HIGH (ref 4.22–5.81)
RDW: 18.7 % — ABNORMAL HIGH (ref 11.5–15.5)
WBC Count: 7.1 10*3/uL (ref 4.0–10.5)
nRBC: 0 % (ref 0.0–0.2)

## 2021-10-21 LAB — CMP (CANCER CENTER ONLY)
ALT: 28 U/L (ref 0–44)
AST: 28 U/L (ref 15–41)
Albumin: 4.2 g/dL (ref 3.5–5.0)
Alkaline Phosphatase: 53 U/L (ref 38–126)
Anion gap: 9 (ref 5–15)
BUN: 23 mg/dL (ref 8–23)
CO2: 21 mmol/L — ABNORMAL LOW (ref 22–32)
Calcium: 9.1 mg/dL (ref 8.9–10.3)
Chloride: 105 mmol/L (ref 98–111)
Creatinine: 1.2 mg/dL (ref 0.61–1.24)
GFR, Estimated: >60 mL/min (ref >60)
Glucose, Bld: 175 mg/dL — ABNORMAL HIGH (ref 70–99)
Potassium: 4.3 mmol/L (ref 3.5–5.1)
Sodium: 135 mmol/L (ref 135–145)
Total Bilirubin: 0.8 mg/dL (ref 0.3–1.2)
Total Protein: 6.8 g/dL (ref 6.5–8.1)

## 2021-10-21 LAB — LACTATE DEHYDROGENASE: LDH: 126 U/L (ref 98–192)

## 2021-10-21 NOTE — Progress Notes (Signed)
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 Negative MPL515 mutation, Negative BCR/ABL and negative JAK-2 exon 12.  PRIOR THERAPY: None  CURRENT THERAPY: Phlebotomy on as-needed basis.  INTERVAL HISTORY: [name]. 61 y.o. male returns to the clinic today for follow-up visit.  The patient is feeling fine today with no concerning complaints except for the mild fatigue and shortness of breath with exertion.  He has no chest pain, cough or hemoptysis.  He has no nausea, vomiting, diarrhea or constipation.  He denied having any headache or visual changes.  He has no weight loss or night sweats.  He denied having any fever or chills.  He is here today for evaluation and repeat blood work.   MEDICAL HISTORY: Past Medical History:  Diagnosis Date   Arthritis    Diabetes mellitus without complication ([place])    TYPE 2   Diverticulosis    Dyslipidemia    GERD (gastroesophageal reflux disease)    Herpes labialis    Hypertension    Longstanding persistent atrial fibrillation ([facility])    Metabolic syndrome    Obesity    Proteinuria    Psoriasis    Seborrheic dermatitis    Sleep apnea    very compliant with CPAP, PT NEEDS TO BRING OWN MACHINE   TIA (transient ischemic attack) 2009    ALLERGIES:  is allergic to morphine and related.  MEDICATIONS:  Current Outpatient Medications  Medication Sig Dispense Refill   carvedilol (COREG) 25 MG tablet Take 1 tablet (25 mg total) by mouth 2 (two) times daily. 180 tablet 3   clobetasol cream (TEMOVATE) 0.05 % APPLY TOPICALLY TO AFFECTED AREA TWICE DAILY. 30 g 0   doxycycline (VIBRA-TABS) 100 MG tablet Take 1 tablet (100 mg total) by mouth 2 (two) times daily. 180 tablet 3   empagliflozin (JARDIANCE) 10 MG TABS tablet  Take 1 tablet (10 mg total) by mouth daily. 90 tablet 0   glucose blood test strip 1 each by Other route as needed. Use as instructed Freestyle test stripts 100 each 3   glucose monitoring kit (FREESTYLE) monitoring kit 1 each by Does not apply route as needed. 1 each 0   Lancets (FREESTYLE) lancets 1 each by Other route as needed. Use as instructed 100 each 3   methocarbamol (ROBAXIN) 500 MG tablet Take 1 tablet (500 mg total) by mouth every 6 (six) hours as needed for muscle spasms. 40 tablet 0   Multiple Vitamin (MULTIVITAMIN WITH MINERALS) TABS tablet Take 1 tablet by mouth daily.     mupirocin ointment (BACTROBAN) 2 % mupirocin 2 % topical ointment  APPLY TOPICALLY TO THE AFFECTED AREA ON THE SKIN TWICE DAILY.     OTEZLA 30 MG TABS Take 1 tablet by mouth 2 (two) times daily.     Oxymetazoline HCl (NASAL SPRAY) 0.05 % SOLN Place into the nose.     pantoprazole (PROTONIX) 40 MG tablet Take 1 tablet (40 mg total) by mouth 2 (two) times daily. 270 tablet 3   rivaroxaban (XARELTO) 20 MG TABS tablet Take 1 tablet (20 mg total) by mouth daily with supper. 90 tablet 1   rosuvastatin (CRESTOR) 10 MG tablet Take 1 tablet (10 mg total) by mouth daily. 90 tablet 1   sacubitril-valsartan (ENTRESTO) 24-26 MG TAKE 1 TABLET  BY MOUTH 2 (TWO) TIMES DAILY. 180 tablet 3   TALTZ 80 MG/ML SOAJ Inject 1 ml (93m) under the skin every 4 weeks. 1 mL 10   Vitamin D, Cholecalciferol, 1000 units CAPS Take 1,000 Units by mouth daily.      No current facility-administered medications for this visit.    SURGICAL HISTORY:  Past Surgical History:  Procedure Laterality Date   CARDIOVERSION N/A 12/02/2016   Procedure: CARDIOVERSION;  Surgeon: KPixie Casino MD;  [facility]  Service: Cardiovascular;  Laterality: N/A;   COLONOSCOPY     EXCISIONAL TOTAL KNEE ARTHROPLASTY WITH ANTIBIOTIC SPACERS Right 03/28/2019   Procedure: EXCISIONAL TOTAL KNEE ARTHROPLASTY WITH ANTIBIOTIC SPACERS;  Surgeon: OParalee Cancel  MD;  Location: [facility];  Service: Orthopedics;  Laterality: Right;  90 mins   I & D KNEE WITH POLY EXCHANGE Right 09/10/2018   Procedure: Right Knee Arthroplasty IRRIGATION AND DEBRIDEMENT KNEE WITH POLY EXCHANGE;  Surgeon: OParalee Cancel MD;  Location: [facility];  Service: Orthopedics;  Laterality: Right;   I & D KNEE WITH POLY EXCHANGE Right 06/25/2020   Procedure: Open excisional and non excisional debridement right knee, possible aspiration versus open arthrotomy poly exchange;  Surgeon: OParalee Cancel MD;  Location: [facility];  Service: Orthopedics;  Laterality: Right;   IRRIGATION AND DEBRIDEMENT KNEE Right 07/09/2020   Procedure: IRRIGATION AND DEBRIDEMENT KNEE;  Surgeon: OParalee Cancel MD;  Location: [facility];  Service: Orthopedics;  Laterality: Right;   JOINT REPLACEMENT  11/03/10   LT HIP   PFO occluder cardiac valve  2006   Dr. GEinar Gip (hole in heart)   REIMPLANTATION OF TOTAL KNEE Right 06/15/2019   Procedure: Resection of antibiotic spacer and  irrigation and debridment and placement of new antibiotic spacer and components;  Surgeon: OParalee Cancel MD;  Location: [facility];  Service: Orthopedics;  Laterality: Right;  90 mins   REIMPLANTATION OF TOTAL KNEE Right 08/22/2019   Procedure: REIMPLANTATION OF TOTAL KNEE;  Surgeon: OParalee Cancel MD;  Location: [facility];  Service: Orthopedics;  Laterality: Right;  120 mins   SKIN BIOPSY Left 12/22/2019   epidermal inclusion cyst   TONSILLECTOMY AND ADENOIDECTOMY     TOTAL HIP ARTHROPLASTY Left    TOTAL KNEE ARTHROPLASTY Right 05/31/2018   Procedure: RIGHT TOTAL KNEE ARTHROPLASTY;  Surgeon: OParalee Cancel MD;  Location: [facility];  Service: Orthopedics;  Laterality: Right;  70 mins   TOTAL KNEE ARTHROPLASTY Left 06/28/2018   Procedure: LEFT TOTAL KNEE ARTHROPLASTY;  Surgeon: OParalee Cancel MD;  Location: [facility];  Service: Orthopedics;  Laterality: Left;  70 mins   WISDOM TOOTH EXTRACTION      REVIEW OF SYSTEMS:  A comprehensive review of systems was negative  except for: Constitutional: positive for fatigue Respiratory: positive for dyspnea on exertion   PHYSICAL EXAMINATION: General appearance: alert, cooperative, fatigued, and no distress Head: Normocephalic, without obvious abnormality, atraumatic Neck: no adenopathy, no JVD, supple, symmetrical, trachea midline, and thyroid not enlarged, symmetric, no tenderness/mass/nodules Lymph nodes: Cervical, supraclavicular, and axillary nodes normal. Resp: clear to auscultation bilaterally Back: symmetric, no curvature. ROM normal. No CVA tenderness. Cardio: regular rate and rhythm, S1, S2 normal, no murmur, click, rub or gallop GI: soft, non-tender; bowel sounds normal; no masses,  no organomegaly Extremities: extremities normal, atraumatic, no cyanosis or edema  ECOG PERFORMANCE STATUS: 1 - Symptomatic but completely ambulatory  Blood pressure 133/83, pulse (!) 54, temperature (!) 97.3 F (36.3 C), temperature source Tympanic, resp. rate 16, weight 280 lb 9.6 oz (  127.3 kg), SpO2 96 %.  LABORATORY DATA: Lab Results  Component Value Date   WBC 7.1 10/21/2021   HGB 16.9 10/21/2021   HCT 50.4 10/21/2021   MCV 76.7 (L) 10/21/2021   PLT 180 10/21/2021      Chemistry      Component Value Date/Time   NA 135 10/21/2021 1451   NA 141 05/27/2020 0916   NA 138 05/22/2015 1303   K 4.3 10/21/2021 1451   K 4.1 05/22/2015 1303   CL 105 10/21/2021 1451   CO2 21 (L) 10/21/2021 1451   CO2 23 05/22/2015 1303   BUN 23 10/21/2021 1451   BUN 22 05/27/2020 0916   BUN 17.4 05/22/2015 1303   CREATININE 1.20 10/21/2021 1451   CREATININE 1.18 09/03/2020 1620   CREATININE 1.1 05/22/2015 1303      Component Value Date/Time   CALCIUM 9.1 10/21/2021 1451   CALCIUM 9.4 05/22/2015 1303   ALKPHOS 53 10/21/2021 1451   ALKPHOS 73 05/22/2015 1303   AST 28 10/21/2021 1451   AST 24 05/22/2015 1303   ALT 28 10/21/2021 1451   ALT 30 05/22/2015 1303   BILITOT 0.8 10/21/2021 1451   BILITOT 0.77 05/22/2015 1303        RADIOGRAPHIC STUDIES: No results found.  ASSESSMENT AND PLAN: This is a very pleasant 61 years old white male with polycythemia highly suspicious for polycythemia vera but with negative JAK mutation.   He has been receiving phlebotomy on as-needed basis to keep his hematocrit between 45-50%. The patient is currently on observation and he is feeling fine with no concerning complaints. Repeat CBC showed hematocrit of 50.4%. I recommended for the patient to proceed with phlebotomy today. I will see him back for follow-up visit in 2 months for evaluation with repeat CBC, comprehensive metabolic panel and phlebotomy if needed. He was advised to call immediately if he has any other concerning symptoms in the interval. The patient voices understanding of current disease status and treatment options and is in agreement with the current care plan.  All questions were answered. The patient knows to call the clinic with any problems, questions or concerns. We can certainly see the patient much sooner if necessary.  The total time spent in the appointment was 20 minutes.  Disclaimer: This note was dictated with voice recognition software. Similar sounding words can inadvertently be transcribed and may not be corrected upon review.

## 2021-10-21 NOTE — Patient Instructions (Signed)

## 2021-10-21 NOTE — Progress Notes (Signed)
Therapeutic phlebotomy performed today per MD orders. Started at 1549 and ended at 9 with a total of 511g removed. A 16G phlebotomy kit was used on the left AC. Needle was removed, intact. Patient tolerated procedure well, fluids given, declined food. Observed for 20 minutes after procedure without incident. VSS, ambulatory to lobby.

## 2021-10-22 ENCOUNTER — Other Ambulatory Visit: Payer: 59

## 2021-10-22 ENCOUNTER — Ambulatory Visit: Payer: 59 | Admitting: Internal Medicine

## 2021-10-22 ENCOUNTER — Inpatient Hospital Stay: Payer: 59 | Admitting: Internal Medicine

## 2021-10-22 ENCOUNTER — Inpatient Hospital Stay: Payer: 59

## 2021-10-27 ENCOUNTER — Other Ambulatory Visit (HOSPITAL_COMMUNITY): Payer: Self-pay

## 2021-10-29 ENCOUNTER — Encounter: Payer: Self-pay | Admitting: Internal Medicine

## 2021-10-29 ENCOUNTER — Other Ambulatory Visit (HOSPITAL_COMMUNITY): Payer: Self-pay

## 2021-10-31 ENCOUNTER — Telehealth: Payer: Self-pay | Admitting: Internal Medicine

## 2021-10-31 NOTE — Telephone Encounter (Signed)
Scheduled per 02/21 los, patient has been called and notified. °

## 2021-11-18 ENCOUNTER — Other Ambulatory Visit (HOSPITAL_COMMUNITY): Payer: Self-pay

## 2021-11-19 ENCOUNTER — Encounter: Payer: Self-pay | Admitting: Internal Medicine

## 2021-11-19 ENCOUNTER — Other Ambulatory Visit (HOSPITAL_COMMUNITY): Payer: Self-pay

## 2021-11-19 DIAGNOSIS — H61022 Chronic perichondritis of left external ear: Secondary | ICD-10-CM | POA: Diagnosis not present

## 2021-11-19 DIAGNOSIS — Z85828 Personal history of other malignant neoplasm of skin: Secondary | ICD-10-CM | POA: Diagnosis not present

## 2021-11-19 DIAGNOSIS — L821 Other seborrheic keratosis: Secondary | ICD-10-CM | POA: Diagnosis not present

## 2021-11-19 DIAGNOSIS — L400 Psoriasis vulgaris: Secondary | ICD-10-CM | POA: Diagnosis not present

## 2021-11-19 DIAGNOSIS — L814 Other melanin hyperpigmentation: Secondary | ICD-10-CM | POA: Diagnosis not present

## 2021-11-19 DIAGNOSIS — D2271 Melanocytic nevi of right lower limb, including hip: Secondary | ICD-10-CM | POA: Diagnosis not present

## 2021-12-02 ENCOUNTER — Other Ambulatory Visit: Payer: Self-pay | Admitting: Cardiovascular Disease

## 2021-12-02 ENCOUNTER — Other Ambulatory Visit (HOSPITAL_COMMUNITY): Payer: Self-pay

## 2021-12-02 MED ORDER — ENTRESTO 24-26 MG PO TABS
1.0000 | ORAL_TABLET | Freq: Two times a day (BID) | ORAL | 3 refills | Status: DC
  Filled 2021-12-02: qty 180, 90d supply, fill #0
  Filled 2022-03-17: qty 180, 90d supply, fill #1
  Filled 2022-06-14: qty 180, 90d supply, fill #2
  Filled 2022-09-06: qty 180, 90d supply, fill #3

## 2021-12-08 ENCOUNTER — Encounter: Payer: Self-pay | Admitting: Family Medicine

## 2021-12-11 ENCOUNTER — Ambulatory Visit: Payer: 59 | Admitting: Family Medicine

## 2021-12-11 ENCOUNTER — Encounter: Payer: Self-pay | Admitting: Family Medicine

## 2021-12-11 ENCOUNTER — Other Ambulatory Visit (HOSPITAL_COMMUNITY): Payer: Self-pay

## 2021-12-11 DIAGNOSIS — M7918 Myalgia, other site: Secondary | ICD-10-CM

## 2021-12-11 MED ORDER — METHOCARBAMOL 500 MG PO TABS
500.0000 mg | ORAL_TABLET | Freq: Four times a day (QID) | ORAL | 0 refills | Status: DC | PRN
  Filled 2021-12-11: qty 40, 10d supply, fill #0
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
## 2021-12-11 NOTE — Patient Instructions (Signed)
Heat.  Tylenol 2 tablets 4 times per day and you can also take 2 Aleve twice per day.  Use the muscle relaxer as needed for the aches and pains and also potentially for sleep.  Do not take it during the day ?

## 2021-12-12 ENCOUNTER — Ambulatory Visit: Payer: 59 | Admitting: Family Medicine

## 2021-12-14 ENCOUNTER — Other Ambulatory Visit: Payer: Self-pay | Admitting: Family Medicine

## 2021-12-14 DIAGNOSIS — E1121 Type 2 diabetes mellitus with diabetic nephropathy: Secondary | ICD-10-CM

## 2021-12-15 ENCOUNTER — Other Ambulatory Visit (HOSPITAL_COMMUNITY): Payer: Self-pay

## 2021-12-15 ENCOUNTER — Encounter: Payer: Self-pay | Admitting: Internal Medicine

## 2021-12-15 MED ORDER — EMPAGLIFLOZIN 10 MG PO TABS
10.0000 mg | ORAL_TABLET | Freq: Every day | ORAL | 0 refills | Status: DC
  Filled 2021-12-15: qty 90, 90d supply, fill #0

## 2021-12-17 ENCOUNTER — Inpatient Hospital Stay: Payer: 59 | Admitting: Internal Medicine

## 2021-12-17 ENCOUNTER — Other Ambulatory Visit: Payer: Self-pay

## 2021-12-17 ENCOUNTER — Other Ambulatory Visit: Payer: 59

## 2021-12-17 ENCOUNTER — Ambulatory Visit: Payer: 59 | Admitting: Internal Medicine

## 2021-12-17 ENCOUNTER — Inpatient Hospital Stay: Payer: 59

## 2021-12-17 ENCOUNTER — Inpatient Hospital Stay: Payer: 59 | Attending: Internal Medicine

## 2021-12-17 VITALS — BP 128/84 | HR 91 | Temp 97.7°F | Resp 19 | Ht 70.0 in | Wt 285.2 lb

## 2021-12-17 DIAGNOSIS — G4733 Obstructive sleep apnea (adult) (pediatric): Secondary | ICD-10-CM | POA: Diagnosis not present

## 2021-12-17 DIAGNOSIS — D45 Polycythemia vera: Secondary | ICD-10-CM | POA: Diagnosis not present

## 2021-12-17 DIAGNOSIS — D751 Secondary polycythemia: Secondary | ICD-10-CM | POA: Insufficient documentation

## 2021-12-17 LAB — CBC WITH DIFFERENTIAL (CANCER CENTER ONLY)
Abs Immature Granulocytes: 0.03 10*3/uL (ref 0.00–0.07)
Basophils Absolute: 0 10*3/uL (ref 0.0–0.1)
Basophils Relative: 0 %
Eosinophils Absolute: 0.1 10*3/uL (ref 0.0–0.5)
Eosinophils Relative: 2 %
HCT: 47.4 % (ref 39.0–52.0)
Hemoglobin: 16.1 g/dL (ref 13.0–17.0)
Immature Granulocytes: 0 %
Lymphocytes Relative: 30 %
Lymphs Abs: 2.3 10*3/uL (ref 0.7–4.0)
MCH: 25.6 pg — ABNORMAL LOW (ref 26.0–34.0)
MCHC: 34 g/dL (ref 30.0–36.0)
MCV: 75.5 fL — ABNORMAL LOW (ref 80.0–100.0)
Monocytes Absolute: 0.6 10*3/uL (ref 0.1–1.0)
Monocytes Relative: 8 %
Neutro Abs: 4.5 10*3/uL (ref 1.7–7.7)
Neutrophils Relative %: 60 %
Platelet Count: 172 10*3/uL (ref 150–400)
RBC: 6.28 MIL/uL — ABNORMAL HIGH (ref 4.22–5.81)
RDW: 18.1 % — ABNORMAL HIGH (ref 11.5–15.5)
WBC Count: 7.6 10*3/uL (ref 4.0–10.5)
nRBC: 0 % (ref 0.0–0.2)

## 2021-12-17 LAB — CMP (CANCER CENTER ONLY)
ALT: 21 U/L (ref 0–44)
AST: 20 U/L (ref 15–41)
Albumin: 4 g/dL (ref 3.5–5.0)
Alkaline Phosphatase: 49 U/L (ref 38–126)
Anion gap: 8 (ref 5–15)
BUN: 23 mg/dL (ref 8–23)
CO2: 20 mmol/L — ABNORMAL LOW (ref 22–32)
Calcium: 9.3 mg/dL (ref 8.9–10.3)
Chloride: 106 mmol/L (ref 98–111)
Creatinine: 1.23 mg/dL (ref 0.61–1.24)
GFR, Estimated: >60 mL/min (ref >60)
Glucose, Bld: 179 mg/dL — ABNORMAL HIGH (ref 70–99)
Potassium: 3.9 mmol/L (ref 3.5–5.1)
Sodium: 134 mmol/L — ABNORMAL LOW (ref 135–145)
Total Bilirubin: 0.8 mg/dL (ref 0.3–1.2)
Total Protein: 6.6 g/dL (ref 6.5–8.1)

## 2021-12-17 NOTE — Progress Notes (Signed)
Therapeutic phlebotomy performed today per MD orders. Started at 1412 and ended at 52 with a total of 504g removed. A 16G phlebotomy kit was used on the left AC. Needle was removed, intact. Patient tolerated procedure well, fluids given, declined food. Observed for 30 minutes after procedure without incident. VSS, ambulatory to lobby. ?
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
## 2021-12-25 DIAGNOSIS — G4733 Obstructive sleep apnea (adult) (pediatric): Secondary | ICD-10-CM | POA: Diagnosis not present

## 2022-01-04 ENCOUNTER — Other Ambulatory Visit: Payer: Self-pay | Admitting: Family Medicine

## 2022-01-04 DIAGNOSIS — E1169 Type 2 diabetes mellitus with other specified complication: Secondary | ICD-10-CM

## 2022-01-05 ENCOUNTER — Other Ambulatory Visit (HOSPITAL_COMMUNITY): Payer: Self-pay

## 2022-01-05 MED ORDER — ROSUVASTATIN CALCIUM 10 MG PO TABS
10.0000 mg | ORAL_TABLET | Freq: Every day | ORAL | 1 refills | Status: DC
  Filled 2022-01-05: qty 90, 90d supply, fill #0
  Filled 2022-04-06: qty 90, 90d supply, fill #1

## 2022-01-06 ENCOUNTER — Telehealth: Payer: Self-pay | Admitting: Internal Medicine

## 2022-01-06 NOTE — Telephone Encounter (Signed)
Called patient regarding upcoming appointment, left a voicemail. 

## 2022-01-12 ENCOUNTER — Ambulatory Visit: Payer: 59 | Admitting: Internal Medicine

## 2022-01-12 ENCOUNTER — Other Ambulatory Visit (HOSPITAL_COMMUNITY): Payer: Self-pay

## 2022-01-12 ENCOUNTER — Other Ambulatory Visit: Payer: Self-pay

## 2022-01-12 VITALS — BP 116/80 | HR 88 | Resp 16 | Ht 70.0 in | Wt 280.0 lb

## 2022-01-12 DIAGNOSIS — L409 Psoriasis, unspecified: Secondary | ICD-10-CM

## 2022-01-12 DIAGNOSIS — T8453XD Infection and inflammatory reaction due to internal right knee prosthesis, subsequent encounter: Secondary | ICD-10-CM | POA: Diagnosis not present

## 2022-01-12 DIAGNOSIS — K529 Noninfective gastroenteritis and colitis, unspecified: Secondary | ICD-10-CM

## 2022-01-12 DIAGNOSIS — T8459XD Infection and inflammatory reaction due to other internal joint prosthesis, subsequent encounter: Secondary | ICD-10-CM

## 2022-01-12 DIAGNOSIS — Z96659 Presence of unspecified artificial knee joint: Secondary | ICD-10-CM

## 2022-01-12 MED ORDER — DOXYCYCLINE HYCLATE 100 MG PO TABS
100.0000 mg | ORAL_TABLET | Freq: Two times a day (BID) | ORAL | 3 refills | Status: DC
  Filled 2022-01-12 – 2022-03-15 (×2): qty 180, 90d supply, fill #0
  Filled 2022-06-11: qty 180, 90d supply, fill #1

## 2022-01-12 NOTE — Progress Notes (Signed)
Patient ID: [name]., male   DOB: 05/30/1961, 61 y.o.   MRN: [id number]  HPI  [place] to [place] to visit [place]. Drove 6hrs to [place] for [place]. Had food poisoning, nausea and vomiting/diarrhea - Friday morning -- but still drove down back home by Friday afternoon, rested Saturday, still slept much of weekend. About 80% -- recovering for gastroenteritis  Since surgery - has cold feet. Still has some numbness.  Still tolerating abtx  Imitrol OTC to help with nausea   Outpatient Encounter Medications as of 01/12/2022  Medication Sig   carvedilol (COREG) 25 MG tablet Take 1 tablet (25 mg total) by mouth 2 (two) times daily.   clobetasol cream (TEMOVATE) 0.05 % APPLY TOPICALLY TO AFFECTED AREA TWICE DAILY.   doxycycline (VIBRA-TABS) 100 MG tablet Take 1 tablet (100 mg total) by mouth 2 (two) times daily.   empagliflozin (JARDIANCE) 10 MG TABS tablet Take 1 tablet (10 mg total) by mouth daily.   glucose blood test strip 1 each by Other route as needed. Use as instructed Freestyle test stripts   glucose monitoring kit (FREESTYLE) monitoring kit 1 each by Does not apply route as needed.   Lancets (FREESTYLE) lancets 1 each by Other route as needed. Use as instructed   methocarbamol (ROBAXIN) 500 MG tablet Take 1 tablet (500 mg total) by mouth every 6 (six) hours as needed for muscle spasms.   Multiple Vitamin (MULTIVITAMIN WITH MINERALS) TABS tablet Take 1 tablet by mouth daily.   OTEZLA 30 MG TABS Take 1 tablet by mouth 2 (two) times daily.   Oxymetazoline HCl (NASAL SPRAY) 0.05 % SOLN Place into the nose.   pantoprazole (PROTONIX) 40 MG tablet Take 1 tablet (40 mg total) by mouth 2 (two) times daily.   rivaroxaban (XARELTO) 20 MG TABS tablet Take 1 tablet (20 mg total) by mouth daily with supper.   rosuvastatin (CRESTOR) 10 MG tablet Take 1 tablet (10 mg total) by mouth daily.   sacubitril-valsartan (ENTRESTO) 24-26 MG Take 1 tablet by mouth 2 (two) times daily.    TALTZ 80 MG/ML SOAJ Inject 1 ml (22m) under the skin every 4 weeks.   Vitamin D, Cholecalciferol, 1000 units CAPS Take 1,000 Units by mouth daily.    No facility-administered encounter medications on file as of 01/12/2022.     Patient Active Problem List   Diagnosis Date Noted   Hypoxemia 05/27/2021   Infection of prosthetic right knee joint ([facility] 153/74/8270  Chronic systolic CHF (congestive heart failure) ([facility] 09/27/2019   High risk medication use 11/08/2018   Infection of prosthetic knee joint ([place] [place] 09/10/2018   S/P left TKA 06/28/2018   S/P TKR (total knee replacement), right 05/31/2018   Tubular adenoma of colon 10/18/2017   BCE (basal cell epithelioma), face 06/21/2017   Diabetic nephropathy associated with type 2 diabetes mellitus ([facility] 10/13/2016   Permanent atrial fibrillation ([facility] 10/13/2016   Polycythemia vera ([facility] 10/09/2014   Hyperlipidemia associated with type 2 diabetes mellitus ([facility] 09/17/2014   Hypertension associated with diabetes ([facility] 09/17/2014   Diabetes mellitus ([facility] 10/31/2013   Hx-TIA (transient ischemic attack) 09/05/2012   GERD (gastroesophageal reflux disease) 08/17/2011   Obesity (BMI 30-39.9) 08/17/2011   Psoriatic arthritis ([facility] 08/17/2011   Obstructive sleep apnea 08/05/2009     There are no preventive care reminders to display for this patient.   Review of Systems Review of Systems  Constitutional: Negative for fever, chills, diaphoresis, activity change, appetite change, fatigue  and unexpected weight change.  HENT: Negative for congestion, sore throat, rhinorrhea, sneezing, trouble swallowing and sinus pressure.  Eyes: Negative for photophobia and visual disturbance.  Respiratory: Negative for cough, chest tightness, shortness of breath, wheezing and stridor.  Cardiovascular: Negative for chest pain, palpitations and leg swelling.  Gastrointestinal: Negative for nausea, vomiting, abdominal pain, diarrhea, constipation, blood in stool,  abdominal distention and anal bleeding.  Genitourinary: Negative for dysuria, hematuria, flank pain and difficulty urinating.  Musculoskeletal: Negative for myalgias, back pain, joint swelling, arthralgias and gait problem.  Skin: Negative for color change, pallor, rash and wound.  Neurological: Negative for dizziness, tremors, weakness and light-headedness.  Hematological: Negative for adenopathy. Does not bruise/bleed easily.  Psychiatric/Behavioral: Negative for behavioral problems, confusion, sleep disturbance, dysphoric mood, decreased concentration and agitation.   Physical Exam   BP 116/80   Pulse 88   Resp 16   Ht 5' 10"  (1.778 m)   Wt 280 lb (127 kg)   SpO2 97%   BMI 40.18 kg/m   Gen =a x o by 3 in nad Ext = knees well healed scar no effusion or erythema Skin = dry  CBC Lab Results  Component Value Date   WBC 7.6 12/17/2021   RBC 6.28 (H) 12/17/2021   HGB 16.1 12/17/2021   HCT 47.4 12/17/2021   PLT 172 12/17/2021   MCV 75.5 (L) 12/17/2021   MCH 25.6 (L) 12/17/2021   MCHC 34.0 12/17/2021   RDW 18.1 (H) 12/17/2021   LYMPHSABS 2.3 12/17/2021   MONOABS 0.6 12/17/2021   EOSABS 0.1 12/17/2021    BMET Lab Results  Component Value Date   NA 134 (L) 12/17/2021   K 3.9 12/17/2021   CL 106 12/17/2021   CO2 20 (L) 12/17/2021   GLUCOSE 179 (H) 12/17/2021   BUN 23 12/17/2021   CREATININE 1.23 12/17/2021   CALCIUM 9.3 12/17/2021   GFRNONAA >60 12/17/2021   GFRAA 74 05/27/2020      Assessment and Plan  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 See back in november  gastroenteritis = recovering. Continue to hydrate including gatorade  Psoriasis = continue with current regimen

## 2022-01-21 ENCOUNTER — Telehealth: Payer: Self-pay | Admitting: Family Medicine

## 2022-01-21 NOTE — Telephone Encounter (Signed)
Referral followup 

## 2022-01-24 DIAGNOSIS — G4733 Obstructive sleep apnea (adult) (pediatric): Secondary | ICD-10-CM | POA: Diagnosis not present

## 2022-01-30 ENCOUNTER — Other Ambulatory Visit (HOSPITAL_COMMUNITY): Payer: Self-pay

## 2022-02-02 ENCOUNTER — Other Ambulatory Visit (HOSPITAL_COMMUNITY): Payer: Self-pay

## 2022-02-04 ENCOUNTER — Other Ambulatory Visit (HOSPITAL_COMMUNITY): Payer: Self-pay

## 2022-02-06 ENCOUNTER — Other Ambulatory Visit (HOSPITAL_COMMUNITY): Payer: Self-pay

## 2022-02-06 ENCOUNTER — Encounter: Payer: Self-pay | Admitting: Family Medicine

## 2022-02-06 ENCOUNTER — Ambulatory Visit (INDEPENDENT_AMBULATORY_CARE_PROVIDER_SITE_OTHER): Payer: 59 | Admitting: Family Medicine

## 2022-02-06 VITALS — BP 112/76 | HR 84 | Temp 97.3°F | Ht 70.0 in | Wt 282.8 lb

## 2022-02-06 DIAGNOSIS — I5022 Chronic systolic (congestive) heart failure: Secondary | ICD-10-CM | POA: Diagnosis not present

## 2022-02-06 DIAGNOSIS — D126 Benign neoplasm of colon, unspecified: Secondary | ICD-10-CM | POA: Diagnosis not present

## 2022-02-06 DIAGNOSIS — Z96651 Presence of right artificial knee joint: Secondary | ICD-10-CM

## 2022-02-06 DIAGNOSIS — E669 Obesity, unspecified: Secondary | ICD-10-CM

## 2022-02-06 DIAGNOSIS — Z96659 Presence of unspecified artificial knee joint: Secondary | ICD-10-CM

## 2022-02-06 DIAGNOSIS — L4050 Arthropathic psoriasis, unspecified: Secondary | ICD-10-CM

## 2022-02-06 DIAGNOSIS — G4733 Obstructive sleep apnea (adult) (pediatric): Secondary | ICD-10-CM | POA: Diagnosis not present

## 2022-02-06 DIAGNOSIS — K219 Gastro-esophageal reflux disease without esophagitis: Secondary | ICD-10-CM

## 2022-02-06 DIAGNOSIS — E1169 Type 2 diabetes mellitus with other specified complication: Secondary | ICD-10-CM

## 2022-02-06 DIAGNOSIS — Z8673 Personal history of transient ischemic attack (TIA), and cerebral infarction without residual deficits: Secondary | ICD-10-CM

## 2022-02-06 DIAGNOSIS — I4821 Permanent atrial fibrillation: Secondary | ICD-10-CM

## 2022-02-06 DIAGNOSIS — Z0000 Encounter for general adult medical examination without abnormal findings: Secondary | ICD-10-CM | POA: Diagnosis not present

## 2022-02-06 DIAGNOSIS — E1121 Type 2 diabetes mellitus with diabetic nephropathy: Secondary | ICD-10-CM | POA: Diagnosis not present

## 2022-02-06 DIAGNOSIS — E785 Hyperlipidemia, unspecified: Secondary | ICD-10-CM | POA: Diagnosis not present

## 2022-02-06 DIAGNOSIS — Z96652 Presence of left artificial knee joint: Secondary | ICD-10-CM

## 2022-02-06 DIAGNOSIS — D45 Polycythemia vera: Secondary | ICD-10-CM

## 2022-02-06 DIAGNOSIS — E1159 Type 2 diabetes mellitus with other circulatory complications: Secondary | ICD-10-CM

## 2022-02-06 DIAGNOSIS — I152 Hypertension secondary to endocrine disorders: Secondary | ICD-10-CM

## 2022-02-06 DIAGNOSIS — T8459XD Infection and inflammatory reaction due to other internal joint prosthesis, subsequent encounter: Secondary | ICD-10-CM

## 2022-02-06 LAB — POCT GLYCOSYLATED HEMOGLOBIN (HGB A1C): Hemoglobin A1C: 7 % — AB (ref 4.0–5.6)

## 2022-02-06 LAB — POCT UA - MICROALBUMIN
Albumin/Creatinine Ratio, Urine, POC: 227
Creatinine, POC: 22.6 mg/dL
Microalbumin Ur, POC: 51.3 mg/L

## 2022-02-06 MED ORDER — TIRZEPATIDE 2.5 MG/0.5ML ~~LOC~~ SOAJ
2.5000 mg | SUBCUTANEOUS | 1 refills | Status: DC
  Filled 2022-02-06: qty 2, 28d supply, fill #0
  Filled 2022-02-25 – 2022-02-28 (×2): qty 2, 28d supply, fill #1

## 2022-02-06 NOTE — Progress Notes (Signed)
Complete physical exam  Patient: [name].   DOB: Aug 30, 1961   61 y.o. Male  MRN: [id number]  Subjective:     [name]. is a 61 y.o. male who presents today for a complete physical exam. He reports consuming a diabetic, unkown calorie diet.  Staying active at least at least  20 min a day.  He generally feels fairly well. He reports sleeping  well. He is interested in getting on a GLP-1 type medication.  Presently he is taking Jardiance.  He has tried various things in the past to help with his weight but has been unsuccessful with that.  He does have a previous history of TIA and did have a patch placed.  He also has A-fib and is also using Xarelto and Entresto he follows up regularly with cardiology.  His reflux seems to be under good control with every other day dosing of pantoprazole.  He has OSA and is on a BiPAP that he finds quite useful.  He does have an underlying psoriatic arthritis which is being monitored and controlled.  He is on [place].  He is also followed by ID for his chronic knee infection.  He has had multiple TKR procedures.  He sees Dr. Earlie Server for his polycythemia regularly.  His last colonoscopy showed no abnormal lesions.   Most recent fall risk assessment:    01/12/2022    8:52 AM  Fall Risk   Falls in the past year? 0  Number falls in past yr: 0  Injury with Fall? 0     Most recent depression screenings:    01/12/2022    8:52 AM 12/11/2021    9:31 AM  PHQ 2/9 Scores  PHQ - 2 Score 0 0      Patient Active Problem List   Diagnosis Date Noted   Chronic systolic CHF (congestive heart failure) ([place]) 09/27/2019   High risk medication use 11/08/2018   S/P left TKA 06/28/2018   S/P TKR (total knee replacement), right 05/31/2018   Tubular adenoma of colon 10/18/2017   BCE (basal cell epithelioma), face 06/21/2017   Diabetic nephropathy associated with type 2 diabetes mellitus ([place]) 10/13/2016   Permanent atrial fibrillation ([place]) 10/13/2016    Polycythemia vera ([place]) 10/09/2014   Hyperlipidemia associated with type 2 diabetes mellitus ([place]) 09/17/2014   Hypertension associated with diabetes ([place]) 09/17/2014   Diabetes mellitus ([place]) 10/31/2013   Hx-TIA (transient ischemic attack) 09/05/2012   GERD (gastroesophageal reflux disease) 08/17/2011   Obesity (BMI 30-39.9) 08/17/2011   Psoriatic arthritis ([place]) 08/17/2011   Obstructive sleep apnea 08/05/2009   Past Medical History:  Diagnosis Date   Arthritis    Diabetes mellitus without complication (HCC)    TYPE 2   Diverticulosis    Dyslipidemia    GERD (gastroesophageal reflux disease)    Herpes labialis    Hypertension    Longstanding persistent atrial fibrillation (HCC)    Metabolic syndrome    Obesity    Proteinuria    Psoriasis    Seborrheic dermatitis    Sleep apnea    very compliant with CPAP, PT NEEDS TO BRING OWN MACHINE   TIA (transient ischemic attack) 2009   Past Surgical History:  Procedure Laterality Date   CARDIOVERSION N/A 12/02/2016   Procedure: CARDIOVERSION;  Surgeon: Pixie Casino, MD;  [facility] END[facility]COPY;  Service: Cardiovascular;  Laterality: N/A;   COLONOSCOPY     EXCISIONAL TOTAL KNEE ARTHROPLASTY WITH ANTIBIOTIC SPACERS Right 03/28/2019  Procedure: EXCISIONAL TOTAL KNEE ARTHROPLASTY WITH ANTIBIOTIC SPACERS;  Surgeon: [name], MD;  Location: [facility] ORS;  Service: Orthopedics;  Laterality: Right;  90 mins   I & D KNEE WITH POLY EXCHANGE Right 09/10/2018   Procedure: Right Knee Arthroplasty IRRIGATION AND DEBRIDEMENT KNEE WITH POLY EXCHANGE;  Surgeon: [name], MD;  Location: [facility];  Service: Orthopedics;  Laterality: Right;   I & D KNEE WITH POLY EXCHANGE Right 06/25/2020   Procedure: Open excisional and non excisional debridement right knee, possible aspiration versus open arthrotomy poly exchange;  Surgeon: [name], MD;  Location: [facility];  Service: Orthopedics;  Laterality: Right;   IRRIGATION AND DEBRIDEMENT KNEE Right  07/09/2020   Procedure: IRRIGATION AND DEBRIDEMENT KNEE;  Surgeon: [name], MD;  Location: [facility];  Service: Orthopedics;  Laterality: Right;   JOINT REPLACEMENT  11/03/10   LT HIP   PFO occluder cardiac valve  2006   Dr. Einar Gip, (hole in heart)   REIMPLANTATION OF TOTAL KNEE Right 06/15/2019   Procedure: Resection of antibiotic spacer and  irrigation and debridment and placement of new antibiotic spacer and components;  Surgeon: [name], MD;  Location: [facility];  Service: Orthopedics;  Laterality: Right;  90 mins   REIMPLANTATION OF TOTAL KNEE Right 08/22/2019   Procedure: REIMPLANTATION OF TOTAL KNEE;  Surgeon: [name], MD;  [facility]: [facility];  Service: Orthopedics;  Laterality: Right;  120 mins   SKIN BIOPSY Left 12/22/2019   epidermal inclusion cyst   TONSILLECTOMY AND ADENOIDECTOMY     TOTAL HIP ARTHROPLASTY Left    TOTAL KNEE ARTHROPLASTY Right 05/31/2018   Procedure: RIGHT TOTAL KNEE ARTHROPLASTY;  Surgeon: [name], MD;  [facility];  Service: Orthopedics;  Laterality: Right;  70 mins   TOTAL KNEE ARTHROPLASTY Left 06/28/2018   Procedure: LEFT TOTAL KNEE ARTHROPLASTY;  Surgeon: [name], MD;  [facility];  Service: Orthopedics;  Laterality: Left;  70 mins   WISDOM TOOTH EXTRACTION     Social History   Tobacco Use   Smoking status: Never   Smokeless tobacco: Never  Vaping Use   Vaping Use: Never used  Substance Use Topics   Alcohol use: Yes    Comment: 2/month   Drug use: No   Family History  Problem Relation Age of Onset   Uterine cancer Mother 67       Cervical cath   Crohn's disease Mother    Esophageal cancer Father    Cancer Other    Obesity Other    Allergies  Allergen Reactions   Morphine And Related Other (See Comments)    Hallucinations      Patient Care Team: Denita Lung, MD as PCP - General [name], Wonda Cheng, MD as PCP - Cardiology (Cardiology)   Outpatient Medications Prior to Visit  Medication Sig   carvedilol  (COREG) 25 MG tablet Take 1 tablet (25 mg total) by mouth 2 (two) times daily.   clobetasol cream (TEMOVATE) 0.05 % APPLY TOPICALLY TO AFFECTED AREA TWICE DAILY.   doxycycline (VIBRA-TABS) 100 MG tablet Take 1 tablet (100 mg total) by mouth 2 (two) times daily.   empagliflozin (JARDIANCE) 10 MG TABS tablet Take 1 tablet (10 mg total) by mouth daily.   glucose blood test strip 1 each by Other route as needed. Use as instructed Freestyle test stripts   glucose monitoring kit (FREESTYLE) monitoring kit 1 each by Does not apply route as needed.   Lancets (FREESTYLE) lancets 1 each by Other  route as needed. Use as instructed   methocarbamol (ROBAXIN) 500 MG tablet Take 1 tablet (500 mg total) by mouth every 6 (six) hours as needed for muscle spasms.   Multiple Vitamin (MULTIVITAMIN WITH MINERALS) TABS tablet Take 1 tablet by mouth daily.   OTEZLA 30 MG TABS Take 1 tablet by mouth 2 (two) times daily.   Oxymetazoline HCl (NASAL SPRAY) 0.05 % SOLN Place into the nose.   pantoprazole (PROTONIX) 40 MG tablet Take 1 tablet (40 mg total) by mouth 2 (two) times daily.   rivaroxaban (XARELTO) 20 MG TABS tablet Take 1 tablet (20 mg total) by mouth daily with supper.   rosuvastatin (CRESTOR) 10 MG tablet Take 1 tablet (10 mg total) by mouth daily.   sacubitril-valsartan (ENTRESTO) 24-26 MG Take 1 tablet by mouth 2 (two) times daily.   TALTZ 80 MG/ML SOAJ Inject 1 ml (79m) under the skin every 4 weeks.   Vitamin D, Cholecalciferol, 1000 units CAPS Take 1,000 Units by mouth daily.    No facility-administered medications prior to visit.    Review of Systems  All other systems reviewed and are negative.         Objective:     There were no vitals taken for this visit. BP Readings from Last 3 Encounters:  01/12/22 116/80  12/17/21 123/86  12/17/21 128/84      Physical Exam   Alert and in no distress. Tympanic membranes and canals are normal. Pharyngeal area is normal. Neck is supple without  adenopathy or thyromegaly. Cardiac exam shows a regular sinus rhythm without murmurs or gallops. Lungs are clear to auscultation. Hemoglobin A1c is 7.0 Last CBC Lab Results  Component Value Date   WBC 7.6 12/17/2021   HGB 16.1 12/17/2021   HCT 47.4 12/17/2021   MCV 75.5 (L) 12/17/2021   MCH 25.6 (L) 12/17/2021   RDW 18.1 (H) 12/17/2021   PLT 172 076/54/6503  Last metabolic panel Lab Results  Component Value Date   GLUCOSE 179 (H) 12/17/2021   NA 134 (L) 12/17/2021   K 3.9 12/17/2021   CL 106 12/17/2021   CO2 20 (L) 12/17/2021   BUN 23 12/17/2021   CREATININE 1.23 12/17/2021   GFRNONAA >60 12/17/2021   CALCIUM 9.3 12/17/2021   PROT 6.6 12/17/2021   ALBUMIN 4.0 12/17/2021   LABGLOB 2.3 05/27/2020   AGRATIO 2.0 05/27/2020   BILITOT 0.8 12/17/2021   ALKPHOS 49 12/17/2021   AST 20 12/17/2021   ALT 21 12/17/2021   ANIONGAP 8 12/17/2021   Last lipids Lab Results  Component Value Date   CHOL 102 06/09/2021   HDL 42 06/09/2021   LDLCALC 38 06/09/2021   TRIG 120 06/09/2021   CHOLHDL 2.4 06/09/2021   Last hemoglobin A1c Lab Results  Component Value Date   HGBA1C 6.0 (A) 07/29/2021   Last vitamin D No results found for: "25OHVITD2", "25OHVITD3", "VD25OH"      Assessment & Plan:    Routine general medical examination at a health care facility  Chronic systolic CHF (congestive heart failure) ([place]  Hypertension associated with diabetes ([place]  Permanent atrial fibrillation ([place]  Obstructive sleep apnea  Gastroesophageal reflux disease without esophagitis  Tubular adenoma of colon  Type 2 diabetes mellitus with diabetic nephropathy, without long-term current use of insulin ([place] - Plan: POCT UA - Microalbumin, POCT glycosylated hemoglobin (Hb A1C), tirzepatide (MOUNJARO) 2.5 MG/0.5ML Pen  Diabetic nephropathy associated with type 2 diabetes mellitus ([place]  Hyperlipidemia associated with type 2 diabetes mellitus (  [place]) - Plan: Lipid panel  Psoriatic arthritis  ([place])  Hx-TIA (transient ischemic attack)  Polycythemia vera (HCC)  Obesity (BMI 30-39.9)  S/P left TKA  S/P TKR (total knee replacement), right  Infection of prosthetic knee joint, subsequent encounter  Immunization History  Administered Date(s) Administered   DTaP 08/05/1998   H1N1 08/20/2008   Influenza Split 08/20/2008, 08/17/2011, 09/05/2012   Influenza Whole 06/30/2010   Influenza,inj,Quad PF,6+ Mos 05/02/2013, 05/14/2014, 05/22/2015, 05/26/2016, 06/21/2017, 07/11/2018, 05/25/2019, 05/24/2020, 05/27/2021   Influenza-Unspecified 04/03/2019, 05/24/2020   PFIZER(Purple Top)SARS-COV-2 Vaccination 11/06/2019, 12/06/2019, 05/27/2020   PPD Test 08/17/2011   Pfizer Covid-19 Vaccine Bivalent Booster 84yr & up 07/14/2021   Pneumococcal Conjugate-13 05/14/2014   Pneumococcal Polysaccharide-23 05/22/2015   Tdap 08/17/2011, 11/25/2020   Zoster Recombinat (Shingrix) 05/27/2020, 11/25/2020    Health Maintenance  Topic Date Due   HEMOGLOBIN A1C  01/26/2022   HIV Screening  07/29/2022 (Originally 07/10/1976)   FOOT EXAM  03/28/2022   INFLUENZA VACCINE  03/31/2022   OPHTHALMOLOGY EXAM  07/14/2022   COLONOSCOPY (Pts 45-466yrInsurance coverage will need to be confirmed)  12/24/2022   TETANUS/TDAP  11/26/2030   COVID-19 Vaccine  Completed   Hepatitis C Screening  Completed   Zoster Vaccines- Shingrix  Completed   HPV VACCINES  Aged Out  I will place him on [place] Discussed benefits and potential side effects.  He will call me after 1 month and I will increase his dosing based on his side effects.  Return here in roughly 3 to 4 months.  He will continue on his BiPAP.  Did recommend that he try to switch to an H2 blocker for his reflux symptoms to see if that would also help.  Discussed health benefits of physical activity, and encouraged him to engage in regular exercise appropriate for his age and condition.     KiElyse JarvisRMA

## 2022-02-07 LAB — LIPID PANEL
Chol/HDL Ratio: 2.8 ratio (ref 0.0–5.0)
Cholesterol, Total: 105 mg/dL (ref 100–199)
HDL: 37 mg/dL — ABNORMAL LOW (ref >39)
LDL Chol Calc (NIH): 36 mg/dL (ref 0–99)
Triglycerides: 203 mg/dL — ABNORMAL HIGH (ref 0–149)
VLDL Cholesterol Cal: 32 mg/dL (ref 5–40)

## 2022-02-09 ENCOUNTER — Other Ambulatory Visit (HOSPITAL_COMMUNITY): Payer: Self-pay

## 2022-02-16 ENCOUNTER — Other Ambulatory Visit: Payer: Self-pay | Admitting: Lab

## 2022-02-16 ENCOUNTER — Other Ambulatory Visit: Payer: Self-pay | Admitting: Family Medicine

## 2022-02-16 ENCOUNTER — Other Ambulatory Visit: Payer: Self-pay

## 2022-02-16 ENCOUNTER — Inpatient Hospital Stay: Payer: 59

## 2022-02-16 ENCOUNTER — Inpatient Hospital Stay: Payer: 59 | Attending: Internal Medicine

## 2022-02-16 ENCOUNTER — Other Ambulatory Visit (HOSPITAL_COMMUNITY): Payer: Self-pay

## 2022-02-16 ENCOUNTER — Inpatient Hospital Stay (HOSPITAL_BASED_OUTPATIENT_CLINIC_OR_DEPARTMENT_OTHER): Payer: 59 | Admitting: Internal Medicine

## 2022-02-16 VITALS — BP 130/88 | HR 73 | Temp 98.2°F | Resp 18 | Ht 70.0 in | Wt 281.9 lb

## 2022-02-16 DIAGNOSIS — E119 Type 2 diabetes mellitus without complications: Secondary | ICD-10-CM | POA: Insufficient documentation

## 2022-02-16 DIAGNOSIS — D45 Polycythemia vera: Secondary | ICD-10-CM | POA: Diagnosis not present

## 2022-02-16 DIAGNOSIS — D751 Secondary polycythemia: Secondary | ICD-10-CM | POA: Insufficient documentation

## 2022-02-16 LAB — CBC WITH DIFFERENTIAL (CANCER CENTER ONLY)
Abs Immature Granulocytes: 0.02 10*3/uL (ref 0.00–0.07)
Basophils Absolute: 0 10*3/uL (ref 0.0–0.1)
Basophils Relative: 0 %
Eosinophils Absolute: 0.1 10*3/uL (ref 0.0–0.5)
Eosinophils Relative: 1 %
HCT: 46 % (ref 39.0–52.0)
Hemoglobin: 15.8 g/dL (ref 13.0–17.0)
Immature Granulocytes: 0 %
Lymphocytes Relative: 28 %
Lymphs Abs: 1.6 10*3/uL (ref 0.7–4.0)
MCH: 25.7 pg — ABNORMAL LOW (ref 26.0–34.0)
MCHC: 34.3 g/dL (ref 30.0–36.0)
MCV: 74.9 fL — ABNORMAL LOW (ref 80.0–100.0)
Monocytes Absolute: 0.5 10*3/uL (ref 0.1–1.0)
Monocytes Relative: 8 %
Neutro Abs: 3.5 10*3/uL (ref 1.7–7.7)
Neutrophils Relative %: 63 %
Platelet Count: 177 10*3/uL (ref 150–400)
RBC: 6.14 MIL/uL — ABNORMAL HIGH (ref 4.22–5.81)
RDW: 18.6 % — ABNORMAL HIGH (ref 11.5–15.5)
WBC Count: 5.6 10*3/uL (ref 4.0–10.5)
nRBC: 0 % (ref 0.0–0.2)

## 2022-02-16 LAB — CMP (CANCER CENTER ONLY)
ALT: 22 U/L (ref 0–44)
AST: 22 U/L (ref 15–41)
Albumin: 4.1 g/dL (ref 3.5–5.0)
Alkaline Phosphatase: 48 U/L (ref 38–126)
Anion gap: 7 (ref 5–15)
BUN: 16 mg/dL (ref 8–23)
CO2: 21 mmol/L — ABNORMAL LOW (ref 22–32)
Calcium: 9.3 mg/dL (ref 8.9–10.3)
Chloride: 108 mmol/L (ref 98–111)
Creatinine: 1.09 mg/dL (ref 0.61–1.24)
GFR, Estimated: >60 mL/min (ref >60)
Glucose, Bld: 130 mg/dL — ABNORMAL HIGH (ref 70–99)
Potassium: 3.9 mmol/L (ref 3.5–5.1)
Sodium: 136 mmol/L (ref 135–145)
Total Bilirubin: 1 mg/dL (ref 0.3–1.2)
Total Protein: 6.7 g/dL (ref 6.5–8.1)

## 2022-02-16 MED ORDER — OTEZLA 30 MG PO TABS
1.0000 | ORAL_TABLET | Freq: Two times a day (BID) | ORAL | 1 refills | Status: DC
  Filled 2022-02-16: qty 60, 30d supply, fill #0

## 2022-02-16 NOTE — Progress Notes (Signed)
[facility] [phone]   Fax:([phone]) [address] [name] [address]  DIAGNOSIS: Persistent polycythemia highly suspicious for polycythemia vera with negative JAK-2 mutation. Negative MPL515 mutation, Negative BCR/ABL and negative JAK-2 exon 12.  PRIOR THERAPY: None  CURRENT THERAPY: Phlebotomy on as-needed basis.  INTERVAL HISTORY: [name]. 61 y.o. male returns to the clinic today for follow-up visit.  The patient is feeling fine today with no concerning complaints.  He started treatment with [facility] for his diabetes and he lost few pounds already.  He denied having any current chest pain, shortness of breath except with exertion with no cough or hemoptysis.  He denied having any fever or chills.  He has no nausea, vomiting, diarrhea or constipation.  He has no headache or visual changes.  He is here today for evaluation and repeat blood work.  MEDICAL HISTORY: Past Medical History:  Diagnosis Date   Arthritis    Diabetes mellitus without complication ([place])    TYPE 2   Diverticulosis    Dyslipidemia    GERD (gastroesophageal reflux disease)    Herpes labialis    Hypertension    Longstanding persistent atrial fibrillation (HCC)    Metabolic syndrome    Obesity    Proteinuria    Psoriasis    Seborrheic dermatitis    Sleep apnea    very compliant with CPAP, PT NEEDS TO BRING OWN MACHINE   TIA (transient ischemic attack) 2009    ALLERGIES:  is allergic to morphine and related.  MEDICATIONS:  Current Outpatient Medications  Medication Sig Dispense Refill   carvedilol (COREG) 25 MG tablet Take 1 tablet (25 mg total) by mouth 2 (two) times daily. 180 tablet 3   clobetasol cream (TEMOVATE) 0.05 % APPLY TOPICALLY TO AFFECTED AREA TWICE DAILY. 30 g 0   doxycycline (VIBRA-TABS) 100 MG tablet Take 1 tablet (100 mg total) by mouth 2 (two) times daily. 180 tablet 3    empagliflozin (JARDIANCE) 10 MG TABS tablet Take 1 tablet (10 mg total) by mouth daily. 90 tablet 0   glucose blood test strip 1 each by Other route as needed. Use as instructed Freestyle test stripts 100 each 3   glucose monitoring kit (FREESTYLE) monitoring kit 1 each by Does not apply route as needed. 1 each 0   Lancets (FREESTYLE) lancets 1 each by Other route as needed. Use as instructed 100 each 3   methocarbamol (ROBAXIN) 500 MG tablet Take 1 tablet (500 mg total) by mouth every 6 (six) hours as needed for muscle spasms. (Patient not taking: Reported on 02/06/2022) 40 tablet 0   Multiple Vitamin (MULTIVITAMIN WITH MINERALS) TABS tablet Take 1 tablet by mouth daily.     OTEZLA 30 MG TABS Take 1 tablet by mouth 2 (two) times daily.     Oxymetazoline HCl (NASAL SPRAY) 0.05 % SOLN Place into the nose.     pantoprazole (PROTONIX) 40 MG tablet Take 1 tablet (40 mg total) by mouth 2 (two) times daily. 270 tablet 3   rivaroxaban (XARELTO) 20 MG TABS tablet Take 1 tablet (20 mg total) by mouth daily with supper. 90 tablet 1   rosuvastatin (CRESTOR) 10 MG tablet Take 1 tablet (10 mg total) by mouth daily. 90 tablet 1   sacubitril-valsartan (ENTRESTO) 24-26 MG Take 1 tablet by mouth 2 (two) times daily. 180 tablet 3   TALTZ 80 MG/ML SOAJ Inject 1  ml (44m) under the skin every 4 weeks. 1 mL 10   tirzepatide (MOUNJARO) 2.5 MG/0.5ML Pen Inject 2.5 mg into the skin once a week. 2 mL 1   Vitamin D, Cholecalciferol, 1000 units CAPS Take 1,000 Units by mouth daily.      No current facility-administered medications for this visit.    SURGICAL HISTORY:  Past Surgical History:  Procedure Laterality Date   CARDIOVERSION N/A 12/02/2016   Procedure: CARDIOVERSION;  Surgeon: KPixie Casino MD;  [facility];  Service: Cardiovascular;  Laterality: N/A;   COLONOSCOPY     EXCISIONAL TOTAL KNEE ARTHROPLASTY WITH ANTIBIOTIC SPACERS Right 03/28/2019   Procedure: EXCISIONAL TOTAL KNEE ARTHROPLASTY WITH  ANTIBIOTIC SPACERS;  Surgeon: OParalee Cancel MD;  [facility];  Service: Orthopedics;  Laterality: Right;  90 mins   I & D KNEE WITH POLY EXCHANGE Right 09/10/2018   Procedure: Right Knee Arthroplasty IRRIGATION AND DEBRIDEMENT KNEE WITH POLY EXCHANGE;  Surgeon: OParalee Cancel MD;  [facility];  Service: Orthopedics;  Laterality: Right;   I & D KNEE WITH POLY EXCHANGE Right 06/25/2020   Procedure: Open excisional and non excisional debridement right knee, possible aspiration versus open arthrotomy poly exchange;  Surgeon: OParalee Cancel MD;  [facility];  Service: Orthopedics;  Laterality: Right;   IRRIGATION AND DEBRIDEMENT KNEE Right 07/09/2020   Procedure: IRRIGATION AND DEBRIDEMENT KNEE;  Surgeon: OParalee Cancel MD;  [facility];  Service: Orthopedics;  Laterality: Right;   JOINT REPLACEMENT  11/03/10   LT HIP   PFO occluder cardiac valve  2006   Dr. GEinar Gip (hole in heart)   REIMPLANTATION OF TOTAL KNEE Right 06/15/2019   Procedure: Resection of antibiotic spacer and  irrigation and debridment and placement of new antibiotic spacer and components;  Surgeon: OParalee Cancel MD;  [facility];  Service: Orthopedics;  Laterality: Right;  90 mins   REIMPLANTATION OF TOTAL KNEE Right 08/22/2019   Procedure: REIMPLANTATION OF TOTAL KNEE;  Surgeon: OParalee Cancel MD;  [facility];  Service: Orthopedics;  Laterality: Right;  120 mins   SKIN BIOPSY Left 12/22/2019   epidermal inclusion cyst   TONSILLECTOMY AND ADENOIDECTOMY     TOTAL HIP ARTHROPLASTY Left    TOTAL KNEE ARTHROPLASTY Right 05/31/2018   Procedure: RIGHT TOTAL KNEE ARTHROPLASTY;  Surgeon: OParalee Cancel MD;  [facility];  Service: Orthopedics;  Laterality: Right;  70 mins   TOTAL KNEE ARTHROPLASTY Left 06/28/2018   Procedure: LEFT TOTAL KNEE ARTHROPLASTY;  Surgeon: OParalee Cancel MD;  [facility];  Service: Orthopedics;  Laterality: Left;  70 mins   WISDOM TOOTH EXTRACTION      REVIEW OF SYSTEMS:  A  comprehensive review of systems was negative except for: Constitutional: positive for fatigue Musculoskeletal: positive for arthralgias   PHYSICAL EXAMINATION: General appearance: alert, cooperative, fatigued, and no distress Head: Normocephalic, without obvious abnormality, atraumatic Neck: no adenopathy, no JVD, supple, symmetrical, trachea midline, and thyroid not enlarged, symmetric, no tenderness/mass/nodules Lymph nodes: Cervical, supraclavicular, and axillary nodes normal. Resp: clear to auscultation bilaterally Back: symmetric, no curvature. ROM normal. No CVA tenderness. Cardio: regular rate and rhythm, S1, S2 normal, no murmur, click, rub or gallop GI: soft, non-tender; bowel sounds normal; no masses,  no organomegaly Extremities: extremities normal, atraumatic, no cyanosis or edema  ECOG PERFORMANCE STATUS: 1 - Symptomatic but completely ambulatory  Blood pressure 130/88, pulse 73, temperature 98.2 F (36.8 C), temperature source Oral, resp. rate 18, height 5' 10"  (1.778 m), weight 281  lb 14.4 oz (127.9 kg), SpO2 96 %.  LABORATORY DATA: Lab Results  Component Value Date   WBC 5.6 02/16/2022   HGB 15.8 02/16/2022   HCT 46.0 02/16/2022   MCV 74.9 (L) 02/16/2022   PLT 177 02/16/2022      Chemistry      Component Value Date/Time   NA 134 (L) 12/17/2021 1253   NA 141 05/27/2020 0916   NA 138 05/22/2015 1303   K 3.9 12/17/2021 1253   K 4.1 05/22/2015 1303   CL 106 12/17/2021 1253   CO2 20 (L) 12/17/2021 1253   CO2 23 05/22/2015 1303   BUN 23 12/17/2021 1253   BUN 22 05/27/2020 0916   BUN 17.4 05/22/2015 1303   CREATININE 1.23 12/17/2021 1253   CREATININE 1.18 09/03/2020 1620   CREATININE 1.1 05/22/2015 1303      Component Value Date/Time   CALCIUM 9.3 12/17/2021 1253   CALCIUM 9.4 05/22/2015 1303   ALKPHOS 49 12/17/2021 1253   ALKPHOS 73 05/22/2015 1303   AST 20 12/17/2021 1253   AST 24 05/22/2015 1303   ALT 21 12/17/2021 1253   ALT 30 05/22/2015 1303    BILITOT 0.8 12/17/2021 1253   BILITOT 0.77 05/22/2015 1303       RADIOGRAPHIC STUDIES: No results found.  ASSESSMENT AND PLAN: This is a very pleasant 61 years old white male with polycythemia highly suspicious for polycythemia vera but with negative JAK mutation.   He has been receiving phlebotomy on as-needed basis to keep his hematocrit between 45-50%. The patient is currently on observation and he is doing fine. His hematocrit today is 46%. I recommended for the patient to continue on observation with repeat blood work and phlebotomy if needed in 2 months. He was advised to call immediately if he has any other concerning symptoms in the interval. The patient voices understanding of current disease status and treatment options and is in agreement with the current care plan. All questions were answered. The patient knows to call the clinic with any problems, questions or concerns. We can certainly see the patient much sooner if necessary.  The total time spent in the appointment was 20 minutes.  Disclaimer: This note was dictated with voice recognition software. Similar sounding words can inadvertently be transcribed and may not be corrected upon review.

## 2022-02-17 ENCOUNTER — Other Ambulatory Visit (HOSPITAL_COMMUNITY): Payer: Self-pay

## 2022-02-19 ENCOUNTER — Telehealth: Payer: Self-pay | Admitting: Family Medicine

## 2022-02-19 ENCOUNTER — Other Ambulatory Visit (HOSPITAL_COMMUNITY): Payer: Self-pay

## 2022-02-19 ENCOUNTER — Telehealth: Payer: Self-pay | Admitting: Pharmacist

## 2022-02-19 NOTE — Telephone Encounter (Signed)
Cyril Mourning from the [facility] called and stated Denny was concerned about being on [place] because years ago it did not work for him. Thought he had been switched to another medication.

## 2022-02-24 ENCOUNTER — Ambulatory Visit: Payer: 59 | Attending: Family Medicine | Admitting: Pharmacist

## 2022-02-24 DIAGNOSIS — G4733 Obstructive sleep apnea (adult) (pediatric): Secondary | ICD-10-CM | POA: Diagnosis not present

## 2022-02-24 DIAGNOSIS — Z79899 Other long term (current) drug therapy: Secondary | ICD-10-CM

## 2022-02-25 ENCOUNTER — Other Ambulatory Visit: Payer: Self-pay | Admitting: Cardiovascular Disease

## 2022-02-25 ENCOUNTER — Other Ambulatory Visit: Payer: Self-pay | Admitting: Family Medicine

## 2022-02-25 DIAGNOSIS — I4821 Permanent atrial fibrillation: Secondary | ICD-10-CM

## 2022-02-25 DIAGNOSIS — K219 Gastro-esophageal reflux disease without esophagitis: Secondary | ICD-10-CM

## 2022-02-25 NOTE — Telephone Encounter (Signed)
Pt advised he is taking taltz and does not want to change medications. [place]

## 2022-02-26 ENCOUNTER — Other Ambulatory Visit (HOSPITAL_COMMUNITY): Payer: Self-pay

## 2022-02-26 MED ORDER — RIVAROXABAN 20 MG PO TABS
20.0000 mg | ORAL_TABLET | Freq: Every day | ORAL | 1 refills | Status: DC
  Filled 2022-02-26: qty 90, 90d supply, fill #0
  Filled 2022-05-28: qty 90, 90d supply, fill #1

## 2022-02-26 MED ORDER — PANTOPRAZOLE SODIUM 40 MG PO TBEC
40.0000 mg | DELAYED_RELEASE_TABLET | Freq: Two times a day (BID) | ORAL | 1 refills | Status: DC
  Filled 2022-02-26: qty 180, 90d supply, fill #0
  Filled 2022-05-28: qty 180, 90d supply, fill #1

## 2022-02-26 NOTE — Telephone Encounter (Signed)
Xarelto '20mg'$  refill request received. Pt is 61 years old, weight-127.9kg, Crea- 1.09 on 02/16/2022, last seen by Dr. Acie Fredrickson on 06/09/2021, Diagnosis-Afib, CrCl-127.42m/min; Dose is appropriate based on dosing criteria. Will send in refill to requested pharmacy.

## 2022-02-28 ENCOUNTER — Other Ambulatory Visit (HOSPITAL_COMMUNITY): Payer: Self-pay

## 2022-03-02 ENCOUNTER — Encounter: Payer: Self-pay | Admitting: Family Medicine

## 2022-03-02 ENCOUNTER — Other Ambulatory Visit (HOSPITAL_COMMUNITY): Payer: Self-pay

## 2022-03-05 ENCOUNTER — Other Ambulatory Visit (HOSPITAL_COMMUNITY): Payer: Self-pay

## 2022-03-06 ENCOUNTER — Telehealth: Payer: Self-pay | Admitting: Internal Medicine

## 2022-03-06 NOTE — Telephone Encounter (Signed)
Called patient regarding upcoming August appointments, left a voicemail. 

## 2022-03-10 ENCOUNTER — Other Ambulatory Visit (HOSPITAL_COMMUNITY): Payer: Self-pay

## 2022-03-11 ENCOUNTER — Encounter: Payer: Self-pay | Admitting: Family Medicine
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
## 2022-03-13 ENCOUNTER — Ambulatory Visit
Admission: RE | Admit: 2022-03-13 | Discharge: 2022-03-13 | Disposition: A | Discharge status: Home / Self Care | Payer: 59 | Source: Ambulatory Visit | Attending: Family Medicine | Admitting: Family Medicine

## 2022-03-13 ENCOUNTER — Other Ambulatory Visit (HOSPITAL_COMMUNITY): Payer: Self-pay

## 2022-03-13 ENCOUNTER — Ambulatory Visit: Payer: 59 | Admitting: Family Medicine

## 2022-03-13 ENCOUNTER — Ambulatory Visit: Payer: 59 | Attending: Family Medicine

## 2022-03-13 VITALS — BP 112/60 | HR 65 | Temp 97.7°F | Wt 276.0 lb

## 2022-03-13 DIAGNOSIS — M542 Cervicalgia: Secondary | ICD-10-CM

## 2022-03-13 DIAGNOSIS — Y939 Activity, unspecified: Secondary | ICD-10-CM | POA: Insufficient documentation

## 2022-03-13 DIAGNOSIS — Y929 Unspecified place or not applicable: Secondary | ICD-10-CM | POA: Insufficient documentation

## 2022-03-13 DIAGNOSIS — I951 Orthostatic hypotension: Secondary | ICD-10-CM

## 2022-03-13 DIAGNOSIS — M7918 Myalgia, other site: Secondary | ICD-10-CM | POA: Insufficient documentation

## 2022-03-13 DIAGNOSIS — M47812 Spondylosis without myelopathy or radiculopathy, cervical region: Secondary | ICD-10-CM | POA: Diagnosis not present

## 2022-03-13 DIAGNOSIS — R293 Abnormal posture: Secondary | ICD-10-CM | POA: Insufficient documentation

## 2022-03-13 DIAGNOSIS — M5459 Other low back pain: Secondary | ICD-10-CM | POA: Diagnosis not present

## 2022-03-13 DIAGNOSIS — M6281 Muscle weakness (generalized): Secondary | ICD-10-CM

## 2022-03-13 MED ORDER — TRAMADOL HCL 50 MG PO TABS
50.0000 mg | ORAL_TABLET | Freq: Three times a day (TID) | ORAL | 0 refills | Status: AC | PRN
  Filled 2022-03-13: qty 30, 10d supply, fill #0

## 2022-03-13 MED ORDER — METHOCARBAMOL 500 MG PO TABS
500.0000 mg | ORAL_TABLET | Freq: Four times a day (QID) | ORAL | 0 refills | Status: DC | PRN
  Filled 2022-03-13: qty 40, 10d supply, fill #0

## 2022-03-13 NOTE — Patient Instructions (Signed)
you can take 2 Tylenol 4 times per day or extra throwing would be 2 tablets 3 times per day.  Use the muscle relaxer more often and use the tramadol as needed

## 2022-03-13 NOTE — Progress Notes (Signed)
   Subjective:    Patient ID: William Barges., male    DOB: 1960-12-15, 61 y.o.   MRN: [id number]  HPI He is here for evaluation of neck pain.  He states he originally injured his back and neck March 16 in the fall.  Since then the back is gotten better but in the last several weeks his neck has gotten much worse especially if he moves his head in either direction he also notes a clicking sensation.  No numbness, tingling or weakness.  It is now interfering with his ADLs.  He has been using Tylenol as well as a muscle relaxer and getting minimal relief of his symptoms.  He is scheduled for physical therapy to help with his neck.  He also describes that if he goes from a sitting to a standing position he will have slight dizziness that goes away within a few seconds.   Review of Systems     Objective:   Physical Exam He complains of pain on motion of the neck in any direction.  Normal motor, sensory and DTRs.       Assessment & Plan:  Neck pain - Plan: DG Cervical Spine Complete, traMADol (ULTRAM) 50 MG tablet  Myalgia, multiple sites - Plan: methocarbamol (ROBAXIN) 500 MG tablet  Postural hypotension He will continue to use Tylenol and also the muscle relaxer on an as-needed basis.  Discussed pain management with him.  He has had difficulty with codeine in the past but I think is worthwhile trying him on the tramadol to see if it will give him extra pain relief.  Follow-up after x-rays.  I anticipate seeing arthritic changes however if anything else shows up we will certainly handle it as indicated.  If this is not successful I discussed possible MRI. Explained the hypotension to him.  We might need to readjust his blood pressure medicines but not at the present time.

## 2022-03-16 ENCOUNTER — Ambulatory Visit: Payer: 59

## 2022-03-16 ENCOUNTER — Other Ambulatory Visit (HOSPITAL_COMMUNITY): Payer: Self-pay

## 2022-03-16 DIAGNOSIS — M542 Cervicalgia: Secondary | ICD-10-CM

## 2022-03-16 DIAGNOSIS — M7918 Myalgia, other site: Secondary | ICD-10-CM | POA: Diagnosis not present

## 2022-03-16 DIAGNOSIS — R293 Abnormal posture: Secondary | ICD-10-CM | POA: Diagnosis not present

## 2022-03-16 DIAGNOSIS — M5459 Other low back pain: Secondary | ICD-10-CM | POA: Diagnosis not present

## 2022-03-16 DIAGNOSIS — M6281 Muscle weakness (generalized): Secondary | ICD-10-CM
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
## 2022-03-16 NOTE — Patient Instructions (Signed)

## 2022-03-18 ENCOUNTER — Other Ambulatory Visit (HOSPITAL_COMMUNITY): Payer: Self-pay
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
## 2022-03-25 ENCOUNTER — Encounter: Payer: Self-pay | Admitting: Physical Therapy

## 2022-03-25 ENCOUNTER — Ambulatory Visit: Payer: 59 | Admitting: Physical Therapy

## 2022-03-25 ENCOUNTER — Other Ambulatory Visit: Payer: Self-pay

## 2022-03-25 DIAGNOSIS — R293 Abnormal posture: Secondary | ICD-10-CM

## 2022-03-25 DIAGNOSIS — M542 Cervicalgia: Secondary | ICD-10-CM

## 2022-03-25 DIAGNOSIS — M7918 Myalgia, other site: Secondary | ICD-10-CM | POA: Diagnosis not present

## 2022-03-25 DIAGNOSIS — M5459 Other low back pain: Secondary | ICD-10-CM | POA: Diagnosis not present

## 2022-03-25 DIAGNOSIS — M6281 Muscle weakness (generalized): Secondary | ICD-10-CM

## 2022-03-25 DIAGNOSIS — G4733 Obstructive sleep apnea (adult) (pediatric): Secondary | ICD-10-CM | POA: Diagnosis not present

## 2022-03-26 DIAGNOSIS — G4733 Obstructive sleep apnea (adult) (pediatric): Secondary | ICD-10-CM | POA: Diagnosis not present

## 2022-03-27 ENCOUNTER — Ambulatory Visit: Payer: 59

## 2022-03-27 DIAGNOSIS — M5459 Other low back pain: Secondary | ICD-10-CM | POA: Diagnosis not present

## 2022-03-27 DIAGNOSIS — M542 Cervicalgia: Secondary | ICD-10-CM | POA: Diagnosis not present

## 2022-03-27 DIAGNOSIS — R293 Abnormal posture: Secondary | ICD-10-CM | POA: Diagnosis not present

## 2022-03-27 DIAGNOSIS — M6281 Muscle weakness (generalized): Secondary | ICD-10-CM

## 2022-03-27 DIAGNOSIS — M7918 Myalgia, other site: Secondary | ICD-10-CM | POA: Diagnosis not present
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
## 2022-03-28 ENCOUNTER — Other Ambulatory Visit: Payer: Self-pay | Admitting: Family Medicine

## 2022-03-28 DIAGNOSIS — E1121 Type 2 diabetes mellitus with diabetic nephropathy: Secondary | ICD-10-CM

## 2022-03-30 ENCOUNTER — Other Ambulatory Visit: Payer: Self-pay | Admitting: Family Medicine

## 2022-03-30 ENCOUNTER — Other Ambulatory Visit (HOSPITAL_COMMUNITY): Payer: Self-pay

## 2022-03-30 DIAGNOSIS — E1121 Type 2 diabetes mellitus with diabetic nephropathy: Secondary | ICD-10-CM

## 2022-03-30 MED ORDER — MOUNJARO 2.5 MG/0.5ML ~~LOC~~ SOAJ
2.5000 mg | SUBCUTANEOUS | 0 refills | Status: DC
  Filled 2022-03-30: qty 2, 28d supply, fill #0

## 2022-04-01 ENCOUNTER — Other Ambulatory Visit (HOSPITAL_COMMUNITY): Payer: Self-pay

## 2022-04-01 ENCOUNTER — Ambulatory Visit: Payer: 59 | Attending: Family Medicine

## 2022-04-01 ENCOUNTER — Other Ambulatory Visit: Payer: Self-pay | Admitting: Family Medicine

## 2022-04-01 DIAGNOSIS — R293 Abnormal posture: Secondary | ICD-10-CM | POA: Insufficient documentation

## 2022-04-01 DIAGNOSIS — M6281 Muscle weakness (generalized): Secondary | ICD-10-CM | POA: Insufficient documentation

## 2022-04-01 DIAGNOSIS — M542 Cervicalgia: Secondary | ICD-10-CM | POA: Insufficient documentation

## 2022-04-01 DIAGNOSIS — M5459 Other low back pain: Secondary | ICD-10-CM | POA: Diagnosis not present

## 2022-04-01 DIAGNOSIS — E1121 Type 2 diabetes mellitus with diabetic nephropathy: Secondary | ICD-10-CM

## 2022-04-01 MED ORDER — EMPAGLIFLOZIN 10 MG PO TABS
10.0000 mg | ORAL_TABLET | Freq: Every day | ORAL | 1 refills | Status: DC
  Filled 2022-04-01: qty 90, 90d supply, fill #0
  Filled 2022-05-28 – 2022-06-24 (×2): qty 90, 90d supply, fill #1
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
## 2022-04-02 ENCOUNTER — Other Ambulatory Visit (HOSPITAL_COMMUNITY): Payer: Self-pay
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
## 2022-04-03 ENCOUNTER — Ambulatory Visit: Payer: 59

## 2022-04-03 DIAGNOSIS — R293 Abnormal posture: Secondary | ICD-10-CM

## 2022-04-03 DIAGNOSIS — M5459 Other low back pain: Secondary | ICD-10-CM

## 2022-04-03 DIAGNOSIS — M6281 Muscle weakness (generalized): Secondary | ICD-10-CM

## 2022-04-03 DIAGNOSIS — M542 Cervicalgia: Secondary | ICD-10-CM

## 2022-04-06 ENCOUNTER — Other Ambulatory Visit (HOSPITAL_COMMUNITY): Payer: Self-pay
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
## 2022-04-08 ENCOUNTER — Encounter: Payer: Self-pay | Admitting: Physical Therapy

## 2022-04-08 ENCOUNTER — Other Ambulatory Visit: Payer: Self-pay

## 2022-04-08 ENCOUNTER — Ambulatory Visit: Payer: 59 | Admitting: Physical Therapy

## 2022-04-08 DIAGNOSIS — M5459 Other low back pain: Secondary | ICD-10-CM

## 2022-04-08 DIAGNOSIS — R293 Abnormal posture: Secondary | ICD-10-CM

## 2022-04-08 DIAGNOSIS — M6281 Muscle weakness (generalized): Secondary | ICD-10-CM

## 2022-04-08 DIAGNOSIS — M542 Cervicalgia: Secondary | ICD-10-CM | POA: Diagnosis not present
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
## 2022-04-10 ENCOUNTER — Ambulatory Visit: Payer: 59

## 2022-04-10 DIAGNOSIS — M6281 Muscle weakness (generalized): Secondary | ICD-10-CM

## 2022-04-10 DIAGNOSIS — R293 Abnormal posture: Secondary | ICD-10-CM | POA: Diagnosis not present

## 2022-04-10 DIAGNOSIS — M5459 Other low back pain: Secondary | ICD-10-CM

## 2022-04-10 DIAGNOSIS — M542 Cervicalgia: Secondary | ICD-10-CM | POA: Diagnosis not present

## 2022-04-14 ENCOUNTER — Inpatient Hospital Stay: Payer: 59 | Admitting: Internal Medicine

## 2022-04-14 ENCOUNTER — Encounter: Payer: Self-pay | Admitting: Internal Medicine

## 2022-04-14 ENCOUNTER — Other Ambulatory Visit: Payer: Self-pay

## 2022-04-14 ENCOUNTER — Inpatient Hospital Stay: Payer: 59 | Attending: Internal Medicine

## 2022-04-14 ENCOUNTER — Inpatient Hospital Stay: Payer: 59

## 2022-04-14 VITALS — BP 118/85 | HR 106 | Temp 98.3°F | Resp 16 | Ht 70.0 in | Wt 277.7 lb

## 2022-04-14 DIAGNOSIS — D751 Secondary polycythemia: Secondary | ICD-10-CM | POA: Insufficient documentation

## 2022-04-14 DIAGNOSIS — D45 Polycythemia vera: Secondary | ICD-10-CM

## 2022-04-14 DIAGNOSIS — E119 Type 2 diabetes mellitus without complications: Secondary | ICD-10-CM | POA: Insufficient documentation

## 2022-04-14 LAB — CMP (CANCER CENTER ONLY)
ALT: 21 U/L (ref 0–44)
AST: 23 U/L (ref 15–41)
Albumin: 3.8 g/dL (ref 3.5–5.0)
Alkaline Phosphatase: 45 U/L (ref 38–126)
Anion gap: 8 (ref 5–15)
BUN: 22 mg/dL (ref 8–23)
CO2: 19 mmol/L — ABNORMAL LOW (ref 22–32)
Calcium: 9.5 mg/dL (ref 8.9–10.3)
Chloride: 107 mmol/L (ref 98–111)
Creatinine: 1.18 mg/dL (ref 0.61–1.24)
GFR, Estimated: >60 mL/min (ref >60)
Glucose, Bld: 130 mg/dL — ABNORMAL HIGH (ref 70–99)
Potassium: 3.8 mmol/L (ref 3.5–5.1)
Sodium: 134 mmol/L — ABNORMAL LOW (ref 135–145)
Total Bilirubin: 1.2 mg/dL (ref 0.3–1.2)
Total Protein: 6.7 g/dL (ref 6.5–8.1)

## 2022-04-14 LAB — CBC WITH DIFFERENTIAL (CANCER CENTER ONLY)
Abs Immature Granulocytes: 0.02 10*3/uL (ref 0.00–0.07)
Basophils Absolute: 0 10*3/uL (ref 0.0–0.1)
Basophils Relative: 1 %
Eosinophils Absolute: 0.1 10*3/uL (ref 0.0–0.5)
Eosinophils Relative: 2 %
HCT: 45.8 % (ref 39.0–52.0)
Hemoglobin: 16.1 g/dL (ref 13.0–17.0)
Immature Granulocytes: 0 %
Lymphocytes Relative: 31 %
Lymphs Abs: 2 10*3/uL (ref 0.7–4.0)
MCH: 26.5 pg (ref 26.0–34.0)
MCHC: 35.2 g/dL (ref 30.0–36.0)
MCV: 75.3 fL — ABNORMAL LOW (ref 80.0–100.0)
Monocytes Absolute: 0.6 10*3/uL (ref 0.1–1.0)
Monocytes Relative: 9 %
Neutro Abs: 3.8 10*3/uL (ref 1.7–7.7)
Neutrophils Relative %: 57 %
Platelet Count: 163 10*3/uL (ref 150–400)
RBC: 6.08 MIL/uL — ABNORMAL HIGH (ref 4.22–5.81)
RDW: 18.7 % — ABNORMAL HIGH (ref 11.5–15.5)
WBC Count: 6.6 10*3/uL (ref 4.0–10.5)
nRBC: 0 % (ref 0.0–0.2)

## 2022-04-14 NOTE — Progress Notes (Signed)
[place] Telephone:([phone]) [phone]   Fax:(336) [name] [address]  DIAGNOSIS: Persistent polycythemia highly suspicious for polycythemia vera with negative JAK-2 mutation. Negative MPL515 mutation, Negative BCR/ABL and negative JAK-2 exon 12.  PRIOR THERAPY: None  CURRENT THERAPY: Phlebotomy on as-needed basis.  INTERVAL HISTORY: [name]. 61 y.o. male returns to the clinic today for follow-up visit.  The patient is feeling fine today with no concerning complaints.  He started treatment with [facility] for his diabetes and he lost few pounds already.  He denied having any current chest pain, shortness of breath except with exertion with no cough or hemoptysis.  He denied having any fever or chills.  He has no nausea, vomiting, diarrhea or constipation.  He has no headache or visual changes.  He is here today for evaluation and repeat blood work.  MEDICAL HISTORY: Past Medical History:  Diagnosis Date   Arthritis    Diabetes mellitus without complication ([place])    TYPE 2   Diverticulosis    Dyslipidemia    GERD (gastroesophageal reflux disease)    Herpes labialis    Hypertension    Longstanding persistent atrial fibrillation (HCC)    Metabolic syndrome    Obesity    Proteinuria    Psoriasis    Seborrheic dermatitis    Sleep apnea    very compliant with CPAP, PT NEEDS TO BRING OWN MACHINE   TIA (transient ischemic attack) 2009    ALLERGIES:  is allergic to morphine and related.  MEDICATIONS:  Current Outpatient Medications  Medication Sig Dispense Refill   acetaminophen (TYLENOL) 500 MG tablet Take 500 mg by mouth every 6 (six) hours as needed.     carvedilol (COREG) 25 MG tablet Take 1 tablet (25 mg total) by mouth 2 (two) times daily. 180 tablet 3   clobetasol cream (TEMOVATE) 0.05 % APPLY TOPICALLY TO AFFECTED AREA TWICE DAILY. 30 g 0   doxycycline (VIBRA-TABS) 100  MG tablet Take 1 tablet (100 mg total) by mouth 2 (two) times daily. 180 tablet 3   empagliflozin (JARDIANCE) 10 MG TABS tablet Take 1 tablet (10 mg total) by mouth daily. 90 tablet 1   glucose blood test strip 1 each by Other route as needed. Use as instructed Freestyle test stripts 100 each 3   glucose monitoring kit (FREESTYLE) monitoring kit 1 each by Does not apply route as needed. 1 each 0   Lancets (FREESTYLE) lancets 1 each by Other route as needed. Use as instructed 100 each 3   methocarbamol (ROBAXIN) 500 MG tablet Take 1 tablet (500 mg total) by mouth every 6 (six) hours as needed for muscle spasms. 40 tablet 0   Multiple Vitamin (MULTIVITAMIN WITH MINERALS) TABS tablet Take 1 tablet by mouth daily.     Oxymetazoline HCl (NASAL SPRAY) 0.05 % SOLN Place into the nose.     pantoprazole (PROTONIX) 40 MG tablet Take 1 tablet (40 mg total) by mouth 2 (two) times daily. 180 tablet 1   rivaroxaban (XARELTO) 20 MG TABS tablet Take 1 tablet (20 mg total) by mouth daily with supper. 90 tablet 1   rosuvastatin (CRESTOR) 10 MG tablet Take 1 tablet (10 mg total) by mouth daily. 90 tablet 1   sacubitril-valsartan (ENTRESTO) 24-26 MG Take 1 tablet by mouth 2 (two) times daily. 180 tablet 3   TALTZ 80 MG/ML SOAJ Inject 1 ml (82m) under  the skin every 4 weeks. 1 mL 10   tirzepatide (MOUNJARO) 2.5 MG/0.5ML Pen Inject 2.5 mg into the skin once a week. 2 mL 0   Vitamin D, Cholecalciferol, 1000 units CAPS Take 1,000 Units by mouth daily.      No current facility-administered medications for this visit.    SURGICAL HISTORY:  Past Surgical History:  Procedure Laterality Date   CARDIOVERSION N/A 12/02/2016   Procedure: CARDIOVERSION;  Surgeon: Pixie Casino, MD;  Location: [facility];  Service: Cardiovascular;  Laterality: N/A;   COLONOSCOPY     EXCISIONAL TOTAL KNEE ARTHROPLASTY WITH ANTIBIOTIC SPACERS Right 03/28/2019   Procedure: EXCISIONAL TOTAL KNEE ARTHROPLASTY WITH ANTIBIOTIC SPACERS;  Surgeon:  [name], MD;  [facility];  Service: Orthopedics;  Laterality: Right;  90 mins   I & D KNEE WITH POLY EXCHANGE Right 09/10/2018   Procedure: Right Knee Arthroplasty IRRIGATION AND DEBRIDEMENT KNEE WITH POLY EXCHANGE;  Surgeon: [name], MD;  [facility];  Service: Orthopedics;  Laterality: Right;   I & D KNEE WITH POLY EXCHANGE Right 06/25/2020   Procedure: Open excisional and non excisional debridement right knee, possible aspiration versus open arthrotomy poly exchange;  Surgeon: [name], MD;  [facility];  Service: Orthopedics;  Laterality: Right;   IRRIGATION AND DEBRIDEMENT KNEE Right 07/09/2020   Procedure: IRRIGATION AND DEBRIDEMENT KNEE;  Surgeon: [name], MD;  [facility];  Service: Orthopedics;  Laterality: Right;   JOINT REPLACEMENT  11/03/10   LT HIP   PFO occluder cardiac valve  2006   Dr. Einar Gip, (hole in heart)   REIMPLANTATION OF TOTAL KNEE Right 06/15/2019   Procedure: Resection of antibiotic spacer and  irrigation and debridment and placement of new antibiotic spacer and components;  Surgeon: [name], MD;  [facility];  Service: Orthopedics;  Laterality: Right;  90 mins   REIMPLANTATION OF TOTAL KNEE Right 08/22/2019   Procedure: REIMPLANTATION OF TOTAL KNEE;  Surgeon: [name], MD;  [facility];  Service: Orthopedics;  Laterality: Right;  120 mins   SKIN BIOPSY Left 12/22/2019   epidermal inclusion cyst   TONSILLECTOMY AND ADENOIDECTOMY     TOTAL HIP ARTHROPLASTY Left    TOTAL KNEE ARTHROPLASTY Right 05/31/2018   Procedure: RIGHT TOTAL KNEE ARTHROPLASTY;  Surgeon: [name], MD;  [facility];  Service: Orthopedics;  Laterality: Right;  70 mins   TOTAL KNEE ARTHROPLASTY Left 06/28/2018   Procedure: LEFT TOTAL KNEE ARTHROPLASTY;  Surgeon: [name], MD;  [facility];  Service: Orthopedics;  Laterality: Left;  70 mins   WISDOM TOOTH EXTRACTION      REVIEW OF SYSTEMS:  A comprehensive review of  systems was negative except for: Constitutional: positive for fatigue Musculoskeletal: positive for arthralgias   PHYSICAL EXAMINATION: General appearance: alert, cooperative, fatigued, and no distress Head: Normocephalic, without obvious abnormality, atraumatic Neck: no adenopathy, no JVD, supple, symmetrical, trachea midline, and thyroid not enlarged, symmetric, no tenderness/mass/nodules Lymph nodes: Cervical, supraclavicular, and axillary nodes normal. Resp: clear to auscultation bilaterally Back: symmetric, no curvature. ROM normal. No CVA tenderness. Cardio: regular rate and rhythm, S1, S2 normal, no murmur, click, rub or gallop GI: soft, non-tender; bowel sounds normal; no masses,  no organomegaly Extremities: extremities normal, atraumatic, no cyanosis or edema  ECOG PERFORMANCE STATUS: 1 - Symptomatic but completely ambulatory  Blood pressure 118/85, pulse (!) 106, temperature 98.3 F (36.8 C), temperature source Oral, resp. rate 16, height 5' 10"  (1.778 m), weight 277 lb 11.2  oz (126 kg), SpO2 97 %.  LABORATORY DATA: Lab Results  Component Value Date   WBC 6.6 04/14/2022   HGB 16.1 04/14/2022   HCT 45.8 04/14/2022   MCV 75.3 (L) 04/14/2022   PLT 163 04/14/2022      Chemistry      Component Value Date/Time   NA 136 02/16/2022 1010   NA 141 05/27/2020 0916   NA 138 05/22/2015 1303   K 3.9 02/16/2022 1010   K 4.1 05/22/2015 1303   CL 108 02/16/2022 1010   CO2 21 (L) 02/16/2022 1010   CO2 23 05/22/2015 1303   BUN 16 02/16/2022 1010   BUN 22 05/27/2020 0916   BUN 17.4 05/22/2015 1303   CREATININE 1.09 02/16/2022 1010   CREATININE 1.18 09/03/2020 1620   CREATININE 1.1 05/22/2015 1303      Component Value Date/Time   CALCIUM 9.3 02/16/2022 1010   CALCIUM 9.4 05/22/2015 1303   ALKPHOS 48 02/16/2022 1010   ALKPHOS 73 05/22/2015 1303   AST 22 02/16/2022 1010   AST 24 05/22/2015 1303   ALT 22 02/16/2022 1010   ALT 30 05/22/2015 1303   BILITOT 1.0 02/16/2022 1010    BILITOT 0.77 05/22/2015 1303       RADIOGRAPHIC STUDIES: No results found.  ASSESSMENT AND PLAN: This is a very pleasant 61 years old white male with polycythemia highly suspicious for polycythemia vera but with negative JAK mutation.   He has been receiving phlebotomy on as-needed basis to keep his hematocrit between 45-50%. The patient is currently on observation and he is doing fine today. Repeat CBC showed hematocrit of 45.8%. I recommended for him to continue on observation with repeat blood work and phlebotomy if needed in 2 months. He was advised to call immediately if he has any other concerning symptoms in the interval. The patient voices understanding of current disease status and treatment options and is in agreement with the current care plan. All questions were answered. The patient knows to call the clinic with any problems, questions or concerns. We can certainly see the patient much sooner if necessary.  The total time spent in the appointment was 20 minutes.  Disclaimer: This note was dictated with voice recognition software. Similar sounding words can inadvertently be transcribed and may not be corrected upon review.
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
## 2022-04-15 ENCOUNTER — Other Ambulatory Visit: Payer: Self-pay

## 2022-04-15 ENCOUNTER — Encounter: Payer: Self-pay | Admitting: Physical Therapy

## 2022-04-15 ENCOUNTER — Ambulatory Visit: Payer: 59 | Admitting: Physical Therapy

## 2022-04-15 DIAGNOSIS — M6281 Muscle weakness (generalized): Secondary | ICD-10-CM | POA: Diagnosis not present

## 2022-04-15 DIAGNOSIS — M5459 Other low back pain: Secondary | ICD-10-CM | POA: Diagnosis not present

## 2022-04-15 DIAGNOSIS — M542 Cervicalgia: Secondary | ICD-10-CM

## 2022-04-15 DIAGNOSIS — R293 Abnormal posture: Secondary | ICD-10-CM
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
## 2022-04-17 ENCOUNTER — Encounter: Payer: Self-pay | Admitting: Physical Therapy

## 2022-04-17 ENCOUNTER — Ambulatory Visit: Payer: 59 | Admitting: Physical Therapy

## 2022-04-17 ENCOUNTER — Other Ambulatory Visit: Payer: Self-pay

## 2022-04-17 DIAGNOSIS — M6281 Muscle weakness (generalized): Secondary | ICD-10-CM

## 2022-04-17 DIAGNOSIS — M5459 Other low back pain: Secondary | ICD-10-CM

## 2022-04-17 DIAGNOSIS — M542 Cervicalgia: Secondary | ICD-10-CM

## 2022-04-17 DIAGNOSIS — R293 Abnormal posture: Secondary | ICD-10-CM

## 2022-04-22 ENCOUNTER — Encounter: Payer: Self-pay | Admitting: Physical Therapy

## 2022-04-22 ENCOUNTER — Ambulatory Visit: Payer: 59 | Admitting: Physical Therapy

## 2022-04-22 ENCOUNTER — Other Ambulatory Visit: Payer: Self-pay

## 2022-04-22 DIAGNOSIS — M5459 Other low back pain: Secondary | ICD-10-CM

## 2022-04-22 DIAGNOSIS — R293 Abnormal posture: Secondary | ICD-10-CM

## 2022-04-22 DIAGNOSIS — M6281 Muscle weakness (generalized): Secondary | ICD-10-CM | POA: Diagnosis not present

## 2022-04-22 DIAGNOSIS — M542 Cervicalgia: Secondary | ICD-10-CM
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
## 2022-04-24 ENCOUNTER — Other Ambulatory Visit: Payer: Self-pay | Admitting: Family Medicine

## 2022-04-24 ENCOUNTER — Other Ambulatory Visit (HOSPITAL_COMMUNITY): Payer: Self-pay

## 2022-04-24 ENCOUNTER — Ambulatory Visit: Payer: 59 | Admitting: Physical Therapy

## 2022-04-24 ENCOUNTER — Encounter: Payer: Self-pay | Admitting: Physical Therapy

## 2022-04-24 ENCOUNTER — Other Ambulatory Visit: Payer: Self-pay

## 2022-04-24 DIAGNOSIS — R293 Abnormal posture: Secondary | ICD-10-CM | POA: Diagnosis not present

## 2022-04-24 DIAGNOSIS — M6281 Muscle weakness (generalized): Secondary | ICD-10-CM | POA: Diagnosis not present

## 2022-04-24 DIAGNOSIS — M542 Cervicalgia: Secondary | ICD-10-CM

## 2022-04-24 DIAGNOSIS — M5459 Other low back pain: Secondary | ICD-10-CM

## 2022-04-24 DIAGNOSIS — E1121 Type 2 diabetes mellitus with diabetic nephropathy: Secondary | ICD-10-CM

## 2022-04-26 DIAGNOSIS — G4733 Obstructive sleep apnea (adult) (pediatric): Secondary | ICD-10-CM | POA: Diagnosis not present

## 2022-04-27 ENCOUNTER — Other Ambulatory Visit: Payer: Self-pay

## 2022-04-27 ENCOUNTER — Other Ambulatory Visit (HOSPITAL_COMMUNITY): Payer: Self-pay

## 2022-04-27 MED ORDER — MOUNJARO 2.5 MG/0.5ML ~~LOC~~ SOAJ
2.5000 mg | SUBCUTANEOUS | 0 refills | Status: DC
  Filled 2022-04-27: qty 2, 28d supply, fill #0

## 2022-04-27 NOTE — Telephone Encounter (Signed)
Error Noberto Retort

## 2022-04-27 NOTE — Telephone Encounter (Signed)
Pt advised this is his last week on the 2.5 mg and we received a  refill for 2.5 for this week and will need to know if he is increasing to the next dose. Tolerated well per pt [facility]

## 2022-04-27 NOTE — Telephone Encounter (Signed)
Lvm for pt to call back to advise of strength if he is still taking this med [facility]

## 2022-04-29 ENCOUNTER — Ambulatory Visit: Payer: 59 | Admitting: Physical Therapy

## 2022-04-29 ENCOUNTER — Other Ambulatory Visit: Payer: Self-pay

## 2022-04-29 ENCOUNTER — Encounter: Payer: Self-pay | Admitting: Physical Therapy

## 2022-04-29 DIAGNOSIS — R293 Abnormal posture: Secondary | ICD-10-CM

## 2022-04-29 DIAGNOSIS — M5459 Other low back pain: Secondary | ICD-10-CM | POA: Diagnosis not present

## 2022-04-29 DIAGNOSIS — M6281 Muscle weakness (generalized): Secondary | ICD-10-CM

## 2022-04-29 DIAGNOSIS — M542 Cervicalgia: Secondary | ICD-10-CM | POA: Diagnosis not present
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
## 2022-05-01 ENCOUNTER — Other Ambulatory Visit: Payer: Self-pay

## 2022-05-01 ENCOUNTER — Encounter: Payer: Self-pay | Admitting: Physical Therapy

## 2022-05-01 ENCOUNTER — Ambulatory Visit: Payer: 59 | Attending: Family Medicine | Admitting: Physical Therapy

## 2022-05-01 DIAGNOSIS — M6281 Muscle weakness (generalized): Secondary | ICD-10-CM | POA: Diagnosis not present

## 2022-05-01 DIAGNOSIS — M5459 Other low back pain: Secondary | ICD-10-CM | POA: Insufficient documentation

## 2022-05-01 DIAGNOSIS — M542 Cervicalgia: Secondary | ICD-10-CM | POA: Insufficient documentation

## 2022-05-01 DIAGNOSIS — R293 Abnormal posture: Secondary | ICD-10-CM | POA: Insufficient documentation

## 2022-05-05 ENCOUNTER — Other Ambulatory Visit (HOSPITAL_COMMUNITY): Payer: Self-pay

## 2022-05-22 ENCOUNTER — Other Ambulatory Visit (HOSPITAL_COMMUNITY): Payer: Self-pay

## 2022-05-22 ENCOUNTER — Other Ambulatory Visit: Payer: Self-pay | Admitting: Family Medicine

## 2022-05-22 DIAGNOSIS — E1121 Type 2 diabetes mellitus with diabetic nephropathy: Secondary | ICD-10-CM

## 2022-05-22 MED ORDER — MOUNJARO 2.5 MG/0.5ML ~~LOC~~ SOAJ
2.5000 mg | SUBCUTANEOUS | 0 refills | Status: DC
  Filled 2022-05-22: qty 2, 28d supply, fill #0

## 2022-05-22 NOTE — Telephone Encounter (Signed)
[facility] is requesting to fill pt mounjaro. Please advise [facility]

## 2022-05-25 ENCOUNTER — Other Ambulatory Visit (HOSPITAL_COMMUNITY): Payer: Self-pay

## 2022-05-27 ENCOUNTER — Other Ambulatory Visit (HOSPITAL_COMMUNITY): Payer: Self-pay

## 2022-05-27 DIAGNOSIS — G4733 Obstructive sleep apnea (adult) (pediatric): Secondary | ICD-10-CM | POA: Diagnosis not present

## 2022-05-28 ENCOUNTER — Encounter: Payer: Self-pay | Admitting: Internal Medicine

## 2022-05-28 ENCOUNTER — Other Ambulatory Visit (HOSPITAL_COMMUNITY): Payer: Self-pay

## 2022-05-29 ENCOUNTER — Encounter: Payer: Self-pay | Admitting: Internal Medicine

## 2022-05-29 ENCOUNTER — Other Ambulatory Visit (HOSPITAL_COMMUNITY): Payer: Self-pay
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
## 2022-06-03 ENCOUNTER — Encounter: Payer: Self-pay | Admitting: Physical Therapy

## 2022-06-03 ENCOUNTER — Other Ambulatory Visit: Payer: Self-pay

## 2022-06-03 ENCOUNTER — Ambulatory Visit: Payer: 59 | Attending: Family Medicine | Admitting: Physical Therapy

## 2022-06-03 DIAGNOSIS — M542 Cervicalgia: Secondary | ICD-10-CM | POA: Insufficient documentation

## 2022-06-03 DIAGNOSIS — M6281 Muscle weakness (generalized): Secondary | ICD-10-CM | POA: Insufficient documentation

## 2022-06-03 DIAGNOSIS — R293 Abnormal posture: Secondary | ICD-10-CM | POA: Diagnosis not present

## 2022-06-03 DIAGNOSIS — M5459 Other low back pain: Secondary | ICD-10-CM | POA: Diagnosis not present

## 2022-06-05 ENCOUNTER — Other Ambulatory Visit: Payer: Self-pay

## 2022-06-05 ENCOUNTER — Ambulatory Visit: Payer: 59 | Admitting: Physical Therapy

## 2022-06-05 ENCOUNTER — Encounter: Payer: Self-pay | Admitting: Physical Therapy

## 2022-06-05 DIAGNOSIS — R293 Abnormal posture: Secondary | ICD-10-CM | POA: Diagnosis not present

## 2022-06-05 DIAGNOSIS — M5459 Other low back pain: Secondary | ICD-10-CM

## 2022-06-05 DIAGNOSIS — M6281 Muscle weakness (generalized): Secondary | ICD-10-CM

## 2022-06-05 DIAGNOSIS — M542 Cervicalgia: Secondary | ICD-10-CM
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
## 2022-06-08 ENCOUNTER — Other Ambulatory Visit: Payer: Self-pay | Admitting: Cardiovascular Disease

## 2022-06-08 ENCOUNTER — Other Ambulatory Visit: Payer: Self-pay | Admitting: Family Medicine

## 2022-06-08 ENCOUNTER — Other Ambulatory Visit (HOSPITAL_COMMUNITY): Payer: Self-pay

## 2022-06-08 DIAGNOSIS — E1121 Type 2 diabetes mellitus with diabetic nephropathy: Secondary | ICD-10-CM

## 2022-06-08 MED ORDER — MOUNJARO 2.5 MG/0.5ML ~~LOC~~ SOAJ
2.5000 mg | SUBCUTANEOUS | 0 refills | Status: DC
  Filled 2022-06-08: qty 2, 28d supply, fill #0

## 2022-06-08 MED ORDER — CARVEDILOL 25 MG PO TABS
25.0000 mg | ORAL_TABLET | Freq: Two times a day (BID) | ORAL | 0 refills | Status: DC
  Filled 2022-06-08: qty 60, 30d supply, fill #0

## 2022-06-08 NOTE — Telephone Encounter (Signed)
[facility] is requesting to fill pt mounjaro. Please advise [facility]

## 2022-06-09 ENCOUNTER — Encounter: Payer: Self-pay | Admitting: Internal Medicine

## 2022-06-10 ENCOUNTER — Telehealth: Payer: Self-pay | Admitting: Family Medicine

## 2022-06-10 NOTE — Telephone Encounter (Signed)
Pt needs DOT physical, his has expired.  You have no openings until January.  Do you want me to work him in or have him go somewhere else?

## 2022-06-10 NOTE — Telephone Encounter (Signed)
I called pt. 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 I gave him all the info Audelia Acton had relayed to [place].

## 2022-06-11 ENCOUNTER — Encounter: Payer: Self-pay | Admitting: Family Medicine

## 2022-06-11 ENCOUNTER — Other Ambulatory Visit (HOSPITAL_COMMUNITY): Payer: Self-pay

## 2022-06-11 ENCOUNTER — Ambulatory Visit: Payer: 59 | Admitting: Family Medicine

## 2022-06-11 VITALS — BP 110/80 | HR 66 | Temp 97.2°F | Wt 278.4 lb

## 2022-06-11 DIAGNOSIS — M542 Cervicalgia: Secondary | ICD-10-CM | POA: Diagnosis not present

## 2022-06-11 DIAGNOSIS — E1121 Type 2 diabetes mellitus with diabetic nephropathy: Secondary | ICD-10-CM

## 2022-06-11 MED ORDER — TIRZEPATIDE 5 MG/0.5ML ~~LOC~~ SOAJ
5.0000 mg | SUBCUTANEOUS | 1 refills | Status: DC
  Filled 2022-06-11 – 2022-06-15 (×2): qty 6, 84d supply, fill #0
  Filled 2022-09-04: qty 6, 84d supply, fill #1
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
## 2022-06-15 ENCOUNTER — Other Ambulatory Visit (HOSPITAL_COMMUNITY): Payer: Self-pay

## 2022-06-16 ENCOUNTER — Encounter: Payer: Self-pay | Admitting: Orthopedic Surgery

## 2022-06-16 ENCOUNTER — Ambulatory Visit (INDEPENDENT_AMBULATORY_CARE_PROVIDER_SITE_OTHER): Payer: 59

## 2022-06-16 ENCOUNTER — Ambulatory Visit: Payer: 59 | Admitting: Orthopedic Surgery

## 2022-06-16 VITALS — BP 124/88 | HR 66 | Ht 70.0 in | Wt 278.5 lb

## 2022-06-16 DIAGNOSIS — M542 Cervicalgia: Secondary | ICD-10-CM
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
## 2022-06-20 NOTE — Telephone Encounter (Signed)
done

## 2022-06-21 ENCOUNTER — Ambulatory Visit (HOSPITAL_BASED_OUTPATIENT_CLINIC_OR_DEPARTMENT_OTHER)
Admission: RE | Admit: 2022-06-21 | Discharge: 2022-06-21 | Disposition: A | Discharge status: Home / Self Care | Payer: 59 | Source: Ambulatory Visit | Attending: Orthopedic Surgery | Admitting: Orthopedic Surgery

## 2022-06-21 DIAGNOSIS — M542 Cervicalgia: Secondary | ICD-10-CM | POA: Insufficient documentation

## 2022-06-21 DIAGNOSIS — Z041 Encounter for examination and observation following transport accident: Secondary | ICD-10-CM | POA: Diagnosis not present

## 2022-06-21 DIAGNOSIS — M47812 Spondylosis without myelopathy or radiculopathy, cervical region: Secondary | ICD-10-CM | POA: Diagnosis not present

## 2022-06-22 ENCOUNTER — Encounter: Payer: Self-pay | Admitting: Internal Medicine

## 2022-06-22 ENCOUNTER — Other Ambulatory Visit (HOSPITAL_COMMUNITY): Payer: Self-pay

## 2022-06-23 ENCOUNTER — Other Ambulatory Visit (HOSPITAL_COMMUNITY): Payer: Self-pay

## 2022-06-23 ENCOUNTER — Ambulatory Visit: Payer: 59 | Attending: Cardiovascular Disease | Admitting: Cardiovascular Disease

## 2022-06-23 ENCOUNTER — Encounter: Payer: Self-pay | Admitting: Cardiovascular Disease

## 2022-06-23 VITALS — BP 128/84 | HR 83 | Ht 69.8 in | Wt 275.0 lb

## 2022-06-23 DIAGNOSIS — I4811 Longstanding persistent atrial fibrillation: Secondary | ICD-10-CM | POA: Diagnosis not present

## 2022-06-23 DIAGNOSIS — I5042 Chronic combined systolic (congestive) and diastolic (congestive) heart failure: Secondary | ICD-10-CM | POA: Diagnosis not present

## 2022-06-23 DIAGNOSIS — G4733 Obstructive sleep apnea (adult) (pediatric): Secondary | ICD-10-CM | POA: Diagnosis not present

## 2022-06-23 NOTE — Patient Instructions (Signed)
Medication Instructions:  Your physician recommends that you continue on your current medications as directed. Please refer to the Current Medication list given to you today.  *If you need a refill on your cardiac medications before your next appointment, please call your pharmacy*   Lab Work: NONE If you have labs (blood work) drawn today and your tests are completely normal, you will receive your results only by: [facility] (if you have MyChart) OR A paper copy in the mail If you have any lab test that is abnormal or we need to change your treatment, we will call you to review the results.   Testing/Procedures: ECHO Your physician has requested that you have an echocardiogram. Echocardiography is a painless test that uses sound waves to create images of your heart. It provides your doctor with information about the size and shape of your heart and how well your heart's chambers and valves are working. This procedure takes approximately one hour. There are no restrictions for this procedure. Please do NOT wear cologne, perfume, aftershave, or lotions (deodorant is allowed). Please arrive 15 minutes prior to your appointment time.  Follow-Up: At [facility], you and your health needs are our priority.  As part of our continuing mission to provide you with exceptional heart care, we have created designated Provider Care Teams.  These Care Teams include your primary Cardiologist (physician) and Advanced Practice Providers (APPs -  Physician Assistants and Nurse Practitioners) who all work together to provide you with the care you need, when you need it.  Your next appointment:   3 month(s)  The format for your next appointment:   In Person  Provider:   Mertie Moores, MD       Important Information About Sugar

## 2022-06-23 NOTE — Progress Notes (Signed)
Cardiology Office Note:    Date:  06/23/2022   ID:  [name]., DOB 12-05-1960, MRN [id number]  PCP:  Denita Lung, MD  Cardiologist:  Acie Fredrickson   / [place]  Electrophysiologist:  None   Referring MD: Denita Lung, MD   1.   PFO closure 2.  Atrial fib 3.   Chronic systolic CHF   Chief Complaint  Patient presents with   Congestive Heart Failure        Atrial Fibrillation          Jan.  6, 2020    [name]. is a 61 y.o. male with a hx of persistent atrial fibrillation, dyslipidemia, obstructive sleep apnea He has been seen in the past by Dr. [name].  He is seen currently by Dr. Thompson Grayer and was furred to me for general cardiology issues.  Hx of HTN, PFO - s/p occlusion device.   Hx of CHF  Hx of OSA    Seen with his wife , Dannielle Delgado  Has seen Dr. Einar Gip in the past  Has a PFO occlusion device following a TIA   Has had bilateral knee replacements ,  Both in Oct. 2019.   Dr. Rayann Heman increased his Coreg to 25  BID ,  Has some fatigue related to this increase  Tolerated the lower dose better .   Is on crutches today ,  Typically gets 7000-8000 steps a day   No CP, no dyspnea,  No sweats, no  PND.  No fever, rash, Sleeps well with CPAP.    Works as a Clinical biochemist   Oncologist and [place] )  His son played [place] with my son   Feb. 24, 2020:  [name] is seen today for follow-up of his atrial fibrillation, dyslipidemia struct of sleep apnea.  Has had bilateral knee replacement  Has has an infected right  knee joint ( MSSA ) .   Had a right knee poly exchange  Had 6 weeks of Ancef.     Is on Rifampin for 6 months  Is on Lovenox for now .   Does not want to do coumadin  Doesn't think he wants to go through rehab for his last knee surgery  Has been started on Jardiance. Has lost 50 lbs since Oct.   September 27, 2019: [name] is seen back today for his follow-up of his atrial fibrillation, dyslipidemia, and his chronic systolic congestive  heart failure.  Wt is 275 lbs .   Has continued to have issues with his infected right knee prosthesis  Has had 5 operations on his right knee.   Had his 3rd R TKA . He is feeling better.   Is back on xarelto   Still doing general contracting     April 01, 2020: [name] is seen today for follow-up of his atrial fibrillation, dyslipidemia, chronic systolic congestive heart failure. Weight is 278 pounds No complaints.  No CP, no dyspnea. Walks 7000 steps a day .  Prosthetic R knee issues - slowly improving.    Still has fluid around his knee.  Still on Abx.  Labs reveiewd  - LDL is great - 30 Trigs are 228. Is on a keto diet . Has lost 15 lbs recently .      Oct. 2022:261 Seen with his wife, Dannielle Delgado.   [name] is seen today for follow up of his atrial fib, dyslipidemia , chronic systolic CHF Has OSA, wears his CPAP.  Has thrombocytosis  Is seeing oncology    sees Dr. Annamaria Boots ( pulmonary )   S/p PFO closure  Wt = 261 lbs - has lost 30-llbs over the year  Has had multiple R knee surgeries - all from a persistent infection that has not cleared  Is back walking some ,   does normal activity .  Advised more cycling compared to walking  No CP , no syncope .  Occasional dizziness ( associated with blurred vision ) improves with some glucose,   he thinks its due to hypoglycemia   Has moderate LV dysfunction  - EF 35-40%.   June 23, 2022: Seen today for follow-up of his atrial fibrillation, dyslipidemia, chronic systolic congestive heart failure. He has obstructive sleep apnea.  He wears a CPAP.  Weight today is 275 pounds which is up 14 pounds from last year.  Is on Montjaro for the past 2 months ,  has lost 12 lbs since starting  Has gastroparesis after the last shot   Had an infected knee replacement  Stil on suppressant dose Doxycycline 100 mg BID    Has had Afib for the past 10 years  Has had at least 5 times .   We discussed Afib ablation  He is not sure if he wants to have  the procedure  Has had a FPO occlusion device 15 years ago for a TIA ,  This may prevent a successful Afib ablation     Past Medical History:  Diagnosis Date   Arthritis    Diabetes mellitus without complication ([place])    TYPE 2   Diverticulosis    Dyslipidemia    GERD (gastroesophageal reflux disease)    Herpes labialis    Hypertension    Longstanding persistent atrial fibrillation (HCC)    Metabolic syndrome    Obesity    Proteinuria    Psoriasis    Seborrheic dermatitis    Sleep apnea    very compliant with CPAP, PT NEEDS TO BRING OWN MACHINE   TIA (transient ischemic attack) 2009    Past Surgical History:  Procedure Laterality Date   CARDIOVERSION N/A 12/02/2016   Procedure: CARDIOVERSION;  Surgeon: Pixie Casino, MD;  Location: [facility];  Service: Cardiovascular;  Laterality: N/A;   COLONOSCOPY     EXCISIONAL TOTAL KNEE ARTHROPLASTY WITH ANTIBIOTIC SPACERS Right 03/28/2019   Procedure: EXCISIONAL TOTAL KNEE ARTHROPLASTY WITH ANTIBIOTIC SPACERS;  Surgeon: [name], MD;  Location: [facility];  Service: Orthopedics;  Laterality: Right;  90 mins   I & D KNEE WITH POLY EXCHANGE Right 09/10/2018   Procedure: Right Knee Arthroplasty IRRIGATION AND DEBRIDEMENT KNEE WITH POLY EXCHANGE;  Surgeon: [name], MD;  Location: [facility];  Service: Orthopedics;  Laterality: Right;   I & D KNEE WITH POLY EXCHANGE Right 06/25/2020   Procedure: Open excisional and non excisional debridement right knee, possible aspiration versus open arthrotomy poly exchange;  Surgeon: [name], MD;  [facility];  Service: Orthopedics;  Laterality: Right;   IRRIGATION AND DEBRIDEMENT KNEE Right 07/09/2020   Procedure: IRRIGATION AND DEBRIDEMENT KNEE;  Surgeon: [name], MD;  Location[facility];  Service: Orthopedics;  Laterality: Right;   JOINT REPLACEMENT  11/03/10   LT HIP   PFO occluder cardiac valve  2006   Dr. Einar Gip, (hole in heart)   REIMPLANTATION OF TOTAL KNEE Right 06/15/2019    Procedure: Resection of antibiotic spacer and  irrigation and debridment and placement of new antibiotic spacer and components;  Surgeon: [name],  MD;  Location[facility];  Service: Orthopedics;  Laterality: Right;  90 mins   REIMPLANTATION OF TOTAL KNEE Right 08/22/2019   Procedure: REIMPLANTATION OF TOTAL KNEE;  Surgeon: [name], MD;  [facility];  Service: Orthopedics;  Laterality: Right;  120 mins   SKIN BIOPSY Left 12/22/2019   epidermal inclusion cyst   TONSILLECTOMY AND ADENOIDECTOMY     TOTAL HIP ARTHROPLASTY Left    TOTAL KNEE ARTHROPLASTY Right 05/31/2018   Procedure: RIGHT TOTAL KNEE ARTHROPLASTY;  Surgeon: [name], MD;  [facility];  Service: Orthopedics;  Laterality: Right;  70 mins   TOTAL KNEE ARTHROPLASTY Left 06/28/2018   Procedure: LEFT TOTAL KNEE ARTHROPLASTY;  Surgeon: [name], MD;  Location[facility];  Service: Orthopedics;  Laterality: Left;  70 mins   WISDOM TOOTH EXTRACTION      Current Medications: Current Meds  Medication Sig   acetaminophen (TYLENOL) 500 MG tablet Take 500 mg by mouth every 6 (six) hours as needed.   carvedilol (COREG) 25 MG tablet Take 1 tablet (25 mg total) by mouth 2 (two) times daily.   clobetasol cream (TEMOVATE) 0.05 % APPLY TOPICALLY TO AFFECTED AREA TWICE DAILY.   doxycycline (VIBRA-TABS) 100 MG tablet Take 1 tablet (100 mg total) by mouth 2 (two) times daily.   empagliflozin (JARDIANCE) 10 MG TABS tablet Take 1 tablet (10 mg total) by mouth daily.   glucose blood test strip 1 each by Other route as needed. Use as instructed Freestyle test stripts   glucose monitoring kit (FREESTYLE) monitoring kit 1 each by Does not apply route as needed.   Lancets (FREESTYLE) lancets 1 each by Other route as needed. Use as instructed   methocarbamol (ROBAXIN) 500 MG tablet Take 1 tablet (500 mg total) by mouth every 6 (six) hours as needed for muscle spasms.   Multiple Vitamin (MULTIVITAMIN WITH MINERALS) TABS tablet Take 1  tablet by mouth daily.   Oxymetazoline HCl (NASAL SPRAY) 0.05 % SOLN Place into the nose.   pantoprazole (PROTONIX) 40 MG tablet Take 1 tablet (40 mg total) by mouth 2 (two) times daily.   rivaroxaban (XARELTO) 20 MG TABS tablet Take 1 tablet (20 mg total) by mouth daily with supper.   rosuvastatin (CRESTOR) 10 MG tablet Take 1 tablet (10 mg total) by mouth daily.   sacubitril-valsartan (ENTRESTO) 24-26 MG Take 1 tablet by mouth 2 (two) times daily.   TALTZ 80 MG/ML SOAJ Inject 1 ml (8m) under the skin every 4 weeks.   tirzepatide ([facility] 5 MG/0.5ML Pen Inject 5 mg into the skin once a week.   Vitamin D, Cholecalciferol, 1000 units CAPS Take 1,000 Units by mouth daily.      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 [employer] Tobacco Use   Smoking status: Never   Smokeless tobacco: Never  Vaping Use   Vaping Use: Never used  Substance and Sexual Activity   Alcohol use: Yes    Comment: 2/month   Drug use: No   Sexual activity: Yes  Other Topics Concern   Not on file  Social History Narrative   [place]  Wife works for [employer] teaching program   Owns [place] Strain: Low Risk  (12/09/2021)   Overall Financial Resource Strain (CARDIA)    Difficulty of Paying Living Expenses: Not hard at all  Food Insecurity: No Food Insecurity (12/09/2021)   Hunger Vital Sign    Worried About Running Out of Food in the Last Year: Never true    Ran Out of Food in the Last Year: Never true  Transportation Needs: No Transportation Needs (12/09/2021)   PRAPARE - Hydrologist (Medical): No     Lack of Transportation (Non-Medical): No  Physical Activity: Insufficiently Active (12/09/2021)   Exercise Vital Sign    Days of Exercise per Week: 3 days    Minutes of Exercise per Session: 30 min  Stress: Stress Concern Present (12/09/2021)   [place]    Feeling of Stress : To some extent  Social Connections: Socially Integrated (12/09/2021)   Social Connection and Isolation Panel [NHANES]    Frequency of Communication with Friends and Family: More than three times a week    Frequency of Social Gatherings with Friends and Family: More than three times a week    Attends Religious Services: More than 4 times per year    Active Member of [employer] or Organizations: Yes    Attends Music therapist: More than 4 times per year    Marital Status: Married     Family History: The patient's family history includes Cancer in an other family member; Crohn's disease in his mother; Esophageal cancer in his father; Obesity in an other family member; Uterine cancer (age of onset: 72) in his mother.  ROS:   Please see the history of present illness.     All other systems reviewed and are negative.  EKGs/Labs/Other Studies Reviewed:    The following studies were reviewed today:   EKG:   June 23, 2022: Atrial fibrillation with a rate of 83.  Cannot rule out previous inferior wall myocardial infarction.  Poor R wave progression.  Cannot rule out anteroseptal myocardial infarction  Recent Labs: 04/14/2022: ALT 21; BUN 22; Creatinine 1.18; Hemoglobin 16.1; Platelet Count 163; Potassium 3.8; Sodium 134  Recent Lipid Panel    Component Value Date/Time   CHOL 105 02/06/2022 0959   TRIG 203 (H) 02/06/2022 0959   HDL 37 (L) 02/06/2022 0959   CHOLHDL 2.8 02/06/2022 0959   CHOLHDL 4.1 10/13/2016 0956   VLDL 65 (H) 10/13/2016 0956   LDLCALC 36 02/06/2022 0959    Physical Exam:      Physical Exam: Blood pressure  128/84, pulse 83, height 5' 9.8" (1.773 m), weight 275 lb (124.7 kg), SpO2 97 %.       GEN:  morbidly obese male,  in no acute distress HEENT: Normal NECK: No JVD; No carotid bruits LYMPHATICS: No lymphadenopathy CARDIAC: irreg. Irreg.  RESPIRATORY:  Clear to auscultation without rales, wheezing or rhonchi  ABDOMEN: Soft, non-tender, non-distended MUSCULOSKELETAL:  No edema; No deformity  SKIN: Warm and dry NEUROLOGIC:  Alert and oriented x 3    ASSESSMENT:    1. Chronic combined systolic and diastolic heart failure ([place])   2. Longstanding persistent atrial fibrillation (HCC)     PLAN:       Chronic systolic congestive heart failure:    Last echocardiogram was performed 4 years ago.  He has moderate left ventricular  dysfunction.  He is currently on Jardiance, low-dose Entresto, carvedilol.  We will repeat his echo to see how his LV function is doing.  I would have a low threshold to try him on higher doses of Entresto.  I will see him in 3 months for follow-up visit.     2.  Atrial fibrillation:   He has persistent atrial fibrillation.  He has had a PFO closure device.  This quite likely precludes any attempt at A-fib ablation.  He has had numerous cardioversions but now has persistent/chronic atrial fibrillation. We discussed having him see an electrophysiologist.  He used to see the Thompson Grayer before his retirement.  I have asked him to work on weight loss.    3.  Hyperlipidemia:     This SmartLink has not been configured with any valid records.   Lab Results  Component Value Date   CHOL 102 06/09/2021   HDL 42 06/09/2021   LDLCALC 38 06/09/2021   TRIG 120 06/09/2021   CHOLHDL 2.4 06/09/2021    Cont current meds    Medication Adjustments/Labs and Tests Ordered: Current medicines are reviewed at length with the patient today.  Concerns regarding medicines are outlined above.  Orders Placed This Encounter  Procedures   EKG 12-Lead   ECHOCARDIOGRAM  COMPLETE    No orders of the defined types were placed in this encounter.    Patient Instructions  Medication Instructions:  Your physician recommends that you continue on your current medications as directed. Please refer to the Current Medication list given to you today.  *If you need a refill on your cardiac medications before your next appointment, please call your pharmacy*   Lab Work: NONE If you have labs (blood work) drawn today and your tests are completely normal, you will receive your results only by: [place] (if you have MyChart) OR A paper copy in the mail If you have any lab test that is abnormal or we need to change your treatment, we will call you to review the results.   Testing/Procedures: ECHO Your physician has requested that you have an echocardiogram. Echocardiography is a painless test that uses sound waves to create images of your heart. It provides your doctor with information about the size and shape of your heart and how well your heart's chambers and valves are working. This procedure takes approximately one hour. There are no restrictions for this procedure. Please do NOT wear cologne, perfume, aftershave, or lotions (deodorant is allowed). Please arrive 15 minutes prior to your appointment time.  Follow-Up: At [facility], you and your health needs are our priority.  As part of our continuing mission to provide you with exceptional heart care, we have created designated Provider Care Teams.  These Care Teams include your primary Cardiologist (physician) and Advanced Practice Providers (APPs -  Physician Assistants and Nurse Practitioners) who all work together to provide you with the care you need, when you need it.  Your next appointment:   3 month(s)  The format for your next appointment:   In Person  Provider:   Mertie Moores, MD       Important Information About Sugar         Signed, Mertie Moores, MD  06/23/2022  5:50 PM    [place]

## 2022-06-24 ENCOUNTER — Other Ambulatory Visit (HOSPITAL_COMMUNITY): Payer: Self-pay

## 2022-06-24 DIAGNOSIS — Z85828 Personal history of other malignant neoplasm of skin: Secondary | ICD-10-CM | POA: Diagnosis not present

## 2022-06-24 DIAGNOSIS — D1801 Hemangioma of skin and subcutaneous tissue: Secondary | ICD-10-CM | POA: Diagnosis not present

## 2022-06-24 DIAGNOSIS — L400 Psoriasis vulgaris: Secondary | ICD-10-CM | POA: Diagnosis not present

## 2022-06-24 DIAGNOSIS — Z79899 Other long term (current) drug therapy: Secondary | ICD-10-CM | POA: Diagnosis not present

## 2022-06-24 DIAGNOSIS — L821 Other seborrheic keratosis: Secondary | ICD-10-CM | POA: Diagnosis not present

## 2022-06-24 DIAGNOSIS — L738 Other specified follicular disorders: Secondary | ICD-10-CM | POA: Diagnosis not present

## 2022-06-25 ENCOUNTER — Encounter: Payer: Self-pay | Admitting: Internal Medicine

## 2022-06-25 ENCOUNTER — Other Ambulatory Visit (HOSPITAL_COMMUNITY): Payer: Self-pay

## 2022-06-29 ENCOUNTER — Other Ambulatory Visit: Payer: Self-pay | Admitting: Family Medicine

## 2022-06-29 ENCOUNTER — Other Ambulatory Visit (HOSPITAL_COMMUNITY): Payer: Self-pay

## 2022-06-29 DIAGNOSIS — E1169 Type 2 diabetes mellitus with other specified complication: Secondary | ICD-10-CM

## 2022-06-29 MED ORDER — ROSUVASTATIN CALCIUM 10 MG PO TABS
10.0000 mg | ORAL_TABLET | Freq: Every day | ORAL | 1 refills | Status: DC
  Filled 2022-06-29: qty 90, 90d supply, fill #0
  Filled 2022-09-27: qty 90, 90d supply, fill #1

## 2022-07-06 ENCOUNTER — Encounter: Payer: Self-pay | Admitting: Internal Medicine

## 2022-07-06 ENCOUNTER — Encounter: Payer: Self-pay | Admitting: Cardiovascular Disease

## 2022-07-06 ENCOUNTER — Other Ambulatory Visit (HOSPITAL_COMMUNITY): Payer: Self-pay

## 2022-07-06 ENCOUNTER — Other Ambulatory Visit: Payer: Self-pay | Admitting: Cardiovascular Disease

## 2022-07-06 DIAGNOSIS — I5042 Chronic combined systolic (congestive) and diastolic (congestive) heart failure: Secondary | ICD-10-CM

## 2022-07-06 DIAGNOSIS — Z024 Encounter for examination for driving license: Secondary | ICD-10-CM

## 2022-07-06 MED ORDER — CARVEDILOL 25 MG PO TABS
25.0000 mg | ORAL_TABLET | Freq: Two times a day (BID) | ORAL | 9 refills | Status: DC
  Filled 2022-07-06: qty 60, 30d supply, fill #0
  Filled 2022-08-05: qty 60, 30d supply, fill #1
  Filled 2022-09-04: qty 60, 30d supply, fill #2
  Filled 2022-10-02: qty 60, 30d supply, fill #3
  Filled 2022-11-03: qty 60, 30d supply, fill #4

## 2022-07-08 ENCOUNTER — Other Ambulatory Visit (HOSPITAL_COMMUNITY): Payer: Self-pay

## 2022-07-09 ENCOUNTER — Ambulatory Visit (HOSPITAL_COMMUNITY): Payer: 59 | Attending: Cardiology

## 2022-07-09 DIAGNOSIS — I5042 Chronic combined systolic (congestive) and diastolic (congestive) heart failure: Secondary | ICD-10-CM | POA: Diagnosis not present

## 2022-07-09 DIAGNOSIS — I4811 Longstanding persistent atrial fibrillation: Secondary | ICD-10-CM | POA: Diagnosis not present

## 2022-07-09 LAB — ECHOCARDIOGRAM COMPLETE
Area-P 1/2: 4.48 cm2
S' Lateral: 2.8 cm

## 2022-07-09 MED ORDER — PERFLUTREN LIPID MICROSPHERE
1.0000 mL | INTRAVENOUS | Status: AC | PRN
  Administered 2022-07-09: 2 mL via INTRAVENOUS

## 2022-07-13 ENCOUNTER — Ambulatory Visit: Payer: 59 | Admitting: Internal Medicine

## 2022-07-13 ENCOUNTER — Other Ambulatory Visit (HOSPITAL_COMMUNITY): Payer: Self-pay

## 2022-07-13 ENCOUNTER — Other Ambulatory Visit: Payer: Self-pay

## 2022-07-13 ENCOUNTER — Encounter: Payer: Self-pay | Admitting: Internal Medicine

## 2022-07-13 VITALS — BP 126/85 | HR 70 | Temp 97.9°F | Ht 70.0 in | Wt 272.0 lb

## 2022-07-13 DIAGNOSIS — T8453XD Infection and inflammatory reaction due to internal right knee prosthesis, subsequent encounter: Secondary | ICD-10-CM | POA: Diagnosis not present

## 2022-07-13 DIAGNOSIS — L819 Disorder of pigmentation, unspecified: Secondary | ICD-10-CM

## 2022-07-13 DIAGNOSIS — Z96659 Presence of unspecified artificial knee joint: Secondary | ICD-10-CM | POA: Diagnosis not present

## 2022-07-13 DIAGNOSIS — L409 Psoriasis, unspecified: Secondary | ICD-10-CM | POA: Diagnosis not present

## 2022-07-13 DIAGNOSIS — T8459XD Infection and inflammatory reaction due to other internal joint prosthesis, subsequent encounter: Secondary | ICD-10-CM

## 2022-07-13 LAB — SEDIMENTATION RATE: Sed Rate: 2 mm/h (ref 0–20)

## 2022-07-13 LAB — C-REACTIVE PROTEIN: CRP: 0.7 mg/L (ref <8.0)

## 2022-07-13 MED ORDER — CEFADROXIL 500 MG PO CAPS
1000.0000 mg | ORAL_CAPSULE | Freq: Two times a day (BID) | ORAL | 5 refills | Status: DC
  Filled 2022-07-13: qty 120, 30d supply, fill #0
  Filled 2022-08-05: qty 120, 30d supply, fill #1
  Filled 2022-09-04: qty 120, 30d supply, fill #2
  Filled 2022-09-28: qty 120, 30d supply, fill #3
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
## 2022-07-14 ENCOUNTER — Other Ambulatory Visit (HOSPITAL_COMMUNITY): Payer: Self-pay

## 2022-07-14 MED ORDER — INFLUENZA VAC SPLIT QUAD 0.5 ML IM SUSY
0.5000 mL | PREFILLED_SYRINGE | Freq: Once | INTRAMUSCULAR | 0 refills | Status: AC
  Filled 2022-07-14: qty 0.5, 1d supply, fill #0

## 2022-07-16 ENCOUNTER — Ambulatory Visit: Payer: 59

## 2022-07-16 ENCOUNTER — Telehealth: Payer: Self-pay | Admitting: Radiology

## 2022-07-16 ENCOUNTER — Other Ambulatory Visit (HOSPITAL_COMMUNITY): Payer: Self-pay

## 2022-07-16 DIAGNOSIS — I4811 Longstanding persistent atrial fibrillation: Secondary | ICD-10-CM

## 2022-07-16 DIAGNOSIS — Z024 Encounter for examination for driving license: Secondary | ICD-10-CM

## 2022-07-16 DIAGNOSIS — I5042 Chronic combined systolic (congestive) and diastolic (congestive) heart failure: Secondary | ICD-10-CM

## 2022-07-16 NOTE — Telephone Encounter (Signed)
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 Before starting the patient mentioned he has always had to have a lexiscan test but was willing to try the treadmill. After placing EKG leads on the patient I discovered he is in afib 100%. Took to the DOD. Appt was cancelled for today and patient knows he will hear from his doctors nurse to potentially order a nuclear ([place])

## 2022-07-17 ENCOUNTER — Other Ambulatory Visit: Payer: Self-pay | Admitting: Cardiovascular Disease

## 2022-07-17 ENCOUNTER — Telehealth (HOSPITAL_COMMUNITY): Payer: Self-pay | Admitting: *Deleted

## 2022-07-17 DIAGNOSIS — I5043 Acute on chronic combined systolic (congestive) and diastolic (congestive) heart failure: Secondary | ICD-10-CM

## 2022-07-17 NOTE — Telephone Encounter (Signed)
Patient given detailed instructions per Myocardial Perfusion Study Information Sheet for the test on 07/20/2022 at 8:00. Patient notified to arrive 15 minutes early and that it is imperative to arrive on time for appointment to keep from having the test rescheduled.  If you need to cancel or reschedule your appointment, please call the office within 24 hours of your appointment. . Patient verbalized understanding.[name]

## 2022-07-20 ENCOUNTER — Other Ambulatory Visit (HOSPITAL_COMMUNITY): Payer: Self-pay

## 2022-07-20 ENCOUNTER — Ambulatory Visit (HOSPITAL_COMMUNITY): Payer: 59 | Attending: Internal Medicine

## 2022-07-20 DIAGNOSIS — Z024 Encounter for examination for driving license: Secondary | ICD-10-CM

## 2022-07-20 DIAGNOSIS — I4811 Longstanding persistent atrial fibrillation: Secondary | ICD-10-CM

## 2022-07-20 DIAGNOSIS — I5042 Chronic combined systolic (congestive) and diastolic (congestive) heart failure: Secondary | ICD-10-CM

## 2022-07-20 LAB — MYOCARDIAL PERFUSION IMAGING
Angina Index: 0
Duke Treadmill Score: 5
Estimated workload: 6.4
Exercise duration (min): 4 min
Exercise duration (sec): 30 s
LV dias vol: 108 mL (ref 62–150)
LV sys vol: 55 mL
MPHR: 157 {beats}/min
Nuc Stress EF: 49 %
Peak HR: 150 {beats}/min
Percent HR: 96 %
RPE: 18
Rest HR: 81 {beats}/min
Rest Nuclear Isotope Dose: 10.8 mCi
SDS: 1
SRS: 0
SSS: 1
ST Depression (mm): 0 mm
Stress Nuclear Isotope Dose: 31.8 mCi
TID: 0.92

## 2022-07-20 MED ORDER — TECHNETIUM TC 99M TETROFOSMIN IV KIT
10.8000 | PACK | Freq: Once | INTRAVENOUS | Status: AC | PRN
  Administered 2022-07-20: 10.8 via INTRAVENOUS

## 2022-07-20 MED ORDER — TECHNETIUM TC 99M TETROFOSMIN IV KIT
31.8000 | PACK | Freq: Once | INTRAVENOUS | Status: AC | PRN
  Administered 2022-07-20: 31.8 via INTRAVENOUS

## 2022-07-21 ENCOUNTER — Encounter: Payer: Self-pay | Admitting: Internal Medicine

## 2022-07-21 ENCOUNTER — Other Ambulatory Visit: Payer: Self-pay | Admitting: Pharmacist

## 2022-07-21 ENCOUNTER — Encounter: Payer: Self-pay | Admitting: Cardiovascular Disease

## 2022-07-21 ENCOUNTER — Other Ambulatory Visit (HOSPITAL_COMMUNITY): Payer: Self-pay

## 2022-07-21 MED ORDER — TALTZ 80 MG/ML ~~LOC~~ SOAJ
SUBCUTANEOUS | 10 refills | Status: DC
  Filled 2022-07-21: qty 1, fill #0

## 2022-07-21 MED ORDER — TALTZ 80 MG/ML ~~LOC~~ SOAJ
SUBCUTANEOUS | 10 refills | Status: DC
  Filled 2022-07-21: qty 1, 30d supply, fill #0
  Filled 2022-08-14 – 2022-09-01 (×5): qty 1, 30d supply, fill #1
  Filled 2022-09-21: qty 1, 30d supply, fill #2
  Filled 2022-10-21 (×2): qty 1, 30d supply, fill #3
  Filled 2022-11-13 (×2): qty 1, 30d supply, fill #4
  Filled 2022-12-09: qty 1, 30d supply, fill #5
  Filled 2023-01-05: qty 1, 30d supply, fill #6
  Filled 2023-02-03: qty 1, 30d supply, fill #7
  Filled 2023-03-02: qty 1, 30d supply, fill #8
  Filled 2023-04-12: qty 1, 30d supply, fill #9
  Filled 2023-05-10: qty 1, 30d supply, fill #10

## 2022-07-27 DIAGNOSIS — H524 Presbyopia: Secondary | ICD-10-CM | POA: Diagnosis not present

## 2022-08-05 ENCOUNTER — Other Ambulatory Visit (HOSPITAL_COMMUNITY): Payer: Self-pay

## 2022-08-06 ENCOUNTER — Other Ambulatory Visit (HOSPITAL_COMMUNITY): Payer: Self-pay

## 2022-08-12 ENCOUNTER — Other Ambulatory Visit (HOSPITAL_COMMUNITY): Payer: Self-pay

## 2022-08-13 ENCOUNTER — Encounter: Payer: Self-pay | Admitting: Internal Medicine

## 2022-08-13 ENCOUNTER — Ambulatory Visit: Payer: 59 | Admitting: Family Medicine

## 2022-08-13 ENCOUNTER — Other Ambulatory Visit (HOSPITAL_COMMUNITY): Payer: Self-pay

## 2022-08-14 ENCOUNTER — Other Ambulatory Visit: Payer: Self-pay

## 2022-08-14 ENCOUNTER — Other Ambulatory Visit (HOSPITAL_COMMUNITY): Payer: Self-pay

## 2022-08-15 ENCOUNTER — Encounter: Payer: Self-pay | Admitting: Internal Medicine

## 2022-08-17 ENCOUNTER — Other Ambulatory Visit (HOSPITAL_COMMUNITY): Payer: Self-pay

## 2022-08-22 ENCOUNTER — Other Ambulatory Visit: Payer: Self-pay | Admitting: Family Medicine

## 2022-08-22 ENCOUNTER — Other Ambulatory Visit: Payer: Self-pay | Admitting: Cardiovascular Disease

## 2022-08-22 DIAGNOSIS — K219 Gastro-esophageal reflux disease without esophagitis: Secondary | ICD-10-CM

## 2022-08-22 DIAGNOSIS — I4821 Permanent atrial fibrillation: Secondary | ICD-10-CM

## 2022-08-24 MED ORDER — RIVAROXABAN 20 MG PO TABS
20.0000 mg | ORAL_TABLET | Freq: Every day | ORAL | 1 refills | Status: DC
  Filled 2022-08-24: qty 90, 90d supply, fill #0
  Filled 2022-11-17: qty 90, 90d supply, fill #1

## 2022-08-25 ENCOUNTER — Other Ambulatory Visit (HOSPITAL_COMMUNITY): Payer: Self-pay

## 2022-08-25 MED ORDER — PANTOPRAZOLE SODIUM 40 MG PO TBEC
40.0000 mg | DELAYED_RELEASE_TABLET | Freq: Two times a day (BID) | ORAL | 0 refills | Status: DC
  Filled 2022-08-25: qty 180, 90d supply, fill #0

## 2022-09-01 ENCOUNTER — Encounter: Payer: Self-pay | Admitting: Internal Medicine

## 2022-09-01 ENCOUNTER — Other Ambulatory Visit: Payer: Self-pay

## 2022-09-01 ENCOUNTER — Other Ambulatory Visit (HOSPITAL_COMMUNITY): Payer: Self-pay

## 2022-09-04 ENCOUNTER — Other Ambulatory Visit: Payer: Self-pay

## 2022-09-04 ENCOUNTER — Other Ambulatory Visit (HOSPITAL_COMMUNITY): Payer: Self-pay

## 2022-09-04 ENCOUNTER — Other Ambulatory Visit: Payer: Self-pay | Admitting: Family Medicine

## 2022-09-04 DIAGNOSIS — M7918 Myalgia, other site: Secondary | ICD-10-CM

## 2022-09-04 MED ORDER — METHOCARBAMOL 500 MG PO TABS
500.0000 mg | ORAL_TABLET | Freq: Four times a day (QID) | ORAL | 0 refills | Status: DC | PRN
  Filled 2022-09-04: qty 40, 10d supply, fill #0

## 2022-09-16 ENCOUNTER — Encounter: Payer: Self-pay | Admitting: Internal Medicine

## 2022-09-17 ENCOUNTER — Other Ambulatory Visit (HOSPITAL_COMMUNITY): Payer: Self-pay

## 2022-09-21 ENCOUNTER — Other Ambulatory Visit (HOSPITAL_COMMUNITY): Payer: Self-pay

## 2022-09-22 ENCOUNTER — Other Ambulatory Visit: Payer: Self-pay

## 2022-09-22 ENCOUNTER — Encounter: Payer: Self-pay | Admitting: Cardiovascular Disease

## 2022-09-22 NOTE — Progress Notes (Unsigned)
Cardiology Office Note:    Date:  09/23/2022   ID:  William Barges., DOB Jun 27, 1961, MRN [id number]  PCP:  William Mussel, MD  Cardiologist:  [name]   / [name]  Electrophysiologist:  None   Referring MD: William Lung, MD   1.   PFO closure 2.  Atrial fib 3.   Chronic systolic CHF   Chief Complaint  Patient presents with   Congestive Heart Failure        Atrial Fibrillation     Jan.  6, 2020    William Plagge. is a 62 y.o. male with a hx of persistent atrial fibrillation, dyslipidemia, obstructive sleep apnea He has been seen in the past by Dr. [name].  He is seen currently by Dr. Thompson Delgado and was furred to me for general cardiology issues.  Hx of HTN, PFO - s/p occlusion device.   Hx of CHF  Hx of OSA    Seen with his wife , [name]  Has seen [name] in the past  Has a PFO occlusion device following a TIA   Has had bilateral knee replacements ,  Both in Oct. 2019.   [name] increased his Coreg to 25  BID ,  Has some fatigue related to this increase  Tolerated the lower dose better .   Is on crutches today ,  Typically gets 7000-8000 steps a day   No CP, no dyspnea,  No sweats, no  PND.  No fever, rash, Sleeps well with CPAP.    Works as a Clinical biochemist   Oncologist and [place] )  His son played [place] with my son   Feb. 24, 2020:  [name] is seen today for follow-up of his atrial fibrillation, dyslipidemia struct of sleep apnea.  Has had bilateral knee replacement  Has has an infected right  knee joint ( MSSA ) .   Had a right knee poly exchange  Had 6 weeks of Ancef.     Is on Rifampin for 6 months  Is on Lovenox for now .   Does not want to do coumadin  Doesn't think he wants to go through rehab for his last knee surgery  Has been started on Jardiance. Has lost 50 lbs since Oct.   [name] 25, 2021: [name] is seen back today for his follow-up of his atrial fibrillation, dyslipidemia, and his chronic systolic congestive heart  failure.  Wt is 275 lbs .   Has continued to have issues with his infected right knee prosthesis  Has had 5 operations on his right knee.   Had his 3rd R TKA . He is feeling better.   Is back on xarelto   Still doing general contracting     April 01, 2020: [name] is seen today for follow-up of his atrial fibrillation, dyslipidemia, chronic systolic congestive heart failure. Weight is 278 pounds No complaints.  No CP, no dyspnea. Walks 7000 steps a day .  Prosthetic R knee issues - slowly improving.    Still has fluid around his knee.  Still on Abx.  Labs reveiewd  - LDL is great - 30 Trigs are 228. Is on a keto diet . Has lost 15 lbs recently .      Oct. 2022:89 Seen with his wife, [name].   [name] is seen today for follow up of his atrial fib, dyslipidemia , chronic systolic CHF Has OSA, wears his CPAP.  Has thrombocytosis  Is seeing oncology  sees Dr. Annamaria Delgado ( pulmonary )   S/p PFO closure  Wt = 261 lbs - has lost 30-llbs over the year  Has had multiple R knee surgeries - all from a persistent infection that has not cleared  Is back walking some ,   does normal activity .  Advised more cycling compared to walking  No CP , no syncope .  Occasional dizziness ( associated with blurred vision ) improves with some glucose,   he thinks its due to hypoglycemia   Has moderate LV dysfunction  - EF 35-40%.   [name]: Seen today for follow-up of his atrial fibrillation, dyslipidemia, chronic systolic congestive heart failure. He has obstructive sleep apnea.  He wears a CPAP.  Weight today is 275 pounds which is up 14 pounds from last year.  Is on Montjaro for the past 2 months ,  has lost 12 lbs since starting  Has gastroparesis after the last shot   Had an infected knee replacement  Stil on suppressant dose Doxycycline 100 mg BID    Has had Afib for the past 10 years  Has had at least 5 times .   We discussed Afib ablation  He is not sure if he wants to have the  procedure  Has had a FPO occlusion device 15 years ago for a TIA ,  This may prevent a successful Afib ablation    [name] 24, 2024: [name] is seen today for follow-up of his atrial fibrillation.  He has had an ASD closure device which may preclude an atrial fibrillation.  We discussed having him see the electrophysiologist to discuss that possibility. Wt is 268  We again discussed Afib ablation. He does not feel his Afib.   Does not think he wants to go through with the ablation .   We discussed starting Tikosyn.  He is not interested in Tikosyn   Has started [facility]        Past Medical History:  Diagnosis Date   Arthritis    Diabetes mellitus without complication ([place])    TYPE 2   Diverticulosis    Dyslipidemia    GERD (gastroesophageal reflux disease)    Herpes labialis    Hypertension    Longstanding persistent atrial fibrillation (HCC)    Metabolic syndrome    Obesity    Proteinuria    Psoriasis    Seborrheic dermatitis    Sleep apnea    very compliant with CPAP, PT NEEDS TO BRING OWN MACHINE   TIA (transient ischemic attack) 2009    Past Surgical History:  Procedure Laterality Date   CARDIOVERSION N/A 12/02/2016   Procedure: CARDIOVERSION;  Surgeon: William Casino, MD;  Location: [place];  Service: Cardiovascular;  Laterality: N/A;   COLONOSCOPY     EXCISIONAL TOTAL KNEE ARTHROPLASTY WITH ANTIBIOTIC SPACERS Right 03/28/2019   Procedure: EXCISIONAL TOTAL KNEE ARTHROPLASTY WITH ANTIBIOTIC SPACERS;  Surgeon: [name], MD;  Location: [facility];  Service: Orthopedics;  Laterality: Right;  90 mins   I & D KNEE WITH POLY EXCHANGE Right 09/10/2018   Procedure: Right Knee Arthroplasty IRRIGATION AND DEBRIDEMENT KNEE WITH POLY EXCHANGE;  Surgeon: [name], MD;  Location: [facility];  Service: Orthopedics;  Laterality: Right;   I & D KNEE WITH POLY EXCHANGE Right 06/25/2020   Procedure: Open excisional and non excisional debridement right knee, possible aspiration  versus open arthrotomy poly exchange;  Surgeon: [name], MD;  Location[facility];  Service: Orthopedics;  Laterality: Right;  IRRIGATION AND DEBRIDEMENT KNEE Right 07/09/2020   Procedure: IRRIGATION AND DEBRIDEMENT KNEE;  Surgeon: [name], MD;  [facility];  Service: Orthopedics;  Laterality: Right;   JOINT REPLACEMENT  11/03/10   LT HIP   PFO occluder cardiac valve  2006   [name], (hole in heart)   REIMPLANTATION OF TOTAL KNEE Right 06/15/2019   Procedure: Resection of antibiotic spacer and  irrigation and debridment and placement of new antibiotic spacer and components;  Surgeon: [name], MD;  [facility];  Service: Orthopedics;  Laterality: Right;  90 mins   REIMPLANTATION OF TOTAL KNEE Right 08/22/2019   Procedure: REIMPLANTATION OF TOTAL KNEE;  Surgeon: [name], MD;  [facility];  Service: Orthopedics;  Laterality: Right;  120 mins   SKIN BIOPSY Left 12/22/2019   epidermal inclusion cyst   TONSILLECTOMY AND ADENOIDECTOMY     TOTAL HIP ARTHROPLASTY Left    TOTAL KNEE ARTHROPLASTY Right 05/31/2018   Procedure: RIGHT TOTAL KNEE ARTHROPLASTY;  Surgeon: [name], MD;  [facility];  Service: Orthopedics;  Laterality: Right;  70 mins   TOTAL KNEE ARTHROPLASTY Left 06/28/2018   Procedure: LEFT TOTAL KNEE ARTHROPLASTY;  Surgeon: [name], MD;  [facility];  Service: Orthopedics;  Laterality: Left;  70 mins   WISDOM TOOTH EXTRACTION      Current Medications: Current Meds  Medication Sig   acetaminophen (TYLENOL) 500 MG tablet Take 500 mg by mouth every 6 (six) hours as needed.   carvedilol (COREG) 25 MG tablet Take 1 tablet (25 mg total) by mouth 2 (two) times daily.   clobetasol cream (TEMOVATE) 0.05 % APPLY TOPICALLY TO AFFECTED AREA TWICE DAILY.   empagliflozin (JARDIANCE) 10 MG TABS tablet Take 1 tablet (10 mg total) by mouth daily.   glucose blood test strip 1 each by Other route as needed. Use as instructed Freestyle test stripts    glucose monitoring kit (FREESTYLE) monitoring kit 1 each by Does not apply route as needed.   Lancets (FREESTYLE) lancets 1 each by Other route as needed. Use as instructed   methocarbamol (ROBAXIN) 500 MG tablet Take 1 tablet (500 mg total) by mouth every 6 (six) hours as needed for muscle spasms.   Multiple Vitamin (MULTIVITAMIN WITH MINERALS) TABS tablet Take 1 tablet by mouth daily.   Oxymetazoline HCl (NASAL SPRAY) 0.05 % SOLN Place into the nose.   pantoprazole (PROTONIX) 40 MG tablet Take 1 tablet (40 mg total) by mouth 2 (two) times daily.   rivaroxaban (XARELTO) 20 MG TABS tablet Take 1 tablet (20 mg total) by mouth daily with supper.   rosuvastatin (CRESTOR) 10 MG tablet Take 1 tablet (10 mg total) by mouth daily.   sacubitril-valsartan (ENTRESTO) 24-26 MG Take 1 tablet by mouth 2 (two) times daily.   TALTZ 80 MG/ML SOAJ Inject 1 ml ('80mg'$ ) under the skin every 4 weeks.   tirzepatide [facility]) 5 MG/0.5ML Pen Inject 5 mg into the skin once a week.   Vitamin D, Cholecalciferol, 1000 units CAPS Take 1,000 Units by mouth daily.      Allergies:   Morphine and related   Social History   Socioeconomic History   Marital status: Married    Spouse name: Not on file   Number of children: 2   Years of education: Not on file   Highest education level: Bachelor's degree (e.g., BA, AB, BS)  Occupational History   Occupation: CONTRACTER    Employer: [employer]  Tobacco Use  Smoking status: Never   Smokeless tobacco: Never  Vaping Use   Vaping Use: Never used  Substance and Sexual Activity   Alcohol use: Yes    Comment: rarely   Drug use: No   Sexual activity: Yes  Other Topics Concern   Not on file  Social History Narrative   Lives with spouse in [place]   Wife works for [employer] teaching program   Owns Advertising copywriter   Social Determinants of Health   Financial Resource Strain: [place]  (4/11/Delgado)   Overall Financial Resource Strain (CARDIA)     Difficulty of Paying Living Expenses: Not hard at all  Food Insecurity: No Food Insecurity (4/11/Delgado)   Hunger Vital Sign    Worried About Running Out of Food in the Last Year: Never true    Ran Out of Food in the Last Year: Never true  Transportation Needs: No Transportation Needs (4/11/Delgado)   PRAPARE - Hydrologist (Medical): No    Lack of Transportation (Non-Medical): No  Physical Activity: Insufficiently Active (4/11/Delgado)   Exercise Vital Sign    Days of Exercise per Week: 3 days    Minutes of Exercise per Session: 30 min  Stress: Stress Concern Present (4/11/Delgado)   [place]    Feeling of Stress : To some extent  Social Connections: Socially Integrated (4/11/Delgado)   Social Connection and Isolation Panel [NHANES]    Frequency of Communication with Friends and Family: More than three times a week    Frequency of Social Gatherings with Friends and Family: More than three times a week    Attends Religious Services: More than 4 times per year    Active Member of [employer] or Organizations: Yes    Attends Music therapist: More than 4 times per year    Marital Status: Married     Family History: The patient's family history includes Cancer in an other family member; Crohn's disease in his mother; Esophageal cancer in his father; Obesity in an other family member; Uterine cancer (age of onset: 4) in his mother.  ROS:   Please see the history of present illness.     All other systems reviewed and are negative.  EKGs/Labs/Other Studies Reviewed:        EKG:      Recent Labs: 8/15/Delgado: ALT 21; BUN 22; Creatinine 1.18; Hemoglobin 16.1; Platelet Count 163; Potassium 3.8; Sodium 134  Recent Lipid Panel    Component Value Date/Time   CHOL 105 06/09/Delgado 0959   TRIG 203 (H) 06/09/Delgado 0959   HDL 37 (L) 06/09/Delgado 0959   CHOLHDL 2.8 06/09/Delgado 0959   CHOLHDL 4.1  10/13/2016 0956   VLDL 65 (H) 10/13/2016 0956   LDLCALC 36 06/09/Delgado 0959    Physical Exam:     Physical Exam: Blood pressure 116/74, pulse 80, height '5\' 10"'$  (1.778 m), weight 268 lb 6.4 oz (121.7 kg), SpO2 97 %.       GEN:  moderately obese male,  very pleasant ,  in no acute distress HEENT: Normal NECK: No JVD; No carotid bruits LYMPHATICS: No lymphadenopathy CARDIAC: irregl. Irregl  RESPIRATORY:  Clear to auscultation without rales, wheezing or rhonchi  ABDOMEN: Soft, non-tender, non-distended MUSCULOSKELETAL:  No edema; No deformity  SKIN: Warm and dry NEUROLOGIC:  Alert and oriented x 3    ASSESSMENT:    1. Permanent atrial fibrillation ([place])   2. Hyperlipidemia associated with type 2  diabetes mellitus ([place])   3. Obesity due to excess calories, unspecified classification, unspecified whether serious comorbidity present      PLAN:        Chronic systolic congestive heart failure:    Echocardiogram from November, Delgado reveals LVEF of 55 to 60%.  We are we were not able to assess his diastolic function.  Continue medical therapy.    2.  Atrial fibrillation:   He has persistent atrial fibrillation.  He has had a PFO closure device.  This quite likely precludes any attempt at A-fib ablation.  He has had numerous cardioversions but now has persistent/chronic atrial fibrillation.  We once again discussed ablation.  We also discussed starting dofetilide.  He is not interested in either 1 of these at this time.  He is completely asymptomatic from an A-fib standpoint.  Will continue with anticoagulation and rate control.       3.  Hyperlipidemia:     This SmartLink has not been configured with any valid records.   Lab Results  Component Value Date   CHOL 102 06/09/2021   HDL 42 06/09/2021   LDLCALC 38 06/09/2021   TRIG 120 06/09/2021   CHOLHDL 2.4 06/09/2021    Cont current meds    Medication Adjustments/Labs and Tests Ordered: Current medicines are  reviewed at length with the patient today.  Concerns regarding medicines are outlined above.  No orders of the defined types were placed in this encounter.   No orders of the defined types were placed in this encounter.    Patient Instructions  Medication Instructions:  Your physician recommends that you continue on your current medications as directed. Please refer to the Current Medication list given to you today.  *If you need a refill on your cardiac medications before your next appointment, please call your pharmacy*   Lab Work: NONE If you have labs (blood work) drawn today and your tests are completely normal, you will receive your results only by: [place] (if you have MyChart) OR A paper copy in the mail If you have any lab test that is abnormal or we need to change your treatment, we will call you to review the results.   Testing/Procedures: NONE   Follow-Up: At [facility], you and your health needs are our priority.  As part of our continuing mission to provide you with exceptional heart care, we have created designated Provider Care Teams.  These Care Teams include your primary Cardiologist (physician) and Advanced Practice Providers (APPs -  Physician Assistants and Nurse Practitioners) who all work together to provide you with the care you need, when you need it.  We recommend signing up for the patient portal called "MyChart".  Sign up information is provided on this After Visit Summary.  MyChart is used to connect with patients for Virtual Visits (Telemedicine).  Patients are able to view lab/test results, encounter notes, upcoming appointments, etc.  Non-urgent messages can be sent to your provider as well.   To learn more about what you can do with MyChart, go to [email or web address].    Your next appointment:   6 month(s)  Provider:   Mertie Moores, MD  or APP      Signed, Mertie Moores, MD  09/23/2022 1:36 PM    [place]

## 2022-09-23 ENCOUNTER — Other Ambulatory Visit (HOSPITAL_COMMUNITY): Payer: Self-pay

## 2022-09-23 ENCOUNTER — Ambulatory Visit: Payer: 59 | Attending: Cardiovascular Disease | Admitting: Cardiovascular Disease

## 2022-09-23 ENCOUNTER — Encounter: Payer: Self-pay | Admitting: Cardiovascular Disease

## 2022-09-23 ENCOUNTER — Encounter: Payer: Self-pay | Admitting: Internal Medicine

## 2022-09-23 VITALS — BP 116/74 | HR 80 | Ht 70.0 in | Wt 268.4 lb

## 2022-09-23 DIAGNOSIS — E6609 Other obesity due to excess calories: Secondary | ICD-10-CM | POA: Diagnosis not present

## 2022-09-23 DIAGNOSIS — I4821 Permanent atrial fibrillation: Secondary | ICD-10-CM

## 2022-09-23 DIAGNOSIS — E785 Hyperlipidemia, unspecified: Secondary | ICD-10-CM | POA: Diagnosis not present

## 2022-09-23 DIAGNOSIS — E1169 Type 2 diabetes mellitus with other specified complication: Secondary | ICD-10-CM

## 2022-09-23 NOTE — Patient Instructions (Signed)
Medication Instructions:  Your physician recommends that you continue on your current medications as directed. Please refer to the Current Medication list given to you today.  *If you need a refill on your cardiac medications before your next appointment, please call your pharmacy*   Lab Work: NONE If you have labs (blood work) drawn today and your tests are completely normal, you will receive your results only by: [place] (if you have MyChart) OR A paper copy in the mail If you have any lab test that is abnormal or we need to change your treatment, we will call you to review the results.   Testing/Procedures: NONE   Follow-Up: At [facility], you and your health needs are our priority.  As part of our continuing mission to provide you with exceptional heart care, we have created designated Provider Care Teams.  These Care Teams include your primary Cardiologist (physician) and Advanced Practice Providers (APPs -  Physician Assistants and Nurse Practitioners) who all work together to provide you with the care you need, when you need it.  We recommend signing up for the patient portal called "MyChart".  Sign up information is provided on this After Visit Summary.  MyChart is used to connect with patients for Virtual Visits (Telemedicine).  Patients are able to view lab/test results, encounter notes, upcoming appointments, etc.  Non-urgent messages can be sent to your provider as well.   To learn more about what you can do with MyChart, go to [email or web address].    Your next appointment:   6 month(s)  Provider:   Mertie Moores, MD  or APP

## 2022-09-27 ENCOUNTER — Other Ambulatory Visit: Payer: Self-pay | Admitting: Family Medicine

## 2022-09-27 DIAGNOSIS — E1121 Type 2 diabetes mellitus with diabetic nephropathy: Secondary | ICD-10-CM

## 2022-09-28 ENCOUNTER — Other Ambulatory Visit (HOSPITAL_COMMUNITY): Payer: Self-pay

## 2022-09-28 MED ORDER — EMPAGLIFLOZIN 10 MG PO TABS
10.0000 mg | ORAL_TABLET | Freq: Every day | ORAL | 1 refills | Status: DC
  Filled 2022-09-28: qty 90, 90d supply, fill #0
  Filled 2022-12-22: qty 90, 90d supply, fill #1

## 2022-09-29 ENCOUNTER — Other Ambulatory Visit: Payer: Self-pay

## 2022-09-30 NOTE — Progress Notes (Unsigned)
HPI M never smoker followed for OSA, complicated by TIA, AFib, HTN, CHF, OSA, GERD, Hyperlipidemia, DM2, Nephropathy, Psoriasis, Psoriatic Arthritis, Polycythemia/ phlebotomy,  Obesity,  NPSG 10/26/05- AHI 47.3/ hr, body weight 300 lbs Walk Test on room air / 05/27/21- room air, 750 feet, "fast pace"- lowest O2 sat 95%, max HR 61/min. Overnight Oximetry on BIPAP/room air 08/11/21- lowest O2 sat 91%- does not qualify for sleep O2. ===============================================================   10/02/21- 36 yoM never smoker followed for OSA, complicated by TIA, PAFib, HTN, CHF, OSA, GERD, Hyperlipidemia, DM2, Nephropathy, Psoriasis, Psoriatic Arthritis, Polycythemia/ phlebotomy,  Obesity,  VPAP [employer] IPAP 11, EPAP 8   PS2   replacement ordered in Sept, 2022 Download- N/A Body weight today-278 lbs Covid vax-4 Phizer Flu vax-flu Overnight Oximetry on BIPAP/room air 08/11/21- lowest O2 sat 91%- does not qualify for sleep O2. His old BiPAP machine still works well.  He does not want to sleep without it and is waiting for replacement.  Overnight oximetry was good on BiPAP.  We have not demonstrated significant oxygen desaturation.  That was the question raised by Dr. Julien Nordmann. He has an adjustable bed.  He asked about alternative treatments but chooses to stay with BIPAP. He declines ENT referral. CXR 05/27/21 FINDINGS: Lung volumes are normal. No consolidative airspace disease. No pleural effusions. No pneumothorax. No pulmonary nodule or mass noted. Pulmonary vasculature and the cardiomediastinal silhouette are within normal limits. Atherosclerosis in the thoracic aorta. Atrial septal occluder device incidentally noted.  IMPRESSION: No radiographic evidence of acute cardiopulmonary disease.  10/01/22- 61 yoM never smoker followed for OSA, complicated by TIA, AFib, HTN, CHF, OSA, GERD, Hyperlipidemia, DM2, Nephropathy, Psoriasis, Psoriatic Arthritis, Polycythemia/ phlebotomy,  Obesity,  VPAP  [employer] IPAP 11, EPAP 8   PS2   replacement ordered in Sept, 2022 Download-  Body weight today- Covid vax-4 Phizer Flu vax-flu    ROS-see HPI   + = positive Constitutional:    weight loss, night sweats, fevers, chills, fatigue, lassitude. HEENT:    headaches, difficulty swallowing, tooth/dental problems, sore throat,       sneezing, itching, ear ache, nasal congestion, post nasal drip, snoring CV:    chest pain, orthopnea, PND, swelling in lower extremities, anasarca,                                  dizziness, palpitations Resp:   shortness of breath with exertion or at rest.                productive cough,   non-productive cough, coughing up of blood.              change in color of mucus.  wheezing.   Skin:    rash or lesions. GI:  No-   heartburn, indigestion, abdominal pain, nausea, vomiting, diarrhea,                 change in bowel habits, loss of appetite GU: dysuria, change in color of urine, no urgency or frequency.   flank pain. MS:   joint pain, stiffness, decreased range of motion, back pain. Neuro-     nothing unusual Psych:  change in mood or affect.  depression or anxiety.   memory loss.  OBJ- Physical Exam General- Alert, Oriented, Affect-appropriate, Distress- none acute, + overweight Skin- r+lower legs hyperpigmented c/w previous venous stasis Lymphadenopathy- none Head- atraumatic            Eyes-  Gross vision intact, PERRLA, conjunctivae and secretions clear            Ears- Hearing, canals-normal            Nose- Clear, no-Septal dev, mucus, polyps, erosion, perforation             Throat- Mallampati II-III , mucosa clear , drainage- none, tonsils- atrophic Neck- flexible , trachea midline, no stridor , thyroid nl, carotid no bruit Chest - symmetrical excursion , unlabored           Heart/CV- RRR almost regular , no murmur , no gallop  , no rub, nl s1 s2                           - JVD- none , edema- none, stasis changes- none, varices- none           Lung-  clear to P&A, wheeze- none, cough- none , dullness-none, rub- none           Chest wall-  Abd-  Br/ Gen/ Rectal- Not done, not indicated Extrem- cyanosis- none, No clubbing, none, atrophy- none, strength- nl Neuro- grossly intact to observation

## 2022-10-01 ENCOUNTER — Ambulatory Visit: Payer: 59 | Admitting: Internal Medicine

## 2022-10-01 ENCOUNTER — Encounter: Payer: Self-pay | Admitting: Internal Medicine

## 2022-10-01 VITALS — BP 110/72 | HR 74 | Ht 70.0 in | Wt 271.0 lb

## 2022-10-01 DIAGNOSIS — E669 Obesity, unspecified: Secondary | ICD-10-CM | POA: Diagnosis not present

## 2022-10-01 DIAGNOSIS — G4733 Obstructive sleep apnea (adult) (pediatric): Secondary | ICD-10-CM | POA: Diagnosis not present

## 2022-10-01 NOTE — Assessment & Plan Note (Signed)
Benefits from BIPAP with excellent compliance and control. Plan- continue BIPAP 11/8, PS 2

## 2022-10-01 NOTE — Patient Instructions (Signed)
We can continue VPAP auto 11/8, PS2  Glad you are doing well. Please cal if we can help

## 2022-10-01 NOTE — Assessment & Plan Note (Signed)
Discussed his experience with mountjaro Followed by cardiology

## 2022-10-02 ENCOUNTER — Other Ambulatory Visit (HOSPITAL_COMMUNITY): Payer: Self-pay

## 2022-10-06 ENCOUNTER — Encounter: Payer: Self-pay | Admitting: Internal Medicine

## 2022-10-06 DIAGNOSIS — E119 Type 2 diabetes mellitus without complications: Secondary | ICD-10-CM | POA: Insufficient documentation

## 2022-10-06 DIAGNOSIS — I5032 Chronic diastolic (congestive) heart failure: Secondary | ICD-10-CM | POA: Insufficient documentation

## 2022-10-06 DIAGNOSIS — I5030 Unspecified diastolic (congestive) heart failure: Secondary | ICD-10-CM | POA: Insufficient documentation

## 2022-10-06 NOTE — Progress Notes (Unsigned)
[facility]: Clinic Note  Subjective:  History of Present Illness: [name]. is a 62 y.o. year old male who presents to establish care with me.  [name]'S HUSBAND!!!   # OSA - Follows with Dr. Baird Lyons, Pulmonology - Benefits from BIPAP 11/8, PS 2  # Persistent atrial fibrillation - Follows with Dr. Acie Fredrickson, Cardiology  - Does not feel his Afib. Cardiology discussed ablation, but could be complicated by ASD closure device, patient not interested. Not interested in Tikosyn.  - Anticoagulation: Xarelto '20mg'$  daily  - Rate control: Carvedilol '25mg'$  BID   # HFpEF - Follows with Dr. Acie Fredrickson, Cardiology  - LVEF 55-60% in 07/2022 (was 35-40% in 05/2018)  - Jardiance '10mg'$  daily, Carvedilol '25mg'$  BID, Entresto 24-'26mg'$  BID   # T2DM - Jardiance '10mg'$  daily - Mounjaro '5mg'$  weekly  - Due for foot exam   # HLD - Rosuvastatin '10mg'$  daily. LDL 36.   # Psoriasis - Taltz (ixekizumab) injections?   - h/o TIA s/p PFO occlusion - bilateral knee replacements in 2703 complicated by infection. Sees Dr. Baxter Flattery q3 months in [place]. Had MSSA of R knee. Changed Doxy to Cefadroxil last time, as hyperpigmentation might have been from Doxy.    # Polycythemia - Follows with Dr. Julien Nordmann, Oncology - Negative JAK2 mutation - Phlebotomy as needed to keep Hct 45-50%  Tylenol  Cefadroxil Clobetasol cream Robaxin MVI Nasal spray Protonix '40mg'$  BID Vitamin D    Please refer to Assessment and Plan below for full details in Problem-Based Charting.   Past Medical History:  Patient Active Problem List   Diagnosis Date Noted   Chronic systolic CHF (congestive heart failure) ([place]) 09/27/2019   High risk medication use 11/08/2018   S/P left TKA 06/28/2018   S/P TKR (total knee replacement), right 05/31/2018   Tubular adenoma of colon 10/18/2017   BCE (basal cell epithelioma), face 06/21/2017   Diabetic nephropathy associated with type 2 diabetes mellitus ([place]) 10/13/2016    Permanent atrial fibrillation ([place]) 10/13/2016   Polycythemia vera ([place]) 10/09/2014   Hyperlipidemia associated with type 2 diabetes mellitus ([place]) 09/17/2014   Hypertension associated with diabetes ([place]) 09/17/2014   Diabetes mellitus ([place]) 10/31/2013   Hx-TIA (transient ischemic attack) 09/05/2012   GERD (gastroesophageal reflux disease) 08/17/2011   Obesity (BMI 30-39.9) 08/17/2011   Psoriatic arthritis ([place]) 08/17/2011   Obstructive sleep apnea 08/05/2009    Social History: Reviewed in [place]. Pertinent updates today include: ***  Family History: Reviewed in Epic. Pertinent updates today include: None  Medications:  Current Outpatient Medications:    acetaminophen (TYLENOL) 500 MG tablet, Take 500 mg by mouth every 6 (six) hours as needed., Disp: , Rfl:    carvedilol (COREG) 25 MG tablet, Take 1 tablet (25 mg total) by mouth 2 (two) times daily., Disp: 60 tablet, Rfl: 9   cefadroxil (DURICEF) 500 MG capsule, Take 2 capsules (1,000 mg total) by mouth 2 (two) times daily., Disp: 120 capsule, Rfl: 5   clobetasol cream (TEMOVATE) 0.05 %, APPLY TOPICALLY TO AFFECTED AREA TWICE DAILY., Disp: 30 g, Rfl: 0   empagliflozin (JARDIANCE) 10 MG TABS tablet, Take 1 tablet (10 mg total) by mouth daily., Disp: 90 tablet, Rfl: 1   glucose blood test strip, 1 each by Other route as needed. Use as instructed Freestyle test stripts, Disp: 100 each, Rfl: 3   glucose monitoring kit (FREESTYLE) monitoring kit, 1 each by Does not apply route as needed., Disp: 1 each, Rfl: 0  Lancets (FREESTYLE) lancets, 1 each by Other route as needed. Use as instructed, Disp: 100 each, Rfl: 3   methocarbamol (ROBAXIN) 500 MG tablet, Take 1 tablet (500 mg total) by mouth every 6 (six) hours as needed for muscle spasms., Disp: 40 tablet, Rfl: 0   Multiple Vitamin (MULTIVITAMIN WITH MINERALS) TABS tablet, Take 1 tablet by mouth daily., Disp: , Rfl:    Oxymetazoline HCl (NASAL SPRAY) 0.05 % SOLN, Place into the nose., Disp: ,  Rfl:    pantoprazole (PROTONIX) 40 MG tablet, Take 1 tablet (40 mg total) by mouth 2 (two) times daily., Disp: 180 tablet, Rfl: 0   rivaroxaban (XARELTO) 20 MG TABS tablet, Take 1 tablet (20 mg total) by mouth daily with supper., Disp: 90 tablet, Rfl: 1   rosuvastatin (CRESTOR) 10 MG tablet, Take 1 tablet (10 mg total) by mouth daily., Disp: 90 tablet, Rfl: 1   sacubitril-valsartan (ENTRESTO) 24-26 MG, Take 1 tablet by mouth 2 (two) times daily., Disp: 180 tablet, Rfl: 3   TALTZ 80 MG/ML SOAJ, Inject 1 ml ('80mg'$ ) under the skin every 4 weeks., Disp: 1 mL, Rfl: 10   tirzepatide (MOUNJARO) 5 MG/0.5ML Pen, Inject 5 mg into the skin once a week., Disp: 6 mL, Rfl: 1   Vitamin D, Cholecalciferol, 1000 units CAPS, Take 1,000 Units by mouth daily. , Disp: , Rfl:    Allergies: Allergies  Allergen Reactions   Morphine And Related Other (See Comments)    Hallucinations    Review of Systems: ROS   Objective:   Vitals: There were no vitals filed for this visit.  Physical Exam: Physical Exam   Data: Labs, imaging, and micro were reviewed in Epic. Refer to Assessment and Plan below for full details in Problem-Based Charting.  Assessment & Plan:  No problem-specific Assessment & Plan notes found for this encounter.     Patient will follow up in ***  Lottie Mussel, MD

## 2022-10-07 ENCOUNTER — Ambulatory Visit (INDEPENDENT_AMBULATORY_CARE_PROVIDER_SITE_OTHER): Payer: 59 | Admitting: Internal Medicine

## 2022-10-07 ENCOUNTER — Encounter: Payer: Self-pay | Admitting: Internal Medicine

## 2022-10-07 VITALS — BP 100/78 | HR 85 | Temp 97.5°F | Wt 269.3 lb

## 2022-10-07 DIAGNOSIS — K219 Gastro-esophageal reflux disease without esophagitis: Secondary | ICD-10-CM | POA: Diagnosis not present

## 2022-10-07 DIAGNOSIS — Z794 Long term (current) use of insulin: Secondary | ICD-10-CM

## 2022-10-07 DIAGNOSIS — Z8673 Personal history of transient ischemic attack (TIA), and cerebral infarction without residual deficits: Secondary | ICD-10-CM | POA: Diagnosis not present

## 2022-10-07 DIAGNOSIS — Z7901 Long term (current) use of anticoagulants: Secondary | ICD-10-CM | POA: Diagnosis not present

## 2022-10-07 DIAGNOSIS — G4733 Obstructive sleep apnea (adult) (pediatric): Secondary | ICD-10-CM | POA: Diagnosis not present

## 2022-10-07 DIAGNOSIS — M542 Cervicalgia: Secondary | ICD-10-CM

## 2022-10-07 DIAGNOSIS — M00061 Staphylococcal arthritis, right knee: Secondary | ICD-10-CM | POA: Insufficient documentation

## 2022-10-07 DIAGNOSIS — S1980XS Other specified injuries of unspecified part of neck, sequela: Secondary | ICD-10-CM | POA: Insufficient documentation

## 2022-10-07 DIAGNOSIS — D45 Polycythemia vera: Secondary | ICD-10-CM

## 2022-10-07 DIAGNOSIS — L4050 Arthropathic psoriasis, unspecified: Secondary | ICD-10-CM

## 2022-10-07 DIAGNOSIS — I5032 Chronic diastolic (congestive) heart failure: Secondary | ICD-10-CM

## 2022-10-07 DIAGNOSIS — Z8619 Personal history of other infectious and parasitic diseases: Secondary | ICD-10-CM | POA: Insufficient documentation

## 2022-10-07 DIAGNOSIS — I4821 Permanent atrial fibrillation: Secondary | ICD-10-CM

## 2022-10-07 DIAGNOSIS — E119 Type 2 diabetes mellitus without complications: Secondary | ICD-10-CM

## 2022-10-07 LAB — POCT GLYCOSYLATED HEMOGLOBIN (HGB A1C): Hemoglobin A1C: 6 % — AB (ref 4.0–5.6)

## 2022-10-07 LAB — GLUCOSE, CAPILLARY: Glucose-Capillary: 124 mg/dL — ABNORMAL HIGH (ref 70–99)

## 2022-10-07 MED ORDER — PANTOPRAZOLE SODIUM 40 MG PO TBEC: 40.0000 mg | DELAYED_RELEASE_TABLET | Freq: Every day | ORAL | 0 refills | Status: DC

## 2022-10-07 NOTE — Assessment & Plan Note (Signed)
-   This problem is chronic and stable - Continue Pantoprazole '40mg'$  daily

## 2022-10-07 NOTE — Assessment & Plan Note (Signed)
-   This problem is chronic and stable - Continue Xarelto '20mg'$  daily and Carvedilol '25mg'$  BID

## 2022-10-07 NOTE — Assessment & Plan Note (Addendum)
-   Problem is chronic and stable - Continue Taltz (ixekizumab) injections, sent to [facility]

## 2022-10-07 NOTE — Assessment & Plan Note (Signed)
-   Continue BIPAP 11/8, PS 2 - Managed by Dr. Baird Lyons, Pulmonology - This problem is chronic and stable. No changes made today.

## 2022-10-07 NOTE — Assessment & Plan Note (Signed)
-   Since pain has not improved, I agree with the plan to return to Orthopedics clinic - Continue Tylenol as needed - Continue stretches from physical therapy  - Avoid NSAIDS given comorbidities

## 2022-10-07 NOTE — Assessment & Plan Note (Signed)
-   Patient is due for CBC. If Hct >50%, we will alert his Hematologist, Dr. Julien Nordmann, for therapeutic phlebotomy

## 2022-10-07 NOTE — Patient Instructions (Signed)
Dear [name],  It was a pleasure seeing you in clinic today.  I did not make any changes to your medicines.  If your neck pain continues to bother you, I recommend that you call the [place] office back for further assessment.   I'll see you back in 3 months, and we will order your screening colonoscopy at that appointment.  I am checking some blood work and a urine sample today - I will call you with these results by early next week at the latest.  Please call us if you have any other questions or concerns.  Sincerely, Dr. Lottie Mussel

## 2022-10-07 NOTE — Assessment & Plan Note (Signed)
-   Problem is chronic and stable - Continue Rosuvastatin '10mg'$  daily - Lipid panel today

## 2022-10-07 NOTE — Assessment & Plan Note (Signed)
-   A1C 6.0 today - Continue Jardiance '10mg'$  daily & Mounjaro '5mg'$  weekly - Foot exam performed today - Urine MACR checked today

## 2022-10-08 LAB — LIPID PANEL

## 2022-10-09 LAB — CBC
Hematocrit: 53.8 % — ABNORMAL HIGH (ref 37.5–51.0)
Hemoglobin: 18 g/dL — ABNORMAL HIGH (ref 13.0–17.7)
MCH: 27.5 pg (ref 26.6–33.0)
MCHC: 33.5 g/dL (ref 31.5–35.7)
MCV: 82 fL (ref 79–97)
Platelets: 175 10*3/uL (ref 150–450)
RBC: 6.54 x10E6/uL — ABNORMAL HIGH (ref 4.14–5.80)
RDW: 18 % — ABNORMAL HIGH (ref 11.6–15.4)
WBC: 7.3 10*3/uL (ref 3.4–10.8)

## 2022-10-09 LAB — LIPID PANEL
Chol/HDL Ratio: 2.7 ratio (ref 0.0–5.0)
Cholesterol, Total: 99 mg/dL — ABNORMAL LOW (ref 100–199)
HDL: 37 mg/dL — ABNORMAL LOW (ref >39)
LDL Chol Calc (NIH): 37 mg/dL (ref 0–99)
Triglycerides: 146 mg/dL (ref 0–149)
VLDL Cholesterol Cal: 25 mg/dL (ref 5–40)

## 2022-10-09 LAB — MICROALBUMIN / CREATININE URINE RATIO
Creatinine, Urine: 84 mg/dL
Microalb/Creat Ratio: 110 mg/g creat — ABNORMAL HIGH (ref 0–29)
Microalbumin, Urine: 92.3 ug/mL

## 2022-10-12 ENCOUNTER — Telehealth: Payer: Self-pay

## 2022-10-12 ENCOUNTER — Ambulatory Visit: Payer: 59 | Admitting: Internal Medicine

## 2022-10-12 NOTE — Progress Notes (Addendum)
I called patient to discuss labs.  Hgb elevated, so I will recommend that patient gets repeat phlebotomy again. I messaged Dr. Arbutus Ped to inquire if he needs an appointment or if he can just show up to [facility] long ot get this done.  Urine microalbumin mildly elevated - can consider increasing Jardiance or Entresto at next visit if there is blood pressure room, but he was 100/78 in office, so no changes for now.  Lipids are stable. He was fasting.  All questions answered.

## 2022-10-12 NOTE — Telephone Encounter (Signed)
Requesting to speak with Dr. Cain Sieve. Please call pt back.

## 2022-10-13 ENCOUNTER — Other Ambulatory Visit: Payer: Self-pay | Admitting: Internal Medicine

## 2022-10-15 ENCOUNTER — Encounter: Payer: Self-pay | Admitting: Internal Medicine

## 2022-10-15 ENCOUNTER — Ambulatory Visit: Payer: 59 | Admitting: Internal Medicine

## 2022-10-15 ENCOUNTER — Other Ambulatory Visit (HOSPITAL_COMMUNITY): Payer: Self-pay

## 2022-10-15 ENCOUNTER — Other Ambulatory Visit: Payer: Self-pay

## 2022-10-15 VITALS — BP 113/78 | HR 63 | Resp 16 | Ht 70.0 in | Wt 267.0 lb

## 2022-10-15 DIAGNOSIS — L409 Psoriasis, unspecified: Secondary | ICD-10-CM | POA: Diagnosis not present

## 2022-10-15 DIAGNOSIS — T8453XD Infection and inflammatory reaction due to internal right knee prosthesis, subsequent encounter: Secondary | ICD-10-CM | POA: Diagnosis not present

## 2022-10-15 DIAGNOSIS — Z96659 Presence of unspecified artificial knee joint: Secondary | ICD-10-CM

## 2022-10-15 MED ORDER — CEFADROXIL 500 MG PO CAPS
1.0000 g | ORAL_CAPSULE | Freq: Two times a day (BID) | ORAL | 11 refills | Status: DC
  Filled 2022-10-15 – 2022-10-23 (×4): qty 120, 30d supply, fill #0
  Filled 2022-11-20: qty 120, 30d supply, fill #1
  Filled 2022-12-22: qty 120, 30d supply, fill #2
  Filled 2023-01-20: qty 120, 30d supply, fill #3
  Filled 2023-02-25: qty 120, 30d supply, fill #4
  Filled 2023-03-23: qty 120, 30d supply, fill #5
  Filled 2023-04-23: qty 120, 30d supply, fill #6
  Filled 2023-05-23: qty 120, 30d supply, fill #7
  Filled 2023-07-04: qty 120, 30d supply, fill #8
  Filled 2023-07-31: qty 120, 30d supply, fill #9
  Filled 2023-08-30: qty 120, 30d supply, fill #10

## 2022-10-15 NOTE — Progress Notes (Signed)
Patient ID: [name], male   DOB: June 05, 1961, 62 y.o.   MRN: [id number]  HPI [name] is a 62yoM with hx of psoriasis, T2DM, HTN/HLD, who had complicated MSSA pji of right knee, requiring evacuation of hematoma. He wasLast seen in November 2023 with the plan to continue with suppression. will change to cefadroxil. Stop doxy due to Hyperpigmentation = possibly due to doxy. He is tolerating cefadroxil however he is taking 4 pills per day.  Outpatient Encounter Medications as of 10/15/2022  Medication Sig   acetaminophen (TYLENOL) 500 MG tablet Take 500 mg by mouth every 6 (six) hours as needed.   carvedilol (COREG) 25 MG tablet Take 1 tablet (25 mg total) by mouth 2 (two) times daily.   cefadroxil (DURICEF) 500 MG capsule Take 2 capsules (1,000 mg total) by mouth 2 (two) times daily.   clobetasol cream (TEMOVATE) 0.05 % APPLY TOPICALLY TO AFFECTED AREA TWICE DAILY.   empagliflozin (JARDIANCE) 10 MG TABS tablet Take 1 tablet (10 mg total) by mouth daily.   methocarbamol (ROBAXIN) 500 MG tablet Take 1 tablet (500 mg total) by mouth every 6 (six) hours as needed for muscle spasms.   Multiple Vitamin (MULTIVITAMIN WITH MINERALS) TABS tablet Take 1 tablet by mouth daily.   Oxymetazoline HCl (NASAL SPRAY) 0.05 % SOLN Place into the nose.   pantoprazole (PROTONIX) 40 MG tablet Take 1 tablet (40 mg total) by mouth daily.   rivaroxaban (XARELTO) 20 MG TABS tablet Take 1 tablet (20 mg total) by mouth daily with supper.   rosuvastatin (CRESTOR) 10 MG tablet Take 1 tablet (10 mg total) by mouth daily.   sacubitril-valsartan (ENTRESTO) 24-26 MG Take 1 tablet by mouth 2 (two) times daily.   TALTZ 80 MG/ML SOAJ Inject 1 ml (85m) under the skin every 4 weeks.   tirzepatide ([facility] 5 MG/0.5ML Pen Inject 5 mg into the skin once a week.   Vitamin D, Cholecalciferol, 1000 units CAPS Take 1,000 Units by mouth daily.    No facility-administered encounter medications on file as of 10/15/2022.      Patient Active Problem List   Diagnosis Date Noted   Staphylococcal arthritis, right knee ([place] 10/07/2022   Neck pain on left side 10/07/2022   Type 2 diabetes mellitus ([place] 10/06/2022   (HFpEF) heart failure with preserved ejection fraction ([place] 10/06/2022   Tubular adenoma of colon 10/18/2017   Diabetic nephropathy associated with type 2 diabetes mellitus ([place] 10/13/2016   Permanent atrial fibrillation ([place] 10/13/2016   Polycythemia vera ([place] 10/09/2014   Essential hypertension 09/17/2014   History of transient ischemic attack 09/05/2012   Gastroesophageal reflux disease 08/17/2011   Obesity (BMI 30-39.9) 08/17/2011   Psoriatic arthritis ([place] 08/17/2011   Obstructive sleep apnea syndrome 08/05/2009     Health Maintenance Due  Topic Date Due   HIV Screening  Never done   COVID-19 Vaccine (5 - 2023-24 season) 05/01/2022     Review of Systems 12 point ros is negative Physical Exam   Constitutional: He is oriented to person, place, and time. He appears well-developed and well-nourished. No distress.  HENT:  Mouth/Throat: Oropharynx is clear and moist. No oropharyngeal exudate.  [id number] incision is clean, dry, intact.   Neurological: He is alert and oriented to person, place, and time.  Skin: Skin is warm and dry. No rash noted. No erythema.  Psychiatric: He has a normal mood and affect. His behavior is normal.    CBC Lab Results  Component Value  Date   WBC 7.3 10/07/2022   RBC 6.54 (H) 10/07/2022   HGB 18.0 (H) 10/07/2022   HCT 53.8 (H) 10/07/2022   PLT 175 10/07/2022   MCV 82 10/07/2022   MCH 27.5 10/07/2022   MCHC 33.5 10/07/2022   RDW 18.0 (H) 10/07/2022   LYMPHSABS 2.0 04/14/2022   MONOABS 0.6 04/14/2022   EOSABS 0.1 04/14/2022    BMET Lab Results  Component Value Date   NA 134 (L) 04/14/2022   K 3.8 04/14/2022   CL 107 04/14/2022   CO2 19 (L) 04/14/2022   GLUCOSE 130 (H) 04/14/2022   BUN 22 04/14/2022   CREATININE 1.18 04/14/2022    CALCIUM 9.5 04/14/2022   GFRNONAA >60 04/14/2022   GFRAA 74 05/27/2020    Lab Results  Component Value Date   ESRSEDRATE 2 07/13/2022   Lab Results  Component Value Date   CRP 0.7 07/13/2022     Assessment and Plan  Will change up his prescription to cefadroxil 1030m po BID distributed in 2 pills per day rather than 4 to decrease pill burden  Appears still doing well with suppression. Will watch the too small patches on incision, unclear if psoriasis vs. Suture coming to surface. No surrounding erythema.  Will see back in 4 months

## 2022-10-16 ENCOUNTER — Other Ambulatory Visit (HOSPITAL_COMMUNITY): Payer: Self-pay

## 2022-10-19 ENCOUNTER — Other Ambulatory Visit (HOSPITAL_COMMUNITY): Payer: Self-pay

## 2022-10-20 ENCOUNTER — Ambulatory Visit: Payer: 59 | Admitting: Orthopedic Surgery

## 2022-10-20 ENCOUNTER — Other Ambulatory Visit (HOSPITAL_COMMUNITY): Payer: Self-pay

## 2022-10-20 DIAGNOSIS — M542 Cervicalgia: Secondary | ICD-10-CM
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
## 2022-10-21 ENCOUNTER — Other Ambulatory Visit: Payer: Self-pay

## 2022-10-21 ENCOUNTER — Encounter: Payer: Self-pay | Admitting: Orthopedic Surgery

## 2022-10-21 ENCOUNTER — Encounter: Payer: Self-pay | Admitting: Internal Medicine

## 2022-10-21 DIAGNOSIS — M47812 Spondylosis without myelopathy or radiculopathy, cervical region: Secondary | ICD-10-CM

## 2022-10-22 ENCOUNTER — Other Ambulatory Visit (HOSPITAL_COMMUNITY): Payer: Self-pay

## 2022-10-22 ENCOUNTER — Inpatient Hospital Stay: Payer: 59

## 2022-10-22 VITALS — BP 117/88 | HR 40 | Temp 98.9°F | Resp 16

## 2022-10-22 DIAGNOSIS — D45 Polycythemia vera: Secondary | ICD-10-CM

## 2022-10-22 NOTE — Progress Notes (Signed)
[name] presents today for phlebotomy per MD orders. Phlebotomy procedure to RAC with secondary set started at 1515 and ended at 1542. 523 cc removed. Patient tolerated procedure well. Beverage provided. IV needle removed intact.

## 2022-10-22 NOTE — Patient Instructions (Signed)

## 2022-10-23 ENCOUNTER — Other Ambulatory Visit (HOSPITAL_COMMUNITY): Payer: Self-pay

## 2022-11-03 ENCOUNTER — Ambulatory Visit
Admission: RE | Admit: 2022-11-03 | Discharge: 2022-11-03 | Disposition: A | Discharge status: Home / Self Care | Payer: 59 | Source: Ambulatory Visit | Attending: Orthopedic Surgery | Admitting: Orthopedic Surgery

## 2022-11-03 DIAGNOSIS — M50123 Cervical disc disorder at C6-C7 level with radiculopathy: Secondary | ICD-10-CM | POA: Diagnosis not present

## 2022-11-03 DIAGNOSIS — M4722 Other spondylosis with radiculopathy, cervical region: Secondary | ICD-10-CM | POA: Diagnosis not present

## 2022-11-03 DIAGNOSIS — M542 Cervicalgia: Secondary | ICD-10-CM

## 2022-11-03 DIAGNOSIS — M4802 Spinal stenosis, cervical region: Secondary | ICD-10-CM | POA: Diagnosis not present

## 2022-11-10 NOTE — Progress Notes (Signed)
[facility]: Clinic Note  Subjective:  History of Present Illness: [name]. is a 62 y.o. year old male who presents for an acute visit for dizziness.  '[ ]'$  orthostatics  acute prolonged severe vertigo (eg, vestibular neuronitis, stroke), recurrent spontaneous attacks (eg, Meniere disease, vestibular migraine), recurrent positionally triggered attacks (benign paroxysmal positional vertigo), and chronic persistent dizziness (eg, psychogenic, cerebellar ataxia)  Is it prolonged or does it come and go? Ringing in ears? Have you fallen? Can you walk? Any weakness, difficulty speaking or difficulty swallowing? Vomiting? Full neuro exam  Bmp & cbc?     Please refer to Assessment and Plan below for full details in Problem-Based Charting.   Past Medical History:  Patient Active Problem List   Diagnosis Date Noted   Staphylococcal arthritis, right knee ([place]) 10/07/2022   Neck pain on left side 10/07/2022   Type 2 diabetes mellitus ([place]) 10/06/2022   (HFpEF) heart failure with preserved ejection fraction ([place]) 10/06/2022   Tubular adenoma of colon 10/18/2017   Diabetic nephropathy associated with type 2 diabetes mellitus ([place]) 10/13/2016   Permanent atrial fibrillation ([place]) 10/13/2016   Polycythemia vera ([place]) 10/09/2014   Essential hypertension 09/17/2014   History of transient ischemic attack 09/05/2012   Gastroesophageal reflux disease 08/17/2011   Obesity (BMI 30-39.9) 08/17/2011   Psoriatic arthritis ([place]) 08/17/2011   Obstructive sleep apnea syndrome 08/05/2009    Social History: Reviewed in [place]. Pertinent updates today include: ***  Family History: Reviewed in Epic. Pertinent updates today include: None  Medications:  Current Outpatient Medications:    acetaminophen (TYLENOL) 500 MG tablet, Take 500 mg by mouth every 6 (six) hours as needed., Disp: , Rfl:    carvedilol (COREG) 25 MG tablet, Take 1 tablet (25 mg total) by mouth 2 (two)  times daily., Disp: 60 tablet, Rfl: 9   cefadroxil (DURICEF) 500 MG capsule, Take 2 capsules (1,000 mg total) by mouth 2 (two) times daily., Disp: 120 capsule, Rfl: 11   clobetasol cream (TEMOVATE) 0.05 %, APPLY TOPICALLY TO AFFECTED AREA TWICE DAILY., Disp: 30 g, Rfl: 0   empagliflozin (JARDIANCE) 10 MG TABS tablet, Take 1 tablet (10 mg total) by mouth daily., Disp: 90 tablet, Rfl: 1   methocarbamol (ROBAXIN) 500 MG tablet, Take 1 tablet (500 mg total) by mouth every 6 (six) hours as needed for muscle spasms., Disp: 40 tablet, Rfl: 0   Multiple Vitamin (MULTIVITAMIN WITH MINERALS) TABS tablet, Take 1 tablet by mouth daily., Disp: , Rfl:    Oxymetazoline HCl (NASAL SPRAY) 0.05 % SOLN, Place into the nose., Disp: , Rfl:    pantoprazole (PROTONIX) 40 MG tablet, Take 1 tablet (40 mg total) by mouth daily., Disp: 180 tablet, Rfl: 0   rivaroxaban (XARELTO) 20 MG TABS tablet, Take 1 tablet (20 mg total) by mouth daily with supper., Disp: 90 tablet, Rfl: 1   rosuvastatin (CRESTOR) 10 MG tablet, Take 1 tablet (10 mg total) by mouth daily., Disp: 90 tablet, Rfl: 1   sacubitril-valsartan (ENTRESTO) 24-26 MG, Take 1 tablet by mouth 2 (two) times daily., Disp: 180 tablet, Rfl: 3   TALTZ 80 MG/ML SOAJ, Inject 1 ml ('80mg'$ ) under the skin every 4 weeks., Disp: 1 mL, Rfl: 10   tirzepatide (MOUNJARO) 5 MG/0.5ML Pen, Inject 5 mg into the skin once a week., Disp: 6 mL, Rfl: 1   Vitamin D, Cholecalciferol, 1000 units CAPS, Take 1,000 Units by mouth daily. , Disp: , Rfl:    Allergies: Allergies  Allergen Reactions   Morphine And Related Other (See Comments)    Hallucinations    Review of Systems: ROS   Objective:   Vitals: There were no vitals filed for this visit.  Physical Exam: Physical Exam   Data: Labs, imaging, and micro were reviewed in Epic. Refer to Assessment and Plan below for full details in Problem-Based Charting.  Assessment & Plan:  No problem-specific Assessment & Plan notes found for  this encounter.     Patient will follow up in ***  Lottie Mussel, MD

## 2022-11-11 ENCOUNTER — Ambulatory Visit (INDEPENDENT_AMBULATORY_CARE_PROVIDER_SITE_OTHER): Payer: 59 | Admitting: Internal Medicine

## 2022-11-11 ENCOUNTER — Encounter: Payer: Self-pay | Admitting: Internal Medicine

## 2022-11-11 VITALS — BP 102/65 | HR 73 | Temp 97.7°F | Ht 70.0 in | Wt 269.4 lb

## 2022-11-11 DIAGNOSIS — Z79899 Other long term (current) drug therapy: Secondary | ICD-10-CM

## 2022-11-11 DIAGNOSIS — R42 Dizziness and giddiness: Secondary | ICD-10-CM | POA: Diagnosis not present

## 2022-11-11 HISTORY — DX: Dizziness and giddiness: R42

## 2022-11-11 LAB — GLUCOSE, CAPILLARY: Glucose-Capillary: 123 mg/dL — ABNORMAL HIGH (ref 70–99)

## 2022-11-11 MED ORDER — CARVEDILOL 12.5 MG PO TABS: 12.5000 mg | ORAL_TABLET | Freq: Two times a day (BID) | ORAL | 3 refills | Status: DC

## 2022-11-11 NOTE — Telephone Encounter (Signed)
Orthopedic Telephone Call  Discussed the patient's MRI with him today.  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  I discussed the fact that some people with central stenosis can develop something called cervical myelopathy.  This can cause symptoms of decreased hand dexterity, unsteadiness with gait, urinary/bowel incontinence.  Patient can also developed burning hand pain or radiating numbness or paresthesias in the arms.  He has not developed any of the symptoms and I told him on his exams he did not have findings concerning for myelopathy.  I informed him that we should continue to monitor in case he does develop symptoms because myelopathy tends to be a progressive problem where patients developed stepwise decline in function.  I also let him know that his MRI showed facet arthropathy with edema particularly on the left side at C2/3.  I told him that I suspect that this is his etiology of pain since that can cause neck pain.  He also has foraminal stenosis but he is not having radicular type pain so I do not think he is symptomatic from that.  I told him I would put in referral for an injection to the C2-3 facets to see if that gives him some relief.  I want to see him back after those injections to see how he does.  I will also follow him to check in periodically to see if he develops symptoms or signs of cervical myelopathy.  Patient expressed understanding of the plan and agreed to proceed with the recommendation.  Callie Fielding, MD Orthopedic Surgeon

## 2022-11-11 NOTE — Assessment & Plan Note (Addendum)
-   Right now, I think the most likely cause of [name]'s symptoms is his bradycardia. He has permanent Afib, and is on a high dose of carvedilol 25mg  BID. The medical assistant told me that he had some dizziness when doing the sitting portion of orthostatics, and his HR was 55 at this time. I think this fits with his general picture of feeling "off" and fatigued. We will decrease carvedilol to 12.5mg  BID, and I'll message his cardiologist to see if he wants to set up a monitor.  - I don't think this fits with BPPV, especially with negative Dix Halpike maneuver. I did consider whether [name] could have had a stroke, and we talked through this possibility together, but I don't think this fits his normal neurologic exam and generalized symptoms that come and go.  - CBC & BMP today

## 2022-11-11 NOTE — Assessment & Plan Note (Signed)
-   [name] is taking a lot of medicines and has the potential to be experiencing negative side effects from these, but I don't think his longstanding medicines explain the acute change on Sunday - I only want to make 1 change at a time, so we'll continue current dose of Mounjaro and Rosuvastatin for now. At future visits, we can discuss the risks/benefits of decreasing the dose of these or weaning off

## 2022-11-11 NOTE — Patient Instructions (Signed)
Dear [name],  It was a pleasure seeing you today.  Please cut your carvedilol in half to take 12.'5mg'$  twice daily.  I'll work you in sometime next week, so we'll call you with your labs and with the time of this appointment by Monday at the latest.    If you develop any stroke symptoms like we discussed, please go to the emergency room.    Sincerely Dr. Lottie Mussel

## 2022-11-12 ENCOUNTER — Encounter: Payer: Self-pay | Admitting: Gastroenterology

## 2022-11-12 ENCOUNTER — Other Ambulatory Visit (HOSPITAL_COMMUNITY): Payer: Self-pay

## 2022-11-12 LAB — CBC
Hematocrit: 52.2 % — ABNORMAL HIGH (ref 37.5–51.0)
Hemoglobin: 17 g/dL (ref 13.0–17.7)
MCH: 26.9 pg (ref 26.6–33.0)
MCHC: 32.6 g/dL (ref 31.5–35.7)
MCV: 83 fL (ref 79–97)
Platelets: 196 10*3/uL (ref 150–450)
RBC: 6.33 x10E6/uL — ABNORMAL HIGH (ref 4.14–5.80)
RDW: 17 % — ABNORMAL HIGH (ref 11.6–15.4)
WBC: 7.3 10*3/uL (ref 3.4–10.8)

## 2022-11-12 LAB — BMP8+ANION GAP
Anion Gap: 17 mmol/L (ref 10.0–18.0)
BUN/Creatinine Ratio: 11 (ref 10–24)
BUN: 13 mg/dL (ref 8–27)
CO2: 19 mmol/L — ABNORMAL LOW (ref 20–29)
Calcium: 9.3 mg/dL (ref 8.6–10.2)
Chloride: 102 mmol/L (ref 96–106)
Creatinine, Ser: 1.14 mg/dL (ref 0.76–1.27)
Glucose: 112 mg/dL — ABNORMAL HIGH (ref 70–99)
Potassium: 4.2 mmol/L (ref 3.5–5.2)
Sodium: 138 mmol/L (ref 134–144)
eGFR: 72 mL/min/{1.73_m2} (ref >59)

## 2022-11-13 ENCOUNTER — Encounter: Payer: Self-pay | Admitting: Internal Medicine

## 2022-11-13 ENCOUNTER — Other Ambulatory Visit (HOSPITAL_COMMUNITY): Payer: Self-pay

## 2022-11-13 ENCOUNTER — Other Ambulatory Visit: Payer: Self-pay

## 2022-11-17 NOTE — Progress Notes (Signed)
I talked to [name] to review his labs.  He is feeling 100% better with the Carvedilol dose cut in half - no more episodes of dizziness or fatigue. We don't know if he had a viral infection that got better or if the culprit was too much beta blocker, but either way, we're happy the symptoms are gone. And with his HR of 55 at last appointment, I think we should keep the coreg at 12.5mg  BID for now.   I had scheduled him for an appointment tomorrow, but since he's feeling better, we both agreed to cancel that, and I'll see him on 5/1. I messaged front desk team to cancel appointment.  We reviewed labs too - everything is stable. He has another phlebotomy appointment scheduled.

## 2022-11-18 ENCOUNTER — Encounter: Payer: 59 | Admitting: Internal Medicine

## 2022-11-19 ENCOUNTER — Telehealth: Payer: Self-pay | Admitting: Physical Medicine and Rehabilitation

## 2022-11-19 NOTE — Telephone Encounter (Signed)
Patient returned call asked for a call back to schedule an appointment. The number to contact patient is [phone]

## 2022-11-20 ENCOUNTER — Other Ambulatory Visit: Payer: Self-pay

## 2022-11-20 NOTE — Telephone Encounter (Signed)
LVM to return call to schedule injection 

## 2022-11-26 ENCOUNTER — Telehealth: Payer: Self-pay

## 2022-11-26 NOTE — Telephone Encounter (Signed)
Pt lvm stating he is returning call to schedule his injection. Please cb

## 2022-11-26 NOTE — Telephone Encounter (Signed)
Patient notified

## 2022-12-05 ENCOUNTER — Other Ambulatory Visit: Payer: Self-pay | Admitting: Family Medicine

## 2022-12-05 DIAGNOSIS — E1121 Type 2 diabetes mellitus with diabetic nephropathy: Secondary | ICD-10-CM

## 2022-12-07 ENCOUNTER — Other Ambulatory Visit (HOSPITAL_COMMUNITY): Payer: Self-pay

## 2022-12-09 ENCOUNTER — Other Ambulatory Visit (HOSPITAL_COMMUNITY): Payer: Self-pay

## 2022-12-09 ENCOUNTER — Other Ambulatory Visit: Payer: Self-pay | Admitting: Family Medicine

## 2022-12-09 ENCOUNTER — Ambulatory Visit: Payer: 59 | Admitting: Physical Medicine and Rehabilitation

## 2022-12-09 ENCOUNTER — Other Ambulatory Visit: Payer: Self-pay

## 2022-12-09 VITALS — BP 129/86 | HR 64

## 2022-12-09 DIAGNOSIS — M47812 Spondylosis without myelopathy or radiculopathy, cervical region: Secondary | ICD-10-CM

## 2022-12-09 DIAGNOSIS — E1121 Type 2 diabetes mellitus with diabetic nephropathy: Secondary | ICD-10-CM

## 2022-12-09 MED ORDER — METHYLPREDNISOLONE ACETATE 80 MG/ML IJ SUSP
80.0000 mg | Freq: Once | INTRAMUSCULAR | Status: AC
  Administered 2022-12-09: 80 mg

## 2022-12-09 NOTE — Patient Instructions (Signed)

## 2022-12-09 NOTE — Progress Notes (Signed)
Functional Pain Scale - descriptive words and definitions  Distracting (5)    Aware of pain/able to complete some ADL's but limited by pain/sleep is affected and active distractions are only slightly useful. Moderate range order  Average Pain 3   +Driver, -BT, -Dye Allergies.  Neck pain on both sides

## 2022-12-11 ENCOUNTER — Other Ambulatory Visit (HOSPITAL_COMMUNITY): Payer: Self-pay

## 2022-12-11 ENCOUNTER — Encounter: Payer: Self-pay | Admitting: Internal Medicine

## 2022-12-14 ENCOUNTER — Other Ambulatory Visit: Payer: Self-pay

## 2022-12-14 ENCOUNTER — Other Ambulatory Visit (HOSPITAL_COMMUNITY): Payer: Self-pay

## 2022-12-15 ENCOUNTER — Other Ambulatory Visit: Payer: Self-pay

## 2022-12-15 ENCOUNTER — Other Ambulatory Visit: Payer: Self-pay | Admitting: Internal Medicine

## 2022-12-15 ENCOUNTER — Other Ambulatory Visit (HOSPITAL_COMMUNITY): Payer: Self-pay

## 2022-12-15 DIAGNOSIS — E1121 Type 2 diabetes mellitus with diabetic nephropathy: Secondary | ICD-10-CM

## 2022-12-15 DIAGNOSIS — G4733 Obstructive sleep apnea (adult) (pediatric): Secondary | ICD-10-CM | POA: Diagnosis not present

## 2022-12-15 MED ORDER — TIRZEPATIDE 5 MG/0.5ML ~~LOC~~ SOAJ
5.0000 mg | SUBCUTANEOUS | 1 refills | Status: DC
  Filled 2022-12-15: qty 6, 84d supply, fill #0
  Filled 2023-01-04: qty 6, 84d supply, fill #1
  Filled 2023-01-06: qty 2, 28d supply, fill #1
  Filled 2023-02-28: qty 6, 84d supply, fill #1

## 2022-12-21 DIAGNOSIS — G4733 Obstructive sleep apnea (adult) (pediatric): Secondary | ICD-10-CM | POA: Diagnosis not present

## 2022-12-22 ENCOUNTER — Other Ambulatory Visit: Payer: Self-pay | Admitting: Cardiovascular Disease

## 2022-12-22 ENCOUNTER — Other Ambulatory Visit: Payer: Self-pay | Admitting: Family Medicine

## 2022-12-22 DIAGNOSIS — E1169 Type 2 diabetes mellitus with other specified complication: Secondary | ICD-10-CM

## 2022-12-23 ENCOUNTER — Other Ambulatory Visit: Payer: Self-pay

## 2022-12-23 ENCOUNTER — Other Ambulatory Visit (HOSPITAL_COMMUNITY): Payer: Self-pay

## 2022-12-23 MED ORDER — ENTRESTO 24-26 MG PO TABS
1.0000 | ORAL_TABLET | Freq: Two times a day (BID) | ORAL | 2 refills | Status: DC
  Filled 2022-12-23: qty 180, 90d supply, fill #0
  Filled 2023-03-19: qty 180, 90d supply, fill #1
  Filled 2023-06-17: qty 180, 90d supply, fill #2

## 2022-12-24 ENCOUNTER — Other Ambulatory Visit (HOSPITAL_COMMUNITY): Payer: Self-pay

## 2022-12-25 ENCOUNTER — Other Ambulatory Visit (HOSPITAL_COMMUNITY): Payer: Self-pay

## 2022-12-25 DIAGNOSIS — D1801 Hemangioma of skin and subcutaneous tissue: Secondary | ICD-10-CM | POA: Diagnosis not present

## 2022-12-25 DIAGNOSIS — D2262 Melanocytic nevi of left upper limb, including shoulder: Secondary | ICD-10-CM | POA: Diagnosis not present

## 2022-12-25 DIAGNOSIS — Z85828 Personal history of other malignant neoplasm of skin: Secondary | ICD-10-CM | POA: Diagnosis not present

## 2022-12-25 DIAGNOSIS — L400 Psoriasis vulgaris: Secondary | ICD-10-CM | POA: Diagnosis not present

## 2022-12-25 DIAGNOSIS — D225 Melanocytic nevi of trunk: Secondary | ICD-10-CM | POA: Diagnosis not present

## 2022-12-25 DIAGNOSIS — D2261 Melanocytic nevi of right upper limb, including shoulder: Secondary | ICD-10-CM | POA: Diagnosis not present

## 2022-12-25 DIAGNOSIS — L281 Prurigo nodularis: Secondary | ICD-10-CM | POA: Diagnosis not present

## 2022-12-25 DIAGNOSIS — L814 Other melanin hyperpigmentation: Secondary | ICD-10-CM | POA: Diagnosis not present

## 2022-12-25 NOTE — Procedures (Signed)
Cervical Facet Joint Intra-Articular Injection with Fluoroscopic Guidance  Patient: [name].      Date of Birth: [birth year] MRN: [id number] PCP: Mercie Eon, MD      Visit Date: 12/09/2022   Universal Protocol:    Date/Time: 04/26/243:47 PM  Consent Given By: the patient  Position: PRONE  Additional Comments: Vital signs were monitored before and after the procedure. Patient was prepped and draped in the usual sterile fashion. The correct patient, procedure, and site was verified.   Injection Procedure Details:  Procedure Site One Meds Administered:  Meds ordered this encounter  Medications   methylPREDNISolone acetate (DEPO-MEDROL) injection 80 mg     Laterality: Bilateral  Location/Site:  C2-3  Needle size: 25 G  Needle type: Spinal  Needle Placement: Articular  Findings:  -Contrast Used: 0.5 mL iohexol 180 mg iodine/mL   -Comments: Excellent flow of contrast producing a partial arthrogram.  Procedure Details: The region overlying the facet joints mentioned above were localized under fluoroscopic visualization. The needle was inserted down to the level of the lateral mass of the superior articular process of the facet joint to be injected. Then, the needle was "walked off" inferiorly into the lateral aspect of the facet joint. Bi-planar images were used for confirming placement and spot radiographs were documented.  A 0.25 ml volume of Omnipaque-240 was injected into the facet joint and a standard partial arthrogram was obtained. Radiographs were obtained of the arthrogram. A 0.5 ml. volume of the steroid/anesthetic solution was injected into the joint. This procedure was repeated for each facet joint injected.   Additional Comments:  No complications occurred Dressing: Band-Aid    Post-procedure details: Patient was observed during the procedure. Post-procedure instructions were reviewed.  Patient left the clinic in stable condition.

## 2022-12-25 NOTE — Progress Notes (Signed)
[name]. - 62 y.o. male MRN [id number]  Date of birth: 1961-01-18  Office Visit Note: Visit Date: 12/09/2022 PCP: Mercie Eon, MD Referred by: London Sheer, MD  Subjective: Chief Complaint  Patient presents with   Neck - Pain   HPI:  Ezio Wieck. is a 62 y.o. male who comes in today at the request of Ellin Goodie, FNP for planned Bilateral  C2-3 Cervical facet/medial branch block with fluoroscopic guidance.  The patient has failed conservative care including home exercise, medications, time and activity modification.  This injection will be diagnostic and hopefully therapeutic.  Please see requesting physician notes for further details and justification.  Exam has shown concordant pain with facet joint loading.   ROS Otherwise per HPI.  Assessment & Plan: Visit Diagnoses:    ICD-10-CM   1. Cervical spondylosis without myelopathy  M47.812 XR C-ARM NO REPORT    Facet Injection    methylPREDNISolone acetate (DEPO-MEDROL) injection 80 mg      Plan: No additional findings.   Meds & Orders:  Meds ordered this encounter  Medications   methylPREDNISolone acetate (DEPO-MEDROL) injection 80 mg    Orders Placed This Encounter  Procedures   Facet Injection   XR C-ARM NO REPORT    Follow-up: Return for visit to requesting provider as needed.   Procedures: No procedures performed  Cervical Facet Joint Intra-Articular Injection with Fluoroscopic Guidance  Patient: Verlin Uher.      Date of Birth: 07-14-1961 MRN: [id number] PCP: Mercie Eon, MD      Visit Date: 12/09/2022   Universal Protocol:    Date/Time: 04/26/243:47 PM  Consent Given By: the patient  Position: PRONE  Additional Comments: Vital signs were monitored before and after the procedure. Patient was prepped and draped in the usual sterile fashion. The correct patient, procedure, and site was verified.   Injection Procedure Details:  Procedure Site One Meds Administered:   Meds ordered this encounter  Medications   methylPREDNISolone acetate (DEPO-MEDROL) injection 80 mg     Laterality: Bilateral  Location/Site:  C2-3  Needle size: 25 G  Needle type: Spinal  Needle Placement: Articular  Findings:  -Contrast Used: 0.5 mL iohexol 180 mg iodine/mL   -Comments: Excellent flow of contrast producing a partial arthrogram.  Procedure Details: The region overlying the facet joints mentioned above were localized under fluoroscopic visualization. The needle was inserted down to the level of the lateral mass of the superior articular process of the facet joint to be injected. Then, the needle was "walked off" inferiorly into the lateral aspect of the facet joint. Bi-planar images were used for confirming placement and spot radiographs were documented.  A 0.25 ml volume of Omnipaque-240 was injected into the facet joint and a standard partial arthrogram was obtained. Radiographs were obtained of the arthrogram. A 0.5 ml. volume of the steroid/anesthetic solution was injected into the joint. This procedure was repeated for each facet joint injected.   Additional Comments:  No complications occurred Dressing: Band-Aid    Post-procedure details: Patient was observed during the procedure. Post-procedure instructions were reviewed.  Patient left the clinic in stable condition.   Clinical History: MRI CERVICAL SPINE WITHOUT CONTRAST   TECHNIQUE: Multiplanar, multisequence MR imaging of the cervical spine was performed. No intravenous contrast was administered.   COMPARISON:  Cervical spine CT 06/21/2022.   FINDINGS: Alignment: Unchanged focal, degenerative kyphosis at C3-4.   Vertebrae: Modic type 1 degenerative endplate marrow signal changes at  C3-4.   Cord: Myelomalacia at C3-4.   Posterior Fossa, vertebral arteries, paraspinal tissues: Unremarkable.   Disc levels:   C2-C3: Severe left facet arthropathy with periarticular edema. Disc bulge  results in mild spinal canal stenosis facet arthropathy and uncovertebral joint spurring contribute to severe bilateral neural foraminal narrowing.   C3-C4: Focal kyphosis and disc bulge results in severe spinal canal stenosis. Facet arthropathy and uncovertebral joint spurring contribute to severe bilateral neural foraminal narrowing.   C4-C5: Small disc bulge without spinal canal stenosis or significant neural foraminal narrowing.   C5-C6: Moderate disc height loss, facet arthropathy and uncovertebral joint spurring contribute to severe left and moderate right neural foraminal narrowing. No spinal canal stenosis.   C6-C7: Disc bulge results in mild spinal canal stenosis. Facet arthropathy and uncovertebral joint spurring contribute to severe right and moderate left neural foraminal narrowing.   C7-T1: Disc bulge and left-sided facet arthropathy contribute to moderate left neural foraminal narrowing. No spinal canal stenosis.   IMPRESSION: 1. Multilevel cervical spondylosis, worst at C3-4 where there is severe spinal canal stenosis with myelomalacia and severe bilateral neural foraminal narrowing. 2. Severe left-sided facet arthropathy at C2-3 with periarticular edema, which can be a cause of neck pain. 3. Mild spinal canal stenosis at C2-3 and C6-7. 4. Multilevel moderate to severe neural foraminal stenoses, as described above.     Electronically Signed   By: Orvan Falconer M.D.   On: 11/03/2022 14:09     Objective:  VS:  HT:    WT:   BMI:     BP:129/86  HR:64bpm  TEMP: ( )  RESP:  Physical Exam Vitals and nursing note reviewed.  Constitutional:      General: He is not in acute distress.    Appearance: Normal appearance. He is not ill-appearing.  HENT:     Head: Normocephalic and atraumatic.     Right Ear: External ear normal.     Left Ear: External ear normal.  Eyes:     Extraocular Movements: Extraocular movements intact.  Cardiovascular:     Rate and  Rhythm: Normal rate.     Pulses: Normal pulses.  Abdominal:     General: There is no distension.     Palpations: Abdomen is soft.  Musculoskeletal:        General: No signs of injury.     Cervical back: Neck supple. Tenderness present. No rigidity.     Right lower leg: No edema.     Left lower leg: No edema.     Comments: Patient has good strength in the upper extremities with 5 out of 5 strength in wrist extension long finger flexion APB.  No intrinsic hand muscle atrophy.  Negative Hoffmann's test.  Lymphadenopathy:     Cervical: No cervical adenopathy.  Skin:    Findings: No erythema or rash.  Neurological:     General: No focal deficit present.     Mental Status: He is alert and oriented to person, place, and time.     Sensory: No sensory deficit.     Motor: No weakness or abnormal muscle tone.     Coordination: Coordination normal.  Psychiatric:        Mood and Affect: Mood normal.        Behavior: Behavior normal.      Imaging: No results found.

## 2022-12-28 ENCOUNTER — Other Ambulatory Visit (HOSPITAL_COMMUNITY): Payer: Self-pay

## 2022-12-28 ENCOUNTER — Other Ambulatory Visit: Payer: Self-pay | Admitting: Family Medicine

## 2022-12-28 DIAGNOSIS — E1169 Type 2 diabetes mellitus with other specified complication: Secondary | ICD-10-CM

## 2022-12-28 MED ORDER — ROSUVASTATIN CALCIUM 10 MG PO TABS
10.0000 mg | ORAL_TABLET | Freq: Every day | ORAL | 1 refills | Status: DC
  Filled 2022-12-28: qty 90, 90d supply, fill #0
  Filled 2023-03-19: qty 90, 90d supply, fill #1

## 2022-12-29 NOTE — Progress Notes (Deleted)
[facility]: Clinic Note  Subjective:  History of Present Illness: William Bocock. is a 62 y.o. year old male who presents for 3 month follow up of his chronic medical conditions.  - Dr Alvester Morin with ortho did a intra-articular injection on 4/10  Has dizziness come back with coreg cut in half?   Polypharmacy  - can talk about decreasing mounjaro and/or rosuvastatin  Urine MACR mildly up - on entresto & jardiance at low doses  # H/O TIA - rosuva 10mg  daily - could decrease to 5mg  daily  - on xarelto  - LDL 37  # T2DM - A1C 6 - jardiance 10mg  daily, mounjaro 5mg  weekly  - could try trulicity .75 then 1.5 and keep it at 1.5; could decrease to 2.5, but not CV benefit; or could stop   Chronic stable issues # OSA- on bipap, Dr young  # GERD - PPI  # psoriatic arthritis - Taltz (ixekizumab) injections sent to [facility] pharmy   # polycythemia vera - phlebotomy w dr Arbutus Ped  # afib - xarelto & coreg now 12.5mg  BID  # MSSA infection of r knee, 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   Past Medical History:  Patient Active Problem List   Diagnosis Date Noted   Dizziness 11/11/2022   Polypharmacy 11/11/2022   Staphylococcal arthritis, right knee (HCC) 10/07/2022   Neck pain on left side 10/07/2022   Type 2 diabetes mellitus (HCC) 10/06/2022   (HFpEF) heart failure with preserved ejection fraction (HCC) 10/06/2022   Tubular adenoma of colon 10/18/2017   Diabetic nephropathy associated with type 2 diabetes mellitus (HCC) 10/13/2016   Permanent atrial fibrillation (HCC) 10/13/2016   Polycythemia vera (HCC) 10/09/2014   Essential hypertension 09/17/2014   History of transient ischemic attack 09/05/2012   Gastroesophageal reflux disease 08/17/2011   Obesity (BMI 30-39.9) 08/17/2011   Psoriatic arthritis (HCC) 08/17/2011   Obstructive sleep apnea syndrome 08/05/2009    Social  History: Reviewed in [place]. Pertinent updates today include: ***  Family History: Reviewed in Epic. Pertinent updates today include: None  Medications:  Current Outpatient Medications:    acetaminophen (TYLENOL) 500 MG tablet, Take 500 mg by mouth every 6 (six) hours as needed., Disp: , Rfl:    carvedilol (COREG) 12.5 MG tablet, Take 1 tablet (12.5 mg total) by mouth 2 (two) times daily., Disp: 180 tablet, Rfl: 3   cefadroxil (DURICEF) 500 MG capsule, Take 2 capsules (1,000 mg total) by mouth 2 (two) times daily., Disp: 120 capsule, Rfl: 11   clobetasol cream (TEMOVATE) 0.05 %, APPLY TOPICALLY TO AFFECTED AREA TWICE DAILY., Disp: 30 g, Rfl: 0   empagliflozin (JARDIANCE) 10 MG TABS tablet, Take 1 tablet (10 mg total) by mouth daily., Disp: 90 tablet, Rfl: 1   methocarbamol (ROBAXIN) 500 MG tablet, Take 1 tablet (500 mg total) by mouth every 6 (six) hours as needed for muscle spasms., Disp: 40 tablet, Rfl: 0   Multiple Vitamin (MULTIVITAMIN WITH MINERALS) TABS tablet, Take 1 tablet by mouth daily., Disp: , Rfl:    Oxymetazoline HCl (NASAL SPRAY) 0.05 % SOLN, Place into the nose., Disp: , Rfl:    pantoprazole (PROTONIX) 40 MG tablet, Take 1 tablet (40 mg total) by mouth daily., Disp: 180 tablet, Rfl: 0   rivaroxaban (XARELTO) 20 MG TABS tablet, Take 1 tablet (20 mg total) by mouth daily with supper., Disp: 90 tablet, Rfl: 1  rosuvastatin (CRESTOR) 10 MG tablet, Take 1 tablet (10 mg total) by mouth daily., Disp: 90 tablet, Rfl: 1   sacubitril-valsartan (ENTRESTO) 24-26 MG, Take 1 tablet by mouth 2 (two) times daily., Disp: 180 tablet, Rfl: 2   TALTZ 80 MG/ML SOAJ, Inject 1 ml (80mg ) under the skin every 4 weeks., Disp: 1 mL, Rfl: 10   tirzepatide (MOUNJARO) 5 MG/0.5ML Pen, Inject 5 mg into the skin once a week., Disp: 6 mL, Rfl: 1   Vitamin D, Cholecalciferol, 1000 units CAPS, Take 1,000 Units by mouth daily. , Disp: , Rfl:    Allergies: Allergies  Allergen Reactions   Morphine And Related Other  (See Comments)    Hallucinations    Review of Systems: ROS   Objective:   Vitals: There were no vitals filed for this visit.  Physical Exam: Physical Exam   Data: Labs, imaging, and micro were reviewed in Epic. Refer to Assessment and Plan below for full details in Problem-Based Charting.  Assessment & Plan:  No problem-specific Assessment & Plan notes found for this encounter.     Patient will follow up in ***  Mercie Eon, MD

## 2022-12-30 ENCOUNTER — Encounter: Payer: 59 | Admitting: Internal Medicine

## 2023-01-05 ENCOUNTER — Other Ambulatory Visit (HOSPITAL_COMMUNITY): Payer: Self-pay

## 2023-01-05 ENCOUNTER — Other Ambulatory Visit: Payer: Self-pay

## 2023-01-06 ENCOUNTER — Other Ambulatory Visit (HOSPITAL_COMMUNITY): Payer: Self-pay

## 2023-01-08 ENCOUNTER — Other Ambulatory Visit: Payer: Self-pay

## 2023-01-08 ENCOUNTER — Other Ambulatory Visit (HOSPITAL_COMMUNITY): Payer: Self-pay

## 2023-01-11 ENCOUNTER — Other Ambulatory Visit (HOSPITAL_COMMUNITY): Payer: Self-pay

## 2023-01-13 ENCOUNTER — Ambulatory Visit: Payer: 59 | Admitting: Orthopedic Surgery

## 2023-01-13 DIAGNOSIS — M542 Cervicalgia: Secondary | ICD-10-CM | POA: Diagnosis not present
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
## 2023-01-20 ENCOUNTER — Other Ambulatory Visit (HOSPITAL_COMMUNITY): Payer: Self-pay

## 2023-01-20 ENCOUNTER — Other Ambulatory Visit: Payer: Self-pay

## 2023-02-01 ENCOUNTER — Other Ambulatory Visit: Payer: Self-pay

## 2023-02-01 MED ORDER — CARVEDILOL 12.5 MG PO TABS: 12.5000 mg | ORAL_TABLET | Freq: Two times a day (BID) | ORAL | 3 refills | Status: DC

## 2023-02-02 ENCOUNTER — Other Ambulatory Visit (HOSPITAL_COMMUNITY): Payer: Self-pay

## 2023-02-02 MED ORDER — CARVEDILOL 12.5 MG PO TABS
12.5000 mg | ORAL_TABLET | Freq: Two times a day (BID) | ORAL | 3 refills | Status: DC
  Filled 2023-02-02: qty 180, 90d supply, fill #0
  Filled 2023-04-29: qty 180, 90d supply, fill #1
  Filled 2023-07-28: qty 180, 90d supply, fill #2
  Filled 2023-10-26: qty 180, 90d supply, fill #3

## 2023-02-02 NOTE — Addendum Note (Signed)
Addended by: Derrek Monaco on: 02/02/2023 10:43 AM   Modules accepted: Orders

## 2023-02-02 NOTE — Addendum Note (Signed)
Addended by: Fredderick Severance on: 02/02/2023 10:32 AM   Modules accepted: Orders

## 2023-02-03 ENCOUNTER — Encounter: Payer: Self-pay | Admitting: Physical Medicine and Rehabilitation

## 2023-02-03 ENCOUNTER — Other Ambulatory Visit (HOSPITAL_COMMUNITY): Payer: Self-pay

## 2023-02-03 ENCOUNTER — Other Ambulatory Visit: Payer: Self-pay | Admitting: Physical Medicine and Rehabilitation

## 2023-02-03 DIAGNOSIS — M47812 Spondylosis without myelopathy or radiculopathy, cervical region: Secondary | ICD-10-CM

## 2023-02-03 DIAGNOSIS — M542 Cervicalgia: Secondary | ICD-10-CM

## 2023-02-05 ENCOUNTER — Other Ambulatory Visit (HOSPITAL_COMMUNITY): Payer: Self-pay

## 2023-02-15 ENCOUNTER — Other Ambulatory Visit: Payer: Self-pay | Admitting: Cardiovascular Disease

## 2023-02-15 ENCOUNTER — Other Ambulatory Visit: Payer: Self-pay

## 2023-02-15 ENCOUNTER — Ambulatory Visit: Payer: 59 | Admitting: Physical Medicine and Rehabilitation

## 2023-02-15 VITALS — BP 118/74 | HR 46

## 2023-02-15 DIAGNOSIS — M47812 Spondylosis without myelopathy or radiculopathy, cervical region: Secondary | ICD-10-CM | POA: Diagnosis not present

## 2023-02-15 DIAGNOSIS — I4821 Permanent atrial fibrillation: Secondary | ICD-10-CM

## 2023-02-15 MED ORDER — METHYLPREDNISOLONE ACETATE 80 MG/ML IJ SUSP
80.0000 mg | Freq: Once | INTRAMUSCULAR | Status: AC
  Administered 2023-02-15: 80 mg

## 2023-02-15 NOTE — Patient Instructions (Signed)

## 2023-02-15 NOTE — Progress Notes (Signed)
Functional Pain Scale - descriptive words and definitions  Moderate (4)   Constantly aware of pain, can complete ADLs with modification/sleep marginally affected at times/passive distraction is of no use, but active distraction gives some relief. Moderate range order  Average Pain 2-3   +Driver, -BT, -Dye Allergies.  Neck pain on the left with no radiation. Sleeping and turning to the left makes pain worse

## 2023-02-16 ENCOUNTER — Other Ambulatory Visit (HOSPITAL_COMMUNITY): Payer: Self-pay

## 2023-02-16 MED ORDER — RIVAROXABAN 20 MG PO TABS
20.0000 mg | ORAL_TABLET | Freq: Every day | ORAL | 1 refills | Status: DC
Start: 2023-02-16 — End: 2023-08-10
  Filled 2023-02-16: qty 90, 90d supply, fill #0
  Filled 2023-05-05: qty 90, 90d supply, fill #1

## 2023-02-16 NOTE — Telephone Encounter (Signed)
Prescription refill request for Xarelto received.  Indication:afib Last office visit:1/24 Weight:122.2  kg Age:62 Scr:1.18  8/23 CrCl:110.75  ml/min  Prescription refilled

## 2023-02-17 ENCOUNTER — Ambulatory Visit: Payer: 59 | Attending: Internal Medicine | Admitting: Pharmacist

## 2023-02-17 DIAGNOSIS — Z79899 Other long term (current) drug therapy: Secondary | ICD-10-CM

## 2023-02-17 NOTE — Progress Notes (Signed)
S: Patient presents today for review of his specialty medication.  Patient is currently taking Taltz for psoriatric arthritis. Patient is managed by Dr. Doreen Beam for this.  Adherence: denies any missed doses.  Efficacy: reports that it is still working very well for him.   Dosing:  SubQ:  80 mg every 4 weeks.  Drug-drug interactions: none  Screening: TB test: completed yearly, negative Hepatitis: completed  Monitoring: S/sx of infection: none CBC: see below S/sx of hypersensitivity: none S/sx of malignancy: none S/sx of heart failure: none  O:  Lab Results  Component Value Date   WBC 7.3 11/11/2022   HGB 17.0 11/11/2022   HCT 52.2 (H) 11/11/2022   MCV 83 11/11/2022   PLT 196 11/11/2022      Chemistry      Component Value Date/Time   NA 138 11/11/2022 1204   NA 138 05/22/2015 1303   K 4.2 11/11/2022 1204   K 4.1 05/22/2015 1303   CL 102 11/11/2022 1204   CO2 19 (L) 11/11/2022 1204   CO2 23 05/22/2015 1303   BUN 13 11/11/2022 1204   BUN 17.4 05/22/2015 1303   CREATININE 1.14 11/11/2022 1204   CREATININE 1.18 04/14/2022 1320   CREATININE 1.18 09/03/2020 1620   CREATININE 1.1 05/22/2015 1303      Component Value Date/Time   CALCIUM 9.3 11/11/2022 1204   CALCIUM 9.4 05/22/2015 1303   ALKPHOS 45 04/14/2022 1320   ALKPHOS 73 05/22/2015 1303   AST 23 04/14/2022 1320   AST 24 05/22/2015 1303   ALT 21 04/14/2022 1320   ALT 30 05/22/2015 1303   BILITOT 1.2 04/14/2022 1320   BILITOT 0.77 05/22/2015 1303       A/P: 1. Medication review: patient is on Taltz and tolerating it well without any adverse effects. Altamease Oiler was restarted last year and the patient has been doing well since.  Reviewed the medication, including injection technique, adverse effects, and efficacy. No recommendations for changes.   Butch Penny, PharmD, [place]y [place], CPP Clinical Pharmacist [facility] [phone]

## 2023-02-18 ENCOUNTER — Encounter: Payer: 59 | Admitting: Family Medicine

## 2023-02-24 NOTE — Procedures (Signed)
Cervical Facet Joint Intra-Articular Injection with Fluoroscopic Guidance  Patient: [name].      Date of Birth: 1961-08-28 MRN: [id number] PCP: Mercie Eon, MD      Visit Date: 02/15/2023   Universal Protocol:    Date/Time: 06/26/245:51 AM  Consent Given By: the patient  Position: PRONE  Additional Comments: Vital signs were monitored before and after the procedure. Patient was prepped and draped in the usual sterile fashion. The correct patient, procedure, and site was verified.   Injection Procedure Details:  Procedure Site One Meds Administered:  Meds ordered this encounter  Medications   methylPREDNISolone acetate (DEPO-MEDROL) injection 80 mg     Laterality: Bilateral  Location/Site:  C2-3  Needle size: 25 G  Needle type: Spinal  Needle Placement: Articular  Findings:  -Contrast Used: 0.5 mL iohexol 180 mg iodine/mL   -Comments: Excellent flow of contrast producing a partial arthrogram.  Procedure Details: The region overlying the facet joints mentioned above were localized under fluoroscopic visualization. The needle was inserted down to the level of the lateral mass of the superior articular process of the facet joint to be injected. Then, the needle was "walked off" inferiorly into the lateral aspect of the facet joint. Bi-planar images were used for confirming placement and spot radiographs were documented.  A 0.25 ml volume of Omnipaque-240 was injected into the facet joint and a standard partial arthrogram was obtained. Radiographs were obtained of the arthrogram. A 0.5 ml. volume of the steroid/anesthetic solution was injected into the joint. This procedure was repeated for each facet joint injected.   Additional Comments:  No complications occurred Dressing: Band-Aid    Post-procedure details: Patient was observed during the procedure. Post-procedure instructions were reviewed.  Patient left the clinic in stable condition.

## 2023-02-24 NOTE — Progress Notes (Signed)
[name]. - 62 y.o. male MRN [id number]  Date of birth: [birth year]  Office Visit Note: Visit Date: 02/15/2023 PCP: Mercie Eon, MD Referred by: London Sheer, MD  Subjective: Chief Complaint  Patient presents with   Neck - Pain   HPI:  William Opdahl. is a 62 y.o. male who comes in today for planned repeat Bilateral C2-3 Cervical facet/medial branch block with fluoroscopic guidance.  The patient has failed conservative care including home exercise, medications, time and activity modification.  This injection will be diagnostic and hopefully therapeutic.  Please see requesting physician notes for further details and justification.  Exam shows concordant low back pain with facet joint loading and extension. Patient received more than 80% pain relief from prior injection. This would be the second block in a diagnostic double block paradigm.     Referring:Dr. Willia Craze   ROS Otherwise per HPI.  Assessment & Plan: Visit Diagnoses:    ICD-10-CM   1. Cervical spondylosis without myelopathy  M47.812 XR C-ARM NO REPORT    Facet Injection    methylPREDNISolone acetate (DEPO-MEDROL) injection 80 mg      Plan: No additional findings.   Meds & Orders:  Meds ordered this encounter  Medications   methylPREDNISolone acetate (DEPO-MEDROL) injection 80 mg    Orders Placed This Encounter  Procedures   Facet Injection   XR C-ARM NO REPORT    Follow-up: Return for visit to requesting provider as needed.   Procedures: No procedures performed  Cervical Facet Joint Intra-Articular Injection with Fluoroscopic Guidance  Patient: [name].      Date of Birth: 1960/12/03 MRN: [id number] PCP: Mercie Eon, MD      Visit Date: 02/15/2023   Universal Protocol:    Date/Time: 06/26/245:51 AM  Consent Given By: the patient  Position: PRONE  Additional Comments: Vital signs were monitored before and after the procedure. Patient was prepped and draped in  the usual sterile fashion. The correct patient, procedure, and site was verified.   Injection Procedure Details:  Procedure Site One Meds Administered:  Meds ordered this encounter  Medications   methylPREDNISolone acetate (DEPO-MEDROL) injection 80 mg     Laterality: Bilateral  Location/Site:  C2-3  Needle size: 25 G  Needle type: Spinal  Needle Placement: Articular  Findings:  -Contrast Used: 0.5 mL iohexol 180 mg iodine/mL   -Comments: Excellent flow of contrast producing a partial arthrogram.  Procedure Details: The region overlying the facet joints mentioned above were localized under fluoroscopic visualization. The needle was inserted down to the level of the lateral mass of the superior articular process of the facet joint to be injected. Then, the needle was "walked off" inferiorly into the lateral aspect of the facet joint. Bi-planar images were used for confirming placement and spot radiographs were documented.  A 0.25 ml volume of Omnipaque-240 was injected into the facet joint and a standard partial arthrogram was obtained. Radiographs were obtained of the arthrogram. A 0.5 ml. volume of the steroid/anesthetic solution was injected into the joint. This procedure was repeated for each facet joint injected.   Additional Comments:  No complications occurred Dressing: Band-Aid    Post-procedure details: Patient was observed during the procedure. Post-procedure instructions were reviewed.  Patient left the clinic in stable condition.   Clinical History: MRI CERVICAL SPINE WITHOUT CONTRAST   TECHNIQUE: Multiplanar, multisequence MR imaging of the cervical spine was performed. No intravenous contrast was administered.   COMPARISON:  Cervical  spine CT 06/21/2022.   FINDINGS: Alignment: Unchanged focal, degenerative kyphosis at C3-4.   Vertebrae: Modic type 1 degenerative endplate marrow signal changes at C3-4.   Cord: Myelomalacia at C3-4.   Posterior  Fossa, vertebral arteries, paraspinal tissues: Unremarkable.   Disc levels:   C2-C3: Severe left facet arthropathy with periarticular edema. Disc bulge results in mild spinal canal stenosis facet arthropathy and uncovertebral joint spurring contribute to severe bilateral neural foraminal narrowing.   C3-C4: Focal kyphosis and disc bulge results in severe spinal canal stenosis. Facet arthropathy and uncovertebral joint spurring contribute to severe bilateral neural foraminal narrowing.   C4-C5: Small disc bulge without spinal canal stenosis or significant neural foraminal narrowing.   C5-C6: Moderate disc height loss, facet arthropathy and uncovertebral joint spurring contribute to severe left and moderate right neural foraminal narrowing. No spinal canal stenosis.   C6-C7: Disc bulge results in mild spinal canal stenosis. Facet arthropathy and uncovertebral joint spurring contribute to severe right and moderate left neural foraminal narrowing.   C7-T1: Disc bulge and left-sided facet arthropathy contribute to moderate left neural foraminal narrowing. No spinal canal stenosis.   IMPRESSION: 1. Multilevel cervical spondylosis, worst at C3-4 where there is severe spinal canal stenosis with myelomalacia and severe bilateral neural foraminal narrowing. 2. Severe left-sided facet arthropathy at C2-3 with periarticular edema, which can be a cause of neck pain. 3. Mild spinal canal stenosis at C2-3 and C6-7. 4. Multilevel moderate to severe neural foraminal stenoses, as described above.     Electronically Signed   By: Orvan Falconer M.D.   On: 11/03/2022 14:09     Objective:  VS:  HT:    WT:   BMI:     BP:118/74  HR:(!) 46bpm  TEMP: ( )  RESP:  Physical Exam Vitals and nursing note reviewed.  Constitutional:      General: He is not in acute distress.    Appearance: Normal appearance. He is not ill-appearing.  HENT:     Head: Normocephalic and atraumatic.     Right  Ear: External ear normal.     Left Ear: External ear normal.  Eyes:     Extraocular Movements: Extraocular movements intact.  Cardiovascular:     Rate and Rhythm: Normal rate.     Pulses: Normal pulses.  Abdominal:     General: There is no distension.     Palpations: Abdomen is soft.  Musculoskeletal:        General: No signs of injury.     Cervical back: Neck supple. Tenderness present. No rigidity.     Right lower leg: No edema.     Left lower leg: No edema.     Comments: Patient has good strength in the upper extremities with 5 out of 5 strength in wrist extension long finger flexion APB.  No intrinsic hand muscle atrophy.  Negative Hoffmann's test.  Lymphadenopathy:     Cervical: No cervical adenopathy.  Skin:    Findings: No erythema or rash.  Neurological:     General: No focal deficit present.     Mental Status: He is alert and oriented to person, place, and time.     Sensory: No sensory deficit.     Motor: No weakness or abnormal muscle tone.     Coordination: Coordination normal.  Psychiatric:        Mood and Affect: Mood normal.        Behavior: Behavior normal.      Imaging: No results found.

## 2023-02-26 ENCOUNTER — Other Ambulatory Visit: Payer: Self-pay | Admitting: Family Medicine

## 2023-02-26 ENCOUNTER — Other Ambulatory Visit (HOSPITAL_COMMUNITY): Payer: Self-pay

## 2023-03-01 ENCOUNTER — Other Ambulatory Visit: Payer: Self-pay

## 2023-03-01 ENCOUNTER — Encounter: Payer: Self-pay | Admitting: Internal Medicine

## 2023-03-01 ENCOUNTER — Ambulatory Visit: Payer: 59 | Admitting: Internal Medicine

## 2023-03-01 ENCOUNTER — Other Ambulatory Visit (HOSPITAL_COMMUNITY): Payer: Self-pay

## 2023-03-01 VITALS — BP 122/78 | HR 77 | Temp 97.3°F | Wt 266.0 lb

## 2023-03-01 DIAGNOSIS — Z96659 Presence of unspecified artificial knee joint: Secondary | ICD-10-CM

## 2023-03-01 DIAGNOSIS — T8459XD Infection and inflammatory reaction due to other internal joint prosthesis, subsequent encounter: Secondary | ICD-10-CM | POA: Diagnosis not present

## 2023-03-01 LAB — CBC WITH DIFFERENTIAL/PLATELET
Basophils Absolute: 37 cells/uL (ref 0–200)
HCT: 56.8 % — ABNORMAL HIGH (ref 38.5–50.0)
Neutro Abs: 4958 cells/uL (ref 1500–7800)
RBC: 7.08 10*6/uL — ABNORMAL HIGH (ref 4.20–5.80)

## 2023-03-01 MED ORDER — CLOBETASOL PROPIONATE 0.05 % EX CREA
TOPICAL_CREAM | Freq: Two times a day (BID) | CUTANEOUS | 2 refills | Status: DC
  Filled 2023-03-01: qty 30, 30d supply, fill #0
  Filled 2023-04-23: qty 30, 30d supply, fill #1
  Filled 2023-07-11: qty 30, 30d supply, fill #2

## 2023-03-01 NOTE — Progress Notes (Unsigned)
RFV: follow up for chronic right knee pji Patient ID: William Sill., male   DOB: 09-08-1960, 62 y.o.   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 Still has stiffness to the knee, no effusion. Continues to work out to improve LE strength. [place] - now stable- not necessarily losing much further weight other than #20,  Outpatient Encounter Medications as of 03/01/2023  Medication Sig   acetaminophen (TYLENOL) 500 MG tablet Take 500 mg by mouth every 6 (six) hours as needed.   carvedilol (COREG) 12.5 MG tablet Take 1 tablet (12.5 mg total) by mouth 2 (two) times daily.   cefadroxil (DURICEF) 500 MG capsule Take 2 capsules (1,000 mg total) by mouth 2 (two) times daily.   clobetasol cream (TEMOVATE) 0.05 % APPLY TOPICALLY TO AFFECTED AREA TWICE DAILY.   empagliflozin (JARDIANCE) 10 MG TABS tablet Take 1 tablet (10 mg total) by mouth daily.   methocarbamol (ROBAXIN) 500 MG tablet Take 1 tablet (500 mg total) by mouth every 6 (six) hours as needed for muscle spasms.   Multiple Vitamin (MULTIVITAMIN WITH MINERALS) TABS tablet Take 1 tablet by mouth daily.   Oxymetazoline HCl (NASAL SPRAY) 0.05 % SOLN Place into the nose.   pantoprazole (PROTONIX) 40 MG tablet Take 1 tablet (40 mg total) by mouth daily.   rivaroxaban (XARELTO) 20 MG TABS tablet Take 1 tablet (20 mg total) by mouth daily with supper.   rosuvastatin (CRESTOR) 10 MG tablet Take 1 tablet (10 mg total) by mouth daily.   sacubitril-valsartan (ENTRESTO) 24-26 MG Take 1 tablet by mouth 2 (two) times daily.   TALTZ 80 MG/ML SOAJ Inject 1 ml (80mg ) under the skin every 4 weeks.   tirzepatide [facility]) 5 MG/0.5ML Pen Inject 5 mg into the skin once a week.   Vitamin D, Cholecalciferol, 1000 units CAPS Take 1,000 Units by mouth daily.    No facility-administered encounter medications on file as of 03/01/2023.      Patient Active Problem List   Diagnosis Date Noted   Dizziness 11/11/2022   Polypharmacy 11/11/2022   Staphylococcal arthritis, right knee (HCC) 10/07/2022   Neck pain on left side 10/07/2022   Type 2 diabetes mellitus (HCC) 10/06/2022   (HFpEF) heart failure with preserved ejection fraction (HCC) 10/06/2022   Tubular adenoma of colon 10/18/2017   Diabetic nephropathy associated with type 2 diabetes mellitus (HCC) 10/13/2016   Permanent atrial fibrillation (HCC) 10/13/2016   Polycythemia vera (HCC) 10/09/2014   Essential hypertension 09/17/2014   History of transient ischemic attack 09/05/2012   Gastroesophageal reflux disease 08/17/2011   Obesity (BMI 30-39.9) 08/17/2011   Psoriatic arthritis (HCC) 08/17/2011   Obstructive sleep apnea syndrome 08/05/2009     Health Maintenance Due  Topic Date Due   HIV Screening  Never done   COVID-19 Vaccine (5 - 2023-24 season) 05/01/2022   Colonoscopy  12/24/2022     Review of Systems 12 point ros is negative except what is mentioned above. Physical Exam   BP 122/78   Pulse 77   Temp (!) 97.3 F (36.3 C) (Oral)   Wt 266 lb (120.7 kg)   SpO2 95%   BMI 38.17 kg/m   Physical Exam  Constitutional: He is oriented to person, place, and time. He appears well-developed and well-nourished. No distress.  HENT:  Mouth/Throat: Oropharynx is clear and moist. No oropharyngeal  exudate.  Ext: no swelling to right knee; incision is c/d/I. No rash Neurological: He is alert and oriented to person, place, and time.  Skin: Skin is warm and dry. No rash noted. No erythema.  Psychiatric: He has a normal mood and affect. His behavior is normal.     CBC Lab Results  Component Value Date   WBC 7.3 11/11/2022   RBC 6.33 (H) 11/11/2022   HGB 17.0 11/11/2022   HCT 52.2 (H) 11/11/2022   PLT 196 11/11/2022   MCV 83 11/11/2022   MCH 26.9 11/11/2022   MCHC 32.6 11/11/2022   RDW 17.0 (H) 11/11/2022   LYMPHSABS 2.0 04/14/2022   MONOABS 0.6  04/14/2022   EOSABS 0.1 04/14/2022    BMET Lab Results  Component Value Date   NA 138 11/11/2022   K 4.2 11/11/2022   CL 102 11/11/2022   CO2 19 (L) 11/11/2022   GLUCOSE 112 (H) 11/11/2022   BUN 13 11/11/2022   CREATININE 1.14 11/11/2022   CALCIUM 9.3 11/11/2022   GFRNONAA >60 04/14/2022   GFRAA 74 05/27/2020   Lab Results  Component Value Date   ESRSEDRATE 2 03/01/2023   Lab Results  Component Value Date   CRP <3.0 03/01/2023      Assessment and Plan  Chronic right knee pji = will check inflammatory markers . They are WNL Paln to continue with cefadroxil suppression. Refill through 6 months  Long term management = will check sed rate/crp and cr to see that it is stable  Health maintenance= Give covid vaccine today

## 2023-03-02 ENCOUNTER — Other Ambulatory Visit (HOSPITAL_COMMUNITY): Payer: Self-pay

## 2023-03-02 LAB — BASIC METABOLIC PANEL
BUN: 21 mg/dL (ref 7–25)
CO2: 28 mmol/L (ref 20–32)
Calcium: 9.7 mg/dL (ref 8.6–10.3)
Chloride: 102 mmol/L (ref 98–110)
Creat: 1.2 mg/dL (ref 0.70–1.35)
Glucose, Bld: 117 mg/dL — ABNORMAL HIGH (ref 65–99)
Potassium: 4.6 mmol/L (ref 3.5–5.3)
Sodium: 137 mmol/L (ref 135–146)

## 2023-03-02 LAB — CBC WITH DIFFERENTIAL/PLATELET
Absolute Monocytes: 614 {cells}/uL (ref 200–950)
Basophils Relative: 0.5 %
Eosinophils Absolute: 89 {cells}/uL (ref 15–500)
Eosinophils Relative: 1.2 %
Hemoglobin: 18.1 g/dL — ABNORMAL HIGH (ref 13.2–17.1)
Lymphs Abs: 1702 {cells}/uL (ref 850–3900)
MCH: 25.6 pg — ABNORMAL LOW (ref 27.0–33.0)
MCHC: 31.9 g/dL — ABNORMAL LOW (ref 32.0–36.0)
MCV: 80.2 fL (ref 80.0–100.0)
MPV: 9.6 fL (ref 7.5–12.5)
Monocytes Relative: 8.3 %
Neutrophils Relative %: 67 %
Platelets: 190 10*3/uL (ref 140–400)
RDW: 18.6 % — ABNORMAL HIGH (ref 11.0–15.0)
Total Lymphocyte: 23 %
WBC: 7.4 10*3/uL (ref 3.8–10.8)

## 2023-03-02 LAB — SEDIMENTATION RATE: Sed Rate: 2 mm/h (ref 0–20)

## 2023-03-02 LAB — SPECIMEN COMPROMISED

## 2023-03-02 LAB — C-REACTIVE PROTEIN: CRP: <3 mg/L (ref <8.0)

## 2023-03-16 ENCOUNTER — Other Ambulatory Visit (HOSPITAL_COMMUNITY): Payer: Self-pay

## 2023-03-19 ENCOUNTER — Other Ambulatory Visit (HOSPITAL_COMMUNITY): Payer: Self-pay

## 2023-03-21 ENCOUNTER — Other Ambulatory Visit: Payer: Self-pay

## 2023-03-22 NOTE — Progress Notes (Unsigned)
Cardiology Office Note:    Date:  03/23/2023   ID:  William Sill., DOB [birth year], MRN [id number]  PCP:  Mercie Eon, MD  Cardiologist:  Elease Hashimoto   / Allred  Electrophysiologist:  None   Referring MD: Mercie Eon, MD   1.   PFO closure 2.  Atrial fib 3.   Chronic systolic CHF   No chief complaint on file.    Jan.  6, 2020    William Dohrman. is a 62 y.o. male with a hx of persistent atrial fibrillation, dyslipidemia, obstructive sleep apnea He has been seen in the past by Dr. York Ram.  He is seen currently by Dr. Hillis Range and was furred to me for general cardiology issues.  Hx of HTN, PFO - s/p occlusion device.   Hx of CHF  Hx of OSA    Seen with his wife , Galen Daft  Has seen Dr. Jacinto Halim in the past  Has a PFO occlusion device following a TIA   Has had bilateral knee replacements ,  Both in Oct. 2019.   Dr. Johney Frame increased his Coreg to 25  BID ,  Has some fatigue related to this increase  Tolerated the lower dose better .   Is on crutches today ,  Typically gets 7000-8000 steps a day   No CP, no dyspnea,  No sweats, no  PND.  No fever, rash, Sleeps well with CPAP.    Works as a Product/process development scientist   Producer, television/film/video and [employer] )  His son played [place] with my son   Feb. 24, 2020:  [name] is seen today for follow-up of his atrial fibrillation, dyslipidemia struct of sleep apnea.  Has had bilateral knee replacement  Has has an infected right  knee joint ( MSSA ) .   Had a right knee poly exchange  Had 6 weeks of Ancef.     Is on Rifampin for 6 months  Is on Lovenox for now .   Does not want to do coumadin  Doesn't think he wants to go through rehab for his last knee surgery  Has been started on Jardiance. Has lost 50 lbs since Oct.   September 27, 2019: [name] is seen back today for his follow-up of his atrial fibrillation, dyslipidemia, and his chronic systolic congestive heart failure.  Wt is 275 lbs .   Has continued to have issues with his  infected right knee prosthesis  Has had 5 operations on his right knee.   Had his 3rd R TKA . He is feeling better.   Is back on xarelto   Still doing general contracting     April 01, 2020: [name] is seen today for follow-up of his atrial fibrillation, dyslipidemia, chronic systolic congestive heart failure. Weight is 278 pounds No complaints.  No CP, no dyspnea. Walks 7000 steps a day .  Prosthetic R knee issues - slowly improving.    Still has fluid around his knee.  Still on Abx.  Labs reveiewd  - LDL is great - 30 Trigs are 228. Is on a keto diet . Has lost 15 lbs recently .      Oct. 2022:261 Seen with his wife, Galen Daft.   [name] is seen today for follow up of his atrial fib, dyslipidemia , chronic systolic CHF Has OSA, wears his CPAP.  Has thrombocytosis  Is seeing oncology    sees Dr. Maple Hudson ( pulmonary )   S/p PFO closure  Wt = 261 lbs -  has lost 30-llbs over the year  Has had multiple R knee surgeries - all from a persistent infection that has not cleared  Is back walking some ,   does normal activity .  Advised more cycling compared to walking  No CP , no syncope .  Occasional dizziness ( associated with blurred vision ) improves with some glucose,   he thinks its due to hypoglycemia   Has moderate LV dysfunction  - EF 35-40%.   June 23, 2022: Seen today for follow-up of his atrial fibrillation, dyslipidemia, chronic systolic congestive heart failure. He has obstructive sleep apnea.  He wears a CPAP.  Weight today is 275 pounds which is up 14 pounds from last year.  Is on Montjaro for the past 2 months ,  has lost 12 lbs since starting  Has gastroparesis after the last shot   Had an infected knee replacement  Stil on suppressant dose Doxycycline 100 mg BID    Has had Afib for the past 10 years  Has had at least 5 times .   We discussed Afib ablation  He is not sure if he wants to have the procedure  Has had a FPO occlusion device 15 years ago for a TIA ,   This may prevent a successful Afib ablation    September 23, 2022: [name] is seen today for follow-up of his atrial fibrillation.  He has had an ASD closure device which may preclude an atrial fibrillation.  We discussed having him see the electrophysiologist to discuss that possibility. Wt is 268  We again discussed Afib ablation. He does not feel his Afib.   Does not think he wants to go through with the ablation .   We discussed starting Tikosyn.  He is not interested in Tikosyn   Has started [facility]    March 23, 2023 [name] is seen for follow up of is atrial fib Wt is 272 lbs  ( up 4 lbs from last year ) despite being on Mournjaro Watches his salt .  Getting some regular exercise, yard work, Not playing tennis or pickle ball any longer,  golf game is   No Cp  Mild dyspnea  Has some dizziness late in the afternoon - I've suggested he may be volume depleted and may need to add some electrolytes to a bottle of water  In the afternoon     Past Medical History:  Diagnosis Date   Arthritis    Chronic systolic heart failure (HCC) 09/27/2019   Diabetes mellitus without complication (HCC)    TYPE 2   Diverticulosis    Dyslipidemia    GERD (gastroesophageal reflux disease)    Herpes labialis    Hypertension    Longstanding persistent atrial fibrillation (HCC)    Metabolic syndrome    Obesity    Proteinuria    Psoriasis    S/P left TKA 06/28/2018   S/P TKR (total knee replacement), right 05/31/2018   Seborrheic dermatitis    Sleep apnea    very compliant with CPAP, PT NEEDS TO BRING OWN MACHINE   TIA (transient ischemic attack) 2009    Past Surgical History:  Procedure Laterality Date   CARDIOVERSION N/A 12/02/2016   Procedure: CARDIOVERSION;  Surgeon: Chrystie Nose, MD;  Location: [facility] ENDOSCOPY;  Service: Cardiovascular;  Laterality: N/A;   COLONOSCOPY     EXCISIONAL TOTAL KNEE ARTHROPLASTY WITH ANTIBIOTIC SPACERS Right 03/28/2019   Procedure: EXCISIONAL TOTAL KNEE  ARTHROPLASTY WITH ANTIBIOTIC SPACERS;  Surgeon: Durene Romans,  MD;  Location: [facility] ORS;  Service: Orthopedics;  Laterality: Right;  90 mins   I & D KNEE WITH POLY EXCHANGE Right 09/10/2018   Procedure: Right Knee Arthroplasty IRRIGATION AND DEBRIDEMENT KNEE WITH POLY EXCHANGE;  Surgeon: Durene Romans, MD;  Location: [facility] ORS;  Service: Orthopedics;  Laterality: Right;   I & D KNEE WITH POLY EXCHANGE Right 06/25/2020   Procedure: Open excisional and non excisional debridement right knee, possible aspiration versus open arthrotomy poly exchange;  Surgeon: Durene Romans, MD;  Location: [facility] ORS;  Service: Orthopedics;  Laterality: Right;   IRRIGATION AND DEBRIDEMENT KNEE Right 07/09/2020   Procedure: IRRIGATION AND DEBRIDEMENT KNEE;  Surgeon: Durene Romans, MD;  Location: [facility] ORS;  Service: Orthopedics;  Laterality: Right;   JOINT REPLACEMENT  11/03/10   LT HIP   PFO occluder cardiac valve  2006   Dr. Jacinto Halim, (hole in heart)   REIMPLANTATION OF TOTAL KNEE Right 06/15/2019   Procedure: Resection of antibiotic spacer and  irrigation and debridment and placement of new antibiotic spacer and components;  Surgeon: Durene Romans, MD;  Location: [facility] ORS;  Service: Orthopedics;  Laterality: Right;  90 mins   REIMPLANTATION OF TOTAL KNEE Right 08/22/2019   Procedure: REIMPLANTATION OF TOTAL KNEE;  Surgeon: Durene Romans, MD;  Location: [facility];  Service: Orthopedics;  Laterality: Right;  120 mins   SKIN BIOPSY Left 12/22/2019   epidermal inclusion cyst   TONSILLECTOMY AND ADENOIDECTOMY     TOTAL HIP ARTHROPLASTY Left    TOTAL KNEE ARTHROPLASTY Right 05/31/2018   Procedure: RIGHT TOTAL KNEE ARTHROPLASTY;  Surgeon: Durene Romans, MD;  Location: [facility];  Service: Orthopedics;  Laterality: Right;  70 mins   TOTAL KNEE ARTHROPLASTY Left 06/28/2018   Procedure: LEFT TOTAL KNEE ARTHROPLASTY;  Surgeon: Durene Romans, MD;  Location: [facility];  Service: Orthopedics;  Laterality: Left;  70 mins   WISDOM TOOTH EXTRACTION       Current Medications: Current Meds  Medication Sig   acetaminophen (TYLENOL) 500 MG tablet Take 500 mg by mouth every 6 (six) hours as needed.   carvedilol (COREG) 12.5 MG tablet Take 1 tablet (12.5 mg total) by mouth 2 (two) times daily.   cefadroxil (DURICEF) 500 MG capsule Take 2 capsules (1,000 mg total) by mouth 2 (two) times daily.   clobetasol cream (TEMOVATE) 0.05 % Apply topically to affected area 2 (two) times daily.   empagliflozin (JARDIANCE) 10 MG TABS tablet Take 1 tablet (10 mg total) by mouth daily.   methocarbamol (ROBAXIN) 500 MG tablet Take 1 tablet (500 mg total) by mouth every 6 (six) hours as needed for muscle spasms.   Multiple Vitamin (MULTIVITAMIN WITH MINERALS) TABS tablet Take 1 tablet by mouth daily.   Oxymetazoline HCl (NASAL SPRAY) 0.05 % SOLN Place into the nose.   pantoprazole (PROTONIX) 40 MG tablet Take 1 tablet (40 mg total) by mouth daily.   rivaroxaban (XARELTO) 20 MG TABS tablet Take 1 tablet (20 mg total) by mouth daily with supper.   rosuvastatin (CRESTOR) 10 MG tablet Take 1 tablet (10 mg total) by mouth daily.   sacubitril-valsartan (ENTRESTO) 24-26 MG Take 1 tablet by mouth 2 (two) times daily.   TALTZ 80 MG/ML SOAJ Inject 1 ml (80mg ) under the skin every 4 weeks.   tirzepatide [facility]) 5 MG/0.5ML Pen Inject 5 mg into the skin once a week.   Vitamin D, Cholecalciferol, 1000 units CAPS Take 1,000 Units by mouth daily.      Allergies:   Morphine  and codeine   Social History   Socioeconomic History   Marital status: Married    Spouse name: Not on file   Number of children: 2   Years of education: Not on file   Highest education level: Bachelor's degree (e.g., BA, AB, BS)  Occupational History   Occupation: Information systems manager: [employer]  Tobacco Use   Smoking status: Never   Smokeless tobacco: Never  Vaping Use   Vaping status: Never Used  Substance and Sexual Activity   Alcohol use: Yes    Comment: rarely    Drug use: No   Sexual activity: Yes  Other Topics Concern   Not on file  Social History Narrative   Lives with spouse in [place]   Wife works for [employer] teaching program   Owns Social worker   Social Determinants of Health   Financial Resource Strain: Low Risk  (12/09/2021)   Overall Financial Resource Strain (CARDIA)    Difficulty of Paying Living Expenses: Not hard at all  Food Insecurity: No Food Insecurity (12/09/2021)   Hunger Vital Sign    Worried About Running Out of Food in the Last Year: Never true    Ran Out of Food in the Last Year: Never true  Transportation Needs: No Transportation Needs (12/09/2021)   PRAPARE - Administrator, Civil Service (Medical): No    Lack of Transportation (Non-Medical): No  Physical Activity: Insufficiently Active (12/09/2021)   Exercise Vital Sign    Days of Exercise per Week: 3 days    Minutes of Exercise per Session: 30 min  Stress: Stress Concern Present (12/09/2021)   [employer] of Occupational Health - Occupational Stress Questionnaire    Feeling of Stress : To some extent  Social Connections: Socially Integrated (12/09/2021)   Social Connection and Isolation Panel [NHANES]    Frequency of Communication with Friends and Family: More than three times a week    Frequency of Social Gatherings with Friends and Family: More than three times a week    Attends Religious Services: More than 4 times per year    Active Member of [employer] or Organizations: Yes    Attends Engineer, structural: More than 4 times per year    Marital Status: Married     Family History: The patient's family history includes Cancer in an other family member; Crohn's disease in his mother; Esophageal cancer in his father; Obesity in an other family member; Uterine cancer (age of onset: 62) in his mother.  ROS:   Please see the history of present illness.     All other systems reviewed and are negative.  EKGs/Labs/Other Studies  Reviewed:        EKG:      Recent Labs: 04/14/2022: ALT 21 03/01/2023: BUN 21; Creat 1.20; Hemoglobin 18.1; Platelets 190; Potassium 4.6; Sodium 137  Recent Lipid Panel    Component Value Date/Time   CHOL 99 (L) 10/07/2022 1008   TRIG 146 10/07/2022 1008   HDL 37 (L) 10/07/2022 1008   CHOLHDL 2.7 10/07/2022 1008   CHOLHDL 4.1 10/13/2016 0956   VLDL 65 (H) 10/13/2016 0956   LDLCALC 37 10/07/2022 1008    Physical Exam:     Physical Exam: Blood pressure 130/88, pulse 86, height 5\' 10"  (1.778 m), weight 272 lb 6.4 oz (123.6 kg), SpO2 98%.       GEN:  Well nourished, well developed in no acute distress HEENT: Normal NECK: No JVD;  No carotid bruits LYMPHATICS: No lymphadenopathy CARDIAC: RRR , no murmurs, rubs, gallops RESPIRATORY:  Clear to auscultation without rales, wheezing or rhonchi  ABDOMEN: Soft, non-tender, non-distended MUSCULOSKELETAL:  No edema; No deformity  SKIN: Warm and dry NEUROLOGIC:  Alert and oriented x 3    ASSESSMENT:    No diagnosis found.    PLAN:       Chronic systolic congestive heart failure:     stable.   Avoids excess salt .      2.  Atrial fibrillation:    Chronic , on xarelto   3.  Hyperlipidemia:  last lipids look great   4.  Obesity :   advised weight loss    This SmartLink has not been configured with any valid records.   Lab Results  Component Value Date   CHOL 102 06/09/2021   HDL 42 06/09/2021   LDLCALC 38 06/09/2021   TRIG 120 06/09/2021   CHOLHDL 2.4 06/09/2021    Cont current meds    Medication Adjustments/Labs and Tests Ordered: Current medicines are reviewed at length with the patient today.  Concerns regarding medicines are outlined above.  No orders of the defined types were placed in this encounter.   No orders of the defined types were placed in this encounter.    There are no Patient Instructions on file for this visit.   Signed, Kristeen Miss, MD  03/23/2023 1:29 PM    [facility]

## 2023-03-23 ENCOUNTER — Ambulatory Visit: Payer: 59 | Attending: Cardiovascular Disease | Admitting: Cardiovascular Disease

## 2023-03-23 ENCOUNTER — Encounter: Payer: Self-pay | Admitting: Cardiovascular Disease

## 2023-03-23 VITALS — BP 130/88 | HR 86 | Ht 70.0 in | Wt 272.4 lb

## 2023-03-23 DIAGNOSIS — I5032 Chronic diastolic (congestive) heart failure: Secondary | ICD-10-CM

## 2023-03-23 DIAGNOSIS — I4821 Permanent atrial fibrillation: Secondary | ICD-10-CM

## 2023-03-23 DIAGNOSIS — I10 Essential (primary) hypertension: Secondary | ICD-10-CM | POA: Diagnosis not present

## 2023-03-23 NOTE — Patient Instructions (Signed)
Medication Instructions:  Your physician recommends that you continue on your current medications as directed. Please refer to the Current Medication list given to you today.  *If you need a refill on your cardiac medications before your next appointment, please call your pharmacy*   Lab Work: NONE If you have labs (blood work) drawn today and your tests are completely normal, you will receive your results only by: MyChart Message (if you have MyChart) OR A paper copy in the mail If you have any lab test that is abnormal or we need to change your treatment, we will call you to review the results.   Testing/Procedures: NONE   Follow-Up: At [facility], you and your health needs are our priority.  As part of our continuing mission to provide you with exceptional heart care, we have created designated Provider Care Teams.  These Care Teams include your primary Cardiologist (physician) and Advanced Practice Providers (APPs -  Physician Assistants and Nurse Practitioners) who all work together to provide you with the care you need, when you need it.  We recommend signing up for the patient portal called "MyChart".  Sign up information is provided on this After Visit Summary.  MyChart is used to connect with patients for Virtual Visits (Telemedicine).  Patients are able to view lab/test results, encounter notes, upcoming appointments, etc.  Non-urgent messages can be sent to your provider as well.   To learn more about what you can do with MyChart, go to [email or web address].    Your next appointment:   1 year(s)  Provider:   Philip [name], MD      

## 2023-03-30 ENCOUNTER — Other Ambulatory Visit: Payer: Self-pay

## 2023-03-31 ENCOUNTER — Other Ambulatory Visit: Payer: Self-pay

## 2023-04-12 ENCOUNTER — Encounter: Payer: Self-pay | Admitting: Internal Medicine

## 2023-04-12 ENCOUNTER — Other Ambulatory Visit (HOSPITAL_COMMUNITY): Payer: Self-pay

## 2023-04-12 ENCOUNTER — Other Ambulatory Visit: Payer: Self-pay | Admitting: Family Medicine

## 2023-04-12 DIAGNOSIS — E1121 Type 2 diabetes mellitus with diabetic nephropathy: Secondary | ICD-10-CM

## 2023-04-13 ENCOUNTER — Other Ambulatory Visit (HOSPITAL_COMMUNITY): Payer: Self-pay

## 2023-04-14 ENCOUNTER — Other Ambulatory Visit (HOSPITAL_COMMUNITY): Payer: Self-pay

## 2023-04-14 MED ORDER — EMPAGLIFLOZIN 10 MG PO TABS
10.0000 mg | ORAL_TABLET | Freq: Every day | ORAL | 1 refills | Status: DC
Start: 2023-04-14 — End: 2023-10-11
  Filled 2023-04-14: qty 90, 90d supply, fill #0
  Filled 2023-04-23 – 2023-07-04 (×2): qty 90, 90d supply, fill #1

## 2023-04-23 ENCOUNTER — Other Ambulatory Visit (HOSPITAL_COMMUNITY): Payer: Self-pay

## 2023-04-23 ENCOUNTER — Other Ambulatory Visit: Payer: Self-pay

## 2023-04-28 ENCOUNTER — Ambulatory Visit: Payer: 59 | Admitting: Internal Medicine

## 2023-04-28 VITALS — BP 145/86 | HR 73 | Temp 97.4°F | Ht 70.0 in | Wt 273.5 lb

## 2023-04-28 DIAGNOSIS — R5383 Other fatigue: Secondary | ICD-10-CM | POA: Insufficient documentation

## 2023-04-28 DIAGNOSIS — Z23 Encounter for immunization: Secondary | ICD-10-CM

## 2023-04-28 DIAGNOSIS — Z0000 Encounter for general adult medical examination without abnormal findings: Secondary | ICD-10-CM | POA: Insufficient documentation

## 2023-04-28 NOTE — Patient Instructions (Signed)
Dear Rosanne Ashing,  I'll call you with your labs.   I'll see you back in 4-6 weeks to follow up with you again.  Please call if you have any questions or if anything changes before then.  Sincerely, Dr. Mercie Eon

## 2023-04-28 NOTE — Assessment & Plan Note (Signed)
Flu shot today 

## 2023-04-28 NOTE — Progress Notes (Signed)
[facility]: Clinic Note  Subjective:  History of Present Illness: [name]. is a 62 y.o. year old male who presents for an acute care visit with fatigue.  He generally felt well this summer. He and [name] went on a vacation on 8/5, and he has felt more fatigued since then. He describes his "energy being zapped." He is able to go to work, but feels very tired in the afternoons, which is not typical for him. Doing small amounts of physical work has been overly tiresome for him. These symptoms have gotten progressively worse, especially in the past week. The fatigue is associated with general body aches and blurry vision in the afternoon. No cough, loss of taste/smell, fevers, or chills. He has chronic nasal congestion, but that hasn't changed. NO chest pain or shortness of breath. His upper shoulders & neck have been tighter than normal. He also describes a "brain fog," where he zones out while doing activities like watching television, which is not typical for him. No falls. No word finding difficulties.  MOCA in office 27 (normal).  He saw Cardiology on 7/23, was doing well then. Saw ID on 7/1, was doing well then, inflam markers were normal.     Please refer to Assessment and Plan below for full details in Problem-Based Charting.   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   Neck pain on left side 10/07/2022   Type 2 diabetes mellitus (HCC) 10/06/2022   (HFpEF) heart failure with preserved ejection fraction (HCC) 10/06/2022   Tubular adenoma of colon 10/18/2017   Diabetic nephropathy associated with type 2 diabetes mellitus (HCC) 10/13/2016   Permanent atrial fibrillation (HCC) 10/13/2016   Polycythemia vera (HCC) 10/09/2014   Essential hypertension 09/17/2014    History of transient ischemic attack 09/05/2012   Gastroesophageal reflux disease 08/17/2011   Obesity (BMI 30-39.9) 08/17/2011   Psoriatic arthritis (HCC) 08/17/2011   Obstructive sleep apnea syndrome 08/05/2009      Medications:  Current Outpatient Medications:    acetaminophen (TYLENOL) 500 MG tablet, Take 500 mg by mouth every 6 (six) hours as needed., Disp: , Rfl:    carvedilol (COREG) 12.5 MG tablet, Take 1 tablet (12.5 mg total) by mouth 2 (two) times daily., Disp: 180 tablet, Rfl: 3   cefadroxil (DURICEF) 500 MG capsule, Take 2 capsules (1,000 mg total) by mouth 2 (two) times daily., Disp: 120 capsule, Rfl: 11   clobetasol cream (TEMOVATE) 0.05 %, Apply topically to affected area 2 (two) times daily., Disp: 30 g, Rfl: 2   empagliflozin (JARDIANCE) 10 MG TABS tablet, Take 1 tablet (10 mg total) by mouth daily., Disp: 90 tablet, Rfl: 1   methocarbamol (ROBAXIN) 500 MG tablet, Take 1 tablet (500 mg total) by mouth every 6 (six) hours as needed for muscle spasms., Disp: 40 tablet, Rfl: 0   Multiple Vitamin (MULTIVITAMIN WITH MINERALS) TABS tablet, Take 1 tablet by mouth daily., Disp: , Rfl:    Oxymetazoline HCl (NASAL SPRAY) 0.05 % SOLN, Place into the nose., Disp: , Rfl:    pantoprazole (PROTONIX) 40 MG tablet, Take 1 tablet (40 mg total) by mouth daily., Disp: 180 tablet, Rfl: 0   rivaroxaban (XARELTO) 20 MG TABS tablet, Take 1 tablet (20 mg total) by mouth daily with supper., Disp: 90 tablet, Rfl: 1  rosuvastatin (CRESTOR) 10 MG tablet, Take 1 tablet (10 mg total) by mouth daily., Disp: 90 tablet, Rfl: 1   sacubitril-valsartan (ENTRESTO) 24-26 MG, Take 1 tablet by mouth 2 (two) times daily., Disp: 180 tablet, Rfl: 2   TALTZ 80 MG/ML SOAJ, Inject 1 ml (80mg ) under the skin every 4 weeks., Disp: 1 mL, Rfl: 10   tirzepatide (MOUNJARO) 5 MG/0.5ML Pen, Inject 5 mg into the skin once a week., Disp: 6 mL, Rfl: 1   Vitamin D, Cholecalciferol, 1000 units CAPS, Take 1,000 Units by mouth  daily. , Disp: , Rfl:    Allergies: Allergies  Allergen Reactions   Morphine And Codeine Other (See Comments)    Hallucinations       Objective:   Vitals: Vitals:   04/28/23 1031  BP: (!) 145/86  Pulse: 73  Temp: (!) 97.4 F (36.3 C)  SpO2: 99%     Physical Exam: Physical Exam Constitutional:      Appearance: Normal appearance.  Cardiovascular:     Rate and Rhythm: Normal rate and regular rhythm.  Pulmonary:     Effort: Pulmonary effort is normal.     Breath sounds: Normal breath sounds.  Musculoskeletal:     Comments: R knee chronically swollen, no acute changes  Neurological:     General: No focal deficit present.     Mental Status: He is alert and oriented to person, place, and time.  Psychiatric:        Mood and Affect: Mood normal.        Behavior: Behavior normal.      Data: Labs, imaging, and micro were reviewed in Epic. Refer to Assessment and Plan below for full details in Problem-Based Charting.  Assessment & Plan:  Fatigue - I don't have a great explanation for [name]'s fatigue. I considered infectious etiologies, but he has no localizing symptoms. Home COVID test was negative. I think the chance that he has a recurrent systemic infection from his knee is low, but inflammatory markers can help rule this out more fully. He has had some associated weight gain, so thyroid disease is possible as well. I am reassured by normal MOCA testing today that he does not have mild cognitive impairment - Check CBC, BMP, TSH, B12, ESR, CRP - I encouraged regular physical activity - Follow up with me in 4-6 weeks - No medication changes made today  Healthcare maintenance - Flu shot today      Patient will follow up in 4-6 weeks  Mercie Eon, MD

## 2023-04-28 NOTE — Assessment & Plan Note (Signed)
-   I don't have a great explanation for [name]'s fatigue. I considered infectious etiologies, but he has no localizing symptoms. Home COVID test was negative. I think the chance that he has a recurrent systemic infection from his knee is low, but inflammatory markers can help rule this out more fully. He has had some associated weight gain, so thyroid disease is possible as well. I am reassured by normal MOCA testing today that he does not have mild cognitive impairment - Check CBC, BMP, TSH, B12, ESR, CRP - I encouraged regular physical activity - Follow up with me in 4-6 weeks - No medication changes made today

## 2023-04-30 LAB — BMP8+ANION GAP
Anion Gap: 15 mmol/L (ref 10.0–18.0)
BUN/Creatinine Ratio: 14 (ref 10–24)
BUN: 15 mg/dL (ref 8–27)
CO2: 21 mmol/L (ref 20–29)
Calcium: 9.2 mg/dL (ref 8.6–10.2)
Chloride: 101 mmol/L (ref 96–106)
Creatinine, Ser: 1.11 mg/dL (ref 0.76–1.27)
Glucose: 105 mg/dL — ABNORMAL HIGH (ref 70–99)
Potassium: 4.6 mmol/L (ref 3.5–5.2)
Sodium: 137 mmol/L (ref 134–144)
eGFR: 75 mL/min/{1.73_m2} (ref >59)

## 2023-04-30 LAB — SEDIMENTATION RATE: Sed Rate: 4 mm/h (ref 0–30)

## 2023-04-30 LAB — VITAMIN B12: Vitamin B-12: 398 pg/mL (ref 232–1245)

## 2023-04-30 LAB — CBC
Hematocrit: 52.9 % — ABNORMAL HIGH (ref 37.5–51.0)
Hemoglobin: 17.5 g/dL (ref 13.0–17.7)
MCH: 26.5 pg — ABNORMAL LOW (ref 26.6–33.0)
MCHC: 33.1 g/dL (ref 31.5–35.7)
MCV: 80 fL (ref 79–97)
Platelets: 190 10*3/uL (ref 150–450)
RBC: 6.61 x10E6/uL — ABNORMAL HIGH (ref 4.14–5.80)
RDW: 18.8 % — ABNORMAL HIGH (ref 11.6–15.4)
WBC: 7.5 10*3/uL (ref 3.4–10.8)

## 2023-04-30 LAB — TSH: TSH: 1.56 u[IU]/mL (ref 0.450–4.500)

## 2023-04-30 LAB — C-REACTIVE PROTEIN: CRP: 3 mg/L (ref 0–10)

## 2023-05-05 ENCOUNTER — Other Ambulatory Visit (HOSPITAL_COMMUNITY): Payer: Self-pay

## 2023-05-10 ENCOUNTER — Other Ambulatory Visit (HOSPITAL_COMMUNITY): Payer: Self-pay

## 2023-05-13 ENCOUNTER — Other Ambulatory Visit: Payer: Self-pay

## 2023-05-13 ENCOUNTER — Encounter: Payer: Self-pay | Admitting: Internal Medicine

## 2023-05-20 ENCOUNTER — Other Ambulatory Visit (HOSPITAL_COMMUNITY): Payer: Self-pay

## 2023-05-20 ENCOUNTER — Encounter (HOSPITAL_COMMUNITY): Payer: Self-pay

## 2023-05-20 ENCOUNTER — Other Ambulatory Visit: Payer: Self-pay | Admitting: Internal Medicine

## 2023-05-20 MED ORDER — MOUNJARO 7.5 MG/0.5ML ~~LOC~~ SOAJ
7.5000 mg | SUBCUTANEOUS | 2 refills | Status: DC
Start: 2023-05-20 — End: 2023-06-09
  Filled 2023-05-20: qty 2, 28d supply, fill #0

## 2023-05-21 ENCOUNTER — Other Ambulatory Visit (HOSPITAL_COMMUNITY): Payer: Self-pay

## 2023-05-22 ENCOUNTER — Encounter (HOSPITAL_COMMUNITY): Payer: Self-pay

## 2023-05-24 ENCOUNTER — Other Ambulatory Visit: Payer: Self-pay

## 2023-05-31 DIAGNOSIS — G4733 Obstructive sleep apnea (adult) (pediatric): Secondary | ICD-10-CM | POA: Diagnosis not present

## 2023-06-03 ENCOUNTER — Other Ambulatory Visit: Payer: Self-pay

## 2023-06-03 NOTE — Progress Notes (Signed)
Specialty Pharmacy Refill Coordination Note  [name]. is a 62 y.o. male contacted today regarding refills of specialty medication(s) Ixekizumab   Patient requested [name] at [facility] at [place] date: 06/11/23   Medication will be filled on 06/10/23. Refill request pending.

## 2023-06-08 ENCOUNTER — Other Ambulatory Visit: Payer: Self-pay

## 2023-06-08 NOTE — Progress Notes (Signed)
Left voicemail. MDO denied refill request, pending office visit.

## 2023-06-08 NOTE — Progress Notes (Unsigned)
[facility]: Clinic Note  Subjective:  History of Present Illness: William Risby. is a 63 y.o. year old male who presents for close follow up of recent decreased energy.  We recently increased mounjaro to 7.5mg  weekly  How is the brain fog and fatigue?   Please refer to Assessment and Plan below for full details in Problem-Based Charting.   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   Neck pain on left side 10/07/2022   Type 2 diabetes mellitus (HCC) 10/06/2022   (HFpEF) heart failure with preserved ejection fraction (HCC) 10/06/2022   Tubular adenoma of colon 10/18/2017   Diabetic nephropathy associated with type 2 diabetes mellitus (HCC) 10/13/2016   Permanent atrial fibrillation (HCC) 10/13/2016   Polycythemia vera (HCC) 10/09/2014   Essential hypertension 09/17/2014   History of transient ischemic attack 09/05/2012   Gastroesophageal reflux disease 08/17/2011   Obesity (BMI 30-39.9) 08/17/2011   Psoriatic arthritis (HCC) 08/17/2011   Obstructive sleep apnea syndrome 08/05/2009      Medications:  Current Outpatient Medications:    acetaminophen (TYLENOL) 500 MG tablet, Take 500 mg by mouth every 6 (six) hours as needed., Disp: , Rfl:    carvedilol (COREG) 12.5 MG tablet, Take 1 tablet (12.5 mg total) by mouth 2 (two) times daily., Disp: 180 tablet, Rfl: 3   cefadroxil (DURICEF) 500 MG capsule, Take 2 capsules (1,000 mg total) by mouth 2 (two) times daily., Disp: 120 capsule, Rfl: 11   clobetasol cream (TEMOVATE) 0.05 %, Apply topically to affected area 2 (two) times daily., Disp: 30 g, Rfl: 2   empagliflozin (JARDIANCE) 10 MG TABS tablet, Take 1 tablet (10 mg total) by mouth daily., Disp: 90 tablet, Rfl: 1   methocarbamol (ROBAXIN) 500 MG tablet, Take 1  tablet (500 mg total) by mouth every 6 (six) hours as needed for muscle spasms., Disp: 40 tablet, Rfl: 0   Multiple Vitamin (MULTIVITAMIN WITH MINERALS) TABS tablet, Take 1 tablet by mouth daily., Disp: , Rfl:    Oxymetazoline HCl (NASAL SPRAY) 0.05 % SOLN, Place into the nose., Disp: , Rfl:    pantoprazole (PROTONIX) 40 MG tablet, Take 1 tablet (40 mg total) by mouth daily., Disp: 180 tablet, Rfl: 0   rivaroxaban (XARELTO) 20 MG TABS tablet, Take 1 tablet (20 mg total) by mouth daily with supper., Disp: 90 tablet, Rfl: 1   rosuvastatin (CRESTOR) 10 MG tablet, Take 1 tablet (10 mg total) by mouth daily., Disp: 90 tablet, Rfl: 1   sacubitril-valsartan (ENTRESTO) 24-26 MG, Take 1 tablet by mouth 2 (two) times daily., Disp: 180 tablet, Rfl: 2   TALTZ 80 MG/ML SOAJ, Inject 1 ml (80mg ) under the skin every 4 weeks., Disp: 1 mL, Rfl: 10   tirzepatide (MOUNJARO) 7.5 MG/0.5ML Pen, Inject 7.5 mg into the skin once a week., Disp: 2 mL, Rfl: 2   Vitamin D, Cholecalciferol, 1000 units CAPS, Take 1,000 Units by mouth daily. , Disp: , Rfl:    Allergies: Allergies  Allergen Reactions   Morphine And Codeine Other (See Comments)    Hallucinations       Objective:   Vitals: There were no vitals filed for this visit.   Physical Exam: Physical Exam   Data: Labs, imaging, and micro were reviewed in Epic. Refer to Assessment and Plan below  for full details in Problem-Based Charting.  Assessment & Plan:  No problem-specific Assessment & Plan notes found for this encounter.     Patient will follow up in ***  Mercie Eon, MD

## 2023-06-09 ENCOUNTER — Other Ambulatory Visit: Payer: Self-pay

## 2023-06-09 ENCOUNTER — Encounter: Payer: Self-pay | Admitting: Internal Medicine

## 2023-06-09 ENCOUNTER — Other Ambulatory Visit (HOSPITAL_COMMUNITY): Payer: Self-pay

## 2023-06-09 ENCOUNTER — Ambulatory Visit: Payer: 59 | Admitting: Internal Medicine

## 2023-06-09 VITALS — BP 103/74 | HR 82 | Temp 97.8°F | Ht 70.0 in | Wt 269.9 lb

## 2023-06-09 DIAGNOSIS — Z0000 Encounter for general adult medical examination without abnormal findings: Secondary | ICD-10-CM

## 2023-06-09 DIAGNOSIS — R5383 Other fatigue: Secondary | ICD-10-CM

## 2023-06-09 DIAGNOSIS — Z1211 Encounter for screening for malignant neoplasm of colon: Secondary | ICD-10-CM

## 2023-06-09 DIAGNOSIS — E669 Obesity, unspecified: Secondary | ICD-10-CM

## 2023-06-09 DIAGNOSIS — M00061 Staphylococcal arthritis, right knee: Secondary | ICD-10-CM

## 2023-06-09 DIAGNOSIS — Z7985 Long-term (current) use of injectable non-insulin antidiabetic drugs: Secondary | ICD-10-CM

## 2023-06-09 DIAGNOSIS — E119 Type 2 diabetes mellitus without complications: Secondary | ICD-10-CM | POA: Diagnosis not present

## 2023-06-09 DIAGNOSIS — Z7984 Long term (current) use of oral hypoglycemic drugs: Secondary | ICD-10-CM | POA: Diagnosis not present

## 2023-06-09 LAB — POCT GLYCOSYLATED HEMOGLOBIN (HGB A1C): Hemoglobin A1C: 5.8 % — AB (ref 4.0–5.6)

## 2023-06-09 LAB — GLUCOSE, CAPILLARY: Glucose-Capillary: 130 mg/dL — ABNORMAL HIGH (ref 70–99)

## 2023-06-09 MED ORDER — MOUNJARO 10 MG/0.5ML ~~LOC~~ SOAJ
10.0000 mg | SUBCUTANEOUS | 3 refills | Status: DC
Start: 2023-06-09 — End: 2023-10-11
  Filled 2023-06-09: qty 0.5, 7d supply, fill #0
  Filled 2023-06-17: qty 2, 28d supply, fill #0
  Filled 2023-07-11: qty 2, 28d supply, fill #1
  Filled 2023-08-10: qty 2, 28d supply, fill #2
  Filled 2023-09-05: qty 2, 28d supply, fill #3

## 2023-06-09 NOTE — Progress Notes (Signed)
Patient contacted the Special[facility] Pharm[facility] regarding his medication. Patient has an upcoming appointment on 06/24/23 with his MD. Medication should be renewed thereafter.

## 2023-06-09 NOTE — Assessment & Plan Note (Signed)
-   We did an extensive workup at last visit, ruling out systemic infection, thyroid disease, cognitive disorder, B12 deficiency, and electrolyte abnormalities. His CPAP is working well. Mood is good. At this point, we both think the fatigue could be related to his overall decline in physical activity and weight gain subacutely. [name] plans to increase physical activity and we will increase [facility]

## 2023-06-09 NOTE — Patient Instructions (Signed)
I placed a referral for a colonoscopy. They'll call you to schedule.  I sent in a refill for [facility] 10mg  weekly.   I'll see you back in 4-5 months.

## 2023-06-09 NOTE — Assessment & Plan Note (Signed)
-   Repeat A1C today - Continue Jardiance & increase Mounjaro

## 2023-06-09 NOTE — Assessment & Plan Note (Signed)
-   Colonoscopy referral placed today - he will get his COVID booster at an outside pharmacy, as we don't have this in clinic

## 2023-06-09 NOTE — Assessment & Plan Note (Signed)
-   Increase Mounjaro from 7.5 to 10mg  weekly - Goal to increase physical activity

## 2023-06-10 ENCOUNTER — Other Ambulatory Visit: Payer: Self-pay

## 2023-06-10 NOTE — Progress Notes (Signed)
Pharmacy Patient Advocate Encounter   Received notification from Patient Pharmacy that prior authorization for Altamease Oiler is required/requested. Previous PA expires on 10/24   Insurance verification completed.   The patient is insured through [facility] .   Per test claim: PA required; PA submitted to [facility] via CoverMyMeds Key/confirmation #/EOC [id number] Status is pending

## 2023-06-11 ENCOUNTER — Other Ambulatory Visit (HOSPITAL_COMMUNITY): Payer: Self-pay

## 2023-06-14 NOTE — Progress Notes (Signed)
Pharmacy Patient Advocate Encounter  Received notification from [facility] that Prior Authorization for Altamease Oiler has been APPROVED from 06/25/23 to 06/12/24   PA #/Case ID/Reference #: [id number]

## 2023-06-15 ENCOUNTER — Encounter: Payer: Self-pay | Admitting: Internal Medicine

## 2023-06-16 ENCOUNTER — Other Ambulatory Visit: Payer: Self-pay

## 2023-06-17 ENCOUNTER — Other Ambulatory Visit (HOSPITAL_COMMUNITY): Payer: Self-pay

## 2023-06-19 ENCOUNTER — Other Ambulatory Visit: Payer: Self-pay

## 2023-06-21 ENCOUNTER — Other Ambulatory Visit: Payer: Self-pay

## 2023-06-21 ENCOUNTER — Other Ambulatory Visit (HOSPITAL_COMMUNITY): Payer: Self-pay

## 2023-06-21 DIAGNOSIS — E1169 Type 2 diabetes mellitus with other specified complication: Secondary | ICD-10-CM

## 2023-06-21 MED ORDER — ROSUVASTATIN CALCIUM 10 MG PO TABS
10.0000 mg | ORAL_TABLET | Freq: Every day | ORAL | 1 refills | Status: DC
Start: 2023-06-21 — End: 2023-12-14
  Filled 2023-06-21: qty 90, 90d supply, fill #0
  Filled 2023-09-19: qty 90, 90d supply, fill #1

## 2023-06-24 ENCOUNTER — Other Ambulatory Visit: Payer: Self-pay | Admitting: Pharmacist

## 2023-06-24 ENCOUNTER — Encounter: Payer: Self-pay | Admitting: Internal Medicine

## 2023-06-24 ENCOUNTER — Other Ambulatory Visit (HOSPITAL_COMMUNITY): Payer: Self-pay

## 2023-06-24 DIAGNOSIS — T24132A Burn of first degree of left lower leg, initial encounter: Secondary | ICD-10-CM | POA: Diagnosis not present

## 2023-06-24 DIAGNOSIS — D692 Other nonthrombocytopenic purpura: Secondary | ICD-10-CM | POA: Diagnosis not present

## 2023-06-24 DIAGNOSIS — L814 Other melanin hyperpigmentation: Secondary | ICD-10-CM | POA: Diagnosis not present

## 2023-06-24 DIAGNOSIS — L821 Other seborrheic keratosis: Secondary | ICD-10-CM | POA: Diagnosis not present

## 2023-06-24 DIAGNOSIS — Z85828 Personal history of other malignant neoplasm of skin: Secondary | ICD-10-CM | POA: Diagnosis not present

## 2023-06-24 DIAGNOSIS — D2262 Melanocytic nevi of left upper limb, including shoulder: Secondary | ICD-10-CM | POA: Diagnosis not present

## 2023-06-24 DIAGNOSIS — L400 Psoriasis vulgaris: Secondary | ICD-10-CM | POA: Diagnosis not present

## 2023-06-24 DIAGNOSIS — B078 Other viral warts: Secondary | ICD-10-CM | POA: Diagnosis not present

## 2023-06-24 MED ORDER — TALTZ 80 MG/ML ~~LOC~~ SOAJ
SUBCUTANEOUS | 3 refills | Status: DC
Start: 2023-06-24 — End: 2023-10-07

## 2023-06-24 MED ORDER — TALTZ 80 MG/ML ~~LOC~~ SOAJ
SUBCUTANEOUS | 3 refills | Status: DC
  Filled 2023-06-24: qty 1, 28d supply, fill #0
  Filled 2023-07-11 – 2023-07-15 (×2): qty 1, 28d supply, fill #1
  Filled 2023-08-04: qty 1, 28d supply, fill #2
  Filled 2023-09-06: qty 1, 28d supply, fill #3
  Filled ????-??-??: fill #1

## 2023-06-24 NOTE — Progress Notes (Signed)
Spoke with patient. We received prescription. PPU on 10/25.

## 2023-06-28 ENCOUNTER — Other Ambulatory Visit (HOSPITAL_COMMUNITY): Payer: Self-pay

## 2023-06-29 ENCOUNTER — Other Ambulatory Visit: Payer: Self-pay

## 2023-07-05 ENCOUNTER — Other Ambulatory Visit: Payer: Self-pay

## 2023-07-06 ENCOUNTER — Other Ambulatory Visit (HOSPITAL_COMMUNITY): Payer: Self-pay

## 2023-07-06 MED ORDER — COVID-19 MRNA VAC-TRIS(PFIZER) 30 MCG/0.3ML IM SUSY
0.3000 mL | PREFILLED_SYRINGE | Freq: Once | INTRAMUSCULAR | 0 refills | Status: AC
  Filled 2023-07-06: qty 0.3, 1d supply, fill #0

## 2023-07-08 ENCOUNTER — Other Ambulatory Visit: Payer: Self-pay

## 2023-07-12 ENCOUNTER — Other Ambulatory Visit (HOSPITAL_COMMUNITY): Payer: Self-pay

## 2023-07-12 ENCOUNTER — Other Ambulatory Visit: Payer: Self-pay

## 2023-07-13 ENCOUNTER — Encounter: Payer: Self-pay | Admitting: Cardiovascular Disease

## 2023-07-14 ENCOUNTER — Other Ambulatory Visit: Payer: Self-pay

## 2023-07-15 ENCOUNTER — Other Ambulatory Visit: Payer: Self-pay

## 2023-07-15 ENCOUNTER — Encounter: Payer: Self-pay | Admitting: Internal Medicine

## 2023-07-15 ENCOUNTER — Encounter (HOSPITAL_COMMUNITY): Payer: Self-pay

## 2023-07-15 ENCOUNTER — Other Ambulatory Visit (HOSPITAL_COMMUNITY): Payer: Self-pay

## 2023-07-15 NOTE — Progress Notes (Signed)
Specialty Pharmacy Ongoing Clinical Assessment Note  [name]. is a 62 y.o. male who is being followed by the specialty pharmacy service for RxSp Psoriatic Arthritis   Patient's specialty medication(s) reviewed today: Ixekizumab   Missed doses in the last 4 weeks: 0   Patient/Caregiver did not have any additional questions or concerns.   Therapeutic benefit summary: Patient is achieving benefit   Adverse events/side effects summary: No adverse events/side effects   Patient's therapy is appropriate to: Continue    Goals Addressed             This Visit's Progress    Minimize recurrence of flares       Patient is on track. Patient will maintain adherence.  Patient reports he is well-controlled at this time and denies any recent flares.          Follow up:  6 months  Servando Snare Specialty Pharmacist

## 2023-07-15 NOTE — Progress Notes (Signed)
Specialty Pharmacy Refill Coordination Note  [name]. is a 62 y.o. male contacted today regarding refills of specialty medication(s) Ixekizumab   Patient requested [name] at [facility] at [place] date: 07/16/23   Medication will be filled on 07/16/23.

## 2023-07-16 ENCOUNTER — Telehealth: Payer: Self-pay | Admitting: Cardiovascular Disease

## 2023-07-16 ENCOUNTER — Other Ambulatory Visit: Payer: Self-pay

## 2023-07-16 NOTE — Telephone Encounter (Signed)
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  I am placing this in Dr. Harvie Bridge box.  Thank you.

## 2023-07-19 ENCOUNTER — Other Ambulatory Visit (HOSPITAL_COMMUNITY): Payer: Self-pay

## 2023-07-23 NOTE — Telephone Encounter (Signed)
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

## 2023-08-03 DIAGNOSIS — Z0100 Encounter for examination of eyes and vision without abnormal findings: Secondary | ICD-10-CM | POA: Diagnosis not present

## 2023-08-04 ENCOUNTER — Other Ambulatory Visit: Payer: Self-pay

## 2023-08-04 NOTE — Progress Notes (Signed)
Specialty Pharmacy Refill Coordination Note  [name]. is a 62 y.o. male contacted today regarding refills of specialty medication(s) Ixekizumab   Patient requested [name] at [facility] at [place] date: 08/11/23   Medication will be filled on 08/11/23.

## 2023-08-10 ENCOUNTER — Other Ambulatory Visit: Payer: Self-pay

## 2023-08-10 ENCOUNTER — Other Ambulatory Visit: Payer: Self-pay | Admitting: Cardiovascular Disease

## 2023-08-10 DIAGNOSIS — I4821 Permanent atrial fibrillation: Secondary | ICD-10-CM

## 2023-08-11 ENCOUNTER — Other Ambulatory Visit: Payer: Self-pay

## 2023-08-11 ENCOUNTER — Other Ambulatory Visit (HOSPITAL_COMMUNITY): Payer: Self-pay

## 2023-08-11 ENCOUNTER — Encounter: Payer: Self-pay | Admitting: Internal Medicine

## 2023-08-11 MED ORDER — RIVAROXABAN 20 MG PO TABS
20.0000 mg | ORAL_TABLET | Freq: Every day | ORAL | 1 refills | Status: DC
  Filled 2023-08-11: qty 90, 90d supply, fill #0
  Filled 2023-11-10: qty 90, 90d supply, fill #1

## 2023-08-11 NOTE — Telephone Encounter (Signed)
Prescription refill request for Xarelto received.  Indication: AF Last office visit: 03/23/23  P [name] MD Weight: 123.6kg Age: 62 Scr: 1.11 on 04/28/23  Epic CrCl: 117.54  Based on above findings Xarelto 20mg  daily is the appropriate dose.  Refill approved.

## 2023-08-12 ENCOUNTER — Other Ambulatory Visit (HOSPITAL_COMMUNITY): Payer: Self-pay

## 2023-08-16 ENCOUNTER — Telehealth: Payer: 59 | Admitting: Family Medicine

## 2023-08-16 ENCOUNTER — Other Ambulatory Visit (HOSPITAL_COMMUNITY): Payer: Self-pay

## 2023-08-16 DIAGNOSIS — B9689 Other specified bacterial agents as the cause of diseases classified elsewhere: Secondary | ICD-10-CM | POA: Diagnosis not present

## 2023-08-16 DIAGNOSIS — J069 Acute upper respiratory infection, unspecified: Secondary | ICD-10-CM | POA: Diagnosis not present

## 2023-08-16 MED ORDER — BENZONATATE 100 MG PO CAPS
100.0000 mg | ORAL_CAPSULE | Freq: Three times a day (TID) | ORAL | 0 refills | Status: DC | PRN
  Filled 2023-08-16: qty 30, 5d supply, fill #0

## 2023-08-16 MED ORDER — AMOXICILLIN-POT CLAVULANATE 875-125 MG PO TABS
1.0000 | ORAL_TABLET | Freq: Two times a day (BID) | ORAL | 0 refills | Status: DC
  Filled 2023-08-16: qty 20, 10d supply, fill #0

## 2023-08-16 NOTE — Progress Notes (Signed)

## 2023-08-29 DIAGNOSIS — G4733 Obstructive sleep apnea (adult) (pediatric): Secondary | ICD-10-CM | POA: Diagnosis not present

## 2023-09-03 ENCOUNTER — Other Ambulatory Visit (HOSPITAL_COMMUNITY): Payer: Self-pay

## 2023-09-06 ENCOUNTER — Other Ambulatory Visit (HOSPITAL_COMMUNITY): Payer: Self-pay

## 2023-09-06 ENCOUNTER — Other Ambulatory Visit (HOSPITAL_COMMUNITY): Payer: Self-pay | Admitting: Pharmacy Technician

## 2023-09-06 ENCOUNTER — Encounter: Payer: Self-pay | Admitting: Internal Medicine

## 2023-09-06 NOTE — Progress Notes (Signed)
 Specialty Pharmacy Refill Coordination Note  [name]. is a 63 y.o. male contacted today regarding refills of specialty medication(s) Ixekizumab  (Taltz )   Patient requested [name] at [facility] at [place] date: 09/13/23   Medication will be filled on 09/10/23.

## 2023-09-07 DIAGNOSIS — G4733 Obstructive sleep apnea (adult) (pediatric): Secondary | ICD-10-CM | POA: Diagnosis not present

## 2023-09-10 ENCOUNTER — Other Ambulatory Visit: Payer: Self-pay

## 2023-09-10 ENCOUNTER — Encounter: Payer: Self-pay | Admitting: Internal Medicine

## 2023-09-12 ENCOUNTER — Other Ambulatory Visit (HOSPITAL_COMMUNITY): Payer: Self-pay

## 2023-09-13 ENCOUNTER — Encounter: Payer: Self-pay | Admitting: Internal Medicine

## 2023-09-13 ENCOUNTER — Other Ambulatory Visit: Payer: Self-pay

## 2023-09-13 ENCOUNTER — Ambulatory Visit: Payer: Commercial Managed Care - PPO | Admitting: Internal Medicine

## 2023-09-13 ENCOUNTER — Other Ambulatory Visit (HOSPITAL_COMMUNITY): Payer: Self-pay

## 2023-09-13 VITALS — BP 132/78 | HR 78 | Resp 16 | Ht 70.0 in | Wt 263.0 lb

## 2023-09-13 DIAGNOSIS — T8453XD Infection and inflammatory reaction due to internal right knee prosthesis, subsequent encounter: Secondary | ICD-10-CM

## 2023-09-13 DIAGNOSIS — Z96659 Presence of unspecified artificial knee joint: Secondary | ICD-10-CM

## 2023-09-13 DIAGNOSIS — T8459XD Infection and inflammatory reaction due to other internal joint prosthesis, subsequent encounter: Secondary | ICD-10-CM | POA: Diagnosis not present

## 2023-09-13 MED ORDER — CEFADROXIL 500 MG PO CAPS
500.0000 mg | ORAL_CAPSULE | Freq: Two times a day (BID) | ORAL | 11 refills | Status: DC
  Filled 2023-09-13 – 2023-09-26 (×3): qty 60, 30d supply, fill #0
  Filled 2023-10-23: qty 60, 30d supply, fill #1
  Filled 2023-12-01: qty 60, 30d supply, fill #2
  Filled 2023-12-28: qty 60, 30d supply, fill #3
  Filled 2024-01-27: qty 60, 30d supply, fill #4
  Filled 2024-02-26: qty 60, 30d supply, fill #5
  Filled 2024-03-24: qty 60, 30d supply, fill #6
  Filled 2024-04-23: qty 60, 30d supply, fill #7
  Filled 2024-05-23: qty 60, 30d supply, fill #8
  Filled 2024-06-20: qty 60, 30d supply, fill #9

## 2023-09-13 NOTE — Progress Notes (Signed)
 Patient ID: William JONETTA Mariellen Mickey., male   DOB: September 25, 1960, 63 y.o.   MRN: [id number]  HPI [name] is a 63yo M with hx of right knee pji with MSSA, complicated by slow wound healing, had repeat washout due to hematoma. Since then has been on cefadroxil  1000mg  po bid. He doesn't necessarily have any issues with his knee. Has some morning with general arthritic stiffness of joints but otherwise improve. He has been continue on monjaro. Has plateaud on his weight loss. He mentions always having cold feet, sleeps with extra bedding to keep warm  Outpatient Encounter Medications as of 09/13/2023  Medication Sig   acetaminophen  (TYLENOL ) 500 MG tablet Take 500 mg by mouth every 6 (six) hours as needed.   carvedilol  (COREG ) 12.5 MG tablet Take 1 tablet (12.5 mg total) by mouth 2 (two) times daily.   cefadroxil  (DURICEF) 500 MG capsule Take 2 capsules (1,000 mg total) by mouth 2 (two) times daily.   clobetasol  cream (TEMOVATE ) 0.05 % Apply topically to affected area 2 (two) times daily.   empagliflozin  (JARDIANCE ) 10 MG TABS tablet Take 1 tablet (10 mg total) by mouth daily.   methocarbamol  (ROBAXIN ) 500 MG tablet Take 1 tablet (500 mg total) by mouth every 6 (six) hours as needed for muscle spasms.   Multiple Vitamin (MULTIVITAMIN WITH MINERALS) TABS tablet Take 1 tablet by mouth daily.   Oxymetazoline  HCl (NASAL SPRAY) 0.05 % SOLN Place into the nose.   pantoprazole  (PROTONIX ) 40 MG tablet Take 1 tablet (40 mg total) by mouth daily.   rivaroxaban  (XARELTO ) 20 MG TABS tablet Take 1 tablet (20 mg total) by mouth daily with supper.   rosuvastatin  (CRESTOR ) 10 MG tablet Take 1 tablet (10 mg total) by mouth daily.   sacubitril -valsartan  (ENTRESTO ) 24-26 MG Take 1 tablet by mouth 2 (two) times daily.   TALTZ  80 MG/ML pen Inject 1 ml (80mg ) under the skin every 4 weeks.   tirzepatide  (MOUNJARO ) 10 MG/0.5ML Pen Inject 10 mg into the skin once a week.   Vitamin D , Cholecalciferol , 1000 units CAPS Take 1,000  Units by mouth daily.    amoxicillin -clavulanate (AUGMENTIN ) 875-125 MG tablet Take 1 tablet by mouth 2 (two) times daily. (Patient not taking: Reported on 09/13/2023)   benzonatate  (TESSALON ) 100 MG capsule Take 1-2 capsules (100-200 mg total) by mouth 3 (three) times daily as needed. (Patient not taking: Reported on 09/13/2023)   No facility-administered encounter medications on file as of 09/13/2023.     Patient Active Problem List   Diagnosis Date Noted   Fatigue 04/28/2023   Healthcare maintenance 04/28/2023   Polypharmacy 11/11/2022   Staphylococcal arthritis, right knee (HCC) 10/07/2022   Neck pain on left side 10/07/2022   Type 2 diabetes mellitus (HCC) 10/06/2022   (HFpEF) heart failure with preserved ejection fraction (HCC) 10/06/2022   Tubular adenoma of colon 10/18/2017   Diabetic nephropathy associated with type 2 diabetes mellitus (HCC) 10/13/2016   Permanent atrial fibrillation (HCC) 10/13/2016   Polycythemia vera (HCC) 10/09/2014   Essential hypertension 09/17/2014   History of transient ischemic attack 09/05/2012   Gastroesophageal reflux disease 08/17/2011   Obesity (BMI 30-39.9) 08/17/2011   Psoriatic arthritis (HCC) 08/17/2011   Obstructive sleep apnea syndrome 08/05/2009     Health Maintenance Due  Topic Date Due   HIV Screening  Never done   Colonoscopy  12/24/2022   OPHTHALMOLOGY EXAM  07/28/2023   COVID-19 Vaccine (6 - 2024-25 season) 08/31/2023   Diabetic kidney evaluation -  Urine ACR  10/08/2023     Review of Systems 10 point ros reviewed, positive pertinents in hpi Physical Exam   BP 132/78   Pulse 78   Resp 16   Ht 5' 10 (1.778 m)   Wt 263 lb (119.3 kg)   BMI 37.74 kg/m   Physical Exam  Constitutional: He is oriented to person, place, and time. He appears well-developed and well-nourished. No distress.  HENT:  Mouth/Throat: Oropharynx is clear and moist. No oropharyngeal exudate.  Ext: no effusion to knee. Warm feet no  cyanosis Neurological: He is alert and oriented to person, place, and time.  Skin: Skin is warm and dry. No rash noted. No erythema.  Psychiatric: He has a normal mood and affect. His behavior is normal.    CBC Lab Results  Component Value Date   WBC 7.5 04/28/2023   RBC 6.61 (H) 04/28/2023   HGB 17.5 04/28/2023   HCT 52.9 (H) 04/28/2023   PLT 190 04/28/2023   MCV 80 04/28/2023   MCH 26.5 (L) 04/28/2023   MCHC 33.1 04/28/2023   RDW 18.8 (H) 04/28/2023   LYMPHSABS 1,702 03/01/2023   MONOABS 0.6 04/14/2022   EOSABS 89 03/01/2023    BMET Lab Results  Component Value Date   NA 137 04/28/2023   K 4.6 04/28/2023   CL 101 04/28/2023   CO2 21 04/28/2023   GLUCOSE 105 (H) 04/28/2023   BUN 15 04/28/2023   CREATININE 1.11 04/28/2023   CALCIUM  9.2 04/28/2023   GFRNONAA >60 04/14/2022   GFRAA 74 05/27/2020    Lab Results  Component Value Date   ESRSEDRATE 2 09/13/2023   Lab Results  Component Value Date   CRP <3.0 09/13/2023     Assessment and Plan  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

## 2023-09-14 ENCOUNTER — Other Ambulatory Visit (HOSPITAL_COMMUNITY): Payer: Self-pay

## 2023-09-14 LAB — SEDIMENTATION RATE: Sed Rate: 2 mm/h (ref 0–20)

## 2023-09-14 LAB — C-REACTIVE PROTEIN: CRP: <3 mg/L (ref <8.0)

## 2023-09-20 ENCOUNTER — Other Ambulatory Visit (HOSPITAL_COMMUNITY): Payer: Self-pay

## 2023-09-20 ENCOUNTER — Other Ambulatory Visit: Payer: Self-pay

## 2023-09-22 ENCOUNTER — Other Ambulatory Visit (HOSPITAL_COMMUNITY): Payer: Self-pay

## 2023-09-26 ENCOUNTER — Other Ambulatory Visit: Payer: Self-pay | Admitting: Cardiovascular Disease

## 2023-09-26 ENCOUNTER — Other Ambulatory Visit: Payer: Self-pay | Admitting: Internal Medicine

## 2023-09-26 DIAGNOSIS — K219 Gastro-esophageal reflux disease without esophagitis: Secondary | ICD-10-CM

## 2023-09-27 ENCOUNTER — Other Ambulatory Visit: Payer: Self-pay

## 2023-09-27 ENCOUNTER — Other Ambulatory Visit (HOSPITAL_COMMUNITY): Payer: Self-pay

## 2023-09-28 ENCOUNTER — Encounter: Payer: Self-pay | Admitting: Internal Medicine

## 2023-09-28 ENCOUNTER — Other Ambulatory Visit (HOSPITAL_COMMUNITY): Payer: Self-pay

## 2023-09-28 MED ORDER — ENTRESTO 24-26 MG PO TABS
1.0000 | ORAL_TABLET | Freq: Two times a day (BID) | ORAL | 1 refills | Status: DC
  Filled 2023-09-28: qty 180, 90d supply, fill #0
  Filled 2023-12-22: qty 180, 90d supply, fill #1

## 2023-09-28 MED ORDER — PANTOPRAZOLE SODIUM 40 MG PO TBEC
40.0000 mg | DELAYED_RELEASE_TABLET | Freq: Every day | ORAL | 0 refills | Status: DC
  Filled 2023-09-28: qty 90, 90d supply, fill #0
  Filled 2023-12-22: qty 90, 90d supply, fill #1

## 2023-09-29 ENCOUNTER — Other Ambulatory Visit (HOSPITAL_COMMUNITY): Payer: Self-pay

## 2023-10-05 ENCOUNTER — Other Ambulatory Visit: Payer: Self-pay

## 2023-10-06 ENCOUNTER — Other Ambulatory Visit (HOSPITAL_COMMUNITY): Payer: Self-pay | Admitting: Pharmacy Technician

## 2023-10-06 ENCOUNTER — Other Ambulatory Visit (HOSPITAL_COMMUNITY): Payer: Self-pay

## 2023-10-06 NOTE — Progress Notes (Signed)
 Specialty Pharmacy Refill Coordination Note  Pilot Prindle. is a 63 y.o. male contacted today regarding refills of specialty medication(s) Ixekizumab  (Taltz )   Patient requested [name] at [facility] at [place] date: 10/13/23   Medication will be filled on 10/12/23.  Refill Request sent to MD then send to [facility] for re-write

## 2023-10-07 ENCOUNTER — Other Ambulatory Visit: Payer: Self-pay

## 2023-10-07 ENCOUNTER — Other Ambulatory Visit (HOSPITAL_COMMUNITY): Payer: Self-pay

## 2023-10-07 ENCOUNTER — Other Ambulatory Visit: Payer: Self-pay | Admitting: Pharmacist

## 2023-10-07 MED ORDER — TALTZ 80 MG/ML ~~LOC~~ SOAJ
SUBCUTANEOUS | 11 refills | Status: DC
  Filled 2023-10-07: qty 1, fill #0

## 2023-10-07 MED ORDER — TALTZ 80 MG/ML ~~LOC~~ SOAJ
SUBCUTANEOUS | 11 refills | Status: DC
  Filled 2023-10-07: qty 1, 28d supply, fill #0
  Filled 2023-11-08: qty 1, 28d supply, fill #1
  Filled 2023-11-30: qty 1, 28d supply, fill #2
  Filled 2023-12-27: qty 1, 28d supply, fill #3
  Filled 2024-01-22 – 2024-01-25 (×2): qty 1, 28d supply, fill #4
  Filled 2024-02-29: qty 1, 28d supply, fill #5
  Filled 2024-03-24: qty 1, 28d supply, fill #6
  Filled 2024-04-17: qty 1, 28d supply, fill #7
  Filled 2024-05-16: qty 1, 28d supply, fill #8
  Filled 2024-06-19: qty 1, 28d supply, fill #9
  Filled 2024-07-13: qty 1, 28d supply, fill #10
  Filled 2024-08-11: qty 1, 28d supply, fill #11

## 2023-10-08 ENCOUNTER — Other Ambulatory Visit: Payer: Self-pay

## 2023-10-10 ENCOUNTER — Other Ambulatory Visit: Payer: Self-pay

## 2023-10-11 ENCOUNTER — Other Ambulatory Visit: Payer: Self-pay

## 2023-10-11 ENCOUNTER — Other Ambulatory Visit (HOSPITAL_COMMUNITY): Payer: Self-pay

## 2023-10-11 DIAGNOSIS — E1121 Type 2 diabetes mellitus with diabetic nephropathy: Secondary | ICD-10-CM

## 2023-10-11 MED ORDER — MOUNJARO 10 MG/0.5ML ~~LOC~~ SOAJ
10.0000 mg | SUBCUTANEOUS | 3 refills | Status: DC
  Filled 2023-10-11: qty 2, 28d supply, fill #0
  Filled 2023-11-03: qty 2, 28d supply, fill #1
  Filled 2023-12-01: qty 2, 28d supply, fill #2
  Filled 2023-12-29: qty 2, 28d supply, fill #3

## 2023-10-11 MED ORDER — EMPAGLIFLOZIN 10 MG PO TABS
10.0000 mg | ORAL_TABLET | Freq: Every day | ORAL | 1 refills | Status: DC
  Filled 2023-10-11: qty 90, 90d supply, fill #0
  Filled 2024-01-04: qty 90, 90d supply, fill #1

## 2023-10-12 ENCOUNTER — Other Ambulatory Visit (HOSPITAL_COMMUNITY): Payer: Self-pay

## 2023-11-05 ENCOUNTER — Other Ambulatory Visit: Payer: Self-pay

## 2023-11-08 ENCOUNTER — Encounter: Payer: Self-pay | Admitting: Internal Medicine

## 2023-11-08 ENCOUNTER — Other Ambulatory Visit: Payer: Self-pay

## 2023-11-08 ENCOUNTER — Other Ambulatory Visit: Payer: Self-pay | Admitting: Internal Medicine

## 2023-11-08 DIAGNOSIS — M542 Cervicalgia: Secondary | ICD-10-CM

## 2023-11-08 NOTE — Progress Notes (Signed)
 Specialty Pharmacy Refill Coordination Note  [name]. is a 63 y.o. male contacted today regarding refills of specialty medication(s) Ixekizumab Altamease Oiler)   Patient requested [name] at [facility] at [place] date: 11/09/23   Medication will be filled on 11/08/23.

## 2023-11-10 ENCOUNTER — Other Ambulatory Visit (HOSPITAL_COMMUNITY): Payer: Self-pay

## 2023-11-17 ENCOUNTER — Other Ambulatory Visit (INDEPENDENT_AMBULATORY_CARE_PROVIDER_SITE_OTHER)

## 2023-11-17 ENCOUNTER — Ambulatory Visit: Admitting: Orthopedic Surgery

## 2023-11-17 DIAGNOSIS — M47812 Spondylosis without myelopathy or radiculopathy, cervical region: Secondary | ICD-10-CM

## 2023-11-17 DIAGNOSIS — M542 Cervicalgia: Secondary | ICD-10-CM | POA: Diagnosis not present
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
## 2023-11-22 ENCOUNTER — Inpatient Hospital Stay (HOSPITAL_COMMUNITY)

## 2023-11-22 ENCOUNTER — Encounter (HOSPITAL_COMMUNITY): Payer: Self-pay

## 2023-11-22 ENCOUNTER — Inpatient Hospital Stay (HOSPITAL_COMMUNITY)
Admission: AD | Admit: 2023-11-22 | Discharge: 2023-11-27 | DRG: 445 | Disposition: A | Discharge status: Home / Self Care | Source: Ambulatory Visit | Attending: Internal Medicine | Admitting: Internal Medicine

## 2023-11-22 ENCOUNTER — Other Ambulatory Visit: Payer: Self-pay

## 2023-11-22 ENCOUNTER — Ambulatory Visit: Admitting: Student

## 2023-11-22 ENCOUNTER — Encounter (HOSPITAL_COMMUNITY): Payer: Self-pay | Admitting: Internal Medicine

## 2023-11-22 ENCOUNTER — Ambulatory Visit (HOSPITAL_COMMUNITY)

## 2023-11-22 VITALS — BP 111/83 | HR 86 | Temp 97.7°F | Ht 70.0 in | Wt 254.3 lb

## 2023-11-22 DIAGNOSIS — K760 Fatty (change of) liver, not elsewhere classified: Secondary | ICD-10-CM | POA: Diagnosis present

## 2023-11-22 DIAGNOSIS — I110 Hypertensive heart disease with heart failure: Secondary | ICD-10-CM | POA: Diagnosis not present

## 2023-11-22 DIAGNOSIS — R17 Unspecified jaundice: Secondary | ICD-10-CM | POA: Diagnosis not present

## 2023-11-22 DIAGNOSIS — K8050 Calculus of bile duct without cholangitis or cholecystitis without obstruction: Secondary | ICD-10-CM | POA: Diagnosis not present

## 2023-11-22 DIAGNOSIS — K7460 Unspecified cirrhosis of liver: Secondary | ICD-10-CM | POA: Diagnosis not present

## 2023-11-22 DIAGNOSIS — Z79899 Other long term (current) drug therapy: Secondary | ICD-10-CM

## 2023-11-22 DIAGNOSIS — R932 Abnormal findings on diagnostic imaging of liver and biliary tract: Secondary | ICD-10-CM | POA: Diagnosis not present

## 2023-11-22 DIAGNOSIS — K3189 Other diseases of stomach and duodenum: Secondary | ICD-10-CM | POA: Diagnosis not present

## 2023-11-22 DIAGNOSIS — E785 Hyperlipidemia, unspecified: Secondary | ICD-10-CM | POA: Diagnosis present

## 2023-11-22 DIAGNOSIS — K2950 Unspecified chronic gastritis without bleeding: Secondary | ICD-10-CM | POA: Diagnosis not present

## 2023-11-22 DIAGNOSIS — Z792 Long term (current) use of antibiotics: Secondary | ICD-10-CM

## 2023-11-22 DIAGNOSIS — Z885 Allergy status to narcotic agent status: Secondary | ICD-10-CM

## 2023-11-22 DIAGNOSIS — K8067 Calculus of gallbladder and bile duct with acute and chronic cholecystitis with obstruction: Principal | ICD-10-CM | POA: Diagnosis present

## 2023-11-22 DIAGNOSIS — D45 Polycythemia vera: Secondary | ICD-10-CM | POA: Diagnosis present

## 2023-11-22 DIAGNOSIS — I10 Essential (primary) hypertension: Secondary | ICD-10-CM | POA: Diagnosis present

## 2023-11-22 DIAGNOSIS — I5032 Chronic diastolic (congestive) heart failure: Secondary | ICD-10-CM | POA: Diagnosis present

## 2023-11-22 DIAGNOSIS — I4821 Permanent atrial fibrillation: Secondary | ICD-10-CM | POA: Diagnosis not present

## 2023-11-22 DIAGNOSIS — E669 Obesity, unspecified: Secondary | ICD-10-CM | POA: Diagnosis present

## 2023-11-22 DIAGNOSIS — Z7901 Long term (current) use of anticoagulants: Secondary | ICD-10-CM

## 2023-11-22 DIAGNOSIS — K319 Disease of stomach and duodenum, unspecified: Secondary | ICD-10-CM | POA: Diagnosis present

## 2023-11-22 DIAGNOSIS — K8020 Calculus of gallbladder without cholecystitis without obstruction: Principal | ICD-10-CM

## 2023-11-22 DIAGNOSIS — Z7985 Long-term (current) use of injectable non-insulin antidiabetic drugs: Secondary | ICD-10-CM

## 2023-11-22 DIAGNOSIS — D751 Secondary polycythemia: Secondary | ICD-10-CM | POA: Diagnosis present

## 2023-11-22 DIAGNOSIS — R161 Splenomegaly, not elsewhere classified: Secondary | ICD-10-CM | POA: Diagnosis not present

## 2023-11-22 DIAGNOSIS — G4733 Obstructive sleep apnea (adult) (pediatric): Secondary | ICD-10-CM | POA: Diagnosis not present

## 2023-11-22 DIAGNOSIS — I5042 Chronic combined systolic (congestive) and diastolic (congestive) heart failure: Secondary | ICD-10-CM | POA: Diagnosis present

## 2023-11-22 DIAGNOSIS — K831 Obstruction of bile duct: Secondary | ICD-10-CM | POA: Diagnosis not present

## 2023-11-22 DIAGNOSIS — M00061 Staphylococcal arthritis, right knee: Secondary | ICD-10-CM | POA: Diagnosis present

## 2023-11-22 DIAGNOSIS — E119 Type 2 diabetes mellitus without complications: Secondary | ICD-10-CM | POA: Diagnosis not present

## 2023-11-22 DIAGNOSIS — D179 Benign lipomatous neoplasm, unspecified: Secondary | ICD-10-CM | POA: Diagnosis not present

## 2023-11-22 DIAGNOSIS — K2970 Gastritis, unspecified, without bleeding: Secondary | ICD-10-CM | POA: Diagnosis present

## 2023-11-22 DIAGNOSIS — Z8673 Personal history of transient ischemic attack (TIA), and cerebral infarction without residual deficits: Secondary | ICD-10-CM

## 2023-11-22 DIAGNOSIS — Z96653 Presence of artificial knee joint, bilateral: Secondary | ICD-10-CM | POA: Diagnosis present

## 2023-11-22 DIAGNOSIS — Z96642 Presence of left artificial hip joint: Secondary | ICD-10-CM | POA: Diagnosis present

## 2023-11-22 DIAGNOSIS — Z8619 Personal history of other infectious and parasitic diseases: Secondary | ICD-10-CM | POA: Diagnosis not present

## 2023-11-22 DIAGNOSIS — K219 Gastro-esophageal reflux disease without esophagitis: Secondary | ICD-10-CM | POA: Diagnosis present

## 2023-11-22 DIAGNOSIS — N179 Acute kidney failure, unspecified: Secondary | ICD-10-CM | POA: Diagnosis not present

## 2023-11-22 DIAGNOSIS — Z4682 Encounter for fitting and adjustment of non-vascular catheter: Secondary | ICD-10-CM | POA: Diagnosis not present

## 2023-11-22 DIAGNOSIS — R160 Hepatomegaly, not elsewhere classified: Secondary | ICD-10-CM | POA: Diagnosis not present

## 2023-11-22 DIAGNOSIS — R1011 Right upper quadrant pain: Secondary | ICD-10-CM | POA: Diagnosis not present

## 2023-11-22 DIAGNOSIS — K8309 Other cholangitis: Secondary | ICD-10-CM | POA: Insufficient documentation

## 2023-11-22 DIAGNOSIS — Z7984 Long term (current) use of oral hypoglycemic drugs: Secondary | ICD-10-CM | POA: Diagnosis not present

## 2023-11-22 DIAGNOSIS — R079 Chest pain, unspecified: Secondary | ICD-10-CM

## 2023-11-22 DIAGNOSIS — R233 Spontaneous ecchymoses: Secondary | ICD-10-CM | POA: Diagnosis present

## 2023-11-22 DIAGNOSIS — K8031 Calculus of bile duct with cholangitis, unspecified, with obstruction: Secondary | ICD-10-CM | POA: Diagnosis not present

## 2023-11-22 DIAGNOSIS — K838 Other specified diseases of biliary tract: Secondary | ICD-10-CM | POA: Diagnosis not present

## 2023-11-22 DIAGNOSIS — I5030 Unspecified diastolic (congestive) heart failure: Secondary | ICD-10-CM | POA: Diagnosis present

## 2023-11-22 DIAGNOSIS — K8001 Calculus of gallbladder with acute cholecystitis with obstruction: Secondary | ICD-10-CM | POA: Diagnosis not present

## 2023-11-22 LAB — COMPREHENSIVE METABOLIC PANEL
ALT: 351 U/L — ABNORMAL HIGH (ref 0–44)
AST: 317 U/L — ABNORMAL HIGH (ref 15–41)
Albumin: 3.5 g/dL (ref 3.5–5.0)
Alkaline Phosphatase: 141 U/L — ABNORMAL HIGH (ref 38–126)
Anion gap: 10 (ref 5–15)
BUN: 11 mg/dL (ref 8–23)
CO2: 21 mmol/L — ABNORMAL LOW (ref 22–32)
Calcium: 9.5 mg/dL (ref 8.9–10.3)
Chloride: 103 mmol/L (ref 98–111)
Creatinine, Ser: 1.28 mg/dL — ABNORMAL HIGH (ref 0.61–1.24)
GFR, Estimated: >60 mL/min (ref >60)
Glucose, Bld: 107 mg/dL — ABNORMAL HIGH (ref 70–99)
Potassium: 3.7 mmol/L (ref 3.5–5.1)
Sodium: 134 mmol/L — ABNORMAL LOW (ref 135–145)
Total Bilirubin: 10.6 mg/dL — ABNORMAL HIGH (ref 0.0–1.2)
Total Protein: 6.4 g/dL — ABNORMAL LOW (ref 6.5–8.1)

## 2023-11-22 LAB — CBC
HCT: 52.7 % — ABNORMAL HIGH (ref 39.0–52.0)
Hemoglobin: 18.3 g/dL — ABNORMAL HIGH (ref 13.0–17.0)
MCH: 28.1 pg (ref 26.0–34.0)
MCHC: 34.7 g/dL (ref 30.0–36.0)
MCV: 80.8 fL (ref 80.0–100.0)
Platelets: 166 10*3/uL (ref 150–400)
RBC: 6.52 MIL/uL — ABNORMAL HIGH (ref 4.22–5.81)
RDW: 17.9 % — ABNORMAL HIGH (ref 11.5–15.5)
WBC: 5.6 10*3/uL (ref 4.0–10.5)
nRBC: 0 % (ref 0.0–0.2)

## 2023-11-22 LAB — LIPASE, BLOOD: Lipase: 36 U/L (ref 11–51)

## 2023-11-22 MED ORDER — SACUBITRIL-VALSARTAN 24-26 MG PO TABS
1.0000 | ORAL_TABLET | Freq: Two times a day (BID) | ORAL | Status: DC
  Administered 2023-11-22 – 2023-11-26 (×9): 1 via ORAL
  Filled 2023-11-22 (×9): qty 1

## 2023-11-22 MED ORDER — ONDANSETRON HCL 4 MG/2ML IJ SOLN
4.0000 mg | Freq: Four times a day (QID) | INTRAMUSCULAR | Status: DC | PRN
  Administered 2023-11-25: 4 mg via INTRAVENOUS

## 2023-11-22 MED ORDER — HYDROMORPHONE HCL 1 MG/ML IJ SOLN: 0.5000 mg | INTRAMUSCULAR | Status: DC | PRN

## 2023-11-22 MED ORDER — GADOBUTROL 1 MMOL/ML IV SOLN
5.0000 mL | Freq: Once | INTRAVENOUS | Status: AC | PRN
  Administered 2023-11-22: 5 mL via INTRAVENOUS

## 2023-11-22 MED ORDER — CEFADROXIL 500 MG PO CAPS
500.0000 mg | ORAL_CAPSULE | Freq: Two times a day (BID) | ORAL | Status: DC
  Administered 2023-11-22 – 2023-11-24 (×4): 500 mg via ORAL
  Filled 2023-11-22 (×4): qty 1

## 2023-11-22 MED ORDER — METHOCARBAMOL 500 MG PO TABS
500.0000 mg | ORAL_TABLET | Freq: Four times a day (QID) | ORAL | Status: DC | PRN
  Administered 2023-11-23 – 2023-11-25 (×6): 500 mg via ORAL
  Filled 2023-11-22 (×6): qty 1

## 2023-11-22 MED ORDER — ACETAMINOPHEN 500 MG PO TABS
500.0000 mg | ORAL_TABLET | Freq: Four times a day (QID) | ORAL | Status: DC
  Administered 2023-11-22 – 2023-11-27 (×14): 500 mg via ORAL
  Filled 2023-11-22 (×17): qty 1

## 2023-11-22 MED ORDER — HYDROMORPHONE HCL 1 MG/ML PO LIQD
0.5000 mg | ORAL | Status: DC | PRN
  Administered 2023-11-23 – 2023-11-24 (×4): 0.5 mg via ORAL
  Filled 2023-11-22 (×3): qty 1
  Filled 2023-11-22: qty 0.5

## 2023-11-22 MED ORDER — LACTATED RINGERS IV SOLN: INTRAVENOUS | Status: AC

## 2023-11-22 MED ORDER — PANTOPRAZOLE SODIUM 40 MG PO TBEC
40.0000 mg | DELAYED_RELEASE_TABLET | Freq: Every day | ORAL | Status: DC
  Administered 2023-11-23 – 2023-11-26 (×4): 40 mg via ORAL
  Filled 2023-11-22 (×4): qty 1

## 2023-11-22 MED ORDER — POLYETHYLENE GLYCOL 3350 17 G PO PACK: 17.0000 g | PACK | Freq: Every day | ORAL | Status: DC | PRN

## 2023-11-22 MED ORDER — ONDANSETRON HCL 4 MG PO TABS: 4.0000 mg | ORAL_TABLET | Freq: Four times a day (QID) | ORAL | Status: DC | PRN

## 2023-11-22 NOTE — Assessment & Plan Note (Signed)
 Complicated by hardware infection with MSSA.recently seen by Dr. Ilsa Iha in the infectious disease clinic, cefadroxil was reduced from 1000 mg twice daily to 500 mg twice daily.  Additionally he takes Augmentin twice daily for chronic infection suppression.

## 2023-11-22 NOTE — Progress Notes (Signed)
 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  He was seen in the clinic today for an acute visit due to abdominal pain and fatigue.  Medications: Augmentin Cefadoxil Coreg Empagalfozin Xarelto Entresto Crestor   Past Medical History:  Diagnosis Date   Arthritis    Chronic systolic heart failure (HCC) 09/27/2019   Diabetes mellitus without complication (HCC)    TYPE 2   Diverticulosis    Dizziness 11/11/2022   He was at his baseline state of health until this Sunday, when he experienced dizziness at church. He felt "off" with blurred vision, and went to wait in the car, but was well enough to drive home. On Monday, he had a similar episode, and had to leave work early, which is very atypical. His dizziness is associated with fatigue and generally not feeling well. It comes and goes, but he hasn't identi   Dyslipidemia    GERD (gastroesophageal reflux disease)    Herpes labialis    Hypertension    Longstanding persistent atrial fibrillation (HCC)    Metabolic syndrome    Obesity    Proteinuria    Psoriasis    S/P left TKA 06/28/2018   S/P TKR (total knee replacement), right 05/31/2018   Seborrheic dermatitis    Sleep apnea    very compliant with CPAP, PT NEEDS TO BRING OWN MACHINE   TIA (transient ischemic attack) 2009    Current Outpatient Medications on File Prior to Visit  Medication Sig Dispense Refill   acetaminophen (TYLENOL) 500 MG tablet Take 500 mg by mouth every 6 (six) hours as needed.     amoxicillin-clavulanate (AUGMENTIN) 875-125 MG tablet Take 1 tablet by mouth 2 (two) times daily. (Patient not taking: Reported on 09/13/2023) 20 tablet 0   benzonatate (TESSALON) 100 MG capsule Take 1-2 capsules (100-200 mg total) by mouth 3  (three) times daily as needed. (Patient not taking: Reported on 09/13/2023) 30 capsule 0   carvedilol (COREG) 12.5 MG tablet Take 1 tablet (12.5 mg total) by mouth 2 (two) times daily. 180 tablet 3   cefadroxil (DURICEF) 500 MG capsule Take 1 capsule (500 mg total) by mouth 2 (two) times daily. 60 capsule 11   clobetasol cream (TEMOVATE) 0.05 % Apply topically to affected area 2 (two) times daily. 30 g 2   empagliflozin (JARDIANCE) 10 MG TABS tablet Take 1 tablet (10 mg total) by mouth daily. 90 tablet 1   methocarbamol (ROBAXIN) 500 MG tablet Take 1 tablet (500 mg total) by mouth every 6 (six) hours as needed for muscle spasms. 40 tablet 0   Multiple Vitamin (MULTIVITAMIN WITH MINERALS) TABS tablet Take 1 tablet by mouth daily.     Oxymetazoline HCl (NASAL SPRAY) 0.05 % SOLN Place into the nose.     pantoprazole (PROTONIX) 40 MG tablet Take 1 tablet (40 mg total) by mouth daily. 180 tablet 0   rivaroxaban (XARELTO) 20 MG TABS tablet Take 1 tablet (20 mg total) by mouth daily with supper. 90 tablet 1   rosuvastatin (CRESTOR) 10 MG tablet Take 1 tablet (10 mg total) by mouth daily. 90 tablet 1   sacubitril-valsartan (ENTRESTO) 24-26 MG Take 1 tablet by mouth 2 (two) times daily. 180 tablet 1   TALTZ 80 MG/ML pen Inject 1 ml (80mg ) under the skin  every 4 weeks. 1 mL 11   tirzepatide (MOUNJARO) 10 MG/0.5ML Pen Inject 10 mg into the skin once a week. 2 mL 3   Vitamin D, Cholecalciferol, 1000 units CAPS Take 1,000 Units by mouth daily.      No current facility-administered medications on file prior to visit.    Review of Systems: Please see assessment and plan for pertinent positives and negatives.  Objective:   Vitals:   11/22/23 1522  BP: 111/83  Pulse: 86  Temp: 97.7 F (36.5 C)  TempSrc: Oral  Weight: 254 lb 4.8 oz (115.3 kg)  Height: 5\' 10"  (1.778 m)    Physical Exam: Constitutional: Well-appearing, uncomfortable at times. Jaundice and ruddy complexion Eyes: Scleral icterus  present, equal round and reactive to light. Cardiovascular: Irregular rate and rhythm Pulmonary/Chest: lungs clear to auscultation bilaterally Abdominal: Epigastric and right upper quadrant pain.  Positive Murphy sign.  No rebound, no guarding. MSK: No significant pain, swelling, or erythema of the right knee. Extremities: Trace edema of the lower extremities bilaterally to mid shin. Psych: Very pleasant affect Thought process is linear and is goal-directed.     Assessment & Plan:  Jaundice Patient reports over the last 3 days he has had anorexia, bloating, chills, and belching.  He does have significant epigastric and right upper quadrant pain during this time as well.  He is joined by his wife, who states she has taken his temperature and he has not had any fevers.  Denies night sweats, chest pain, shortness of breath.  He says this pain is different than his normal GERD pain.  He also notes that his stools have been acholic, and his urine has been darker than usual.  He does have a prior right upper quadrant ultrasound showing gallstones.  On exam today he is jaundiced with scleral icterus.  He has significant epigastric and right upper quadrant pain with a positive Murphy sign.  No generalized abdominal pain or guarding to suggest an acute abdomen.  Given his right upper quadrant pain, positive Murphy sign, and jaundice there is high suspicion for cholangitis/choledocholithiasis.  The decision was made to directly admit the patient to these concerns.  Stat CMP and CBC were obtained which demonstrated greatly elevated bilirubin as well as increased alk phos, AST and ALT. He will be evaluated by gastroenterology for further workup/possible intervention.   Polycythemia vera (HCC) Negative for JAK2 mutation in the past, had been undergoing phlebotomies to lower his hematocrit.  He states he has not undergone phlebotomy in some time.  He does have a ruddy complexion, hemoglobin elevated today on  lab work. May benefit from phlebotomy following admission.   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 Ilsa Iha in the infectious disease clinic, cefadroxil was reduced from 1000 mg twice daily to 500 mg twice daily.  Additionally he takes Augmentin twice daily for chronic infection suppression.   Patient seen with Dr. Willow Ora MD [facility]  PGY-1 Pager: [phone]  Phone: [phone] Date 11/22/2023  Time 5:40 PM

## 2023-11-22 NOTE — Assessment & Plan Note (Addendum)
 Patient reports over the last 3 days he has had anorexia, bloating, chills, and belching.  He does have significant epigastric and right upper quadrant pain during this time as well.  He is joined by his wife, who states she has taken his temperature and he has not had any fevers.  Denies night sweats, chest pain, shortness of breath.  He says this pain is different than his normal GERD pain.  He also notes that his stools have been acholic, and his urine has been darker than usual.  He does have a prior right upper quadrant ultrasound showing gallstones.  On exam today he is jaundiced with scleral icterus.  He has significant epigastric and right upper quadrant pain with a positive Murphy sign.  No generalized abdominal pain or guarding to suggest an acute abdomen.  Given his right upper quadrant pain, positive Murphy sign, and jaundice there is high suspicion for cholangitis/choledocholithiasis.  The decision was made to directly admit the patient to these concerns.  Stat CMP and CBC were obtained which demonstrated greatly elevated bilirubin as well as increased alk phos, AST and ALT. He will be evaluated by gastroenterology for further workup/possible intervention.

## 2023-11-22 NOTE — Assessment & Plan Note (Addendum)
 Negative for JAK2 mutation in the past, had been undergoing phlebotomies to lower his hematocrit.  He states he has not undergone phlebotomy in some time.  He does have a ruddy complexion, hemoglobin elevated today on lab work. May benefit from phlebotomy following admission.

## 2023-11-22 NOTE — H&P (Cosign Needed Addendum)
 Date: 11/22/2023               Patient Name:  [name]. MRN: [id number]  DOB: 11-28-1960 Age / Sex: 63 y.o., male   PCP: Mercie Eon, MD         Medical Service: Internal Medicine Teaching Service         Attending Physician: Dr. Reymundo Poll, MD      First Contact: Dr. Jeral Pinch, DO Pager [phone]    Second Contact: Dr. Modena Slater, DO Pager [phone]         After Hours (After 5p/  First Contact Pager: [phone]  weekends / holidays): Second Contact Pager: [phone]   SUBJECTIVE   Chief Complaint: Belching  History of Present Illness:   [name]. is a 63 y.o. with a pertinent PMH of Type 2 diabetes, OSA, GERD, polycythemia vera, negative JAK2 mutation, Permanent A-fib, HFpEF, prosthetic joint infection right knee chronic antibiotic suppression with cefadroxil, hypertension, fatigue presented to IMTS clinic for abdominal pain, chills, belching, chest pain admitted for possible choledocholithiasis.  Over the past 2 months he notes that he would have chest pains, belch and then it would resolved. It would wake him up out of his sleep. He thought that this was all related to gas. He states Friday, he had a Gyro and lots of tatziki sauce. Friday night started to have abdominal pain and was feeling "Yucky". He described his abdominal tenderness as sore and bloated. He states did not eat anything Saturday morning. He states that in the afternoon, he had some english muffins and did not taste good for him. Reported decrease PO intake on Sunday. He reports having ongoing pain. He states he has 8 knee replacements. He states that he has a very high tolerance for pain but he states that this is something that is more painful than he has felt. Denies any fevers but he feels chills at night.  Reports he had an episode of bilious emesis. He states he has regular bowel movements. He states that the frequency and consistency is the same of his stool but he states that the color  of his stool is now absent, it is pale and whiter in color. He notes new dark color to his urine.   Otherwise he denies any symptoms of recent illness such as cough, congestion, sore throat.  Denies any recent abdominal surgeries.  Past Medical History: Past Medical History:  Diagnosis Date   Arthritis    Chronic systolic heart failure (HCC) 09/27/2019   Diabetes mellitus without complication (HCC)    TYPE 2   Diverticulosis    Dizziness 11/11/2022   He was at his baseline state of health until this Sunday, when he experienced dizziness at church. He felt "off" with blurred vision, and went to wait in the car, but was well enough to drive home. On Monday, he had a similar episode, and had to leave work early, which is very atypical. His dizziness is associated with fatigue and generally not feeling well. It comes and goes, but he hasn't identi   Dyslipidemia    GERD (gastroesophageal reflux disease)    Herpes labialis    Hypertension    Longstanding persistent atrial fibrillation (HCC)    Metabolic syndrome    Obesity    Proteinuria    Psoriasis    S/P left TKA 06/28/2018   S/P TKR (total knee replacement), right 05/31/2018   Seborrheic dermatitis    Sleep apnea  very compliant with CPAP, PT NEEDS TO BRING OWN MACHINE   TIA (transient ischemic attack) 2009    Meds:  Current Outpatient Medications  Medication Instructions   acetaminophen (TYLENOL) 500 mg, Every 6 hours PRN   amoxicillin-clavulanate (AUGMENTIN) 875-125 MG tablet 1 tablet, Oral, 2 times daily   benzonatate (TESSALON) 100-200 mg, Oral, 3 times daily PRN   carvedilol (COREG) 12.5 mg, Oral, 2 times daily   cefadroxil (DURICEF) 500 mg, Oral, 2 times daily   clobetasol cream (TEMOVATE) 0.05 % Apply topically to affected area 2 (two) times daily.   Jardiance 10 mg, Oral, Daily   methocarbamol (ROBAXIN) 500 mg, Oral, Every 6 hours PRN   Mounjaro 10 mg, Subcutaneous, Weekly   Multiple Vitamin (MULTIVITAMIN WITH  MINERALS) TABS tablet 1 tablet, Daily   Oxymetazoline HCl (NASAL SPRAY) 0.05 % SOLN Place into the nose.   pantoprazole (PROTONIX) 40 mg, Oral, Daily   rosuvastatin (CRESTOR) 10 mg, Oral, Daily   sacubitril-valsartan (ENTRESTO) 24-26 MG 1 tablet, Oral, 2 times daily   TALTZ 80 MG/ML pen Inject 1 ml (80mg ) under the skin every 4 weeks.   Vitamin D (Cholecalciferol) 1,000 Units, Daily   Xarelto 20 mg, Oral, Daily with supper    Past Surgical History:  Procedure Laterality Date   CARDIOVERSION N/A 12/02/2016   Procedure: CARDIOVERSION;  Surgeon: Chrystie Nose, MD;  Location: [facility] ENDOSCOPY;  Service: Cardiovascular;  Laterality: N/A;   COLONOSCOPY     EXCISIONAL TOTAL KNEE ARTHROPLASTY WITH ANTIBIOTIC SPACERS Right 03/28/2019   Procedure: EXCISIONAL TOTAL KNEE ARTHROPLASTY WITH ANTIBIOTIC SPACERS;  Surgeon: Durene Romans, MD;  Location: [facility] ORS;  Service: Orthopedics;  Laterality: Right;  90 mins   I & D KNEE WITH POLY EXCHANGE Right 09/10/2018   Procedure: Right Knee Arthroplasty IRRIGATION AND DEBRIDEMENT KNEE WITH POLY EXCHANGE;  Surgeon: Durene Romans, MD;  Location: [facility] ORS;  Service: Orthopedics;  Laterality: Right;   I & D KNEE WITH POLY EXCHANGE Right 06/25/2020   Procedure: Open excisional and non excisional debridement right knee, possible aspiration versus open arthrotomy poly exchange;  Surgeon: Durene Romans, MD;  Location: [facility] ORS;  Service: Orthopedics;  Laterality: Right;   IRRIGATION AND DEBRIDEMENT KNEE Right 07/09/2020   Procedure: IRRIGATION AND DEBRIDEMENT KNEE;  Surgeon: Durene Romans, MD;  Location: [facility] ORS;  Service: Orthopedics;  Laterality: Right;   JOINT REPLACEMENT  11/03/10   LT HIP   PFO occluder cardiac valve  2006   Dr. Jacinto Halim, (hole in heart)   REIMPLANTATION OF TOTAL KNEE Right 06/15/2019   Procedure: Resection of antibiotic spacer and  irrigation and debridment and placement of new antibiotic spacer and components;  Surgeon: Durene Romans, MD;  Location: [facility] ORS;   Service: Orthopedics;  Laterality: Right;  90 mins   REIMPLANTATION OF TOTAL KNEE Right 08/22/2019   Procedure: REIMPLANTATION OF TOTAL KNEE;  Surgeon: Durene Romans, MD;  Location: [facility]S;  Service: Orthopedics;  Laterality: Right;  120 mins   SKIN BIOPSY Left 12/22/2019   epidermal inclusion cyst   TONSILLECTOMY AND ADENOIDECTOMY     TOTAL HIP ARTHROPLASTY Left    TOTAL KNEE ARTHROPLASTY Right 05/31/2018   Procedure: RIGHT TOTAL KNEE ARTHROPLASTY;  Surgeon: Durene Romans, MD;  Location: [facility] ORS;  Service: Orthopedics;  Laterality: Right;  70 mins   TOTAL KNEE ARTHROPLASTY Left 06/28/2018   Procedure: LEFT TOTAL KNEE ARTHROPLASTY;  Surgeon: Durene Romans, MD;  Location: [facility] ORS;  Service: Orthopedics;  Laterality: Left;  70 mins   WISDOM TOOTH  EXTRACTION      Social:  Living Situation: Wife at home, and 2 labs  Functionality: Independent in all ADLs and iADLs  Support: Good support system Occupation: Psychologist, counselling  PCP: Mercie Eon, MD Tobacco: Never  Alcohol: Infrequent use, 2x a year usually   Drugs: Never   Family History:   Father: Had a cholecystectomy, Base of tongue cancer  Mother: Uterine cancer or some form of abdominal primary cancer, colon, ovarian, uterine   Allergies: Allergies as of 11/22/2023 - Review Complete 11/22/2023  Allergen Reaction Noted   Morphine and codeine Other (See Comments) 05/31/2018    Review of Systems: A complete ROS was negative except as per HPI.   OBJECTIVE:   Physical Exam: Wt Readings from Last 3 Encounters:  11/22/23 112.3 kg  11/22/23 115.3 kg  09/13/23 119.3 kg   Temp Readings from Last 3 Encounters:  11/22/23 98.5 F (36.9 C) (Oral)  11/22/23 97.7 F (36.5 C) (Oral)  06/09/23 97.8 F (36.6 C) (Oral)   BP Readings from Last 3 Encounters:  11/22/23 115/82  11/22/23 111/83  09/13/23 132/78   Pulse Readings from Last 3 Encounters:  11/22/23 91  11/22/23 86  09/13/23 78      Constitutional: Appears juandice,  laying in bed, no acute distress HENT: normocephalic atraumatic, mucous membranes moist, + icteric base of tongue  Eyes: + Icteric sclera  Cardiovascular: regular rate and rhythm, no m/r/g Pulmonary/Chest: normal work of breathing on room air, lungs clear to auscultation bilaterally Abdominal: distended, obese, TTP RUQ and epigastric region   MSK: normal bulk and tone, lower extremity edema bilaterally Neurological: alert & oriented x 4, answering questions appropriately Skin: warm and dry Psych: Pleasant  Labs: CBC    Component Value Date/Time   WBC 5.6 11/22/2023 1615   RBC 6.52 (H) 11/22/2023 1615   HGB 18.3 (H) 11/22/2023 1615   HGB 17.5 04/28/2023 1114   HGB 16.9 06/03/2016 1321   HCT 52.7 (H) 11/22/2023 1615   HCT 52.9 (H) 04/28/2023 1114   HCT 46.3 06/03/2016 1321   PLT 166 11/22/2023 1615   PLT 190 04/28/2023 1114   MCV 80.8 11/22/2023 1615   MCV 80 04/28/2023 1114   MCV 81.8 06/03/2016 1321   MCH 28.1 11/22/2023 1615   MCHC 34.7 11/22/2023 1615   RDW 17.9 (H) 11/22/2023 1615   RDW 18.8 (H) 04/28/2023 1114   RDW 14.6 06/03/2016 1321   LYMPHSABS 1,702 03/01/2023 0923   LYMPHSABS 1.6 11/25/2020 1004   LYMPHSABS 2.9 06/03/2016 1321   MONOABS 0.6 04/14/2022 1320   MONOABS 0.7 06/03/2016 1321   EOSABS 89 03/01/2023 0923   EOSABS 0.1 11/25/2020 1004   BASOSABS 37 03/01/2023 0923   BASOSABS 0.0 11/25/2020 1004   BASOSABS 0.0 06/03/2016 1321     CMP     Component Value Date/Time   NA 134 (L) 11/22/2023 1615   NA 137 04/28/2023 1114   NA 138 05/22/2015 1303   K 3.7 11/22/2023 1615   K 4.1 05/22/2015 1303   CL 103 11/22/2023 1615   CO2 21 (L) 11/22/2023 1615   CO2 23 05/22/2015 1303   GLUCOSE 107 (H) 11/22/2023 1615   GLUCOSE 162 (H) 05/22/2015 1303   BUN 11 11/22/2023 1615   BUN 15 04/28/2023 1114   BUN 17.4 05/22/2015 1303   CREATININE 1.28 (H) 11/22/2023 1615   CREATININE 1.20 03/01/2023 0923   CREATININE 1.1 05/22/2015 1303   CALCIUM 9.5 11/22/2023  1615   CALCIUM 9.4 05/22/2015  1303   PROT 6.4 (L) 11/22/2023 1615   PROT 6.9 05/27/2020 0916   PROT 6.9 05/22/2015 1303   ALBUMIN 3.5 11/22/2023 1615   ALBUMIN 4.6 05/27/2020 0916   ALBUMIN 3.9 05/22/2015 1303   AST 317 (H) 11/22/2023 1615   AST 23 04/14/2022 1320   AST 24 05/22/2015 1303   ALT 351 (H) 11/22/2023 1615   ALT 21 04/14/2022 1320   ALT 30 05/22/2015 1303   ALKPHOS 141 (H) 11/22/2023 1615   ALKPHOS 73 05/22/2015 1303   BILITOT 10.6 (H) 11/22/2023 1615   BILITOT 1.2 04/14/2022 1320   BILITOT 0.77 05/22/2015 1303   GFRNONAA >60 11/22/2023 1615   GFRNONAA >60 04/14/2022 1320   GFRNONAA 66 11/24/2016 0946   GFRAA 74 05/27/2020 0916   GFRAA 76 11/24/2016 0946    Imaging: None   EKG: personally reviewed my interpretation is A-fib with premature ventricular or aberrantly conducted complexes, low voltage QRS simialr to prior EKG.   ASSESSMENT & PLAN:   Assessment & Plan by Problem: Principal Problem:   Choledocholithiasis   [name] is a 63 y.o. male with PMH of Type 2 diabetes, OSA, GERD, polycythemia vera, negative JAK2 mutation, Permanent A-fib, HFpEF, prosthetic R knee infection with MSSA on chronic antibiotic suppression with cefadroxil, hypertension, fatigue presented to IMTS clinic for abdominal pain, chills, belching, chest pain admitted for possible choledocholithiasis.  RUQ Pain Elevated LFTs Jaundice  Hx of Cholelithiasis  Presented to IMTS clinic with symptoms of RUQ, belching, chills, jaundice. Labs pertinent for AST 317, ALT 351, Alk Phos 141, T. Bili 10.6; had a previous RUQ [place] 04/09/2021 showed gallstones. Differentials highly concerning for include choledocholithiasis vs acute cholangitis vs pancreatitis. Lipase 36, so unlikely pancreatitis. No fevers, vital signs stable, AO x 4, presentation more consistent with choledocholithiasis. GI on board, ordered STAT MRCP to further evaluate stone in the CBD, and will see the need for ERCP tomorrow.   - Follow up MRCP findings - Follow up CMP AM - PRN Robaxin 500 mg q6h  - Tylenol 500 mg every 6 hours - PRN Dilaudid 0.5 mg every 4 hours for severe pain  - PRN Zofran q6h for nausea  - LR infusion 100 cc/hr for 10 hours   Permanent Afib - Hold home medication Xarelto for possible ERCP  T2DM Home medication  Mounjaro 10 mg weekly and Jardiance 10 mg daily.  - Hold Jardiance to optimize blood pressures   GERD - Continue home medication Protonix EC 40 mg daily   Polycythemia Vera JAK 2 negative  Patient is supposed to go for routine phlebotomy to keep HCT down, has not been going for a while. Hgb 18.3 and HCT 52.7, will need to address outpatient.   HFpEF [EF 55-60%] GDMT consists of Entresto 24-26 mg BID, Coreg 12.5 BID, and Jardiance 10 mg daily - Will continue Entresto 24-26 mg BID - Hold Coreg and Jardiance to optimize blood pressures   Hx of Prosthetic Joint infection with MSSA on chronic antibiotic suppression therapy  Follows ID clinic outpatient. Reports that he is not taking Augmentin. - Continue home medication Cefadroxil 500 mg BID   Diet: NPO VTE: SCDs IVF: LR,100cc/hr Code: Full  Prior to Admission Living Arrangement: Home, living wife Anticipated Discharge Location: Home Barriers to Discharge: medical management  Dispo: Admit patient to Inpatient with expected length of stay greater than 2 midnights.  Signed: Jeral Pinch, DO Internal Medicine Resident PGY-1  11/22/2023, 6:53 PM

## 2023-11-23 ENCOUNTER — Inpatient Hospital Stay (HOSPITAL_COMMUNITY)

## 2023-11-23 DIAGNOSIS — R17 Unspecified jaundice: Secondary | ICD-10-CM

## 2023-11-23 DIAGNOSIS — K8309 Other cholangitis: Secondary | ICD-10-CM | POA: Insufficient documentation

## 2023-11-23 DIAGNOSIS — I4821 Permanent atrial fibrillation: Secondary | ICD-10-CM | POA: Diagnosis not present

## 2023-11-23 DIAGNOSIS — Z8619 Personal history of other infectious and parasitic diseases: Secondary | ICD-10-CM | POA: Diagnosis not present

## 2023-11-23 DIAGNOSIS — R7989 Other specified abnormal findings of blood chemistry: Secondary | ICD-10-CM | POA: Insufficient documentation

## 2023-11-23 DIAGNOSIS — K8020 Calculus of gallbladder without cholecystitis without obstruction: Secondary | ICD-10-CM | POA: Diagnosis not present

## 2023-11-23 DIAGNOSIS — K7460 Unspecified cirrhosis of liver: Secondary | ICD-10-CM | POA: Diagnosis not present

## 2023-11-23 DIAGNOSIS — K831 Obstruction of bile duct: Secondary | ICD-10-CM | POA: Insufficient documentation

## 2023-11-23 DIAGNOSIS — I5032 Chronic diastolic (congestive) heart failure: Secondary | ICD-10-CM | POA: Diagnosis not present

## 2023-11-23 DIAGNOSIS — I5030 Unspecified diastolic (congestive) heart failure: Secondary | ICD-10-CM | POA: Diagnosis not present

## 2023-11-23 DIAGNOSIS — K8000 Calculus of gallbladder with acute cholecystitis without obstruction: Secondary | ICD-10-CM

## 2023-11-23 DIAGNOSIS — D45 Polycythemia vera: Secondary | ICD-10-CM | POA: Diagnosis not present

## 2023-11-23 HISTORY — DX: Unspecified jaundice: R17

## 2023-11-23 HISTORY — DX: Other cholangitis: K83.09

## 2023-11-23 HISTORY — DX: Calculus of gallbladder with acute cholecystitis without obstruction: K80.00

## 2023-11-23 LAB — COMPREHENSIVE METABOLIC PANEL
ALT: 298 U/L — ABNORMAL HIGH (ref 0–44)
AST: 229 U/L — ABNORMAL HIGH (ref 15–41)
Albumin: 3.1 g/dL — ABNORMAL LOW (ref 3.5–5.0)
Alkaline Phosphatase: 130 U/L — ABNORMAL HIGH (ref 38–126)
Anion gap: 9 (ref 5–15)
BUN: 9 mg/dL (ref 8–23)
CO2: 21 mmol/L — ABNORMAL LOW (ref 22–32)
Calcium: 8.4 mg/dL — ABNORMAL LOW (ref 8.9–10.3)
Chloride: 104 mmol/L (ref 98–111)
Creatinine, Ser: 1.21 mg/dL (ref 0.61–1.24)
GFR, Estimated: >60 mL/min (ref >60)
Glucose, Bld: 81 mg/dL (ref 70–99)
Potassium: 3.6 mmol/L (ref 3.5–5.1)
Sodium: 134 mmol/L — ABNORMAL LOW (ref 135–145)
Total Bilirubin: 10.2 mg/dL — ABNORMAL HIGH (ref 0.0–1.2)
Total Protein: 5.7 g/dL — ABNORMAL LOW (ref 6.5–8.1)

## 2023-11-23 LAB — HIV ANTIBODY (ROUTINE TESTING W REFLEX): HIV Screen 4th Generation wRfx: NONREACTIVE

## 2023-11-23 LAB — CBC
HCT: 49 % (ref 39.0–52.0)
Hemoglobin: 17.5 g/dL — ABNORMAL HIGH (ref 13.0–17.0)
MCH: 28.5 pg (ref 26.0–34.0)
MCHC: 35.7 g/dL (ref 30.0–36.0)
MCV: 79.9 fL — ABNORMAL LOW (ref 80.0–100.0)
Platelets: 146 10*3/uL — ABNORMAL LOW (ref 150–400)
RBC: 6.13 MIL/uL — ABNORMAL HIGH (ref 4.22–5.81)
RDW: 17.9 % — ABNORMAL HIGH (ref 11.5–15.5)
WBC: 4.8 10*3/uL (ref 4.0–10.5)
nRBC: 0 % (ref 0.0–0.2)

## 2023-11-23 LAB — HEPATITIS B SURFACE ANTIGEN: Hepatitis B Surface Ag: NONREACTIVE

## 2023-11-23 LAB — PROTIME-INR
INR: 1.6 — ABNORMAL HIGH (ref 0.8–1.2)
Prothrombin Time: 18.9 s — ABNORMAL HIGH (ref 11.4–15.2)

## 2023-11-23 LAB — HEPATITIS B SURFACE ANTIBODY,QUALITATIVE: Hep B S Ab: NONREACTIVE

## 2023-11-23 LAB — FERRITIN: Ferritin: 54 ng/mL (ref 24–336)

## 2023-11-23 MED ORDER — LACTATED RINGERS IV SOLN: INTRAVENOUS | Status: AC

## 2023-11-23 MED ORDER — PIPERACILLIN-TAZOBACTAM 3.375 G IVPB
3.3750 g | Freq: Three times a day (TID) | INTRAVENOUS | Status: DC
  Administered 2023-11-23 – 2023-11-27 (×11): 3.375 g via INTRAVENOUS
  Filled 2023-11-23 (×11): qty 50

## 2023-11-23 MED ORDER — GADOBUTROL 1 MMOL/ML IV SOLN: 5.0000 mL | Freq: Once | INTRAVENOUS | Status: DC | PRN

## 2023-11-23 MED ORDER — IOHEXOL 350 MG/ML SOLN
100.0000 mL | Freq: Once | INTRAVENOUS | Status: AC | PRN
  Administered 2023-11-23: 100 mL via INTRAVENOUS

## 2023-11-23 NOTE — Progress Notes (Addendum)
 HD#1 SUBJECTIVE:  Patient Summary: [name]. is a 63 y.o. with a pertinent PMH of Type 2 diabetes, OSA, GERD, polycythemia vera, negative JAK2 mutation, Permanent A-fib, HFpEF, prosthetic joint infection right knee chronic antibiotic suppression with cefadroxil, hypertension, fatigue presented to IMTS clinic for abdominal pain, chills, belching, chest pain admitted for juandice and Elevated LFTs   Overnight Events: None  Interim History: Patient is evaluated at bedside with wife present, reports improvement with pain. Denies any nausea or vomiting at this time. Reports seeing Gen Surg this morning with plans for abdominal CT scan. All questions answered presently.   OBJECTIVE:  Vital Signs: Vitals:   11/22/23 1830 11/22/23 1945 11/23/23 0600 11/23/23 0800  BP:  118/82 105/69 111/77  Pulse:  83 70 66  Resp:  18 18 18   Temp:  (!) 97.5 F (36.4 C) (!) 97.5 F (36.4 C)   TempSrc:  Oral Oral Oral  SpO2:  98% 98% 99%  Weight: 112.3 kg     Height:       Supplemental O2: Room Air SpO2: 99 %  Filed Weights   11/22/23 1830  Weight: 112.3 kg     Intake/Output Summary (Last 24 hours) at 11/23/2023 0944 Last data filed at 11/23/2023 0600 Gross per 24 hour  Intake --  Output 500 ml  Net -500 ml   Net IO Since Admission: -500 mL [11/23/23 0944]  Physical Exam: Physical Exam General: Laying in bed, no acute distress HEENT: AT/[place]/scleral icterus Cardiovascular: Regular rate, no murmurs appreciated Pulmonary: Breathing comfortably, no wheezing or crackles Abdomen: Obese, soft, nontender, nondistended, bowel sounds present MSK: No lower extremity edema bilaterally Skin: Jaundice  Patient Lines/Drains/Airways Status     Active Line/Drains/Airways     Name Placement date Placement time Site Days   Peripheral IV 11/22/23 20 G Left Antecubital 11/22/23  1900  Antecubital  1   Peripheral IV 20 G Anterior;Right Forearm --  --  Forearm  --   Closed System Drain Right Knee  Accordion (Hemovac) 10 Fr. 07/09/20  1345  Knee  1232             ASSESSMENT/PLAN:  Assessment: Principal Problem:   Elevated LFTs Active Problems:   Essential hypertension   Polycythemia vera (HCC)   Type 2 diabetes mellitus (HCC)   (HFpEF) heart failure with preserved ejection fraction (HCC)   Jaundice   Liver cirrhosis (HCC)   Calculus of gallbladder with acute cholecystitis   Total bilirubin, elevated  William Sill. is a 63 y.o. 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    Cholestasis with liver injury, possibly multifactorial MRCP with impacted cystic duct and compression of proximal CBD Enhancing gallbladder mass with invasion into adjacent liver Liver cirrhosis (newly diagnosed) Mild Splenomegaly Patient has markedly elevated T bili > 10, splenomegaly, liver cirrhosis and mild intrahepatic bile duct dilatation noted in the MRCP. The elevated LFTs and T bili elevation unlikely from acute cholecystitis alone. General Surgery on board, recommends further evaluation through imaging and GI consult prior to proceeding with surgery.  - Follow GI recommendations - Follow up CT Liver Abdomen w wo contrast - Follow up AM CMP - F/u Hepatitis serologies    Acute cholangitis Jaundice  MRCP findings impacted stone within  the distal cystic duct has mass effect upon the common bile duct.  Beyond this level the mid and distal common bile ducts are nondilated without signs of distal choledocholithiasis.  Enhancement of the wall of the cystic duct and common bile duct noted favoring cholangitis. There is also a concern for neoplasm of the gallbladder. General surgery consulted, appreciate their  assistance with this case. Presently, will need GI on board and recommend further evaluation as above.  - Currently on chronic oral antibiotic therapy (cefadroxil BID) and being followed by infectious disease for a chronic prosthetic knee infection. We have continued this for now. No signs of systmic sepsis, but low threshold to broaden to IV Zosyn if there is a clinical change.  - Follow-up CT Liver Abdomen w wo contrast  - Follow-up general surgery recommendations - PRN Robaxin 500 mg q6h  - Tylenol 500 mg every 6 hours - PRN Dilaudid 0.5 mg every 4 hours for severe pain  - PRN Zofran q6h for nausea   Permanent Afib - Rate controlled - Hold home medication Xarelto for possible procedure.    T2DM Home medication  Mounjaro 10 mg weekly and Jardiance 10 mg daily.  - Hold Jardiance to optimize blood pressures    GERD - Continue home medication Protonix EC 40 mg daily    Polycythemia Vera JAK 2 negative  Patient is supposed to go for routine phlebotomy to keep HCT down, has not been going for a while. Hgb 18.3 and HCT 52.7, will need to address outpatient.    HFpEF [EF 55-60%] GDMT consists of Entresto 24-26 mg BID, Coreg 12.5 BID, and Jardiance 10 mg daily - Will continue Entresto 24-26 mg BID - Hold Coreg and Jardiance to optimize blood pressures    Hx of Prosthetic Joint infection with MSSA on chronic antibiotic suppression therapy  Follows ID clinic outpatient. Reports that he is not taking Augmentin. - Continue home medication Cefadroxil 500 mg BID   Lipoma  - Incidental finding noted on MRCP at right posterior paraspinal musculature measures.  - Will need OP follow up   Best Practice: Diet: NPO with ice chips  IVF: LR 10 hours  VTE: SCDs Start: 11/22/23 1832 Code: Full AB: none  Wounds: none  Therapy Recs: pending  Family Contact: wife at bedside  DISPO: Anticipated discharge 2-3 days, will need evaluation by PT/OT   Signature: [name], D.O.  Internal  Medicine Resident, PGY-1 [facility]  Pager: [phone] 9:44 AM, 11/23/2023   Please contact the on call pager after 5 pm and on weekends at [phone].

## 2023-11-23 NOTE — Plan of Care (Signed)
   Problem: Elimination: Goal: Will not experience complications related to bowel motility Outcome: Progressing   Problem: Pain Managment: Goal: General experience of comfort will improve and/or be controlled Outcome: Progressing   Problem: Safety: Goal: Ability to remain free from injury will improve Outcome: Progressing

## 2023-11-23 NOTE — Consult Note (Signed)
 William Levins Jr. 11-28-1960  [id number].    Requesting MD: Guilloud Chief Complaint/Reason for Consult: Cystic duct stone, Cholangitis   HPI:  63 y/o M w/ a hx of DM2, OSA, GERD, Afib on Xarelto (last dose 3/23), HFpEF, HTN, bilateral knee replacements c/b infection s/p multiple surgeries currently on chronic suppressive antibiotics who presented to the hospital with abdominal pain, jaundice, and malaise that started on 3 days ago.  He reports that he started to experience some general malaise about 6 months ago, but it wasn't until a few days ago that he developed post-prandial abdominal pain that persisted throughout the weekend. He noted that his stools had also lightened and his urine was darkened.    Labs notable for a Tbili of >10, Alk phos 131, AST/ALT in the 200s.  WBC 5, platelets 146.   MRCP shows a stone impacted within the distal cystic duct w/ compression of the CBD and signs of cholangitis. There is evidence of cirrhosis as well as a soft tissue mass within the gallbladder and splenomegaly.    He had a colonoscopy 5 years ago that showed diverticulosis.   ROS: Review of Systems  Constitutional:  Positive for malaise/fatigue.  HENT: Negative.    Respiratory: Negative.    Cardiovascular: Negative.   Gastrointestinal:  Positive for abdominal pain and nausea.  Genitourinary: Negative.   Musculoskeletal: Negative.   Skin:        Jaundice  Neurological: Negative.   Endo/Heme/Allergies: Negative.   Psychiatric/Behavioral: Negative.      Family History  Problem Relation Age of Onset   Uterine cancer Mother 43       Cervical cath   Crohn's disease Mother    Esophageal cancer Father    Cancer Other    Obesity Other     Past Medical History:  Diagnosis Date   Arthritis    Chronic systolic heart failure (HCC) 09/27/2019   Diabetes mellitus without complication (HCC)    TYPE 2   Diverticulosis    Dizziness 11/11/2022   He was at his baseline state of health  until this Sunday, when he experienced dizziness at church. He felt "off" with blurred vision, and went to wait in the car, but was well enough to drive home. On Monday, he had a similar episode, and had to leave work early, which is very atypical. His dizziness is associated with fatigue and generally not feeling well. It comes and goes, but he hasn't identi   Dyslipidemia    GERD (gastroesophageal reflux disease)    Herpes labialis    Hypertension    Longstanding persistent atrial fibrillation (HCC)    Metabolic syndrome    Obesity    Proteinuria    Psoriasis    S/P left TKA 06/28/2018   S/P TKR (total knee replacement), right 05/31/2018   Seborrheic dermatitis    Sleep apnea    very compliant with CPAP, PT NEEDS TO BRING OWN MACHINE   TIA (transient ischemic attack) 2009    Past Surgical History:  Procedure Laterality Date   CARDIOVERSION N/A 12/02/2016   Procedure: CARDIOVERSION;  Surgeon: Chrystie Nose, MD;  Location: [facility] ENDOSCOPY;  Service: Cardiovascular;  Laterality: N/A;   COLONOSCOPY     EXCISIONAL TOTAL KNEE ARTHROPLASTY WITH ANTIBIOTIC SPACERS Right 03/28/2019   Procedure: EXCISIONAL TOTAL KNEE ARTHROPLASTY WITH ANTIBIOTIC SPACERS;  Surgeon: Durene Romans, MD;  Location: [facility] ORS;  Service: Orthopedics;  Laterality: Right;  90 mins   I & D KNEE WITH POLY  EXCHANGE Right 09/10/2018   Procedure: Right Knee Arthroplasty IRRIGATION AND DEBRIDEMENT KNEE WITH POLY EXCHANGE;  Surgeon: Durene Romans, MD;  Location: [facility] ORS;  Service: Orthopedics;  Laterality: Right;   I & D KNEE WITH POLY EXCHANGE Right 06/25/2020   Procedure: Open excisional and non excisional debridement right knee, possible aspiration versus open arthrotomy poly exchange;  Surgeon: Durene Romans, MD;  Location: [facility] ORS;  Service: Orthopedics;  Laterality: Right;   IRRIGATION AND DEBRIDEMENT KNEE Right 07/09/2020   Procedure: IRRIGATION AND DEBRIDEMENT KNEE;  Surgeon: Durene Romans, MD;  Location: [facility] ORS;  Service:  Orthopedics;  Laterality: Right;   JOINT REPLACEMENT  11/03/10   LT HIP   PFO occluder cardiac valve  2006   Dr. Jacinto Halim, (hole in heart)   REIMPLANTATION OF TOTAL KNEE Right 06/15/2019   Procedure: Resection of antibiotic spacer and  irrigation and debridment and placement of new antibiotic spacer and components;  Surgeon: Durene Romans, MD;  Location: [facility] ORS;  Service: Orthopedics;  Laterality: Right;  90 mins   REIMPLANTATION OF TOTAL KNEE Right 08/22/2019   Procedure: REIMPLANTATION OF TOTAL KNEE;  Surgeon: Durene Romans, MD;  Location: [facility];  Service: Orthopedics;  Laterality: Right;  120 mins   SKIN BIOPSY Left 12/22/2019   epidermal inclusion cyst   TONSILLECTOMY AND ADENOIDECTOMY     TOTAL HIP ARTHROPLASTY Left    TOTAL KNEE ARTHROPLASTY Right 05/31/2018   Procedure: RIGHT TOTAL KNEE ARTHROPLASTY;  Surgeon: Durene Romans, MD;  Location: [facility];  Service: Orthopedics;  Laterality: Right;  70 mins   TOTAL KNEE ARTHROPLASTY Left 06/28/2018   Procedure: LEFT TOTAL KNEE ARTHROPLASTY;  Surgeon: Durene Romans, MD;  Location: [facility];  Service: Orthopedics;  Laterality: Left;  70 mins   WISDOM TOOTH EXTRACTION      Social History:  reports that he has never smoked. He has never used smokeless tobacco. He reports current alcohol use. He reports that he does not use drugs.  Allergies:  Allergies  Allergen Reactions   Morphine And Codeine Other (See Comments)    Hallucinations    Medications Prior to Admission  Medication Sig Dispense Refill   acetaminophen (TYLENOL) 500 MG tablet Take 500 mg by mouth every 6 (six) hours as needed.     b complex vitamins capsule Take 1 capsule by mouth daily.     carvedilol (COREG) 12.5 MG tablet Take 1 tablet (12.5 mg total) by mouth 2 (two) times daily. 180 tablet 3   cefadroxil (DURICEF) 500 MG capsule Take 1 capsule (500 mg total) by mouth 2 (two) times daily. 60 capsule 11   empagliflozin (JARDIANCE) 10 MG TABS tablet Take 1 tablet (10 mg total) by  mouth daily. 90 tablet 1   methocarbamol (ROBAXIN) 500 MG tablet Take 1 tablet (500 mg total) by mouth every 6 (six) hours as needed for muscle spasms. 40 tablet 0   Multiple Vitamin (MULTIVITAMIN WITH MINERALS) TABS tablet Take 1 tablet by mouth daily.     Oxymetazoline HCl (NASAL SPRAY) 0.05 % SOLN Place into the nose.     pantoprazole (PROTONIX) 40 MG tablet Take 1 tablet (40 mg total) by mouth daily. 180 tablet 0   rivaroxaban (XARELTO) 20 MG TABS tablet Take 1 tablet (20 mg total) by mouth daily with supper. 90 tablet 1   rosuvastatin (CRESTOR) 10 MG tablet Take 1 tablet (10 mg total) by mouth daily. 90 tablet 1   sacubitril-valsartan (ENTRESTO) 24-26 MG Take 1 tablet by mouth 2 (two) times  daily. 180 tablet 1   TALTZ 80 MG/ML pen Inject 1 ml (80mg ) under the skin every 4 weeks. 1 mL 11   tirzepatide (MOUNJARO) 10 MG/0.5ML Pen Inject 10 mg into the skin once a week. 2 mL 3   Vitamin D, Cholecalciferol, 1000 units CAPS Take 1,000 Units by mouth daily.       Physical Exam: Blood pressure 111/77, pulse 66, temperature (!) 97.5 F (36.4 C), temperature source Oral, resp. rate 18, height 5\' 10"  (1.778 m), weight 112.3 kg, SpO2 99%. Gen: male, NAD Abd: soft, non-distended, mild TTP in the RUQ HEENT: scleral icterus   Results for orders placed or performed during the hospital encounter of 11/22/23 (from the past 48 hours)  Comprehensive metabolic panel     Status: Abnormal   Collection Time: 11/23/23  6:30 AM  Result Value Ref Range   Sodium 134 (L) 135 - 145 mmol/L   Potassium 3.6 3.5 - 5.1 mmol/L   Chloride 104 98 - 111 mmol/L   CO2 21 (L) 22 - 32 mmol/L   Glucose, Bld 81 70 - 99 mg/dL    Comment: Glucose reference range applies only to samples taken after fasting for at least 8 hours.   BUN 9 8 - 23 mg/dL   Creatinine, Ser 1.61 0.61 - 1.24 mg/dL   Calcium 8.4 (L) 8.9 - 10.3 mg/dL   Total Protein 5.7 (L) 6.5 - 8.1 g/dL   Albumin 3.1 (L) 3.5 - 5.0 g/dL   AST 096 (H) 15 - 41 U/L    ALT 298 (H) 0 - 44 U/L   Alkaline Phosphatase 130 (H) 38 - 126 U/L   Total Bilirubin 10.2 (H) 0.0 - 1.2 mg/dL   GFR, Estimated >04 >54 mL/min    Comment: (NOTE) Calculated using the CKD-EPI Creatinine Equation (2021)    Anion gap 9 5 - 15    Comment: Performed at [facility], [address], [place], [place] [address]  CBC     Status: Abnormal   Collection Time: 11/23/23  6:30 AM  Result Value Ref Range   WBC 4.8 4.0 - 10.5 K/uL   RBC 6.13 (H) 4.22 - 5.81 MIL/uL   Hemoglobin 17.5 (H) 13.0 - 17.0 g/dL   HCT 91.4 78.2 - 95.6 %   MCV 79.9 (L) 80.0 - 100.0 fL   MCH 28.5 26.0 - 34.0 pg   MCHC 35.7 30.0 - 36.0 g/dL   RDW 21.3 (H) 08.6 - 57.8 %   Platelets 146 (L) 150 - 400 K/uL   nRBC 0.0 0.0 - 0.2 %    Comment: Performed at [facility], [address], [place], [place] [address]   *Note: Due to a large number of results and/or encounters for the requested time period, some results have not been displayed. A complete set of results can be found in Results Review.   MR ABDOMEN WITH MRCP W CONTRAST Result Date: 11/23/2023 CLINICAL DATA:  Evaluate for choledocholithiasis. EXAM: MRI ABDOMEN WITHOUT AND WITH CONTRAST (INCLUDING MRCP) TECHNIQUE: Multiplanar multisequence MR imaging of the abdomen was performed both before and after the administration of intravenous contrast. Heavily T2-weighted images of the biliary and pancreatic ducts were obtained, and three-dimensional MRCP images were rendered by post processing. CONTRAST:  5mL GADAVIST GADOBUTROL 1 MMOL/ML IV SOLN COMPARISON:  None Available. FINDINGS: Lower chest: No acute findings. Hepatobiliary: Cirrhotic morphology of the liver is identified with marked enlargement of the caudate lobe. Contour the liver is nodular and  there is widening of the falciform ligament. No enhancing liver lesions identified. The gallbladder appears decompressed around small stones. Enhancing soft tissue within the gallbladder measures approximately 2.1  x 2.8 cm, image 53/12 and image 79/1102. This appears inseparable from the adjacent liver. Mild intrahepatic bile duct dilatation. Enhancement of the wall of the cystic duct and common bile duct noted. Cystic duct is dilated measuring 1 cm in diameter. Impacted stone within the distal cystic duct has mass effect upon the common bile duct. The stone measures 0.9 x 0.5 cm, image 21/3. Beyond this level the mid and distal common bile duct are nondilated without signs of choledocholithiasis. Pancreas: The pancreas appears edematous. No main duct dilatation or mass identified. No significant peripancreatic fat stranding or free fluid. Spleen: The spleen measures 13.4 cm in cranial caudal dimension. No focal splenic abnormality. Adrenals/Urinary Tract: No suspicious adrenal mass. No hydronephrosis. Small hemorrhagic cyst noted within the upper pole of the left kidney measuring 5 mm, Bosniak class 2. Additional, less than 1 cm T2 hyperintense kidney lesions are noted compatible with Bosniak class 2 lesions. No follow-up imaging recommended. Stomach/Bowel: Stomach appears normal. No bowel wall thickening, inflammation or distension identified. Vascular/Lymphatic: Normal caliber abdominal aorta. No upper abdominal adenopathy. Portal vein is patent. Other:  No significant ascites or focal fluid collections. Musculoskeletal: Lipoma within the right posterior paraspinal musculature measures 5.7 x 4.1 by 59.6 cm. No enhancing bone lesions identified. IMPRESSION: 1. Impacted stone within the distal cystic duct has mass effect upon the common bile duct. The stone measures 0.9 x 0.5 cm. Beyond this level the mid and distal common bile duct are nondilated without signs of distal choledocholithiasis. 2. Mild intrahepatic bile duct dilatation. Enhancement of the wall of the cystic duct and common bile duct noted. Findings are favored to reflect cholangitis. 3. Enhancing soft tissue within the gallbladder measures approximately 2.1 x  2.8 cm. This appears inseparable from the adjacent liver. Cannot exclude underlying gallbladder neoplasm. 4. Cirrhotic morphology of the liver with marked enlargement of the caudate lobe. No enhancing liver lesions identified. 5. Mild splenomegaly. 6. Lipoma within the right posterior paraspinal musculature measures 5.7 x 4.1 x 59.6 cm. Electronically Signed   By: Signa Kell M.D.   On: 11/23/2023 06:02   MR 3D Recon At Scanner Result Date: 11/23/2023 CLINICAL DATA:  Evaluate for choledocholithiasis. EXAM: MRI ABDOMEN WITHOUT AND WITH CONTRAST (INCLUDING MRCP) TECHNIQUE: Multiplanar multisequence MR imaging of the abdomen was performed both before and after the administration of intravenous contrast. Heavily T2-weighted images of the biliary and pancreatic ducts were obtained, and three-dimensional MRCP images were rendered by post processing. CONTRAST:  5mL GADAVIST GADOBUTROL 1 MMOL/ML IV SOLN COMPARISON:  None Available. FINDINGS: Lower chest: No acute findings. Hepatobiliary: Cirrhotic morphology of the liver is identified with marked enlargement of the caudate lobe. Contour the liver is nodular and there is widening of the falciform ligament. No enhancing liver lesions identified. The gallbladder appears decompressed around small stones. Enhancing soft tissue within the gallbladder measures approximately 2.1 x 2.8 cm, image 53/12 and image 79/1102. This appears inseparable from the adjacent liver. Mild intrahepatic bile duct dilatation. Enhancement of the wall of the cystic duct and common bile duct noted. Cystic duct is dilated measuring 1 cm in diameter. Impacted stone within the distal cystic duct has mass effect upon the common bile duct. The stone measures 0.9 x 0.5 cm, image 21/3. Beyond this level the mid and distal common bile duct are nondilated without signs  of choledocholithiasis. Pancreas: The pancreas appears edematous. No main duct dilatation or mass identified. No significant peripancreatic  fat stranding or free fluid. Spleen: The spleen measures 13.4 cm in cranial caudal dimension. No focal splenic abnormality. Adrenals/Urinary Tract: No suspicious adrenal mass. No hydronephrosis. Small hemorrhagic cyst noted within the upper pole of the left kidney measuring 5 mm, Bosniak class 2. Additional, less than 1 cm T2 hyperintense kidney lesions are noted compatible with Bosniak class 2 lesions. No follow-up imaging recommended. Stomach/Bowel: Stomach appears normal. No bowel wall thickening, inflammation or distension identified. Vascular/Lymphatic: Normal caliber abdominal aorta. No upper abdominal adenopathy. Portal vein is patent. Other:  No significant ascites or focal fluid collections. Musculoskeletal: Lipoma within the right posterior paraspinal musculature measures 5.7 x 4.1 by 59.6 cm. No enhancing bone lesions identified. IMPRESSION: 1. Impacted stone within the distal cystic duct has mass effect upon the common bile duct. The stone measures 0.9 x 0.5 cm. Beyond this level the mid and distal common bile duct are nondilated without signs of distal choledocholithiasis. 2. Mild intrahepatic bile duct dilatation. Enhancement of the wall of the cystic duct and common bile duct noted. Findings are favored to reflect cholangitis. 3. Enhancing soft tissue within the gallbladder measures approximately 2.1 x 2.8 cm. This appears inseparable from the adjacent liver. Cannot exclude underlying gallbladder neoplasm. 4. Cirrhotic morphology of the liver with marked enlargement of the caudate lobe. No enhancing liver lesions identified. 5. Mild splenomegaly. 6. Lipoma within the right posterior paraspinal musculature measures 5.7 x 4.1 x 59.6 cm. Electronically Signed   By: Signa Kell M.D.   On: 11/23/2023 06:02    Assessment/Plan 63 y/o M w/ a complicated PMH who presents with several days of post prandial pain and nausea as well as several months of general malaise and has imaging showing cirrhosis, a  cystic duct stone w/ possible CBD compression and cholangitis, splenomegaly, as well as a possible gallbladder mass.  - He has some signs of intrinsic compression of the CBD from the cystic duct stone, but I am not sure that the level of jaundice would be explained by this finding alone. Given the signs of cirrhosis and possible mass within the gallbladder, I would recommend additional imaging with a CT scan as well as GI consult to discuss additional workup and/or intervention  - Surgery will continue to follow to determine role for intervention during this admission   T[facility] 11/23/2023, 8:49 AM Please see Amion for pager number during day hours 7:00am-4:30pm or 7:00am -11:30am on weekends

## 2023-11-23 NOTE — Consult Note (Addendum)
 Attending physician's note   I have taken a history, reviewed the chart, and examined the patient. I performed a substantive portion of this encounter, including complete performance of at least one of the key components, in conjunction with the APP. I agree with the APP's note, impression, and recommendations with my edits.   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 bili 10.2 - INR 1.6 - MRCP: Enlarged caudate with nodular appearing liver, enhancing soft tissue within GB measuring 2.1 x 2.8 cm and appears inseparable from adjacent liver, mild intrahepatic duct dilation with dilated cystic duct and 0.9 x 0.5 cm impacted stone at the distal cystic duct having mass effect on the bile duct. - CT: Cirrhotic appearing liver with irregular contours, enlarged caudate and left lobe.  Small calcified gallstones at GB fundus and within GB neck, wall enhancement of the cystic and common hepatic ducts, mildly dilated intrahepatic ducts.  Normal-appearing pancreas, spleen, visualized GI tract.  Findings suspicious for cholecystitis and cholangitis.  Comparison labs from 03/2022: - AST/ALT 23/21, ALP 45, T. bili 1.2, albumin 3.8 - No history of thrombocytopenia  1) Cholelithiasis 2) Elevated liver enzymes 3) Jaundice 4) Abdominal pain Complex presentation with cholelithiasis and imaging suspicious for cholecystitis, but also with what appears to be a stone impacted in the cystic duct with CBD compression, along with enhancing soft tissue within the gallbladder of unclear etiology.  We discussed his case  at length with the patient and spouse at bedside today.  I additionally discussed his case with the consulting Surgical service, and briefly with our Advanced GI service.  Discussed role/utility of ERCP with stent decompression, to include risk/benefit profile of ERCP with patient today. - Continue trending liver enzymes - Per discussion with Surgical service, tentative plan for lap chole with IOC on this admission pending overall clinical status - Started on Zosyn - Liquids this evening and n.p.o. at midnight  5) Caudate lobe hypertrophy Does have some radiographic features of cirrhosis, to include caudate lobe hypertrophy, nodular liver contour, widening of the falciform ligament, and mild splenomegaly seen on MRI (but not appreciated on CT).  No suspicious intrahepatic lesions.  Most recent comparison labs from 2023 with normal enzymes, including normal bilirubin and albumin.  No history of thrombocytopenia prior to admission.  Somewhat difficult to fully interpret whether or not he truly does have cirrhosis as his labs are otherwise distorted by his acute presentation and INR might be somewhat affected by his Xarelto.  Nonetheless, there are at least some radiographic concerns. - Will do extended serologic workup to evaluate for concomitant liver disease - Continue trending liver enzymes - May eventually need EGD to evaluate for varices, portal hypertensive gastropathy, etc. - Repeat INR tomorrow  GI service will continue to follow.  [address] [place], [place] [phone] office         Consultation  Referring Provider: Reymundo Poll, MD Primary Care Physician:  Mercie Eon, MD Primary Gastroenterologist:  Dr.Stark - former  Reason for Consultation: Acute abdominal pain, fatigue and malaise, poor appetite and new onset jaundice  HPI: [name]  William Hageman. is a 63 y.o. male who was admitted yesterday after he had presented to the medicine service clinic with above  symptoms. 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  Labs on admission yesterday-WBC 5.6/hemoglobin 18.3/hematocrit 52.7 Lipase 36 Potassium 3.7/BUN 11/creatinine 1.28 T. bili 10.6/alk phos 141/AST 317/ALT 351  Repeat labs today WBC 4.8/hemoglobin 17.5/platelets 146 BUN 9/creatinine 1.21 T. bili 10.2/alk phos 130/AST 229/ALT 298 Pro time 18.9/INR 1.6  MRI/MRCP yesterday showed cirrhotic liver morphology no enhancing liver lesions, gallbladder decompressed around small stones there is enhancing soft tissue within the gallbladder measuring 2.1 x 2.8 cm and is appears inseparable from the adjacent liver, there is also mild intrahepatic bile duct dilation and enhancement of the wall of the cystic duct and common duct concerning for cholangitis, cystic duct is dilated with an impacted stone within the distal cystic duct having mass effect upon the common bile duct -with stone measuring 0.9 x 0.5 cm beyond this level of the mid and distal common bile duct nondilated with no evidence of choledocholithiasis, pancreas does appear edematous, mild splenomegaly.  CT of the liver/abdomen today she again shows changes of cirrhosis no evidence of hepatic neoplasm, cholelithiasis and suspected choledocholithiasis with mild gallbladder wall thickening and surrounding inflammation also wall enhancement of the cystic and common hepatic duct.  Patient has been been on his suppressive antibiotics since admission, no IV antibiotics.  He was evaluated by surgery earlier who suggested further workup of cirrhosis and the jaundice..  Patient and wife are at bedside, he says he feels about the same today is still hurting and  says his pain has been fairly constant since Friday.  He really has not had much of anything to eat since that time either.  Appetite, some nausea no vomiting He has not had any prior diagnosis of cirrhosis, he did have an ultrasound from 2022 that showed some hepatic steatosis but no evidence of cirrhosis. Does have remote history of regular EtOH use but social only for yearsno family history of liver disease that he is aware of. Hepatitis serologies are pending.  MELD 3.0: 24 at 11/23/2023 10:41 AM MELD-Na: 24 at 11/23/2023 10:41 AM Calculated from: Serum Creatinine: 1.21 mg/dL at 1/61/0960  4:54 AM Serum Sodium: 134 mmol/L at 11/23/2023  6:30 AM Total Bilirubin: 10.2 mg/dL at 0/98/1191  4:78 AM Serum Albumin: 3.1 g/dL at 2/95/6213  0:86 AM INR(ratio): 1.6 at 11/23/2023 10:41 AM Age at listing (hypothetical): 72 years Sex: Male at 11/23/2023 10:41 AM   Above MELD is difficult to interpret given his current jaundice which is driving the high MELD.   Past Medical History:  Diagnosis Date   Arthritis    Chronic systolic heart failure (HCC) 09/27/2019   Diabetes mellitus without complication (HCC)    TYPE 2   Diverticulosis    Dizziness 11/11/2022   He was at his baseline state of health until this Sunday, when he experienced dizziness at church. He felt "off" with blurred vision, and went to wait in the car, but was well enough to drive home. On Monday, he had a similar episode, and had to leave work early, which is very atypical. His dizziness is associated with fatigue and generally not feeling well. It comes and goes, but  he hasn't identi   Dyslipidemia    GERD (gastroesophageal reflux disease)    Herpes labialis    Hypertension    Longstanding persistent atrial fibrillation (HCC)    Metabolic syndrome    Obesity    Proteinuria    Psoriasis    S/P left TKA 06/28/2018   S/P TKR (total knee replacement), right 05/31/2018   Seborrheic dermatitis    Sleep apnea    very compliant  with CPAP, PT NEEDS TO BRING OWN MACHINE   TIA (transient ischemic attack) 2009    Past Surgical History:  Procedure Laterality Date   CARDIOVERSION N/A 12/02/2016   Procedure: CARDIOVERSION;  Surgeon: Chrystie Nose, MD;  Location[facility] ENDOSCOPY;  Service: Cardiovascular;  Laterality: N/A;   COLONOSCOPY     EXCISIONAL TOTAL KNEE ARTHROPLASTY WITH ANTIBIOTIC SPACERS Right 03/28/2019   Procedure: EXCISIONAL TOTAL KNEE ARTHROPLASTY WITH ANTIBIOTIC SPACERS;  Surgeon: Durene Romans, MD;  Location: [facility] ORS;  Service: Orthopedics;  Laterality: Right;  90 mins   I & D KNEE WITH POLY EXCHANGE Right 09/10/2018   Procedure: Right Knee Arthroplasty IRRIGATION AND DEBRIDEMENT KNEE WITH POLY EXCHANGE;  Surgeon: Durene Romans, MD;  Location: [facility] ORS;  Service: Orthopedics;  Laterality: Right;   I & D KNEE WITH POLY EXCHANGE Right 06/25/2020   Procedure: Open excisional and non excisional debridement right knee, possible aspiration versus open arthrotomy poly exchange;  Surgeon: Durene Romans, MD;  Location: [facility] ORS;  Service: Orthopedics;  Laterality: Right;   IRRIGATION AND DEBRIDEMENT KNEE Right 07/09/2020   Procedure: IRRIGATION AND DEBRIDEMENT KNEE;  Surgeon: Durene Romans, MD;  Location: [facility] ORS;  Service: Orthopedics;  Laterality: Right;   JOINT REPLACEMENT  11/03/10   LT HIP   PFO occluder cardiac valve  2006   Dr. Jacinto Halim, (hole in heart)   REIMPLANTATION OF TOTAL KNEE Right 06/15/2019   Procedure: Resection of antibiotic spacer and  irrigation and debridment and placement of new antibiotic spacer and components;  Surgeon: Durene Romans, MD;  Location: [facility] ORS;  Service: Orthopedics;  Laterality: Right;  90 mins   REIMPLANTATION OF TOTAL KNEE Right 08/22/2019   Procedure: REIMPLANTATION OF TOTAL KNEE;  Surgeon: Durene Romans, MD;  Location: [facility];  Service: Orthopedics;  Laterality: Right;  120 mins   SKIN BIOPSY Left 12/22/2019   epidermal inclusion cyst   TONSILLECTOMY AND ADENOIDECTOMY     TOTAL HIP  ARTHROPLASTY Left    TOTAL KNEE ARTHROPLASTY Right 05/31/2018   Procedure: RIGHT TOTAL KNEE ARTHROPLASTY;  Surgeon: Durene Romans, MD;  Location: [facility]S;  Service: Orthopedics;  Laterality: Right;  70 mins   TOTAL KNEE ARTHROPLASTY Left 06/28/2018   Procedure: LEFT TOTAL KNEE ARTHROPLASTY;  Surgeon: Durene Romans, MD;  Location: [facility] ORS;  Service: Orthopedics;  Laterality: Left;  70 mins   WISDOM TOOTH EXTRACTION      Prior to Admission medications   Medication Sig Start Date End Date Taking? Authorizing Provider  acetaminophen (TYLENOL) 500 MG tablet Take 500 mg by mouth every 6 (six) hours as needed.   Yes [provider]  b complex vitamins capsule Take 1 capsule by mouth daily.   Yes [provider]  carvedilol (COREG) 12.5 MG tablet Take 1 tablet (12.5 mg total) by mouth 2 (two) times daily. 02/02/23 02/02/24 Yes Mercie Eon, MD  cefadroxil (DURICEF) 500 MG capsule Take 1 capsule (500 mg total) by mouth 2 (two) times daily. 09/13/23  Yes Judyann Munson, MD  empagliflozin (JARDIANCE) 10 MG TABS tablet Take  1 tablet (10 mg total) by mouth daily. 10/11/23  Yes Mercie Eon, MD  methocarbamol (ROBAXIN) 500 MG tablet Take 1 tablet (500 mg total) by mouth every 6 (six) hours as needed for muscle spasms. 09/04/22  Yes Ronnald Nian, MD  Multiple Vitamin (MULTIVITAMIN WITH MINERALS) TABS tablet Take 1 tablet by mouth daily.   Yes [provider]  Oxymetazoline HCl (NASAL SPRAY) 0.05 % SOLN Place into the nose.   Yes [provider]  pantoprazole (PROTONIX) 40 MG tablet Take 1 tablet (40 mg total) by mouth daily. 09/28/23  Yes Mercie Eon, MD  rivaroxaban (XARELTO) 20 MG TABS tablet Take 1 tablet (20 mg total) by mouth daily with supper. 08/11/23  Yes [name], Deloris Ping, MD  rosuvastatin (CRESTOR) 10 MG tablet Take 1 tablet (10 mg total) by mouth daily. 06/21/23  Yes Mercie Eon, MD  sacubitril-valsartan (ENTRESTO) 24-26 MG Take 1 tablet by mouth 2 (two) times daily.  09/28/23  Yes [name], Deloris Ping, MD  TALTZ 80 MG/ML pen Inject 1 ml (80mg ) under the skin every 4 weeks. 10/07/23  Yes Quentin Angst, MD  tirzepatide [facility]) 10 MG/0.5ML Pen Inject 10 mg into the skin once a week. 10/11/23  Yes Mercie Eon, MD  Vitamin D, Cholecalciferol, 1000 units CAPS Take 1,000 Units by mouth daily.    Yes [provider]    Current Facility-Administered Medications  Medication Dose Route Frequency Provider Last Rate Last Admin   acetaminophen (TYLENOL) tablet 500 mg  500 mg Oral Q6H Tawkaliyar, Roya, DO   500 mg at 11/23/23 0804   cefadroxil (DURICEF) capsule 500 mg  500 mg Oral BID Tawkaliyar, Roya, DO   500 mg at 11/23/23 0804   gadobutrol (GADAVIST) 1 MMOL/ML injection 5 mL  5 mL Intravenous Once PRN Modena Slater, DO       HYDROmorphone HCl (DILAUDID) liquid 0.5 mg  0.5 mg Oral Q4H PRN Tawkaliyar, Roya, DO       methocarbamol (ROBAXIN) tablet 500 mg  500 mg Oral Q6H PRN Tawkaliyar, Roya, DO       ondansetron (ZOFRAN) tablet 4 mg  4 mg Oral Q6H PRN Tawkaliyar, Roya, DO       Or   ondansetron (ZOFRAN) injection 4 mg  4 mg Intravenous Q6H PRN Tawkaliyar, Roya, DO       pantoprazole (PROTONIX) EC tablet 40 mg  40 mg Oral Daily Tawkaliyar, Roya, DO   40 mg at 11/23/23 0804   polyethylene glycol (MIRALAX / GLYCOLAX) packet 17 g  17 g Oral Daily PRN Tawkaliyar, Roya, DO       sacubitril-valsartan (ENTRESTO) 24-26 mg per tablet  1 tablet Oral BID Tawkaliyar, Roya, DO   1 tablet at 11/23/23 0804    Allergies as of 11/22/2023 - Review Complete 11/22/2023  Allergen Reaction Noted   Morphine and codeine Other (See Comments) 05/31/2018    Family History  Problem Relation Age of Onset   Uterine cancer Mother 69       Cervical cath   Crohn's disease Mother    Esophageal cancer Father    Cancer Other    Obesity Other     Social History   Socioeconomic History   Marital status: Married    Spouse name: Not on file   Number of children: 2   Years of  education: Not on file   Highest education level: Bachelor's degree (e.g., BA, AB, BS)  Occupational History   Occupation: Information systems manager: [employer]  Tobacco Use   Smoking status: Never   Smokeless tobacco: Never  Vaping Use   Vaping status: Never Used  Substance and Sexual Activity   Alcohol use: Yes    Comment: rarely   Drug use: No   Sexual activity: Yes  Other Topics Concern   Not on file  Social History Narrative   Lives with spouse in [place]   Wife works for [employer] teaching program   Owns Social worker   Social Drivers of Health   Financial Resource Strain: Low Risk  (12/09/2021)   Overall Financial Resource Strain (CARDIA)    Difficulty of Paying Living Expenses: Not hard at all  Food Insecurity: No Food Insecurity (11/22/2023)   Hunger Vital Sign    Worried About Running Out of Food in the Last Year: Never true    Ran Out of Food in the Last Year: Never true  Transportation Needs: No Transportation Needs (11/22/2023)   PRAPARE - Administrator, Civil Service (Medical): No    Lack of Transportation (Non-Medical): No  Physical Activity: Insufficiently Active (12/09/2021)   Exercise Vital Sign    Days of Exercise per Week: 3 days    Minutes of Exercise per Session: 30 min  Stress: Stress Concern Present (12/09/2021)   [employer] of Occupational Health - Occupational Stress Questionnaire    Feeling of Stress : To some extent  Social Connections: Socially Integrated (12/09/2021)   Social Connection and Isolation Panel [NHANES]    Frequency of Communication with Friends and Family: More than three times a week    Frequency of Social Gatherings with Friends and Family: More than three times a week    Attends Religious Services: More than 4 times per year    Active Member of [employer] or Organizations: Yes    Attends Engineer, structural: More than 4 times per year    Marital Status: Married  Catering manager  Violence: Patient Unable To Answer (11/22/2023)   Humiliation, Afraid, Rape, and Kick questionnaire    Fear of Current or Ex-Partner: Patient unable to answer    Emotionally Abused: Patient unable to answer    Physically Abused: Patient unable to answer    Sexually Abused: Patient unable to answer    Review of Systems: Pertinent positive and negative review of systems were noted in the above HPI section.  All other review of systems was otherwise negative.   Physical Exam: Vital signs in last 24 hours: Temp:  [97.5 F (36.4 C)-98.5 F (36.9 C)] 97.5 F (36.4 C) (03/25 0600) Pulse Rate:  [66-91] 66 (03/25 0800) Resp:  [18] 18 (03/25 0800) BP: (105-118)/(69-83) 111/77 (03/25 0800) SpO2:  [96 %-99 %] 99 % (03/25 0800) Weight:  [112.3 kg-115.3 kg] 112.3 kg (03/24 1830) Last BM Date : 11/22/23 General:   Alert,  Well-developed, well-nourished, older white male, uncomfortable appearing pleasant and cooperative in NAD.  Wife at bedside Head:  Normocephalic and atraumatic. Eyes:  Sclera icteric   conjunctiva pink. Ears:  Normal auditory acuity. Nose:  No deformity, discharge,  or lesions. Mouth:  No deformity or lesions.   Neck:  Supple; no masses or thyromegaly. Lungs:  Clear throughout to auscultation.   No wheezes, crackles, or rhonchi.  Heart:  Regular rate and rhythm; no murmurs, clicks, rubs,  or gallops. Abdomen:  Soft, obese, he is tender in the right upper quadrant and epigastrium, no rebound, BS active,nonpalp mass or hsm.   Rectal: Not done Msk:  Symmetrical without gross deformities. . Pulses:  Normal pulses noted. Extremities:  Without clubbing or edema.. Neurologic:  Alert and  oriented x4;  grossly normal neurologically. Skin:  Intact without significant lesions or rashes.. Psych:  Alert and cooperative. Normal mood and affect.  Intake/Output from previous day: 03/24 0701 - 03/25 0700 In: -  Out: 500 [Urine:500] Intake/Output this shift: No intake/output data  recorded.  Lab Results: Recent Labs    11/22/23 1615 11/23/23 0630  WBC 5.6 4.8  HGB 18.3* 17.5*  HCT 52.7* 49.0  PLT 166 146*   BMET Recent Labs    11/22/23 1615 11/23/23 0630  NA 134* 134*  K 3.7 3.6  CL 103 104  CO2 21* 21*  GLUCOSE 107* 81  BUN 11 9  CREATININE 1.28* 1.21  CALCIUM 9.5 8.4*   LFT Recent Labs    11/23/23 0630  PROT 5.7*  ALBUMIN 3.1*  AST 229*  ALT 298*  ALKPHOS 130*  BILITOT 10.2*   PT/INR No results for input(s): "LABPROT", "INR" in the last 72 hours. Hepatitis Panel No results for input(s): "HEPBSAG", "HCVAB", "HEPAIGM", "HEPBIGM" in the last 72 hours.    IMPRESSION:  #19 63 year old white male with onset of upper abdominal pain, nausea, malaise, poor appetite on Friday, 11/19/2023 with persistent symptoms since. On admission yesterday noted to be jaundiced with T. bili of 10.6, elevated LFTs  Imaging with MRI/MRCP with multiple findings including an impacted stone in the distal cystic duct with mass effect on the common bile duct, no choledocholithiasis.  He does have mild intrahepatic ductal dilation and enhancement of the wall of the cystic and common bile duct concerning for cholangitis There is also soft tissue enhancing within the gallbladder measuring 2.1 x 2.8 cm this appears inseparable from the adjacent liver cannot rule out underlying gallbladder neoplasm Also noted cirrhotic morphology of the liver and mild splenomegaly  CT today confirms similar findings of cirrhotic appearing liver without any liver lesions.  MELD is significantly being skewed by T. bili above 10  #2 new diagnosis of cirrhosis-etiology not clear-suspect related to NASH/fatty liver disease  #3 diabetes mellitus #4.  Polycythemia #5.  Permanent A-fib on Xarelto-last dose over 48 hours ago #6.  Sleep apnea #7.  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 GI may be able to offer stent placement common bile duct to branch beyond the impacted stone though this technically may be difficult.  Will discuss with advanced biliary/Dr. Mansouraty. If surgery does not feel that he is a candidate for cholecystectomy here, then he may need to be transferred to tertiary care/hepatobiliary surgery for cholecystectomy.  Full liquids this evening GI will follow with you     [name] EsterwoodPA-C  11/23/2023, 9:10 AM

## 2023-11-24 DIAGNOSIS — K8020 Calculus of gallbladder without cholecystitis without obstruction: Secondary | ICD-10-CM | POA: Diagnosis not present

## 2023-11-24 DIAGNOSIS — K8309 Other cholangitis: Secondary | ICD-10-CM | POA: Diagnosis not present

## 2023-11-24 DIAGNOSIS — K7460 Unspecified cirrhosis of liver: Secondary | ICD-10-CM | POA: Diagnosis not present

## 2023-11-24 DIAGNOSIS — Z8619 Personal history of other infectious and parasitic diseases: Secondary | ICD-10-CM | POA: Diagnosis not present

## 2023-11-24 DIAGNOSIS — D45 Polycythemia vera: Secondary | ICD-10-CM | POA: Diagnosis not present

## 2023-11-24 DIAGNOSIS — I5030 Unspecified diastolic (congestive) heart failure: Secondary | ICD-10-CM | POA: Diagnosis not present

## 2023-11-24 DIAGNOSIS — R17 Unspecified jaundice: Secondary | ICD-10-CM | POA: Diagnosis not present

## 2023-11-24 LAB — COMPREHENSIVE METABOLIC PANEL
ALT: 200 U/L — ABNORMAL HIGH (ref 0–44)
AST: 115 U/L — ABNORMAL HIGH (ref 15–41)
Albumin: 2.9 g/dL — ABNORMAL LOW (ref 3.5–5.0)
Alkaline Phosphatase: 135 U/L — ABNORMAL HIGH (ref 38–126)
Anion gap: 12 (ref 5–15)
BUN: 13 mg/dL (ref 8–23)
CO2: 20 mmol/L — ABNORMAL LOW (ref 22–32)
Calcium: 8.5 mg/dL — ABNORMAL LOW (ref 8.9–10.3)
Chloride: 103 mmol/L (ref 98–111)
Creatinine, Ser: 1.14 mg/dL (ref 0.61–1.24)
GFR, Estimated: >60 mL/min (ref >60)
Glucose, Bld: 94 mg/dL (ref 70–99)
Potassium: 3.7 mmol/L (ref 3.5–5.1)
Sodium: 135 mmol/L (ref 135–145)
Total Bilirubin: 10.2 mg/dL — ABNORMAL HIGH (ref 0.0–1.2)
Total Protein: 5.6 g/dL — ABNORMAL LOW (ref 6.5–8.1)

## 2023-11-24 LAB — ANA W/REFLEX IF POSITIVE: Anti Nuclear Antibody (ANA): NEGATIVE

## 2023-11-24 LAB — CBC
HCT: 47.9 % (ref 39.0–52.0)
Hemoglobin: 17 g/dL (ref 13.0–17.0)
MCH: 28.2 pg (ref 26.0–34.0)
MCHC: 35.5 g/dL (ref 30.0–36.0)
MCV: 79.4 fL — ABNORMAL LOW (ref 80.0–100.0)
Platelets: 147 10*3/uL — ABNORMAL LOW (ref 150–400)
RBC: 6.03 MIL/uL — ABNORMAL HIGH (ref 4.22–5.81)
RDW: 18.1 % — ABNORMAL HIGH (ref 11.5–15.5)
WBC: 5 10*3/uL (ref 4.0–10.5)
nRBC: 0 % (ref 0.0–0.2)

## 2023-11-24 LAB — MITOCHONDRIAL ANTIBODIES: Mitochondrial M2 Ab, IgG: <20 U (ref 0.0–20.0)

## 2023-11-24 LAB — ANTI-SMOOTH MUSCLE ANTIBODY, IGG: F-Actin IgG: 3 U (ref 0–19)

## 2023-11-24 LAB — PROTIME-INR
INR: 1.3 — ABNORMAL HIGH (ref 0.8–1.2)
Prothrombin Time: 16.3 s — ABNORMAL HIGH (ref 11.4–15.2)

## 2023-11-24 LAB — ALPHA-1-ANTITRYPSIN: A-1 Antitrypsin, Ser: 114 mg/dL (ref 101–187)

## 2023-11-24 LAB — CERULOPLASMIN: Ceruloplasmin: 21.4 mg/dL (ref 16.0–31.0)

## 2023-11-24 NOTE — Progress Notes (Signed)
 Dilaudid 0.5 mg wasted into appropriate container witnessed by [employer].

## 2023-11-24 NOTE — Progress Notes (Signed)
 HD#2 SUBJECTIVE:  Patient Summary: [name]. is a 63 y.o. 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 Anxious and eager to move forward with surgery/procedures. Pain is 4/10 this morning. Passing gas, ate well last night. No new concerns.   OBJECTIVE:  Vital Signs: Vitals:   11/23/23 1621 11/23/23 2036 11/24/23 0506 11/24/23 0507  BP: 118/73 107/81 116/75 116/75  Pulse: 77 82 83 83  Resp: 18 16 16 16   Temp: 97.6 F (36.4 C) 97.7 F (36.5 C) 97.8 F (36.6 C) 97.8 F (36.6 C)  TempSrc: Oral Oral  Oral  SpO2: 98% 95% 96% 96%  Weight:      Height:       Supplemental O2: Room Air SpO2: 96 %  Filed Weights   11/22/23 1830  Weight: 112.3 kg     Intake/Output Summary (Last 24 hours) at 11/24/2023 0725 Last data filed at 11/23/2023 1841 Gross per 24 hour  Intake 162.87 ml  Output --  Net 162.87 ml   Net IO Since Admission: -337.13 mL [11/24/23 0725]  Physical Exam: Physical Exam General: Laying in bed, no acute distress HEENT: Scleral icterus Cardiovascular: Regular rate Pulmonary: Breathing comfortably, no wheezing or crackles Abdomen: Soft, nontender, nondistended MSK: No lower extremity edema bilaterally Skin: Mild jaundice noted  Patient Lines/Drains/Airways Status     Active Line/Drains/Airways     Name Placement date Placement time Site Days   Peripheral IV 20 G Anterior;Right Forearm --  --  Forearm  --   Closed System Drain Right Knee Accordion (Hemovac) 10 Fr. 07/09/20  1345  Knee  1233             ASSESSMENT/PLAN:  Assessment: Principal Problem:   Cystic  duct calculus Active Problems:   Obstructive sleep apnea syndrome   History of transient ischemic attack   Essential hypertension   Polycythemia vera (HCC)   Permanent atrial fibrillation (HCC)   Type 2 diabetes mellitus (HCC)   (HFpEF) heart failure with preserved ejection fraction (HCC)   Staphylococcal arthritis, right knee (HCC)   Liver cirrhosis (HCC)   Cholestasis   Cholangitis   Elevated bilirubin  [name]. is a 63 y.o. 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    Cholestasis with liver injury, possibly multifactorial MRCP with impacted cystic duct and compression of proximal CBD Enhancing gallbladder mass with invasion into adjacent liver Liver cirrhosis (newly diagnosed) Mild Splenomegaly Patient has markedly elevated T bili > 10, splenomegaly, liver cirrhosis and mild intrahepatic bile duct dilatation noted in the MRCP. GI ordered workup for newly diagnosed liver cirrhosis, anti-smooth muscle antibody negative, mitochondrial antibody negative.GI and general surgery on board, will possibly have ERCP tomorrow or Friday, with goals of biopsy of the caudate liver and send gallbladder for pathology to analyze for any underlying malignancy.  -Follow-up on liver cirrhosis workup by GI: Alpha-1 antitrypsin and Ceruplasmin  -Follow up AM CMP -F/u Hepatitis serologies  -  NPO at midnight    Acute cholangitis Jaundice  MRCP findings impacted stone within the distal cystic duct has mass effect upon the common bile duct.  Beyond this level the mid and distal common bile ducts are nondilated without signs of distal choledocholithiasis.  Enhancement of the wall of the cystic duct and common bile duct  noted favoring cholangitis. There is also a concern for neoplasm of the gallbladder. General surgery consulted, appreciate their assistance with this case.  General surgery and GI on board, current plans are ERCP possibly tomorrow or Friday. - NPO at midnight - PRN Robaxin 500 mg q6h  - Tylenol 500 mg every 6 hours - PRN Dilaudid 0.5 mg every 4 hours for severe pain  - PRN Zofran q6h for nausea  - IV Zosyn    Permanent Afib - Rate controlled - Hold home medication Xarelto for possible procedure.    T2DM Home medication Mounjaro 10 mg weekly and Jardiance 10 mg daily.  - Hold Jardiance to optimize blood pressures    GERD - Continue home medication Protonix EC 40 mg daily    Polycythemia Vera JAK 2 negative  Patient is supposed to go for routine phlebotomy to keep HCT down, has not been going for a while. Hgb 18.3 and HCT 52.7, will need to address outpatient.    HFpEF [EF 55-60%] GDMT consists of Entresto 24-26 mg BID, Coreg 12.5 BID, and Jardiance 10 mg daily - Will continue Entresto 24-26 mg BID - Hold Coreg and Jardiance to optimize blood pressures    Hx of Prosthetic Joint infection with MSSA on chronic antibiotic suppression therapy  Follows ID clinic outpatient. Reports that he is not taking Augmentin. - Hold Cefadroxil 500 mg BID, on Zosyn     Lipoma  - Incidental finding noted on MRCP at right posterior paraspinal musculature measures.  - Will need OP follow up   Best Practice: Diet: NPO at midnight  IVF: None  VTE: SCDs Start: 11/22/23 1832 Code: Full AB: Zosyn  Wounds: none  Therapy Recs: pending  DISPO: Anticipated discharge  3-5 days  to  pending  pending  medical management .  Signature: Jeral Pinch, D.O.  Internal Medicine Resident, PGY-1 [facility]  Pager: [phone] 7:25 AM, 11/24/2023   Please contact the on call pager after 5 pm and on weekends at [phone].

## 2023-11-24 NOTE — Plan of Care (Signed)
   Problem: Nutrition: Goal: Adequate nutrition will be maintained Outcome: Progressing   Problem: Elimination: Goal: Will not experience complications related to bowel motility Outcome: Progressing   Problem: Pain Managment: Goal: General experience of comfort will improve and/or be controlled Outcome: Progressing   Problem: Safety: Goal: Ability to remain free from injury will improve Outcome: Progressing

## 2023-11-24 NOTE — H&P (View-Only) (Signed)
 Patient ID: William Sill., male   DOB: 02/24/1961, 63 y.o.   MRN: [id number]     Attending physician's note   I have taken a history, reviewed the chart, and examined the patient. I performed a substantive portion of this encounter, including complete performance of at least one of the key components, in conjunction with the APP. I agree with the APP's note, impression, and recommendations with my edits.   Case discussed with the Surgical service and the advanced GI service.  Will plan for ERCP tomorrow with Dr. Leone Payor for stent placement and biliary decompression.  Discussed with patient this morning and again this afternoon.  Discussed risk/benefit profile of ERCP at length and he would like to proceed.  Did explain that the goal of ERCP would be for stent placement with biliary decompression, but not attempting removal of a cystic duct stone, and he is in full understanding.    [name], DO, FACG [phone] office          Progress Note   Subjective  Day # 2 CC; acute abdominal pain, fatigue malaise, poor appetite and new onset jaundice.  IV Zosyn Labs today-pro time 16.3/INR 1.3 WBC 5.0/hemoglobin 17/hematocrit 47.9/platelets 147 BUN 13/creatinine 1.14 T. bili 10.2/alk phos 135/AST 115/ALT 200  Patient says he feels about the same as yesterday, still having fairly constant pain no worse, afebrile overnight, no vomiting Anxious to move forward    Objective   Vital signs in last 24 hours: Temp:  [97.6 F (36.4 C)-98.3 F (36.8 C)] 98.3 F (36.8 C) (03/26 0737) Pulse Rate:  [75-83] 75 (03/26 0737) Resp:  [16-20] 20 (03/26 0737) BP: (107-119)/(73-84) 119/84 (03/26 0737) SpO2:  [95 %-98 %] 97 % (03/26 0737) Last BM Date : 11/23/23 General:    Older white male in NAD, jaundiced Heart:  Regular rate and rhythm; no murmurs Lungs: Respirations even and unlabored, lungs CTA bilaterally Abdomen:  Soft, obese, tender in the right upper quadrant/epigastrium  with no rebound. Normal bowel sounds. Extremities:  Without edema. Neurologic:  Alert and oriented,  grossly normal neurologically. Psych:  Cooperative. Normal mood and affect.  Intake/Output from previous day: 03/25 0701 - 03/26 0700 In: 162.9 [I.V.:162.9; IV Piggyback:0] Out: -  Intake/Output this shift: Total I/O In: 240 [P.O.:240] Out: -   Lab Results: Recent Labs    11/22/23 1615 11/23/23 0630 11/24/23 0608  WBC 5.6 4.8 5.0  HGB 18.3* 17.5* 17.0  HCT 52.7* 49.0 47.9  PLT 166 146* 147*   BMET Recent Labs    11/22/23 1615 11/23/23 0630 11/24/23 0608  NA 134* 134* 135  K 3.7 3.6 3.7  CL 103 104 103  CO2 21* 21* 20*  GLUCOSE 107* 81 94  BUN 11 9 13   CREATININE 1.28* 1.21 1.14  CALCIUM 9.5 8.4* 8.5*   LFT Recent Labs    11/24/23 0608  PROT 5.6*  ALBUMIN 2.9*  AST 115*  ALT 200*  ALKPHOS 135*  BILITOT 10.2*   PT/INR Recent Labs    11/23/23 1041 11/24/23 0608  LABPROT 18.9* 16.3*  INR 1.6* 1.3*    Studies/Results: CT LIVER ABDOMEN W WO CONTRAST Result Date: 11/23/2023 CLINICAL DATA:  Cirrhosis with jaundice. Eval for hepatic or extrahepatic lesions EXAM: CT ABDOMEN WITHOUT AND WITH CONTRAST TECHNIQUE: Multidetector CT imaging of the abdomen was performed following the standard protocol before and following the bolus administration of intravenous contrast. RADIATION DOSE REDUCTION: This exam was performed according to the departmental dose-optimization program which  includes automated exposure control, adjustment of the mA and/or kV according to patient size and/or use of iterative reconstruction technique. CONTRAST:  OMNIPAQUE IOHEXOL 350 MG/ML SOLN COMPARISON:  Right upper quadrant abdominal ultrasound 04/09/2021. Abdominal MRI 11/22/2023. FINDINGS: Lower chest: Clear lung bases. No significant pleural or pericardial effusion. The heart is mildly enlarged. There is aortic and coronary artery atherosclerosis with evidence of previous ASD closure.  Hepatobiliary: Morphologic changes of cirrhosis again noted with diffuse contour irregularity of the liver and enlargement of the caudate and left lobes. No arterial phase enhancing liver lesions are demonstrated. As on recent MRI, the gallbladder is contracted with mild wall thickening and probable surrounding inflammation. There are small calcified gallstones at the gallbladder fundus and within the gallbladder neck. Suspected partially calcified 5 mm calculus within the common hepatic duct (image 32/3). As seen on MRI, there is wall enhancement of the cystic and common hepatic ducts which are mildly dilated. There is also mild intrahepatic biliary dilatation. The common bile duct appears decompressed. Pancreas: No evidence of pancreatic mass, pancreatic ductal dilatation or surrounding inflammation. Spleen: Normal in size without focal abnormality. Adrenals/Urinary Tract: Both adrenal glands appear normal. No evidence of urinary tract calculus or hydronephrosis. There are small renal cysts bilaterally for which no specific follow-up imaging is recommended. On the delayed post-contrast images, there is a fluid-fluid level within the left renal pelvis without definite associated mass lesion or abnormal enhancement, likely physiologic. There is no perinephric soft tissue stranding. Bladder not imaged. Stomach/Bowel: No enteric contrast administered. The stomach appears unremarkable for its degree of distension. No evidence of bowel wall thickening, distention or surrounding inflammatory change.The appendix appears normal. Mild distal colonic diverticulosis. Vascular/Lymphatic: There are no enlarged abdominal lymph nodes. Aortic and branch vessel atherosclerosis without evidence of aneurysm or large vessel occlusion. Other: No evidence of abdominal wall hernia, ascites or pneumoperitoneum. Musculoskeletal: No acute or significant osseous findings. Multilevel spondylosis. Again noted is a lipomatous mass within the  right erector spinae muscles measuring 5.0 x 4.6 cm transverse and extending approximately 10.6 cm in length. This demonstrates no aggressive characteristics. IMPRESSION: 1. Morphologic changes of cirrhosis without evidence of hepatic neoplasm. 2. Cholelithiasis and suspected choledocholithiasis with mild gallbladder wall thickening and surrounding inflammation. There is also wall enhancement of the cystic and common hepatic ducts which are mildly dilated. Findings are suspicious for cholecystitis and cholangitis. Follow-up imaging or ERCP recommended to exclude biliary malignancy. 3. 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 4. Aortic atherosclerosis. Electronically Signed   By: Carey Bullocks M.D.   On: 11/23/2023 15:58   MR ABDOMEN WITH MRCP W CONTRAST Result Date: 11/23/2023 CLINICAL DATA:  Evaluate for choledocholithiasis. EXAM: MRI ABDOMEN WITHOUT AND WITH CONTRAST (INCLUDING MRCP) TECHNIQUE: Multiplanar multisequence MR imaging of the abdomen was performed both before and after the administration of intravenous contrast. Heavily T2-weighted images of the biliary and pancreatic ducts were obtained, and three-dimensional MRCP images were rendered by post processing. CONTRAST:  5mL GADAVIST GADOBUTROL 1 MMOL/ML IV SOLN COMPARISON:  None Available. FINDINGS: Lower chest: No acute findings. Hepatobiliary: Cirrhotic morphology of the liver is identified with marked enlargement of the caudate lobe. Contour the liver is nodular and there is widening of the falciform ligament. No enhancing liver lesions identified. The gallbladder appears decompressed around small stones. Enhancing soft tissue within the gallbladder measures approximately 2.1 x 2.8 cm, image 53/12 and image 79/1102. This appears inseparable from the adjacent liver. Mild intrahepatic bile duct dilatation.  Enhancement of the wall of the cystic duct and common bile duct noted. Cystic duct is dilated measuring 1  cm in diameter. Impacted stone within the distal cystic duct has mass effect upon the common bile duct. The stone measures 0.9 x 0.5 cm, image 21/3. Beyond this level the mid and distal common bile duct are nondilated without signs of choledocholithiasis. Pancreas: The pancreas appears edematous. No main duct dilatation or mass identified. No significant peripancreatic fat stranding or free fluid. Spleen: The spleen measures 13.4 cm in cranial caudal dimension. No focal splenic abnormality. Adrenals/Urinary Tract: No suspicious adrenal mass. No hydronephrosis. Small hemorrhagic cyst noted within the upper pole of the left kidney measuring 5 mm, Bosniak class 2. Additional, less than 1 cm T2 hyperintense kidney lesions are noted compatible with Bosniak class 2 lesions. No follow-up imaging recommended. Stomach/Bowel: Stomach appears normal. No bowel wall thickening, inflammation or distension identified. Vascular/Lymphatic: Normal caliber abdominal aorta. No upper abdominal adenopathy. Portal vein is patent. Other:  No significant ascites or focal fluid collections. Musculoskeletal: Lipoma within the right posterior paraspinal musculature measures 5.7 x 4.1 by 59.6 cm. No enhancing bone lesions identified. IMPRESSION: 1. Impacted stone within the distal cystic duct has mass effect upon the common bile duct. The stone measures 0.9 x 0.5 cm. Beyond this level the mid and distal common bile duct are nondilated without signs of distal choledocholithiasis. 2. Mild intrahepatic bile duct dilatation. Enhancement of the wall of the cystic duct and common bile duct noted. Findings are favored to reflect cholangitis. 3. Enhancing soft tissue within the gallbladder measures approximately 2.1 x 2.8 cm. This appears inseparable from the adjacent liver. Cannot exclude underlying gallbladder neoplasm. 4. Cirrhotic morphology of the liver with marked enlargement of the caudate lobe. No enhancing liver lesions identified. 5. Mild  splenomegaly. 6. Lipoma within the right posterior paraspinal musculature measures 5.7 x 4.1 x 59.6 cm. Electronically Signed   By: Signa Kell M.D.   On: 11/23/2023 06:02   MR 3D Recon At Scanner Result Date: 11/23/2023 CLINICAL DATA:  Evaluate for choledocholithiasis. EXAM: MRI ABDOMEN WITHOUT AND WITH CONTRAST (INCLUDING MRCP) TECHNIQUE: Multiplanar multisequence MR imaging of the abdomen was performed both before and after the administration of intravenous contrast. Heavily T2-weighted images of the biliary and pancreatic ducts were obtained, and three-dimensional MRCP images were rendered by post processing. CONTRAST:  5mL GADAVIST GADOBUTROL 1 MMOL/ML IV SOLN COMPARISON:  None Available. FINDINGS: Lower chest: No acute findings. Hepatobiliary: Cirrhotic morphology of the liver is identified with marked enlargement of the caudate lobe. Contour the liver is nodular and there is widening of the falciform ligament. No enhancing liver lesions identified. The gallbladder appears decompressed around small stones. Enhancing soft tissue within the gallbladder measures approximately 2.1 x 2.8 cm, image 53/12 and image 79/1102. This appears inseparable from the adjacent liver. Mild intrahepatic bile duct dilatation. Enhancement of the wall of the cystic duct and common bile duct noted. Cystic duct is dilated measuring 1 cm in diameter. Impacted stone within the distal cystic duct has mass effect upon the common bile duct. The stone measures 0.9 x 0.5 cm, image 21/3. Beyond this level the mid and distal common bile duct are nondilated without signs of choledocholithiasis. Pancreas: The pancreas appears edematous. No main duct dilatation or mass identified. No significant peripancreatic fat stranding or free fluid. Spleen: The spleen measures 13.4 cm in cranial caudal dimension. No focal splenic abnormality. Adrenals/Urinary Tract: No suspicious adrenal mass. No hydronephrosis. Small hemorrhagic cyst noted  within the  upper pole of the left kidney measuring 5 mm, Bosniak class 2. Additional, less than 1 cm T2 hyperintense kidney lesions are noted compatible with Bosniak class 2 lesions. No follow-up imaging recommended. Stomach/Bowel: Stomach appears normal. No bowel wall thickening, inflammation or distension identified. Vascular/Lymphatic: Normal caliber abdominal aorta. No upper abdominal adenopathy. Portal vein is patent. Other:  No significant ascites or focal fluid collections. Musculoskeletal: Lipoma within the right posterior paraspinal musculature measures 5.7 x 4.1 by 59.6 cm. No enhancing bone lesions identified. IMPRESSION: 1. Impacted stone within the distal cystic duct has mass effect upon the common bile duct. The stone measures 0.9 x 0.5 cm. Beyond this level the mid and distal common bile duct are nondilated without signs of distal choledocholithiasis. 2. Mild intrahepatic bile duct dilatation. Enhancement of the wall of the cystic duct and common bile duct noted. Findings are favored to reflect cholangitis. 3. Enhancing soft tissue within the gallbladder measures approximately 2.1 x 2.8 cm. This appears inseparable from the adjacent liver. Cannot exclude underlying gallbladder neoplasm. 4. Cirrhotic morphology of the liver with marked enlargement of the caudate lobe. No enhancing liver lesions identified. 5. Mild splenomegaly. 6. Lipoma within the right posterior paraspinal musculature measures 5.7 x 4.1 x 59.6 cm. Electronically Signed   By: Signa Kell M.D.   On: 11/23/2023 06:02       Assessment / Plan:    #35 63 year old white male with onset of upper abdominal pain, nausea, poor appetite and malaise since Friday, 11/19/2023 with fairly constant abdominal pain since Admitted yesterday after being noted to be jaundiced and found to have T. bili of 10.6  Workup including MRI/MRCP and CT has shown an impacted stone in the distal cystic duct with mass effect on the common bile duct, no  choledocholithiasis, there is mild intrahepatic ductal dilation and enhancement of the wall of the cystic and common bile duct concerning for cholangitis. There is also soft tissue enhancing within the gallbladder measuring 2.1 x 2.8 cm which appears inseparable from the adjacent liver, cannot rule out underlying gallbladder neoplasm.  Cirrhotic morphology of the liver and mild splenomegaly.  IV Zosyn started yesterday. Patient has been afebrile overnight Labs stable INR improved suspect Xarelto effect now washed out  Concern for underlying cirrhosis which if he has has to this point been compensated and very difficult to determine true MELD etc. in setting of jaundice.  We have discussed patient with surgery/Dr. Audrie Lia, and advanced biliary/Dr. Mansouraty-and after further discussions today surgery would like common bile duct stent to be placed for decompression of the bile duct, before considering any surgery.  Surgery may be delayed until next week.  Plan; continue clear liquids today, n.p.o. after midnight Patient has been scheduled for ERCP with stent placement with Dr. Leone Payor for tomorrow 11/25/2023.  Procedure has been discussed in detail with the patient including indications risks and benefits, reviewed biliary images, and he is agreeable to proceed.  He will need to be observed overnight after procedure tomorrow, trend LFTs etc. Patient was updated this afternoon. Continue Zosyn Hold Xarelto, no Lovenox tomorrow We will continue to follow with you.     Principal Problem:   Cystic duct calculus Active Problems:   Obstructive sleep apnea syndrome   History of transient ischemic attack   Essential hypertension   Polycythemia vera (HCC)   Permanent atrial fibrillation (HCC)   Type 2 diabetes mellitus (HCC)   (HFpEF) heart failure with preserved ejection fraction (HCC)   Staphylococcal  arthritis, right knee (HCC)   Liver cirrhosis (HCC)   Cholestasis   Cholangitis    Elevated bilirubin     LOS: 2 days   [name] EsterwoodPA-C  11/24/2023, 3:11 PM

## 2023-11-24 NOTE — Progress Notes (Signed)
 Progress Note     Subjective: Ongoing abdominal pain in epigastrium and RUQ. Ate some soup and clear liquids last night with stable pain. No n/v. Having loose bowel movements. NPO since midnight   Objective: Vital signs in last 24 hours: Temp:  [97.6 F (36.4 C)-98.3 F (36.8 C)] 98.3 F (36.8 C) (03/26 0737) Pulse Rate:  [75-83] 75 (03/26 0737) Resp:  [16-20] 20 (03/26 0737) BP: (107-119)/(73-84) 119/84 (03/26 0737) SpO2:  [95 %-98 %] 97 % (03/26 0737) Last BM Date : 11/23/23  Intake/Output from previous day: 03/25 0701 - 03/26 0700 In: 162.9 [I.V.:162.9; IV Piggyback:0] Out: -  Intake/Output this shift: No intake/output data recorded.  PE: General: pleasant, WD, male who is laying in bed in NAD HEENT: scleral icterus Heart: regular rate  Lungs: Respiratory effort nonlabored on room air Abd: soft, ND, mild TTP in epigastrium and RUQ without rebound or guarding  MSK: all 4 extremities are symmetrical with no cyanosis, clubbing, or edema. Skin: warm and dry Psych: A&Ox3 with an appropriate affect.    Lab Results:  Recent Labs    11/23/23 0630 11/24/23 0608  WBC 4.8 5.0  HGB 17.5* 17.0  HCT 49.0 47.9  PLT 146* 147*   BMET Recent Labs    11/23/23 0630 11/24/23 0608  NA 134* 135  K 3.6 3.7  CL 104 103  CO2 21* 20*  GLUCOSE 81 94  BUN 9 13  CREATININE 1.21 1.14  CALCIUM 8.4* 8.5*   PT/INR Recent Labs    11/23/23 1041 11/24/23 0608  LABPROT 18.9* 16.3*  INR 1.6* 1.3*   CMP     Component Value Date/Time   NA 135 11/24/2023 0608   NA 137 04/28/2023 1114   NA 138 05/22/2015 1303   K 3.7 11/24/2023 0608   K 4.1 05/22/2015 1303   CL 103 11/24/2023 0608   CO2 20 (L) 11/24/2023 0608   CO2 23 05/22/2015 1303   GLUCOSE 94 11/24/2023 0608   GLUCOSE 162 (H) 05/22/2015 1303   BUN 13 11/24/2023 0608   BUN 15 04/28/2023 1114   BUN 17.4 05/22/2015 1303   CREATININE 1.14 11/24/2023 0608   CREATININE 1.20 03/01/2023 0923   CREATININE 1.1 05/22/2015  1303   CALCIUM 8.5 (L) 11/24/2023 0608   CALCIUM 9.4 05/22/2015 1303   PROT 5.6 (L) 11/24/2023 0608   PROT 6.9 05/27/2020 0916   PROT 6.9 05/22/2015 1303   ALBUMIN 2.9 (L) 11/24/2023 0608   ALBUMIN 4.6 05/27/2020 0916   ALBUMIN 3.9 05/22/2015 1303   AST 115 (H) 11/24/2023 0608   AST 23 04/14/2022 1320   AST 24 05/22/2015 1303   ALT 200 (H) 11/24/2023 0608   ALT 21 04/14/2022 1320   ALT 30 05/22/2015 1303   ALKPHOS 135 (H) 11/24/2023 0608   ALKPHOS 73 05/22/2015 1303   BILITOT 10.2 (H) 11/24/2023 0608   BILITOT 1.2 04/14/2022 1320   BILITOT 0.77 05/22/2015 1303   GFRNONAA >60 11/24/2023 0608   GFRNONAA >60 04/14/2022 1320   GFRNONAA 66 11/24/2016 0946   GFRAA 74 05/27/2020 0916   GFRAA 76 11/24/2016 0946   Lipase     Component Value Date/Time   LIPASE 36 11/22/2023 1615       Studies/Results: CT LIVER ABDOMEN W WO CONTRAST Result Date: 11/23/2023 CLINICAL DATA:  Cirrhosis with jaundice. Eval for hepatic or extrahepatic lesions EXAM: CT ABDOMEN WITHOUT AND WITH CONTRAST TECHNIQUE: Multidetector CT imaging of the abdomen was performed following the standard protocol before and  following the bolus administration of intravenous contrast. RADIATION DOSE REDUCTION: This exam was performed according to the departmental dose-optimization program which includes automated exposure control, adjustment of the mA and/or kV according to patient size and/or use of iterative reconstruction technique. CONTRAST:  OMNIPAQUE IOHEXOL 350 MG/ML SOLN COMPARISON:  Right upper quadrant abdominal ultrasound 04/09/2021. Abdominal MRI 11/22/2023. FINDINGS: Lower chest: Clear lung bases. No significant pleural or pericardial effusion. The heart is mildly enlarged. There is aortic and coronary artery atherosclerosis with evidence of previous ASD closure. Hepatobiliary: Morphologic changes of cirrhosis again noted with diffuse contour irregularity of the liver and enlargement of the caudate and left  lobes. No arterial phase enhancing liver lesions are demonstrated. As on recent MRI, the gallbladder is contracted with mild wall thickening and probable surrounding inflammation. There are small calcified gallstones at the gallbladder fundus and within the gallbladder neck. Suspected partially calcified 5 mm calculus within the common hepatic duct (image 32/3). As seen on MRI, there is wall enhancement of the cystic and common hepatic ducts which are mildly dilated. There is also mild intrahepatic biliary dilatation. The common bile duct appears decompressed. Pancreas: No evidence of pancreatic mass, pancreatic ductal dilatation or surrounding inflammation. Spleen: Normal in size without focal abnormality. Adrenals/Urinary Tract: Both adrenal glands appear normal. No evidence of urinary tract calculus or hydronephrosis. There are small renal cysts bilaterally for which no specific follow-up imaging is recommended. On the delayed post-contrast images, there is a fluid-fluid level within the left renal pelvis without definite associated mass lesion or abnormal enhancement, likely physiologic. There is no perinephric soft tissue stranding. Bladder not imaged. Stomach/Bowel: No enteric contrast administered. The stomach appears unremarkable for its degree of distension. No evidence of bowel wall thickening, distention or surrounding inflammatory change.The appendix appears normal. Mild distal colonic diverticulosis. Vascular/Lymphatic: There are no enlarged abdominal lymph nodes. Aortic and branch vessel atherosclerosis without evidence of aneurysm or large vessel occlusion. Other: No evidence of abdominal wall hernia, ascites or pneumoperitoneum. Musculoskeletal: No acute or significant osseous findings. Multilevel spondylosis. Again noted is a lipomatous mass within the right erector spinae muscles measuring 5.0 x 4.6 cm transverse and extending approximately 10.6 cm in length. This demonstrates no aggressive  characteristics. IMPRESSION: 1. Morphologic changes of cirrhosis without evidence of hepatic neoplasm. 2. Cholelithiasis and suspected choledocholithiasis with mild gallbladder wall thickening and surrounding inflammation. There is also wall enhancement of the cystic and common hepatic ducts which are mildly dilated. Findings are suspicious for cholecystitis and cholangitis. Follow-up imaging or ERCP recommended to exclude biliary malignancy. 3. 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 4. Aortic atherosclerosis. Electronically Signed   By: Carey Bullocks M.D.   On: 11/23/2023 15:58   MR ABDOMEN WITH MRCP W CONTRAST Result Date: 11/23/2023 CLINICAL DATA:  Evaluate for choledocholithiasis. EXAM: MRI ABDOMEN WITHOUT AND WITH CONTRAST (INCLUDING MRCP) TECHNIQUE: Multiplanar multisequence MR imaging of the abdomen was performed both before and after the administration of intravenous contrast. Heavily T2-weighted images of the biliary and pancreatic ducts were obtained, and three-dimensional MRCP images were rendered by post processing. CONTRAST:  5mL GADAVIST GADOBUTROL 1 MMOL/ML IV SOLN COMPARISON:  None Available. FINDINGS: Lower chest: No acute findings. Hepatobiliary: Cirrhotic morphology of the liver is identified with marked enlargement of the caudate lobe. Contour the liver is nodular and there is widening of the falciform ligament. No enhancing liver lesions identified. The gallbladder appears decompressed around small stones. Enhancing soft tissue within the gallbladder measures approximately  2.1 x 2.8 cm, image 53/12 and image 79/1102. This appears inseparable from the adjacent liver. Mild intrahepatic bile duct dilatation. Enhancement of the wall of the cystic duct and common bile duct noted. Cystic duct is dilated measuring 1 cm in diameter. Impacted stone within the distal cystic duct has mass effect upon the common bile duct. The stone measures 0.9 x 0.5 cm, image  21/3. Beyond this level the mid and distal common bile duct are nondilated without signs of choledocholithiasis. Pancreas: The pancreas appears edematous. No main duct dilatation or mass identified. No significant peripancreatic fat stranding or free fluid. Spleen: The spleen measures 13.4 cm in cranial caudal dimension. No focal splenic abnormality. Adrenals/Urinary Tract: No suspicious adrenal mass. No hydronephrosis. Small hemorrhagic cyst noted within the upper pole of the left kidney measuring 5 mm, Bosniak class 2. Additional, less than 1 cm T2 hyperintense kidney lesions are noted compatible with Bosniak class 2 lesions. No follow-up imaging recommended. Stomach/Bowel: Stomach appears normal. No bowel wall thickening, inflammation or distension identified. Vascular/Lymphatic: Normal caliber abdominal aorta. No upper abdominal adenopathy. Portal vein is patent. Other:  No significant ascites or focal fluid collections. Musculoskeletal: Lipoma within the right posterior paraspinal musculature measures 5.7 x 4.1 by 59.6 cm. No enhancing bone lesions identified. IMPRESSION: 1. Impacted stone within the distal cystic duct has mass effect upon the common bile duct. The stone measures 0.9 x 0.5 cm. Beyond this level the mid and distal common bile duct are nondilated without signs of distal choledocholithiasis. 2. Mild intrahepatic bile duct dilatation. Enhancement of the wall of the cystic duct and common bile duct noted. Findings are favored to reflect cholangitis. 3. Enhancing soft tissue within the gallbladder measures approximately 2.1 x 2.8 cm. This appears inseparable from the adjacent liver. Cannot exclude underlying gallbladder neoplasm. 4. Cirrhotic morphology of the liver with marked enlargement of the caudate lobe. No enhancing liver lesions identified. 5. Mild splenomegaly. 6. Lipoma within the right posterior paraspinal musculature measures 5.7 x 4.1 x 59.6 cm. Electronically Signed   By: Signa Kell  M.D.   On: 11/23/2023 06:02   MR 3D Recon At Scanner Result Date: 11/23/2023 CLINICAL DATA:  Evaluate for choledocholithiasis. EXAM: MRI ABDOMEN WITHOUT AND WITH CONTRAST (INCLUDING MRCP) TECHNIQUE: Multiplanar multisequence MR imaging of the abdomen was performed both before and after the administration of intravenous contrast. Heavily T2-weighted images of the biliary and pancreatic ducts were obtained, and three-dimensional MRCP images were rendered by post processing. CONTRAST:  5mL GADAVIST GADOBUTROL 1 MMOL/ML IV SOLN COMPARISON:  None Available. FINDINGS: Lower chest: No acute findings. Hepatobiliary: Cirrhotic morphology of the liver is identified with marked enlargement of the caudate lobe. Contour the liver is nodular and there is widening of the falciform ligament. No enhancing liver lesions identified. The gallbladder appears decompressed around small stones. Enhancing soft tissue within the gallbladder measures approximately 2.1 x 2.8 cm, image 53/12 and image 79/1102. This appears inseparable from the adjacent liver. Mild intrahepatic bile duct dilatation. Enhancement of the wall of the cystic duct and common bile duct noted. Cystic duct is dilated measuring 1 cm in diameter. Impacted stone within the distal cystic duct has mass effect upon the common bile duct. The stone measures 0.9 x 0.5 cm, image 21/3. Beyond this level the mid and distal common bile duct are nondilated without signs of choledocholithiasis. Pancreas: The pancreas appears edematous. No main duct dilatation or mass identified. No significant peripancreatic fat stranding or free fluid. Spleen: The spleen measures 13.4  cm in cranial caudal dimension. No focal splenic abnormality. Adrenals/Urinary Tract: No suspicious adrenal mass. No hydronephrosis. Small hemorrhagic cyst noted within the upper pole of the left kidney measuring 5 mm, Bosniak class 2. Additional, less than 1 cm T2 hyperintense kidney lesions are noted compatible with  Bosniak class 2 lesions. No follow-up imaging recommended. Stomach/Bowel: Stomach appears normal. No bowel wall thickening, inflammation or distension identified. Vascular/Lymphatic: Normal caliber abdominal aorta. No upper abdominal adenopathy. Portal vein is patent. Other:  No significant ascites or focal fluid collections. Musculoskeletal: Lipoma within the right posterior paraspinal musculature measures 5.7 x 4.1 by 59.6 cm. No enhancing bone lesions identified. IMPRESSION: 1. Impacted stone within the distal cystic duct has mass effect upon the common bile duct. The stone measures 0.9 x 0.5 cm. Beyond this level the mid and distal common bile duct are nondilated without signs of distal choledocholithiasis. 2. Mild intrahepatic bile duct dilatation. Enhancement of the wall of the cystic duct and common bile duct noted. Findings are favored to reflect cholangitis. 3. Enhancing soft tissue within the gallbladder measures approximately 2.1 x 2.8 cm. This appears inseparable from the adjacent liver. Cannot exclude underlying gallbladder neoplasm. 4. Cirrhotic morphology of the liver with marked enlargement of the caudate lobe. No enhancing liver lesions identified. 5. Mild splenomegaly. 6. Lipoma within the right posterior paraspinal musculature measures 5.7 x 4.1 x 59.6 cm. Electronically Signed   By: Signa Kell M.D.   On: 11/23/2023 06:02    Anti-infectives: Anti-infectives (From admission, onward)    Start     Dose/Rate Route Frequency Ordered Stop   11/23/23 1800  piperacillin-tazobactam (ZOSYN) IVPB 3.375 g        3.375 g 12.5 mL/hr over 240 Minutes Intravenous Every 8 hours 11/23/23 1714     11/22/23 2200  cefadroxil (DURICEF) capsule 500 mg  Status:  Discontinued        500 mg Oral 2 times daily 11/22/23 1837 11/24/23 0857        Assessment/Plan Cholelithiasis, cholecystitis Jaundice Soft tissue mass within gallbladder? MRCP with Impacted stone at distal cystic duct (0.9 x 0.5 cm)  with mass effect on CBD  - AF, WBC nml - LFTs and t bili persistently elevated - t bili 10.2 - CT liver with changes consistent with cirrhosis without hepatic neoplasm - with level of juandice concerned the mass effect on CBD from cystic duct stone is not the sole etiology - no surgical intervention planned today. Patient can have CLD. Will discuss case with hepatobiliary surgeon and touch base with GI   FEN: CLD, NPO MN ID: [email or web address] VTE: okay for chemical prophylaxis from surgical standpoint  I reviewed Consultant GI notes, hospitalist notes, last 24 h vitals and pain scores, last 48 h intake and output, last 24 h labs and trends, and last 24 h imaging results.    LOS: 2 days   Eric Form, [facility] Surgery 11/24/2023, 10:57 AM Please see Amion for pager number during day hours 7:00am-4:30pm

## 2023-11-24 NOTE — Progress Notes (Addendum)
 Patient ID: [name]., male   DOB: 02/24/1961, 63 y.o.   MRN: [id number]     Attending physician's note   I have taken a history, reviewed the chart, and examined the patient. I performed a substantive portion of this encounter, including complete performance of at least one of the key components, in conjunction with the APP. I agree with the APP's note, impression, and recommendations with my edits.   Case discussed with the Surgical service and the advanced GI service.  Will plan for ERCP tomorrow with Dr. Leone Payor for stent placement and biliary decompression.  Discussed with patient this morning and again this afternoon.  Discussed risk/benefit profile of ERCP at length and he would like to proceed.  Did explain that the goal of ERCP would be for stent placement with biliary decompression, but not attempting removal of a cystic duct stone, and he is in full understanding.    William Mordecai, DO, FACG [phone] office          Progress Note   Subjective  Day # 2 CC; acute abdominal pain, fatigue malaise, poor appetite and new onset jaundice.  IV Zosyn Labs today-pro time 16.3/INR 1.3 WBC 5.0/hemoglobin 17/hematocrit 47.9/platelets 147 BUN 13/creatinine 1.14 T. bili 10.2/alk phos 135/AST 115/ALT 200  Patient says he feels about the same as yesterday, still having fairly constant pain no worse, afebrile overnight, no vomiting Anxious to move forward    Objective   Vital signs in last 24 hours: Temp:  [97.6 F (36.4 C)-98.3 F (36.8 C)] 98.3 F (36.8 C) (03/26 0737) Pulse Rate:  [75-83] 75 (03/26 0737) Resp:  [16-20] 20 (03/26 0737) BP: (107-119)/(73-84) 119/84 (03/26 0737) SpO2:  [95 %-98 %] 97 % (03/26 0737) Last BM Date : 11/23/23 General:    Older white male in NAD, jaundiced Heart:  Regular rate and rhythm; no murmurs Lungs: Respirations even and unlabored, lungs CTA bilaterally Abdomen:  Soft, obese, tender in the right upper quadrant/epigastrium  with no rebound. Normal bowel sounds. Extremities:  Without edema. Neurologic:  Alert and oriented,  grossly normal neurologically. Psych:  Cooperative. Normal mood and affect.  Intake/Output from previous day: 03/25 0701 - 03/26 0700 In: 162.9 [I.V.:162.9; IV Piggyback:0] Out: -  Intake/Output this shift: Total I/O In: 240 [P.O.:240] Out: -   Lab Results: Recent Labs    11/22/23 1615 11/23/23 0630 11/24/23 0608  WBC 5.6 4.8 5.0  HGB 18.3* 17.5* 17.0  HCT 52.7* 49.0 47.9  PLT 166 146* 147*   BMET Recent Labs    11/22/23 1615 11/23/23 0630 11/24/23 0608  NA 134* 134* 135  K 3.7 3.6 3.7  CL 103 104 103  CO2 21* 21* 20*  GLUCOSE 107* 81 94  BUN 11 9 13   CREATININE 1.28* 1.21 1.14  CALCIUM 9.5 8.4* 8.5*   LFT Recent Labs    11/24/23 0608  PROT 5.6*  ALBUMIN 2.9*  AST 115*  ALT 200*  ALKPHOS 135*  BILITOT 10.2*   PT/INR Recent Labs    11/23/23 1041 11/24/23 0608  LABPROT 18.9* 16.3*  INR 1.6* 1.3*    Studies/Results: CT LIVER ABDOMEN W WO CONTRAST Result Date: 11/23/2023 CLINICAL DATA:  Cirrhosis with jaundice. Eval for hepatic or extrahepatic lesions EXAM: CT ABDOMEN WITHOUT AND WITH CONTRAST TECHNIQUE: Multidetector CT imaging of the abdomen was performed following the standard protocol before and following the bolus administration of intravenous contrast. RADIATION DOSE REDUCTION: This exam was performed according to the departmental dose-optimization program which  includes automated exposure control, adjustment of the mA and/or kV according to patient size and/or use of iterative reconstruction technique. CONTRAST:  OMNIPAQUE IOHEXOL 350 MG/ML SOLN COMPARISON:  Right upper quadrant abdominal ultrasound 04/09/2021. Abdominal MRI 11/22/2023. FINDINGS: Lower chest: Clear lung bases. No significant pleural or pericardial effusion. The heart is mildly enlarged. There is aortic and coronary artery atherosclerosis with evidence of previous ASD closure.  Hepatobiliary: Morphologic changes of cirrhosis again noted with diffuse contour irregularity of the liver and enlargement of the caudate and left lobes. No arterial phase enhancing liver lesions are demonstrated. As on recent MRI, the gallbladder is contracted with mild wall thickening and probable surrounding inflammation. There are small calcified gallstones at the gallbladder fundus and within the gallbladder neck. Suspected partially calcified 5 mm calculus within the common hepatic duct (image 32/3). As seen on MRI, there is wall enhancement of the cystic and common hepatic ducts which are mildly dilated. There is also mild intrahepatic biliary dilatation. The common bile duct appears decompressed. Pancreas: No evidence of pancreatic mass, pancreatic ductal dilatation or surrounding inflammation. Spleen: Normal in size without focal abnormality. Adrenals/Urinary Tract: Both adrenal glands appear normal. No evidence of urinary tract calculus or hydronephrosis. There are small renal cysts bilaterally for which no specific follow-up imaging is recommended. On the delayed post-contrast images, there is a fluid-fluid level within the left renal pelvis without definite associated mass lesion or abnormal enhancement, likely physiologic. There is no perinephric soft tissue stranding. Bladder not imaged. Stomach/Bowel: No enteric contrast administered. The stomach appears unremarkable for its degree of distension. No evidence of bowel wall thickening, distention or surrounding inflammatory change.The appendix appears normal. Mild distal colonic diverticulosis. Vascular/Lymphatic: There are no enlarged abdominal lymph nodes. Aortic and branch vessel atherosclerosis without evidence of aneurysm or large vessel occlusion. Other: No evidence of abdominal wall hernia, ascites or pneumoperitoneum. Musculoskeletal: No acute or significant osseous findings. Multilevel spondylosis. Again noted is a lipomatous mass within the  right erector spinae muscles measuring 5.0 x 4.6 cm transverse and extending approximately 10.6 cm in length. This demonstrates no aggressive characteristics. IMPRESSION: 1. Morphologic changes of cirrhosis without evidence of hepatic neoplasm. 2. Cholelithiasis and suspected choledocholithiasis with mild gallbladder wall thickening and surrounding inflammation. There is also wall enhancement of the cystic and common hepatic ducts which are mildly dilated. Findings are suspicious for cholecystitis and cholangitis. Follow-up imaging or ERCP recommended to exclude biliary malignancy. 3. 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 4. Aortic atherosclerosis. Electronically Signed   By: Carey Bullocks M.D.   On: 11/23/2023 15:58   MR ABDOMEN WITH MRCP W CONTRAST Result Date: 11/23/2023 CLINICAL DATA:  Evaluate for choledocholithiasis. EXAM: MRI ABDOMEN WITHOUT AND WITH CONTRAST (INCLUDING MRCP) TECHNIQUE: Multiplanar multisequence MR imaging of the abdomen was performed both before and after the administration of intravenous contrast. Heavily T2-weighted images of the biliary and pancreatic ducts were obtained, and three-dimensional MRCP images were rendered by post processing. CONTRAST:  5mL GADAVIST GADOBUTROL 1 MMOL/ML IV SOLN COMPARISON:  None Available. FINDINGS: Lower chest: No acute findings. Hepatobiliary: Cirrhotic morphology of the liver is identified with marked enlargement of the caudate lobe. Contour the liver is nodular and there is widening of the falciform ligament. No enhancing liver lesions identified. The gallbladder appears decompressed around small stones. Enhancing soft tissue within the gallbladder measures approximately 2.1 x 2.8 cm, image 53/12 and image 79/1102. This appears inseparable from the adjacent liver. Mild intrahepatic bile duct dilatation.  Enhancement of the wall of the cystic duct and common bile duct noted. Cystic duct is dilated measuring 1  cm in diameter. Impacted stone within the distal cystic duct has mass effect upon the common bile duct. The stone measures 0.9 x 0.5 cm, image 21/3. Beyond this level the mid and distal common bile duct are nondilated without signs of choledocholithiasis. Pancreas: The pancreas appears edematous. No main duct dilatation or mass identified. No significant peripancreatic fat stranding or free fluid. Spleen: The spleen measures 13.4 cm in cranial caudal dimension. No focal splenic abnormality. Adrenals/Urinary Tract: No suspicious adrenal mass. No hydronephrosis. Small hemorrhagic cyst noted within the upper pole of the left kidney measuring 5 mm, Bosniak class 2. Additional, less than 1 cm T2 hyperintense kidney lesions are noted compatible with Bosniak class 2 lesions. No follow-up imaging recommended. Stomach/Bowel: Stomach appears normal. No bowel wall thickening, inflammation or distension identified. Vascular/Lymphatic: Normal caliber abdominal aorta. No upper abdominal adenopathy. Portal vein is patent. Other:  No significant ascites or focal fluid collections. Musculoskeletal: Lipoma within the right posterior paraspinal musculature measures 5.7 x 4.1 by 59.6 cm. No enhancing bone lesions identified. IMPRESSION: 1. Impacted stone within the distal cystic duct has mass effect upon the common bile duct. The stone measures 0.9 x 0.5 cm. Beyond this level the mid and distal common bile duct are nondilated without signs of distal choledocholithiasis. 2. Mild intrahepatic bile duct dilatation. Enhancement of the wall of the cystic duct and common bile duct noted. Findings are favored to reflect cholangitis. 3. Enhancing soft tissue within the gallbladder measures approximately 2.1 x 2.8 cm. This appears inseparable from the adjacent liver. Cannot exclude underlying gallbladder neoplasm. 4. Cirrhotic morphology of the liver with marked enlargement of the caudate lobe. No enhancing liver lesions identified. 5. Mild  splenomegaly. 6. Lipoma within the right posterior paraspinal musculature measures 5.7 x 4.1 x 59.6 cm. Electronically Signed   By: Signa Kell M.D.   On: 11/23/2023 06:02   MR 3D Recon At Scanner Result Date: 11/23/2023 CLINICAL DATA:  Evaluate for choledocholithiasis. EXAM: MRI ABDOMEN WITHOUT AND WITH CONTRAST (INCLUDING MRCP) TECHNIQUE: Multiplanar multisequence MR imaging of the abdomen was performed both before and after the administration of intravenous contrast. Heavily T2-weighted images of the biliary and pancreatic ducts were obtained, and three-dimensional MRCP images were rendered by post processing. CONTRAST:  5mL GADAVIST GADOBUTROL 1 MMOL/ML IV SOLN COMPARISON:  None Available. FINDINGS: Lower chest: No acute findings. Hepatobiliary: Cirrhotic morphology of the liver is identified with marked enlargement of the caudate lobe. Contour the liver is nodular and there is widening of the falciform ligament. No enhancing liver lesions identified. The gallbladder appears decompressed around small stones. Enhancing soft tissue within the gallbladder measures approximately 2.1 x 2.8 cm, image 53/12 and image 79/1102. This appears inseparable from the adjacent liver. Mild intrahepatic bile duct dilatation. Enhancement of the wall of the cystic duct and common bile duct noted. Cystic duct is dilated measuring 1 cm in diameter. Impacted stone within the distal cystic duct has mass effect upon the common bile duct. The stone measures 0.9 x 0.5 cm, image 21/3. Beyond this level the mid and distal common bile duct are nondilated without signs of choledocholithiasis. Pancreas: The pancreas appears edematous. No main duct dilatation or mass identified. No significant peripancreatic fat stranding or free fluid. Spleen: The spleen measures 13.4 cm in cranial caudal dimension. No focal splenic abnormality. Adrenals/Urinary Tract: No suspicious adrenal mass. No hydronephrosis. Small hemorrhagic cyst noted  within the  upper pole of the left kidney measuring 5 mm, Bosniak class 2. Additional, less than 1 cm T2 hyperintense kidney lesions are noted compatible with Bosniak class 2 lesions. No follow-up imaging recommended. Stomach/Bowel: Stomach appears normal. No bowel wall thickening, inflammation or distension identified. Vascular/Lymphatic: Normal caliber abdominal aorta. No upper abdominal adenopathy. Portal vein is patent. Other:  No significant ascites or focal fluid collections. Musculoskeletal: Lipoma within the right posterior paraspinal musculature measures 5.7 x 4.1 by 59.6 cm. No enhancing bone lesions identified. IMPRESSION: 1. Impacted stone within the distal cystic duct has mass effect upon the common bile duct. The stone measures 0.9 x 0.5 cm. Beyond this level the mid and distal common bile duct are nondilated without signs of distal choledocholithiasis. 2. Mild intrahepatic bile duct dilatation. Enhancement of the wall of the cystic duct and common bile duct noted. Findings are favored to reflect cholangitis. 3. Enhancing soft tissue within the gallbladder measures approximately 2.1 x 2.8 cm. This appears inseparable from the adjacent liver. Cannot exclude underlying gallbladder neoplasm. 4. Cirrhotic morphology of the liver with marked enlargement of the caudate lobe. No enhancing liver lesions identified. 5. Mild splenomegaly. 6. Lipoma within the right posterior paraspinal musculature measures 5.7 x 4.1 x 59.6 cm. Electronically Signed   By: Signa Kell M.D.   On: 11/23/2023 06:02       Assessment / Plan:    #35 63 year old white male with onset of upper abdominal pain, nausea, poor appetite and malaise since Friday, 11/19/2023 with fairly constant abdominal pain since Admitted yesterday after being noted to be jaundiced and found to have T. bili of 10.6  Workup including MRI/MRCP and CT has shown an impacted stone in the distal cystic duct with mass effect on the common bile duct, no  choledocholithiasis, there is mild intrahepatic ductal dilation and enhancement of the wall of the cystic and common bile duct concerning for cholangitis. There is also soft tissue enhancing within the gallbladder measuring 2.1 x 2.8 cm which appears inseparable from the adjacent liver, cannot rule out underlying gallbladder neoplasm.  Cirrhotic morphology of the liver and mild splenomegaly.  IV Zosyn started yesterday. Patient has been afebrile overnight Labs stable INR improved suspect Xarelto effect now washed out  Concern for underlying cirrhosis which if he has has to this point been compensated and very difficult to determine true MELD etc. in setting of jaundice.  We have discussed patient with surgery/Dr. Audrie Lia, and advanced biliary/Dr. Mansouraty-and after further discussions today surgery would like common bile duct stent to be placed for decompression of the bile duct, before considering any surgery.  Surgery may be delayed until next week.  Plan; continue clear liquids today, n.p.o. after midnight Patient has been scheduled for ERCP with stent placement with Dr. Leone Payor for tomorrow 11/25/2023.  Procedure has been discussed in detail with the patient including indications risks and benefits, reviewed biliary images, and he is agreeable to proceed.  He will need to be observed overnight after procedure tomorrow, trend LFTs etc. Patient was updated this afternoon. Continue Zosyn Hold Xarelto, no Lovenox tomorrow We will continue to follow with you.     Principal Problem:   Cystic duct calculus Active Problems:   Obstructive sleep apnea syndrome   History of transient ischemic attack   Essential hypertension   Polycythemia vera (HCC)   Permanent atrial fibrillation (HCC)   Type 2 diabetes mellitus (HCC)   (HFpEF) heart failure with preserved ejection fraction (HCC)   Staphylococcal  arthritis, right knee (HCC)   Liver cirrhosis (HCC)   Cholestasis   Cholangitis    Elevated bilirubin     LOS: 2 days   [name] EsterwoodPA-C  11/24/2023, 3:11 PM

## 2023-11-25 ENCOUNTER — Encounter (HOSPITAL_COMMUNITY): Payer: Self-pay | Admitting: Internal Medicine

## 2023-11-25 ENCOUNTER — Inpatient Hospital Stay (HOSPITAL_COMMUNITY)

## 2023-11-25 ENCOUNTER — Encounter (HOSPITAL_COMMUNITY)
Admission: AD | Disposition: A | Discharge status: Home / Self Care | Payer: Self-pay | Source: Ambulatory Visit | Attending: Internal Medicine

## 2023-11-25 DIAGNOSIS — I5030 Unspecified diastolic (congestive) heart failure: Secondary | ICD-10-CM | POA: Diagnosis not present

## 2023-11-25 DIAGNOSIS — K3189 Other diseases of stomach and duodenum: Secondary | ICD-10-CM

## 2023-11-25 DIAGNOSIS — G4733 Obstructive sleep apnea (adult) (pediatric): Secondary | ICD-10-CM

## 2023-11-25 DIAGNOSIS — I10 Essential (primary) hypertension: Secondary | ICD-10-CM | POA: Diagnosis not present

## 2023-11-25 DIAGNOSIS — K831 Obstruction of bile duct: Secondary | ICD-10-CM | POA: Diagnosis not present

## 2023-11-25 DIAGNOSIS — K2950 Unspecified chronic gastritis without bleeding: Secondary | ICD-10-CM

## 2023-11-25 DIAGNOSIS — K7460 Unspecified cirrhosis of liver: Secondary | ICD-10-CM | POA: Diagnosis not present

## 2023-11-25 DIAGNOSIS — K8031 Calculus of bile duct with cholangitis, unspecified, with obstruction: Secondary | ICD-10-CM | POA: Diagnosis not present

## 2023-11-25 DIAGNOSIS — K838 Other specified diseases of biliary tract: Secondary | ICD-10-CM

## 2023-11-25 DIAGNOSIS — Z8619 Personal history of other infectious and parasitic diseases: Secondary | ICD-10-CM | POA: Diagnosis not present

## 2023-11-25 DIAGNOSIS — K8020 Calculus of gallbladder without cholecystitis without obstruction: Secondary | ICD-10-CM | POA: Diagnosis not present

## 2023-11-25 DIAGNOSIS — K2970 Gastritis, unspecified, without bleeding: Secondary | ICD-10-CM | POA: Diagnosis not present

## 2023-11-25 HISTORY — PX: ESOPHAGOGASTRODUODENOSCOPY: SHX5428

## 2023-11-25 HISTORY — PX: ERCP: SHX5425

## 2023-11-25 HISTORY — PX: BILIARY STENT PLACEMENT: SHX5538

## 2023-11-25 LAB — COMPREHENSIVE METABOLIC PANEL WITH GFR
ALT: 155 U/L — ABNORMAL HIGH (ref 0–44)
AST: 83 U/L — ABNORMAL HIGH (ref 15–41)
Albumin: 2.9 g/dL — ABNORMAL LOW (ref 3.5–5.0)
Alkaline Phosphatase: 138 U/L — ABNORMAL HIGH (ref 38–126)
Anion gap: 9 (ref 5–15)
BUN: 11 mg/dL (ref 8–23)
CO2: 23 mmol/L (ref 22–32)
Calcium: 8.6 mg/dL — ABNORMAL LOW (ref 8.9–10.3)
Chloride: 103 mmol/L (ref 98–111)
Creatinine, Ser: 1.11 mg/dL (ref 0.61–1.24)
GFR, Estimated: >60 mL/min (ref >60)
Glucose, Bld: 100 mg/dL — ABNORMAL HIGH (ref 70–99)
Potassium: 3.8 mmol/L (ref 3.5–5.1)
Sodium: 135 mmol/L (ref 135–145)
Total Bilirubin: 12.8 mg/dL — ABNORMAL HIGH (ref 0.0–1.2)
Total Protein: 5.8 g/dL — ABNORMAL LOW (ref 6.5–8.1)

## 2023-11-25 LAB — CBC
HCT: 49.3 % (ref 39.0–52.0)
Hemoglobin: 17.9 g/dL — ABNORMAL HIGH (ref 13.0–17.0)
MCH: 28.7 pg (ref 26.0–34.0)
MCHC: 36.3 g/dL — ABNORMAL HIGH (ref 30.0–36.0)
MCV: 79 fL — ABNORMAL LOW (ref 80.0–100.0)
Platelets: 147 10*3/uL — ABNORMAL LOW (ref 150–400)
RBC: 6.24 MIL/uL — ABNORMAL HIGH (ref 4.22–5.81)
RDW: 18.6 % — ABNORMAL HIGH (ref 11.5–15.5)
WBC: 4.8 10*3/uL (ref 4.0–10.5)
nRBC: 0 % (ref 0.0–0.2)

## 2023-11-25 LAB — HCV INTERPRETATION

## 2023-11-25 LAB — HEPATITIS B CORE ANTIBODY, TOTAL: HEP B CORE AB: NEGATIVE

## 2023-11-25 LAB — HCV AB W REFLEX TO QUANT PCR: HCV Ab: NONREACTIVE

## 2023-11-25 SURGERY — ERCP, WITH INTERVENTION IF INDICATED
Anesthesia: General

## 2023-11-25 MED ORDER — SODIUM CHLORIDE 0.9 % IV SOLN
INTRAVENOUS | Status: DC | PRN
  Administered 2023-11-25: 20 mL

## 2023-11-25 MED ORDER — DEXAMETHASONE SODIUM PHOSPHATE 10 MG/ML IJ SOLN
INTRAMUSCULAR | Status: DC | PRN
  Administered 2023-11-25: 10 mg via INTRAVENOUS

## 2023-11-25 MED ORDER — PROPOFOL 10 MG/ML IV BOLUS
INTRAVENOUS | Status: DC | PRN
  Administered 2023-11-25: 180 mg via INTRAVENOUS

## 2023-11-25 MED ORDER — SODIUM CHLORIDE 0.9 % IV SOLN: INTRAVENOUS | Status: DC | PRN

## 2023-11-25 MED ORDER — ROCURONIUM BROMIDE 10 MG/ML (PF) SYRINGE
PREFILLED_SYRINGE | INTRAVENOUS | Status: DC | PRN
  Administered 2023-11-25: 60 mg via INTRAVENOUS

## 2023-11-25 MED ORDER — DICLOFENAC SUPPOSITORY 100 MG
RECTAL | Status: AC
  Filled 2023-11-25: qty 1

## 2023-11-25 MED ORDER — SUGAMMADEX SODIUM 200 MG/2ML IV SOLN
INTRAVENOUS | Status: DC | PRN
  Administered 2023-11-25: 200 mg via INTRAVENOUS

## 2023-11-25 MED ORDER — GLUCAGON HCL RDNA (DIAGNOSTIC) 1 MG IJ SOLR
INTRAMUSCULAR | Status: DC | PRN
  Administered 2023-11-25: .5 mg via INTRAVENOUS

## 2023-11-25 MED ORDER — LIDOCAINE 2% (20 MG/ML) 5 ML SYRINGE
INTRAMUSCULAR | Status: DC | PRN
  Administered 2023-11-25: 60 mg via INTRAVENOUS

## 2023-11-25 MED ORDER — PHENYLEPHRINE 80 MCG/ML (10ML) SYRINGE FOR IV PUSH (FOR BLOOD PRESSURE SUPPORT)
PREFILLED_SYRINGE | INTRAVENOUS | Status: DC | PRN
  Administered 2023-11-25 (×3): 240 ug via INTRAVENOUS
  Administered 2023-11-25 (×2): 160 ug via INTRAVENOUS

## 2023-11-25 MED ORDER — GLUCAGON HCL RDNA (DIAGNOSTIC) 1 MG IJ SOLR
INTRAMUSCULAR | Status: AC
  Filled 2023-11-25: qty 1

## 2023-11-25 MED ORDER — DICLOFENAC SUPPOSITORY 100 MG
100.0000 mg | Freq: Once | RECTAL | Status: AC
  Administered 2023-11-25: 100 mg via RECTAL

## 2023-11-25 NOTE — Anesthesia Procedure Notes (Signed)
 Procedure Name: Intubation Date/Time: 11/25/2023 10:18 AM  Performed by: Georgianne Fick D, CRNAPre-anesthesia Checklist: Patient identified, Emergency Drugs available, Suction available and Patient being monitored Patient Re-evaluated:Patient Re-evaluated prior to induction Oxygen Delivery Method: Circle System Utilized Preoxygenation: Pre-oxygenation with 100% oxygen Induction Type: IV induction Ventilation: Mask ventilation without difficulty Laryngoscope Size: Mac and 4 Grade View: Grade II Tube type: Oral Tube size: 7.5 mm Number of attempts: 1 Airway Equipment and Method: Stylet and Oral airway Placement Confirmation: ETT inserted through vocal cords under direct vision, positive ETCO2 and breath sounds checked- equal and bilateral Secured at: 22 cm Tube secured with: Tape Dental Injury: Teeth and Oropharynx as per pre-operative assessment

## 2023-11-25 NOTE — Interval H&P Note (Signed)
 History and Physical Interval Note:  11/25/2023 10:08 AM  [name].  has presented today for surgery, with the diagnosis of Biliary obstruction, obstructed common bile duct, jaundice.  The various methods of treatment have been discussed with the patient and family. After consideration of risks, benefits and other options for treatment, the patient has consented to  Procedure(s) with comments: ERCP, WITH INTERVENTION IF INDICATED (N/A) - With stent placement as a surgical intervention.  The patient's history has been reviewed, patient examined, no change in status, stable for surgery.  I have reviewed the patient's chart and labs.  Questions were answered to the patient's satisfaction.     Stan Head

## 2023-11-25 NOTE — Progress Notes (Signed)
 HD#3 Subjective:   Summary: William Hellen. is a 63 y.o. 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 No chest pain or SOB. States that he has ERCP scheduled for today. No other questions today.   Objective:  Vital signs in last 24 hours: Vitals:   11/25/23 1140 11/25/23 1145 11/25/23 1150 11/25/23 1155  BP: (!) 105/91  108/82   Pulse: 81 84 75 81  Resp: 20 13 16 19   Temp:      TempSrc:      SpO2: 98% 97% 97% 96%  Weight:      Height:       Supplemental O2: Room Air  Physical Exam:   Constitutional: Laying in bed, no acute distress  HENT: normocephalic atraumatic, mucous membranes moist Eyes: Scleral Icterus  Neck: supple Cardiovascular: regular rate and rhythm  Pulmonary/Chest: normal work of breathing on room air, lungs clear to auscultation bilaterally Abdominal: mild epigastric tenderness  MSK: normal bulk and tone Neurological: Awake, Alert, answering questions appropriately  Skin: warm and dry, Juandice   Filed Weights   11/22/23 1830  Weight: 112.3 kg     Intake/Output Summary (Last 24 hours) at 11/25/2023 1204 Last data filed at 11/25/2023 1110 Gross per 24 hour  Intake 880 ml  Output 700 ml  Net 180 ml   Net IO Since Admission: -157.13 mL [11/25/23 1204]  Pertinent Labs:    Latest Ref Rng & Units 11/25/2023    6:09 AM 11/24/2023    6:08 AM 11/23/2023    6:30 AM  CBC  WBC 4.0 - 10.5 K/uL 4.8  5.0  4.8   Hemoglobin 13.0 - 17.0 g/dL 16.1  09.6  04.5   Hematocrit 39.0 - 52.0 % 49.3  47.9  49.0   Platelets 150 - 400 K/uL 147  147  146        Latest  Ref Rng & Units 11/25/2023    6:09 AM 11/24/2023    6:08 AM 11/23/2023    6:30 AM  CMP  Glucose 70 - 99 mg/dL 409  94  81   BUN 8 - 23 mg/dL 11  13  9    Creatinine 0.61 - 1.24 mg/dL 8.11  9.14  7.82   Sodium 135 - 145 mmol/L 135  135  134   Potassium 3.5 - 5.1 mmol/L 3.8  3.7  3.6   Chloride 98 - 111 mmol/L 103  103  104   CO2 22 - 32 mmol/L 23  20  21    Calcium 8.9 - 10.3 mg/dL 8.6  8.5  8.4   Total Protein 6.5 - 8.1 g/dL 5.8  5.6  5.7   Total Bilirubin 0.0 - 1.2 mg/dL 95.6  21.3  08.6   Alkaline Phos 38 - 126 U/L 138  135  130   AST 15 - 41 U/L 83  115  229   ALT 0 - 44 U/L 155  200  298     Imaging: No results found.  Assessment/Plan:   Principal Problem:   Cystic duct calculus Active Problems:   Obstructive sleep apnea syndrome   Gastropathy   History of transient ischemic  attack   Essential hypertension   Polycythemia vera (HCC)   Permanent atrial fibrillation (HCC)   Type 2 diabetes mellitus (HCC)   (HFpEF) heart failure with preserved ejection fraction (HCC)   Staphylococcal arthritis, right knee (HCC)   Liver cirrhosis (HCC)   Cholestasis   Cholangitis   Elevated bilirubin   Obstructive jaundice  [name]. is a 63 y.o. 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    Cholestasis with liver injury, possibly multifactorial MRCP with impacted cystic duct and compression of proximal CBD Enhancing gallbladder mass with invasion into adjacent liver Liver cirrhosis (newly diagnosed) Mild Splenomegaly Patient has markedly elevated T bili > 10, splenomegaly, liver cirrhosis and mild intrahepatic bile duct dilatation noted in the MRCP. GI ordered workup for newly diagnosed liver  cirrhosis, anti-smooth muscle antibody negative, mitochondrial antibody negative, ANA negative, alpha-1 antitrypsin WNL, Ceruplasmin WNL. Hepatitis serologies negative. GI and general surgery on board, ERCP with stent placement today    -Follow up GI recommendation -Follow up Gen surg recommendation     Acute cholangitis Jaundice  MRCP findings impacted stone within the distal cystic duct has mass effect upon the common bile duct.  Beyond this level the mid and distal common bile ducts are nondilated without signs of distal choledocholithiasis.  Enhancement of the wall of the cystic duct and common bile duct noted favoring cholangitis. There is also a concern for neoplasm of the gallbladder. General surgery consulted, appreciate their assistance with this case. ERCP today.  - PRN Robaxin 500 mg q6h  - Tylenol 500 mg every 6 hours - PRN Dilaudid 0.5 mg every 4 hours for severe pain  - PRN Zofran q6h for nausea  - IV Zosyn    Permanent Afib - Rate controlled - Hold home medication Xarelto for procedure.    T2DM Home medication Mounjaro 10 mg weekly and Jardiance 10 mg daily.  - Hold Jardiance to optimize blood pressures    GERD - Continue home medication Protonix EC 40 mg daily    Polycythemia Vera JAK 2 negative  Patient is supposed to go for routine phlebotomy to keep HCT down, has not been going for a while. Hgb 18.3 and HCT 52.7, will need to address outpatient.    HFpEF [EF 55-60%] GDMT consists of Entresto 24-26 mg BID, Coreg 12.5 BID, and Jardiance 10 mg daily - Will continue Entresto 24-26 mg BID - Hold Coreg and Jardiance to optimize blood pressures    Hx of Prosthetic Joint infection with MSSA on chronic antibiotic suppression therapy  Follows ID clinic outpatient. Reports that he is not taking Augmentin. - Hold Cefadroxil 500 mg BID, on Zosyn     Lipoma  - Incidental finding noted on MRCP at right posterior paraspinal musculature measures.  - Will need OP follow up    Diet:  NPO IVF: None  VTE: SCDs Wounds: None Code: Full PT/OT recs: Pending  TOC recs: none Family Update: Wife    Dispo: Anticipated discharge to Home in 4 days pending medical management.   Signature: Jeral Pinch, D.O.  Internal Medicine Resident, PGY-1 [facility]  Pager: [phone] 12:04 PM, 11/25/2023   Please contact the on call pager after 5 pm and on weekends at [phone].

## 2023-11-25 NOTE — Op Note (Addendum)
 [facility] Patient Name: [name] Procedure Date : 11/25/2023 MRN: [id number] Attending MD: Iva Boop , MD, [id number] Date of Birth: August 06, 1961 CSN: [id number] Age: 63 Admit Type: Inpatient Procedure:                ERCP Indications:              Jaundice, biliary obstruction from impacted cystic                            duct stone causing extrinsic compression. Imaging                            suggests cholangitis and a new diagnosis of                            cirrhosis. There is a soft tissue lesion of                            gallbladder also. Providers:                Iva Boop, MD Referring MD:             Reymundo Poll Medicines:                General Anesthesia, On continuous and intermittent                            Zosyn on floor                           diclofenac 100 mg per rectum Complications:            No immediate complications. Estimated Blood Loss:     Estimated blood loss was minimal. Procedure:                Pre-Anesthesia Assessment:                           - Prior to the procedure, a History and Physical                            was performed, and patient medications and                            allergies were reviewed. The patient's tolerance of                            previous anesthesia was also reviewed. The risks                            and benefits of the procedure and the sedation                            options and risks were discussed with the patient.                            All questions  were answered, and informed consent                            was obtained. Prior Anticoagulants: The patient                            last took Xarelto (rivaroxaban) 4 days prior to the                            procedure. ASA Grade Assessment: III - A patient                            with severe systemic disease. After reviewing the                            risks and benefits, the patient was  deemed in                            satisfactory condition to undergo the procedure.                           After obtaining informed consent, the scope was                            passed under direct vision. Throughout the                            procedure, the patient's blood pressure, pulse, and                            oxygen saturations were monitored continuously. The                            [employer] D single use                            duodenoscope was introduced through the mouth, and                            used to inject contrast into and used to inject                            contrast into the bile duct. The GIF-H190 ([id number])                            Olympus endoscope was introduced through the mouth,                            and used to inject contrast into and used to inject                            contrast into the dorsal and ventral pancreatic  ducts. The ERCP was somewhat difficult. Successful                            completion of the procedure was aided by long                            position. The patient tolerated the procedure well. Scope In: Scope Out: Findings:      A standard esophagogastroduodenoscopy scope was used for the examination       of the upper gastrointestinal tract. The scope was passed under direct       vision through the upper GI tract. Diffuse mucosal changes characterized       by erythema and petechiae were found in the gastric fundus and in the       gastric body. Portal gastropathy vs gastritis. The upper GI exam was       otherwise without abnormality. No varices. Biopsies were taken in the       gastric body through the esophagogastroduodenoscope with the cold       forceps for histology. The scout film was normal. The duodenoscope was       inserted and advanced to the duodenum. major papilla was found by using       the long position and I was unable to  maintain view when shortening       scope. The papilla was small but otherwise normal. No bile was seen. The       bile duct was deepply cannulated with Hydratome 035 wire via the       Hydrotome sphincterotome. Contrast injected and the bilary tree was       adequately filled in the main system and I personally interpreted the       images. Did not see bile but purulent fluid did drain throughout the       case. The known cystic duct stone was seen and it was compressing the       main bile duct causing mild upstream dilation of that and also more       dilated cystic duct the wire was in the common hepatic duct area and the       intrahepatics filled also though not fully vs some pruning with       suspected cirrhosis. No stones seen except cystic duct stone. Given the       need for long position I elected to perform a 3 mm biliary       sphincterotomy oriented to 12 o'clock and theer was increased purulent       fluid seen. I then placed a 10 Fr 7 cm plastic biliary stent into the       main bile duct and proximal aspect above the level of cystic duct stone       compression. Continued purulent drainage seen. Pancreas not entered. Impression:               - Erythematous and petechial mucosa in the gastric                            fundus and gastric body. Gastritis vs portal                            gastropathy. No varices noted.                           -  Biopsies were taken with a cold forceps for                            histology in the gastric body.                           - The upper third of the main bile duct was mildly                            dilated, with extrinsic compression from an                            impacted cystic duct stone causing an obstruction.                            There was associated cholangitis and purulent                            drainage from papilla.                           - One plastic stent was placed into the bile duct.                             10 fr 7 cm stent placed beyond area of compressed                            common bile duct. Recommendation:           - Return patient to hospital ward for ongoing care.                           Continue Zosyn                           F/U LFT's - The stent is in good position, did not                            see bile flow, could take some time to return and                            LFT's decrease. Also gallbladder and possibly liver                            (cirrhosis) contributing to jaundice.                           Further plans pending clincal course defer to                            surgery re: timing and site of cholecystectomy                           [facility] team to follow.  F/U Gatsric biopsies.                           Clear liquids today - advance tomorrow if ok                           Wife updated by phone                           Leave off Xarelto for now Procedure Code(s):        --- Professional ---                           [phone], Endoscopic retrograde                            cholangiopancreatography (ERCP); with placement of                            endoscopic stent into biliary or pancreatic duct,                            including pre- and post-dilation and guide wire                            passage, when performed, including sphincterotomy,                            when performed, each stent                           43239, 59, Esophagogastroduodenoscopy, flexible,                            transoral; with biopsy, single or multiple Diagnosis Code(s):        --- Professional ---                           K31.89, Other diseases of stomach and duodenum                           K29.70, Gastritis, unspecified, without bleeding                           K83.1, Obstruction of bile duct                           R17, Unspecified jaundice CPT copyright 2022 [employer]. All rights  reserved. The codes documented in this report are preliminary and upon coder review may  be revised to meet current compliance requirements. Iva Boop, MD 11/25/2023 11:47:40 AM This report has been signed electronically. Number of Addenda: 0

## 2023-11-25 NOTE — Progress Notes (Signed)
 I was unable to see the patient in person today.  In the morning he was in a procedure and this afternoon when I stopped by he was off the unit.  Per chart review, patient's ERCP showed CBD compression related to a cystic duct stone as well as signs of cholangitis.  A stent was placed successfully.  - We will discuss timing of potential intervention pending the resolution of his jaundice and response to treatment for cholangitis, likely early next week at the earlier - Continue antibiotics - Trend LFTs - Surgery will continue to follow  Melody Haver, MD   General Surgeon [facility], [place]

## 2023-11-25 NOTE — Plan of Care (Signed)

## 2023-11-25 NOTE — Anesthesia Postprocedure Evaluation (Signed)
 Anesthesia Post Note  Patient: [name].  Procedure(s) Performed: ERCP, WITH INTERVENTION IF INDICATED     Patient location during evaluation: PACU Anesthesia Type: General Level of consciousness: awake and alert Pain management: pain level controlled Vital Signs Assessment: post-procedure vital signs reviewed and stable Respiratory status: spontaneous breathing, nonlabored ventilation and respiratory function stable Cardiovascular status: stable and blood pressure returned to baseline Anesthetic complications: no  No notable events documented.  Last Vitals:  Vitals:   11/25/23 1150 11/25/23 1155  BP: 108/82   Pulse: 75 81  Resp: 16 19  Temp:    SpO2: 97% 96%    Last Pain:  Vitals:   11/25/23 1135  TempSrc:   PainSc: 0-No pain                 Beryle Lathe

## 2023-11-25 NOTE — Transfer of Care (Signed)
 Immediate Anesthesia Transfer of Care Note  Patient: [name].  Procedure(s) Performed: ERCP, WITH INTERVENTION IF INDICATED  Patient Location: Endoscopy Unit  Anesthesia Type:General  Level of Consciousness: awake, alert , and oriented  Airway & Oxygen Therapy: Patient Spontanous Breathing and Patient connected to nasal cannula oxygen  Post-op Assessment: Report given to RN and Post -op Vital signs reviewed and stable  Post vital signs: Reviewed and stable  Last Vitals:  Vitals Value Taken Time  BP    Temp    Pulse    Resp 18 11/25/23 1113  SpO2    Vitals shown include unfiled device data.  Last Pain:  Vitals:   11/25/23 0941  TempSrc: Temporal  PainSc: 4          Complications: No notable events documented.

## 2023-11-25 NOTE — Plan of Care (Signed)
  Problem: Clinical Measurements: Goal: Ability to maintain clinical measurements within normal limits will improve Outcome: Progressing Goal: Will remain free from infection Outcome: Progressing   Problem: Activity: Goal: Risk for activity intolerance will decrease Outcome: Progressing   Problem: Elimination: Goal: Will not experience complications related to bowel motility Outcome: Progressing Goal: Will not experience complications related to urinary retention Outcome: Progressing   Problem: Pain Managment: Goal: General experience of comfort will improve and/or be controlled Outcome: Progressing   Problem: Safety: Goal: Ability to remain free from injury will improve Outcome: Progressing   Problem: Skin Integrity: Goal: Risk for impaired skin integrity will decrease Outcome: Progressing

## 2023-11-25 NOTE — Anesthesia Preprocedure Evaluation (Addendum)
 Anesthesia Evaluation  Patient identified by MRN, date of birth, ID band Patient awake    Reviewed: Allergy & Precautions, NPO status , Patient's Chart, lab work & pertinent test results  History of Anesthesia Complications Negative for: history of anesthetic complications  Airway Mallampati: III  TM Distance: >3 FB Neck ROM: Full    Dental  (+) Dental Advisory Given, Teeth Intact   Pulmonary sleep apnea and Continuous Positive Airway Pressure Ventilation    Pulmonary exam normal        Cardiovascular hypertension, Pt. on home beta blockers and Pt. on medications Normal cardiovascular exam+ dysrhythmias Atrial Fibrillation    '23 TTE - EF 55 to 60%. There is moderate left ventricular hypertrophy. The right ventricular size is mildly enlarged. Left atrial size was mildly dilated. PFO closure. Right atrial size was mildly dilated. Aortic dilatation noted. There is mild dilatation of the ascending aorta, measuring 41 mm.     Neuro/Psych TIA negative psych ROS   GI/Hepatic ,GERD  Controlled,,(+) Cirrhosis         Endo/Other  diabetes, Type 2, Oral Hypoglycemic Agents   Obesity   Renal/GU negative Renal ROS     Musculoskeletal  (+) Arthritis ,    Abdominal   Peds  Hematology  On xarelto PRV Plt 147k    Anesthesia Other Findings On GLP-1a HSV  Reproductive/Obstetrics                             Anesthesia Physical Anesthesia Plan  ASA: 3  Anesthesia Plan: General   Post-op Pain Management: Minimal or no pain anticipated   Induction: Intravenous  PONV Risk Score and Plan: 2 and Treatment may vary due to age or medical condition, Ondansetron and Dexamethasone  Airway Management Planned: Oral ETT  Additional Equipment: None  Intra-op Plan:   Post-operative Plan: Extubation in OR  Informed Consent: I have reviewed the patients History and Physical, chart, labs and discussed  the procedure including the risks, benefits and alternatives for the proposed anesthesia with the patient or authorized representative who has indicated his/her understanding and acceptance.     Dental advisory given  Plan Discussed with: CRNA and Anesthesiologist  Anesthesia Plan Comments:        Anesthesia Quick Evaluation

## 2023-11-26 ENCOUNTER — Encounter (HOSPITAL_COMMUNITY): Payer: Self-pay | Admitting: Internal Medicine

## 2023-11-26 DIAGNOSIS — K8309 Other cholangitis: Secondary | ICD-10-CM | POA: Diagnosis not present

## 2023-11-26 DIAGNOSIS — Z8619 Personal history of other infectious and parasitic diseases: Secondary | ICD-10-CM | POA: Diagnosis not present

## 2023-11-26 DIAGNOSIS — K8020 Calculus of gallbladder without cholecystitis without obstruction: Secondary | ICD-10-CM | POA: Diagnosis not present

## 2023-11-26 DIAGNOSIS — K7460 Unspecified cirrhosis of liver: Secondary | ICD-10-CM | POA: Diagnosis not present

## 2023-11-26 DIAGNOSIS — I5030 Unspecified diastolic (congestive) heart failure: Secondary | ICD-10-CM | POA: Diagnosis not present

## 2023-11-26 LAB — COMPREHENSIVE METABOLIC PANEL WITH GFR
ALT: 111 U/L — ABNORMAL HIGH (ref 0–44)
AST: 50 U/L — ABNORMAL HIGH (ref 15–41)
Albumin: 2.7 g/dL — ABNORMAL LOW (ref 3.5–5.0)
Alkaline Phosphatase: 126 U/L (ref 38–126)
Anion gap: 13 (ref 5–15)
BUN: 13 mg/dL (ref 8–23)
CO2: 22 mmol/L (ref 22–32)
Calcium: 8.8 mg/dL — ABNORMAL LOW (ref 8.9–10.3)
Chloride: 101 mmol/L (ref 98–111)
Creatinine, Ser: 1.26 mg/dL — ABNORMAL HIGH (ref 0.61–1.24)
GFR, Estimated: >60 mL/min (ref >60)
Glucose, Bld: 132 mg/dL — ABNORMAL HIGH (ref 70–99)
Potassium: 3.9 mmol/L (ref 3.5–5.1)
Sodium: 136 mmol/L (ref 135–145)
Total Bilirubin: 7.7 mg/dL — ABNORMAL HIGH (ref 0.0–1.2)
Total Protein: 5.6 g/dL — ABNORMAL LOW (ref 6.5–8.1)

## 2023-11-26 LAB — CBC
HCT: 46.7 % (ref 39.0–52.0)
Hemoglobin: 17.1 g/dL — ABNORMAL HIGH (ref 13.0–17.0)
MCH: 29 pg (ref 26.0–34.0)
MCHC: 36.6 g/dL — ABNORMAL HIGH (ref 30.0–36.0)
MCV: 79.2 fL — ABNORMAL LOW (ref 80.0–100.0)
Platelets: 135 10*3/uL — ABNORMAL LOW (ref 150–400)
RBC: 5.9 MIL/uL — ABNORMAL HIGH (ref 4.22–5.81)
RDW: 18.2 % — ABNORMAL HIGH (ref 11.5–15.5)
WBC: 6.4 10*3/uL (ref 4.0–10.5)
nRBC: 0 % (ref 0.0–0.2)

## 2023-11-26 LAB — SURGICAL PATHOLOGY

## 2023-11-26 MED ORDER — LACTATED RINGERS IV SOLN: INTRAVENOUS | Status: DC

## 2023-11-26 MED ORDER — SODIUM CHLORIDE 0.9 % IV SOLN: INTRAVENOUS | Status: AC

## 2023-11-26 NOTE — Progress Notes (Signed)
 HD#4 Subjective:   Summary: William Bley. is a 63 y.o. 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 Denies any nausea, vomiting, or abdominal pain. States that he wants to go home, but is hoping that the surgical procedure will be soon. Reports he had a bowel movement this AM, that was not as chalky.   Objective:  Vital signs in last 24 hours: Vitals:   11/25/23 1534 11/25/23 2004 11/26/23 0334 11/26/23 0700  BP: 123/84 126/81 106/72 116/73  Pulse: 82 76 61 61  Resp: 17 18  17   Temp: 97.6 F (36.4 C)  98.2 F (36.8 C) 99.7 F (37.6 C)  TempSrc:    Oral  SpO2: 96% 99% 97% 95%  Weight:      Height:       Supplemental O2: Room Air  SpO2: 95 % O2 Flow Rate (L/min): 2.5 L/min   Physical Exam:  Constitutional: laying in bed, no acute distress HENT: normocephalic atraumatic, mucous membranes moist Eyes: Scleral icterus  Neck: supple Cardiovascular: regular rate and rhythm Pulmonary/Chest: normal work of breathing on room air, lungs clear to auscultation bilaterally Abdominal: Obese, soft, non tender  MSK: normal bulk and tone Neurological: Awake, Alert, answering questions appropriately  Skin: warm and dry  Filed Weights   11/22/23 1830  Weight: 112.3 kg     Intake/Output Summary (Last 24 hours) at 11/26/2023 1050 Last data filed at 11/25/2023 1500 Gross per 24 hour  Intake 760 ml  Output --  Net 760 ml   Net IO Since Admission: -97.13 mL [11/26/23 1050]  Pertinent Labs:    Latest Ref Rng & Units 11/26/2023    5:55 AM 11/25/2023    6:09 AM 11/24/2023    6:08 AM  CBC  WBC  4.0 - 10.5 K/uL 6.4  4.8  5.0   Hemoglobin 13.0 - 17.0 g/dL 16.1  09.6  04.5   Hematocrit 39.0 - 52.0 % 46.7  49.3  47.9   Platelets 150 - 400 K/uL 135  147  147        Latest Ref Rng & Units 11/26/2023    5:55 AM 11/25/2023    6:09 AM 11/24/2023    6:08 AM  CMP  Glucose 70 - 99 mg/dL 409  811  94   BUN 8 - 23 mg/dL 13  11  13    Creatinine 0.61 - 1.24 mg/dL 9.14  7.82  9.56   Sodium 135 - 145 mmol/L 136  135  135   Potassium 3.5 - 5.1 mmol/L 3.9  3.8  3.7   Chloride 98 - 111 mmol/L 101  103  103   CO2 22 - 32 mmol/L 22  23  20    Calcium 8.9 - 10.3 mg/dL 8.8  8.6  8.5   Total Protein 6.5 - 8.1 g/dL 5.6  5.8  5.6   Total Bilirubin 0.0 - 1.2 mg/dL 7.7  21.3  08.6   Alkaline Phos 38 - 126 U/L 126  138  135   AST 15 - 41 U/L 50  83  115   ALT 0 - 44 U/L 111  155  200     Imaging:  DG ERCP Result Date: 11/26/2023 CLINICAL DATA:  Cirrhosis, jaundice, cholelithiasis, possible choledocholithiasis EXAM: ERCP TECHNIQUE: Multiple spot images obtained with the fluoroscopic device and submitted for interpretation post-procedure. COMPARISON:  CT 11/23/2023 FINDINGS: A series of fluoroscopic spot images document endoscopic cannulation and opacification of the biliary tree. Filling defect just distal to the confluence of the biliary ducts. Subsequent placement of plastic biliary stent. IMPRESSION: 1. ERCP with biliary stent placement. 2. These images were submitted for radiologic interpretation only. These images were submitted for radiologic interpretation only. Please see the procedural report for the amount of contrast and the fluoroscopy time utilized. Electronically Signed   By: Corlis Leak M.D.   On: 11/26/2023 08:00    Assessment/Plan:   Principal Problem:   Cystic duct calculus Active Problems:   Obstructive sleep apnea syndrome   Gastropathy   History of transient ischemic attack   Essential hypertension   Polycythemia vera (HCC)   Permanent atrial fibrillation (HCC)   Type 2 diabetes  mellitus (HCC)   (HFpEF) heart failure with preserved ejection fraction (HCC)   Staphylococcal arthritis, right knee (HCC)   Liver cirrhosis (HCC)   Cholestasis   Cholangitis   Elevated bilirubin   Obstructive jaundice  William Burmester. is a 63 y.o. 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    MRCP with impacted cystic duct and compression of proximal CBD Acute cholangitis Acute cholecystitis  Jaundice  MRCP findings impacted stone within the distal cystic duct has mass effect upon the common bile duct.  Beyond this level the mid and distal common bile ducts are nondilated without signs of distal choledocholithiasis.  Enhancement of the wall of the cystic duct and common bile duct noted favoring cholangitis. There is also a concern for neoplasm of the gallbladder in the imaging, however likely in the setting of inflammation. GI and general surgery on board, S/p ERCP with placement of stent in CBD with evidence of purulent drainage. Planned for surgery on Monday 3/31 at 7:30 AM for cholecystectomy. If liver function panel continue to downtrend tomorrow morning, he can go home and come back for surgery. Will re-evaluate tomorrow.  - Continue to hold Xarelto   - Advance diet as patient tolerates  - PRN Robaxin 500 mg q6h  - Tylenol 500 mg every 6 hours - PRN Dilaudid 0.5 mg every 4 hours for severe pain  - PRN Zofran q6h for nausea  - IV Zosyn for now, but if discharged, can transition to PO Augmentin    Liver cirrhosis (newly diagnosed) likely 2/2 hepatic steatosis  Mild Splenomegaly  Patient has markedly elevated T bili > 10, splenomegaly, liver cirrhosis and mild intrahepatic bile duct dilatation noted in  the MRCP. GI ordered workup for newly diagnosed liver cirrhosis, anti-smooth muscle antibody negative, mitochondrial antibody negative, ANA negative, alpha-1 antitrypsin WNL, Ceruplasmin WNL. Hepatitis serologies negative. GI will plan to follow outpatient for the cirrhosis management - OP follow up with GI   Permanent Afib - Rate controlled - Hold home medication Xarelto for procedure on Monday    T2DM Home medication Mounjaro 10 mg weekly and Jardiance 10 mg daily.  - Hold Jardiance to optimize blood pressures    GERD - Continue home medication Protonix EC 40 mg daily  Polycythemia Vera JAK 2 negative  Patient is supposed to go for routine phlebotomy to keep HCT down, has not been going for a while. Hgb stable.    HFpEF [EF 55-60%] GDMT consists of Entresto 24-26 mg BID, Coreg 12.5 BID, and Jardiance 10 mg daily - Will continue Entresto 24-26 mg BID - Hold Coreg and Jardiance to optimize blood pressures    Hx of Prosthetic Joint infection with MSSA on chronic antibiotic suppression therapy  Follows ID clinic outpatient. Reports that he is not taking Augmentin. - Hold Cefadroxil 500 mg BID, on Zosyn     Lipoma  - Incidental finding noted on MRCP at right posterior paraspinal musculature measures.  - Will need OP follow up   Diet: Normal IVF: LR,100cc/hr VTE: SCDs Wounds:  Code: Full PT/OT recs: None TOC recs: none Family Update: wife at bedside    Dispo: Anticipated discharge to Home in 1 days pending labs and medical management.   Signature: Jeral Pinch, D.O.  Internal Medicine Resident, PGY-1 [facility]  Pager: [phone] 10:50 AM, 11/26/2023   Please contact the on call pager after 5 pm and on weekends at [phone].

## 2023-11-26 NOTE — Progress Notes (Addendum)
 Patient ID: William Sill., male   DOB: 1960-09-13, 63 y.o.   MRN: [id number]     Attending physician's note   I have taken a history, reviewed the chart, and examined the patient. I performed a substantive portion of this encounter, including complete performance of at least one of the key components, in conjunction with the APP. I agree with the APP's note, impression, and recommendations with my edits.   Doing well after ERCP yesterday.  Liver enzymes and bilirubin downtrending nicely.  Reviewed surgical notes with plan for eventual cholecystectomy, tentatively scheduled for 3/31.  Continuing IV Zosyn.  Inpatient GI service will sign off at this time.  Please do not hesitate to contact us with additional questions or concerns.  Alecia Doi, DO, FACG [phone] office          Progress Note   Subjective   Day # 4 CC; abdominal pain, fatigue, malaise poor appetite and new onset jaundice  IV Zosyn  Tmax 99.7  ERCP yesterday with successful placement of common bile duct stent above the level of the impacted cystic duct stone.  There was pus in the duct, which drained throughout the procedure. EGD was also done with no findings of esophageal varices, there were diffuse mucosal changes with erythema and petechia in the gastric fundus and body consistent with portal gastropathy versus gastritis-biopsies were done.  Labs today-WBC 6.4/hemoglobin 17.1/hematocrit 46.7/platelets 135 Potassium 3.9 /BUN 13/creatinine 1.26 T. bili 7.7/alk phos 126/AST 50/ALT 111 which improved  Patient and wife at bedside, patient says he slept well last night, in general feels better less abdominal pain.  He has been taking liquids without difficulty   Objective   Vital signs in last 24 hours: Temp:  [97.1 F (36.2 C)-99.7 F (37.6 C)] 99.7 F (37.6 C) (03/28 0700) Pulse Rate:  [61-97] 61 (03/28 0700) Resp:  [13-22] 17 (03/28 0700) BP: (97-128)/(72-91) 116/73 (03/28 0700) SpO2:  [93  %-99 %] 95 % (03/28 0700) Last BM Date : 11/25/23 General:    Older white male in NAD less jaundiced Heart:  Regular rate and rhythm; no murmurs Lungs: Respirations even and unlabored, lungs CTA bilaterally Abdomen:  Soft, obese, mildly tender in the epigastrium right upper quadrant, no rebound normal bowel sounds. Extremities:  Without edema. Neurologic:  Alert and oriented,  grossly normal neurologically. Psych:  Cooperative. Normal mood and affect.  Intake/Output from previous day: 03/27 0701 - 03/28 0700 In: 760 [P.O.:360; I.V.:400] Out: 300 [Urine:300] Intake/Output this shift: No intake/output data recorded.  Lab Results: Recent Labs    11/24/23 0608 11/25/23 0609 11/26/23 0555  WBC 5.0 4.8 6.4  HGB 17.0 17.9* 17.1*  HCT 47.9 49.3 46.7  PLT 147* 147* 135*   BMET Recent Labs    11/24/23 0608 11/25/23 0609 11/26/23 0555  NA 135 135 136  K 3.7 3.8 3.9  CL 103 103 101  CO2 20* 23 22  GLUCOSE 94 100* 132*  BUN 13 11 13   CREATININE 1.14 1.11 1.26*  CALCIUM 8.5* 8.6* 8.8*   LFT Recent Labs    11/26/23 0555  PROT 5.6*  ALBUMIN 2.7*  AST 50*  ALT 111*  ALKPHOS 126  BILITOT 7.7*   PT/INR Recent Labs    11/23/23 1041 11/24/23 0608  LABPROT 18.9* 16.3*  INR 1.6* 1.3*    Studies/Results: DG ERCP Result Date: 11/26/2023 CLINICAL DATA:  Cirrhosis, jaundice, cholelithiasis, possible choledocholithiasis EXAM: ERCP TECHNIQUE: Multiple spot images obtained with the fluoroscopic device and submitted for  interpretation post-procedure. COMPARISON:  CT 11/23/2023 FINDINGS: A series of fluoroscopic spot images document endoscopic cannulation and opacification of the biliary tree. Filling defect just distal to the confluence of the biliary ducts. Subsequent placement of plastic biliary stent. IMPRESSION: 1. ERCP with biliary stent placement. 2. These images were submitted for radiologic interpretation only. These images were submitted for radiologic interpretation only.  Please see the procedural report for the amount of contrast and the fluoroscopy time utilized. Electronically Signed   By: Corlis Leak M.D.   On: 11/26/2023 08:00       Assessment / Plan:    #61 63 year old white male admitted after onset of upper abdominal pain, nausea, poor appetite and malaise since Friday, 11/19/2023, with constant abdominal pain since.  Onset of jaundice and found to have T. bili of 10.6 on day of admission.  Workup with imaging including CT and MRI/MRCP revealed an impacted stone in the distal cystic duct with mass effect on the common bile duct, no choledocholithiasis, there is mild intrahepatic ductal dilation and enhancement of the wall of the cystic and common bile duct concerning for cholangitis.  There is also soft tissue enhancing within the gallbladder measuring 2.1 x 2.8 cm which appears inseparable from the adjacent liver cannot rule out underlying gallbladder neoplasm.  Cirrhotic morphology of the liver and mild splenomegaly.  Patient has been on IV Zosyn since 11/23/2023. Xarelto on hold  ERCP was done successfully yesterday with placement of a plastic common bile duct stent above the impacted cystic duct stone.  There was purulent material/pus flowing from the duct through the case.  Patient is stable postprocedure, feeling well and LFTs are trending down nicely.  #2 new diagnosis of cirrhosis-autoimmune and inheritable markers unrevealing.  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  #3 polycythemia vera #4.  Permanent atrial fibrillation #5.  Adult onset diabetes mellitus #6.  Sleep apnea #7.  Hypertension  Plan; continue to trend LFTs to normalization Continue IV Zosyn Current plan per surgery is for cholecystectomy early next week Patient will need follow-up with Dr. Leone Payor for eventual removement of the plastic stent in 10 to 12 weeks. We will also plan to follow him up outpatient for  cirrhosis management. GI will sign off, available if needed    Principal Problem:   Cystic duct calculus Active Problems:   Obstructive sleep apnea syndrome   Gastropathy   History of transient ischemic attack   Essential hypertension   Polycythemia vera (HCC)   Permanent atrial fibrillation (HCC)   Type 2 diabetes mellitus (HCC)   (HFpEF) heart failure with preserved ejection fraction (HCC)   Staphylococcal arthritis, right knee (HCC)   Liver cirrhosis (HCC)   Cholestasis   Cholangitis   Elevated bilirubin   Obstructive jaundice     LOS: 4 days   [name] EsterwoodPA-C  11/26/2023, 10:06 AM

## 2023-11-26 NOTE — Progress Notes (Signed)
 1 Day Post-Op  Subjective: LFTs are downtrending today (Tbili 7.7 from 12.8). Patient feels well. Afebrile.   Objective: Vital signs in last 24 hours: Temp:  [97.1 F (36.2 C)-99.7 F (37.6 C)] 99.7 F (37.6 C) (03/28 0700) Pulse Rate:  [61-97] 61 (03/28 0700) Resp:  [13-22] 17 (03/28 0700) BP: (97-128)/(72-91) 116/73 (03/28 0700) SpO2:  [93 %-99 %] 95 % (03/28 0700) Last BM Date : 11/25/23  Intake/Output from previous day: 03/27 0701 - 03/28 0700 In: 760 [P.O.:360; I.V.:400] Out: 300 [Urine:300] Intake/Output this shift: No intake/output data recorded.  PE: General: resting comfortably, NAD Neuro: alert and oriented, no focal deficits HEENT: scleral icterus Resp: normal work of breathing on room air Abdomen: soft, nondistended, nontender to palpation. Extremities: warm and well-perfused   Lab Results:  Recent Labs    11/25/23 0609 11/26/23 0555  WBC 4.8 6.4  HGB 17.9* 17.1*  HCT 49.3 46.7  PLT 147* 135*   BMET Recent Labs    11/25/23 0609 11/26/23 0555  NA 135 136  K 3.8 3.9  CL 103 101  CO2 23 22  GLUCOSE 100* 132*  BUN 11 13  CREATININE 1.11 1.26*  CALCIUM 8.6* 8.8*   PT/INR Recent Labs    11/23/23 1041 11/24/23 0608  LABPROT 18.9* 16.3*  INR 1.6* 1.3*   CMP     Component Value Date/Time   NA 136 11/26/2023 0555   NA 137 04/28/2023 1114   NA 138 05/22/2015 1303   K 3.9 11/26/2023 0555   K 4.1 05/22/2015 1303   CL 101 11/26/2023 0555   CO2 22 11/26/2023 0555   CO2 23 05/22/2015 1303   GLUCOSE 132 (H) 11/26/2023 0555   GLUCOSE 162 (H) 05/22/2015 1303   BUN 13 11/26/2023 0555   BUN 15 04/28/2023 1114   BUN 17.4 05/22/2015 1303   CREATININE 1.26 (H) 11/26/2023 0555   CREATININE 1.20 03/01/2023 0923   CREATININE 1.1 05/22/2015 1303   CALCIUM 8.8 (L) 11/26/2023 0555   CALCIUM 9.4 05/22/2015 1303   PROT 5.6 (L) 11/26/2023 0555   PROT 6.9 05/27/2020 0916   PROT 6.9 05/22/2015 1303   ALBUMIN 2.7 (L) 11/26/2023 0555   ALBUMIN  4.6 05/27/2020 0916   ALBUMIN 3.9 05/22/2015 1303   AST 50 (H) 11/26/2023 0555   AST 23 04/14/2022 1320   AST 24 05/22/2015 1303   ALT 111 (H) 11/26/2023 0555   ALT 21 04/14/2022 1320   ALT 30 05/22/2015 1303   ALKPHOS 126 11/26/2023 0555   ALKPHOS 73 05/22/2015 1303   BILITOT 7.7 (H) 11/26/2023 0555   BILITOT 1.2 04/14/2022 1320   BILITOT 0.77 05/22/2015 1303   GFRNONAA >60 11/26/2023 0555   GFRNONAA >60 04/14/2022 1320   GFRNONAA 66 11/24/2016 0946   GFRAA 74 05/27/2020 0916   GFRAA 76 11/24/2016 0946   Lipase     Component Value Date/Time   LIPASE 36 11/22/2023 1615       Studies/Results: DG ERCP Result Date: 11/26/2023 CLINICAL DATA:  Cirrhosis, jaundice, cholelithiasis, possible choledocholithiasis EXAM: ERCP TECHNIQUE: Multiple spot images obtained with the fluoroscopic device and submitted for interpretation post-procedure. COMPARISON:  CT 11/23/2023 FINDINGS: A series of fluoroscopic spot images document endoscopic cannulation and opacification of the biliary tree. Filling defect just distal to the confluence of the biliary ducts. Subsequent placement of plastic biliary stent. IMPRESSION: 1. ERCP with biliary stent placement. 2. These images were submitted for radiologic interpretation only. These images were submitted for radiologic interpretation only.  Please see the procedural report for the amount of contrast and the fluoroscopy time utilized. Electronically Signed   By: Corlis Leak M.D.   On: 11/26/2023 08:00    Anti-infectives: Anti-infectives (From admission, onward)    Start     Dose/Rate Route Frequency Ordered Stop   11/23/23 1800  piperacillin-tazobactam (ZOSYN) IVPB 3.375 g        3.375 g 12.5 mL/hr over 240 Minutes Intravenous Every 8 hours 11/23/23 1714     11/22/23 2200  cefadroxil (DURICEF) capsule 500 mg  Status:  Discontinued        500 mg Oral 2 times daily 11/22/23 1837 11/24/23 0857        Assessment/Plan 63 yo male admitted with abdominal  pain, found to have jaundice and cholelithiasis, with a stone lodged in the cystic duct. ERCP yesterday confirmed a stone in the distal cystic duct with compression of the common hepatic duct, consistent with Mirizzi syndrome. The stone was not able to be removed, however a stent was placed and bilirubin is downtrending significantly today. There was also pus in the common bile duct, indicating cholangitis.   The patient feels much better and jaundice is improving. He will need a cholecystectomy, with extraction of the stone in the cystic duct. He was noted to have cirrhotic changes of the liver on imaging, which is a new diagnosis. He has no history of heavy EtOH use, suspect his cirrhosis is secondary to hepatic steatosis. There are no obvious venous collaterals on imaging, no gastroesophageal varices present on endoscopy, and platelets are >100k with no splenomegaly. No signs of portal hypertension or coagulopathy. Assuming his bilirubin normalizes, this is compensated cirrhosis without signs of portal hypertension, so I do not feel he is at prohibitive risk for surgery. Will plan for a laparoscopic cholecystectomy. I reviewed the possibility of conversion to a laparotomy as his gallbladder appears very contracted, as well as the possibility of a common bile duct exploration. Gallbladder wall thickening was present on MRCP, which I suspect is secondary to chronic inflammation and not malignancy, but if there is concern for malignancy intra-op, will plan for a radical cholecystectomy. I reviewed these plans in details with the patient, including the risks of bleeding, infection, and common bile duct injury. There is also the possibility of performing only a partial cholecystectomy depending on intraoperative findings.  Patient has already eaten solid food this morning. He has been scheduled for surgery this coming Monday 3/31 at 7:30am. If LFTs continue to downtrend tomorrow morning, he may go home and come  back for surgery. He should continue holding Xarelto until surgery. If he is discharged, transition to oral Augmentin for treatment of cholangitis.   Will follow up with patient again tomorrow morning.    LOS: 4 days    Sophronia Simas, MD [facility] General, Hepatobiliary and Pancreatic Surgery 11/26/23 10:13 AM

## 2023-11-26 NOTE — Plan of Care (Signed)

## 2023-11-27 ENCOUNTER — Other Ambulatory Visit (HOSPITAL_COMMUNITY): Payer: Self-pay

## 2023-11-27 DIAGNOSIS — Z8619 Personal history of other infectious and parasitic diseases: Secondary | ICD-10-CM | POA: Diagnosis not present

## 2023-11-27 DIAGNOSIS — I5030 Unspecified diastolic (congestive) heart failure: Secondary | ICD-10-CM | POA: Diagnosis not present

## 2023-11-27 DIAGNOSIS — K8020 Calculus of gallbladder without cholecystitis without obstruction: Secondary | ICD-10-CM | POA: Diagnosis not present

## 2023-11-27 DIAGNOSIS — K7460 Unspecified cirrhosis of liver: Secondary | ICD-10-CM | POA: Diagnosis not present

## 2023-11-27 LAB — COMPREHENSIVE METABOLIC PANEL WITH GFR
ALT: 86 U/L — ABNORMAL HIGH (ref 0–44)
AST: 43 U/L — ABNORMAL HIGH (ref 15–41)
Albumin: 2.7 g/dL — ABNORMAL LOW (ref 3.5–5.0)
Alkaline Phosphatase: 105 U/L (ref 38–126)
Anion gap: 10 (ref 5–15)
BUN: 11 mg/dL (ref 8–23)
CO2: 24 mmol/L (ref 22–32)
Calcium: 8.6 mg/dL — ABNORMAL LOW (ref 8.9–10.3)
Chloride: 103 mmol/L (ref 98–111)
Creatinine, Ser: 1.16 mg/dL (ref 0.61–1.24)
GFR, Estimated: >60 mL/min (ref >60)
Glucose, Bld: 94 mg/dL (ref 70–99)
Potassium: 3.8 mmol/L (ref 3.5–5.1)
Sodium: 137 mmol/L (ref 135–145)
Total Bilirubin: 6.4 mg/dL — ABNORMAL HIGH (ref 0.0–1.2)
Total Protein: 5.7 g/dL — ABNORMAL LOW (ref 6.5–8.1)

## 2023-11-27 LAB — CBC
HCT: 48.1 % (ref 39.0–52.0)
Hemoglobin: 17.8 g/dL — ABNORMAL HIGH (ref 13.0–17.0)
MCH: 29.6 pg (ref 26.0–34.0)
MCHC: 37 g/dL — ABNORMAL HIGH (ref 30.0–36.0)
MCV: 79.9 fL — ABNORMAL LOW (ref 80.0–100.0)
Platelets: 139 10*3/uL — ABNORMAL LOW (ref 150–400)
RBC: 6.02 MIL/uL — ABNORMAL HIGH (ref 4.22–5.81)
RDW: 19.1 % — ABNORMAL HIGH (ref 11.5–15.5)
WBC: 5.1 10*3/uL (ref 4.0–10.5)
nRBC: 0 % (ref 0.0–0.2)

## 2023-11-27 MED ORDER — AMOXICILLIN-POT CLAVULANATE 875-125 MG PO TABS
1.0000 | ORAL_TABLET | Freq: Two times a day (BID) | ORAL | 0 refills | Status: DC
  Filled 2023-11-27: qty 4, 2d supply, fill #0

## 2023-11-27 NOTE — Progress Notes (Signed)
 [name] to be D/C'd Home per MD order.  Discussed with the patient and all questions fully answered.  VSS, Skin clean, dry and intact without evidence of skin break down, no evidence of skin tears noted. IV catheter discontinued intact. Site without signs and symptoms of complications. Dressing and pressure applied.  An After Visit Summary was printed and given to the patient. Patient received prescriptions from [facility] pharmacy.  D/c education completed with patient/family including follow up instructions, medication list, d/c activities limitations if indicated, with other d/c instructions as indicated by MD - patient able to verbalize understanding, all questions fully answered.   Patient instructed to return to ED, call 911, or call MD for any changes in condition.   Patient escorted via WC, and D/C home via private auto.  Pauletta Browns 11/27/2023 8:58 AM

## 2023-11-27 NOTE — Discharge Summary (Addendum)
 Name: [name]. MRN: [id number] DOB: [birth year] 63 y.o. PCP: Mercie Eon, MD  Date of Admission: 11/22/2023  5:03 PM Date of Discharge: 11/27/2023 09:50 PM  Attending Physician: Dr. Antony Contras  DISCHARGE DIAGNOSIS:  Primary Problem: Cystic duct calculus   Hospital Problems: Principal Problem:   Cystic duct calculus Active Problems:   Obstructive sleep apnea syndrome   Gastropathy   History of transient ischemic attack   Essential hypertension   Polycythemia vera (HCC)   Permanent atrial fibrillation (HCC)   Type 2 diabetes mellitus (HCC)   (HFpEF) heart failure with preserved ejection fraction (HCC)   Staphylococcal arthritis, right knee (HCC)   Liver cirrhosis (HCC)   Cholestasis   Cholangitis   Elevated bilirubin   Obstructive jaundice    DISCHARGE MEDICATIONS:   Allergies as of 11/27/2023       Reactions   Morphine And Codeine Other (See Comments)   Hallucinations        Medication List     PAUSE taking these medications    cefadroxil 500 MG capsule Wait to take this until your doctor or other care provider tells you to start again. Commonly known as: DURICEF Take 1 capsule (500 mg total) by mouth 2 (two) times daily.   Entresto 24-26 MG Wait to take this until your doctor or other care provider tells you to start again. Generic drug: sacubitril-valsartan Take 1 tablet by mouth 2 (two) times daily.   Jardiance 10 MG Tabs tablet Wait to take this until your doctor or other care provider tells you to start again. Generic drug: empagliflozin Take 1 tablet (10 mg total) by mouth daily.   Mounjaro 10 MG/0.5ML Pen Wait to take this until your doctor or other care provider tells you to start again. Generic drug: tirzepatide Inject 10 mg into the skin once a week.   Xarelto 20 MG Tabs tablet Wait to take this until your doctor or other care provider tells you to start again. Generic drug: rivaroxaban Take 1 tablet (20 mg total) by mouth daily  with supper.       TAKE these medications    acetaminophen 500 MG tablet Commonly known as: TYLENOL Take 500 mg by mouth every 6 (six) hours as needed.   amoxicillin-clavulanate 875-125 MG tablet Commonly known as: AUGMENTIN Take 1 tablet by mouth 2 (two) times daily for 2 days.   b complex vitamins capsule Take 1 capsule by mouth daily.   carvedilol 12.5 MG tablet Commonly known as: COREG Take 1 tablet (12.5 mg total) by mouth 2 (two) times daily.   methocarbamol 500 MG tablet Commonly known as: ROBAXIN Take 1 tablet (500 mg total) by mouth every 6 (six) hours as needed for muscle spasms.   multivitamin with minerals Tabs tablet Take 1 tablet by mouth daily.   Nasal Spray 0.05 % Soln Place into the nose.   pantoprazole 40 MG tablet Commonly known as: PROTONIX Take 1 tablet (40 mg total) by mouth daily.   rosuvastatin 10 MG tablet Commonly known as: CRESTOR Take 1 tablet (10 mg total) by mouth daily.   Taltz 80 MG/ML pen Generic drug: ixekizumab Inject 1 ml (80mg ) under the skin every 4 weeks.   Vitamin D (Cholecalciferol) 25 MCG (1000 UT) Caps Take 1,000 Units by mouth daily.        DISPOSITION AND FOLLOW-UP:  Mr.[name]. was discharged from [facility] in Good condition. At the hospital follow up visit please address:  Acute Cholecystitis/Acute cholangitis/jaundice: S/p ERCP with stent placement 3/27, discharged on Augmentin BID until surgery on Monday 11/29/2023 with Dr. Freida Busman for cholecystectomy. We held his Xarelto, Cefadroxil, Entresto and Jardiance until surgery on Monday.  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  Permanent Afib: Held Xarelto till surgery.  HFpEF: Held Entresto until surgery.  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   Follow-up  Recommendations: Consults: {None) Labs: CBC and Comprehensive Metabolic Panel Studies: none Medications: New meds are Augmentin BID until surgery. HELD -> Xarelto, Valla Leaver, Shepherd until surgery.   Follow-up Appointments:  Follow-up Information     Mercie Eon, MD Follow up.   Specialty: Internal Medicine Contact information: [address] [place], [address] [place] [address] [phone]                 HOSPITAL COURSE:  Patient Summary: [name]. is a 63 y.o. 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    MRCP with impacted cystic duct and compression of proximal CBD Acute cholangitis Acute cholecystitis  Jaundice  MRCP findings impacted stone within the distal cystic duct has mass effect upon the common bile duct.  Beyond this level the mid and distal common bile ducts are nondilated without signs of distal choledocholithiasis.  Enhancement of the wall of the cystic duct and common bile duct noted favoring cholangitis. Patient was started on IV Zosyn/ There is also a concern for neoplasm of the gallbladder in the imaging, however likely in the setting of inflammation. GI and general surgery on board, S/p ERCP with placement of stent in CBD with evidence of purulent drainage. Planned for surgery on Monday 3/31 at 7:30 AM for cholecystectomy. Labs improved after stent placement, patient is discharged with Augmentin for cholangitis.    ? Liver cirrhosis (newly diagnosed) likely 2/2 hepatic steatosis, likely compensated  Mild Splenomegaly  Patient has markedly elevated T bili > 10, splenomegaly, liver cirrhosis and mild intrahepatic bile duct dilatation noted in the MRCP. GI  ordered workup for newly diagnosed liver cirrhosis, anti-smooth muscle antibody negative, mitochondrial antibody negative, ANA negative, alpha-1 antitrypsin WNL, Ceruplasmin WNL. Hepatitis serologies negative. GI will plan to follow outpatient for the cirrhosis management  Permanent Afib - Rate controlled. Hold home medication Xarelto for procedure on Monday    T2DM Home medication Mounjaro 10 mg weekly and Jardiance 10 mg daily.  Hold Jardiance to optimize blood pressures    GERD - Continue home medication Protonix EC 40 mg daily    Polycythemia Vera JAK 2 negative  Patient is supposed to go for routine phlebotomy to keep HCT down, has not been going for a while. Hgb stable.    HFpEF [EF 55-60%] GDMT consists of Entresto 24-26 mg BID, Coreg 12.5 BID, and Jardiance 10 mg daily - Will continue Entresto 24-26 mg BID, Hold Coreg and Jardiance to optimize blood pressures in the hospital.    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 Reports that he is not taking Augmentin. Hold Cefadroxil 500 mg BID, on Zosyn here.      Lipoma  -  Incidental finding noted on MRCP at right posterior paraspinal musculature measures. Will need OP follow up    DISCHARGE INSTRUCTIONS:   Discharge Instructions     Diet - low sodium heart healthy   Complete by: As directed    Discharge instructions   Complete by: As directed    Mr. Rodrick, [place] came to the hospital for abdominal pain and belching. You had an ERCP done.   These are the changes to your medications: - HOLD Mounjaro until surgery  - HOLD Xarelto until surgery  - HOLD Cefadroxil until surgery  We added an antibiotic Augmentin, take one tablet by mouth 2 times daily.   Your Surgery is scheduled on November 29 2023 at 07:20 AM  No other changes to medications were made.   If you have any of these following symptoms, please call us or seek care at an emergency  department: -Chest Pain -Difficulty Breathing -Worsening abdominal pain -Syncope (passing out) -Drooping of face -Slurred speech -Sudden weakness in your leg or arm -Fever -Chills -blood in the stool -dark black, sticky stool  If you have any questions or concerns, call our clinic at [phone] or after hours call [phone] and ask for the internal medicine resident on call.  I am glad you are feeling better. It was a pleasure taking care for you. I wish a good recovery and good health!   Dr. Jeral Pinch   Increase activity slowly   Complete by: As directed        SUBJECTIVE:  Patient is evaluated at bedside, report doing well this morning. Denies any abdominal pain, nausea or vomiting. Reports that he had a bowel movement this morning. Reports that he spoke with surgeon this morning who thoroughly explained the procedure.  Discharge Vitals:   BP (!) 147/85 (BP Location: Left Arm)   Pulse 71   Temp 97.7 F (36.5 C) (Oral)   Resp 16   Ht 5\' 10"  (1.778 m)   Wt 112.3 kg   SpO2 95%   BMI 35.51 kg/m   OBJECTIVE:  Physical Exam   General: Laying in bed comfortably, no acute distress Eyes: Scleral icterus Cardiovascular: regular rate Pulm: breathing comfortably Abd: soft, NT, ND Skin: Jaundice MSK: no LE edema bilaterally  Pertinent Labs, Studies, and Procedures:     Latest Ref Rng & Units 11/27/2023    5:31 AM 11/26/2023    5:55 AM 11/25/2023    6:09 AM  CBC  WBC 4.0 - 10.5 K/uL 5.1  6.4  4.8   Hemoglobin 13.0 - 17.0 g/dL 29.5  62.1  30.8   Hematocrit 39.0 - 52.0 % 48.1  46.7  49.3   Platelets 150 - 400 K/uL 139  135  147        Latest Ref Rng & Units 11/27/2023    5:31 AM 11/26/2023    5:55 AM 11/25/2023    6:09 AM  CMP  Glucose 70 - 99 mg/dL 94  657  846   BUN 8 - 23 mg/dL 11  13  11    Creatinine 0.61 - 1.24 mg/dL 9.62  9.52  8.41   Sodium 135 - 145 mmol/L 137  136  135   Potassium 3.5 - 5.1 mmol/L 3.8  3.9  3.8   Chloride 98 - 111 mmol/L 103   101  103   CO2 22 - 32 mmol/L 24  22  23    Calcium 8.9 - 10.3 mg/dL 8.6  8.8  8.6   Total  Protein 6.5 - 8.1 g/dL 5.7  5.6  5.8   Total Bilirubin 0.0 - 1.2 mg/dL 6.4  7.7  40.9   Alkaline Phos 38 - 126 U/L 105  126  138   AST 15 - 41 U/L 43  50  83   ALT 0 - 44 U/L 86  111  155     CT LIVER ABDOMEN W WO CONTRAST Result Date: 11/23/2023 CLINICAL DATA:  Cirrhosis with jaundice. Eval for hepatic or extrahepatic lesions EXAM: CT ABDOMEN WITHOUT AND WITH CONTRAST TECHNIQUE: Multidetector CT imaging of the abdomen was performed following the standard protocol before and following the bolus administration of intravenous contrast. RADIATION DOSE REDUCTION: This exam was performed according to the departmental dose-optimization program which includes automated exposure control, adjustment of the mA and/or kV according to patient size and/or use of iterative reconstruction technique. CONTRAST:  OMNIPAQUE IOHEXOL 350 MG/ML SOLN COMPARISON:  Right upper quadrant abdominal ultrasound 04/09/2021. Abdominal MRI 11/22/2023. FINDINGS: Lower chest: Clear lung bases. No significant pleural or pericardial effusion. The heart is mildly enlarged. There is aortic and coronary artery atherosclerosis with evidence of previous ASD closure. Hepatobiliary: Morphologic changes of cirrhosis again noted with diffuse contour irregularity of the liver and enlargement of the caudate and left lobes. No arterial phase enhancing liver lesions are demonstrated. As on recent MRI, the gallbladder is contracted with mild wall thickening and probable surrounding inflammation. There are small calcified gallstones at the gallbladder fundus and within the gallbladder neck. Suspected partially calcified 5 mm calculus within the common hepatic duct (image 32/3). As seen on MRI, there is wall enhancement of the cystic and common hepatic ducts which are mildly dilated. There is also mild intrahepatic biliary dilatation. The common bile duct appears  decompressed. Pancreas: No evidence of pancreatic mass, pancreatic ductal dilatation or surrounding inflammation. Spleen: Normal in size without focal abnormality. Adrenals/Urinary Tract: Both adrenal glands appear normal. No evidence of urinary tract calculus or hydronephrosis. There are small renal cysts bilaterally for which no specific follow-up imaging is recommended. On the delayed post-contrast images, there is a fluid-fluid level within the left renal pelvis without definite associated mass lesion or abnormal enhancement, likely physiologic. There is no perinephric soft tissue stranding. Bladder not imaged. Stomach/Bowel: No enteric contrast administered. The stomach appears unremarkable for its degree of distension. No evidence of bowel wall thickening, distention or surrounding inflammatory change.The appendix appears normal. Mild distal colonic diverticulosis. Vascular/Lymphatic: There are no enlarged abdominal lymph nodes. Aortic and branch vessel atherosclerosis without evidence of aneurysm or large vessel occlusion. Other: No evidence of abdominal wall hernia, ascites or pneumoperitoneum. Musculoskeletal: No acute or significant osseous findings. Multilevel spondylosis. Again noted is a lipomatous mass within the right erector spinae muscles measuring 5.0 x 4.6 cm transverse and extending approximately 10.6 cm in length. This demonstrates no aggressive characteristics. IMPRESSION: 1. Morphologic changes of cirrhosis without evidence of hepatic neoplasm. 2. Cholelithiasis and suspected choledocholithiasis with mild gallbladder wall thickening and surrounding inflammation. There is also wall enhancement of the cystic and common hepatic ducts which are mildly dilated. Findings are suspicious for cholecystitis and cholangitis. Follow-up imaging or ERCP recommended to exclude biliary malignancy. 3. 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 4. Aortic atherosclerosis.  Electronically Signed   By: Carey Bullocks M.D.   On: 11/23/2023 15:58   MR ABDOMEN WITH MRCP W CONTRAST Result Date: 11/23/2023 CLINICAL DATA:  Evaluate for choledocholithiasis. EXAM: MRI ABDOMEN WITHOUT AND WITH CONTRAST (INCLUDING  MRCP) TECHNIQUE: Multiplanar multisequence MR imaging of the abdomen was performed both before and after the administration of intravenous contrast. Heavily T2-weighted images of the biliary and pancreatic ducts were obtained, and three-dimensional MRCP images were rendered by post processing. CONTRAST:  5mL GADAVIST GADOBUTROL 1 MMOL/ML IV SOLN COMPARISON:  None Available. FINDINGS: Lower chest: No acute findings. Hepatobiliary: Cirrhotic morphology of the liver is identified with marked enlargement of the caudate lobe. Contour the liver is nodular and there is widening of the falciform ligament. No enhancing liver lesions identified. The gallbladder appears decompressed around small stones. Enhancing soft tissue within the gallbladder measures approximately 2.1 x 2.8 cm, image 53/12 and image 79/1102. This appears inseparable from the adjacent liver. Mild intrahepatic bile duct dilatation. Enhancement of the wall of the cystic duct and common bile duct noted. Cystic duct is dilated measuring 1 cm in diameter. Impacted stone within the distal cystic duct has mass effect upon the common bile duct. The stone measures 0.9 x 0.5 cm, image 21/3. Beyond this level the mid and distal common bile duct are nondilated without signs of choledocholithiasis. Pancreas: The pancreas appears edematous. No main duct dilatation or mass identified. No significant peripancreatic fat stranding or free fluid. Spleen: The spleen measures 13.4 cm in cranial caudal dimension. No focal splenic abnormality. Adrenals/Urinary Tract: No suspicious adrenal mass. No hydronephrosis. Small hemorrhagic cyst noted within the upper pole of the left kidney measuring 5 mm, Bosniak class 2. Additional, less than 1 cm T2  hyperintense kidney lesions are noted compatible with Bosniak class 2 lesions. No follow-up imaging recommended. Stomach/Bowel: Stomach appears normal. No bowel wall thickening, inflammation or distension identified. Vascular/Lymphatic: Normal caliber abdominal aorta. No upper abdominal adenopathy. Portal vein is patent. Other:  No significant ascites or focal fluid collections. Musculoskeletal: Lipoma within the right posterior paraspinal musculature measures 5.7 x 4.1 by 59.6 cm. No enhancing bone lesions identified. IMPRESSION: 1. Impacted stone within the distal cystic duct has mass effect upon the common bile duct. The stone measures 0.9 x 0.5 cm. Beyond this level the mid and distal common bile duct are nondilated without signs of distal choledocholithiasis. 2. Mild intrahepatic bile duct dilatation. Enhancement of the wall of the cystic duct and common bile duct noted. Findings are favored to reflect cholangitis. 3. Enhancing soft tissue within the gallbladder measures approximately 2.1 x 2.8 cm. This appears inseparable from the adjacent liver. Cannot exclude underlying gallbladder neoplasm. 4. Cirrhotic morphology of the liver with marked enlargement of the caudate lobe. No enhancing liver lesions identified. 5. Mild splenomegaly. 6. Lipoma within the right posterior paraspinal musculature measures 5.7 x 4.1 x 59.6 cm. Electronically Signed   By: Signa Kell M.D.   On: 11/23/2023 06:02   MR 3D Recon At Scanner Result Date: 11/23/2023 CLINICAL DATA:  Evaluate for choledocholithiasis. EXAM: MRI ABDOMEN WITHOUT AND WITH CONTRAST (INCLUDING MRCP) TECHNIQUE: Multiplanar multisequence MR imaging of the abdomen was performed both before and after the administration of intravenous contrast. Heavily T2-weighted images of the biliary and pancreatic ducts were obtained, and three-dimensional MRCP images were rendered by post processing. CONTRAST:  5mL GADAVIST GADOBUTROL 1 MMOL/ML IV SOLN COMPARISON:  None  Available. FINDINGS: Lower chest: No acute findings. Hepatobiliary: Cirrhotic morphology of the liver is identified with marked enlargement of the caudate lobe. Contour the liver is nodular and there is widening of the falciform ligament. No enhancing liver lesions identified. The gallbladder appears decompressed around small stones. Enhancing soft tissue within the gallbladder measures approximately 2.1  x 2.8 cm, image 53/12 and image 79/1102. This appears inseparable from the adjacent liver. Mild intrahepatic bile duct dilatation. Enhancement of the wall of the cystic duct and common bile duct noted. Cystic duct is dilated measuring 1 cm in diameter. Impacted stone within the distal cystic duct has mass effect upon the common bile duct. The stone measures 0.9 x 0.5 cm, image 21/3. Beyond this level the mid and distal common bile duct are nondilated without signs of choledocholithiasis. Pancreas: The pancreas appears edematous. No main duct dilatation or mass identified. No significant peripancreatic fat stranding or free fluid. Spleen: The spleen measures 13.4 cm in cranial caudal dimension. No focal splenic abnormality. Adrenals/Urinary Tract: No suspicious adrenal mass. No hydronephrosis. Small hemorrhagic cyst noted within the upper pole of the left kidney measuring 5 mm, Bosniak class 2. Additional, less than 1 cm T2 hyperintense kidney lesions are noted compatible with Bosniak class 2 lesions. No follow-up imaging recommended. Stomach/Bowel: Stomach appears normal. No bowel wall thickening, inflammation or distension identified. Vascular/Lymphatic: Normal caliber abdominal aorta. No upper abdominal adenopathy. Portal vein is patent. Other:  No significant ascites or focal fluid collections. Musculoskeletal: Lipoma within the right posterior paraspinal musculature measures 5.7 x 4.1 by 59.6 cm. No enhancing bone lesions identified. IMPRESSION: 1. Impacted stone within the distal cystic duct has mass effect  upon the common bile duct. The stone measures 0.9 x 0.5 cm. Beyond this level the mid and distal common bile duct are nondilated without signs of distal choledocholithiasis. 2. Mild intrahepatic bile duct dilatation. Enhancement of the wall of the cystic duct and common bile duct noted. Findings are favored to reflect cholangitis. 3. Enhancing soft tissue within the gallbladder measures approximately 2.1 x 2.8 cm. This appears inseparable from the adjacent liver. Cannot exclude underlying gallbladder neoplasm. 4. Cirrhotic morphology of the liver with marked enlargement of the caudate lobe. No enhancing liver lesions identified. 5. Mild splenomegaly. 6. Lipoma within the right posterior paraspinal musculature measures 5.7 x 4.1 x 59.6 cm. Electronically Signed   By: Signa Kell M.D.   On: 11/23/2023 06:02     Signed: Jeral Pinch, D.O.  Internal Medicine Resident, PGY-1 [facility]  Pager: [phone] 9:54 AM, 11/27/2023

## 2023-11-27 NOTE — H&P (View-Only) (Signed)
 General Surgery  LFTs continue to downtrend. Patient remains afebrile, and feels well this morning with only very minimal intermittent pain. WBC remains normal. Patient is scheduled for surgery this coming Monday morning at 0730. He strongly prefers to go home in the meantime. As LFTs continue to improve, and he remains afebrile with normal WBC, I feel it is safe for him to be discharged and return for surgery on Monday. He should be transitioned to oral antibiotics until surgery with gram negative coverage (such as Augmentin). We reviewed preoperative instructions; he should continue holding Xarelto and Mounjaro until surgery. He was instructed to return if he develops fevers or worsening pain the meantime. All questions were answered.  Sophronia Simas, MD [facility] Surgery General, Hepatobiliary and Pancreatic Surgery 11/27/23 7:31 AM

## 2023-11-27 NOTE — Hospital Course (Addendum)
 [name]. is a 63 y.o. 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    MRCP with impacted cystic duct and compression of proximal CBD Acute cholangitis Acute cholecystitis  Jaundice  MRCP findings impacted stone within the distal cystic duct has mass effect upon the common bile duct.  Beyond this level the mid and distal common bile ducts are nondilated without signs of distal choledocholithiasis.  Enhancement of the wall of the cystic duct and common bile duct noted favoring cholangitis. There is also a concern for neoplasm of the gallbladder in the imaging, however likely in the setting of inflammation. GI and general surgery on board, S/p ERCP with placement of stent in CBD with evidence of purulent drainage. Planned for surgery on Monday 3/31 at 7:30 AM for cholecystectomy.    If liver function panel continue to downtrend tomorrow morning, he can go home and come back for surgery. Will re-evaluate tomorrow.    - Continue to hold Xarelto   - Advance diet as patient tolerates  - PRN Robaxin 500 mg q6h  - Tylenol 500 mg every 6 hours - PRN Dilaudid 0.5 mg every 4 hours for severe pain  - PRN Zofran q6h for nausea  - IV Zosyn for now, but if discharged, can transition to PO Augmentin    Liver cirrhosis (newly diagnosed) likely 2/2 hepatic steatosis  Mild Splenomegaly  Patient has markedly elevated T bili > 10, splenomegaly, liver cirrhosis and mild intrahepatic bile duct dilatation noted in the MRCP. GI ordered workup for newly diagnosed liver cirrhosis, anti-smooth muscle antibody negative, mitochondrial antibody negative, ANA negative, alpha-1 antitrypsin WNL, Ceruplasmin WNL. Hepatitis serologies  negative. GI will plan to follow outpatient for the cirrhosis management - OP follow up with GI   Permanent Afib - Rate controlled - Hold home medication Xarelto for procedure on Monday    T2DM Home medication Mounjaro 10 mg weekly and Jardiance 10 mg daily.  - Hold Jardiance to optimize blood pressures    GERD - Continue home medication Protonix EC 40 mg daily    Polycythemia Vera JAK 2 negative  Patient is supposed to go for routine phlebotomy to keep HCT down, has not been going for a while. Hgb stable.    HFpEF [EF 55-60%] GDMT consists of Entresto 24-26 mg BID, Coreg 12.5 BID, and Jardiance 10 mg daily - Will continue Entresto 24-26 mg BID - Hold Coreg and Jardiance to optimize blood pressures    Hx of Prosthetic Joint infection with MSSA on chronic antibiotic suppression therapy  Follows ID clinic outpatient. Reports that he is not taking Augmentin. - Hold Cefadroxil 500 mg BID, on Zosyn     Lipoma  - Incidental finding noted on MRCP at right posterior paraspinal musculature measures.  - Will need OP follow up   =========================================================  Mr. William, Delgado came to the hospital for abdominal pain and belching. You had an ERCP done.   These are the changes to your medications: - HOLD Mounjaro until surgery  - HOLD Xarelto until surgery  - HOLD Cefadroxil until surgery - HOLD Entresto and Jardiance until surgery   We added an antibiotic Augmentin, take one tablet by mouth 2 times daily.   Your Surgery is scheduled on November 29 2023 at  07:20 AM  No other changes to medications were made.   If you have any of these following symptoms, please call us or seek care at an emergency department: -Chest Pain -Difficulty Breathing -Worsening abdominal pain -Syncope (passing out) -Drooping of face -Slurred speech -Sudden weakness in your leg or arm -Fever -Chills -blood in the stool -dark black, sticky stool  If you have any  questions or concerns, call our clinic at [phone] or after hours call [phone] and ask for the internal medicine resident on call.  I am glad you are feeling better. It was a pleasure taking care for you. I wish a good recovery and good health!   Dr. Jeral Pinch   ===================================================  Acute Cholecystitis/Acute cholangitis/jaundice: S/p ERCP with stent placement 3/27, discharged on Augmentin BID until surgery on Monday 11/29/2023 with Dr. Freida Busman for cholecystectomy. We held his Xarelto, Cefadroxil, Entresto and Jardiance until surgery on Monday.  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  Permanent Afib: Held Xarelto till surgery.  HFpEF: Held Entresto until surgery.  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

## 2023-11-27 NOTE — Progress Notes (Signed)
 General Surgery  LFTs continue to downtrend. Patient remains afebrile, and feels well this morning with only very minimal intermittent pain. WBC remains normal. Patient is scheduled for surgery this coming Monday morning at 0730. He strongly prefers to go home in the meantime. As LFTs continue to improve, and he remains afebrile with normal WBC, I feel it is safe for him to be discharged and return for surgery on Monday. He should be transitioned to oral antibiotics until surgery with gram negative coverage (such as Augmentin). We reviewed preoperative instructions; he should continue holding Xarelto and Mounjaro until surgery. He was instructed to return if he develops fevers or worsening pain the meantime. All questions were answered.  Sophronia Simas, MD [facility] Surgery General, Hepatobiliary and Pancreatic Surgery 11/27/23 7:31 AM

## 2023-11-27 NOTE — Discharge Instructions (Addendum)
  Mr. [name], [name] came to the hospital for abdominal pain and belching. You had an ERCP done.   These are the changes to your medications: - HOLD Mounjaro until surgery  - HOLD Xarelto until surgery  - HOLD Cefadroxil until surgery - HOLD Entresto and Jardiance until surgery   We added an antibiotic Augmentin, take one tablet by mouth 2 times daily.   Your Surgery is scheduled on November 29 2023 at 07:20 AM  No other changes to medications were made.   If you have any of these following symptoms, please call us or seek care at an emergency department: -Chest Pain -Difficulty Breathing -Worsening abdominal pain -Syncope (passing out) -Drooping of face -Slurred speech -Sudden weakness in your leg or arm -Fever -Chills -blood in the stool -dark black, sticky stool  If you have any questions or concerns, call our clinic at [phone] or after hours call [phone] and ask for the internal medicine resident on call.  I am glad you are feeling better. It was a pleasure taking care for you. I wish a good recovery and good health!   Dr. Jeral Pinch

## 2023-11-29 ENCOUNTER — Encounter (HOSPITAL_COMMUNITY): Payer: Self-pay | Admitting: Surgery

## 2023-11-29 ENCOUNTER — Observation Stay (HOSPITAL_COMMUNITY)
Admission: RE | Admit: 2023-11-29 | Discharge: 2023-11-30 | Disposition: A | Discharge status: Home / Self Care | Source: Ambulatory Visit | Attending: Surgery | Admitting: Surgery

## 2023-11-29 ENCOUNTER — Inpatient Hospital Stay (HOSPITAL_BASED_OUTPATIENT_CLINIC_OR_DEPARTMENT_OTHER)

## 2023-11-29 ENCOUNTER — Encounter (HOSPITAL_COMMUNITY): Admission: RE | Discharge status: Home / Self Care | Payer: Self-pay | Source: Ambulatory Visit | Attending: Surgery

## 2023-11-29 ENCOUNTER — Inpatient Hospital Stay (HOSPITAL_COMMUNITY)

## 2023-11-29 ENCOUNTER — Other Ambulatory Visit: Payer: Self-pay

## 2023-11-29 DIAGNOSIS — I110 Hypertensive heart disease with heart failure: Secondary | ICD-10-CM

## 2023-11-29 DIAGNOSIS — G4733 Obstructive sleep apnea (adult) (pediatric): Secondary | ICD-10-CM

## 2023-11-29 DIAGNOSIS — Z4682 Encounter for fitting and adjustment of non-vascular catheter: Secondary | ICD-10-CM | POA: Diagnosis not present

## 2023-11-29 DIAGNOSIS — K7581 Nonalcoholic steatohepatitis (NASH): Secondary | ICD-10-CM | POA: Diagnosis not present

## 2023-11-29 DIAGNOSIS — K811 Chronic cholecystitis: Secondary | ICD-10-CM | POA: Diagnosis present

## 2023-11-29 DIAGNOSIS — K8010 Calculus of gallbladder with chronic cholecystitis without obstruction: Secondary | ICD-10-CM

## 2023-11-29 DIAGNOSIS — K819 Cholecystitis, unspecified: Principal | ICD-10-CM | POA: Diagnosis present

## 2023-11-29 DIAGNOSIS — I5030 Unspecified diastolic (congestive) heart failure: Secondary | ICD-10-CM

## 2023-11-29 DIAGNOSIS — M7918 Myalgia, other site: Secondary | ICD-10-CM

## 2023-11-29 DIAGNOSIS — I4811 Longstanding persistent atrial fibrillation: Secondary | ICD-10-CM | POA: Diagnosis not present

## 2023-11-29 DIAGNOSIS — K8070 Calculus of gallbladder and bile duct without cholecystitis without obstruction: Secondary | ICD-10-CM | POA: Diagnosis not present

## 2023-11-29 HISTORY — DX: Cholecystitis, unspecified: K81.9

## 2023-11-29 HISTORY — PX: CHOLECYSTECTOMY: SHX55

## 2023-11-29 LAB — GLUCOSE, CAPILLARY
Glucose-Capillary: 112 mg/dL — ABNORMAL HIGH (ref 70–99)
Glucose-Capillary: 159 mg/dL — ABNORMAL HIGH (ref 70–99)
Glucose-Capillary: 145 mg/dL — ABNORMAL HIGH (ref 70–99)
Glucose-Capillary: 172 mg/dL — ABNORMAL HIGH (ref 70–99)
Glucose-Capillary: 145 mg/dL — ABNORMAL HIGH (ref 70–99)

## 2023-11-29 LAB — TYPE AND SCREEN
ABO/RH(D): O POS
Antibody Screen: NEGATIVE

## 2023-11-29 SURGERY — LAPAROSCOPIC CHOLECYSTECTOMY WITH INTRAOPERATIVE CHOLANGIOGRAM
Anesthesia: General | Site: Abdomen

## 2023-11-29 MED ORDER — OXYCODONE HCL 5 MG PO TABS
5.0000 mg | ORAL_TABLET | ORAL | Status: DC | PRN
  Filled 2023-11-29: qty 1

## 2023-11-29 MED ORDER — OXYCODONE HCL 5 MG PO TABS: 5.0000 mg | ORAL_TABLET | Freq: Once | ORAL | Status: DC | PRN

## 2023-11-29 MED ORDER — MIDAZOLAM HCL 2 MG/2ML IJ SOLN
INTRAMUSCULAR | Status: AC
  Filled 2023-11-29: qty 2

## 2023-11-29 MED ORDER — METRONIDAZOLE 500 MG/100ML IV SOLN
500.0000 mg | INTRAVENOUS | Status: AC
  Administered 2023-11-29: 500 mg via INTRAVENOUS
  Filled 2023-11-29: qty 100

## 2023-11-29 MED ORDER — ADULT MULTIVITAMIN W/MINERALS CH: 1.0000 | ORAL_TABLET | Freq: Every day | ORAL | Status: DC

## 2023-11-29 MED ORDER — ALBUMIN HUMAN 5 % IV SOLN
INTRAVENOUS | Status: DC | PRN
Start: 2023-11-29 — End: 2023-11-29

## 2023-11-29 MED ORDER — FENTANYL CITRATE (PF) 250 MCG/5ML IJ SOLN
INTRAMUSCULAR | Status: DC | PRN
Start: 2023-11-29 — End: 2023-11-29
  Administered 2023-11-29 (×2): 100 ug via INTRAVENOUS
  Administered 2023-11-29: 50 ug via INTRAVENOUS

## 2023-11-29 MED ORDER — HYDROMORPHONE HCL 1 MG/ML IJ SOLN
0.5000 mg | INTRAMUSCULAR | Status: DC | PRN
  Administered 2023-11-29: 0.5 mg via INTRAVENOUS

## 2023-11-29 MED ORDER — CEFAZOLIN SODIUM-DEXTROSE 2-4 GM/100ML-% IV SOLN
2.0000 g | INTRAVENOUS | Status: AC
  Administered 2023-11-29: 2 g via INTRAVENOUS
  Filled 2023-11-29: qty 100

## 2023-11-29 MED ORDER — CARVEDILOL 12.5 MG PO TABS
12.5000 mg | ORAL_TABLET | Freq: Two times a day (BID) | ORAL | Status: DC
  Administered 2023-11-29: 12.5 mg via ORAL
  Filled 2023-11-29: qty 1

## 2023-11-29 MED ORDER — ONDANSETRON HCL 4 MG/2ML IJ SOLN: 4.0000 mg | Freq: Four times a day (QID) | INTRAMUSCULAR | Status: DC | PRN

## 2023-11-29 MED ORDER — CHLORHEXIDINE GLUCONATE 0.12 % MT SOLN
15.0000 mL | Freq: Once | OROMUCOSAL | Status: AC
  Administered 2023-11-29: 15 mL via OROMUCOSAL
  Filled 2023-11-29: qty 15

## 2023-11-29 MED ORDER — ORAL CARE MOUTH RINSE: 15.0000 mL | Freq: Once | OROMUCOSAL | Status: AC

## 2023-11-29 MED ORDER — SODIUM CHLORIDE 0.9 % IV SOLN
INTRAVENOUS | Status: DC | PRN
  Administered 2023-11-29: 60 mL

## 2023-11-29 MED ORDER — FENTANYL CITRATE (PF) 250 MCG/5ML IJ SOLN
INTRAMUSCULAR | Status: AC
  Filled 2023-11-29: qty 5

## 2023-11-29 MED ORDER — PROPOFOL 10 MG/ML IV BOLUS
INTRAVENOUS | Status: DC | PRN
  Administered 2023-11-29: 150 mg via INTRAVENOUS

## 2023-11-29 MED ORDER — HYDROMORPHONE HCL 1 MG/ML IJ SOLN: 0.5000 mg | INTRAMUSCULAR | Status: DC | PRN

## 2023-11-29 MED ORDER — OXYCODONE HCL 5 MG/5ML PO SOLN: 5.0000 mg | Freq: Once | ORAL | Status: DC | PRN

## 2023-11-29 MED ORDER — ROCURONIUM BROMIDE 10 MG/ML (PF) SYRINGE
PREFILLED_SYRINGE | INTRAVENOUS | Status: DC | PRN
  Administered 2023-11-29: 20 mg via INTRAVENOUS
  Administered 2023-11-29: 30 mg via INTRAVENOUS
  Administered 2023-11-29: 70 mg via INTRAVENOUS

## 2023-11-29 MED ORDER — INSULIN ASPART 100 UNIT/ML IJ SOLN: 0.0000 [IU] | INTRAMUSCULAR | Status: DC | PRN

## 2023-11-29 MED ORDER — PHENYLEPHRINE 80 MCG/ML (10ML) SYRINGE FOR IV PUSH (FOR BLOOD PRESSURE SUPPORT)
PREFILLED_SYRINGE | INTRAVENOUS | Status: DC | PRN
  Administered 2023-11-29 (×2): 80 ug via INTRAVENOUS

## 2023-11-29 MED ORDER — LIDOCAINE 2% (20 MG/ML) 5 ML SYRINGE
INTRAMUSCULAR | Status: DC | PRN
  Administered 2023-11-29: 100 mg via INTRAVENOUS

## 2023-11-29 MED ORDER — DOCUSATE SODIUM 100 MG PO CAPS
100.0000 mg | ORAL_CAPSULE | Freq: Two times a day (BID) | ORAL | Status: DC
  Administered 2023-11-29: 100 mg via ORAL
  Filled 2023-11-29 (×2): qty 1

## 2023-11-29 MED ORDER — LACTATED RINGERS IV SOLN: INTRAVENOUS | Status: DC

## 2023-11-29 MED ORDER — PROPOFOL 10 MG/ML IV BOLUS
INTRAVENOUS | Status: AC
  Filled 2023-11-29: qty 20

## 2023-11-29 MED ORDER — DIPHENHYDRAMINE HCL 50 MG/ML IJ SOLN: 12.5000 mg | Freq: Four times a day (QID) | INTRAMUSCULAR | Status: DC | PRN

## 2023-11-29 MED ORDER — INSULIN ASPART 100 UNIT/ML IJ SOLN: 0.0000 [IU] | Freq: Three times a day (TID) | INTRAMUSCULAR | Status: DC

## 2023-11-29 MED ORDER — ONDANSETRON HCL 4 MG/2ML IJ SOLN
INTRAMUSCULAR | Status: DC | PRN
Start: 2023-11-29 — End: 2023-11-29
  Administered 2023-11-29: 4 mg via INTRAVENOUS

## 2023-11-29 MED ORDER — 0.9 % SODIUM CHLORIDE (POUR BTL) OPTIME
TOPICAL | Status: DC | PRN
  Administered 2023-11-29: 1000 mL

## 2023-11-29 MED ORDER — DIPHENHYDRAMINE HCL 12.5 MG/5ML PO ELIX: 12.5000 mg | ORAL_SOLUTION | Freq: Four times a day (QID) | ORAL | Status: DC | PRN

## 2023-11-29 MED ORDER — MIDAZOLAM HCL 2 MG/2ML IJ SOLN
INTRAMUSCULAR | Status: DC | PRN
  Administered 2023-11-29: 2 mg via INTRAVENOUS

## 2023-11-29 MED ORDER — BUPIVACAINE-EPINEPHRINE (PF) 0.25% -1:200000 IJ SOLN
INTRAMUSCULAR | Status: AC
  Filled 2023-11-29: qty 30

## 2023-11-29 MED ORDER — ONDANSETRON HCL 4 MG/2ML IJ SOLN: 4.0000 mg | Freq: Once | INTRAMUSCULAR | Status: DC | PRN

## 2023-11-29 MED ORDER — POLYETHYLENE GLYCOL 3350 17 G PO PACK: 17.0000 g | PACK | Freq: Every day | ORAL | Status: DC | PRN

## 2023-11-29 MED ORDER — SUGAMMADEX SODIUM 200 MG/2ML IV SOLN
INTRAVENOUS | Status: DC | PRN
  Administered 2023-11-29: 200 mg via INTRAVENOUS

## 2023-11-29 MED ORDER — SIMETHICONE 80 MG PO CHEW: 40.0000 mg | CHEWABLE_TABLET | Freq: Four times a day (QID) | ORAL | Status: DC | PRN

## 2023-11-29 MED ORDER — ACETAMINOPHEN 500 MG PO TABS
500.0000 mg | ORAL_TABLET | Freq: Three times a day (TID) | ORAL | Status: DC
  Administered 2023-11-29 (×2): 500 mg via ORAL
  Filled 2023-11-29 (×2): qty 1

## 2023-11-29 MED ORDER — ONDANSETRON 4 MG PO TBDP: 4.0000 mg | ORAL_TABLET | Freq: Four times a day (QID) | ORAL | Status: DC | PRN

## 2023-11-29 MED ORDER — BUPIVACAINE-EPINEPHRINE 0.25% -1:200000 IJ SOLN
INTRAMUSCULAR | Status: DC | PRN
  Administered 2023-11-29: 30 mL

## 2023-11-29 MED ORDER — ENOXAPARIN SODIUM 40 MG/0.4ML IJ SOSY
40.0000 mg | PREFILLED_SYRINGE | INTRAMUSCULAR | Status: DC
Start: 2023-11-30 — End: 2023-11-30

## 2023-11-29 MED ORDER — FENTANYL CITRATE (PF) 100 MCG/2ML IJ SOLN: 25.0000 ug | INTRAMUSCULAR | Status: DC | PRN

## 2023-11-29 MED ORDER — PANTOPRAZOLE SODIUM 40 MG PO TBEC
40.0000 mg | DELAYED_RELEASE_TABLET | Freq: Every day | ORAL | Status: DC
  Administered 2023-11-29: 40 mg via ORAL
  Filled 2023-11-29: qty 1

## 2023-11-29 MED ORDER — SODIUM CHLORIDE 0.9 % IR SOLN
Status: DC | PRN
  Administered 2023-11-29: 1000 mL

## 2023-11-29 MED ORDER — METHOCARBAMOL 500 MG PO TABS
500.0000 mg | ORAL_TABLET | Freq: Three times a day (TID) | ORAL | Status: DC
  Administered 2023-11-29 (×3): 500 mg via ORAL
  Filled 2023-11-29 (×3): qty 1

## 2023-11-29 MED ORDER — HYDROMORPHONE HCL 1 MG/ML IJ SOLN
INTRAMUSCULAR | Status: AC
  Administered 2023-11-29: 0.5 mg via INTRAVENOUS
  Filled 2023-11-29: qty 1

## 2023-11-29 MED ORDER — ROSUVASTATIN CALCIUM 5 MG PO TABS
10.0000 mg | ORAL_TABLET | Freq: Every day | ORAL | Status: DC
  Administered 2023-11-29: 10 mg via ORAL
  Filled 2023-11-29 (×2): qty 2

## 2023-11-29 MED ORDER — DEXAMETHASONE SODIUM PHOSPHATE 10 MG/ML IJ SOLN
INTRAMUSCULAR | Status: DC | PRN
Start: 2023-11-29 — End: 2023-11-29
  Administered 2023-11-29: 4 mg via INTRAVENOUS

## 2023-11-29 MED ORDER — SPY AGENT GREEN - (INDOCYANINE FOR INJECTION)
1.2500 mg | Freq: Once | INTRAMUSCULAR | Status: AC
  Administered 2023-11-29: 1.25 mg via INTRAVENOUS
  Filled 2023-11-29: qty 10

## 2023-11-29 MED ORDER — AMOXICILLIN-POT CLAVULANATE 875-125 MG PO TABS
1.0000 | ORAL_TABLET | Freq: Two times a day (BID) | ORAL | Status: DC
  Administered 2023-11-29: 1 via ORAL
  Filled 2023-11-29: qty 1

## 2023-11-29 MED ORDER — CARVEDILOL 12.5 MG PO TABS
12.5000 mg | ORAL_TABLET | Freq: Once | ORAL | Status: AC
  Administered 2023-11-29: 12.5 mg via ORAL
  Filled 2023-11-29: qty 1

## 2023-11-29 SURGICAL SUPPLY — 51 items
APPLIER CLIP 5 13 M/L LIGAMAX5 (MISCELLANEOUS) ×1 IMPLANT
BAG COUNTER SPONGE SURGICOUNT (BAG) ×1 IMPLANT
BIOPATCH RED 1 DISK 7.0 (GAUZE/BANDAGES/DRESSINGS) IMPLANT
BLADE CLIPPER SURG (BLADE) IMPLANT
CANISTER SUCT 3000ML PPV (MISCELLANEOUS) ×1 IMPLANT
CATH EMB 3FR 40 (CATHETERS) IMPLANT
CATH EMB 4FR 40 (CATHETERS) IMPLANT
CHLORAPREP W/TINT 26 (MISCELLANEOUS) ×1 IMPLANT
CLIP APPLIE 5 13 M/L LIGAMAX5 (MISCELLANEOUS) ×1 IMPLANT
COVER MAYO STAND STRL (DRAPES) ×1 IMPLANT
COVER SURGICAL LIGHT HANDLE (MISCELLANEOUS) ×1 IMPLANT
DERMABOND ADVANCED .7 DNX12 (GAUZE/BANDAGES/DRESSINGS) ×1 IMPLANT
DRAIN CHANNEL 19F RND (DRAIN) IMPLANT
DRAPE C-ARM 42X120 X-RAY (DRAPES) ×1 IMPLANT
DRSG TEGADERM 4X4.75 (GAUZE/BANDAGES/DRESSINGS) IMPLANT
ELECT REM PT RETURN 9FT ADLT (ELECTROSURGICAL) ×1 IMPLANT
ELECTRODE REM PT RTRN 9FT ADLT (ELECTROSURGICAL) ×1 IMPLANT
ENDOLOOP SUT PDS II 0 18 (SUTURE) IMPLANT
EVACUATOR SILICONE 100CC (DRAIN) IMPLANT
GAUZE SPONGE 2X2 STRL 8-PLY (GAUZE/BANDAGES/DRESSINGS) IMPLANT
GLOVE BIOGEL PI IND STRL 6 (GLOVE) ×1 IMPLANT
GLOVE BIOGEL PI MICRO STRL 5.5 (GLOVE) ×1 IMPLANT
GOWN STRL REUS W/ TWL LRG LVL3 (GOWN DISPOSABLE) ×3 IMPLANT
IRRIG SUCT STRYKERFLOW 2 WTIP (MISCELLANEOUS) ×1 IMPLANT
IRRIGATION SUCT STRKRFLW 2 WTP (MISCELLANEOUS) ×1 IMPLANT
KIT BASIN OR (CUSTOM PROCEDURE TRAY) ×1 IMPLANT
KIT TURNOVER KIT B (KITS) ×1 IMPLANT
L-HOOK LAP DISP 36CM (ELECTROSURGICAL) ×1 IMPLANT
LHOOK LAP DISP 36CM (ELECTROSURGICAL) ×1 IMPLANT
NS IRRIG 1000ML POUR BTL (IV SOLUTION) ×1 IMPLANT
PAD ARMBOARD POSITIONER FOAM (MISCELLANEOUS) ×1 IMPLANT
PENCIL BUTTON HOLSTER BLD 10FT (ELECTRODE) ×1 IMPLANT
SCISSORS LAP 5X35 DISP (ENDOMECHANICALS) ×1 IMPLANT
SET CHOLANGIOGRAPH 5 50 .035 (SET/KITS/TRAYS/PACK) IMPLANT
SET TUBE SMOKE EVAC HIGH FLOW (TUBING) ×1 IMPLANT
SLEEVE Z-THREAD 5X100MM (TROCAR) ×2 IMPLANT
SPECIMEN JAR SMALL (MISCELLANEOUS) ×1 IMPLANT
SUT ETHILON 2 0 FS 18 (SUTURE) IMPLANT
SUT MNCRL AB 4-0 PS2 18 (SUTURE) ×1 IMPLANT
SUT VICRYL 0 UR6 27IN ABS (SUTURE) ×1 IMPLANT
SYR 3ML LL SCALE MARK (SYRINGE) IMPLANT
SYS BAG RETRIEVAL 10MM (BASKET) ×1 IMPLANT
SYSTEM BAG RETRIEVAL 10MM (BASKET) IMPLANT
TOWEL GREEN STERILE (TOWEL DISPOSABLE) ×1 IMPLANT
TOWEL GREEN STERILE FF (TOWEL DISPOSABLE) ×1 IMPLANT
TRAY LAPAROSCOPIC MC (CUSTOM PROCEDURE TRAY) ×1 IMPLANT
TROCAR BALLN 12MMX100 BLUNT (TROCAR) ×1 IMPLANT
TROCAR Z THREAD OPTICAL 12X100 (TROCAR) IMPLANT
TROCAR Z-THREAD OPTICAL 5X100M (TROCAR) ×1 IMPLANT
WARMER LAPAROSCOPE (MISCELLANEOUS) ×1 IMPLANT
WATER STERILE IRR 1000ML POUR (IV SOLUTION) ×1 IMPLANT

## 2023-11-29 NOTE — Anesthesia Preprocedure Evaluation (Addendum)
 Anesthesia Evaluation  Patient identified by MRN, date of birth, ID band Patient awake    Reviewed: Allergy & Precautions, NPO status , Patient's Chart, lab work & pertinent test results, reviewed documented beta blocker date and time   Airway Mallampati: III  TM Distance: >3 FB     Dental no notable dental hx.    Pulmonary sleep apnea    breath sounds clear to auscultation       Cardiovascular hypertension, +CHF (preserved EF)  (-) CAD, (-) Past MI and (-) Cardiac Stents + dysrhythmias Atrial Fibrillation  Rhythm:Irregular Rate:Normal     Neuro/Psych TIA   GI/Hepatic ,GERD  ,,(+) Cirrhosis       cholecystitis   Endo/Other  diabetes, Type 2    Renal/GU Renal disease     Musculoskeletal  (+) Arthritis ,    Abdominal   Peds  Hematology   Anesthesia Other Findings   Reproductive/Obstetrics                             Anesthesia Physical Anesthesia Plan  ASA: 3  Anesthesia Plan: General   Post-op Pain Management:    Induction: Intravenous  PONV Risk Score and Plan: 2 and Ondansetron and Dexamethasone  Airway Management Planned: Oral ETT  Additional Equipment:   Intra-op Plan:   Post-operative Plan: Extubation in OR  Informed Consent: I have reviewed the patients History and Physical, chart, labs and discussed the procedure including the risks, benefits and alternatives for the proposed anesthesia with the patient or authorized representative who has indicated his/her understanding and acceptance.     Dental advisory given  Plan Discussed with: CRNA  Anesthesia Plan Comments:         Anesthesia Quick Evaluation

## 2023-11-29 NOTE — Interval H&P Note (Signed)
 History and Physical Interval Note:  11/29/2023 7:10 AM  [name].  has presented today for surgery, with the diagnosis of CHOLECYSTITIS.  The various methods of treatment have been discussed with the patient and family. After consideration of risks, benefits and other options for treatment, the patient has consented to  Procedure(s) with comments: LAPAROSCOPIC CHOLECYSTECTOMY WITH INTRAOPERATIVE CHOLANGIOGRAM (N/A) - POSSIBLE LIVER RESECTION INDOCYANINE GREEN FLUORESCENCE IMAGING (ICG) (N/A) as a surgical intervention.  The patient's history has been reviewed, patient examined, no change in status, stable for surgery. Denies fevers or chills since discharge home. I have reviewed the patient's chart and labs.  Questions were answered to the patient's satisfaction.  Proceed to OR. Postop dispo pending intraoperative findings.   Fritzi Mandes

## 2023-11-29 NOTE — Anesthesia Procedure Notes (Signed)
 Procedure Name: Intubation Date/Time: 11/29/2023 7:38 AM  Performed by: Sandie Ano, CRNAPre-anesthesia Checklist: Patient identified, Emergency Drugs available, Suction available and Patient being monitored Patient Re-evaluated:Patient Re-evaluated prior to induction Oxygen Delivery Method: Circle System Utilized Preoxygenation: Pre-oxygenation with 100% oxygen Induction Type: IV induction Ventilation: Mask ventilation without difficulty Laryngoscope Size: Mac and 4 Grade View: Grade II Tube type: Oral Tube size: 7.5 mm Number of attempts: 1 Airway Equipment and Method: Stylet and Oral airway Placement Confirmation: ETT inserted through vocal cords under direct vision, positive ETCO2 and breath sounds checked- equal and bilateral Secured at: 23 cm Tube secured with: Tape Dental Injury: Teeth and Oropharynx as per pre-operative assessment

## 2023-11-29 NOTE — Transfer of Care (Signed)
 Immediate Anesthesia Transfer of Care Note  Patient: [name].  Procedure(s) Performed: LAPAROSCOPIC CHOLECYSTECTOMY WITH INTRAOPERATIVE CHOLANGIOGRAM (Abdomen) INDOCYANINE GREEN FLUORESCENCE IMAGING (ICG) (Abdomen)  Patient Location: PACU  Anesthesia Type:General  Level of Consciousness: awake, alert , and oriented  Airway & Oxygen Therapy: Patient Spontanous Breathing  Post-op Assessment: Report given to RN and Post -op Vital signs reviewed and stable  Post vital signs: Reviewed and stable  Last Vitals:  Vitals Value Taken Time  BP 112/87 11/29/23 0932  Temp    Pulse 95 11/29/23 0935  Resp 17 11/29/23 0935  SpO2 92 % 11/29/23 0935  Vitals shown include unfiled device data.  Last Pain:  Vitals:   11/29/23 0659  TempSrc:   PainSc: 3          Complications: No notable events documented.

## 2023-11-29 NOTE — Anesthesia Postprocedure Evaluation (Signed)
 Anesthesia Post Note  Patient: [name].  Procedure(s) Performed: LAPAROSCOPIC CHOLECYSTECTOMY WITH INTRAOPERATIVE CHOLANGIOGRAM (Abdomen) INDOCYANINE GREEN FLUORESCENCE IMAGING (ICG) (Abdomen)     Patient location during evaluation: PACU Anesthesia Type: General Level of consciousness: awake and alert Pain management: pain level controlled Vital Signs Assessment: post-procedure vital signs reviewed and stable Respiratory status: spontaneous breathing, nonlabored ventilation, respiratory function stable and patient connected to nasal cannula oxygen Cardiovascular status: blood pressure returned to baseline and stable Postop Assessment: no apparent nausea or vomiting Anesthetic complications: no   No notable events documented.  Last Vitals:  Vitals:   11/29/23 0945 11/29/23 1100  BP: (!) 142/81 (!) 136/103  Pulse: 80 69  Resp: 19 14  Temp:    SpO2: 97% 96%    Last Pain:  Vitals:   11/29/23 1030  TempSrc:   PainSc: 3                  Mariann Barter

## 2023-11-29 NOTE — Op Note (Signed)
 Date: 11/29/23  Patient: [name]. MRN: [id number]  Preoperative Diagnosis: Cholelithiasis with Mirrizzi syndrome, NASH cirrhosis Postoperative Diagnosis: Chronic cholecystitis, cholelithiasis with Mirrizzi syndrome, NASH cirrhosis  Procedure:  Laparoscopic cholecystectomy with fluorescence ICG imaging Intraoperative cholangiogram Balloon catheter extraction of cystic duct stone  Surgeon: Sophronia Simas, MD Assistant: Emelia Loron, MD  EBL: 25 mL  Anesthesia: General endotracheal  Specimens:  Gallbladder wall (frozen) Gallbladder  Indications: [name] is a 63 yo male who presented last week with abdominal pain and jaundice, and was found to have elevated LFTs with a bilirubin of 13. Imaging workup showed cholelithiasis, with a stone lodged in the distal cystic. He underwent an ERCP on 3/27, which showed a stone lodged in the cystic duct at the insertion into the common bile duct, causing extrinsic compression of the common duct. A plastic stent was placed, and the patient's LFTs subsequently downtrended. He was also found to have pus in the bile duct consistent with cholangitis. He has symptomatically improved since biliary decompression. After a discussion of the risks and benefits of surgery, he agreed to proceed with cholecystectomy with bile duct exploration.  Findings: Severely contracted and fibrotic gallbladder, consistent with chronic cholecystitis. No evidence of malignancy on intraoperative frozen section. Intraoperative cholangiogram confirmed a stone in the cystic duct at the insertion into the common bile duct, which was extracted using a balloon catheter. Completion cholangiogram showed no filling defects in the cystic duct stump or biliary tree. Diffusely nodular appearance of the liver consistent with early cirrhosis; no ascites.  Procedure details: Informed consent was obtained in the preoperative area prior to the procedure. The patient was brought to the  operating room and placed on the table in the supine position. General anesthesia was induced and appropriate lines and drains were placed for intraoperative monitoring. Perioperative antibiotics were administered per SCIP guidelines. The abdomen was prepped and draped in the usual sterile fashion. A pre-procedure timeout was taken verifying patient identity, surgical site and procedure to be performed.  A small supraumbilical skin incision was made, the subcutaneous tissue was divided with cautery, and the fascia was grasped and elevated. The fascia was incised and the peritoneal cavity was directly visualized. A 12mm Hassan trocar was placed and the abdomen was insufflated. The peritoneal cavity was inspected with no evidence of visceral or vascular injury. Three 5mm ports were placed in the right subcostal margin, all under direct visualization. The liver had a diffusely nodular appearance, consistent with cirrhosis. The omentum was swept caudad, exposing the gallbladder. The gallbladder was very contracted and fibrotic, consistent with chronic cholecystitis. A tenaculum was used to grasp the fundus and retract it cephalad. There were omental adhesions to the gallbladder, which were swept medially and taken off the gallbladder using cautery. The rind over the gallbladder was opened with cautery. The fissure of Connye Burkitt was visible, and it appeared that due to the severe fibrosis of the gallbladder, the common bile duct and/or right hepatic duct were possibly being pulled up into the cystic triangle. We attempted to further delineate the biliary anatomy using fluorescence ICG, but this only showed uptake in the liver; the extrahepatic bile ducts were not visible. Thus we elected to open the gallbladder at the fundus and try to perform a cholangiogram to delineate the anatomy.  The dome of the gallbladder was taken off the liver using cautery. The fundus was then opened and separated from what appeared to be the  infundibulum using cautery. The entire gallbladder was very small  and contracted. There was bleeding from the cystic artery, which was controlled with clip placement. Hemostasis was achieved in the gallbladder fossa using cautery. The specimen was then placed in an endocatch bag and extracted via the umbilical port site. The specimen was examined on the back table. There were some stones within the specimen but no discrete masses. Given the concerns for neoplasia on the preoperative imaging, two samples of the gallbladder wall were sent for frozen section, which was negative for malignancy. A cholangiocatheter was then passed through the abdominal wall and used to cannulate the cystic duct remnant. It was secured with a locking clamp. A cholangiogram was then performed under fluoroscopy. This showed a long cystic duct remnant, with a filling defect in the distal cystic duct at the insertion into the common bile duct. There was however prompt passage of contrast into the common bile duct, common hepatic duct, and left and right hepatic ducts. There was no biliary ductal dilation, and the common bile duct stent was visible, with prompt passage of contrast into the duodenum. The cholangiocatheter was removed, and a 3-Fr Fogarty catheter was passed into the cystic duct remnant. This met resistance, presumably at the impacted stone. The balloon was inflated and the catheter retracted, but no stone was extracted and the balloon ruptured. A 4-Fr Fogarty catheter with an introducer was then brought onto the field, and passed into the cystic duct stump with the introducer in place. The catheter was able to be passed into the common bile duct. The balloon was inflated with air and retracted, but no stone material was extracted. The catheter was again passed into the common bile duct. The balloon was inflated with contrast under fluoroscopic guidance, which confirmed the balloon tip was in the duodenum. The balloon was  deflated and retracted into the bile duct but became dislodged. The catheter was again passed into the duodenum, then under fluoroscopic guidance retracted into the common bile duct, and the balloon was inflated with contrast and retracted. There was resistance at the site of the stone, but the balloon catheter was able to be retracted back into the cystic duct and out through the cystic duct remnant. Stone fragments were visualized. A completion cholangiogram was performed, and showed no remaining filling defects in the cystic duct or extrahepatic biliary tree. There was free flow of contrast into the duodenum. There was no extravasation of contrast. It did appear the bile duct stent had been slightly retracted above the sphincter of Oddi. The cholangiocatheter was removed and the cystic duct stump was closed with a PDS endoloop. Because the tissue was chronically inflamed and friable, a 19-Fr JP drain was left adjacent to the cystic duct stump, and brought out through the right lateral port site. The surgical site was irrigated and appeared hemostatic.  The ports were removed under direct visualization and the abdomen was desufflated. The umbilical port site fascia was closed with a 0 vicryl pursestring suture. The skin at all port sites was closed with 4-0 monocryl subcuticular suture. The drain was secured to the skin with 2-0 Nylon suture. Dermabond was applied.  The patient tolerated the procedure well with no apparent complications. All counts were correct x2 at the end of the procedure. The patient was extubated and taken to PACU in stable condition.  Sophronia Simas, MD 11/29/23 9:37 AM

## 2023-11-30 ENCOUNTER — Other Ambulatory Visit (HOSPITAL_COMMUNITY): Payer: Self-pay

## 2023-11-30 ENCOUNTER — Other Ambulatory Visit: Payer: Self-pay

## 2023-11-30 ENCOUNTER — Telehealth: Payer: Self-pay

## 2023-11-30 ENCOUNTER — Encounter (HOSPITAL_COMMUNITY): Payer: Self-pay | Admitting: Surgery

## 2023-11-30 ENCOUNTER — Other Ambulatory Visit: Payer: Self-pay | Admitting: Pharmacy Technician

## 2023-11-30 DIAGNOSIS — K8010 Calculus of gallbladder with chronic cholecystitis without obstruction: Secondary | ICD-10-CM | POA: Diagnosis not present

## 2023-11-30 DIAGNOSIS — K7581 Nonalcoholic steatohepatitis (NASH): Secondary | ICD-10-CM | POA: Diagnosis not present

## 2023-11-30 LAB — COMPREHENSIVE METABOLIC PANEL WITH GFR
ALT: 68 U/L — ABNORMAL HIGH (ref 0–44)
AST: 53 U/L — ABNORMAL HIGH (ref 15–41)
Albumin: 3.3 g/dL — ABNORMAL LOW (ref 3.5–5.0)
Alkaline Phosphatase: 88 U/L (ref 38–126)
Anion gap: 12 (ref 5–15)
BUN: 14 mg/dL (ref 8–23)
CO2: 25 mmol/L (ref 22–32)
Calcium: 9.2 mg/dL (ref 8.9–10.3)
Chloride: 97 mmol/L — ABNORMAL LOW (ref 98–111)
Creatinine, Ser: 1.18 mg/dL (ref 0.61–1.24)
GFR, Estimated: >60 mL/min (ref >60)
Glucose, Bld: 144 mg/dL — ABNORMAL HIGH (ref 70–99)
Potassium: 4.1 mmol/L (ref 3.5–5.1)
Sodium: 134 mmol/L — ABNORMAL LOW (ref 135–145)
Total Bilirubin: 6.2 mg/dL — ABNORMAL HIGH (ref 0.0–1.2)
Total Protein: 5.9 g/dL — ABNORMAL LOW (ref 6.5–8.1)

## 2023-11-30 LAB — CBC
HCT: 46.8 % (ref 39.0–52.0)
Hemoglobin: 16.5 g/dL (ref 13.0–17.0)
MCH: 29.3 pg (ref 26.0–34.0)
MCHC: 35.3 g/dL (ref 30.0–36.0)
MCV: 83.1 fL (ref 80.0–100.0)
Platelets: 157 10*3/uL (ref 150–400)
RBC: 5.63 MIL/uL (ref 4.22–5.81)
RDW: 19 % — ABNORMAL HIGH (ref 11.5–15.5)
WBC: 7.8 10*3/uL (ref 4.0–10.5)
nRBC: 0 % (ref 0.0–0.2)

## 2023-11-30 LAB — GLUCOSE, CAPILLARY: Glucose-Capillary: 116 mg/dL — ABNORMAL HIGH (ref 70–99)

## 2023-11-30 LAB — SURGICAL PATHOLOGY

## 2023-11-30 MED ORDER — METHOCARBAMOL 500 MG PO TABS
500.0000 mg | ORAL_TABLET | Freq: Four times a day (QID) | ORAL | 0 refills | Status: DC | PRN
  Filled 2023-11-30: qty 20, 5d supply, fill #0

## 2023-11-30 MED ORDER — AMOXICILLIN-POT CLAVULANATE 875-125 MG PO TABS
1.0000 | ORAL_TABLET | Freq: Two times a day (BID) | ORAL | 0 refills | Status: AC
  Filled 2023-11-30: qty 4, 2d supply, fill #0

## 2023-11-30 MED ORDER — OXYCODONE HCL 5 MG PO TABS
5.0000 mg | ORAL_TABLET | Freq: Four times a day (QID) | ORAL | 0 refills | Status: AC | PRN
  Filled 2023-11-30: qty 12, 3d supply, fill #0

## 2023-11-30 NOTE — Discharge Instructions (Signed)
 [facility] DISCHARGE INSTRUCTIONS  Activity No heavy lifting greater than 15 pounds for 4 weeks after surgery. Ok to shower, but do not bathe or submerge incisions underwater. Do not drive while taking narcotic pain medication. You may drive when you are no longer taking prescription pain medication, you can comfortably wear a seatbelt, and you can safely maneuver your car and apply brakes.  Wound Care Your incisions are covered with skin glue called Dermabond. This will peel off on its own over time. You may shower and allow warm soapy water to run over your incisions. Gently pat dry. Do not submerge your incision underwater until cleared by your surgeon. Monitor your incision for any new redness, tenderness, or drainage. Many patients will experience some swelling and bruising at the incisions.  Ice packs will help.  Swelling and bruising can take several days to resolve.   [facility] DRAIN CARE INSTRUCTIONS Wash your hands prior to caring for your drain. Uncap the bulb to release the suction. Pour out the bulb contents into a measuring cup and record the amount. Squeeze the bulb and replace the cap to apply suction again. You should empty your drain each time you notice the bulb is full. Empty at least once a day and more often when the bulb fills up.  Please keep a daily log of your drain output and bring this with you when you come to clinic.   Medications A  prescription for pain medication may be given to you upon discharge.  Take your pain medication as prescribed, if needed.  If narcotic pain medicine is not needed, then you may take acetaminophen (Tylenol) or ibuprofen (Advil) as needed. It is common to experience some constipation if taking pain medication after surgery.  Increasing fluid intake and taking a stool softener (such as Colace) will usually help or prevent this problem from occurring.  A mild laxative (Milk of Magnesia or Miralax) should be taken according  to package directions if there are no bowel movements after 48 hours. Take your usually prescribed medications unless otherwise directed. If you need a refill on your pain medication, please contact your pharmacy.  They will contact our office to request authorization. Prescriptions will not be filled after 5 pm or on weekends.  When to Call us: Fever greater than 100.5 New redness, drainage, or swelling at incision site Severe pain, nausea, or vomiting Persistent bleeding from incisions Jaundice (yellowing of the whites of the eyes or skin)  Follow-up You will have a drain check on Monday April 7, and will then have a postop visit with Dr. Freida Busman in about a month - our office will contact you to schedule these appointments, or you may call the number below. This will be at the [facility] office at [address], [place], [place]. Please arrive at least 15 minutes prior to your scheduled appointment time.  IF YOU HAVE DISABILITY OR FAMILY LEAVE FORMS, YOU MUST BRING THEM TO THE OFFICE FOR PROCESSING.   DO NOT GIVE THEM TO YOUR DOCTOR.  The clinic staff is available to answer your questions during regular business hours.  Please don't hesitate to call and ask to speak to one of the nurses for clinical concerns.  If you have a medical emergency, go to the nearest emergency room or call 911.  A surgeon from [facility] is always on call at the hospital  [address], [place], [place]  [address] ?  [address] [address], [place], [place]   [address] [phone] ? Toll Free: [phone] ? FAX [phone] Web site: [email or web address]      Managing Your Pain After Surgery Without Opioids    Thank you for participating in our program to help patients manage their pain after surgery without opioids. This is part of our effort to provide you with the best care possible, without exposing you or your family to the risk that opioids  pose.  What pain can I expect after surgery? You can expect to have some pain after surgery. This is normal. The pain is typically worse the day after surgery, and quickly begins to get better. Many studies have found that many patients are able to manage their pain after surgery with Over-the-Counter (OTC) medications such as Tylenol and Motrin. If you have a condition that does not allow you to take Tylenol or Motrin, notify your surgical team.  How will I manage my pain? The best strategy for controlling your pain after surgery is around the clock pain control with Tylenol (acetaminophen) and Motrin (ibuprofen or Advil). Alternating these medications with each other allows you to maximize your pain control. In addition to Tylenol and Motrin, you can use heating pads or ice packs on your incisions to help reduce your pain.  How will I alternate your regular strength over-the-counter pain medication? You will take a dose of pain medication every three hours. Start by taking 650 mg of Tylenol (2 pills of 325 mg) 3 hours later take 600 mg of Motrin (3 pills of 200 mg) 3 hours after taking the Motrin take 650 mg of Tylenol 3 hours after that take 600 mg of Motrin.   - 1 -  See example - if your first dose of Tylenol is at 12:00 PM   12:00 PM Tylenol 650 mg (2 pills of 325 mg)  3:00 PM Motrin 600 mg (3 pills of 200 mg)  6:00 PM Tylenol 650 mg (2 pills of 325 mg)  9:00 PM Motrin 600 mg (3 pills of 200 mg)  Continue alternating every 3 hours   We recommend that you follow this schedule around-the-clock for at least 3 days after surgery, or until you feel that it is no longer needed. Use the table on the last page of this handout to keep track of the medications you are taking. Important: Do not take more than 3000mg  of Tylenol or 3200mg  of Motrin in a 24-hour period. Do not take ibuprofen/Motrin if you have a history of bleeding stomach ulcers, severe kidney disease, &/or actively taking a  blood thinner  What if I still have pain? If you have pain that is not controlled with the over-the-counter pain medications (Tylenol and Motrin or Advil) you might have what we call "breakthrough" pain. You will receive a prescription for a small amount of an opioid pain medication such as Oxycodone, Tramadol, or Tylenol with Codeine. Use these opioid pills in the first 24 hours after surgery if you have breakthrough pain. Do not take more than 1 pill every 4-6 hours.  If you still have uncontrolled pain after using all opioid pills, don't hesitate to call our staff using the number provided. We will help make sure you are managing your pain in the best way possible, and if necessary, we can provide a prescription for additional pain medication.   Day 1    Time  Name of Medication Number of pills taken  Amount of Acetaminophen  Pain Level  Comments  AM PM       AM PM       AM PM       AM PM       AM PM       AM PM       AM PM       AM PM       Total Daily amount of Acetaminophen Do not take more than  3,000 mg per day      Day 2    Time  Name of Medication Number of pills taken  Amount of Acetaminophen  Pain Level   Comments  AM PM       AM PM       AM PM       AM PM       AM PM       AM PM       AM PM       AM PM       Total Daily amount of Acetaminophen Do not take more than  3,000 mg per day      Day 3    Time  Name of Medication Number of pills taken  Amount of Acetaminophen  Pain Level   Comments  AM PM       AM PM       AM PM       AM PM         AM PM       AM PM       AM PM       AM PM       Total Daily amount of Acetaminophen Do not take more than  3,000 mg per day      Day 4    Time  Name of Medication Number of pills taken  Amount of Acetaminophen  Pain Level   Comments  AM PM       AM PM       AM PM       AM PM       AM PM       AM PM       AM PM       AM PM       Total Daily amount of Acetaminophen Do not take more  than  3,000 mg per day      Day 5    Time  Name of Medication Number of pills taken  Amount of Acetaminophen  Pain Level   Comments  AM PM       AM PM       AM PM       AM PM       AM PM       AM PM       AM PM       AM PM       Total Daily amount of Acetaminophen Do not take more than  3,000 mg per day      Day 6    Time  Name of Medication Number of pills taken  Amount of Acetaminophen  Pain Level  Comments  AM PM       AM PM       AM PM       AM PM       AM PM       AM PM       AM PM  AM PM       Total Daily amount of Acetaminophen Do not take more than  3,000 mg per day      Day 7    Time  Name of Medication Number of pills taken  Amount of Acetaminophen  Pain Level   Comments  AM PM       AM PM       AM PM       AM PM       AM PM       AM PM       AM PM       AM PM       Total Daily amount of Acetaminophen Do not take more than  3,000 mg per day        For additional information about how and where to safely dispose of unused opioid medications - [employer]  Disclaimer: This document contains information and/or instructional materials adapted from [facility] for the typical patient with your condition. It does not replace medical advice from your health care provider because your experience may differ from that of the typical patient. Talk to your health care provider if you have any questions about this document, your condition or your treatment plan. Adapted from [facility]

## 2023-11-30 NOTE — Discharge Summary (Signed)
 Physician Discharge Summary   Patient ID: [name] [id number] 63 y.o. 1960-10-13  Admit date: 11/29/2023  Discharge date and time: 11/30/2023  9:55 AM   Admitting Physician: Fritzi Mandes, MD   Discharge Physician: Sophronia Simas, MD  Admission Diagnoses: Cholecystitis [K81.9] Chronic cholecystitis [K81.1]  Discharge Diagnoses: Same  Admission Condition: good  Discharged Condition: good  Indication for Admission: [name] is a 63 yo male who was recently admitted with abdominal pain and elevated LFTs, and found to have a stone impacted in the cystic duct with compression of the common bile duct. He underwent an ERCP with stent placement in the common bile duct, with subsequent improvement in LFTs. The stone in the distal cystic duct was not able to be removed endoscopically. After a discussion of the risks and benefits of surgery, he agreed to undergo cholecystectomy with bile duct exploration.  Hospital Course: The patient was taken to the OR on 3/31 for a laparoscopic cholecystectomy and transcystic extraction of the impacted stone in the cystic duct. Please see separately dictated operative note for further details of the procedure. Postoperatively he was admitted to the med-surg floor in stable condition. He was advanced to a regular diet. On the morning of POD1, his pain was controlled, and he was afebrile and hemodynamically stable. His surgical drain was nonbilious. He was examined and deemed appropriate for discharge home. He will follow up next week for a drain check.   Consults: None  Significant Diagnostic Studies:  Surgical Pathology: A. GALLBLADDER WALL, BIOPSY:  Dense fibrotic hyalinized stroma with chronic inflammation,  calcification and fragments of choleliths  Negative for malignancy   B. GALLBLADDER, CHOLECYSTECTOMY:  Chronic cholecystitis with cholelithiasis  Negative for malignancy   Treatments: antibiotics: Augmentin, analgesia: acetaminophen,  Dilaudid, and oxycodone, and surgery: Laparoscopic cholecystectomy, intraoperative cholangiogram, transcystic common bile duct exploration  Discharge Exam: General: resting comfortably, NAD Neuro: alert and oriented, no focal deficits Resp: normal work of breathing on room air Abdomen: soft, nondistended, incisions clean and dry with no erythema or induration. RUQ JP serosanguinous. Extremities: warm and well-perfused   Disposition: Discharge disposition: 01-Home or Self Care       Patient Instructions:  Allergies as of 11/30/2023       Reactions   Morphine And Codeine Other (See Comments)   Hallucinations        Medication List     TAKE these medications    acetaminophen 500 MG tablet Commonly known as: TYLENOL Take 500 mg by mouth daily as needed for mild pain (pain score 1-3) or moderate pain (pain score 4-6).   amoxicillin-clavulanate 875-125 MG tablet Commonly known as: AUGMENTIN Take 1 tablet by mouth 2 (two) times daily for 2 days.   b complex vitamins capsule Take 1 capsule by mouth daily.   carvedilol 12.5 MG tablet Commonly known as: COREG Take 1 tablet (12.5 mg total) by mouth 2 (two) times daily.   cefadroxil 500 MG capsule Commonly known as: DURICEF Take 1 capsule (500 mg total) by mouth 2 (two) times daily.   Entresto 24-26 MG Generic drug: sacubitril-valsartan Take 1 tablet by mouth 2 (two) times daily.   Jardiance 10 MG Tabs tablet Generic drug: empagliflozin Take 1 tablet (10 mg total) by mouth daily.   methocarbamol 500 MG tablet Commonly known as: ROBAXIN Take 1 tablet (500 mg total) by mouth every 6 (six) hours as needed for muscle spasms.   Mounjaro 10 MG/0.5ML Pen Generic drug: tirzepatide Inject 10 mg into  the skin once a week.   multivitamin with minerals Tabs tablet Take 1 tablet by mouth daily.   Nasal Spray 0.05 % Soln Place 1 spray into the nose at bedtime as needed (congestion).   oxyCODONE 5 MG immediate release  tablet Commonly known as: Oxy IR/ROXICODONE Take 1 tablet (5 mg total) by mouth every 6 (six) hours as needed for up to 5 days for severe pain (pain score 7-10).   pantoprazole 40 MG tablet Commonly known as: PROTONIX Take 1 tablet (40 mg total) by mouth daily.   rosuvastatin 10 MG tablet Commonly known as: CRESTOR Take 1 tablet (10 mg total) by mouth daily.   Taltz 80 MG/ML pen Generic drug: ixekizumab Inject 1 ml (80mg ) under the skin every 4 weeks.   Vitamin D (Cholecalciferol) 25 MCG (1000 UT) Caps Take 1,000 Units by mouth daily.   Xarelto 20 MG Tabs tablet Generic drug: rivaroxaban Take 1 tablet (20 mg total) by mouth daily with supper.       Activity: no driving while on analgesics and no heavy lifting for 4 weeks Diet: diabetic diet Wound Care: none needed  Follow-up in office in 1 week for a drain check.  Signed: Fritzi Mandes 11/30/2023 1:19 PM

## 2023-11-30 NOTE — Telephone Encounter (Signed)
 ----- Message from Lemar Lofty sent at 11/30/2023  8:14 AM EDT ----- Regarding: RE: cystic duct stone [name], Place this patient for ERCP with me in 4-6 weeks. Biliary stent removal. Hx cystic duct stone. Thanks. GM ----- Message ----- From: Iva Boop, MD Sent: 11/30/2023   8:02 AM EDT To: Fritzi Mandes, MD; Lemar Lofty., MD Subject: RE: cystic duct stone                          It would be great if you took care of this, Gabe.  I had not scheduled him for anything.  Baldo Ash ----- Message ----- From: Lemar Lofty., MD Sent: 11/30/2023   6:20 AM EDT To: Fritzi Mandes, MD; Iva Boop, MD Subject: RE: cystic duct stone                          Team, I see what SA is seeing as well. Probably will do OK. Will need LFTs checked. But unless overt indication, probably get this stent out in the next 4-6 weeks so he doesn't develop issues of sphincterotomy stenosis that will make things for difficult for removal of the stent. CEG, when do you have him scheduled? Do you want me to have him on my list? GM ----- Message ----- From: Iva Boop, MD Sent: 11/29/2023  12:30 PM EDT To: Fritzi Mandes, MD; Lemar Lofty., MD Subject: RE: cystic duct stone                          Good news that the stone is out.  I am going to see what Liz Beach thinks about this.  It has been a long time since I have taken a stent out above the papilla.  Baldo Ash ----- Message ----- From: Fritzi Mandes, MD Sent: 11/29/2023   9:44 AM EDT To: Iva Boop, MD; Lemar Lofty., MD Subject: RE: cystic duct stone                          Al Pimple, I did a cholecystectomy on this patient this morning, and was able to get the cystic duct stone out through a transcystic approach using a balloon catheter. Unfortunately I think I also inadvertently pulled the biliary stent back into the bile duct a little bit when I did this - it looks on the completion  cholangiogram like the tip of the stent is now above the sphincter. I'm so sorry about this, I hope it doesn't overly complicate the stent removal. I'll keep him overnight tonight and am planning to discharge him tomorrow. Thanks, Mitzi Davenport ----- Message ----- From: Lemar Lofty., MD Sent: 11/26/2023  10:49 AM EDT To: Fritzi Mandes, MD; Iva Boop, MD Subject: RE: cystic duct stone                          SA, Reasonable. I would say that if you end up having significant challenges or difficulty with the cholecystectomy and getting the stone out, we know we will have the bile duct stent in place and can attempt other options endoscopically as able, with the hope that you do not have to end up doing a more significant bile duct reconstruction. Let Baldo Ash and I know how things go  on Monday. GM ----- Message ----- From: Fritzi Mandes, MD Sent: 11/26/2023  10:44 AM EDT To: Iva Boop, MD; Lemar Lofty., MD Subject: RE: cystic duct stone                          Thanks for your input. I think he still at least needs his gallbladder out, and went ahead and scheduled him for this Monday. I think that will be the best thing to just go ahead and do surgery at this point, and I can do a common duct exploration intra-op if needed. Regardless I will leave his stent in place, it would be helpful to have if we have to do a subtotal cholecystectomy. If for some reason I am not able to get the stone out, I will let you know. Thanks, Mitzi Davenport ----- Message ----- From: Lemar Lofty., MD Sent: 11/26/2023  10:41 AM EDT To: Fritzi Mandes, MD; Iva Boop, MD Subject: RE: cystic duct stone                          SA, Thanks for reaching out. The distal bile duct is at least 3 mm in size usually for [place] to get the cholangioscope to consider spyglass cholangioscopy and EHL, needs to be around 4 to 4-1/2 mm, but does not necessarily mean that that is impossible and that the  duct may be able to accommodate that.  We can certainly consider trying to see if we can get a wire to go into the cystic duct and subsequently see if we can try to pull the stone out or break it up; Baldo Ash was able to actually fill the cystic duct into the gallbladder so this suggests that there is still some patency to the area. It would probably be ideal for things to decompress unless he is having active cholecystitis, which I think Baldo Ash thought could be occurring. When do you think you are taking him for surgery? Right now I am out quite a few weeks for outpatient ERCP. Are you thinking this is going to need to be inpatient surgery? Thanks. GM ----- Message ----- From: Fritzi Mandes, MD Sent: 11/26/2023   9:18 AM EDT To: Lemar Lofty., MD Subject: cystic duct stone                              GM, I just wanted your input on this patient who Leone Payor did an ERCP on yesterday. It looks like he is not here the rest of the week. 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 He has a stent and bilirubin is coming down today. Do you think it would be possible to remove the stone endoscopically? I am planning to schedule him for surgery, but it may be tough to get the stone out surgically because it's so distal in the duct. It can be done but won't be straightforward, so I just wanted to see if there are any other options for him. Thanks for your input. Thanks, [place]

## 2023-11-30 NOTE — Plan of Care (Signed)

## 2023-11-30 NOTE — Progress Notes (Signed)
 Specialty Pharmacy Refill Coordination Note  Vihaan Gloss. is a 63 y.o. male contacted today regarding refills of specialty medication(s) No data recorded  Patient requested (Patient-Rptd) Pickup at [facility] date: (Patient-Rptd) 12/06/23   Medication will be filled on 12/03/23.

## 2023-11-30 NOTE — Progress Notes (Signed)
 DISCHARGE NOTE HOME [name]. to be discharged Home per MD order. Discussed prescriptions and follow up appointments with the patient. Prescriptions given to patient; medication list explained in detail. Patient verbalized understanding.  Skin clean, dry and intact without evidence of skin break down, no evidence of skin tears noted. IV catheter discontinued intact. Site without signs and symptoms of complications. Dressing and pressure applied. Pt denies pain at the site currently. No complaints noted.  See LDA for Surgical incisions on abdomin and drain right side  An After Visit Summary (AVS) was printed and given to the patient. Patient escorted via wheelchair, and discharged home via private auto.  Velia Meyer, RN

## 2023-12-01 ENCOUNTER — Other Ambulatory Visit (HOSPITAL_COMMUNITY): Payer: Self-pay

## 2023-12-01 ENCOUNTER — Telehealth: Payer: Self-pay

## 2023-12-01 ENCOUNTER — Other Ambulatory Visit: Payer: Self-pay

## 2023-12-01 NOTE — Telephone Encounter (Signed)
 [facility] Pre-operative Risk Assessment     Request for surgical clearance:     Endoscopy Procedure  What type of surgery is being performed?     ERCP    When is this surgery scheduled?     01/31/24  What type of clearance is required ?   Pharmacy  Are there any medications that need to be held prior to surgery and how long? Xarelto  Practice name and name of physician performing surgery?      [facility]  What is your office phone and fax number?      Phone- [phone]  Fax- [phone]  Anesthesia type (None, local, MAC, general) ?       MAC   Please route your response to Hilma Favors RN

## 2023-12-01 NOTE — Progress Notes (Signed)
 Internal Medicine Clinic Attending  I was physically present during the key portions of the resident provided service and participated in the medical decision making of patient's management care. I reviewed pertinent patient test results.  The assessment, diagnosis, and plan were formulated together and I agree with the documentation in the resident's note.  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  Further testing and interventions needed in inpatient setting.  He is stable and direct admission to our inpatient service is pursued.   Miguel Aschoff, MD

## 2023-12-01 NOTE — Telephone Encounter (Signed)
 ERCP set up for 01/31/24 at 945 am at [facility] with GM

## 2023-12-02 NOTE — Telephone Encounter (Signed)
 ERCP scheduled, pt instructed and medications reviewed.  Patient instructions mailed to home.  Patient to call with any questions or concerns.  He will be contacted regarding xarelto  He was advised to stop the mounjaro as well 7 days prior.

## 2023-12-02 NOTE — Telephone Encounter (Signed)
 Patient with diagnosis of atrial fibrillation on Xarelto for anticoagulation.    What type of surgery is being performed?     ERCP    When is this surgery scheduled?     01/31/24    CHA2DS2-VASc Score = 3   This indicates a 3.2% annual risk of stroke. The patient's score is based upon: CHF History: 1 HTN History: 1 Diabetes History: 1 Stroke History: 0 Vascular Disease History: 0 Age Score: 0 Gender Score: 0    CrCl 103 Platelet count 157  Per office protocol, patient can hold Xarelto for 2 days prior to procedure.   Patient will not need bridging with Lovenox (enoxaparin) around procedure.  **This guidance is not considered finalized until pre-operative APP has relayed final recommendations.**

## 2023-12-02 NOTE — Telephone Encounter (Signed)
 Noted pt. advised

## 2023-12-02 NOTE — Telephone Encounter (Signed)
 The pt has been advised to stop xarelto 2 days prior to the procedure. The pt has been advised of the information and verbalized understanding.       Per office protocol, patient can hold Xarelto for 2 days prior to procedure.   Patient will not need bridging with Lovenox (enoxaparin) around procedure.

## 2023-12-02 NOTE — Telephone Encounter (Signed)
   Name: [name].  DOB: October 07, 1960  MRN: [id number]   Primary Cardiologist: Kristeen Miss, MD  Chart reviewed as part of pre-operative protocol coverage.   Per Pharm D, patient may hold Xarelto for 2 days prior to procedure.    I will route this recommendation to the requesting party via Epic fax function and remove from pre-op pool. Please call with questions.  Carlos Levering, NP 12/02/2023, 3:11 PM

## 2023-12-03 ENCOUNTER — Encounter: Payer: Self-pay | Admitting: Internal Medicine

## 2023-12-03 ENCOUNTER — Other Ambulatory Visit: Payer: Self-pay

## 2023-12-03 ENCOUNTER — Other Ambulatory Visit (HOSPITAL_COMMUNITY): Payer: Self-pay

## 2023-12-06 ENCOUNTER — Other Ambulatory Visit: Payer: Self-pay

## 2023-12-06 ENCOUNTER — Encounter: Payer: Self-pay | Admitting: Internal Medicine

## 2023-12-06 ENCOUNTER — Ambulatory Visit: Payer: Commercial Managed Care - PPO | Admitting: Internal Medicine

## 2023-12-06 VITALS — BP 129/85 | HR 77 | Temp 98.5°F | Resp 16 | Ht 70.0 in | Wt 246.6 lb

## 2023-12-06 DIAGNOSIS — T8453XD Infection and inflammatory reaction due to internal right knee prosthesis, subsequent encounter: Secondary | ICD-10-CM

## 2023-12-06 DIAGNOSIS — T8459XD Infection and inflammatory reaction due to other internal joint prosthesis, subsequent encounter: Secondary | ICD-10-CM | POA: Diagnosis not present

## 2023-12-06 DIAGNOSIS — Z79899 Other long term (current) drug therapy: Secondary | ICD-10-CM

## 2023-12-06 DIAGNOSIS — Z96659 Presence of unspecified artificial knee joint: Secondary | ICD-10-CM | POA: Diagnosis not present

## 2023-12-06 NOTE — Progress Notes (Signed)
 Patient ID: William Maroon., male   DOB: 1960-11-25, 63 y.o.   MRN: [id number]  HPI [name] is a 63yo M with hx of right knee pji with MSSA, complicated by slow wound healing, had repeat washout due to hematoma. Since then has been on cefadroxil  1000mg  po bid. He was recently hospitalized for cholecystitis, presented abdominal pain, jaundice. -- initially placed CBD stent. Did undergo lap cholecystitis 3/31 by dr Karleen Overall.also has a JP drain placed. -serosangineous fluid. No fever or chills. Not having diarrhea. Imaiging showed.  1. Impacted cystic duct calculus partially extending into the lumen of the common bile duct. Successful cystic duct calculus removal intraoperatively by balloon extraction. 2. After calculus removal, the common bile duct stent did retract slightly such that the distal stent is now within the distal common bile duct and does not extend through the ampulla.  Since surgery, he is doing better, healing slowly, not having significant diarrhea since cholecystectomy. Has had weight loss but overall feels well.  Outpatient Encounter Medications as of 12/06/2023  Medication Sig   acetaminophen  (TYLENOL ) 500 MG tablet Take 500 mg by mouth daily as needed for mild pain (pain score 1-3) or moderate pain (pain score 4-6).   b complex vitamins capsule Take 1 capsule by mouth daily.   carvedilol  (COREG ) 12.5 MG tablet Take 1 tablet (12.5 mg total) by mouth 2 (two) times daily.   cefadroxil  (DURICEF) 500 MG capsule Take 1 capsule (500 mg total) by mouth 2 (two) times daily.   empagliflozin  (JARDIANCE ) 10 MG TABS tablet Take 1 tablet (10 mg total) by mouth daily.   methocarbamol  (ROBAXIN ) 500 MG tablet Take 1 tablet (500 mg total) by mouth every 6 (six) hours as needed for muscle spasms.   Multiple Vitamin (MULTIVITAMIN WITH MINERALS) TABS tablet Take 1 tablet by mouth daily.   Oxymetazoline  HCl (NASAL SPRAY) 0.05 % SOLN Place 1 spray into the nose at bedtime as needed  (congestion).   pantoprazole  (PROTONIX ) 40 MG tablet Take 1 tablet (40 mg total) by mouth daily.   rivaroxaban  (XARELTO ) 20 MG TABS tablet Take 1 tablet (20 mg total) by mouth daily with supper.   rosuvastatin  (CRESTOR ) 10 MG tablet Take 1 tablet (10 mg total) by mouth daily.   sacubitril -valsartan  (ENTRESTO ) 24-26 MG Take 1 tablet by mouth 2 (two) times daily.   TALTZ  80 MG/ML pen Inject 1 ml (80mg ) under the skin every 4 weeks.   tirzepatide  (MOUNJARO ) 10 MG/0.5ML Pen Inject 10 mg into the skin once a week.   Vitamin D , Cholecalciferol , 1000 units CAPS Take 1,000 Units by mouth daily.    No facility-administered encounter medications on file as of 12/06/2023.     Patient Active Problem List   Diagnosis Date Noted   Cholecystitis 11/29/2023   Chronic cholecystitis 11/29/2023   Obstructive jaundice 11/25/2023   Elevated LFTs 11/23/2023   Liver cirrhosis (HCC) 11/23/2023   Calculus of gallbladder with acute cholecystitis 11/23/2023   Total bilirubin, elevated 11/23/2023   Cholestasis 11/23/2023   Cystic duct calculus 11/23/2023   Cholangitis 11/23/2023   Elevated bilirubin 11/23/2023   Jaundice 11/22/2023   Choledocholithiasis 11/22/2023   Fatigue 04/28/2023   Healthcare maintenance 04/28/2023   Polypharmacy 11/11/2022   Staphylococcal arthritis, right knee (HCC) 10/07/2022   Neck pain on left side 10/07/2022   Type 2 diabetes mellitus (HCC) 10/06/2022   (HFpEF) heart failure with preserved ejection fraction (HCC) 10/06/2022   Tubular adenoma of colon 10/18/2017   Diabetic  nephropathy associated with type 2 diabetes mellitus (HCC) 10/13/2016   Permanent atrial fibrillation (HCC) 10/13/2016   Polycythemia vera (HCC) 10/09/2014   Essential hypertension 09/17/2014   History of transient ischemic attack 09/05/2012   Gastropathy 08/17/2011   Obesity (BMI 30-39.9) 08/17/2011   Psoriatic arthritis (HCC) 08/17/2011   Obstructive sleep apnea syndrome 08/05/2009     Health  Maintenance Due  Topic Date Due   Colonoscopy  12/24/2022   OPHTHALMOLOGY EXAM  07/28/2023   Diabetic kidney evaluation - Urine ACR  10/08/2023   FOOT EXAM  10/08/2023     Review of Systems Loss 30# Physical Exam   BP 129/85   Pulse 77   Temp 98.5 F (36.9 C) (Oral)   Resp 16   Ht 5\' 10"  (1.778 m)   Wt 246 lb 9.6 oz (111.9 kg)   SpO2 96%   BMI 35.38 kg/m   Physical Exam  Constitutional: He is oriented to person, place, and time. He appears well-developed and well-nourished. No distress.  HENT:  Mouth/Throat: Oropharynx is clear and moist. No oropharyngeal exudate.  Cardiovascular: Normal rate, regular rhythm and normal heart sounds. Exam reveals no gallop and no friction rub.  No murmur heard.  Pulmonary/Chest: Effort normal and breath sounds normal. No respiratory distress. He has no wheezes.  Abdominal: Soft. Bowel sounds are normal. He exhibits no distension. There is no tenderness.  Lymphadenopathy:  He has no cervical adenopathy.  Neurological: He is alert and oriented to person, place, and time.  Skin: Skin is warm and dry. No rash noted. No erythema.  Psychiatric: He has a normal mood and affect. His behavior is normal.    CBC Lab Results  Component Value Date   WBC 7.8 11/30/2023   RBC 5.63 11/30/2023   HGB 16.5 11/30/2023   HCT 46.8 11/30/2023   PLT 157 11/30/2023   MCV 83.1 11/30/2023   MCH 29.3 11/30/2023   MCHC 35.3 11/30/2023   RDW 19.0 (H) 11/30/2023   LYMPHSABS 1,702 03/01/2023   MONOABS 0.6 04/14/2022   EOSABS 89 03/01/2023    BMET Lab Results  Component Value Date   NA 134 (L) 11/30/2023   K 4.1 11/30/2023   CL 97 (L) 11/30/2023   CO2 25 11/30/2023   GLUCOSE 144 (H) 11/30/2023   BUN 14 11/30/2023   CREATININE 1.18 11/30/2023   CALCIUM  9.2 11/30/2023   GFRNONAA >60 11/30/2023   GFRAA 74 05/27/2020   Lab Results  Component Value Date   BILITOT 2.5 (H) 12/06/2023      Assessment and Plan  Hyperbilirubinemia - 2/2 cholecystitis  -- will check CMP  Pji = continue with cefadroxil  500mg  po bid.   Long term medication management = cr is stable

## 2023-12-07 LAB — CBC WITH DIFFERENTIAL/PLATELET
Absolute Lymphocytes: 1570 {cells}/uL (ref 850–3900)
Absolute Monocytes: 583 {cells}/uL (ref 200–950)
Basophils Absolute: 43 {cells}/uL (ref 0–200)
Basophils Relative: 0.6 %
Eosinophils Absolute: 130 {cells}/uL (ref 15–500)
Eosinophils Relative: 1.8 %
HCT: 54.4 % — ABNORMAL HIGH (ref 38.5–50.0)
Hemoglobin: 17.6 g/dL — ABNORMAL HIGH (ref 13.2–17.1)
MCH: 28.8 pg (ref 27.0–33.0)
MCHC: 32.4 g/dL (ref 32.0–36.0)
MCV: 89 fL (ref 80.0–100.0)
MPV: 10.4 fL (ref 7.5–12.5)
Monocytes Relative: 8.1 %
Neutro Abs: 4874 {cells}/uL (ref 1500–7800)
Neutrophils Relative %: 67.7 %
Platelets: 259 10*3/uL (ref 140–400)
RBC: 6.11 10*6/uL — ABNORMAL HIGH (ref 4.20–5.80)
RDW: 17 % — ABNORMAL HIGH (ref 11.0–15.0)
Total Lymphocyte: 21.8 %
WBC: 7.2 10*3/uL (ref 3.8–10.8)

## 2023-12-07 LAB — COMPREHENSIVE METABOLIC PANEL WITH GFR
AG Ratio: 1.7 (calc) (ref 1.0–2.5)
ALT: 45 U/L (ref 9–46)
AST: 35 U/L (ref 10–35)
Albumin: 4.2 g/dL (ref 3.6–5.1)
Alkaline phosphatase (APISO): 90 U/L (ref 35–144)
BUN: 20 mg/dL (ref 7–25)
CO2: 21 mmol/L (ref 20–32)
Calcium: 9.5 mg/dL (ref 8.6–10.3)
Chloride: 103 mmol/L (ref 98–110)
Creat: 1.06 mg/dL (ref 0.70–1.35)
Globulin: 2.5 g/dL (ref 1.9–3.7)
Glucose, Bld: 183 mg/dL — ABNORMAL HIGH (ref 65–99)
Potassium: 4.4 mmol/L (ref 3.5–5.3)
Sodium: 136 mmol/L (ref 135–146)
Total Bilirubin: 2.5 mg/dL — ABNORMAL HIGH (ref 0.2–1.2)
Total Protein: 6.7 g/dL (ref 6.1–8.1)
eGFR: 78 mL/min/{1.73_m2} (ref >=60)

## 2023-12-08 DIAGNOSIS — G4733 Obstructive sleep apnea (adult) (pediatric): Secondary | ICD-10-CM | POA: Diagnosis not present

## 2023-12-14 ENCOUNTER — Other Ambulatory Visit: Payer: Self-pay

## 2023-12-14 ENCOUNTER — Other Ambulatory Visit (HOSPITAL_COMMUNITY): Payer: Self-pay

## 2023-12-14 DIAGNOSIS — E785 Hyperlipidemia, unspecified: Secondary | ICD-10-CM

## 2023-12-14 DIAGNOSIS — K8010 Calculus of gallbladder with chronic cholecystitis without obstruction: Secondary | ICD-10-CM | POA: Diagnosis not present

## 2023-12-14 MED ORDER — ROSUVASTATIN CALCIUM 10 MG PO TABS
10.0000 mg | ORAL_TABLET | Freq: Every day | ORAL | 1 refills | Status: DC
  Filled 2023-12-14: qty 90, 90d supply, fill #0
  Filled 2024-03-15: qty 90, 90d supply, fill #1

## 2023-12-14 NOTE — Telephone Encounter (Signed)
 Medication sent to pharmacy

## 2023-12-16 ENCOUNTER — Other Ambulatory Visit (HOSPITAL_COMMUNITY): Payer: Self-pay

## 2023-12-22 ENCOUNTER — Other Ambulatory Visit: Payer: Self-pay

## 2023-12-22 DIAGNOSIS — K8001 Calculus of gallbladder with acute cholecystitis with obstruction: Secondary | ICD-10-CM

## 2023-12-22 DIAGNOSIS — K8050 Calculus of bile duct without cholangitis or cholecystitis without obstruction: Secondary | ICD-10-CM

## 2023-12-24 ENCOUNTER — Other Ambulatory Visit (HOSPITAL_COMMUNITY): Payer: Self-pay

## 2023-12-24 DIAGNOSIS — L821 Other seborrheic keratosis: Secondary | ICD-10-CM | POA: Diagnosis not present

## 2023-12-24 DIAGNOSIS — L400 Psoriasis vulgaris: Secondary | ICD-10-CM | POA: Diagnosis not present

## 2023-12-24 DIAGNOSIS — Z85828 Personal history of other malignant neoplasm of skin: Secondary | ICD-10-CM | POA: Diagnosis not present

## 2023-12-24 MED ORDER — MUPIROCIN 2 % EX OINT
TOPICAL_OINTMENT | Freq: Two times a day (BID) | CUTANEOUS | 0 refills | Status: AC
Start: 2023-12-24 — End: ?
  Filled 2023-12-24: qty 22, 15d supply, fill #0

## 2023-12-27 ENCOUNTER — Other Ambulatory Visit: Payer: Self-pay | Admitting: Pharmacy Technician

## 2023-12-27 ENCOUNTER — Other Ambulatory Visit: Payer: Self-pay

## 2023-12-27 NOTE — Progress Notes (Signed)
 Specialty Pharmacy Refill Coordination Note  Clim Pawloski. is a 63 y.o. male contacted today regarding refills of specialty medication(s) Ixekizumab  (Taltz )   Patient requested (Patient-Rptd) Pickup at [facility] at [facility] date: (Patient-Rptd) 01/04/24   Medication will be filled on 01/03/24.

## 2023-12-28 ENCOUNTER — Other Ambulatory Visit: Payer: Self-pay

## 2023-12-28 ENCOUNTER — Other Ambulatory Visit (HOSPITAL_COMMUNITY): Payer: Self-pay

## 2023-12-29 ENCOUNTER — Other Ambulatory Visit (HOSPITAL_COMMUNITY): Payer: Self-pay

## 2023-12-29 ENCOUNTER — Encounter (HOSPITAL_COMMUNITY): Payer: Self-pay

## 2024-01-03 ENCOUNTER — Other Ambulatory Visit: Payer: Self-pay

## 2024-01-04 NOTE — Progress Notes (Unsigned)
 [facility]: Clinic Note  Subjective:  History of Present Illness: [name]. is a 63 y.o. year old male who presents for hospital follow up after admission for acute cholangitis and subsequent lap cholecystectomy.  For the past 9 months, he has felt "off" and we had 2 visits to evaluate his "brain fog," but did not determine the etiology. In March, he presented to clinic with severe malaise and abdominal pain, and was admitted for acute cholecystitis. MRCP showed impacted stone in distal cystic duct with mass effect upon common bile duct. On ERCP, there was lots of pus expressed, and GI placed stent on 11/25/23, but was unable to remove the stone. He went back for lap cholecystectomy with Dr. Leighton Punches on 11/29/23 and stone was removed. There was initially concern for malignancy, but fortunately pathology has all come back negative for malignancy. There was also concern for cirrhotic morphology of the liver both on imaging and intra-operatively. The thought is that he probably had some cholangitis brewing for a long time, but perhaps this was kept at bay by his prophylactic antibiotics that he takes for chronic knee infection.   He is here with [name] today.  Overall, he's been doing well since surgery. He has been eating bland foods and tolerating liquids. He has been belching more and not tolerating fatty fried foods, which he has avoided. No abdominal pain, nausea, or vomiting. He is back on his regular medicines including Monjaro and Xarelto .  He noticed some irritation at his ventral incision, and his dermatologist gave him mupirocin  2% ointment to use, which has been helping. He's seeing his surgeon tomorrow.  He's also had some cough and chest congestion, so has been taking Cefadroxil  2 tabs BID to fight this. No known fevers. His wife his similar symptoms.      Please refer to Assessment and Plan below for full details in Problem-Based Charting.   Past Medical  History:  Patient Active Problem List   Diagnosis Date Noted   Lipoma 01/05/2024   Chronic cholecystitis 11/29/2023   Obstructive jaundice 11/25/2023   Liver cirrhosis (HCC) 11/23/2023   Choledocholithiasis 11/22/2023   Healthcare maintenance 04/28/2023   Staphylococcal arthritis, right knee (HCC) 10/07/2022   Neck pain on left side 10/07/2022   Type 2 diabetes mellitus (HCC) 10/06/2022   Heart failure with recovered ejection fraction (HFrecEF) (HCC) 10/06/2022   Osteoarthritis of right knee 05/09/2018   Tubular adenoma of colon 10/18/2017   Diabetic nephropathy associated with type 2 diabetes mellitus (HCC) 10/13/2016   Permanent atrial fibrillation (HCC) 10/13/2016   Polycythemia vera (HCC) 10/09/2014   Essential hypertension 09/17/2014   History of transient ischemic attack 09/05/2012   Gastropathy 08/17/2011   Obesity (BMI 30-39.9) 08/17/2011   Psoriatic arthritis (HCC) 08/17/2011   Obstructive sleep apnea syndrome 08/05/2009      Medications:  Current Outpatient Medications:    acetaminophen  (TYLENOL ) 500 MG tablet, Take 500 mg by mouth daily as needed for mild pain (pain score 1-3) or moderate pain (pain score 4-6)., Disp: , Rfl:    b complex vitamins capsule, Take 1 capsule by mouth daily., Disp: , Rfl:    carvedilol  (COREG ) 12.5 MG tablet, Take 1 tablet (12.5 mg total) by mouth 2 (two) times daily., Disp: 180 tablet, Rfl: 3   cefadroxil  (DURICEF) 500 MG capsule, Take 1 capsule (500 mg total) by mouth 2 (two) times daily., Disp: 60 capsule, Rfl: 11   empagliflozin  (JARDIANCE ) 10 MG TABS tablet, Take 1  tablet (10 mg total) by mouth daily., Disp: 90 tablet, Rfl: 1   methocarbamol  (ROBAXIN ) 500 MG tablet, Take 1 tablet (500 mg total) by mouth every 6 (six) hours as needed for muscle spasms., Disp: 20 tablet, Rfl: 0   Multiple Vitamin (MULTIVITAMIN WITH MINERALS) TABS tablet, Take 1 tablet by mouth daily., Disp: , Rfl:    mupirocin  ointment (BACTROBAN ) 2 %, Apply to affected  area twice a day., Disp: 22 g, Rfl: 0   Oxymetazoline  HCl (NASAL SPRAY) 0.05 % SOLN, Place 1 spray into the nose at bedtime as needed (congestion)., Disp: , Rfl:    pantoprazole  (PROTONIX ) 40 MG tablet, Take 1 tablet (40 mg total) by mouth daily., Disp: 180 tablet, Rfl: 0   rivaroxaban  (XARELTO ) 20 MG TABS tablet, Take 1 tablet (20 mg total) by mouth daily with supper., Disp: 90 tablet, Rfl: 1   rosuvastatin  (CRESTOR ) 10 MG tablet, Take 1 tablet (10 mg total) by mouth daily., Disp: 90 tablet, Rfl: 1   sacubitril -valsartan  (ENTRESTO ) 24-26 MG, Take 1 tablet by mouth 2 (two) times daily., Disp: 180 tablet, Rfl: 1   TALTZ  80 MG/ML pen, Inject 1 ml (80mg ) under the skin every 4 weeks., Disp: 1 mL, Rfl: 11   tirzepatide  (MOUNJARO ) 10 MG/0.5ML Pen, Inject 10 mg into the skin once a week., Disp: 2 mL, Rfl: 3   Vitamin D , Cholecalciferol , 1000 units CAPS, Take 1,000 Units by mouth daily. , Disp: , Rfl:    Allergies: Allergies  Allergen Reactions   Morphine  Other (See Comments)       Objective:   Vitals: Vitals:   01/05/24 0828  BP: 114/70  Pulse: 80  Temp: 97.6 F (36.4 C)  SpO2: 98%     Physical Exam: Physical Exam Constitutional:      Appearance: Normal appearance.  Cardiovascular:     Rate and Rhythm: Normal rate. Rhythm irregular.  Pulmonary:     Effort: Pulmonary effort is normal. No respiratory distress.     Breath sounds: Normal breath sounds. No wheezing.  Abdominal:     General: Abdomen is flat. Bowel sounds are normal. There is no distension.     Palpations: Abdomen is soft. There is no mass.     Tenderness: There is no abdominal tenderness. There is no guarding.     Comments: Well healing incisions over abdomen; <1cm granulation tissue over largest incision site near umbilicus  Skin:    General: Skin is warm and dry.     Comments: +tan, not jaundiced  Neurological:     Mental Status: He is alert.  Psychiatric:        Mood and Affect: Mood normal.        Behavior:  Behavior normal.      Data: Labs, imaging, and micro were reviewed in Epic. Refer to Assessment and Plan below for full details in Problem-Based Charting.  Assessment & Plan:  Obstructive jaundice - CMP, CBC today - He will follow up with his surgeon tomorrow - He will follow up with Dr. Brice Campi on 01/31/24 for ERCP with stent removal. I have messaged Dr. Brice Campi to confirm that he will also see Josiah Nigh in clinic for evaluation and management of possible new cirrhosis - I counseled [name] on lifestyle modifications for liver disease - he already drinks very sparingly, and is working on his diet & exercise. He is avoiding fatty/fried foods  Type 2 diabetes mellitus (HCC) - Chronic and stable - A1C today - Urine microalbumin today - Continue Jardiance   10mg  daily & Mounjaro  10mg  weekly   Psoriatic arthritis (HCC) - Problem is chronic and stable - Continue Taltz  (ixekizumab ) injections, sent to [facility] specialty pharmacy  History of transient ischemic attack - Problem is chronic and stable - Continue Rosuvastatin  10mg  daily - Continue Xarelto  - he is not on aspirin  Essential hypertension - chronic and stable, BP at goal today - Continue carvedilol  12.5mg  BID, Entresto  24-26mg  BID  Polycythemia vera (HCC) - chronic and worsening - Repeat CBC today, as Hct was >50% at last check - If increasing, I recommend phlebotomy as soon as possible. If stable, Josiah Nigh would prefer to defer phlebotomy for a month or two, which I think is safe and reasonable since he's had so many procedures lately   Permanent atrial fibrillation (HCC) - This problem is chronic and stable - Continue Xarelto  20mg  daily and Carvedilol  25mg  BID   Heart failure with recovered ejection fraction (HFrecEF) (HCC) - Follows with Dr. 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 Sees Dr. Levern Reader q3 months in ID. Changed Doxy to Cefadroxil  in 07/2022, as hyperpigmentation might have been from Doxy.      Patient will follow up in 3 months with new PCP   Driscilla George, MD

## 2024-01-05 ENCOUNTER — Telehealth: Payer: Self-pay | Admitting: Internal Medicine

## 2024-01-05 ENCOUNTER — Ambulatory Visit: Admitting: Internal Medicine

## 2024-01-05 ENCOUNTER — Encounter: Payer: Self-pay | Admitting: Internal Medicine

## 2024-01-05 VITALS — BP 114/70 | HR 80 | Temp 97.6°F | Ht 70.0 in | Wt 242.9 lb

## 2024-01-05 DIAGNOSIS — E119 Type 2 diabetes mellitus without complications: Secondary | ICD-10-CM

## 2024-01-05 DIAGNOSIS — L4050 Arthropathic psoriasis, unspecified: Secondary | ICD-10-CM

## 2024-01-05 DIAGNOSIS — D45 Polycythemia vera: Secondary | ICD-10-CM

## 2024-01-05 DIAGNOSIS — K831 Obstruction of bile duct: Secondary | ICD-10-CM | POA: Diagnosis not present

## 2024-01-05 DIAGNOSIS — Z8673 Personal history of transient ischemic attack (TIA), and cerebral infarction without residual deficits: Secondary | ICD-10-CM | POA: Diagnosis not present

## 2024-01-05 DIAGNOSIS — I4821 Permanent atrial fibrillation: Secondary | ICD-10-CM

## 2024-01-05 DIAGNOSIS — I10 Essential (primary) hypertension: Secondary | ICD-10-CM

## 2024-01-05 DIAGNOSIS — I5032 Chronic diastolic (congestive) heart failure: Secondary | ICD-10-CM | POA: Diagnosis not present

## 2024-01-05 DIAGNOSIS — I110 Hypertensive heart disease with heart failure: Secondary | ICD-10-CM

## 2024-01-05 DIAGNOSIS — D1779 Benign lipomatous neoplasm of other sites: Secondary | ICD-10-CM

## 2024-01-05 DIAGNOSIS — K811 Chronic cholecystitis: Secondary | ICD-10-CM

## 2024-01-05 DIAGNOSIS — N179 Acute kidney failure, unspecified: Secondary | ICD-10-CM

## 2024-01-05 DIAGNOSIS — M00061 Staphylococcal arthritis, right knee: Secondary | ICD-10-CM

## 2024-01-05 DIAGNOSIS — K8050 Calculus of bile duct without cholangitis or cholecystitis without obstruction: Secondary | ICD-10-CM

## 2024-01-05 DIAGNOSIS — D179 Benign lipomatous neoplasm, unspecified: Secondary | ICD-10-CM | POA: Insufficient documentation

## 2024-01-05 LAB — CBC WITH DIFFERENTIAL/PLATELET
Abs Immature Granulocytes: 0.04 10*3/uL (ref 0.00–0.07)
Basophils Absolute: 0 10*3/uL (ref 0.0–0.1)
Basophils Relative: 1 %
Eosinophils Absolute: 0.1 10*3/uL (ref 0.0–0.5)
Eosinophils Relative: 2 %
HCT: 55.8 % — ABNORMAL HIGH (ref 39.0–52.0)
Hemoglobin: 19.6 g/dL — ABNORMAL HIGH (ref 13.0–17.0)
Immature Granulocytes: 1 %
Lymphocytes Relative: 31 %
Lymphs Abs: 2.1 10*3/uL (ref 0.7–4.0)
MCH: 29.4 pg (ref 26.0–34.0)
MCHC: 35.1 g/dL (ref 30.0–36.0)
MCV: 83.8 fL (ref 80.0–100.0)
Monocytes Absolute: 0.5 10*3/uL (ref 0.1–1.0)
Monocytes Relative: 8 %
Neutro Abs: 3.9 10*3/uL (ref 1.7–7.7)
Neutrophils Relative %: 57 %
Platelets: 198 10*3/uL (ref 150–400)
RBC: 6.66 MIL/uL — ABNORMAL HIGH (ref 4.22–5.81)
RDW: 17.1 % — ABNORMAL HIGH (ref 11.5–15.5)
WBC: 6.7 10*3/uL (ref 4.0–10.5)
nRBC: 0 % (ref 0.0–0.2)

## 2024-01-05 LAB — HEMOGLOBIN A1C
Hgb A1c MFr Bld: 4.8 % (ref 4.8–5.6)
Mean Plasma Glucose: 91.06 mg/dL

## 2024-01-05 LAB — COMPREHENSIVE METABOLIC PANEL WITH GFR
ALT: 29 U/L (ref 0–44)
AST: 29 U/L (ref 15–41)
Albumin: 3.9 g/dL (ref 3.5–5.0)
Alkaline Phosphatase: 63 U/L (ref 38–126)
Anion gap: 10 (ref 5–15)
BUN: 13 mg/dL (ref 8–23)
CO2: 29 mmol/L (ref 22–32)
Calcium: 10.3 mg/dL (ref 8.9–10.3)
Chloride: 100 mmol/L (ref 98–111)
Creatinine, Ser: 1.42 mg/dL — ABNORMAL HIGH (ref 0.61–1.24)
GFR, Estimated: 55 mL/min — ABNORMAL LOW (ref >60)
Glucose, Bld: 103 mg/dL — ABNORMAL HIGH (ref 70–99)
Potassium: 5 mmol/L (ref 3.5–5.1)
Sodium: 139 mmol/L (ref 135–145)
Total Bilirubin: 1.8 mg/dL — ABNORMAL HIGH (ref 0.0–1.2)
Total Protein: 6.8 g/dL (ref 6.5–8.1)

## 2024-01-05 LAB — APTT: aPTT: 44 s — ABNORMAL HIGH (ref 24–36)

## 2024-01-05 LAB — PROTIME-INR
INR: 2.1 — ABNORMAL HIGH (ref 0.8–1.2)
Prothrombin Time: 24 s — ABNORMAL HIGH (ref 11.4–15.2)

## 2024-01-05 NOTE — Assessment & Plan Note (Signed)
-   CMP, CBC today - He will follow up with his surgeon tomorrow - He will follow up with Dr. Brice Campi on 01/31/24 for ERCP with stent removal. I have messaged Dr. Brice Campi to confirm that he will also see [name] in clinic for evaluation and management of possible new cirrhosis - I counseled [name] on lifestyle modifications for liver disease - he already drinks very sparingly, and is working on his diet & exercise. He is avoiding fatty/fried foods

## 2024-01-05 NOTE — Telephone Encounter (Signed)
 Please refer to message below.   Copied from CRM [id number]. Topic: General - Other >> Jan 05, 2024 11:20 AM Brynn Caras wrote: Reason for CRM: Murlean Armour with Laboratory Department at [facility] is requesting a callback from nurse Sherrlyn Dolores on behalf of this mutual patient, attempted CAL no answer.  Callback #:[phone]

## 2024-01-05 NOTE — Assessment & Plan Note (Signed)
-   Problem is chronic and stable - Continue Rosuvastatin  10mg  daily - Continue Xarelto  - he is not on aspirin

## 2024-01-05 NOTE — Assessment & Plan Note (Signed)
-   chronic and stable, BP at goal today - Continue carvedilol  12.5mg  BID, Entresto  24-26mg  BID

## 2024-01-05 NOTE — Assessment & Plan Note (Signed)
-   chronic and worsening - Repeat CBC today, as Hct was >50% at last check - If increasing, I recommend phlebotomy as soon as possible. If stable, [name] would prefer to defer phlebotomy for a month or two, which I think is safe and reasonable since he's had so many procedures lately

## 2024-01-05 NOTE — Assessment & Plan Note (Signed)
-   This problem is chronic and stable - Continue Xarelto '20mg'$  daily and Carvedilol '25mg'$  BID

## 2024-01-05 NOTE — Patient Instructions (Addendum)
 Thank you, Mr.Jaques D Laszlo Jr. for allowing us  to provide your care today. Today we discussed your hospital follow up.    I have ordered the following labs for you:   Lab Orders         CBC with Diff         CMP14 + Anion Gap         Microalbumin / Creatinine Urine Ratio         Protime-INR         APTT         Hemoglobin A1c       Referrals ordered today:   Referral Orders  No referral(s) requested today     I have ordered the following medication/changed the following medications:   Stop the following medications: There are no discontinued medications.   Start the following medications: No orders of the defined types were placed in this encounter.    Return in about 3 months (around 04/06/2024) for Chronic medical conditions.    Remember:  - Make sure your GI doctor gives you clear instructions on when to stop your blood thinner and what type of sedation you'll have for your upcoming ERCP - You should follow up with a GI doctor (such as Dr. Brice Campi) after your ERCP to evaluate your liver more once your acute illness has resolved.   Should you have any questions or concerns please call the internal medicine clinic at [phone].     Marshae Azam, MD Faculty, Internal Medicine Teaching [facility]

## 2024-01-05 NOTE — Assessment & Plan Note (Signed)
-   Chronic and stable - A1C today - Urine microalbumin today - Continue Jardiance  10mg  daily & Mounjaro  10mg  weekly

## 2024-01-05 NOTE — Assessment & Plan Note (Signed)
-   Problem is chronic and stable - Continue Taltz (ixekizumab) injections, sent to [facility]

## 2024-01-05 NOTE — Telephone Encounter (Signed)
 Patient called to schedule appointments. The patient stated that his Doctor Driscilla George told him to follow up with Dr.Mohamed. I spoke to Dr. Marguerita Shih and he said to go ahead and schedule him. The patient is aware of the appointments made.

## 2024-01-05 NOTE — Assessment & Plan Note (Signed)
-   He had bilateral knee replacements in 2019 complicated by recurrent MSSA infections. Sees Dr. Levern Reader q3 months in ID. Changed Doxy to Cefadroxil  in 07/2022, as hyperpigmentation might have been from Doxy.

## 2024-01-05 NOTE — Assessment & Plan Note (Signed)
-   Follows with Dr. Alroy Aspen, Cardiology  - LVEF 55-60% in 07/2022 (was 35-40% in 05/2018)  - Jardiance  10mg  daily, Carvedilol  25mg  BID, Entresto  24-26mg  BID

## 2024-01-07 LAB — MICROALBUMIN / CREATININE URINE RATIO
Creatinine, Urine: 129.3 mg/dL
Microalb/Creat Ratio: 94 mg/g{creat} — ABNORMAL HIGH (ref 0–29)
Microalbumin, Urine: 121.5 ug/mL

## 2024-01-10 ENCOUNTER — Other Ambulatory Visit

## 2024-01-10 DIAGNOSIS — N179 Acute kidney failure, unspecified: Secondary | ICD-10-CM | POA: Diagnosis not present

## 2024-01-12 ENCOUNTER — Ambulatory Visit: Payer: Self-pay | Admitting: Internal Medicine

## 2024-01-12 LAB — BMP8+ANION GAP
Anion Gap: 16 mmol/L (ref 10.0–18.0)
BUN/Creatinine Ratio: 12 (ref 10–24)
BUN: 11 mg/dL (ref 8–27)
CO2: 20 mmol/L (ref 20–29)
Calcium: 9.4 mg/dL (ref 8.6–10.2)
Chloride: 97 mmol/L (ref 96–106)
Creatinine, Ser: 0.89 mg/dL (ref 0.76–1.27)
Glucose: 99 mg/dL (ref 70–99)
Potassium: 4.6 mmol/L (ref 3.5–5.2)
Sodium: 133 mmol/L — ABNORMAL LOW (ref 134–144)
eGFR: 96 mL/min/{1.73_m2} (ref >59)

## 2024-01-20 ENCOUNTER — Other Ambulatory Visit: Payer: Self-pay

## 2024-01-22 ENCOUNTER — Other Ambulatory Visit: Payer: Self-pay

## 2024-01-23 ENCOUNTER — Other Ambulatory Visit: Payer: Self-pay

## 2024-01-25 ENCOUNTER — Other Ambulatory Visit (HOSPITAL_COMMUNITY): Payer: Self-pay

## 2024-01-25 ENCOUNTER — Other Ambulatory Visit: Payer: Self-pay

## 2024-01-25 ENCOUNTER — Encounter (HOSPITAL_COMMUNITY): Payer: Self-pay | Admitting: Gastroenterology

## 2024-01-25 ENCOUNTER — Telehealth: Payer: Self-pay

## 2024-01-25 ENCOUNTER — Telehealth: Payer: Self-pay | Admitting: Gastroenterology

## 2024-01-25 NOTE — Progress Notes (Signed)
 Attempted to obtain medical history for pre op call via telephone, unable to reach at this time. HIPAA compliant voicemail message left requesting return call to pre surgical testing department.

## 2024-01-25 NOTE — Telephone Encounter (Signed)
 Procedure:ercp Procedure date: 01/31/24 Procedure location: wl Arrival Time: 815am Spoke with the patient Y/N:  I left a detail message @1 :05 pm to call the office to confirm appt. Any prep concerns? .  Has the patient obtained the prep from the pharmacy ? William Aas Do you have a care partner and transportation: . Any additional concerns? [name]

## 2024-01-25 NOTE — Progress Notes (Signed)
 Specialty Pharmacy Refill Coordination Note  [name]. is a 63 y.o. male contacted today regarding refills of specialty medication(s) Ixekizumab  (Taltz )   Patient requested [name] at [facility] at [place] date: 02/01/24   Medication will be filled on 01/31/24.

## 2024-01-25 NOTE — Progress Notes (Signed)
 Specialty Pharmacy Ongoing Clinical Assessment Note  [name]. is a 63 y.o. male who is being followed by the specialty pharmacy service for RxSp Psoriatic Arthritis   Patient's specialty medication(s) reviewed today: Ixekizumab  (Taltz )   Missed doses in the last 4 weeks: 0   Patient/Caregiver did not have any additional questions or concerns.   Therapeutic benefit summary: Patient is achieving benefit   Adverse events/side effects summary: No adverse events/side effects   Patient's therapy is appropriate to: Continue    Goals Addressed             This Visit's Progress    Minimize recurrence of flares   On track    Patient is on track. Patient will maintain adherence.  Patient reports he is well-controlled at this time and denies any recent flares.          Follow up: 6 months  [facility]

## 2024-01-31 ENCOUNTER — Ambulatory Visit (HOSPITAL_COMMUNITY)

## 2024-01-31 ENCOUNTER — Other Ambulatory Visit: Payer: Self-pay

## 2024-01-31 ENCOUNTER — Ambulatory Visit (HOSPITAL_COMMUNITY): Admitting: Anesthesiology

## 2024-01-31 ENCOUNTER — Encounter (HOSPITAL_COMMUNITY)
Admission: RE | Disposition: A | Discharge status: Home / Self Care | Payer: Self-pay | Source: Home / Self Care | Attending: Gastroenterology

## 2024-01-31 ENCOUNTER — Ambulatory Visit (HOSPITAL_COMMUNITY)
Admission: RE | Admit: 2024-01-31 | Discharge: 2024-01-31 | Disposition: A | Discharge status: Home / Self Care | Attending: Gastroenterology | Admitting: Gastroenterology

## 2024-01-31 ENCOUNTER — Other Ambulatory Visit (HOSPITAL_COMMUNITY): Payer: Self-pay

## 2024-01-31 ENCOUNTER — Encounter (HOSPITAL_COMMUNITY): Payer: Self-pay | Admitting: Gastroenterology

## 2024-01-31 DIAGNOSIS — I4821 Permanent atrial fibrillation: Secondary | ICD-10-CM

## 2024-01-31 DIAGNOSIS — K8050 Calculus of bile duct without cholangitis or cholecystitis without obstruction: Secondary | ICD-10-CM | POA: Diagnosis not present

## 2024-01-31 DIAGNOSIS — T85520A Displacement of bile duct prosthesis, initial encounter: Secondary | ICD-10-CM

## 2024-01-31 DIAGNOSIS — I509 Heart failure, unspecified: Secondary | ICD-10-CM | POA: Diagnosis not present

## 2024-01-31 DIAGNOSIS — I110 Hypertensive heart disease with heart failure: Secondary | ICD-10-CM | POA: Diagnosis not present

## 2024-01-31 DIAGNOSIS — I5030 Unspecified diastolic (congestive) heart failure: Secondary | ICD-10-CM

## 2024-01-31 DIAGNOSIS — R932 Abnormal findings on diagnostic imaging of liver and biliary tract: Secondary | ICD-10-CM | POA: Diagnosis not present

## 2024-01-31 DIAGNOSIS — I4811 Longstanding persistent atrial fibrillation: Secondary | ICD-10-CM | POA: Insufficient documentation

## 2024-01-31 DIAGNOSIS — Z8673 Personal history of transient ischemic attack (TIA), and cerebral infarction without residual deficits: Secondary | ICD-10-CM | POA: Insufficient documentation

## 2024-01-31 DIAGNOSIS — K219 Gastro-esophageal reflux disease without esophagitis: Secondary | ICD-10-CM | POA: Insufficient documentation

## 2024-01-31 DIAGNOSIS — G4730 Sleep apnea, unspecified: Secondary | ICD-10-CM | POA: Diagnosis not present

## 2024-01-31 DIAGNOSIS — Z6834 Body mass index (BMI) 34.0-34.9, adult: Secondary | ICD-10-CM | POA: Diagnosis not present

## 2024-01-31 DIAGNOSIS — K8051 Calculus of bile duct without cholangitis or cholecystitis with obstruction: Secondary | ICD-10-CM | POA: Diagnosis not present

## 2024-01-31 DIAGNOSIS — Z4659 Encounter for fitting and adjustment of other gastrointestinal appliance and device: Secondary | ICD-10-CM | POA: Diagnosis not present

## 2024-01-31 DIAGNOSIS — E669 Obesity, unspecified: Secondary | ICD-10-CM | POA: Diagnosis not present

## 2024-01-31 DIAGNOSIS — T859XXA Unspecified complication of internal prosthetic device, implant and graft, initial encounter: Secondary | ICD-10-CM | POA: Insufficient documentation

## 2024-01-31 DIAGNOSIS — I5022 Chronic systolic (congestive) heart failure: Secondary | ICD-10-CM | POA: Insufficient documentation

## 2024-01-31 DIAGNOSIS — Z4682 Encounter for fitting and adjustment of non-vascular catheter: Secondary | ICD-10-CM | POA: Diagnosis not present

## 2024-01-31 DIAGNOSIS — K8001 Calculus of gallbladder with acute cholecystitis with obstruction: Secondary | ICD-10-CM | POA: Diagnosis not present

## 2024-01-31 DIAGNOSIS — M1990 Unspecified osteoarthritis, unspecified site: Secondary | ICD-10-CM | POA: Diagnosis not present

## 2024-01-31 DIAGNOSIS — Z9889 Other specified postprocedural states: Secondary | ICD-10-CM

## 2024-01-31 DIAGNOSIS — E119 Type 2 diabetes mellitus without complications: Secondary | ICD-10-CM | POA: Diagnosis not present

## 2024-01-31 DIAGNOSIS — K2970 Gastritis, unspecified, without bleeding: Secondary | ICD-10-CM

## 2024-01-31 DIAGNOSIS — K831 Obstruction of bile duct: Secondary | ICD-10-CM | POA: Diagnosis present

## 2024-01-31 HISTORY — PX: ERCP: SHX5425

## 2024-01-31 HISTORY — PX: SPYGLASS CHOLANGIOSCOPY: SHX5441

## 2024-01-31 LAB — HEPATIC FUNCTION PANEL
ALT: 25 U/L (ref 0–44)
AST: 32 U/L (ref 15–41)
Albumin: 3.6 g/dL (ref 3.5–5.0)
Alkaline Phosphatase: 55 U/L (ref 38–126)
Bilirubin, Direct: 0.5 mg/dL — ABNORMAL HIGH (ref 0.0–0.2)
Indirect Bilirubin: 1.8 mg/dL — ABNORMAL HIGH (ref 0.3–0.9)
Total Bilirubin: 2.3 mg/dL — ABNORMAL HIGH (ref 0.0–1.2)
Total Protein: 6.3 g/dL — ABNORMAL LOW (ref 6.5–8.1)

## 2024-01-31 SURGERY — ERCP, WITH INTERVENTION IF INDICATED
Anesthesia: General

## 2024-01-31 MED ORDER — ONDANSETRON HCL 4 MG/2ML IJ SOLN
INTRAMUSCULAR | Status: DC | PRN
  Administered 2024-01-31: 4 mg via INTRAVENOUS

## 2024-01-31 MED ORDER — SODIUM CHLORIDE 0.9 % IV SOLN
INTRAVENOUS | Status: DC | PRN
  Administered 2024-01-31: 25 mL

## 2024-01-31 MED ORDER — SODIUM CHLORIDE 0.9 % IV SOLN: INTRAVENOUS | Status: DC

## 2024-01-31 MED ORDER — PHENYLEPHRINE 80 MCG/ML (10ML) SYRINGE FOR IV PUSH (FOR BLOOD PRESSURE SUPPORT)
PREFILLED_SYRINGE | INTRAVENOUS | Status: DC | PRN
  Administered 2024-01-31 (×2): 160 ug via INTRAVENOUS
  Administered 2024-01-31: 120 ug via INTRAVENOUS

## 2024-01-31 MED ORDER — SUGAMMADEX SODIUM 200 MG/2ML IV SOLN
INTRAVENOUS | Status: DC | PRN
  Administered 2024-01-31: 200 mg via INTRAVENOUS

## 2024-01-31 MED ORDER — FENTANYL CITRATE (PF) 100 MCG/2ML IJ SOLN
INTRAMUSCULAR | Status: DC | PRN
  Administered 2024-01-31: 100 ug via INTRAVENOUS

## 2024-01-31 MED ORDER — CIPROFLOXACIN IN D5W 400 MG/200ML IV SOLN
INTRAVENOUS | Status: DC | PRN
  Administered 2024-01-31: 400 mg via INTRAVENOUS

## 2024-01-31 MED ORDER — ROCURONIUM BROMIDE 10 MG/ML (PF) SYRINGE
PREFILLED_SYRINGE | INTRAVENOUS | Status: DC | PRN
  Administered 2024-01-31: 10 mg via INTRAVENOUS
  Administered 2024-01-31: 50 mg via INTRAVENOUS

## 2024-01-31 MED ORDER — GLUCAGON HCL RDNA (DIAGNOSTIC) 1 MG IJ SOLR
INTRAMUSCULAR | Status: DC | PRN
  Administered 2024-01-31 (×3): .25 mg via INTRAVENOUS

## 2024-01-31 MED ORDER — CIPROFLOXACIN IN D5W 400 MG/200ML IV SOLN
INTRAVENOUS | Status: AC
  Filled 2024-01-31: qty 200

## 2024-01-31 MED ORDER — LIDOCAINE 2% (20 MG/ML) 5 ML SYRINGE
INTRAMUSCULAR | Status: DC | PRN
  Administered 2024-01-31: 100 mg via INTRAVENOUS

## 2024-01-31 MED ORDER — EPHEDRINE SULFATE-NACL 50-0.9 MG/10ML-% IV SOSY
PREFILLED_SYRINGE | INTRAVENOUS | Status: DC | PRN
  Administered 2024-01-31: 10 mg via INTRAVENOUS
  Administered 2024-01-31: 15 mg via INTRAVENOUS

## 2024-01-31 MED ORDER — MIDAZOLAM HCL 5 MG/5ML IJ SOLN
INTRAMUSCULAR | Status: DC | PRN
Start: 2024-01-31 — End: 2024-01-31
  Administered 2024-01-31: 2 mg via INTRAVENOUS

## 2024-01-31 MED ORDER — CIPROFLOXACIN HCL 500 MG PO TABS
500.0000 mg | ORAL_TABLET | Freq: Two times a day (BID) | ORAL | 0 refills | Status: AC
  Filled 2024-01-31: qty 6, 3d supply, fill #0

## 2024-01-31 MED ORDER — FENTANYL CITRATE (PF) 100 MCG/2ML IJ SOLN
INTRAMUSCULAR | Status: AC
  Filled 2024-01-31: qty 2

## 2024-01-31 MED ORDER — DICLOFENAC SUPPOSITORY 100 MG
RECTAL | Status: AC
  Filled 2024-01-31: qty 1

## 2024-01-31 MED ORDER — PROPOFOL 10 MG/ML IV BOLUS
INTRAVENOUS | Status: DC | PRN
  Administered 2024-01-31: 150 mg via INTRAVENOUS

## 2024-01-31 MED ORDER — DICLOFENAC SUPPOSITORY 100 MG
RECTAL | Status: DC | PRN
  Administered 2024-01-31: 100 mg via RECTAL

## 2024-01-31 MED ORDER — LACTATED RINGERS IV SOLN: INTRAVENOUS | Status: DC | PRN

## 2024-01-31 MED ORDER — MIDAZOLAM HCL 2 MG/2ML IJ SOLN
INTRAMUSCULAR | Status: AC
  Filled 2024-01-31: qty 2

## 2024-01-31 MED ORDER — GLUCAGON HCL RDNA (DIAGNOSTIC) 1 MG IJ SOLR
INTRAMUSCULAR | Status: AC
  Filled 2024-01-31: qty 1

## 2024-01-31 MED ORDER — DEXAMETHASONE SODIUM PHOSPHATE 10 MG/ML IJ SOLN
INTRAMUSCULAR | Status: DC | PRN
  Administered 2024-01-31: 10 mg via INTRAVENOUS

## 2024-01-31 NOTE — Anesthesia Postprocedure Evaluation (Signed)
 Anesthesia Post Note  Patient: William D Lucken Jr.  Procedure(s) Performed: ERCP, WITH INTERVENTION IF INDICATED CHOLANGIOSCOPY, USING SPYGLASS     Patient location during evaluation: PACU Anesthesia Type: General Level of consciousness: awake and alert Pain management: pain level controlled Vital Signs Assessment: post-procedure vital signs reviewed and stable Respiratory status: spontaneous breathing, nonlabored ventilation and respiratory function stable Cardiovascular status: blood pressure returned to baseline and stable Postop Assessment: no apparent nausea or vomiting Anesthetic complications: no   No notable events documented.  Last Vitals:  Vitals:   01/31/24 1240 01/31/24 1250  BP: 122/79 (!) 120/59  Pulse: (!) 47 (!) 42  Resp: (!) 21 20  Temp:    SpO2: 94% 94%    Last Pain:  Vitals:   01/31/24 1250  TempSrc:   PainSc: 0-No pain                 Earvin Goldberg

## 2024-01-31 NOTE — Op Note (Addendum)
 [facility] Patient Name: [name] Procedure Date: 01/31/2024 MRN: [id number] Attending MD: Yong Henle , MD, [id number] Date of Birth: Feb 23, 1961 CSN: [id number] Age: 63 Admit Type: Outpatient Procedure:                ERCP Indications:              Abnormal MRCP, Evaluation and possible treatment of                            bile duct stone(s), Biliary stent removal, Prior                            Mirrizzi s/p stenting of cystic duct stone (post                            surgical removal) Providers:                Yong Henle, MD, Marden Shaggy, RN, Marvelyn Slim, Technician Referring MD:             Yong Henle, MD Medicines:                General Anesthesia, Cipro 400 mg IV, Diclofenac  100                            mg rectal, Glucagon  1 mg IV Complications:            No immediate complications. Estimated Blood Loss:     Estimated blood loss was minimal. Procedure:                Pre-Anesthesia Assessment:                           - Prior to the procedure, a History and Physical                            was performed, and patient medications and                            allergies were reviewed. The patient's tolerance of                            previous anesthesia was also reviewed. The risks                            and benefits of the procedure and the sedation                            options and risks were discussed with the patient.                            All questions were answered, and informed consent  was obtained. Prior Anticoagulants: The patient has                            taken Xarelto  (rivaroxaban ), last dose was 2 days                            prior to procedure. ASA Grade Assessment: III - A                            patient with severe systemic disease. After                            reviewing the risks and benefits, the patient was                             deemed in satisfactory condition to undergo the                            procedure.                           After obtaining informed consent, the scope was                            passed under direct vision. Throughout the                            procedure, the patient's blood pressure, pulse, and                            oxygen  saturations were monitored continuously. The                            [employer] D single use                            duodenoscope was introduced through the mouth, and                            used to inject contrast into and used to inject                            contrast into the bile duct. The [id number]-Q190V                            ([id number]) Olympus duodenoscope was introduced                            through the mouth, and used to inject contrast into                            and used for direct visualization of the bile duct.  The ERCP was somewhat difficult due to inadequate                            patient positioning. Successful completion of the                            procedure was aided by performing the maneuvers                            documented (below) in this report. The patient                            tolerated the procedure. Scope In: Scope Out: Findings:      A biliary stent was visible on the scout film.      The esophagus was successfully intubated under direct vision without       detailed examination of the pharynx, larynx, and associated structures,       and upper GI tract. A biliary sphincterotomy had been performed. The       sphincterotomy appeared open. One stent which had been placed through       the major papilla into the biliary tree was no longer visible. It had       migrated into the duct.      A 0.035 inch x 260 cm straight Hydra Jagwire was passed into the biliary       tree. The short-nosed traction sphincterotome was passed  over the       guidewire and the bile duct was then deeply cannulated. Contrast was       injected. I personally interpreted the bile duct images. Ductal flow of       contrast was adequate. Image quality was adequate. Contrast extended to       the hepatic ducts. Opacification of the entire biliary tree except for       the gallbladder was successful. The lower third of the main bile duct       contained a single mild narrowing 15-20 mm in length. The main bile duct       contained one plastic biliary stent. The main bile duct contained       filling defects thought to be sludge. In an effort of trying to to       remove the biliary stent, decision was made to proceed with       sphincteroplasty, as I did not have much room to further extend the       sphincterotomy itself. Dilation of the common bile duct with a       HurriCaine 6 mm balloon dilator initially and then subsequently an       04-08-09 mm balloon (to a maximum balloon size of 10 mm) was successful.       The biliary tree was swept with a retrieval balloon. Sludge was swept       from the duct. One stone was removed. We were not successful with being       able to remove the stent further, even using the balloon pull-through       with the 12 mm retrieval balloon as well as the 6 mm and 8-10 mm CRE       balloons.      Decision made to proceed  with attempt at spyglass removal. The bile duct       was explored endoscopically using the SpyGlass direct visualization       system. The SpyScope was advanced to the middle third of the main bile       duct. Visibility with the scope was good. The main bile duct contained       one plastic biliary stent and it was significantly embedded deep into       the tissue of the bile duct. I attempted use of the spy bite forceps as       well as the spider snare to try to grasp the stent out of the embedded       position. This moved it slightly. I attempted to see if I could get a       wire  through the flange portion of the bile duct stent, but as a result       of its positioning I could not get a wire to superselective go through       the stent itself. Using a 0.035 inch x 450 cm straight Hydra Jagwire was       passed into the biliary tree alongside a different region of the biliary       stent in an effort of seeing if I could try to balloon sweep it down       further. Even further attempts at trying to pull the stent down or push       it up to bend reposition were not successful at being able to remove the       stent. I then transition to a typical forceps in an effort of trying to       under fluoroscopic guidance try to capture the distal end of the biliary       stent. At times it looks like we gained position but pulling it did not       allow it to come down further into accessible view.      The patient's hepatic function panel had returned during the procedure,       and he has evidence of abnormal LFTs, concerning for some partial       obstruction as a result of the embedded stent. Decision made to proceed       with placement of one 8.5 Fr by 5 cm plastic biliary stent with a single       external flap and a single internal flap was placed into the common bile       duct. The stent was in good position, and alongside the other biliary       stent. My hope is that the stent will stay in position for now and then       when we come back to remove it in the course of the coming weeks both       stents will remove once I try to remove the main new stent that was       placed.      A formal pancreatogram was not performed.      The duodenoscope was withdrawn from the patient. Impression:               - Prior biliary sphincterotomy appeared open.                           - A previously placed  stent had migrated into the                            biliary tree, unfortunately.                           - The fluoroscopic examination was suspicious for                             sludge.                           - Significant sludge and choledocholithiasis debris                            and small stone was extracted via balloon sweeping.                           - A single mild biliary narrowing was found in the                            lower third of the main bile duct. The stricture                            was inflammatory in appearance.                           - Sphincteroplasty performed.                           - As noted above, unsuccessful removal with typical                            ERCP techniques.                           - Decision to proceed with cholangioscopy and                            attempt at removal.                           - As noted above, unsuccessful removal with                            cholangioscopy aided ERCP techniques.                           - One plastic biliary stent was placed into the                            common bile duct (alongside the stent that is                            intraductal already). Moderate Sedation:      Not Applicable - Patient had care per Anesthesia.  Recommendation:           - The patient will be observed post-procedure,                            until all discharge criteria are met.                           - Discharge patient to home.                           - Patient has a contact number available for                            emergencies. The signs and symptoms of potential                            delayed complications were discussed with the                            patient. Return to normal activities tomorrow.                            Written discharge instructions were provided to the                            patient.                           - Low fat diet.                           - Observe patient's clinical course.                           - Check liver enzymes (AST, ALT, alkaline                            phosphatase, bilirubin)  in 1 week.                           - Repeat ERCP in 2 months to remove stent. If this                            repeat ERCP is unsuccessful, will refer to                            [facility] in effort of trying to remove                            this further before biliary duct reconstruction is                            considered.                           -  Watch for pancreatitis, bleeding, perforation,                            and cholangitis.                           - Ciprofloxacin 500 mg twice daily for 3 days to                            decrease risk of post eventual infection.                           - May restart Xarelto  on 6/4 to decreas risk of                            post-interventional bleeding.                           - The findings and recommendations were discussed                            with the patient.                           - The findings and recommendations were discussed                            with the patient's family. Procedure Code(s):        --- Professional ---                           [phone], Endoscopic retrograde                            cholangiopancreatography (ERCP); with removal and                            exchange of stent(s), biliary or pancreatic duct,                            including pre- and post-dilation and guide wire                            passage, when performed, including sphincterotomy,                            when performed, each stent exchanged                           43264, Endoscopic retrograde                            cholangiopancreatography (ERCP); with removal of                            calculi/debris from biliary/pancreatic duct(s)  30865, Endoscopic cannulation of papilla with                            direct visualization of pancreatic/common bile                            duct(s) (List separately in addition to code(s) for                             primary procedure)                           [phone], Endoscopic catheterization of the biliary                            ductal system, radiological supervision and                            interpretation Diagnosis Code(s):        --- Professional ---                           K80.51, Calculus of bile duct without cholangitis                            or cholecystitis with obstruction                           T85.520A, Displacement of bile duct prosthesis,                            initial encounter                           Z46.59, Encounter for fitting and adjustment of                            other gastrointestinal appliance and device                           R93.2, Abnormal findings on diagnostic imaging of                            liver and biliary tract CPT copyright 2022 American Medical Association. All rights reserved. The codes documented in this report are preliminary and upon coder review may  be revised to meet current compliance requirements. Yong Henle, MD 01/31/2024 12:31:53 PM Number of Addenda: 0

## 2024-01-31 NOTE — Discharge Instructions (Signed)

## 2024-01-31 NOTE — H&P (Signed)
 GASTROENTEROLOGY PROCEDURE H&P NOTE   Primary Care Physician: Driscilla George, MD  HPI: [name]. is a 63 y.o. male who presents for ERCP for attempt at cystic duct stone extraction/removal in setting of Mirrizzi's and biliary obstruction post stenting.  Past Medical History:  Diagnosis Date   Arthritis    Calculus of gallbladder with acute cholecystitis 11/23/2023   Cholangitis 11/23/2023   Cholecystitis 11/29/2023   Chronic systolic heart failure (HCC) 09/27/2019   Diabetes mellitus without complication (HCC)    TYPE 2   Diverticulosis    Dizziness 11/11/2022   He was at his baseline state of health until this Sunday, when he experienced dizziness at church. He felt "off" with blurred vision, and went to wait in the car, but was well enough to drive home. On Monday, he had a similar episode, and had to leave work early, which is very atypical. His dizziness is associated with fatigue and generally not feeling well. It comes and goes, but he hasn't identi   Dyslipidemia    Elevated bilirubin 11/23/2023   GERD (gastroesophageal reflux disease)    Herpes labialis    Hypertension    Longstanding persistent atrial fibrillation (HCC)    Metabolic syndrome    Obesity    Proteinuria    Psoriasis    S/P left TKA 06/28/2018   S/P TKR (total knee replacement), right 05/31/2018   Seborrheic dermatitis    Sleep apnea    very compliant with CPAP, PT NEEDS TO BRING OWN MACHINE   TIA (transient ischemic attack) 2009   Past Surgical History:  Procedure Laterality Date   BILIARY STENT PLACEMENT  11/25/2023   Procedure: INSERTION, STENT, BILE DUCT;  Surgeon: Kenney Peacemaker, MD;  Location: [facility] ENDOSCOPY;  Service: Gastroenterology;;   CARDIOVERSION N/A 12/02/2016   Procedure: CARDIOVERSION;  Surgeon: Hazle Lites, MD;  Location: [facility] ENDOSCOPY;  Service: Cardiovascular;  Laterality: N/A;   CHOLECYSTECTOMY N/A 11/29/2023   Procedure: LAPAROSCOPIC CHOLECYSTECTOMY WITH INTRAOPERATIVE  CHOLANGIOGRAM;  Surgeon: Lujean Sake, MD;  Location: [facility] OR;  Service: General;  Laterality: N/A;   COLONOSCOPY     ERCP N/A 11/25/2023   Procedure: ERCP, WITH INTERVENTION IF INDICATED;  Surgeon: Kenney Peacemaker, MD;  Location: [facility] ENDOSCOPY;  Service: Gastroenterology;  Laterality: N/A;  With stent placement   ESOPHAGOGASTRODUODENOSCOPY N/A 11/25/2023   Procedure: EGD (ESOPHAGOGASTRODUODENOSCOPY);  Surgeon: Kenney Peacemaker, MD;  Location: [facility] ENDOSCOPY;  Service: Gastroenterology;  Laterality: N/A;   EXCISIONAL TOTAL KNEE ARTHROPLASTY WITH ANTIBIOTIC SPACERS Right 03/28/2019   Procedure: EXCISIONAL TOTAL KNEE ARTHROPLASTY WITH ANTIBIOTIC SPACERS;  Surgeon: Claiborne Crew, MD;  Location: [facility] ORS;  Service: Orthopedics;  Laterality: Right;  90 mins   I & D KNEE WITH POLY EXCHANGE Right 09/10/2018   Procedure: Right Knee Arthroplasty IRRIGATION AND DEBRIDEMENT KNEE WITH POLY EXCHANGE;  Surgeon: Claiborne Crew, MD;  Location: [facility] ORS;  Service: Orthopedics;  Laterality: Right;   I & D KNEE WITH POLY EXCHANGE Right 06/25/2020   Procedure: Open excisional and non excisional debridement right knee, possible aspiration versus open arthrotomy poly exchange;  Surgeon: Claiborne Crew, MD;  Location: [facility] ORS;  Service: Orthopedics;  Laterality: Right;   IRRIGATION AND DEBRIDEMENT KNEE Right 07/09/2020   Procedure: IRRIGATION AND DEBRIDEMENT KNEE;  Surgeon: Claiborne Crew, MD;  Location: [facility] ORS;  Service: Orthopedics;  Laterality: Right;   JOINT REPLACEMENT  11/03/10   LT HIP   PFO occluder cardiac valve  2006   Dr. Berry Bristol, (hole in  heart)   REIMPLANTATION OF TOTAL KNEE Right 06/15/2019   Procedure: Resection of antibiotic spacer and  irrigation and debridment and placement of new antibiotic spacer and components;  Surgeon: Claiborne Crew, MD;  Location: [facility] ORS;  Service: Orthopedics;  Laterality: Right;  90 mins   REIMPLANTATION OF TOTAL KNEE Right 08/22/2019   Procedure: REIMPLANTATION OF TOTAL KNEE;  Surgeon: Claiborne Crew, MD;  Location: [facility] ORS;  Service: Orthopedics;  Laterality: Right;  120 mins   SKIN BIOPSY Left 12/22/2019   epidermal inclusion cyst   TONSILLECTOMY AND ADENOIDECTOMY     TOTAL HIP ARTHROPLASTY Left    TOTAL KNEE ARTHROPLASTY Right 05/31/2018   Procedure: RIGHT TOTAL KNEE ARTHROPLASTY;  Surgeon: Claiborne Crew, MD;  Location: [facility] ORS;  Service: Orthopedics;  Laterality: Right;  70 mins   TOTAL KNEE ARTHROPLASTY Left 06/28/2018   Procedure: LEFT TOTAL KNEE ARTHROPLASTY;  Surgeon: Claiborne Crew, MD;  Location: [facility] ORS;  Service: Orthopedics;  Laterality: Left;  70 mins   WISDOM TOOTH EXTRACTION     No current facility-administered medications for this encounter.   No current facility-administered medications for this encounter. Allergies  Allergen Reactions   Morphine  Other (See Comments)   Family History  Problem Relation Age of Onset   Uterine cancer Mother 27       Cervical cath   Crohn's disease Mother    Esophageal cancer Father    Cancer Other    Obesity Other    Social History   Socioeconomic History   Marital status: Married    Spouse name: Not on file   Number of children: 2   Years of education: Not on file   Highest education level: Bachelor's degree (e.g., BA, AB, BS)  Occupational History   Occupation: Information systems manager: [employer]  Tobacco Use   Smoking status: Never   Smokeless tobacco: Never  Vaping Use   Vaping status: Never Used  Substance and Sexual Activity   Alcohol  use: Yes    Comment: rarely   Drug use: No   Sexual activity: Yes  Other Topics Concern   Not on file  Social History Narrative   Lives with spouse in [place]   Wife works for [employer] teaching program   Owns Social worker   Social Drivers of Health   Financial Resource Strain: Low Risk  (12/09/2021)   Overall Financial Resource Strain (CARDIA)    Difficulty of Paying Living Expenses: Not hard at all  Food Insecurity: Patient Declined  (11/29/2023)   Hunger Vital Sign    Worried About Running Out of Food in the Last Year: Patient declined    Ran Out of Food in the Last Year: Patient declined  Transportation Needs: No Transportation Needs (11/29/2023)   PRAPARE - Administrator, Civil Service (Medical): No    Lack of Transportation (Non-Medical): No  Physical Activity: Insufficiently Active (12/09/2021)   Exercise Vital Sign    Days of Exercise per Week: 3 days    Minutes of Exercise per Session: 30 min  Stress: Stress Concern Present (12/09/2021)   [employer] of Occupational Health - Occupational Stress Questionnaire    Feeling of Stress : To some extent  Social Connections: Socially Integrated (12/09/2021)   Social Connection and Isolation Panel [NHANES]    Frequency of Communication with Friends and Family: More than three times a week    Frequency of Social Gatherings with Friends and Family: More than three times a week  Attends Religious Services: More than 4 times per year    Active Member of Clubs or Organizations: Yes    Attends Banker Meetings: More than 4 times per year    Marital Status: Married  Catering manager Violence: Not At Risk (11/29/2023)   Humiliation, Afraid, Rape, and Kick questionnaire    Fear of Current or Ex-Partner: No    Emotionally Abused: No    Physically Abused: No    Sexually Abused: No    Physical Exam: There were no vitals filed for this visit. There is no height or weight on file to calculate BMI. GEN: NAD EYE: Sclerae anicteric ENT: MMM CV: Non-tachycardic GI: Soft, NT/ND NEURO:  Alert & Oriented x 3  Lab Results: No results for input(s): "WBC", "HGB", "HCT", "PLT" in the last 72 hours. BMET No results for input(s): "NA", "K", "CL", "CO2", "GLUCOSE", "BUN", "CREATININE", "CALCIUM " in the last 72 hours. LFT No results for input(s): "PROT", "ALBUMIN ", "AST", "ALT", "ALKPHOS", "BILITOT", "BILIDIR", "IBILI" in the last 72 hours. PT/INR No  results for input(s): "LABPROT", "INR" in the last 72 hours.   Impression / Plan: This is a 63 y.o.male who presents for ERCP for attempt at cystic duct stone extraction/removal in setting of Mirrizzi's and biliary obstruction post stenting.  The risks of an ERCP were discussed at length, including but not limited to the risk of perforation, bleeding, abdominal pain, post-ERCP pancreatitis (while usually mild can be severe and even life threatening).   The risks and benefits of endoscopic evaluation/treatment were discussed with the patient and/or family; these include but are not limited to the risk of perforation, infection, bleeding, missed lesions, lack of diagnosis, severe illness requiring hospitalization, as well as anesthesia and sedation related illnesses.  The patient's history has been reviewed, patient examined, no change in status, and deemed stable for procedure.  The patient and/or family is agreeable to proceed.    Yong Henle, MD [facility] Advanced [facility]oscopy Office # [phone]

## 2024-01-31 NOTE — Anesthesia Procedure Notes (Signed)
 Procedure Name: Intubation Date/Time: 01/31/2024 10:05 AM  Performed by: Micky Albee, CRNAPre-anesthesia Checklist: Patient identified, Suction available, Emergency Drugs available, Patient being monitored and Timeout performed Patient Re-evaluated:Patient Re-evaluated prior to induction Oxygen  Delivery Method: Circle system utilized Preoxygenation: Pre-oxygenation with 100% oxygen  Induction Type: IV induction Ventilation: Mask ventilation without difficulty Laryngoscope Size: Mac and 4 Grade View: Grade II Tube type: Oral Tube size: 7.5 mm Number of attempts: 1 Airway Equipment and Method: Stylet Placement Confirmation: ETT inserted through vocal cords under direct vision, positive ETCO2 and breath sounds checked- equal and bilateral Secured at: 23 cm Tube secured with: Tape Dental Injury: Teeth and Oropharynx as per pre-operative assessment

## 2024-01-31 NOTE — Anesthesia Preprocedure Evaluation (Signed)
 Anesthesia Evaluation  Patient identified by MRN, date of birth, ID band Patient awake    Reviewed: Allergy & Precautions, NPO status , Patient's Chart, lab work & pertinent test results, reviewed documented beta blocker date and time   Airway Mallampati: III  TM Distance: >3 FB     Dental no notable dental hx.    Pulmonary sleep apnea    breath sounds clear to auscultation       Cardiovascular hypertension, Pt. on medications +CHF (preserved EF)  (-) CAD, (-) Past MI and (-) Cardiac Stents + dysrhythmias Atrial Fibrillation  Rhythm:Irregular Rate:Normal     Neuro/Psych TIA   GI/Hepatic ,GERD  ,,(+) Cirrhosis       cholecystitis   Endo/Other  diabetes, Type 2    Renal/GU Renal disease     Musculoskeletal  (+) Arthritis ,    Abdominal   Peds  Hematology   Anesthesia Other Findings   Reproductive/Obstetrics                             Anesthesia Physical Anesthesia Plan  ASA: 3  Anesthesia Plan: General   Post-op Pain Management:    Induction: Intravenous  PONV Risk Score and Plan: 2 and Ondansetron , Midazolam  and Treatment may vary due to age or medical condition  Airway Management Planned: Oral ETT  Additional Equipment:   Intra-op Plan:   Post-operative Plan: Extubation in OR  Informed Consent: I have reviewed the patients History and Physical, chart, labs and discussed the procedure including the risks, benefits and alternatives for the proposed anesthesia with the patient or authorized representative who has indicated his/her understanding and acceptance.     Dental advisory given  Plan Discussed with: CRNA  Anesthesia Plan Comments:         Anesthesia Quick Evaluation

## 2024-01-31 NOTE — Transfer of Care (Signed)
 Immediate Anesthesia Transfer of Care Note  Patient: [name].  Procedure(s) Performed: ERCP, WITH INTERVENTION IF INDICATED  Patient Location: PACU  Anesthesia Type:General  Level of Consciousness: sedated  Airway & Oxygen  Therapy: Patient Spontanous Breathing and Patient connected to face mask oxygen   Post-op Assessment: Report given to RN and Post -op Vital signs reviewed and stable  Post vital signs: Reviewed and stable  Last Vitals:  Vitals Value Taken Time  BP 111/76 01/31/24 1214  Temp    Pulse 61 01/31/24 1217  Resp 19 01/31/24 1217  SpO2 99 % 01/31/24 1217  Vitals shown include unfiled device data.  Last Pain:  Vitals:   01/31/24 0844  TempSrc: Temporal  PainSc: 4          Complications: No notable events documented.

## 2024-02-01 ENCOUNTER — Other Ambulatory Visit: Payer: Self-pay

## 2024-02-01 ENCOUNTER — Telehealth: Payer: Self-pay

## 2024-02-01 ENCOUNTER — Other Ambulatory Visit (HOSPITAL_COMMUNITY): Payer: Self-pay

## 2024-02-01 ENCOUNTER — Other Ambulatory Visit: Payer: Self-pay | Admitting: Cardiovascular Disease

## 2024-02-01 DIAGNOSIS — K8001 Calculus of gallbladder with acute cholecystitis with obstruction: Secondary | ICD-10-CM

## 2024-02-01 DIAGNOSIS — K8050 Calculus of bile duct without cholangitis or cholecystitis without obstruction: Secondary | ICD-10-CM

## 2024-02-01 DIAGNOSIS — I4821 Permanent atrial fibrillation: Secondary | ICD-10-CM

## 2024-02-01 MED ORDER — RIVAROXABAN 20 MG PO TABS
20.0000 mg | ORAL_TABLET | Freq: Every day | ORAL | 1 refills | Status: DC
  Filled 2024-02-01: qty 90, 90d supply, fill #0
  Filled 2024-05-03: qty 90, 90d supply, fill #1

## 2024-02-01 NOTE — Telephone Encounter (Signed)
 Recall ERCP entered  Recall colon entered  Lab order entered  Follow up appt made on 04/11/24 at 930 am with GM   Left message on machine to call back

## 2024-02-01 NOTE — Telephone Encounter (Signed)
 The patient has been notified of this information and all questions answered.   He will come in for labs in a few weeks  He is aware we will call him for colon and ERCP when due.  Recalls entered   He will keep appt for office visit as scheduled.

## 2024-02-01 NOTE — Telephone Encounter (Signed)
-----   Message from [facility] sent at 01/31/2024  4:29 PM EDT ----- Regarding: Follow-up CBD stent and cirrhosis Austyn Seier, This patient needs a follow-up in clinic with APP or myself.  Please arrange that in 4 to 8 weeks. Patient needs CBC/CMP/INR/Hepatitis A total antibody, to be drawn before the clinic visit (preferentially in the next 2 weeks). Patient needs repeat ERCP in 2 to 3 months for repeat attempt at removal of biliary stent. Patient needs colonoscopy recall under my name in 4 months (history adenomatous colon polyps). Thanks. GM

## 2024-02-01 NOTE — Telephone Encounter (Signed)
 Prescription refill request for Xarelto  received.  Indication:AFIB Last office visit:7/24 Weight:108.4  kg Age:63 Scr:0.89 5/25 CrCl:128.56  ml/min  Prescription refilled

## 2024-02-02 ENCOUNTER — Other Ambulatory Visit (HOSPITAL_COMMUNITY): Payer: Self-pay

## 2024-02-02 ENCOUNTER — Other Ambulatory Visit: Payer: Self-pay

## 2024-02-02 ENCOUNTER — Encounter (HOSPITAL_COMMUNITY): Payer: Self-pay | Admitting: Gastroenterology

## 2024-02-02 MED ORDER — CARVEDILOL 12.5 MG PO TABS
12.5000 mg | ORAL_TABLET | Freq: Two times a day (BID) | ORAL | 3 refills | Status: AC
  Filled 2024-02-02: qty 180, 90d supply, fill #0
  Filled 2024-04-27: qty 180, 90d supply, fill #1
  Filled 2024-07-26: qty 180, 90d supply, fill #2

## 2024-02-02 NOTE — Telephone Encounter (Signed)
 Medication sent to pharmacy

## 2024-02-08 ENCOUNTER — Other Ambulatory Visit: Payer: Self-pay | Admitting: Medical Oncology

## 2024-02-08 DIAGNOSIS — D45 Polycythemia vera: Secondary | ICD-10-CM

## 2024-02-09 ENCOUNTER — Inpatient Hospital Stay

## 2024-02-09 ENCOUNTER — Inpatient Hospital Stay: Attending: Internal Medicine

## 2024-02-09 ENCOUNTER — Inpatient Hospital Stay: Admitting: Internal Medicine

## 2024-02-09 VITALS — BP 117/61 | HR 81 | Temp 97.6°F | Resp 18 | Ht 70.0 in | Wt 245.1 lb

## 2024-02-09 DIAGNOSIS — Z79899 Other long term (current) drug therapy: Secondary | ICD-10-CM | POA: Diagnosis not present

## 2024-02-09 DIAGNOSIS — D45 Polycythemia vera: Secondary | ICD-10-CM

## 2024-02-09 DIAGNOSIS — D751 Secondary polycythemia: Secondary | ICD-10-CM | POA: Diagnosis not present

## 2024-02-09 LAB — CBC WITH DIFFERENTIAL (CANCER CENTER ONLY)
Abs Immature Granulocytes: 0.05 10*3/uL (ref 0.00–0.07)
Basophils Absolute: 0 10*3/uL (ref 0.0–0.1)
Basophils Relative: 1 %
Eosinophils Absolute: 0.1 10*3/uL (ref 0.0–0.5)
Eosinophils Relative: 2 %
HCT: 48.1 % (ref 39.0–52.0)
Hemoglobin: 17.2 g/dL — ABNORMAL HIGH (ref 13.0–17.0)
Immature Granulocytes: 1 %
Lymphocytes Relative: 25 %
Lymphs Abs: 1.6 10*3/uL (ref 0.7–4.0)
MCH: 29.5 pg (ref 26.0–34.0)
MCHC: 35.8 g/dL (ref 30.0–36.0)
MCV: 82.4 fL (ref 80.0–100.0)
Monocytes Absolute: 0.6 10*3/uL (ref 0.1–1.0)
Monocytes Relative: 10 %
Neutro Abs: 4 10*3/uL (ref 1.7–7.7)
Neutrophils Relative %: 61 %
Platelet Count: 149 10*3/uL — ABNORMAL LOW (ref 150–400)
RBC: 5.84 MIL/uL — ABNORMAL HIGH (ref 4.22–5.81)
RDW: 15.4 % (ref 11.5–15.5)
WBC Count: 6.5 10*3/uL (ref 4.0–10.5)
nRBC: 0 % (ref 0.0–0.2)

## 2024-02-09 NOTE — Progress Notes (Signed)
 [facility] [phone]   [phone]  OFFICE PROGRESS NOTE  Driscilla George, MD [address] 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 Negative MPL515 mutation, Negative BCR/ABL and negative JAK-2 exon 12.  PRIOR THERAPY: None  CURRENT THERAPY: Phlebotomy on as-needed basis.  INTERVAL HISTORY: [name]. 63 y.o. male returns to the clinic today for follow-up visit.Discussed the use of AI scribe software for clinical note transcription with the patient, who gave verbal consent to proceed.  History of Present Illness   [name] is a 63 year old male who presents with persistent polycythemia for evaluation and possible phlebotomy.  He has persistent polycythemia with a hemoglobin level of 17.2 and hematocrit of 48.1. The JAK2 mutation test was negative. He does not experience symptoms such as headache, dizziness, or visual disturbances.  Approximately seven weeks ago, he was hospitalized due to severe symptoms including jaundice. Imaging revealed gallstones, with one obstructing the bile duct. He underwent a stent placement and later a cholecystectomy. A subsequent endoscopy to remove the stent was unsuccessful, necessitating an additional stent placement. He is scheduled for another procedure in eight weeks.  He has a history of knee issues, having undergone seven knee replacements on the right knee due to severe infections. He is on permanent antibiotics, specifically Cefalexin, to manage these infections. He has experienced multiple hematomas, one recently after yard work, which was black and extended from his wrist to his elbow.  He has liver concerns, including cirrhosis potentially related to fatty liver disease. He is currently on Mounjaro  and has noted weight loss, partly attributed to a clear liquid diet during his  hospital stay. He has reduced his intake of sweetened tea significantly, now drinking mostly water .  His social history includes a high activity level despite his medical issues, with past activities including football, tennis, and working with livestock, which have contributed to his knee problems.        MEDICAL HISTORY: Past Medical History:  Diagnosis Date   Arthritis    Calculus of gallbladder with acute cholecystitis 11/23/2023   Cholangitis 11/23/2023   Cholecystitis 11/29/2023   Chronic systolic heart failure (HCC) 09/27/2019   Diabetes mellitus without complication (HCC)    TYPE 2   Diverticulosis    Dizziness 11/11/2022   He was at his baseline state of health until this Sunday, when he experienced dizziness at church. He felt off with blurred vision, and went to wait in the car, but was well enough to drive home. On Monday, he had a similar episode, and had to leave work early, which is very atypical. His dizziness is associated with fatigue and generally not feeling well. It comes and goes, but he hasn't identi   Dyslipidemia    Elevated bilirubin 11/23/2023   GERD (gastroesophageal reflux disease)    Herpes labialis    Hypertension    Longstanding persistent atrial fibrillation (HCC)    Metabolic syndrome    Obesity    Proteinuria    Psoriasis    S/P left TKA 06/28/2018   S/P TKR (total knee replacement), right 05/31/2018   Seborrheic dermatitis    Sleep apnea    very compliant with CPAP, PT NEEDS TO BRING OWN MACHINE   TIA (transient ischemic attack) 2009    ALLERGIES:  is allergic to morphine .  MEDICATIONS:  Current Outpatient Medications  Medication Sig  Dispense Refill   acetaminophen  (TYLENOL ) 500 MG tablet Take 500 mg by mouth daily as needed for mild pain (pain score 1-3) or moderate pain (pain score 4-6).     b complex vitamins capsule Take 1 capsule by mouth daily.     carvedilol  (COREG ) 12.5 MG tablet Take 1 tablet (12.5 mg total) by mouth 2 (two)  times daily. 180 tablet 3   cefadroxil  (DURICEF) 500 MG capsule Take 1 capsule (500 mg total) by mouth 2 (two) times daily. 60 capsule 11   empagliflozin  (JARDIANCE ) 10 MG TABS tablet Take 1 tablet (10 mg total) by mouth daily. 90 tablet 1   methocarbamol  (ROBAXIN ) 500 MG tablet Take 1 tablet (500 mg total) by mouth every 6 (six) hours as needed for muscle spasms. 20 tablet 0   Multiple Vitamin (MULTIVITAMIN WITH MINERALS) TABS tablet Take 1 tablet by mouth daily.     mupirocin  ointment (BACTROBAN ) 2 % Apply to affected area twice a day. 22 g 0   Oxymetazoline  HCl (NASAL SPRAY) 0.05 % SOLN Place 1 spray into the nose at bedtime as needed (congestion).     pantoprazole  (PROTONIX ) 40 MG tablet Take 1 tablet (40 mg total) by mouth daily. 180 tablet 0   rivaroxaban  (XARELTO ) 20 MG TABS tablet Take 1 tablet (20 mg total) by mouth daily with supper. 90 tablet 1   rosuvastatin  (CRESTOR ) 10 MG tablet Take 1 tablet (10 mg total) by mouth daily. 90 tablet 1   sacubitril -valsartan  (ENTRESTO ) 24-26 MG Take 1 tablet by mouth 2 (two) times daily. 180 tablet 1   TALTZ  80 MG/ML pen Inject 1 ml (80mg ) under the skin every 4 weeks. 1 mL 11   tirzepatide  (MOUNJARO ) 10 MG/0.5ML Pen Inject 10 mg into the skin once a week. 2 mL 3   Vitamin D , Cholecalciferol , 1000 units CAPS Take 1,000 Units by mouth daily.      No current facility-administered medications for this visit.    SURGICAL HISTORY:  Past Surgical History:  Procedure Laterality Date   BILIARY STENT PLACEMENT  11/25/2023   Procedure: INSERTION, STENT, BILE DUCT;  Surgeon: Kenney Peacemaker, MD;  Location: [facility] ENDOSCOPY;  Service: Gastroenterology;;   CARDIOVERSION N/A 12/02/2016   Procedure: CARDIOVERSION;  Surgeon: Hazle Lites, MD;  Location: [facility] ENDOSCOPY;  Service: Cardiovascular;  Laterality: N/A;   CHOLECYSTECTOMY N/A 11/29/2023   Procedure: LAPAROSCOPIC CHOLECYSTECTOMY WITH INTRAOPERATIVE CHOLANGIOGRAM;  Surgeon: Lujean Sake, MD;  Location: [facility] OR;   Service: General;  Laterality: N/A;   COLONOSCOPY     ERCP N/A 11/25/2023   Procedure: ERCP, WITH INTERVENTION IF INDICATED;  Surgeon: Kenney Peacemaker, MD;  Location: [facility] ENDOSCOPY;  Service: Gastroenterology;  Laterality: N/A;  With stent placement   ERCP N/A 01/31/2024   Procedure: ERCP, WITH INTERVENTION IF INDICATED;  Surgeon: Brice Campi Albino Alu., MD;  Location: [facility] ENDOSCOPY;  Service: Gastroenterology;  Laterality: N/A;   ESOPHAGOGASTRODUODENOSCOPY N/A 11/25/2023   Procedure: EGD (ESOPHAGOGASTRODUODENOSCOPY);  Surgeon: Kenney Peacemaker, MD;  Location: [facility] ENDOSCOPY;  Service: Gastroenterology;  Laterality: N/A;   EXCISIONAL TOTAL KNEE ARTHROPLASTY WITH ANTIBIOTIC SPACERS Right 03/28/2019   Procedure: EXCISIONAL TOTAL KNEE ARTHROPLASTY WITH ANTIBIOTIC SPACERS;  Surgeon: Claiborne Crew, MD;  Location: [facility] ORS;  Service: Orthopedics;  Laterality: Right;  90 mins   I & D KNEE WITH POLY EXCHANGE Right 09/10/2018   Procedure: Right Knee Arthroplasty IRRIGATION AND DEBRIDEMENT KNEE WITH POLY EXCHANGE;  Surgeon: Claiborne Crew, MD;  Location: [facility] ORS;  Service: Orthopedics;  Laterality:  Right;   I & D KNEE WITH POLY EXCHANGE Right 06/25/2020   Procedure: Open excisional and non excisional debridement right knee, possible aspiration versus open arthrotomy poly exchange;  Surgeon: Claiborne Crew, MD;  Location: [facility] ORS;  Service: Orthopedics;  Laterality: Right;   IRRIGATION AND DEBRIDEMENT KNEE Right 07/09/2020   Procedure: IRRIGATION AND DEBRIDEMENT KNEE;  Surgeon: Claiborne Crew, MD;  Location: [facility] ORS;  Service: Orthopedics;  Laterality: Right;   JOINT REPLACEMENT  11/03/10   LT HIP   PFO occluder cardiac valve  2006   Dr. Berry [place], (hole in heart)   REIMPLANTATION OF TOTAL KNEE Right 06/15/2019   Procedure: Resection of antibiotic spacer and  irrigation and debridment and placement of new antibiotic spacer and components;  Surgeon: Claiborne Crew, MD;  Location: [facility] ORS;  Service: Orthopedics;  Laterality: Right;   90 mins   REIMPLANTATION OF TOTAL KNEE Right 08/22/2019   Procedure: REIMPLANTATION OF TOTAL KNEE;  Surgeon: Claiborne Crew, MD;  Location: [facility] ORS;  Service: Orthopedics;  Laterality: Right;  120 mins   SKIN BIOPSY Left 12/22/2019   epidermal inclusion cyst   SPYGLASS CHOLANGIOSCOPY N/A 01/31/2024   Procedure: CHOLANGIOSCOPY, USING SPYGLASS;  Surgeon: Brice Campi Albino Alu., MD;  Location: [facility] ENDOSCOPY;  Service: Gastroenterology;  Laterality: N/A;   TONSILLECTOMY AND ADENOIDECTOMY     TOTAL HIP ARTHROPLASTY Left    TOTAL KNEE ARTHROPLASTY Right 05/31/2018   Procedure: RIGHT TOTAL KNEE ARTHROPLASTY;  Surgeon: Claiborne Crew, MD;  Location: [facility] ORS;  Service: Orthopedics;  Laterality: Right;  70 mins   TOTAL KNEE ARTHROPLASTY Left 06/28/2018   Procedure: LEFT TOTAL KNEE ARTHROPLASTY;  Surgeon: Claiborne Crew, MD;  Location: [facility] ORS;  Service: Orthopedics;  Laterality: Left;  70 mins   WISDOM TOOTH EXTRACTION      REVIEW OF SYSTEMS:  Constitutional: positive for fatigue Eyes: negative Ears, nose, mouth, throat, and face: negative Respiratory: negative Cardiovascular: negative Gastrointestinal: negative Genitourinary:negative Integument/breast: negative Hematologic/lymphatic: negative Musculoskeletal:negative Neurological: negative Behavioral/Psych: negative Endocrine: negative Allergic/Immunologic: negative   PHYSICAL EXAMINATION: General appearance: alert, cooperative, fatigued, and no distress Head: Normocephalic, without obvious abnormality, atraumatic Neck: no adenopathy, no JVD, supple, symmetrical, trachea midline, and thyroid  not enlarged, symmetric, no tenderness/mass/nodules Lymph nodes: Cervical, supraclavicular, and axillary nodes normal. Resp: clear to auscultation bilaterally Back: symmetric, no curvature. ROM normal. No CVA tenderness. Cardio: regular rate and rhythm, S1, S2 normal, no murmur, click, rub or gallop GI: soft, non-tender; bowel sounds normal; no masses,  no  organomegaly Extremities: extremities normal, atraumatic, no cyanosis or edema Neurologic: Alert and oriented X 3, normal strength and tone. Normal symmetric reflexes. Normal coordination and gait  ECOG PERFORMANCE STATUS: 1 - Symptomatic but completely ambulatory  Blood pressure 117/61, pulse 81, temperature 97.6 F (36.4 C), temperature source Tympanic, resp. rate 18, height 5' 10 (1.778 m), weight 245 lb 1.6 oz (111.2 kg), SpO2 98%.  LABORATORY DATA: Lab Results  Component Value Date   WBC 6.5 02/09/2024   HGB 17.2 (H) 02/09/2024   HCT 48.1 02/09/2024   MCV 82.4 02/09/2024   PLT 149 (L) 02/09/2024      Chemistry      Component Value Date/Time   NA 133 (L) 01/10/2024 1053   NA 138 05/22/2015 1303   K 4.6 01/10/2024 1053   K 4.1 05/22/2015 1303   CL 97 01/10/2024 1053   CO2 20 01/10/2024 1053   CO2 23 05/22/2015 1303   BUN 11 01/10/2024 1053   BUN 17.4 05/22/2015 1303   CREATININE  0.89 01/10/2024 1053   CREATININE 1.06 12/06/2023 0923   CREATININE 1.1 05/22/2015 1303      Component Value Date/Time   CALCIUM  9.4 01/10/2024 1053   CALCIUM  9.4 05/22/2015 1303   ALKPHOS 55 01/31/2024 0853   ALKPHOS 73 05/22/2015 1303   AST 32 01/31/2024 0853   AST 23 04/14/2022 1320   AST 24 05/22/2015 1303   ALT 25 01/31/2024 0853   ALT 21 04/14/2022 1320   ALT 30 05/22/2015 1303   BILITOT 2.3 (H) 01/31/2024 0853   BILITOT 1.2 04/14/2022 1320   BILITOT 0.77 05/22/2015 1303       RADIOGRAPHIC STUDIES: DG ERCP Result Date: 01/31/2024 CLINICAL DATA:  Planned stent removal from the common bile duct. No abnormal MRCP. EXAM: ERCP 14 intra procedural fluoroscopic images of the right upper quadrant TECHNIQUE: Multiple spot images obtained with the fluoroscopic device and submitted for interpretation post-procedure. FLUOROSCOPY: Radiation Exposure Index (as provided by the fluoroscopic device): 274.54 mGy Kerma COMPARISON:  None Available. FINDINGS: Fourteen oblique fluoroscopic images the  right upper quadrant submitted for intra procedural interpretation. Initial image demonstrates endoscope with indwelling plastic biliary stent. Guidewire placed in the common bile duct and contrast filling demonstrates multiple filling defects consistent with choledocholithiasis. Balloon sweep of the common bile duct performed. Final image demonstrates plastic biliary stent without any fluoroscopic images demonstrating retrieval. IMPRESSION: Intra procedural ERCP with balloon sweep of the common bile duct for choledochal lithiasis. Initial imaging and final imaging demonstrated plastic biliary stent indwelling. These images were submitted for radiologic interpretation only. Please see the procedural report for the amount of contrast and the fluoroscopy time utilized. Electronically Signed   By: Susan Ensign   On: 01/31/2024 13:11    ASSESSMENT AND PLAN: This is a very pleasant 63 years old white male with polycythemia highly suspicious for polycythemia vera but with negative JAK mutation.   He has been receiving phlebotomy on as-needed basis to keep his hematocrit between 45-50%. 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 Hemoglobin is 17.2 and hematocrit is 48.1, not severe enough for phlebotomy. Previous frequent blood draws during hospitalization may have influenced current hematocrit levels. Phlebotomy considered if hematocrit reaches 55 or higher. - Monitor hematocrit levels. - Consider phlebotomy if hematocrit reaches 55 or higher. - Follow up in three months for blood work. - Arrange phlebotomy if hematocrit is elevated during any lab tests.  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 History of multiple knee replacements and severe infection requiring PICC lines. Risk of limb loss if infection recurs and antibiotics are discontinued. - Continue  Cefalexin. - Monitor for infection recurrence.  Gallstones with bile duct obstruction Gallstones caused bile duct obstruction, leading to jaundice and hospitalization. A stent was placed, and a cholecystectomy was performed. An additional stent was placed during recent endoscopy due to difficulty removing the initial stent. Plan for stent removal in eight weeks. - Monitor for bile duct obstruction symptoms. - Follow up with gastroenterology for stent removal in eight weeks.  Cholecystectomy Cholecystectomy performed following gallstone-related bile duct obstruction. Current management involves monitoring post-surgical recovery and stent placement. - Monitor post-surgical recovery. - Follow up with gastroenterology for stent management.  Gastritis and gastropathy Biopsy showed gastritis and gastropathy, likely related to gallbladder issues addressed with cholecystectomy. - Monitor for gastrointestinal symptoms. - Follow up with gastroenterology for ongoing management.   The patient was advised to call immediately if he  has any concerning symptoms in the interval. The patient voices understanding of current disease status and treatment options and is in agreement with the current care plan. All questions were answered. The patient knows to call the clinic with any problems, questions or concerns. We can certainly see the patient much sooner if necessary. The total time spent in the appointment was 30 minutes including review of chart and various tests results, discussions about plan of care and coordination of care plan . Disclaimer: This note was dictated with voice recognition software. Similar sounding words can inadvertently be transcribed and may not be corrected upon review.

## 2024-02-10 ENCOUNTER — Encounter: Payer: Self-pay | Admitting: Cardiovascular Disease

## 2024-02-15 ENCOUNTER — Other Ambulatory Visit (INDEPENDENT_AMBULATORY_CARE_PROVIDER_SITE_OTHER)

## 2024-02-15 ENCOUNTER — Other Ambulatory Visit: Payer: Self-pay

## 2024-02-15 DIAGNOSIS — K8050 Calculus of bile duct without cholangitis or cholecystitis without obstruction: Secondary | ICD-10-CM

## 2024-02-15 DIAGNOSIS — K8001 Calculus of gallbladder with acute cholecystitis with obstruction: Secondary | ICD-10-CM | POA: Diagnosis not present

## 2024-02-15 LAB — CBC WITH DIFFERENTIAL/PLATELET
Basophils Absolute: 0 10*3/uL (ref 0.0–0.1)
Basophils Relative: 0.3 % (ref 0.0–3.0)
Eosinophils Absolute: 0.1 10*3/uL (ref 0.0–0.7)
Eosinophils Relative: 1.4 % (ref 0.0–5.0)
HCT: 52.9 % — ABNORMAL HIGH (ref 39.0–52.0)
Hemoglobin: 18.1 g/dL (ref 13.0–17.0)
Lymphocytes Relative: 23.3 % (ref 12.0–46.0)
Lymphs Abs: 1.4 10*3/uL (ref 0.7–4.0)
MCHC: 34.2 g/dL (ref 30.0–36.0)
MCV: 86.8 fl (ref 78.0–100.0)
Monocytes Absolute: 0.5 10*3/uL (ref 0.1–1.0)
Monocytes Relative: 8 % (ref 3.0–12.0)
Neutro Abs: 4.2 10*3/uL (ref 1.4–7.7)
Neutrophils Relative %: 67 % (ref 43.0–77.0)
Platelets: 166 10*3/uL (ref 150.0–400.0)
RBC: 6.1 Mil/uL — ABNORMAL HIGH (ref 4.22–5.81)
RDW: 16.1 % — ABNORMAL HIGH (ref 11.5–15.5)
WBC: 6.2 10*3/uL (ref 4.0–10.5)

## 2024-02-15 LAB — PROTIME-INR
INR: 2.2 ratio — ABNORMAL HIGH (ref 0.8–1.0)
Prothrombin Time: 22.9 s — ABNORMAL HIGH (ref 9.6–13.1)

## 2024-02-15 LAB — COMPREHENSIVE METABOLIC PANEL WITH GFR
ALT: 20 U/L (ref 0–53)
AST: 18 U/L (ref 0–37)
Albumin: 4.2 g/dL (ref 3.5–5.2)
Alkaline Phosphatase: 63 U/L (ref 39–117)
BUN: 17 mg/dL (ref 6–23)
CO2: 27 meq/L (ref 19–32)
Calcium: 9.9 mg/dL (ref 8.4–10.5)
Chloride: 103 meq/L (ref 96–112)
Creatinine, Ser: 1.11 mg/dL (ref 0.40–1.50)
GFR: 70.13 mL/min (ref >60.00)
Glucose, Bld: 118 mg/dL — ABNORMAL HIGH (ref 70–99)
Potassium: 4.3 meq/L (ref 3.5–5.1)
Sodium: 139 meq/L (ref 135–145)
Total Bilirubin: 1.4 mg/dL — ABNORMAL HIGH (ref 0.2–1.2)
Total Protein: 6.9 g/dL (ref 6.0–8.3)

## 2024-02-16 LAB — HEPATITIS A ANTIBODY, TOTAL: Hepatitis A AB,Total: REACTIVE — AB

## 2024-02-17 ENCOUNTER — Other Ambulatory Visit (HOSPITAL_COMMUNITY): Payer: Self-pay

## 2024-02-17 ENCOUNTER — Other Ambulatory Visit: Payer: Self-pay | Admitting: *Deleted

## 2024-02-17 MED ORDER — MOUNJARO 10 MG/0.5ML ~~LOC~~ SOAJ
10.0000 mg | SUBCUTANEOUS | 3 refills | Status: DC
  Filled 2024-02-17: qty 2, 28d supply, fill #0
  Filled 2024-03-15: qty 2, 28d supply, fill #1
  Filled 2024-04-12: qty 2, 28d supply, fill #2
  Filled 2024-05-10: qty 2, 28d supply, fill #3

## 2024-02-18 ENCOUNTER — Telehealth: Payer: Self-pay | Admitting: Pharmacist

## 2024-02-18 NOTE — Telephone Encounter (Signed)
 Called patient to schedule an appointment for the [facility] [employer] [facility]ication [facility]. I was unable to reach the patient so I left a HIPAA-compliant message requesting that the patient return my call.   Butch Penny, PharmD, Patsy [place], CPP Clinical Pharmacist [facility] [phone]

## 2024-02-21 NOTE — Telephone Encounter (Signed)
 Pt returning call to [facility]. Requesting call back, [phone]

## 2024-02-21 NOTE — Telephone Encounter (Signed)
 FYI

## 2024-02-22 ENCOUNTER — Ambulatory Visit: Attending: Internal Medicine | Admitting: Pharmacist

## 2024-02-22 DIAGNOSIS — Z79899 Other long term (current) drug therapy: Secondary | ICD-10-CM

## 2024-02-22 NOTE — Progress Notes (Signed)
 S: Patient presents today for review of his specialty medication.  Patient is currently taking Taltz  for psoriatric arthritis. Patient is managed by Dr. Lynnell for this.  Adherence: denies any missed doses.  Efficacy: reports that it is still working very well for him.   Dosing:  SubQ:  80 mg every 4 weeks.  Drug-drug interactions: none  Screening: TB test: completed yearly, negative Hepatitis: completed  Monitoring: S/sx of infection: none CBC: see below S/sx of hypersensitivity: none S/sx of malignancy: none S/sx of heart failure: none  O:  Lab Results  Component Value Date   WBC 6.2 02/15/2024   HGB 18.1 Repeated and verified X2. (HH) 02/15/2024   HCT 52.9 (H) 02/15/2024   MCV 86.8 02/15/2024   PLT 166.0 02/15/2024      Chemistry      Component Value Date/Time   NA 139 02/15/2024 0847   NA 133 (L) 01/10/2024 1053   NA 138 05/22/2015 1303   K 4.3 02/15/2024 0847   K 4.1 05/22/2015 1303   CL 103 02/15/2024 0847   CO2 27 02/15/2024 0847   CO2 23 05/22/2015 1303   BUN 17 02/15/2024 0847   BUN 11 01/10/2024 1053   BUN 17.4 05/22/2015 1303   CREATININE 1.11 02/15/2024 0847   CREATININE 1.06 12/06/2023 0923   CREATININE 1.1 05/22/2015 1303      Component Value Date/Time   CALCIUM  9.9 02/15/2024 0847   CALCIUM  9.4 05/22/2015 1303   ALKPHOS 63 02/15/2024 0847   ALKPHOS 73 05/22/2015 1303   AST 18 02/15/2024 0847   AST 23 04/14/2022 1320   AST 24 05/22/2015 1303   ALT 20 02/15/2024 0847   ALT 21 04/14/2022 1320   ALT 30 05/22/2015 1303   BILITOT 1.4 (H) 02/15/2024 0847   BILITOT 1.2 04/14/2022 1320   BILITOT 0.77 05/22/2015 1303       A/P: 1. Medication review: patient is on Taltz  and tolerating it well without any adverse effects. Taltz  was restarted last year and the patient has been doing well since.  Reviewed the medication, including injection technique, adverse effects, and efficacy. No recommendations for changes.   Herlene Fleeta Morris,  PharmD, JAQUELINE, CPP Clinical Pharmacist [facility] [phone]

## 2024-02-22 NOTE — Telephone Encounter (Signed)
 The patient called in returning a call to [facility] and after speaking with Herlene he had me transfer the patient to him.

## 2024-02-28 ENCOUNTER — Encounter (INDEPENDENT_AMBULATORY_CARE_PROVIDER_SITE_OTHER): Payer: Self-pay

## 2024-02-28 ENCOUNTER — Other Ambulatory Visit (HOSPITAL_COMMUNITY): Payer: Self-pay

## 2024-02-28 DIAGNOSIS — G4733 Obstructive sleep apnea (adult) (pediatric): Secondary | ICD-10-CM | POA: Diagnosis not present

## 2024-02-29 ENCOUNTER — Other Ambulatory Visit: Payer: Self-pay

## 2024-02-29 NOTE — Progress Notes (Signed)
 Specialty Pharmacy Refill Coordination Note  [name]. is a 63 y.o. male contacted today regarding refills of specialty medication(s) Ixekizumab  (Taltz )   Patient requested (Patient-Rptd) Pickup at [facility] date: (Patient-Rptd) 03/07/24   Medication will be filled on 03/06/24.

## 2024-03-06 DIAGNOSIS — G4733 Obstructive sleep apnea (adult) (pediatric): Secondary | ICD-10-CM | POA: Diagnosis not present

## 2024-03-13 ENCOUNTER — Other Ambulatory Visit: Payer: Self-pay

## 2024-03-13 ENCOUNTER — Ambulatory Visit: Admitting: Internal Medicine

## 2024-03-13 ENCOUNTER — Encounter: Payer: Self-pay | Admitting: Internal Medicine

## 2024-03-13 VITALS — BP 114/83 | HR 77 | Temp 97.7°F | Ht 70.0 in | Wt 240.0 lb

## 2024-03-13 DIAGNOSIS — T8453XD Infection and inflammatory reaction due to internal right knee prosthesis, subsequent encounter: Secondary | ICD-10-CM

## 2024-03-13 DIAGNOSIS — T8459XD Infection and inflammatory reaction due to other internal joint prosthesis, subsequent encounter: Secondary | ICD-10-CM | POA: Diagnosis not present

## 2024-03-13 DIAGNOSIS — Z79899 Other long term (current) drug therapy: Secondary | ICD-10-CM

## 2024-03-13 DIAGNOSIS — Z5181 Encounter for therapeutic drug level monitoring: Secondary | ICD-10-CM

## 2024-03-13 DIAGNOSIS — Z96659 Presence of unspecified artificial knee joint: Secondary | ICD-10-CM | POA: Diagnosis not present

## 2024-03-13 DIAGNOSIS — D582 Other hemoglobinopathies: Secondary | ICD-10-CM

## 2024-03-13 NOTE — Progress Notes (Unsigned)
 Patient ID: William JONETTA Mariellen Mickey., male   DOB: [birth year], 63 y.o.   MRN: [id number]  HPI  [name] is a 63yo M with hx of pji, T2DM, hx of cholecystitis s/p bile duct stent and GB referral, on chronic abtx for pji, on monjaro.  Takes metamucil daily;  More gas of late  Has to have removal of bile stent - by dr wilhelmenia  Has a new grandchild, 1st. -- came for 3wks early; 6.6 lb. -- baby son; now okay, initially difficulty with feeding.  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  [name] doing waittress in [place] . 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   Outpatient Encounter Medications as of 03/13/2024  Medication Sig   acetaminophen  (TYLENOL ) 500 MG tablet Take 500 mg by mouth daily as needed for mild pain (pain score 1-3) or moderate pain (pain score 4-6).   cefadroxil  (DURICEF) 500 MG capsule Take 1 capsule (500 mg total) by mouth 2 (two) times daily.   empagliflozin  (JARDIANCE ) 10 MG TABS tablet Take 1 tablet (10 mg total) by mouth daily.   Multiple Vitamin (MULTIVITAMIN WITH MINERALS) TABS tablet Take 1 tablet by mouth daily.   mupirocin  ointment (BACTROBAN ) 2 % Apply to affected area twice a day.   Oxymetazoline  HCl (NASAL SPRAY) 0.05 % SOLN Place 1 spray into the nose at bedtime as needed (congestion).   pantoprazole  (PROTONIX ) 40 MG tablet Take 1 tablet (40 mg total) by mouth daily.   rivaroxaban  (XARELTO ) 20 MG TABS tablet Take 1 tablet (20 mg total) by mouth daily with supper.   rosuvastatin  (CRESTOR ) 10 MG tablet Take 1 tablet (10 mg total) by mouth daily.   sacubitril -valsartan  (ENTRESTO ) 24-26 MG Take 1 tablet by mouth 2 (two) times daily.   TALTZ  80 MG/ML pen Inject 1 ml (80mg ) under the skin every 4 weeks.   tirzepatide  (MOUNJARO ) 10 MG/0.5ML Pen Inject 10 mg into the skin once a week.   Vitamin D , Cholecalciferol , 1000 units CAPS Take 1,000 Units by mouth daily.    carvedilol  (COREG ) 12.5 MG tablet Take 1 tablet (12.5  mg total) by mouth 2 (two) times daily. (Patient not taking: Reported on 03/13/2024)   No facility-administered encounter medications on file as of 03/13/2024.     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/06/2022   Osteoarthritis of right knee 05/09/2018   Tubular adenoma of colon 10/18/2017   Diabetic nephropathy associated with type 2 diabetes mellitus (HCC) 10/13/2016   Permanent atrial fibrillation (HCC) 10/13/2016   Polycythemia vera (HCC) 10/09/2014   Essential hypertension 09/17/2014   History of transient ischemic attack 09/05/2012   Gastropathy 08/17/2011   Obesity (BMI 30-39.9) 08/17/2011   Psoriatic arthritis (HCC) 08/17/2011   Obstructive sleep apnea syndrome 08/05/2009     Health Maintenance Due  Topic Date Due   Hepatitis B Vaccines (1  of 3 - Risk 3-dose series) Never done   Colonoscopy  12/24/2022   OPHTHALMOLOGY EXAM  07/28/2023   FOOT EXAM  10/08/2023   COVID-19 Vaccine (6 - Pfizer risk 2024-25 season) 01/03/2024     Review of Systems  Physical Exam   BP 114/83   Pulse 77   Temp 97.7 F (36.5 C) (Temporal)   Ht 5' 10 (1.778 m)   Wt 240 lb (108.9 kg)   SpO2 98%   BMI 34.44 kg/m    No results found for: CD4TCELL No results found for: CD4TABS No results found for: HIV1RNAQUANT Lab Results  Component Value Date   HEPBSAB NON REACTIVE 11/23/2023    No results found for: RPR, LABRPR  CBC Lab Results  Component Value Date   WBC 6.2 02/15/2024   RBC 6.10 (H) 02/15/2024   HGB 18.1 Repeated and verified X2. (HH) 02/15/2024   HCT 52.9 (H) 02/15/2024   PLT 166.0 02/15/2024   MCV 86.8 02/15/2024   MCH 29.5 02/09/2024   MCHC 34.2 02/15/2024   RDW 16.1 (H) 02/15/2024   LYMPHSABS 1.4 02/15/2024   MONOABS 0.5 02/15/2024   EOSABS 0.1 02/15/2024    BMET Lab Results  Component Value Date   NA 139 02/15/2024   K 4.3 02/15/2024   CL 103 02/15/2024   CO2 27 02/15/2024   GLUCOSE 118 (H) 02/15/2024   BUN 17 02/15/2024   CREATININE 1.11 02/15/2024   CALCIUM  9.9 02/15/2024   GFRNONAA 55 (L) 01/05/2024   GFRAA 74 05/27/2020      Assessment and Plan

## 2024-03-14 LAB — BASIC METABOLIC PANEL WITH GFR
BUN: 15 mg/dL (ref 7–25)
CO2: 26 mmol/L (ref 20–32)
Calcium: 9.5 mg/dL (ref 8.6–10.3)
Chloride: 102 mmol/L (ref 98–110)
Creat: 1.07 mg/dL (ref 0.70–1.35)
Glucose, Bld: 105 mg/dL — ABNORMAL HIGH (ref 65–99)
Potassium: 4.3 mmol/L (ref 3.5–5.3)
Sodium: 137 mmol/L (ref 135–146)
eGFR: 77 mL/min/1.73m2 (ref >=60)

## 2024-03-14 LAB — CBC WITH DIFFERENTIAL/PLATELET
Absolute Lymphocytes: 1818 {cells}/uL (ref 850–3900)
Absolute Monocytes: 543 {cells}/uL (ref 200–950)
Basophils Absolute: 18 {cells}/uL (ref 0–200)
Basophils Relative: 0.3 %
Eosinophils Absolute: 92 {cells}/uL (ref 15–500)
Eosinophils Relative: 1.5 %
HCT: 56.6 % — ABNORMAL HIGH (ref 38.5–50.0)
Hemoglobin: 18.5 g/dL — ABNORMAL HIGH (ref 13.2–17.1)
MCH: 29.3 pg (ref 27.0–33.0)
MCHC: 32.7 g/dL (ref 32.0–36.0)
MCV: 89.6 fL (ref 80.0–100.0)
MPV: 9.7 fL (ref 7.5–12.5)
Monocytes Relative: 8.9 %
Neutro Abs: 3630 {cells}/uL (ref 1500–7800)
Neutrophils Relative %: 59.5 %
Platelets: 188 Thousand/uL (ref 140–400)
RBC: 6.32 Million/uL — ABNORMAL HIGH (ref 4.20–5.80)
RDW: 15.1 % — ABNORMAL HIGH (ref 11.0–15.0)
Total Lymphocyte: 29.8 %
WBC: 6.1 Thousand/uL (ref 3.8–10.8)

## 2024-03-14 LAB — SEDIMENTATION RATE: Sed Rate: 2 mm/h (ref 0–20)

## 2024-03-14 LAB — C-REACTIVE PROTEIN: CRP: <3 mg/L (ref <8.0)

## 2024-03-19 ENCOUNTER — Other Ambulatory Visit: Payer: Self-pay

## 2024-03-21 ENCOUNTER — Other Ambulatory Visit (HOSPITAL_COMMUNITY): Payer: Self-pay

## 2024-03-21 ENCOUNTER — Other Ambulatory Visit: Payer: Self-pay

## 2024-03-21 DIAGNOSIS — K219 Gastro-esophageal reflux disease without esophagitis: Secondary | ICD-10-CM

## 2024-03-21 MED ORDER — PANTOPRAZOLE SODIUM 40 MG PO TBEC
40.0000 mg | DELAYED_RELEASE_TABLET | Freq: Every day | ORAL | 0 refills | Status: DC
  Filled 2024-03-21: qty 90, 90d supply, fill #0
  Filled 2024-06-18: qty 90, 90d supply, fill #1

## 2024-03-24 ENCOUNTER — Other Ambulatory Visit: Payer: Self-pay

## 2024-03-24 ENCOUNTER — Other Ambulatory Visit (HOSPITAL_COMMUNITY): Payer: Self-pay

## 2024-03-24 ENCOUNTER — Encounter (INDEPENDENT_AMBULATORY_CARE_PROVIDER_SITE_OTHER): Payer: Self-pay

## 2024-03-24 NOTE — Progress Notes (Signed)
 Specialty Pharmacy Refill Coordination Note  [name]. is a 63 y.o. male contacted today regarding refills of specialty medication(s) Ixekizumab  (Taltz )   Patient requested (Patient-Rptd) Pickup at [facility] date: (Patient-Rptd) 04/01/24   Medication will be filled on 07.31.25.

## 2024-03-29 ENCOUNTER — Other Ambulatory Visit: Payer: Self-pay

## 2024-03-30 ENCOUNTER — Other Ambulatory Visit: Payer: Self-pay

## 2024-04-11 ENCOUNTER — Encounter: Payer: Self-pay | Admitting: Gastroenterology

## 2024-04-11 ENCOUNTER — Other Ambulatory Visit (INDEPENDENT_AMBULATORY_CARE_PROVIDER_SITE_OTHER)

## 2024-04-11 ENCOUNTER — Ambulatory Visit: Admitting: Gastroenterology

## 2024-04-11 ENCOUNTER — Telehealth: Payer: Self-pay

## 2024-04-11 VITALS — BP 110/76 | HR 48 | Ht 70.0 in | Wt 244.0 lb

## 2024-04-11 DIAGNOSIS — Z860100 Personal history of colon polyps, unspecified: Secondary | ICD-10-CM

## 2024-04-11 DIAGNOSIS — K7469 Other cirrhosis of liver: Secondary | ICD-10-CM

## 2024-04-11 DIAGNOSIS — Z860101 Personal history of adenomatous and serrated colon polyps: Secondary | ICD-10-CM

## 2024-04-11 DIAGNOSIS — Z9889 Other specified postprocedural states: Secondary | ICD-10-CM

## 2024-04-11 DIAGNOSIS — R0789 Other chest pain: Secondary | ICD-10-CM | POA: Diagnosis not present

## 2024-04-11 DIAGNOSIS — R142 Eructation: Secondary | ICD-10-CM | POA: Diagnosis not present

## 2024-04-11 DIAGNOSIS — K8050 Calculus of bile duct without cholangitis or cholecystitis without obstruction: Secondary | ICD-10-CM

## 2024-04-11 LAB — CBC WITH DIFFERENTIAL/PLATELET
Basophils Absolute: 0 K/uL (ref 0.0–0.1)
Basophils Relative: 0.5 % (ref 0.0–3.0)
Eosinophils Absolute: 0.1 K/uL (ref 0.0–0.7)
Eosinophils Relative: 1.1 % (ref 0.0–5.0)
HCT: 55.7 % — ABNORMAL HIGH (ref 39.0–52.0)
Hemoglobin: 18.7 g/dL (ref 13.0–17.0)
Lymphocytes Relative: 26.6 % (ref 12.0–46.0)
Lymphs Abs: 2.1 K/uL (ref 0.7–4.0)
MCHC: 33.6 g/dL (ref 30.0–36.0)
MCV: 85.8 fl (ref 78.0–100.0)
Monocytes Absolute: 0.7 K/uL (ref 0.1–1.0)
Monocytes Relative: 8.5 % (ref 3.0–12.0)
Neutro Abs: 4.9 K/uL (ref 1.4–7.7)
Neutrophils Relative %: 63.3 % (ref 43.0–77.0)
Platelets: 187 K/uL (ref 150.0–400.0)
RBC: 6.49 Mil/uL — ABNORMAL HIGH (ref 4.22–5.81)
RDW: 15.1 % (ref 11.5–15.5)
WBC: 7.8 K/uL (ref 4.0–10.5)

## 2024-04-11 LAB — COMPREHENSIVE METABOLIC PANEL WITH GFR
ALT: 25 U/L (ref 0–53)
AST: 24 U/L (ref 0–37)
Albumin: 4.3 g/dL (ref 3.5–5.2)
Alkaline Phosphatase: 58 U/L (ref 39–117)
BUN: 18 mg/dL (ref 6–23)
CO2: 26 meq/L (ref 19–32)
Calcium: 9.5 mg/dL (ref 8.4–10.5)
Chloride: 100 meq/L (ref 96–112)
Creatinine, Ser: 1.07 mg/dL (ref 0.40–1.50)
GFR: 73.21 mL/min (ref >60.00)
Glucose, Bld: 98 mg/dL (ref 70–99)
Potassium: 4.3 meq/L (ref 3.5–5.1)
Sodium: 135 meq/L (ref 135–145)
Total Bilirubin: 1.5 mg/dL — ABNORMAL HIGH (ref 0.2–1.2)
Total Protein: 7 g/dL (ref 6.0–8.3)

## 2024-04-11 NOTE — Patient Instructions (Signed)
 Your provider has requested that you go to the basement level for lab work before leaving today. Press B on the elevator. The lab is located at the first door on the left as you exit the elevator.  You have been scheduled for an endoscopy. Please follow written instructions given to you at your visit today.  If you use inhalers (even only as needed), please bring them with you on the day of your procedure.  If you take any of the following medications, they will need to be adjusted prior to your procedure:   DO NOT TAKE 7 DAYS PRIOR TO TEST- Trulicity  (dulaglutide ) Ozempic , Wegovy  (semaglutide ) Mounjaro  (tirzepatide ) Bydureon Bcise (exanatide extended release)  DO NOT TAKE 1 DAY PRIOR TO YOUR TEST Rybelsus  (semaglutide ) Adlyxin (lixisenatide) Victoza (liraglutide) Byetta (exanatide) ___________________________________________________________________________   Due to recent changes in healthcare laws, you may see the results of your imaging and laboratory studies on MyChart before your provider has had a chance to review them.  We understand that in some cases there may be results that are confusing or concerning to you. Not all laboratory results come back in the same time frame and the provider may be waiting for multiple results in order to interpret others.  Please give us  48 hours in order for your provider to thoroughly review all the results before contacting the office for clarification of your results.   _______________________________________________________  If your blood pressure at your visit was 140/90 or greater, please contact your primary care physician to follow up on this.  _______________________________________________________  If you are age 66 or older, your body mass index should be between 23-30. Your Body mass index is 35.01 kg/m. If this is out of the aforementioned range listed, please consider follow up with your Primary Care Provider.  If you are age 52  or younger, your body mass index should be between 19-25. Your Body mass index is 35.01 kg/m. If this is out of the aformentioned range listed, please consider follow up with your Primary Care Provider.   ________________________________________________________  The [facility] providers would like to encourage you to use MYCHART to communicate with providers for non-urgent requests or questions.  Due to long hold times on the telephone, sending your provider a message by [facility] may be a faster and more efficient way to get a response.  Please allow 48 business hours for a response.  Please remember that this is for non-urgent requests.  _______________________________________________________  [facility] is using a team-based approach to care.  Your team is made up of your doctor and two to three APPS. Our APPS (Nurse Practitioners and Physician Assistants) work with your physician to ensure care continuity for you. They are fully qualified to address your health concerns and develop a treatment plan. They communicate directly with your gastroenterologist to care for you. Seeing the Advanced Practice Practitioners on your physician's team can help you by facilitating care more promptly, often allowing for earlier appointments, access to diagnostic testing, procedures, and other specialty referrals.   Thank you for choosing me and [facility].  Dr. Wilhelmenia

## 2024-04-11 NOTE — Telephone Encounter (Signed)
 Request for surgical clearance:     Endoscopy Procedure  What type of surgery is being performed?     06/29/2024  When is this surgery scheduled?     EGD+ERCP   What type of clearance is required ?   Pharmacy  Are there any medications that need to be held prior to surgery and how long? Xarelto  x2 days prior to procedure.  Practice name and name of physician performing surgery?      [facility]  What is your office phone and fax number?      Phone- [phone]  Fax- [phone]  Anesthesia type (None, local, MAC, general) ?       MAC  Please route your response to Blondie Barks, CMA

## 2024-04-11 NOTE — Progress Notes (Signed)
 GASTROENTEROLOGY OUTPATIENT CLINIC VISIT   Primary Care Provider [name], DO [address] [place], [place] [address] [place] [address] [phone]  Referring Provider Dr. San and Dr. Avram  Patient Profile: [name]. is a 63 y.o. 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  The patient presents to the [facility] for an evaluation and management of problem(s) noted below:  Problem List 1. History of biliary stent insertion   2. Choledocholithiasis   3. Other cirrhosis of liver (HCC)   4. Belching   5. Hx of adenomatous colonic polyps    Discussed the use of AI scribe software for clinical note transcription with the patient, who gave verbal consent to proceed.  History of Present Illness Please see prior GI notes for full details of HPI.  Interval History [name] is a 63 year old male with chronic liver disease and recurring biliary duct issues with retained biliary stent who presents for follow-up (he is accompanied by his wife).  Overall, since his ERCP, he has been doing relatively well.  At times, with certain foods and at night he notices some atypical chest discomfort that is relieved by belching and taking Rolaids.  This occurs infrequently, about once every two weeks.  He continues to take pantoprazole  daily.  As he continues on antibiotics for his knee infection, he feels his GERD symptoms are only controlled by trying to maintain the daily dosing of PPI.  His stools are normal, without looseness or oiliness.  He is aware of the results of his imaging from earlier this year  showing Cirrhosis with splenomegaly.  He has a history of a bile duct stent initially placed for that was embedded during a previous procedure. A second stent was placed alongside it to facilitate removal.  No issues of progressive jaundice or darkened urine or mental status changes.  He is overdue for colon cancer screening and colon polyp surveillance and is interested in whether this can be pursued later this year.  GI Review of Systems Positive as above Negative for dysphagia, odynophagia, melena, hematochezia, pain  Review of Systems General: Denies fevers/chills/weight loss unintentionally Cardiovascular: Denies chest pain Pulmonary: Denies shortness of breath Gastroenterological: See HPI Genitourinary: Denies darkened urine Hematological: History of due to anticoagulation of easy bruising/bleeding Dermatological: Denies jaundice Psychological: Mood is stable  Medications Current Outpatient Medications  Medication Sig Dispense Refill   acetaminophen  (TYLENOL ) 500 MG tablet Take 500 mg by mouth daily as needed for mild pain (pain score 1-3) or moderate pain (pain score 4-6).     carvedilol  (COREG ) 12.5 MG tablet Take 1 tablet (12.5 mg total) by mouth 2 (two) times daily. 180 tablet 3   cefadroxil  (DURICEF) 500 MG capsule Take 1 capsule (500 mg total) by mouth 2 (two) times daily. 60 capsule 11   empagliflozin  (JARDIANCE ) 10 MG TABS tablet Take 1 tablet (10 mg total) by mouth daily. 90 tablet 1   Multiple Vitamin (MULTIVITAMIN WITH MINERALS) TABS tablet Take 1 tablet by mouth daily.     mupirocin  ointment (BACTROBAN ) 2 % Apply to affected area twice a day. 22 g 0   Oxymetazoline  HCl (NASAL SPRAY) 0.05 % SOLN Place 1 spray into the nose at bedtime as needed (congestion).  pantoprazole  (PROTONIX ) 40 MG tablet Take 1 tablet (40 mg total) by mouth daily. 180 tablet 0   rivaroxaban  (XARELTO ) 20 MG TABS tablet Take 1 tablet (20 mg total) by mouth daily with supper. 90 tablet 1    rosuvastatin  (CRESTOR ) 10 MG tablet Take 1 tablet (10 mg total) by mouth daily. 90 tablet 1   sacubitril -valsartan  (ENTRESTO ) 24-26 MG Take 1 tablet by mouth 2 (two) times daily. 180 tablet 1   TALTZ  80 MG/ML pen Inject 1 ml (80mg ) under the skin every 4 weeks. 1 mL 11   tirzepatide  (MOUNJARO ) 10 MG/0.5ML Pen Inject 10 mg into the skin once a week. 2 mL 3   Vitamin D , Cholecalciferol , 1000 units CAPS Take 1,000 Units by mouth daily.      No current facility-administered medications for this visit.    Allergies Allergies  Allergen Reactions   Morphine  Other (See Comments)    Histories Past Medical History:  Diagnosis Date   Arthritis    Calculus of gallbladder with acute cholecystitis 11/23/2023   Cholangitis 11/23/2023   Cholecystitis 11/29/2023   Chronic systolic heart failure (HCC) 09/27/2019   Diabetes mellitus without complication (HCC)    TYPE 2   Diverticulosis    Dizziness 11/11/2022   He was at his baseline state of health until this Sunday, when he experienced dizziness at church. He felt off with blurred vision, and went to wait in the car, but was well enough to drive home. On Monday, he had a similar episode, and had to leave work early, which is very atypical. His dizziness is associated with fatigue and generally not feeling well. It comes and goes, but he hasn't identi   Dyslipidemia    Elevated bilirubin 11/23/2023   GERD (gastroesophageal reflux disease)    Herpes labialis    Hypertension    Longstanding persistent atrial fibrillation (HCC)    Metabolic syndrome    Obesity    Proteinuria    Psoriasis    S/P left TKA 06/28/2018   S/P TKR (total knee replacement), right 05/31/2018   Seborrheic dermatitis    Sleep apnea    very compliant with CPAP, PT NEEDS TO BRING OWN MACHINE   TIA (transient ischemic attack) 2009   Past Surgical History:  Procedure Laterality Date   BILIARY STENT PLACEMENT  11/25/2023   Procedure: INSERTION, STENT, BILE DUCT;   Surgeon: Avram Lupita BRAVO, MD;  Location: [facility] ENDOSCOPY;  Service: Gastroenterology;;   CARDIOVERSION N/A 12/02/2016   Procedure: CARDIOVERSION;  Surgeon: Vinie JAYSON Maxcy, MD;  Location: [facility] ENDOSCOPY;  Service: Cardiovascular;  Laterality: N/A;   CHOLECYSTECTOMY N/A 11/29/2023   Procedure: LAPAROSCOPIC CHOLECYSTECTOMY WITH INTRAOPERATIVE CHOLANGIOGRAM;  Surgeon: Dasie Leonor CROME, MD;  Location: [facility] OR;  Service: General;  Laterality: N/A;   COLONOSCOPY     ERCP N/A 11/25/2023   Procedure: ERCP, WITH INTERVENTION IF INDICATED;  Surgeon: Avram Lupita BRAVO, MD;  Location: [facility] ENDOSCOPY;  Service: Gastroenterology;  Laterality: N/A;  With stent placement   ERCP N/A 01/31/2024   Procedure: ERCP, WITH INTERVENTION IF INDICATED;  Surgeon: Wilhelmenia Aloha Raddle., MD;  Location: [facility] ENDOSCOPY;  Service: Gastroenterology;  Laterality: N/A;   ESOPHAGOGASTRODUODENOSCOPY N/A 11/25/2023   Procedure: EGD (ESOPHAGOGASTRODUODENOSCOPY);  Surgeon: Avram Lupita BRAVO, MD;  Location: [facility] ENDOSCOPY;  Service: Gastroenterology;  Laterality: N/A;   EXCISIONAL TOTAL KNEE ARTHROPLASTY WITH ANTIBIOTIC SPACERS Right 03/28/2019   Procedure: EXCISIONAL TOTAL KNEE ARTHROPLASTY WITH ANTIBIOTIC SPACERS;  Surgeon: Ernie Cough, MD;  Location: [facility] ORS;  Service: Orthopedics;  Laterality: Right;  90 mins   I & D KNEE WITH POLY EXCHANGE Right 09/10/2018   Procedure: Right Knee Arthroplasty IRRIGATION AND DEBRIDEMENT KNEE WITH POLY EXCHANGE;  Surgeon: Ernie Cough, MD;  Location: [facility] ORS;  Service: Orthopedics;  Laterality: Right;   I & D KNEE WITH POLY EXCHANGE Right 06/25/2020   Procedure: Open excisional and non excisional debridement right knee, possible aspiration versus open arthrotomy poly exchange;  Surgeon: Ernie Cough, MD;  Location: [facility] ORS;  Service: Orthopedics;  Laterality: Right;   IRRIGATION AND DEBRIDEMENT KNEE Right 07/09/2020   Procedure: IRRIGATION AND DEBRIDEMENT KNEE;  Surgeon: Ernie Cough, MD;  Location: [facility] ORS;  Service:  Orthopedics;  Laterality: Right;   JOINT REPLACEMENT  11/03/10   LT HIP   PFO occluder cardiac valve  2006   Dr. Ladona, (hole in heart)   REIMPLANTATION OF TOTAL KNEE Right 06/15/2019   Procedure: Resection of antibiotic spacer and  irrigation and debridment and placement of new antibiotic spacer and components;  Surgeon: Ernie Cough, MD;  Location: [facility] ORS;  Service: Orthopedics;  Laterality: Right;  90 mins   REIMPLANTATION OF TOTAL KNEE Right 08/22/2019   Procedure: REIMPLANTATION OF TOTAL KNEE;  Surgeon: Ernie Cough, MD;  Location: [facility] ORS;  Service: Orthopedics;  Laterality: Right;  120 mins   SKIN BIOPSY Left 12/22/2019   epidermal inclusion cyst   SPYGLASS CHOLANGIOSCOPY N/A 01/31/2024   Procedure: CHOLANGIOSCOPY, USING SPYGLASS;  Surgeon: Wilhelmenia Aloha Raddle., MD;  Location: [facility] ENDOSCOPY;  Service: Gastroenterology;  Laterality: N/A;   TONSILLECTOMY AND ADENOIDECTOMY     TOTAL HIP ARTHROPLASTY Left    TOTAL KNEE ARTHROPLASTY Right 05/31/2018   Procedure: RIGHT TOTAL KNEE ARTHROPLASTY;  Surgeon: Ernie Cough, MD;  Location: [facility] ORS;  Service: Orthopedics;  Laterality: Right;  70 mins   TOTAL KNEE ARTHROPLASTY Left 06/28/2018   Procedure: LEFT TOTAL KNEE ARTHROPLASTY;  Surgeon: Ernie Cough, MD;  Location: [facility] ORS;  Service: Orthopedics;  Laterality: Left;  70 mins   WISDOM TOOTH EXTRACTION     Social History   Socioeconomic History   Marital status: Married    Spouse name: Not on file   Number of children: 2   Years of education: Not on file   Highest education level: Bachelor's degree (e.g., BA, AB, BS)  Occupational History   Occupation: Information systems manager: [employer]  Tobacco Use   Smoking status: Never   Smokeless tobacco: Never  Vaping Use   Vaping status: Never Used  Substance and Sexual Activity   Alcohol  use: Yes    Comment: rarely   Drug use: No   Sexual activity: Yes  Other Topics Concern   Not on file  Social History Narrative   Lives  with spouse in [place]   Wife works for [employer] teaching program   Owns Social worker   Social Drivers of Health   Financial Resource Strain: Low Risk  (12/09/2021)   Overall Financial Resource Strain (CARDIA)    Difficulty of Paying Living Expenses: Not hard at all  Food Insecurity: Patient Declined (11/29/2023)   Hunger Vital Sign    Worried About Running Out of Food in the Last Year: Patient declined    Ran Out of Food in the Last Year: Patient declined  Transportation Needs: No Transportation Needs (11/29/2023)   PRAPARE - Administrator, Civil Service (Medical): No    Lack of Transportation (Non-Medical): No  Physical Activity: Insufficiently Active (12/09/2021)   Exercise  Vital Sign    Days of Exercise per Week: 3 days    Minutes of Exercise per Session: 30 min  Stress: Stress Concern Present (12/09/2021)   [employer] of Occupational Health - Occupational Stress Questionnaire    Feeling of Stress : To some extent  Social Connections: Socially Integrated (12/09/2021)   Social Connection and Isolation Panel    Frequency of Communication with Friends and Family: More than three times a week    Frequency of Social Gatherings with Friends and Family: More than three times a week    Attends Religious Services: More than 4 times per year    Active Member of [employer] or Organizations: Yes    Attends Engineer, structural: More than 4 times per year    Marital Status: Married  Catering manager Violence: Not At Risk (11/29/2023)   Humiliation, Afraid, Rape, and Kick questionnaire    Fear of Current or Ex-Partner: No    Emotionally Abused: No    Physically Abused: No    Sexually Abused: No   Family History  Problem Relation Age of Onset   Uterine cancer Mother 45       Cervical cath   Crohn's disease Mother    Esophageal cancer Father    Cancer Other    Obesity Other    Colon cancer Neg Hx    Inflammatory bowel disease Neg Hx    Liver disease  Neg Hx    Pancreatic cancer Neg Hx    Rectal cancer Neg Hx    Stomach cancer Neg Hx    I have reviewed his medical, social, and family history in detail and updated the electronic medical record as necessary.    PHYSICAL EXAMINATION  BP 110/76   Pulse (!) 48   Ht 5' 10 (1.778 m)   Wt 244 lb (110.7 kg)   BMI 35.01 kg/m  Wt Readings from Last 3 Encounters:  04/11/24 244 lb (110.7 kg)  03/13/24 240 lb (108.9 kg)  02/09/24 245 lb 1.6 oz (111.2 kg)  GEN: NAD, appears stated age, doesn't appear chronically ill, accompanied by wife PSYCH: Cooperative, without pressured speech EYE: Conjunctivae pink, sclerae anicteric ENT: MMM CV: Nontachycardic RESP: No audible wheezing GI: NABS, soft, protuberant abdomen, rounded, NT, without rebound or guarding, no HSM appreciated MSK/EXT: No significant lower extremity edema SKIN: No jaundice NEURO:  Alert & Oriented x 3, no focal deficits, no evidence of asterixis   REVIEW OF DATA  I reviewed the following data at the time of this encounter:  GI Procedures and Studies  June 2025 ERCP - Prior biliary sphincterotomy appeared open. - A previously placed stent had migrated into the biliary tree, unfortunately. - The fluoroscopic examination was suspicious for sludge. - Significant sludge and choledocholithiasis debris and small stone was extracted via balloon sweeping. - A single mild biliary narrowing was found in the lower third of the main bile duct. The stricture was inflammatory in appearance. - Sphincteroplasty performed. - As noted above, unsuccessful removal with typical ERCP techniques. - Decision to proceed with cholangioscopy and attempt at removal. - As noted above, unsuccessful removal with cholangioscopy aided ERCP techniques. - One plastic biliary stent was placed into the common bile duct (alongside the stent that is intraductal already). Will speak hide I said thank you guys Laboratory Studies  Reviewed those in epic  Imaging  Studies  March 2025 CT liver IMPRESSION: 1. Morphologic changes of cirrhosis without evidence of hepatic neoplasm. 2. Cholelithiasis and suspected choledocholithiasis  with mild gallbladder wall thickening and surrounding inflammation. There is also wall enhancement of the cystic and common hepatic ducts which are mildly dilated. Findings are suspicious for cholecystitis and cholangitis. Follow-up imaging or ERCP recommended to exclude biliary malignancy. 3. 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 4. Aortic atherosclerosis.  March 2025 MRI liver IMPRESSION: 1. Impacted stone within the distal cystic duct has mass effect upon the common bile duct. The stone measures 0.9 x 0.5 cm. Beyond this level the mid and distal common bile duct are nondilated without signs of distal choledocholithiasis. 2. Mild intrahepatic bile duct dilatation. Enhancement of the wall of the cystic duct and common bile duct noted. Findings are favored to reflect cholangitis. 3. Enhancing soft tissue within the gallbladder measures approximately 2.1 x 2.8 cm. This appears inseparable from the adjacent liver. Cannot exclude underlying gallbladder neoplasm. 4. Cirrhotic morphology of the liver with marked enlargement of the caudate lobe. No enhancing liver lesions identified. 5. Mild splenomegaly. 6. Lipoma within the right posterior paraspinal musculature measures 5.7 x 4.1 x 59.6 cm.   ASSESSMENT  Mr. Kraska is a 63 y.o. male.  The patient is seen today for evaluation and management of:  1. History of biliary stent insertion   2. Choledocholithiasis   3. Other cirrhosis of liver (HCC)   4. Belching   5. Hx of adenomatous colonic polyps    The patient is clinically and hemodynamically stable at this time.  He has compensated cirrhosis.  He will need updated HCC screening and laboratories.  INR difficult to interpret as a result of his Xarelto  use.  His clinical  exam is not suggestive of anything that would suggest decompensation currently.  Will need monitoring.  Expect his MELD will be low based on repeat laboratories.  Most pressing issue is the retained biliary stent after recent cholecystectomy and choledocholithiasis and cystic duct stone extraction.  Hopefully with repeat stenting in place we will have an easier ability to try to remove this.  Risks of stent retainment and need for biliary reconstruction were discussed.  Hopefully will never get to this.  The risks of an ERCP were discussed at length, including but not limited to the risk of perforation, bleeding, abdominal pain, post-ERCP pancreatitis (while usually mild can be severe and even life threatening).  All patient questions were answered to the best of my ability, and the patient agrees to the aforementioned plan of action with follow-up as indicated.  Will plan to pursue a formal EGD to evaluate ensure no evidence of gastric varices or esophageal varices at time of his ERCP.  Updated HCC screening and follow-up of previous abnormal gallbladder region with a CT abdomen with IV and oral contrast.  May continue PPI use for now.  Pending blood count, may require phlebotomy for his known polycythemia vera.  All patient questions were answered to the best of my ability, and the patient agrees to the aforementioned plan of action with follow-up as indicated.   PLAN  Proceed with scheduling EGD with ERCP - EV screening - Biliary stent removal extraction attempt - Xarelto  hold to be obtained Laboratories as outlined below CT abdomen for HCC screening due next month If unsuccessful at biliary stent extraction, academic center evaluation will need to be considered prior to biliary duct reconstruction surgically Colonoscopy can be scheduled later this year for colon polyp surveillance (overdue) but has not biliary issues to deal with currently   Orders Placed This Encounter  Procedures   Procedural/  Surgical Case Request: ERCP, WITH INTERVENTION IF INDICATED, EGD (ESOPHAGOGASTRODUODENOSCOPY)   CBC w/Diff   Comp Met (CMET)   Ambulatory referral to Gastroenterology    New Prescriptions   No medications on file   Modified Medications   No medications on file    Planned Follow Up No follow-ups on file.   Total Time in Face-to-Face and in Coordination of Care for patient including independent/personal interpretation/review of prior testing, medical history, examination, medication adjustment, communicating results with the patient directly, and documentation within the EHR is 30 minutes.   Aloha Finner, MD [facility] Office # [phone]

## 2024-04-12 ENCOUNTER — Other Ambulatory Visit: Payer: Self-pay

## 2024-04-12 ENCOUNTER — Encounter: Payer: Self-pay | Admitting: Gastroenterology

## 2024-04-12 DIAGNOSIS — Z9889 Other specified postprocedural states: Secondary | ICD-10-CM

## 2024-04-12 DIAGNOSIS — Z860101 Personal history of adenomatous and serrated colon polyps: Secondary | ICD-10-CM | POA: Insufficient documentation

## 2024-04-12 DIAGNOSIS — K7469 Other cirrhosis of liver: Secondary | ICD-10-CM

## 2024-04-12 DIAGNOSIS — K8050 Calculus of bile duct without cholangitis or cholecystitis without obstruction: Secondary | ICD-10-CM

## 2024-04-12 DIAGNOSIS — R142 Eructation: Secondary | ICD-10-CM | POA: Insufficient documentation

## 2024-04-14 ENCOUNTER — Ambulatory Visit: Payer: Self-pay | Admitting: Gastroenterology

## 2024-04-17 ENCOUNTER — Other Ambulatory Visit: Payer: Self-pay

## 2024-04-17 ENCOUNTER — Encounter (INDEPENDENT_AMBULATORY_CARE_PROVIDER_SITE_OTHER): Payer: Self-pay

## 2024-04-17 ENCOUNTER — Telehealth: Payer: Self-pay | Admitting: Internal Medicine

## 2024-04-17 NOTE — Telephone Encounter (Signed)
 Patient with diagnosis of atrial fibrillation on Xarelto  for anticoagulation.    Procedure: EGD + ERCP Date of procedure: 06/29/24   CHA2DS2-VASc Score = 3   This indicates a 3.2% annual risk of stroke. The patient's score is based upon: CHF History: 1 HTN History: 1 Diabetes History: 1 Stroke History: 0 Vascular Disease History: 0 Age Score: 0 Gender Score: 0   Chart indicated hx of TIA 2012   CrCl 109 Platelet count 187  Patient has not had an Afib/aflutter ablation within the last 3 months or DCCV within the last 30 days  Per office protocol, patient can hold Xarelto  for 2 days prior to procedure.   Patient will not need bridging with Lovenox  (enoxaparin ) around procedure.  **This guidance is not considered finalized until pre-operative APP has relayed final recommendations.**

## 2024-04-17 NOTE — Telephone Encounter (Signed)
   Patient Name: [name].  DOB: Nov 15, 1960 MRN: [id number]  Primary Cardiologist: Aleene Passe, MD (Inactive)  Clinical pharmacists have reviewed the patient's past medical history, labs, and current medications as part of preoperative protocol coverage. The following recommendations have been made:  Patient has not had an Afib/aflutter ablation within the last 3 months or DCCV within the last 30 days   Per office protocol, patient can hold Xarelto  for 2 days prior to procedure.   Patient will not need bridging with Lovenox  (enoxaparin ) around procedure.   I will route this recommendation to the requesting party via Epic fax function and remove from pre-op pool.  Please call with questions.  Lum LITTIE Louis, NP 04/17/2024, 3:41 PM

## 2024-04-17 NOTE — Progress Notes (Signed)
 Message sent to charge nurse to arrange phlebotomy.

## 2024-04-17 NOTE — Telephone Encounter (Signed)
 Left the patient a voicemail with the scheduled phlebotomy this wednesday 8/20.

## 2024-04-17 NOTE — Telephone Encounter (Signed)
 Per Cardiology - Per office protocol, patient can hold Xarelto  for 2 days prior to procedure.   Patient will not need bridging with Lovenox  (enoxaparin ) around procedure.  Patient has been informed and voiced understanding.

## 2024-04-17 NOTE — Progress Notes (Signed)
 Specialty Pharmacy Refill Coordination Note  Babak Lucus. is a 63 y.o. male contacted today regarding refills of specialty medication(s) Ixekizumab  (Taltz )   Patient requested (Patient-Rptd) Pickup at [facility] date: (Patient-Rptd) 04/24/24   Medication will be filled on 08.22.25.

## 2024-04-19 ENCOUNTER — Inpatient Hospital Stay: Attending: Internal Medicine

## 2024-04-19 VITALS — BP 97/69 | HR 67 | Temp 97.8°F | Resp 18

## 2024-04-19 DIAGNOSIS — D751 Secondary polycythemia: Secondary | ICD-10-CM | POA: Diagnosis not present

## 2024-04-19 DIAGNOSIS — D45 Polycythemia vera: Secondary | ICD-10-CM

## 2024-04-19 NOTE — Progress Notes (Signed)
 William D Isidore Jr. presents today for phlebotomy per MD orders. Phlebotomy procedure started at 1550 and ended at 1616. 503 grams removed from R AC 20g. Patient declined 30 minutes observation. Patient tolerated procedure well. IV needle removed intact.

## 2024-04-19 NOTE — Patient Instructions (Signed)

## 2024-04-21 ENCOUNTER — Other Ambulatory Visit: Payer: Self-pay

## 2024-04-24 ENCOUNTER — Other Ambulatory Visit (HOSPITAL_COMMUNITY): Payer: Self-pay

## 2024-05-08 ENCOUNTER — Other Ambulatory Visit: Payer: Self-pay

## 2024-05-10 ENCOUNTER — Other Ambulatory Visit (HOSPITAL_COMMUNITY): Payer: Self-pay

## 2024-05-11 ENCOUNTER — Other Ambulatory Visit: Payer: Self-pay | Admitting: Internal Medicine

## 2024-05-11 ENCOUNTER — Other Ambulatory Visit (HOSPITAL_COMMUNITY): Payer: Self-pay

## 2024-05-11 DIAGNOSIS — E1121 Type 2 diabetes mellitus with diabetic nephropathy: Secondary | ICD-10-CM

## 2024-05-11 MED ORDER — SACUBITRIL-VALSARTAN 24-26 MG PO TABS
1.0000 | ORAL_TABLET | Freq: Two times a day (BID) | ORAL | 1 refills | Status: AC
  Filled 2024-05-11: qty 180, 90d supply, fill #0
  Filled 2024-08-04: qty 180, 90d supply, fill #1

## 2024-05-11 MED ORDER — EMPAGLIFLOZIN 10 MG PO TABS
10.0000 mg | ORAL_TABLET | Freq: Every day | ORAL | 1 refills | Status: AC
  Filled 2024-05-11: qty 90, 90d supply, fill #0
  Filled 2024-08-04: qty 90, 90d supply, fill #1

## 2024-05-15 ENCOUNTER — Encounter (INDEPENDENT_AMBULATORY_CARE_PROVIDER_SITE_OTHER): Payer: Self-pay

## 2024-05-15 ENCOUNTER — Other Ambulatory Visit: Payer: Self-pay

## 2024-05-15 ENCOUNTER — Other Ambulatory Visit (HOSPITAL_COMMUNITY): Payer: Self-pay

## 2024-05-15 ENCOUNTER — Encounter: Payer: Self-pay | Admitting: Internal Medicine

## 2024-05-16 ENCOUNTER — Other Ambulatory Visit: Payer: Self-pay | Admitting: Pharmacy Technician

## 2024-05-16 ENCOUNTER — Other Ambulatory Visit (HOSPITAL_COMMUNITY): Payer: Self-pay

## 2024-05-16 ENCOUNTER — Other Ambulatory Visit: Payer: Self-pay

## 2024-05-16 ENCOUNTER — Other Ambulatory Visit: Payer: Self-pay | Admitting: Internal Medicine

## 2024-05-16 DIAGNOSIS — D45 Polycythemia vera: Secondary | ICD-10-CM

## 2024-05-16 MED ORDER — COMIRNATY 30 MCG/0.3ML IM SUSY
0.3000 mL | PREFILLED_SYRINGE | Freq: Once | INTRAMUSCULAR | 0 refills | Status: AC
  Filled 2024-05-16: qty 0.3, 1d supply, fill #0

## 2024-05-16 MED ORDER — FLUZONE 0.5 ML IM SUSY
0.5000 mL | PREFILLED_SYRINGE | Freq: Once | INTRAMUSCULAR | 0 refills | Status: AC
  Filled 2024-05-16: qty 0.5, 1d supply, fill #0

## 2024-05-16 NOTE — Progress Notes (Signed)
 Specialty Pharmacy Refill Coordination Note  [name]. is a 63 y.o. male contacted today regarding refills of specialty medication(s) Ixekizumab  (Taltz )   Patient requested [name] at [facility] at [place] date: 05/24/24   Medication will be filled on 05/23/24.  Questionnaire answered.

## 2024-05-17 ENCOUNTER — Other Ambulatory Visit (HOSPITAL_COMMUNITY): Payer: Self-pay

## 2024-05-17 ENCOUNTER — Inpatient Hospital Stay

## 2024-05-17 ENCOUNTER — Inpatient Hospital Stay: Attending: Internal Medicine

## 2024-05-17 ENCOUNTER — Inpatient Hospital Stay (HOSPITAL_BASED_OUTPATIENT_CLINIC_OR_DEPARTMENT_OTHER): Admitting: Internal Medicine

## 2024-05-17 VITALS — BP 123/85 | HR 71 | Resp 18

## 2024-05-17 VITALS — BP 126/78 | HR 75 | Temp 97.3°F | Resp 17 | Ht 70.0 in | Wt 246.5 lb

## 2024-05-17 DIAGNOSIS — G4730 Sleep apnea, unspecified: Secondary | ICD-10-CM | POA: Diagnosis not present

## 2024-05-17 DIAGNOSIS — D45 Polycythemia vera: Secondary | ICD-10-CM

## 2024-05-17 DIAGNOSIS — D751 Secondary polycythemia: Secondary | ICD-10-CM | POA: Diagnosis not present

## 2024-05-17 DIAGNOSIS — J449 Chronic obstructive pulmonary disease, unspecified: Secondary | ICD-10-CM | POA: Insufficient documentation

## 2024-05-17 LAB — CMP (CANCER CENTER ONLY)
ALT: 20 U/L (ref 0–44)
AST: 17 U/L (ref 15–41)
Albumin: 4.1 g/dL (ref 3.5–5.0)
Alkaline Phosphatase: 66 U/L (ref 38–126)
Anion gap: 7 (ref 5–15)
BUN: 18 mg/dL (ref 8–23)
CO2: 24 mmol/L (ref 22–32)
Calcium: 8.9 mg/dL (ref 8.9–10.3)
Chloride: 104 mmol/L (ref 98–111)
Creatinine: 1 mg/dL (ref 0.61–1.24)
GFR, Estimated: >60 mL/min
Glucose, Bld: 110 mg/dL — ABNORMAL HIGH (ref 70–99)
Potassium: 4.2 mmol/L (ref 3.5–5.1)
Sodium: 135 mmol/L (ref 135–145)
Total Bilirubin: 1.2 mg/dL (ref 0.0–1.2)
Total Protein: 6.6 g/dL (ref 6.5–8.1)

## 2024-05-17 LAB — CBC WITH DIFFERENTIAL (CANCER CENTER ONLY)
Abs Immature Granulocytes: 0.03 K/uL (ref 0.00–0.07)
Basophils Absolute: 0 K/uL (ref 0.0–0.1)
Basophils Relative: 1 %
Eosinophils Absolute: 0.2 K/uL (ref 0.0–0.5)
Eosinophils Relative: 3 %
HCT: 50 % (ref 39.0–52.0)
Hemoglobin: 17.2 g/dL — ABNORMAL HIGH (ref 13.0–17.0)
Immature Granulocytes: 1 %
Lymphocytes Relative: 23 %
Lymphs Abs: 1.6 K/uL (ref 0.7–4.0)
MCH: 27.5 pg (ref 26.0–34.0)
MCHC: 34.4 g/dL (ref 30.0–36.0)
MCV: 79.9 fL — ABNORMAL LOW (ref 80.0–100.0)
Monocytes Absolute: 0.7 K/uL (ref 0.1–1.0)
Monocytes Relative: 10 %
Neutro Abs: 4.2 K/uL (ref 1.7–7.7)
Neutrophils Relative %: 62 %
Platelet Count: 152 K/uL (ref 150–400)
RBC: 6.26 MIL/uL — ABNORMAL HIGH (ref 4.22–5.81)
RDW: 15.1 % (ref 11.5–15.5)
WBC Count: 6.6 K/uL (ref 4.0–10.5)
nRBC: 0 % (ref 0.0–0.2)

## 2024-05-17 LAB — LACTATE DEHYDROGENASE: LDH: 93 U/L — ABNORMAL LOW (ref 98–192)

## 2024-05-17 NOTE — Progress Notes (Signed)
 Kaylor D Boger Jr. presents today for phlebotomy per MD orders. 16 g to R antecubital, 2 previous sticks to L forearm, unsuccessful. Phlebotomy procedure started at 1554 and ended at 1611. 539 grams removed. Patient observed for 20 minutes after procedure without any incident, declined to stay for 30 minutes. Patient tolerated procedure well. IV needle removed intact. Patient given drink, DC'd home in stable condition, VS WNL.

## 2024-05-17 NOTE — Progress Notes (Signed)
 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 Negative MPL515 mutation, Negative BCR/ABL and negative JAK-2 exon 12.  PRIOR THERAPY: None  CURRENT THERAPY: Phlebotomy on as-needed basis.  INTERVAL HISTORY: [name]. 63 y.o. male returns to the clinic today for follow-up visit.Discussed the use of AI scribe software for clinical note transcription with the patient, who gave verbal consent to proceed.  History of Present Illness [name] is a 63 year old male with persistent polycythemia who presents for evaluation and repeat blood work.  He has a history of persistent polycythemia, with suspicion for polycythemia vera, although his JAK2 mutation panel has consistently returned negative results. He manages his condition with phlebotomy on an as-needed basis. His current hemoglobin level is 17.2, and hematocrit is 50; previous levels were 55-56.  He underwent gallbladder surgery in the past, initially complicated by a stone blocking the bile duct. He now feels better and reports improved sleep. He is scheduled for a procedure on October 30th to remove two stents left in the bile duct.  He mentions a weight fluctuation, having dropped to 232 pounds post-surgery and now being at 236 pounds. He uses a sleep app on his phone, reporting no events per hour, indicating improved sleep quality. He wakes up every two and a half hours to use the restroom.  He has a history of COPD and sleep apnea. He has a total joint replacement in his left hip and mentions that any bone marrow biopsy should be performed on the right side.      MEDICAL HISTORY: Past Medical History:  Diagnosis Date   Arthritis    Calculus of gallbladder with acute  cholecystitis 11/23/2023   Cholangitis 11/23/2023   Cholecystitis 11/29/2023   Chronic systolic heart failure (HCC) 09/27/2019   Diabetes mellitus without complication (HCC)    TYPE 2   Diverticulosis    Dizziness 11/11/2022   He was at his baseline state of health until this Sunday, when he experienced dizziness at church. He felt off with blurred vision, and went to wait in the car, but was well enough to drive home. On Monday, he had a similar episode, and had to leave work early, which is very atypical. His dizziness is associated with fatigue and generally not feeling well. It comes and goes, but he hasn't identi   Dyslipidemia    Elevated bilirubin 11/23/2023   GERD (gastroesophageal reflux disease)    Herpes labialis    Hypertension    Longstanding persistent atrial fibrillation (HCC)    Metabolic syndrome    Obesity    Proteinuria    Psoriasis    S/P left TKA 06/28/2018   S/P TKR (total knee replacement), right 05/31/2018   Seborrheic dermatitis    Sleep apnea    very compliant with CPAP, PT NEEDS TO BRING OWN MACHINE   TIA (transient ischemic attack) 2009    ALLERGIES:  is allergic to morphine .  MEDICATIONS:  Current Outpatient Medications  Medication Sig Dispense Refill   acetaminophen  (TYLENOL ) 500 MG tablet Take 500 mg by mouth daily as needed for mild pain (pain score 1-3) or moderate pain (pain score 4-6).     carvedilol  (COREG ) 12.5 MG tablet Take 1 tablet (12.5 mg total) by mouth 2 (two)  times daily. 180 tablet 3   cefadroxil  (DURICEF) 500 MG capsule Take 1 capsule (500 mg total) by mouth 2 (two) times daily. 60 capsule 11   COVID-19 mRNA vaccine, Pfizer, (COMIRNATY ) syringe Inject 0.3 mLs into the muscle once for 1 dose. 0.3 mL 0   empagliflozin  (JARDIANCE ) 10 MG TABS tablet Take 1 tablet (10 mg total) by mouth daily. 90 tablet 1   influenza vac split trivalent PF (FLUZONE ) 0.5 ML injection Inject 0.5 mLs into the muscle once for 1 dose. 0.5 mL 0   Multiple  Vitamin (MULTIVITAMIN WITH MINERALS) TABS tablet Take 1 tablet by mouth daily.     mupirocin  ointment (BACTROBAN ) 2 % Apply to affected area twice a day. 22 g 0   Oxymetazoline  HCl (NASAL SPRAY) 0.05 % SOLN Place 1 spray into the nose at bedtime as needed (congestion).     pantoprazole  (PROTONIX ) 40 MG tablet Take 1 tablet (40 mg total) by mouth daily. 180 tablet 0   rivaroxaban  (XARELTO ) 20 MG TABS tablet Take 1 tablet (20 mg total) by mouth daily with supper. 90 tablet 1   rosuvastatin  (CRESTOR ) 10 MG tablet Take 1 tablet (10 mg total) by mouth daily. 90 tablet 1   sacubitril -valsartan  (ENTRESTO ) 24-26 MG Take 1 tablet by mouth 2 (two) times daily. 180 tablet 1   TALTZ  80 MG/ML pen Inject 1 ml (80mg ) under the skin every 4 weeks. 1 mL 11   tirzepatide  (MOUNJARO ) 10 MG/0.5ML Pen Inject 10 mg into the skin once a week. 2 mL 3   Vitamin D , Cholecalciferol , 1000 units CAPS Take 1,000 Units by mouth daily.      No current facility-administered medications for this visit.    SURGICAL HISTORY:  Past Surgical History:  Procedure Laterality Date   BILIARY STENT PLACEMENT  11/25/2023   Procedure: INSERTION, STENT, BILE DUCT;  Surgeon: Avram Lupita BRAVO, MD;  Location: [facility] ENDOSCOPY;  Service: Gastroenterology;;   CARDIOVERSION N/A 12/02/2016   Procedure: CARDIOVERSION;  Surgeon: Vinie JAYSON Maxcy, MD;  Location: [facility] ENDOSCOPY;  Service: Cardiovascular;  Laterality: N/A;   CHOLECYSTECTOMY N/A 11/29/2023   Procedure: LAPAROSCOPIC CHOLECYSTECTOMY WITH INTRAOPERATIVE CHOLANGIOGRAM;  Surgeon: Dasie Leonor CROME, MD;  Location: [facility] OR;  Service: General;  Laterality: N/A;   COLONOSCOPY     ERCP N/A 11/25/2023   Procedure: ERCP, WITH INTERVENTION IF INDICATED;  Surgeon: Avram Lupita BRAVO, MD;  Location: [facility] ENDOSCOPY;  Service: Gastroenterology;  Laterality: N/A;  With stent placement   ERCP N/A 01/31/2024   Procedure: ERCP, WITH INTERVENTION IF INDICATED;  Surgeon: Wilhelmenia Aloha Raddle., MD;  Location: [facility] ENDOSCOPY;   Service: Gastroenterology;  Laterality: N/A;   ESOPHAGOGASTRODUODENOSCOPY N/A 11/25/2023   Procedure: EGD (ESOPHAGOGASTRODUODENOSCOPY);  Surgeon: Avram Lupita BRAVO, MD;  Location: [facility] ENDOSCOPY;  Service: Gastroenterology;  Laterality: N/A;   EXCISIONAL TOTAL KNEE ARTHROPLASTY WITH ANTIBIOTIC SPACERS Right 03/28/2019   Procedure: EXCISIONAL TOTAL KNEE ARTHROPLASTY WITH ANTIBIOTIC SPACERS;  Surgeon: Ernie Cough, MD;  Location: [facility] ORS;  Service: Orthopedics;  Laterality: Right;  90 mins   I & D KNEE WITH POLY EXCHANGE Right 09/10/2018   Procedure: Right Knee Arthroplasty IRRIGATION AND DEBRIDEMENT KNEE WITH POLY EXCHANGE;  Surgeon: Ernie Cough, MD;  Location: [facility] ORS;  Service: Orthopedics;  Laterality: Right;   I & D KNEE WITH POLY EXCHANGE Right 06/25/2020   Procedure: Open excisional and non excisional debridement right knee, possible aspiration versus open arthrotomy poly exchange;  Surgeon: Ernie Cough, MD;  Location: [facility] ORS;  Service: Orthopedics;  Laterality:  Right;   IRRIGATION AND DEBRIDEMENT KNEE Right 07/09/2020   Procedure: IRRIGATION AND DEBRIDEMENT KNEE;  Surgeon: Ernie Cough, MD;  Location: [facility] ORS;  Service: Orthopedics;  Laterality: Right;   JOINT REPLACEMENT  11/03/10   LT HIP   PFO occluder cardiac valve  2006   Dr. Ladona, (hole in heart)   REIMPLANTATION OF TOTAL KNEE Right 06/15/2019   Procedure: Resection of antibiotic spacer and  irrigation and debridment and placement of new antibiotic spacer and components;  Surgeon: Ernie Cough, MD;  Location: [facility] ORS;  Service: Orthopedics;  Laterality: Right;  90 mins   REIMPLANTATION OF TOTAL KNEE Right 08/22/2019   Procedure: REIMPLANTATION OF TOTAL KNEE;  Surgeon: Ernie Cough, MD;  Location: [facility] ORS;  Service: Orthopedics;  Laterality: Right;  120 mins   SKIN BIOPSY Left 12/22/2019   epidermal inclusion cyst   SPYGLASS CHOLANGIOSCOPY N/A 01/31/2024   Procedure: CHOLANGIOSCOPY, USING SPYGLASS;  Surgeon: Wilhelmenia Aloha Raddle., MD;   Location: [facility] ENDOSCOPY;  Service: Gastroenterology;  Laterality: N/A;   TONSILLECTOMY AND ADENOIDECTOMY     TOTAL HIP ARTHROPLASTY Left    TOTAL KNEE ARTHROPLASTY Right 05/31/2018   Procedure: RIGHT TOTAL KNEE ARTHROPLASTY;  Surgeon: Ernie Cough, MD;  Location: [facility] ORS;  Service: Orthopedics;  Laterality: Right;  70 mins   TOTAL KNEE ARTHROPLASTY Left 06/28/2018   Procedure: LEFT TOTAL KNEE ARTHROPLASTY;  Surgeon: Ernie Cough, MD;  Location: [facility] ORS;  Service: Orthopedics;  Laterality: Left;  70 mins   WISDOM TOOTH EXTRACTION      REVIEW OF SYSTEMS:  A comprehensive review of systems was negative except for: Constitutional: positive for fatigue   PHYSICAL EXAMINATION: General appearance: alert, cooperative, fatigued, and no distress Head: Normocephalic, without obvious abnormality, atraumatic Neck: no adenopathy, no JVD, supple, symmetrical, trachea midline, and thyroid  not enlarged, symmetric, no tenderness/mass/nodules Lymph nodes: Cervical, supraclavicular, and axillary nodes normal. Resp: clear to auscultation bilaterally Back: symmetric, no curvature. ROM normal. No CVA tenderness. Cardio: regular rate and rhythm, S1, S2 normal, no murmur, click, rub or gallop GI: soft, non-tender; bowel sounds normal; no masses,  no organomegaly Extremities: extremities normal, atraumatic, no cyanosis or edema  ECOG PERFORMANCE STATUS: 1 - Symptomatic but completely ambulatory  Blood pressure 126/78, pulse 75, temperature (!) 97.3 F (36.3 C), resp. rate 17, height 5' 10 (1.778 m), weight 246 lb 8 oz (111.8 kg), SpO2 97%.  LABORATORY DATA: Lab Results  Component Value Date   WBC 6.6 05/17/2024   HGB 17.2 (H) 05/17/2024   HCT 50.0 05/17/2024   MCV 79.9 (L) 05/17/2024   PLT 152 05/17/2024      Chemistry      Component Value Date/Time   NA 135 04/11/2024 1028   NA 133 (L) 01/10/2024 1053   NA 138 05/22/2015 1303   K 4.3 04/11/2024 1028   K 4.1 05/22/2015 1303   CL 100 04/11/2024 1028    CO2 26 04/11/2024 1028   CO2 23 05/22/2015 1303   BUN 18 04/11/2024 1028   BUN 11 01/10/2024 1053   BUN 17.4 05/22/2015 1303   CREATININE 1.07 04/11/2024 1028   CREATININE 1.07 03/13/2024 1028   CREATININE 1.1 05/22/2015 1303      Component Value Date/Time   CALCIUM  9.5 04/11/2024 1028   CALCIUM  9.4 05/22/2015 1303   ALKPHOS 58 04/11/2024 1028   ALKPHOS 73 05/22/2015 1303   AST 24 04/11/2024 1028   AST 23 04/14/2022 1320   AST 24 05/22/2015 1303   ALT 25 04/11/2024 1028  ALT 21 04/14/2022 1320   ALT 30 05/22/2015 1303   BILITOT 1.5 (H) 04/11/2024 1028   BILITOT 1.2 04/14/2022 1320   BILITOT 0.77 05/22/2015 1303       RADIOGRAPHIC STUDIES: No results found.   ASSESSMENT AND PLAN: This is a very pleasant 63 years old white male with polycythemia highly suspicious for polycythemia vera but with negative JAK mutation.   He has been receiving phlebotomy on as-needed basis to keep his hematocrit between 45-50%. The patient is doing fine today with no concerning complaints except for mild fatigue. Hemoglobin is 17.1 hematocrit 50.0%. Assessment and Plan Assessment & Plan Polycythemia (suspected polycythemia vera, JAK2 negative) Persistent polycythemia with elevated hemoglobin (17.2) and hematocrit (50). Previous levels were higher (55-56). Multiple negative JAK2 mutation panels suggest reactive polycythemia, possibly due to COPD or sleep apnea. No prior bone marrow biopsy has been performed, which is necessary to rule out myeloproliferative disorders like polycythemia vera. Although JAK2 mutation is positive in 95% of polycythemia vera cases, a biopsy is needed to confirm no underlying myeloproliferative disorder is missed. - Order bone marrow biopsy to rule out myeloproliferative disorders. - Perform phlebotomy today to manage elevated hemoglobin and hematocrit levels. - Schedule follow-up appointment in one month to discuss bone marrow biopsy results. He was advised to call  immediately if he has any concerning symptoms in the interval.  The patient voices understanding of current disease status and treatment options and is in agreement with the current care plan. All questions were answered. The patient knows to call the clinic with any problems, questions or concerns. We can certainly see the patient much sooner if necessary. The total time spent in the appointment was 20 minutes including review of chart and various tests results, discussions about plan of care and coordination of care plan . Disclaimer: This note was dictated with voice recognition software. Similar sounding words can inadvertently be transcribed and may not be corrected upon review.

## 2024-05-29 DIAGNOSIS — G4733 Obstructive sleep apnea (adult) (pediatric): Secondary | ICD-10-CM | POA: Diagnosis not present

## 2024-06-05 ENCOUNTER — Encounter: Payer: Self-pay | Admitting: Internal Medicine

## 2024-06-05 ENCOUNTER — Other Ambulatory Visit (HOSPITAL_COMMUNITY): Payer: Self-pay

## 2024-06-05 ENCOUNTER — Ambulatory Visit: Payer: Self-pay | Admitting: Internal Medicine

## 2024-06-05 VITALS — BP 113/72 | HR 60 | Temp 97.5°F | Ht 70.0 in | Wt 243.8 lb

## 2024-06-05 DIAGNOSIS — E669 Obesity, unspecified: Secondary | ICD-10-CM | POA: Diagnosis not present

## 2024-06-05 DIAGNOSIS — Z79899 Other long term (current) drug therapy: Secondary | ICD-10-CM

## 2024-06-05 DIAGNOSIS — E119 Type 2 diabetes mellitus without complications: Secondary | ICD-10-CM

## 2024-06-05 DIAGNOSIS — L4050 Arthropathic psoriasis, unspecified: Secondary | ICD-10-CM | POA: Diagnosis not present

## 2024-06-05 DIAGNOSIS — G4733 Obstructive sleep apnea (adult) (pediatric): Secondary | ICD-10-CM | POA: Diagnosis not present

## 2024-06-05 DIAGNOSIS — K7469 Other cirrhosis of liver: Secondary | ICD-10-CM

## 2024-06-05 DIAGNOSIS — I110 Hypertensive heart disease with heart failure: Secondary | ICD-10-CM | POA: Diagnosis not present

## 2024-06-05 DIAGNOSIS — Z8619 Personal history of other infectious and parasitic diseases: Secondary | ICD-10-CM

## 2024-06-05 DIAGNOSIS — Z23 Encounter for immunization: Secondary | ICD-10-CM | POA: Diagnosis not present

## 2024-06-05 DIAGNOSIS — E1122 Type 2 diabetes mellitus with diabetic chronic kidney disease: Secondary | ICD-10-CM | POA: Diagnosis not present

## 2024-06-05 DIAGNOSIS — M5382 Other specified dorsopathies, cervical region: Secondary | ICD-10-CM

## 2024-06-05 DIAGNOSIS — I5020 Unspecified systolic (congestive) heart failure: Secondary | ICD-10-CM | POA: Diagnosis not present

## 2024-06-05 DIAGNOSIS — K831 Obstruction of bile duct: Secondary | ICD-10-CM

## 2024-06-05 DIAGNOSIS — Z7984 Long term (current) use of oral hypoglycemic drugs: Secondary | ICD-10-CM

## 2024-06-05 DIAGNOSIS — D45 Polycythemia vera: Secondary | ICD-10-CM | POA: Diagnosis not present

## 2024-06-05 DIAGNOSIS — N181 Chronic kidney disease, stage 1: Secondary | ICD-10-CM

## 2024-06-05 DIAGNOSIS — I4821 Permanent atrial fibrillation: Secondary | ICD-10-CM

## 2024-06-05 DIAGNOSIS — I10 Essential (primary) hypertension: Secondary | ICD-10-CM

## 2024-06-05 DIAGNOSIS — Z7985 Long-term (current) use of injectable non-insulin antidiabetic drugs: Secondary | ICD-10-CM

## 2024-06-05 DIAGNOSIS — Z8673 Personal history of transient ischemic attack (TIA), and cerebral infarction without residual deficits: Secondary | ICD-10-CM | POA: Diagnosis not present

## 2024-06-05 DIAGNOSIS — Z7901 Long term (current) use of anticoagulants: Secondary | ICD-10-CM

## 2024-06-05 LAB — POCT GLYCOSYLATED HEMOGLOBIN (HGB A1C): HbA1c, POC (controlled diabetic range): 5.2 % (ref 0.0–7.0)

## 2024-06-05 LAB — GLUCOSE, CAPILLARY: Glucose-Capillary: 106 mg/dL — ABNORMAL HIGH (ref 70–99)

## 2024-06-05 MED ORDER — TIRZEPATIDE 15 MG/0.5ML ~~LOC~~ SOAJ
15.0000 mg | SUBCUTANEOUS | 3 refills | Status: AC
  Filled 2024-06-05: qty 2, 28d supply, fill #0
  Filled 2024-06-28: qty 2, 28d supply, fill #1
  Filled 2024-07-26: qty 2, 28d supply, fill #2
  Filled 2024-08-23: qty 2, 28d supply, fill #3
  Filled 2024-09-20: qty 2, 28d supply, fill #4
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
## 2024-06-05 NOTE — Assessment & Plan Note (Signed)
  Orders:   POC Hbg A1C
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
## 2024-06-07 DIAGNOSIS — M5382 Other specified dorsopathies, cervical region: Secondary | ICD-10-CM | POA: Insufficient documentation

## 2024-06-07 NOTE — Assessment & Plan Note (Addendum)
 Morphologic changes of cirrhosis on imaging.  Will go ahead and get him immunized for Hep A and B.

## 2024-06-07 NOTE — Assessment & Plan Note (Signed)
 Microalbuminuria stable.  Continue entresto  and jardiance 

## 2024-06-07 NOTE — Assessment & Plan Note (Signed)
 Relieved by biliary stent plaeement but stent was unable to be retireved, A second stent was placed with hopes to remove the entire apartus, has plans for this removal on 10/30 with Dr Wilhelmenia.
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
## 2024-06-07 NOTE — Assessment & Plan Note (Addendum)
 Increase mounjaro  to 15mg  weekly. Physicial activity limited by knee and hip issues.

## 2024-06-07 NOTE — Assessment & Plan Note (Signed)
 Managed with Xarelto . History of unsuccessful cardioversions. - Continue Xarelto  for anticoagulation.

## 2024-06-07 NOTE — Assessment & Plan Note (Addendum)
 Follows with Dr Lynnell, Has had remarkable results with Taltz 

## 2024-06-07 NOTE — Assessment & Plan Note (Addendum)
 Well-managed on Entresto  and carvedilol . Blood pressure and heart rate controlled. - Continue Entresto  and carvedilol  as prescribed.,  Entresto  is a low dose

## 2024-06-07 NOTE — Assessment & Plan Note (Addendum)
 Well controlled with CPAP, great adherence.

## 2024-06-07 NOTE — Assessment & Plan Note (Signed)
 Well-managed on Entresto  and carvedilol . Blood pressure and heart rate controlled. - Continue Entresto  and carvedilol  as prescribed.

## 2024-06-07 NOTE — Assessment & Plan Note (Addendum)
 Managed on with crestor  10mg  daily, history of PFO closure and now also on long term A/C due to Afib

## 2024-06-07 NOTE — Assessment & Plan Note (Signed)
 Managed with suppressive antibiotics. - Continue suppressive antibiotics as prescribed.

## 2024-06-07 NOTE — Assessment & Plan Note (Signed)
 Managed with phelbotomy by Dr Sherrod. Previous lab tests negative for gene mutation. - Perform bone marrow biopsy on October 20th with Dr Sherrod

## 2024-06-12 ENCOUNTER — Other Ambulatory Visit: Payer: Self-pay

## 2024-06-13 ENCOUNTER — Encounter (HOSPITAL_COMMUNITY): Payer: Self-pay

## 2024-06-13 ENCOUNTER — Other Ambulatory Visit: Payer: Self-pay

## 2024-06-13 ENCOUNTER — Other Ambulatory Visit (HOSPITAL_COMMUNITY): Payer: Self-pay

## 2024-06-13 DIAGNOSIS — E1169 Type 2 diabetes mellitus with other specified complication: Secondary | ICD-10-CM

## 2024-06-13 MED ORDER — ROSUVASTATIN CALCIUM 10 MG PO TABS
10.0000 mg | ORAL_TABLET | Freq: Every day | ORAL | 1 refills | Status: AC
  Filled 2024-06-13: qty 90, 90d supply, fill #0
  Filled 2024-09-12: qty 90, 90d supply, fill #1

## 2024-06-14 ENCOUNTER — Inpatient Hospital Stay (HOSPITAL_BASED_OUTPATIENT_CLINIC_OR_DEPARTMENT_OTHER): Admitting: Internal Medicine

## 2024-06-14 ENCOUNTER — Inpatient Hospital Stay: Attending: Internal Medicine

## 2024-06-14 VITALS — BP 136/80 | HR 75 | Temp 98.0°F | Resp 17 | Ht 70.0 in | Wt 246.5 lb

## 2024-06-14 DIAGNOSIS — G4733 Obstructive sleep apnea (adult) (pediatric): Secondary | ICD-10-CM | POA: Diagnosis not present

## 2024-06-14 DIAGNOSIS — I5022 Chronic systolic (congestive) heart failure: Secondary | ICD-10-CM | POA: Insufficient documentation

## 2024-06-14 DIAGNOSIS — D45 Polycythemia vera: Secondary | ICD-10-CM

## 2024-06-14 DIAGNOSIS — D751 Secondary polycythemia: Secondary | ICD-10-CM | POA: Diagnosis not present

## 2024-06-14 DIAGNOSIS — Z96653 Presence of artificial knee joint, bilateral: Secondary | ICD-10-CM | POA: Insufficient documentation

## 2024-06-14 DIAGNOSIS — Z8673 Personal history of transient ischemic attack (TIA), and cerebral infarction without residual deficits: Secondary | ICD-10-CM | POA: Insufficient documentation

## 2024-06-14 DIAGNOSIS — I110 Hypertensive heart disease with heart failure: Secondary | ICD-10-CM | POA: Diagnosis not present

## 2024-06-14 LAB — CBC WITH DIFFERENTIAL (CANCER CENTER ONLY)
Abs Immature Granulocytes: 0.02 K/uL (ref 0.00–0.07)
Basophils Absolute: 0 K/uL (ref 0.0–0.1)
Basophils Relative: 0 %
Eosinophils Absolute: 0.2 K/uL (ref 0.0–0.5)
Eosinophils Relative: 2 %
HCT: 49.1 % (ref 39.0–52.0)
Hemoglobin: 16.8 g/dL (ref 13.0–17.0)
Immature Granulocytes: 0 %
Lymphocytes Relative: 30 %
Lymphs Abs: 2.3 K/uL (ref 0.7–4.0)
MCH: 26.7 pg (ref 26.0–34.0)
MCHC: 34.2 g/dL (ref 30.0–36.0)
MCV: 78.1 fL — ABNORMAL LOW (ref 80.0–100.0)
Monocytes Absolute: 0.6 K/uL (ref 0.1–1.0)
Monocytes Relative: 8 %
Neutro Abs: 4.6 K/uL (ref 1.7–7.7)
Neutrophils Relative %: 60 %
Platelet Count: 165 K/uL (ref 150–400)
RBC: 6.29 MIL/uL — ABNORMAL HIGH (ref 4.22–5.81)
RDW: 15.7 % — ABNORMAL HIGH (ref 11.5–15.5)
WBC Count: 7.8 K/uL (ref 4.0–10.5)
nRBC: 0 % (ref 0.0–0.2)

## 2024-06-14 LAB — LACTATE DEHYDROGENASE: LDH: 115 U/L (ref 98–192)

## 2024-06-14 NOTE — Progress Notes (Signed)
 [facility] [phone]   [phone]  OFFICE PROGRESS NOTE  [name], DO [address] 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 Negative MPL515 mutation, Negative BCR/ABL and negative JAK-2 exon 12.  PRIOR THERAPY: None  CURRENT THERAPY: Phlebotomy on as-needed basis.  INTERVAL HISTORY: [name]. 64 y.o. male returns to the clinic today for follow-up visit.Discussed the use of AI scribe software for clinical note transcription with the patient, who gave verbal consent to proceed.  History of Present Illness [name] is a 63 year old male with persistent polycythemia suspicious for polycythemia vera who presents for evaluation and repeat blood work. He is accompanied by his wife.  He has a history of persistent polycythemia, suspicious for polycythemia vera, with a negative JAK2 mutation test. His hemoglobin level today is 16.8, lower than previous readings of 18.7 in August and 17.2 in September, but still elevated. He is not currently taking aspirin. Bilirubin levels are pending, which are of interest to his gastroenterologist.  He has obstructive sleep apnea, which may contribute to his elevated red blood cell count. He uses a CPAP machine nightly and reports a recent oxygen  saturation score of 95 with 0.1 events per hour. A recent sleep test indicated adequate oxygen  levels.  He is preparing for his son's wedding and has family visiting from [place].    MEDICAL HISTORY: Past Medical History:  Diagnosis Date   Arthritis    Calculus of gallbladder with acute cholecystitis 11/23/2023   Cholangitis (HCC) 11/23/2023   Cholecystitis 11/29/2023   Chronic systolic heart failure (HCC) 09/27/2019   Diabetes mellitus without complication (HCC)    TYPE 2   Diverticulosis    Dizziness 11/11/2022   He was at his  baseline state of health until this Sunday, when he experienced dizziness at church. He felt off with blurred vision, and went to wait in the car, but was well enough to drive home. On Monday, he had a similar episode, and had to leave work early, which is very atypical. His dizziness is associated with fatigue and generally not feeling well. It comes and goes, but he hasn't identi   Dyslipidemia    Elevated bilirubin 11/23/2023   GERD (gastroesophageal reflux disease)    Herpes labialis    Hypertension    Longstanding persistent atrial fibrillation (HCC)    Metabolic syndrome    Obesity    Proteinuria    Psoriasis    S/P left TKA 06/28/2018   S/P TKR (total knee replacement), right 05/31/2018   Seborrheic dermatitis    Sleep apnea    very compliant with CPAP, PT NEEDS TO BRING OWN MACHINE   TIA (transient ischemic attack) 2009    ALLERGIES:  is allergic to morphine .  MEDICATIONS:  Current Outpatient Medications  Medication Sig Dispense Refill   acetaminophen  (TYLENOL ) 500 MG tablet Take 500 mg by mouth daily as needed for mild pain (pain score 1-3) or moderate pain (pain score 4-6).     Ascorbic Acid (VITAMIN C) 100 MG tablet Take 100 mg by mouth daily.     b complex vitamins capsule Take 1 capsule by mouth daily.     carvedilol  (COREG ) 12.5 MG tablet Take 1 tablet (12.5 mg total) by mouth 2 (two) times daily. 180 tablet 3   cefadroxil  (DURICEF) 500 MG capsule Take 1 capsule (500 mg total)  by mouth 2 (two) times daily. 60 capsule 11   empagliflozin  (JARDIANCE ) 10 MG TABS tablet Take 1 tablet (10 mg total) by mouth daily. 90 tablet 1   Multiple Vitamin (MULTIVITAMIN WITH MINERALS) TABS tablet Take 1 tablet by mouth daily.     mupirocin  ointment (BACTROBAN ) 2 % Apply to affected area twice a day. 22 g 0   Oxymetazoline  HCl (NASAL SPRAY) 0.05 % SOLN Place 1 spray into the nose at bedtime as needed (congestion).     pantoprazole  (PROTONIX ) 40 MG tablet Take 1 tablet (40 mg total) by  mouth daily. 180 tablet 0   rivaroxaban  (XARELTO ) 20 MG TABS tablet Take 1 tablet (20 mg total) by mouth daily with supper. 90 tablet 1   rosuvastatin  (CRESTOR ) 10 MG tablet Take 1 tablet (10 mg total) by mouth daily. 90 tablet 1   sacubitril -valsartan  (ENTRESTO ) 24-26 MG Take 1 tablet by mouth 2 (two) times daily. 180 tablet 1   TALTZ  80 MG/ML pen Inject 1 ml (80mg ) under the skin every 4 weeks. 1 mL 11   tirzepatide  (MOUNJARO ) 15 MG/0.5ML Pen Inject 15 mg into the skin once a week. 6 mL 3   Vitamin D , Cholecalciferol , 1000 units CAPS Take 1,000 Units by mouth daily.      No current facility-administered medications for this visit.    SURGICAL HISTORY:  Past Surgical History:  Procedure Laterality Date   BILIARY STENT PLACEMENT  11/25/2023   Procedure: INSERTION, STENT, BILE DUCT;  Surgeon: Avram Lupita BRAVO, MD;  Location: [facility] ENDOSCOPY;  Service: Gastroenterology;;   CARDIOVERSION N/A 12/02/2016   Procedure: CARDIOVERSION;  Surgeon: Vinie JAYSON Maxcy, MD;  Location: [facility] ENDOSCOPY;  Service: Cardiovascular;  Laterality: N/A;   CHOLECYSTECTOMY N/A 11/29/2023   Procedure: LAPAROSCOPIC CHOLECYSTECTOMY WITH INTRAOPERATIVE CHOLANGIOGRAM;  Surgeon: Dasie Leonor CROME, MD;  Location: [facility] OR;  Service: General;  Laterality: N/A;   COLONOSCOPY     ERCP N/A 11/25/2023   Procedure: ERCP, WITH INTERVENTION IF INDICATED;  Surgeon: Avram Lupita BRAVO, MD;  Location: [facility] ENDOSCOPY;  Service: Gastroenterology;  Laterality: N/A;  With stent placement   ERCP N/A 01/31/2024   Procedure: ERCP, WITH INTERVENTION IF INDICATED;  Surgeon: Wilhelmenia Aloha Raddle., MD;  Location: [facility] ENDOSCOPY;  Service: Gastroenterology;  Laterality: N/A;   ESOPHAGOGASTRODUODENOSCOPY N/A 11/25/2023   Procedure: EGD (ESOPHAGOGASTRODUODENOSCOPY);  Surgeon: Avram Lupita BRAVO, MD;  Location: [facility] ENDOSCOPY;  Service: Gastroenterology;  Laterality: N/A;   EXCISIONAL TOTAL KNEE ARTHROPLASTY WITH ANTIBIOTIC SPACERS Right 03/28/2019   Procedure: EXCISIONAL TOTAL  KNEE ARTHROPLASTY WITH ANTIBIOTIC SPACERS;  Surgeon: Ernie Cough, MD;  Location: [facility] ORS;  Service: Orthopedics;  Laterality: Right;  90 mins   I & D KNEE WITH POLY EXCHANGE Right 09/10/2018   Procedure: Right Knee Arthroplasty IRRIGATION AND DEBRIDEMENT KNEE WITH POLY EXCHANGE;  Surgeon: Ernie Cough, MD;  Location: [facility] ORS;  Service: Orthopedics;  Laterality: Right;   I & D KNEE WITH POLY EXCHANGE Right 06/25/2020   Procedure: Open excisional and non excisional debridement right knee, possible aspiration versus open arthrotomy poly exchange;  Surgeon: Ernie Cough, MD;  Location: [facility] ORS;  Service: Orthopedics;  Laterality: Right;   IRRIGATION AND DEBRIDEMENT KNEE Right 07/09/2020   Procedure: IRRIGATION AND DEBRIDEMENT KNEE;  Surgeon: Ernie Cough, MD;  Location: [facility] ORS;  Service: Orthopedics;  Laterality: Right;   JOINT REPLACEMENT  11/03/10   LT HIP   PFO occluder cardiac valve  2006   Dr. Ladona, (hole in heart)   REIMPLANTATION OF TOTAL KNEE Right  06/15/2019   Procedure: Resection of antibiotic spacer and  irrigation and debridment and placement of new antibiotic spacer and components;  Surgeon: Ernie Cough, MD;  Location: [facility] ORS;  Service: Orthopedics;  Laterality: Right;  90 mins   REIMPLANTATION OF TOTAL KNEE Right 08/22/2019   Procedure: REIMPLANTATION OF TOTAL KNEE;  Surgeon: Ernie Cough, MD;  Location: [facility] ORS;  Service: Orthopedics;  Laterality: Right;  120 mins   SKIN BIOPSY Left 12/22/2019   epidermal inclusion cyst   SPYGLASS CHOLANGIOSCOPY N/A 01/31/2024   Procedure: CHOLANGIOSCOPY, USING SPYGLASS;  Surgeon: Wilhelmenia Aloha Raddle., MD;  Location: [facility] ENDOSCOPY;  Service: Gastroenterology;  Laterality: N/A;   TONSILLECTOMY AND ADENOIDECTOMY     TOTAL HIP ARTHROPLASTY Left    TOTAL KNEE ARTHROPLASTY Right 05/31/2018   Procedure: RIGHT TOTAL KNEE ARTHROPLASTY;  Surgeon: Ernie Cough, MD;  Location: [facility] ORS;  Service: Orthopedics;  Laterality: Right;  70 mins   TOTAL KNEE  ARTHROPLASTY Left 06/28/2018   Procedure: LEFT TOTAL KNEE ARTHROPLASTY;  Surgeon: Ernie Cough, MD;  Location: [facility] ORS;  Service: Orthopedics;  Laterality: Left;  70 mins   WISDOM TOOTH EXTRACTION      REVIEW OF SYSTEMS:  A comprehensive review of systems was negative except for: Constitutional: positive for fatigue   PHYSICAL EXAMINATION: General appearance: alert, cooperative, fatigued, and no distress Head: Normocephalic, without obvious abnormality, atraumatic Neck: no adenopathy, no JVD, supple, symmetrical, trachea midline, and thyroid  not enlarged, symmetric, no tenderness/mass/nodules Lymph nodes: Cervical, supraclavicular, and axillary nodes normal. Resp: clear to auscultation bilaterally Back: symmetric, no curvature. ROM normal. No CVA tenderness. Cardio: regular rate and rhythm, S1, S2 normal, no murmur, click, rub or gallop GI: soft, non-tender; bowel sounds normal; no masses,  no organomegaly Extremities: extremities normal, atraumatic, no cyanosis or edema  ECOG PERFORMANCE STATUS: 1 - Symptomatic but completely ambulatory  Blood pressure 136/80, pulse 75, temperature 98 F (36.7 C), resp. rate 17, height 5' 10 (1.778 m), weight 246 lb 8 oz (111.8 kg), SpO2 99%.  LABORATORY DATA: Lab Results  Component Value Date   WBC 7.8 06/14/2024   HGB 16.8 06/14/2024   HCT 49.1 06/14/2024   MCV 78.1 (L) 06/14/2024   PLT 165 06/14/2024      Chemistry      Component Value Date/Time   NA 135 05/17/2024 1437   NA 133 (L) 01/10/2024 1053   NA 138 05/22/2015 1303   K 4.2 05/17/2024 1437   K 4.1 05/22/2015 1303   CL 104 05/17/2024 1437   CO2 24 05/17/2024 1437   CO2 23 05/22/2015 1303   BUN 18 05/17/2024 1437   BUN 11 01/10/2024 1053   BUN 17.4 05/22/2015 1303   CREATININE 1.00 05/17/2024 1437   CREATININE 1.07 03/13/2024 1028   CREATININE 1.1 05/22/2015 1303      Component Value Date/Time   CALCIUM  8.9 05/17/2024 1437   CALCIUM  9.4 05/22/2015 1303   ALKPHOS 66  05/17/2024 1437   ALKPHOS 73 05/22/2015 1303   AST 17 05/17/2024 1437   AST 24 05/22/2015 1303   ALT 20 05/17/2024 1437   ALT 30 05/22/2015 1303   BILITOT 1.2 05/17/2024 1437   BILITOT 0.77 05/22/2015 1303       RADIOGRAPHIC STUDIES: No results found.   ASSESSMENT AND PLAN: This is a very pleasant 63 years old white male with polycythemia highly suspicious for polycythemia vera but with negative JAK mutation.   He has been receiving phlebotomy on as-needed basis to keep his hematocrit between  45-50%. The patient is doing fine today with no concerning complaints except for mild fatigue. Hemoglobin is 16.8 hematocrit 49.1%. Assessment and Plan Assessment & Plan Polycythemia (suspected polycythemia vera vs secondary polycythemia) Persistent polycythemia with suspicion for polycythemia vera, but negative JAK2 mutation. Hemoglobin at 16.8, lower than previous readings but still high normal. Differential includes polycythemia vera and secondary causes such as obstructive sleep apnea, COPD, or heart conditions. Only 5% of cases may have polycythemia vera without a JAK2 mutation. - Perform bone marrow biopsy on Monday to investigate cause. - Consider hydroxyurea and aspirin if polycythemia vera is confirmed. - Use phlebotomy to manage hemoglobin levels if secondary polycythemia is confirmed, without strict targets unless hemoglobin exceeds 50. - Monitor hemoglobin levels and adjust phlebotomy frequency as needed. - Evaluate for secondary causes of polycythemia such as obstructive sleep apnea. The patient was advised to call immediately if he has any other concerning symptoms in the interval. The patient voices understanding of current disease status and treatment options and is in agreement with the current care plan. All questions were answered. The patient knows to call the clinic with any problems, questions or concerns. We can certainly see the patient much sooner if necessary. The total  time spent in the appointment was 20 minutes including review of chart and various tests results, discussions about plan of care and coordination of care plan . Disclaimer: This note was dictated with voice recognition software. Similar sounding words can inadvertently be transcribed and may not be corrected upon review.

## 2024-06-16 ENCOUNTER — Other Ambulatory Visit (HOSPITAL_COMMUNITY)

## 2024-06-16 NOTE — H&P (Signed)
 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 is a 63 y.o. 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 He is scheduled today for image guided bone marrow biopsy for further evaluation.   *** Patient is Full Code  Past Medical History:  Diagnosis Date   Arthritis    Calculus of gallbladder with acute cholecystitis 11/23/2023   Cholangitis (HCC) 11/23/2023   Cholecystitis 11/29/2023   Chronic systolic heart failure (HCC) 09/27/2019   Diabetes mellitus without complication (HCC)    TYPE 2   Diverticulosis    Dizziness 11/11/2022   He was at his baseline state of health until this Sunday, when he experienced dizziness at church. He felt off with blurred vision, and went to wait in the car, but was well enough to drive home. On Monday, he had a similar episode, and had to leave work early, which is very atypical. His dizziness is associated with fatigue and generally not feeling well. It comes and goes, but he hasn't identi   Dyslipidemia    Elevated bilirubin 11/23/2023   GERD (gastroesophageal reflux disease)    Herpes labialis    Hypertension    Longstanding persistent atrial fibrillation (HCC)    Metabolic syndrome    Obesity    Proteinuria    Psoriasis    S/P left TKA 06/28/2018   S/P TKR (total knee replacement), right 05/31/2018   Seborrheic dermatitis    Sleep apnea    very compliant with CPAP, PT NEEDS TO BRING OWN MACHINE   TIA (transient  ischemic attack) 2009    Past Surgical History:  Procedure Laterality Date   BILIARY STENT PLACEMENT  11/25/2023   Procedure: INSERTION, STENT, BILE DUCT;  Surgeon: Avram Lupita BRAVO, MD;  Location: [facility] ENDOSCOPY;  Service: Gastroenterology;;   CARDIOVERSION N/A 12/02/2016   Procedure: CARDIOVERSION;  Surgeon: Vinie JAYSON Maxcy, MD;  Location: [facility] ENDOSCOPY;  Service: Cardiovascular;  Laterality: N/A;   CHOLECYSTECTOMY N/A 11/29/2023   Procedure: LAPAROSCOPIC CHOLECYSTECTOMY WITH INTRAOPERATIVE CHOLANGIOGRAM;  Surgeon: Dasie Leonor CROME, MD;  Location: [facility] OR;  Service: General;  Laterality: N/A;   COLONOSCOPY     ERCP N/A 11/25/2023   Procedure: ERCP, WITH INTERVENTION IF INDICATED;  Surgeon: Avram Lupita BRAVO, MD;  Location: [facility] ENDOSCOPY;  Service: Gastroenterology;  Laterality: N/A;  With stent placement   ERCP N/A 01/31/2024   Procedure: ERCP, WITH INTERVENTION IF INDICATED;  Surgeon: Wilhelmenia Aloha Mickey., MD;  Location: [facility] ENDOSCOPY;  Service: Gastroenterology;  Laterality: N/A;   ESOPHAGOGASTRODUODENOSCOPY N/A 11/25/2023   Procedure: EGD (ESOPHAGOGASTRODUODENOSCOPY);  Surgeon: Avram Lupita BRAVO, MD;  Location: [facility] ENDOSCOPY;  Service: Gastroenterology;  Laterality: N/A;   EXCISIONAL TOTAL KNEE ARTHROPLASTY WITH ANTIBIOTIC SPACERS Right 03/28/2019   Procedure: EXCISIONAL TOTAL KNEE ARTHROPLASTY WITH ANTIBIOTIC SPACERS;  Surgeon: Ernie Cough, MD;  Location: [facility] ORS;  Service: Orthopedics;  Laterality: Right;  90 mins   I & D KNEE WITH POLY EXCHANGE Right 09/10/2018   Procedure: Right Knee Arthroplasty IRRIGATION AND DEBRIDEMENT KNEE WITH POLY EXCHANGE;  Surgeon: Ernie Cough, MD;  Location: [facility] ORS;  Service: Orthopedics;  Laterality: Right;   I & D KNEE WITH POLY EXCHANGE Right 06/25/2020   Procedure: Open excisional and non excisional debridement right knee, possible aspiration versus open arthrotomy poly exchange;  Surgeon: Ernie Cough, MD;  Location: [facility] ORS;  Service: Orthopedics;  Laterality: Right;    IRRIGATION AND DEBRIDEMENT KNEE Right 07/09/2020   Procedure: IRRIGATION AND DEBRIDEMENT KNEE;  Surgeon: Ernie Cough, MD;  Location: [facility] ORS;  Service: Orthopedics;  Laterality: Right;   JOINT REPLACEMENT  11/03/10   LT HIP   PFO occluder cardiac valve  2006   Dr. Ladona, (hole in heart)   REIMPLANTATION OF TOTAL KNEE Right 06/15/2019   Procedure: Resection of antibiotic spacer and  irrigation and debridment and placement of new antibiotic spacer and components;  Surgeon: Ernie Cough, MD;  Location: [facility] ORS;  Service: Orthopedics;  Laterality: Right;  90 mins   REIMPLANTATION OF TOTAL KNEE Right 08/22/2019   Procedure: REIMPLANTATION OF TOTAL KNEE;  Surgeon: Ernie Cough, MD;  Location: [facility] ORS;  Service: Orthopedics;  Laterality: Right;  120 mins   SKIN BIOPSY Left 12/22/2019   epidermal inclusion cyst   SPYGLASS CHOLANGIOSCOPY N/A 01/31/2024   Procedure: CHOLANGIOSCOPY, USING SPYGLASS;  Surgeon: Wilhelmenia Aloha Raddle., MD;  Location: [facility] ENDOSCOPY;  Service: Gastroenterology;  Laterality: N/A;   TONSILLECTOMY AND ADENOIDECTOMY     TOTAL HIP ARTHROPLASTY Left    TOTAL KNEE ARTHROPLASTY Right 05/31/2018   Procedure: RIGHT TOTAL KNEE ARTHROPLASTY;  Surgeon: Ernie Cough, MD;  Location: [facility] ORS;  Service: Orthopedics;  Laterality: Right;  70 mins   TOTAL KNEE ARTHROPLASTY Left 06/28/2018   Procedure: LEFT TOTAL KNEE ARTHROPLASTY;  Surgeon: Ernie Cough, MD;  Location: [facility]S;  Service: Orthopedics;  Laterality: Left;  70 mins   WISDOM TOOTH EXTRACTION      Allergies: Morphine   Medications: Prior to Admission medications   Medication Sig Start Date End Date Taking? Authorizing Provider  acetaminophen  (TYLENOL ) 500 MG tablet Take 500 mg by mouth daily as needed for mild pain (pain score 1-3) or moderate pain (pain score 4-6).    [provider]  Ascorbic Acid (VITAMIN C) 100 MG tablet Take 100 mg by mouth daily.    [provider]  b complex vitamins capsule Take 1 capsule by  mouth daily.    [provider]  carvedilol  (COREG ) 12.5 MG tablet Take 1 tablet (12.5 mg total) by mouth 2 (two) times daily. 02/02/24 02/01/25  Lovie Clarity, MD  cefadroxil  (DURICEF) 500 MG capsule Take 1 capsule (500 mg total) by mouth 2 (two) times daily. 09/13/23   Luiz Channel, MD  empagliflozin  (JARDIANCE ) 10 MG TABS tablet Take 1 tablet (10 mg total) by mouth daily. 05/11/24   [name], DO  Multiple Vitamin (MULTIVITAMIN WITH MINERALS) TABS tablet Take 1 tablet by mouth daily.    [provider]  mupirocin  ointment (BACTROBAN ) 2 % Apply to affected area twice a day. 12/24/23     Oxymetazoline  HCl (NASAL SPRAY) 0.05 % SOLN Place 1 spray into the nose at bedtime as needed (congestion).    [provider]  pantoprazole  (PROTONIX ) 40 MG tablet Take 1 tablet (40 mg total) by mouth daily. 03/21/24   [name], DO  rivaroxaban  (XARELTO ) 20 MG TABS tablet Take 1 tablet (20 mg total) by mouth daily with supper. 02/01/24   [name], Aleene PARAS, MD  rosuvastatin  (CRESTOR ) 10 MG tablet Take 1 tablet (10 mg total) by mouth daily. 06/13/24   [name], DO  sacubitril -valsartan  (ENTRESTO ) 24-26 MG Take 1 tablet by mouth 2 (two) times daily. 05/11/24   [name], DO  TALTZ  80 MG/ML pen Inject 1 ml (80mg ) under the skin every 4 weeks. 10/07/23   Jegede, Olugbemiga E, MD  tirzepatide  (MOUNJARO ) 15 MG/0.5ML Pen Inject 15 mg into the skin once a week. 06/05/24   [name], DO  Vitamin D , Cholecalciferol , 1000 units CAPS Take 1,000 Units by mouth daily.     [provider]     Family History  Problem Relation Age of Onset   Uterine cancer Mother 60       Cervical cath   Crohn's disease Mother    Esophageal cancer Father    Cancer Other    Obesity Other    Colon cancer Neg Hx    Inflammatory bowel disease Neg Hx    Liver disease Neg Hx    Pancreatic cancer Neg Hx    Rectal cancer Neg Hx    Stomach cancer Neg Hx     Social History    Socioeconomic History   Marital status: Married    Spouse name: Not on file   Number of children: 2   Years of education: Not on file   Highest education level: Bachelor's degree (e.g., BA, AB, BS)  Occupational History   Occupation: Information systems manager: [employer]  Tobacco Use   Smoking status: Never   Smokeless tobacco: Never  Vaping Use   Vaping status: Never Used  Substance and Sexual Activity   Alcohol  use: Yes    Comment: rarely   Drug use: No   Sexual activity: Yes  Other Topics Concern   Not on file  Social History Narrative   Lives with spouse in [place]   Wife works for [employer] teaching program   Owns Social worker   Social Drivers of Health   Financial Resource Strain: Low Risk  (05/29/2024)   Overall Financial Resource Strain (CARDIA)    Difficulty of Paying Living Expenses: Not very hard  Food Insecurity: No Food Insecurity (05/29/2024)   Hunger Vital Sign    Worried About Running Out of Food in the Last Year: Never true    Ran Out of Food in the Last Year: Never true  Transportation Needs: No Transportation Needs (05/29/2024)   PRAPARE - Administrator, Civil Service (Medical): No    Lack of Transportation (Non-Medical): No  Physical Activity: Insufficiently Active (05/29/2024)   Exercise Vital Sign    Days of Exercise per Week: 2 days    Minutes of Exercise per Session: 60 min  Stress: Stress Concern Present (05/29/2024)   [employer] of Occupational Health - Occupational Stress Questionnaire    Feeling of Stress: To some extent  Social Connections: Socially Integrated (05/29/2024)   Social Connection and Isolation Panel    Frequency of Communication with Friends and Family: More than three times a week    Frequency of Social Gatherings with Friends and Family: Twice a week    Attends Religious Services: More than 4 times per year    Active Member of [employer] or Organizations: Yes    Attends Museum/gallery exhibitions officer: More than 4 times per year    Marital Status: Married      Review of Systems  Vital Signs:   Advance Care Plan: no documents on file  Physical Exam  Imaging: No results found.  Labs:  CBC: Recent Labs  03/13/24 1028 04/11/24 1028 05/17/24 1437 06/14/24 1427  WBC 6.1 7.8 6.6 7.8  HGB 18.5* 18.7 Repeated and verified X2.* 17.2* 16.8  HCT 56.6* 55.7* 50.0 49.1  PLT 188 187.0 152 165    COAGS: Recent Labs    11/23/23 1041 11/24/23 0608 01/05/24 1055 02/15/24 0847  INR 1.6* 1.3* 2.1* 2.2*  APTT  --   --  44*  --     BMP: Recent Labs    11/27/23 0531 11/30/23 0630 12/06/23 0923 01/05/24 0924 01/10/24 1053 02/15/24 0847 03/13/24 1028 04/11/24 1028 05/17/24 1437  NA 137 134*   < > 139   < > 139 137 135 135  K 3.8 4.1   < > 5.0   < > 4.3 4.3 4.3 4.2  CL 103 97*   < > 100   < > 103 102 100 104  CO2 24 25   < > 29   < > 27 26 26 24   GLUCOSE 94 144*   < > 103*   < > 118* 105* 98 110*  BUN 11 14   < > 13   < > 17 15 18 18   CALCIUM  8.6* 9.2   < > 10.3   < > 9.9 9.5 9.5 8.9  CREATININE 1.16 1.18   < > 1.42*   < > 1.11 1.07 1.07 1.00  GFRNONAA >60 >60  --  55*  --   --   --   --  >60   < > = values in this interval not displayed.    LIVER FUNCTION TESTS: Recent Labs    01/31/24 0853 02/15/24 0847 04/11/24 1028 05/17/24 1437  BILITOT 2.3* 1.4* 1.5* 1.2  AST 32 18 24 17   ALT 25 20 25 20   ALKPHOS 55 63 58 66  PROT 6.3* 6.9 7.0 6.6  ALBUMIN  3.6 4.2 4.3 4.1    TUMOR MARKERS: No results for input(s): AFPTM, CEA, CA199, CHROMGRNA in the last 8760 hours.  Assessment and Plan: 63 y.o. 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 He is scheduled today for image guided bone  marrow biopsy for further evaluation. Risks and benefits of procedure was discussed with the patient  including, but not limited to bleeding, infection, damage to adjacent structures or low yield requiring additional tests.  All of the questions were answered and there is agreement to proceed.  Consent signed and in chart.    Thank you for allowing our service to participate in William D Cirelli Jr. 's care.  Electronically Signed: D. Franky Rakers, PA-C   06/16/2024, 6:04 PM      I spent a total of  20 minutes   in face to face in clinical consultation, greater than 50% of which was counseling/coordinating care for image guided bone marrow biopsy

## 2024-06-17 ENCOUNTER — Encounter (INDEPENDENT_AMBULATORY_CARE_PROVIDER_SITE_OTHER): Payer: Self-pay

## 2024-06-19 ENCOUNTER — Other Ambulatory Visit: Payer: Self-pay

## 2024-06-19 ENCOUNTER — Encounter (HOSPITAL_COMMUNITY): Payer: Self-pay

## 2024-06-19 ENCOUNTER — Ambulatory Visit (HOSPITAL_COMMUNITY)
Admission: RE | Admit: 2024-06-19 | Discharge: 2024-06-19 | Disposition: A | Discharge status: Home / Self Care | Source: Ambulatory Visit | Attending: Internal Medicine | Admitting: Internal Medicine

## 2024-06-19 ENCOUNTER — Ambulatory Visit (HOSPITAL_COMMUNITY)
Admission: RE | Admit: 2024-06-19 | Discharge: 2024-06-19 | Disposition: A | Source: Ambulatory Visit | Attending: Internal Medicine | Admitting: Internal Medicine

## 2024-06-19 VITALS — BP 146/106 | HR 62 | Temp 97.2°F | Resp 20

## 2024-06-19 DIAGNOSIS — Z01818 Encounter for other preprocedural examination: Secondary | ICD-10-CM | POA: Diagnosis not present

## 2024-06-19 DIAGNOSIS — D751 Secondary polycythemia: Secondary | ICD-10-CM | POA: Diagnosis not present

## 2024-06-19 DIAGNOSIS — D45 Polycythemia vera: Secondary | ICD-10-CM | POA: Insufficient documentation

## 2024-06-19 DIAGNOSIS — D7589 Other specified diseases of blood and blood-forming organs: Secondary | ICD-10-CM | POA: Diagnosis not present

## 2024-06-19 LAB — CBC
HCT: 50.3 % (ref 39.0–52.0)
Hemoglobin: 16.4 g/dL (ref 13.0–17.0)
MCH: 25.9 pg — ABNORMAL LOW (ref 26.0–34.0)
MCHC: 32.6 g/dL (ref 30.0–36.0)
MCV: 79.6 fL — ABNORMAL LOW (ref 80.0–100.0)
Platelets: 168 K/uL (ref 150–400)
RBC: 6.32 MIL/uL — ABNORMAL HIGH (ref 4.22–5.81)
RDW: 16 % — ABNORMAL HIGH (ref 11.5–15.5)
WBC: 5.7 K/uL (ref 4.0–10.5)
nRBC: 0 % (ref 0.0–0.2)

## 2024-06-19 LAB — GLUCOSE, CAPILLARY: Glucose-Capillary: 112 mg/dL — ABNORMAL HIGH (ref 70–99)

## 2024-06-19 MED ORDER — MIDAZOLAM HCL 2 MG/2ML IJ SOLN
INTRAMUSCULAR | Status: AC
  Filled 2024-06-19: qty 4

## 2024-06-19 MED ORDER — SODIUM CHLORIDE 0.9 % IV SOLN
INTRAVENOUS | Status: DC
Start: 2024-06-19 — End: 2024-06-20

## 2024-06-19 MED ORDER — FENTANYL CITRATE (PF) 100 MCG/2ML IJ SOLN
INTRAMUSCULAR | Status: AC
  Filled 2024-06-19: qty 2

## 2024-06-19 MED ORDER — MIDAZOLAM HCL (PF) 2 MG/2ML IJ SOLN
INTRAMUSCULAR | Status: AC | PRN
  Administered 2024-06-19: 2 mg via INTRAVENOUS
  Administered 2024-06-19: 1 mg via INTRAVENOUS

## 2024-06-19 MED ORDER — FENTANYL CITRATE (PF) 100 MCG/2ML IJ SOLN
INTRAMUSCULAR | Status: AC | PRN
  Administered 2024-06-19: 50 ug via INTRAVENOUS

## 2024-06-19 NOTE — Procedures (Signed)
 Interventional Radiology Procedure Note  Procedure: CT Guided Biopsy of bone marrow  Complications: None  Estimated Blood Loss: < 10 mL  Findings: 13 G core biopsy of bone marrow performed under CT guidance.  Aspirate and  core samples obtained and sent to Pathology.  [name] Banner, MD

## 2024-06-19 NOTE — Discharge Instructions (Signed)

## 2024-06-19 NOTE — Progress Notes (Signed)
 Specialty Pharmacy Refill Coordination Note  William Belt. is a 63 y.o. male contacted today regarding refills of specialty medication(s) Ixekizumab  (Taltz )   Patient requested [name] at [facility] [place] date: 06/22/24   Medication will be filled on 10.22.25.

## 2024-06-20 ENCOUNTER — Other Ambulatory Visit: Payer: Self-pay

## 2024-06-21 ENCOUNTER — Telehealth: Payer: Self-pay | Admitting: Gastroenterology

## 2024-06-21 LAB — SURGICAL PATHOLOGY

## 2024-06-21 NOTE — Telephone Encounter (Signed)
 Procedure:ERCP/Endoscopy Procedure date: 06/29/24 Procedure location: [facility] Arrival Time: 6:30 am Spoke with the patient Y/N: Yes Any prep concerns? No  Has the patient obtained the prep from the pharmacy ? No prep needed Do you have a care partner and transportation: Yes Any additional concerns? No

## 2024-06-22 ENCOUNTER — Encounter (HOSPITAL_COMMUNITY): Payer: Self-pay | Admitting: Gastroenterology

## 2024-06-26 ENCOUNTER — Encounter: Payer: Self-pay | Admitting: Internal Medicine

## 2024-06-26 DIAGNOSIS — L821 Other seborrheic keratosis: Secondary | ICD-10-CM | POA: Diagnosis not present

## 2024-06-26 DIAGNOSIS — L814 Other melanin hyperpigmentation: Secondary | ICD-10-CM | POA: Diagnosis not present

## 2024-06-26 DIAGNOSIS — L400 Psoriasis vulgaris: Secondary | ICD-10-CM | POA: Diagnosis not present

## 2024-06-26 DIAGNOSIS — Z85828 Personal history of other malignant neoplasm of skin: Secondary | ICD-10-CM | POA: Diagnosis not present

## 2024-06-28 ENCOUNTER — Encounter (HOSPITAL_COMMUNITY): Payer: Self-pay | Admitting: Internal Medicine

## 2024-06-28 NOTE — Anesthesia Preprocedure Evaluation (Addendum)
 Anesthesia Evaluation  Patient identified by MRN, date of birth, ID band Patient awake    Reviewed: Allergy & Precautions, NPO status , Patient's Chart, lab work & pertinent test results, reviewed documented beta blocker date and time   History of Anesthesia Complications Negative for: history of anesthetic complications  Airway Mallampati: II  TM Distance: >3 FB Neck ROM: Full    Dental no notable dental hx.    Pulmonary sleep apnea and Continuous Positive Airway Pressure Ventilation    Pulmonary exam normal        Cardiovascular hypertension, Pt. on medications and Pt. on home beta blockers Normal cardiovascular exam+ dysrhythmias Atrial Fibrillation   Echo 2023  1. Left ventricular ejection fraction, by estimation, is 55 to 60%. The  left ventricle has normal function. The left ventricle has no regional  wall motion abnormalities. There is moderate left ventricular hypertrophy.  Left ventricular diastolic  parameters are indeterminate.   2. Right ventricular systolic function is normal. The right ventricular  size is mildly enlarged.   3. Left atrial size was mildly dilated.   4. PFO closure.   5. Right atrial size was mildly dilated.   6. The mitral valve is normal in structure. No evidence of mitral valve  regurgitation. No evidence of mitral stenosis.   7. The aortic valve is tricuspid. There is mild calcification of the  aortic valve. Aortic valve regurgitation is not visualized. No aortic  stenosis is present.   8. Aortic dilatation noted. There is mild dilatation of the ascending  aorta, measuring 41 mm.   9. The inferior vena cava is normal in size with greater than 50%  respiratory variability, suggesting right atrial pressure of 3 mmHg.   Stress test 2023 PVCs increased in frequency with exercise, with intermittent couplets. This may affect interpretation of wall motion/EF. Mild fixed defect at apex consistent  with apical thinning, no ischemia noted.    Neuro/Psych TIA (2009) negative psych ROS   GI/Hepatic ,GERD  Medicated and Controlled,,(+) Cirrhosis       Hx polyps, biliary stent   Endo/Other  diabetes, Type 2, Oral Hypoglycemic Agents  BMI 35  Renal/GU negative Renal ROS  negative genitourinary   Musculoskeletal  (+) Arthritis , Osteoarthritis,    Abdominal   Peds  Hematology negative hematology ROS (+) Hb 16.4, plt 168 No INR this admission   Anesthesia Other Findings Mounjaro  LD:   Reproductive/Obstetrics negative OB ROS                              Anesthesia Physical Anesthesia Plan  ASA: 3  Anesthesia Plan: General   Post-op Pain Management: Minimal or no pain anticipated   Induction: Intravenous  PONV Risk Score and Plan: 2 and Ondansetron , Dexamethasone  and Treatment may vary due to age or medical condition  Airway Management Planned: Oral ETT  Additional Equipment: None  Intra-op Plan:   Post-operative Plan: Extubation in OR  Informed Consent: I have reviewed the patients History and Physical, chart, labs and discussed the procedure including the risks, benefits and alternatives for the proposed anesthesia with the patient or authorized representative who has indicated his/her understanding and acceptance.     Dental advisory given  Plan Discussed with: CRNA  Anesthesia Plan Comments:          Anesthesia Quick Evaluation

## 2024-06-29 ENCOUNTER — Encounter (HOSPITAL_COMMUNITY)
Admission: RE | Disposition: A | Discharge status: Home / Self Care | Payer: Self-pay | Source: Home / Self Care | Attending: Gastroenterology

## 2024-06-29 ENCOUNTER — Ambulatory Visit (HOSPITAL_COMMUNITY)

## 2024-06-29 ENCOUNTER — Other Ambulatory Visit: Payer: Self-pay

## 2024-06-29 ENCOUNTER — Encounter (HOSPITAL_COMMUNITY): Payer: Self-pay | Admitting: Gastroenterology

## 2024-06-29 ENCOUNTER — Encounter (HOSPITAL_COMMUNITY): Payer: Self-pay | Admitting: Anesthesiology

## 2024-06-29 ENCOUNTER — Ambulatory Visit (HOSPITAL_COMMUNITY)
Admission: RE | Admit: 2024-06-29 | Discharge: 2024-06-29 | Disposition: A | Discharge status: Home / Self Care | Attending: Gastroenterology | Admitting: Gastroenterology

## 2024-06-29 ENCOUNTER — Ambulatory Visit (HOSPITAL_COMMUNITY): Payer: Self-pay | Admitting: Anesthesiology

## 2024-06-29 ENCOUNTER — Telehealth: Payer: Self-pay

## 2024-06-29 DIAGNOSIS — G4730 Sleep apnea, unspecified: Secondary | ICD-10-CM | POA: Insufficient documentation

## 2024-06-29 DIAGNOSIS — I130 Hypertensive heart and chronic kidney disease with heart failure and stage 1 through stage 4 chronic kidney disease, or unspecified chronic kidney disease: Secondary | ICD-10-CM | POA: Diagnosis not present

## 2024-06-29 DIAGNOSIS — E119 Type 2 diabetes mellitus without complications: Secondary | ICD-10-CM

## 2024-06-29 DIAGNOSIS — Z9889 Other specified postprocedural states: Secondary | ICD-10-CM

## 2024-06-29 DIAGNOSIS — K8050 Calculus of bile duct without cholangitis or cholecystitis without obstruction: Secondary | ICD-10-CM

## 2024-06-29 DIAGNOSIS — I110 Hypertensive heart disease with heart failure: Secondary | ICD-10-CM | POA: Diagnosis not present

## 2024-06-29 DIAGNOSIS — T85590A Other mechanical complication of bile duct prosthesis, initial encounter: Secondary | ICD-10-CM | POA: Diagnosis not present

## 2024-06-29 DIAGNOSIS — T85590D Other mechanical complication of bile duct prosthesis, subsequent encounter: Secondary | ICD-10-CM | POA: Diagnosis not present

## 2024-06-29 DIAGNOSIS — I4811 Longstanding persistent atrial fibrillation: Secondary | ICD-10-CM | POA: Insufficient documentation

## 2024-06-29 DIAGNOSIS — K8040 Calculus of bile duct with cholecystitis, unspecified, without obstruction: Secondary | ICD-10-CM | POA: Insufficient documentation

## 2024-06-29 DIAGNOSIS — X58XXXA Exposure to other specified factors, initial encounter: Secondary | ICD-10-CM | POA: Insufficient documentation

## 2024-06-29 DIAGNOSIS — E1122 Type 2 diabetes mellitus with diabetic chronic kidney disease: Secondary | ICD-10-CM | POA: Diagnosis not present

## 2024-06-29 DIAGNOSIS — I509 Heart failure, unspecified: Secondary | ICD-10-CM | POA: Diagnosis not present

## 2024-06-29 DIAGNOSIS — Z79899 Other long term (current) drug therapy: Secondary | ICD-10-CM | POA: Diagnosis not present

## 2024-06-29 DIAGNOSIS — Z4659 Encounter for fitting and adjustment of other gastrointestinal appliance and device: Secondary | ICD-10-CM

## 2024-06-29 DIAGNOSIS — Z7984 Long term (current) use of oral hypoglycemic drugs: Secondary | ICD-10-CM | POA: Insufficient documentation

## 2024-06-29 DIAGNOSIS — I5022 Chronic systolic (congestive) heart failure: Secondary | ICD-10-CM | POA: Diagnosis not present

## 2024-06-29 DIAGNOSIS — K7469 Other cirrhosis of liver: Secondary | ICD-10-CM

## 2024-06-29 DIAGNOSIS — Z860101 Personal history of adenomatous and serrated colon polyps: Secondary | ICD-10-CM

## 2024-06-29 DIAGNOSIS — I10 Essential (primary) hypertension: Secondary | ICD-10-CM

## 2024-06-29 DIAGNOSIS — Z8673 Personal history of transient ischemic attack (TIA), and cerebral infarction without residual deficits: Secondary | ICD-10-CM | POA: Diagnosis not present

## 2024-06-29 DIAGNOSIS — R142 Eructation: Secondary | ICD-10-CM

## 2024-06-29 DIAGNOSIS — N181 Chronic kidney disease, stage 1: Secondary | ICD-10-CM | POA: Diagnosis not present

## 2024-06-29 DIAGNOSIS — K219 Gastro-esophageal reflux disease without esophagitis: Secondary | ICD-10-CM | POA: Insufficient documentation

## 2024-06-29 HISTORY — PX: ERCP: SHX5425

## 2024-06-29 LAB — GLUCOSE, CAPILLARY: Glucose-Capillary: 107 mg/dL — ABNORMAL HIGH (ref 70–99)

## 2024-06-29 SURGERY — ERCP, WITH INTERVENTION IF INDICATED
Anesthesia: General

## 2024-06-29 MED ORDER — ROCURONIUM BROMIDE 10 MG/ML (PF) SYRINGE
PREFILLED_SYRINGE | INTRAVENOUS | Status: DC | PRN
  Administered 2024-06-29: 60 mg via INTRAVENOUS

## 2024-06-29 MED ORDER — DEXAMETHASONE SOD PHOSPHATE PF 10 MG/ML IJ SOLN
INTRAMUSCULAR | Status: DC | PRN
  Administered 2024-06-29: 5 mg via INTRAVENOUS

## 2024-06-29 MED ORDER — FENTANYL CITRATE (PF) 100 MCG/2ML IJ SOLN
INTRAMUSCULAR | Status: AC
  Filled 2024-06-29: qty 2

## 2024-06-29 MED ORDER — SODIUM CHLORIDE 0.9 % IV SOLN
INTRAVENOUS | Status: DC | PRN
  Administered 2024-06-29: 20 mL

## 2024-06-29 MED ORDER — PROPOFOL 10 MG/ML IV BOLUS
INTRAVENOUS | Status: DC | PRN
  Administered 2024-06-29: 200 mg via INTRAVENOUS

## 2024-06-29 MED ORDER — GLUCAGON HCL RDNA (DIAGNOSTIC) 1 MG IJ SOLR
INTRAMUSCULAR | Status: DC | PRN
  Administered 2024-06-29: .25 mg via INTRAVENOUS

## 2024-06-29 MED ORDER — LACTATED RINGERS IV SOLN: INTRAVENOUS | Status: DC | PRN

## 2024-06-29 MED ORDER — PROPOFOL 10 MG/ML IV BOLUS
INTRAVENOUS | Status: AC
  Filled 2024-06-29: qty 20

## 2024-06-29 MED ORDER — SUGAMMADEX SODIUM 200 MG/2ML IV SOLN
INTRAVENOUS | Status: DC | PRN
  Administered 2024-06-29: 200 mg via INTRAVENOUS
  Administered 2024-06-29: 100 mg via INTRAVENOUS

## 2024-06-29 MED ORDER — CIPROFLOXACIN IN D5W 400 MG/200ML IV SOLN
INTRAVENOUS | Status: AC
  Filled 2024-06-29: qty 200

## 2024-06-29 MED ORDER — FENTANYL CITRATE (PF) 250 MCG/5ML IJ SOLN
INTRAMUSCULAR | Status: DC | PRN
  Administered 2024-06-29 (×2): 50 ug via INTRAVENOUS

## 2024-06-29 MED ORDER — SODIUM CHLORIDE 0.9 % IV SOLN: INTRAVENOUS | Status: DC

## 2024-06-29 MED ORDER — PHENYLEPHRINE 80 MCG/ML (10ML) SYRINGE FOR IV PUSH (FOR BLOOD PRESSURE SUPPORT)
PREFILLED_SYRINGE | INTRAVENOUS | Status: DC | PRN
  Administered 2024-06-29 (×2): 160 ug via INTRAVENOUS

## 2024-06-29 MED ORDER — LIDOCAINE 2% (20 MG/ML) 5 ML SYRINGE
INTRAMUSCULAR | Status: DC | PRN
  Administered 2024-06-29: 100 mg via INTRAVENOUS

## 2024-06-29 MED ORDER — GLUCAGON HCL RDNA (DIAGNOSTIC) 1 MG IJ SOLR
INTRAMUSCULAR | Status: AC
Start: 2024-06-29 — End: 2024-06-29
  Filled 2024-06-29: qty 1

## 2024-06-29 MED ORDER — MIDAZOLAM HCL 2 MG/2ML IJ SOLN
INTRAMUSCULAR | Status: AC
  Filled 2024-06-29: qty 2

## 2024-06-29 MED ORDER — DICLOFENAC SUPPOSITORY 100 MG
RECTAL | Status: AC
Start: 2024-06-29 — End: 2024-06-29
  Filled 2024-06-29: qty 1

## 2024-06-29 MED ORDER — CIPROFLOXACIN IN D5W 400 MG/200ML IV SOLN
400.0000 mg | Freq: Once | INTRAVENOUS | Status: AC
  Administered 2024-06-29: 400 mg via INTRAVENOUS

## 2024-06-29 MED ORDER — DICLOFENAC SUPPOSITORY 100 MG
RECTAL | Status: DC | PRN
Start: 2024-06-29 — End: 2024-06-29
  Administered 2024-06-29: 100 mg via RECTAL

## 2024-06-29 MED ORDER — MIDAZOLAM HCL (PF) 2 MG/2ML IJ SOLN
INTRAMUSCULAR | Status: DC | PRN
  Administered 2024-06-29: 2 mg via INTRAVENOUS

## 2024-06-29 MED ORDER — EPHEDRINE SULFATE-NACL 50-0.9 MG/10ML-% IV SOSY
PREFILLED_SYRINGE | INTRAVENOUS | Status: DC | PRN
  Administered 2024-06-29 (×2): 5 mg via INTRAVENOUS

## 2024-06-29 MED ORDER — ONDANSETRON HCL 4 MG/2ML IJ SOLN
INTRAMUSCULAR | Status: DC | PRN
  Administered 2024-06-29: 4 mg via INTRAVENOUS

## 2024-06-29 NOTE — H&P (Signed)
 GASTROENTEROLOGY PROCEDURE H&P NOTE   Primary Care Physician: [name], DO  HPI: [name]. is a 63 y.o. male who presents for ERCP for attempt at biliary stent removal from previous embedment.  Past Medical History:  Diagnosis Date   Arthritis    Calculus of gallbladder with acute cholecystitis 11/23/2023   Cholangitis (HCC) 11/23/2023   Cholecystitis 11/29/2023   Chronic systolic heart failure (HCC) 09/27/2019   Diabetes mellitus without complication (HCC)    TYPE 2   Diverticulosis    Dizziness 11/11/2022   He was at his baseline state of health until this Sunday, when he experienced dizziness at church. He felt off with blurred vision, and went to wait in the car, but was well enough to drive home. On Monday, he had a similar episode, and had to leave work early, which is very atypical. His dizziness is associated with fatigue and generally not feeling well. It comes and goes, but he hasn't identi   Dyslipidemia    Elevated bilirubin 11/23/2023   GERD (gastroesophageal reflux disease)    Herpes labialis    Hypertension    Longstanding persistent atrial fibrillation (HCC)    Metabolic syndrome    Obesity    Proteinuria    Psoriasis    S/P left TKA 06/28/2018   S/P TKR (total knee replacement), right 05/31/2018   Seborrheic dermatitis    Sleep apnea    very compliant with CPAP, PT NEEDS TO BRING OWN MACHINE   TIA (transient ischemic attack) 2009   Past Surgical History:  Procedure Laterality Date   BILIARY STENT PLACEMENT  11/25/2023   Procedure: INSERTION, STENT, BILE DUCT;  Surgeon: Avram Lupita BRAVO, MD;  Location: [facility] ENDOSCOPY;  Service: Gastroenterology;;   CARDIOVERSION N/A 12/02/2016   Procedure: CARDIOVERSION;  Surgeon: Vinie BROCKS Maxcy, MD;  Location: [facility] ENDOSCOPY;  Service: Cardiovascular;  Laterality: N/A;   CHOLECYSTECTOMY N/A 11/29/2023   Procedure: LAPAROSCOPIC CHOLECYSTECTOMY WITH INTRAOPERATIVE CHOLANGIOGRAM;  Surgeon: Dasie Leonor CROME, MD;   Location: [facility] OR;  Service: General;  Laterality: N/A;   COLONOSCOPY     ERCP N/A 11/25/2023   Procedure: ERCP, WITH INTERVENTION IF INDICATED;  Surgeon: Avram Lupita BRAVO, MD;  Location: [facility] ENDOSCOPY;  Service: Gastroenterology;  Laterality: N/A;  With stent placement   ERCP N/A 01/31/2024   Procedure: ERCP, WITH INTERVENTION IF INDICATED;  Surgeon: Wilhelmenia Aloha Mickey., MD;  Location: [facility] ENDOSCOPY;  Service: Gastroenterology;  Laterality: N/A;   ESOPHAGOGASTRODUODENOSCOPY N/A 11/25/2023   Procedure: EGD (ESOPHAGOGASTRODUODENOSCOPY);  Surgeon: Avram Lupita BRAVO, MD;  Location: [facility] ENDOSCOPY;  Service: Gastroenterology;  Laterality: N/A;   EXCISIONAL TOTAL KNEE ARTHROPLASTY WITH ANTIBIOTIC SPACERS Right 03/28/2019   Procedure: EXCISIONAL TOTAL KNEE ARTHROPLASTY WITH ANTIBIOTIC SPACERS;  Surgeon: Ernie Cough, MD;  Location: [facility] ORS;  Service: Orthopedics;  Laterality: Right;  90 mins   I & D KNEE WITH POLY EXCHANGE Right 09/10/2018   Procedure: Right Knee Arthroplasty IRRIGATION AND DEBRIDEMENT KNEE WITH POLY EXCHANGE;  Surgeon: Ernie Cough, MD;  Location: [facility] ORS;  Service: Orthopedics;  Laterality: Right;   I & D KNEE WITH POLY EXCHANGE Right 06/25/2020   Procedure: Open excisional and non excisional debridement right knee, possible aspiration versus open arthrotomy poly exchange;  Surgeon: Ernie Cough, MD;  Location: [facility] ORS;  Service: Orthopedics;  Laterality: Right;   IRRIGATION AND DEBRIDEMENT KNEE Right 07/09/2020   Procedure: IRRIGATION AND DEBRIDEMENT KNEE;  Surgeon: Ernie Cough, MD;  Location: [facility] ORS;  Service: Orthopedics;  Laterality: Right;  JOINT REPLACEMENT  11/03/10   LT HIP   PFO occluder cardiac valve  2006   Dr. Ladona, (hole in heart)   REIMPLANTATION OF TOTAL KNEE Right 06/15/2019   Procedure: Resection of antibiotic spacer and  irrigation and debridment and placement of new antibiotic spacer and components;  Surgeon: Ernie Cough, MD;  Location: [facility] ORS;  Service: Orthopedics;   Laterality: Right;  90 mins   REIMPLANTATION OF TOTAL KNEE Right 08/22/2019   Procedure: REIMPLANTATION OF TOTAL KNEE;  Surgeon: Ernie Cough, MD;  Location: [facility] ORS;  Service: Orthopedics;  Laterality: Right;  120 mins   SKIN BIOPSY Left 12/22/2019   epidermal inclusion cyst   SPYGLASS CHOLANGIOSCOPY N/A 01/31/2024   Procedure: CHOLANGIOSCOPY, USING SPYGLASS;  Surgeon: Wilhelmenia Aloha Raddle., MD;  Location: [facility] ENDOSCOPY;  Service: Gastroenterology;  Laterality: N/A;   TONSILLECTOMY AND ADENOIDECTOMY     TOTAL HIP ARTHROPLASTY Left    TOTAL KNEE ARTHROPLASTY Right 05/31/2018   Procedure: RIGHT TOTAL KNEE ARTHROPLASTY;  Surgeon: Ernie Cough, MD;  Location: [facility] ORS;  Service: Orthopedics;  Laterality: Right;  70 mins   TOTAL KNEE ARTHROPLASTY Left 06/28/2018   Procedure: LEFT TOTAL KNEE ARTHROPLASTY;  Surgeon: Ernie Cough, MD;  Location: [facility] ORS;  Service: Orthopedics;  Laterality: Left;  70 mins   WISDOM TOOTH EXTRACTION     Current Facility-Administered Medications  Medication Dose Route Frequency Provider Last Rate Last Admin   0.9 %  sodium chloride  infusion   Intravenous Continuous Mansouraty, Aloha Raddle., MD        Current Facility-Administered Medications:    0.9 %  sodium chloride  infusion, , Intravenous, Continuous, Mansouraty, Aloha Raddle., MD Allergies  Allergen Reactions   Morphine  Other (See Comments)   Family History  Problem Relation Age of Onset   Uterine cancer Mother 52       Cervical cath   Crohn's disease Mother    Esophageal cancer Father    Cancer Other    Obesity Other    Colon cancer Neg Hx    Inflammatory bowel disease Neg Hx    Liver disease Neg Hx    Pancreatic cancer Neg Hx    Rectal cancer Neg Hx    Stomach cancer Neg Hx    Social History   Socioeconomic History   Marital status: Married    Spouse name: Not on file   Number of children: 2   Years of education: Not on file   Highest education level: Bachelor's degree (e.g., BA, AB, BS)   Occupational History   Occupation: Information Systems Manager: [employer]  Tobacco Use   Smoking status: Never   Smokeless tobacco: Never  Vaping Use   Vaping status: Never Used  Substance and Sexual Activity   Alcohol  use: Yes    Comment: rarely   Drug use: No   Sexual activity: Yes  Other Topics Concern   Not on file  Social History Narrative   Lives with spouse in [place]   Wife works for [employer] teaching [employer]   Owns Social Worker   Social Drivers of Health   Financial Resource Strain: Low Risk  (05/29/2024)   Overall Financial Resource Strain (CARDIA)    Difficulty of Paying Living Expenses: Not very hard  Food Insecurity: No Food Insecurity (05/29/2024)   Hunger Vital Sign    Worried About Running Out of Food in the Last Year: Never true    Ran Out of Food in the Last Year: Never true  Transportation Needs: No Transportation Needs (05/29/2024)   PRAPARE - Administrator, Civil Service (Medical): No    Lack of Transportation (Non-Medical): No  Physical Activity: Insufficiently Active (05/29/2024)   Exercise Vital Sign    Days of Exercise per Week: 2 days    Minutes of Exercise per Session: 60 min  Stress: Stress Concern Present (05/29/2024)   [employer] of Occupational Health - Occupational Stress Questionnaire    Feeling of Stress: To some extent  Social Connections: Socially Integrated (05/29/2024)   Social Connection and Isolation Panel    Frequency of Communication with Friends and Family: More than three times a week    Frequency of Social Gatherings with Friends and Family: Twice a week    Attends Religious Services: More than 4 times per year    Active Member of [employer] or Organizations: Yes    Attends Banker Meetings: More than 4 times per year    Marital Status: Married  Catering Manager Violence: Not At Risk (11/29/2023)   Humiliation, Afraid, Rape, and Kick questionnaire    Fear of Current or  Ex-Partner: No    Emotionally Abused: No    Physically Abused: No    Sexually Abused: No    Physical Exam: Today's Vitals   06/29/24 0653  BP: (!) 111/59  Pulse: (!) 58  Resp: (!) 21  Temp: (!) 97 F (36.1 C)  TempSrc: Temporal  SpO2: 94%  Weight: 106.1 kg  Height: 5' 10 (1.778 m)  PainSc: 0-No pain   Body mass index is 33.58 kg/m. GEN: NAD EYE: Sclerae anicteric ENT: MMM CV: Non-tachycardic GI: Soft, NT/ND NEURO:  Alert & Oriented x 3  Lab Results: No results for input(s): WBC, HGB, HCT, PLT in the last 72 hours. BMET No results for input(s): NA, K, CL, CO2, GLUCOSE, BUN, CREATININE, CALCIUM  in the last 72 hours. LFT No results for input(s): PROT, ALBUMIN , AST, ALT, ALKPHOS, BILITOT, BILIDIR, IBILI in the last 72 hours. PT/INR No results for input(s): LABPROT, INR in the last 72 hours.   Impression / Plan: This is a 63 y.o.male who presents for ERCP for attempt at biliary stent removal from previous embedment.  The risks of an ERCP were discussed at length, including but not limited to the risk of perforation, bleeding, abdominal pain, post-ERCP pancreatitis (while usually mild can be severe and even life threatening).   The risks and benefits of endoscopic evaluation/treatment were discussed with the patient and/or family; these include but are not limited to the risk of perforation, infection, bleeding, missed lesions, lack of diagnosis, severe illness requiring hospitalization, as well as anesthesia and sedation related illnesses.  The patient's history has been reviewed, patient examined, no change in status, and deemed stable for procedure.  The patient and/or family is agreeable to proceed.    Aloha Finner, MD [facility] Office # [phone]

## 2024-06-29 NOTE — Anesthesia Postprocedure Evaluation (Signed)
 Anesthesia Post Note  Patient: William D Seide Jr.  Procedure(s) Performed: ERCP, WITH INTERVENTION IF INDICATED     Patient location during evaluation: PACU Anesthesia Type: General Level of consciousness: awake and alert Pain management: pain level controlled Vital Signs Assessment: post-procedure vital signs reviewed and stable Respiratory status: spontaneous breathing, nonlabored ventilation and respiratory function stable Cardiovascular status: blood pressure returned to baseline Postop Assessment: no apparent nausea or vomiting Anesthetic complications: no   No notable events documented.  Last Vitals:  Vitals:   06/29/24 0929 06/29/24 0930  BP: 115/69 115/69  Pulse: (!) 58 (!) 137  Resp: (!) 9 13  Temp:    SpO2: 96% 94%    Last Pain:  Vitals:   06/29/24 0929  TempSrc:   PainSc: 0-No pain                 Vertell Row

## 2024-06-29 NOTE — Telephone Encounter (Signed)
 Reviewed previous note with patient , no further actions needed. Patient advised understanding.

## 2024-06-29 NOTE — Op Note (Signed)
 [facility] Patient Name: [name] Procedure Date: 06/29/2024 MRN: [id number] Attending MD: Aloha Finner , MD, [id number] Date of Birth: 09-01-1960 CSN: [id number] Age: 63 Admit Type: Outpatient Procedure:                ERCP Indications:              Biliary stent removal Providers:                Aloha Finner, MD, Jacquelyn Jaci Pierce,                            RN, [facility] [place], Technician, Nena PARAS. Dasie                            CRNA, CRNA Referring MD:              Medicines:                General Anesthesia, Cipro  400 mg IV, Diclofenac  100                            mg rectal, Glucagon  0.5 mg IV Complications:            No immediate complications. Estimated Blood Loss:     Estimated blood loss was minimal. Procedure:                Pre-Anesthesia Assessment:                           - Prior to the procedure, a History and Physical                            was performed, and patient medications and                            allergies were reviewed. The patient's tolerance of                            previous anesthesia was also reviewed. The risks                            and benefits of the procedure and the sedation                            options and risks were discussed with the patient.                            All questions were answered, and informed consent                            was obtained. Prior Anticoagulants: The patient has                            taken Xarelto  (rivaroxaban ), last dose was 2 days  prior to procedure. ASA Grade Assessment: III - A                            patient with severe systemic disease. After                            reviewing the risks and benefits, the patient was                            deemed in satisfactory condition to undergo the                            procedure.                           After obtaining informed consent, the scope  was                            passed under direct vision. Throughout the                            procedure, the patient's blood pressure, pulse, and                            oxygen  saturations were monitored continuously. The                            TJF-Q190V ([id number] Olympus duodenoscope was                            introduced through the mouth, and used to inject                            contrast into and used to inject contrast into the                            bile duct. The ERCP was accomplished without                            difficulty. The patient tolerated the procedure. Scope In: Scope Out: Findings:      Two biliary stents were visible on the scout film.      The esophagus was successfully intubated under direct vision without       detailed examination of the pharynx, larynx, and associated structures,       and upper GI tract. One plastic biliary stent originating in the biliary       tree was emerging from the major papilla. The stent was partially       occluded. The other previously embedded biliary stent was still not seen       emerging from the major papilla. A biliary sphincterotomy had been       performed. The sphincterotomy appeared open.      One stent was removed from the biliary tree using a Raptor grasping       device. Unfortunately removal of the stent did  not allow the previously       embedded stent to be visualized or moved into a better position.      A 0.035 inch x 260 cm straight Hydra Jagwire was passed into the biliary       tree. The traction (standard) sphincterotome was passed over the       guidewire and the bile duct was then deeply cannulated. Contrast was       injected. I personally interpreted the bile duct images. Ductal flow of       contrast was adequate. Image quality was adequate. Contrast extended to       the hepatic ducts. The main bile duct contained one plastic biliary       stent (embedded). The main bile duct  contained filling defect(s) thought       to be sludge. To discover objects, the biliary tree was swept with a       retrieval balloon. Sludge was swept from the duct. A few stone fragments       were removed. No stones remained. The previously embedded plastic       biliary stent was finally able to be moved into the duodenum partially.       After removing my wire, then this stent was removed from the biliary       tree using a Raptor grasping device.      A 0.035 inch x 260 cm straight Hydra Jagwire was passed into the biliary       tree. The short-nosed traction sphincterotome was passed over the       guidewire and the bile duct was then deeply cannulated. Contrast was       injected. To discover objects, the biliary tree was swept with a       retrieval balloon. An occlusion cholangiogram was performed that showed       no further significant biliary pathology.      A pancreatogram was not performed.      The duodenoscope was withdrawn from the patient. Impression:               - One partially occluded stent from the biliary                            tree was seen in the major papilla. The previously                            embedded biliary stent was not visible (still in                            the duct).                           - Prior biliary sphincterotomy appeared open.                           - The fluoroscopic examination was suspicious for                            sludge. Presence of embedded biliary stent.                           -  Choledocholithiasis and biliary sludge was found.                            Complete removal was accomplished by sweeping.                           - Finally, the previously embedded biliary stent                            was removed from the biliary tree (see above). Moderate Sedation:      Not Applicable - Patient had care per Anesthesia. Recommendation:           - The patient will be observed post-procedure,                             until all discharge criteria are met.                           - Discharge patient to home.                           - Patient has a contact number available for                            emergencies. The signs and symptoms of potential                            delayed complications were discussed with the                            patient. Return to normal activities tomorrow.                            Written discharge instructions were provided to the                            patient.                           - Low fat diet.                           - May restart Xarelto  on 10/31 AM                           - Continue present medications.                           - Watch for pancreatitis, bleeding, perforation,                            and cholangitis.                           - The findings and recommendations were discussed  with the patient.                           - The findings and recommendations were discussed                            with the patient's family. Procedure Code(s):        --- Professional ---                           [phone], Endoscopic retrograde                            cholangiopancreatography (ERCP); with removal of                            foreign body(s) or stent(s) from biliary/pancreatic                            duct(s)                           43264, Endoscopic retrograde                            cholangiopancreatography (ERCP); with removal of                            calculi/debris from biliary/pancreatic duct(s)                           25671, Endoscopic catheterization of the biliary                            ductal system, radiological supervision and                            interpretation Diagnosis Code(s):        --- Professional ---                           U14.409J, Other mechanical complication of bile                            duct prosthesis, initial encounter                            K80.50, Calculus of bile duct without cholangitis                            or cholecystitis without obstruction                           Z46.59, Encounter for fitting and adjustment of                            other gastrointestinal appliance and device CPT copyright 2022 American Medical Association. All rights reserved. The  codes documented in this report are preliminary and upon coder review may  be revised to meet current compliance requirements. Aloha Finner, MD 06/29/2024 9:03:21 AM Number of Addenda: 0

## 2024-06-29 NOTE — OR Nursing (Signed)
 Patient had a skin abrasion left cheek when CRNA removed tape when extubating patient from ERCP. Also upper lip little swollen and small cut on inner lip. Patient and wife aware, will put bacitracin at home. Small amount of lotion put on prior to discharge.

## 2024-06-29 NOTE — Transfer of Care (Signed)
 Immediate Anesthesia Transfer of Care Note  Patient: William D Erion Jr.  Procedure(s) Performed: ERCP, WITH INTERVENTION IF INDICATED  Patient Location: PACU and Endoscopy Unit  Anesthesia Type:General  Level of Consciousness: awake, drowsy, patient cooperative, and responds to stimulation  Airway & Oxygen  Therapy: Patient Spontanous Breathing and Patient connected to face mask oxygen   Post-op Assessment: Report given to RN and Post -op Vital signs reviewed and stable  Post vital signs: Reviewed and stable  Last Vitals:  Vitals Value Taken Time  BP 108/62 06/29/24 09:00  Temp    Pulse 61 06/29/24 09:03  Resp 17 06/29/24 09:03  SpO2 99 % 06/29/24 09:03  Vitals shown include unfiled device data.  Last Pain:  Vitals:   06/29/24 0653  TempSrc: Temporal  PainSc: 0-No pain         Complications: No notable events documented.

## 2024-06-29 NOTE — Telephone Encounter (Signed)
 Patient returning call. Please advise

## 2024-06-29 NOTE — Telephone Encounter (Signed)
-----   Message from [facility] sent at 06/29/2024  9:14 AM EDT ----- Regarding: Followup [name], This patient will need a CBC/CMP/INR in 2 to 4 weeks. Set up a follow-up in clinic with one of the APP's or myself. We will discuss his colon cancer screening and colon polyp surveillance which was put on the back burner. Thanks. GM

## 2024-06-29 NOTE — Discharge Instructions (Signed)

## 2024-06-29 NOTE — Telephone Encounter (Signed)
 Lab order has been entered  Appt made for 07/17/24 at 10 am with Camie   Left message on machine to call back   Message also sent to My Chart he does view messages

## 2024-06-29 NOTE — Anesthesia Procedure Notes (Signed)
 Procedure Name: Intubation Date/Time: 06/29/2024 8:09 AM  Performed by: Dasie Nena PARAS, CRNAPre-anesthesia Checklist: Patient identified, Emergency Drugs available, Suction available, Patient being monitored and Timeout performed Patient Re-evaluated:Patient Re-evaluated prior to induction Oxygen  Delivery Method: Circle system utilized Preoxygenation: Pre-oxygenation with 100% oxygen  Induction Type: IV induction Ventilation: Mask ventilation without difficulty Laryngoscope Size: Miller and 3 Grade View: Grade II Tube type: Oral Tube size: 7.5 mm Number of attempts: 1 Airway Equipment and Method: Stylet Placement Confirmation: ETT inserted through vocal cords under direct vision, positive ETCO2 and breath sounds checked- equal and bilateral Secured at: 23 cm Tube secured with: Tape Dental Injury: Teeth and Oropharynx as per pre-operative assessment

## 2024-07-03 ENCOUNTER — Encounter: Payer: Self-pay | Admitting: Radiology

## 2024-07-05 ENCOUNTER — Ambulatory Visit: Admitting: Internal Medicine

## 2024-07-06 ENCOUNTER — Ambulatory Visit: Admitting: *Deleted

## 2024-07-06 DIAGNOSIS — Z23 Encounter for immunization: Secondary | ICD-10-CM | POA: Diagnosis not present

## 2024-07-06 DIAGNOSIS — E669 Obesity, unspecified: Secondary | ICD-10-CM

## 2024-07-07 NOTE — Progress Notes (Signed)
 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 Negative [id number] mutation, Negative BCR/ABL and negative JAK-2 exon 12.   PRIOR THERAPY: None  CURRENT THERAPY: Phlebotomy on as-needed basis.   INTERVAL HISTORY: William D Fortenberry Jr. 63 y.o. male returns to the clinic today for a follow-up visit.  The patient was last seen in the clinic by Dr. Sherrod on 06/14/2024.  He is followed for polycythemia suspicious for polycythemia vera despite having a negative mutation panel.   He was last seen by Dr. Sherrod a month ago. The patient also has sleep apnea and uses a CPAP machine.  He denies any unusual headaches, flushing, itching, dizziness, lightheadedness. The patient is taking 81 mg aspirin.  Dr. Sherrod recommended a bone marrow biopsy and aspirate to further assess his polycythemia.   If polycythemia vera is confirmed Dr. Sherrod would recommend starting him on hydroxyurea. If the patient has polycythemia vera, he would recommend keeping hematocrit around 45.    He tolerated the bone marrow biopsy and aspirate well.   He notes that his energy is all right.  Denies any abnormal bleeding or bruising.  Denies any flushing.  Denies any pruritus.  Denies any abdominal discomfort or fullness.  Denies any headache or dizziness. Denies any chest discomfort. Is here today for evaluation, repeat blood work, and consideration of therapeutic phlebotomy.   MEDICAL HISTORY: Past Medical History:  Diagnosis Date   Arthritis    Calculus of gallbladder with acute cholecystitis 11/23/2023   Cholangitis (HCC) 11/23/2023   Cholecystitis 11/29/2023   Chronic systolic heart failure (HCC) 09/27/2019   Diabetes mellitus without complication (HCC)    TYPE 2   Diverticulosis    Dizziness 11/11/2022   He was at his baseline state of health until this Sunday, when he experienced  dizziness at church. He felt off with blurred vision, and went to wait in the car, but was well enough to drive home. On Monday, he had a similar episode, and had to leave work early, which is very atypical. His dizziness is associated with fatigue and generally not feeling well. It comes and goes, but he hasn't identi   Dyslipidemia    Elevated bilirubin 11/23/2023   GERD (gastroesophageal reflux disease)    Herpes labialis    Hypertension    Longstanding persistent atrial fibrillation (HCC)    Metabolic syndrome    Obesity    Proteinuria    Psoriasis    S/P left TKA 06/28/2018   S/P TKR (total knee replacement), right 05/31/2018   Seborrheic dermatitis    Sleep apnea    very compliant with CPAP, PT NEEDS TO BRING OWN MACHINE   TIA (transient ischemic attack) 2009    ALLERGIES:  is allergic to morphine .  MEDICATIONS:  Current Outpatient Medications  Medication Sig Dispense Refill   acetaminophen  (TYLENOL ) 500 MG tablet Take 500 mg by mouth daily as needed for mild pain (pain score 1-3) or moderate pain (pain score 4-6).     Ascorbic Acid (VITAMIN C) 100 MG tablet Take 100 mg by mouth daily.     b complex vitamins capsule Take 1 capsule by mouth daily.     carvedilol  (COREG ) 12.5 MG tablet Take 1 tablet (12.5 mg total) by mouth 2 (two) times daily. 180 tablet 3   cefadroxil  (DURICEF) 500 MG capsule Take 1 capsule (500 mg total) by mouth 2 (two) times  daily. 60 capsule 11   empagliflozin  (JARDIANCE ) 10 MG TABS tablet Take 1 tablet (10 mg total) by mouth daily. 90 tablet 1   Multiple Vitamin (MULTIVITAMIN WITH MINERALS) TABS tablet Take 1 tablet by mouth daily.     mupirocin  ointment (BACTROBAN ) 2 % Apply to affected area twice a day. 22 g 0   Oxymetazoline  HCl (NASAL SPRAY) 0.05 % SOLN Place 1 spray into the nose at bedtime as needed (congestion).     pantoprazole  (PROTONIX ) 40 MG tablet Take 1 tablet (40 mg total) by mouth daily. 180 tablet 0   rivaroxaban  (XARELTO ) 20 MG TABS  tablet Take 1 tablet (20 mg total) by mouth daily with supper. 90 tablet 1   rosuvastatin  (CRESTOR ) 10 MG tablet Take 1 tablet (10 mg total) by mouth daily. 90 tablet 1   sacubitril -valsartan  (ENTRESTO ) 24-26 MG Take 1 tablet by mouth 2 (two) times daily. 180 tablet 1   TALTZ  80 MG/ML pen Inject 1 ml (80mg ) under the skin every 4 weeks. 1 mL 11   tirzepatide  (MOUNJARO ) 15 MG/0.5ML Pen Inject 15 mg into the skin once a week. 6 mL 3   Vitamin D , Cholecalciferol , 1000 units CAPS Take 1,000 Units by mouth daily.      No current facility-administered medications for this visit.    SURGICAL HISTORY:  Past Surgical History:  Procedure Laterality Date   BILIARY STENT PLACEMENT  11/25/2023   Procedure: INSERTION, STENT, BILE DUCT;  Surgeon: Avram Lupita BRAVO, MD;  Location: [facility] ENDOSCOPY;  Service: Gastroenterology;;   CARDIOVERSION N/A 12/02/2016   Procedure: CARDIOVERSION;  Surgeon: Vinie JAYSON Maxcy, MD;  Location: [facility] ENDOSCOPY;  Service: Cardiovascular;  Laterality: N/A;   CHOLECYSTECTOMY N/A 11/29/2023   Procedure: LAPAROSCOPIC CHOLECYSTECTOMY WITH INTRAOPERATIVE CHOLANGIOGRAM;  Surgeon: Dasie Leonor CROME, MD;  Location: [facility] OR;  Service: General;  Laterality: N/A;   COLONOSCOPY     ERCP N/A 11/25/2023   Procedure: ERCP, WITH INTERVENTION IF INDICATED;  Surgeon: Avram Lupita BRAVO, MD;  Location: [facility] ENDOSCOPY;  Service: Gastroenterology;  Laterality: N/A;  With stent placement   ERCP N/A 01/31/2024   Procedure: ERCP, WITH INTERVENTION IF INDICATED;  Surgeon: Wilhelmenia Aloha Raddle., MD;  Location: [facility] ENDOSCOPY;  Service: Gastroenterology;  Laterality: N/A;   ERCP N/A 06/29/2024   Procedure: ERCP, WITH INTERVENTION IF INDICATED;  Surgeon: Wilhelmenia Aloha Raddle., MD;  Location: [facility] ENDOSCOPY;  Service: Gastroenterology;  Laterality: N/A;   ESOPHAGOGASTRODUODENOSCOPY N/A 11/25/2023   Procedure: EGD (ESOPHAGOGASTRODUODENOSCOPY);  Surgeon: Avram Lupita BRAVO, MD;  Location: [facility] ENDOSCOPY;  Service: Gastroenterology;   Laterality: N/A;   EXCISIONAL TOTAL KNEE ARTHROPLASTY WITH ANTIBIOTIC SPACERS Right 03/28/2019   Procedure: EXCISIONAL TOTAL KNEE ARTHROPLASTY WITH ANTIBIOTIC SPACERS;  Surgeon: Ernie Cough, MD;  Location: [facility] ORS;  Service: Orthopedics;  Laterality: Right;  90 mins   I & D KNEE WITH POLY EXCHANGE Right 09/10/2018   Procedure: Right Knee Arthroplasty IRRIGATION AND DEBRIDEMENT KNEE WITH POLY EXCHANGE;  Surgeon: Ernie Cough, MD;  Location: [facility] ORS;  Service: Orthopedics;  Laterality: Right;   I & D KNEE WITH POLY EXCHANGE Right 06/25/2020   Procedure: Open excisional and non excisional debridement right knee, possible aspiration versus open arthrotomy poly exchange;  Surgeon: Ernie Cough, MD;  Location: [facility] ORS;  Service: Orthopedics;  Laterality: Right;   IRRIGATION AND DEBRIDEMENT KNEE Right 07/09/2020   Procedure: IRRIGATION AND DEBRIDEMENT KNEE;  Surgeon: Ernie Cough, MD;  Location: [facility] ORS;  Service: Orthopedics;  Laterality: Right;   JOINT REPLACEMENT  11/03/10  LT HIP   PFO occluder cardiac valve  2006   Dr. Ladona, (hole in heart)   REIMPLANTATION OF TOTAL KNEE Right 06/15/2019   Procedure: Resection of antibiotic spacer and  irrigation and debridment and placement of new antibiotic spacer and components;  Surgeon: Ernie Cough, MD;  Location: [facility] ORS;  Service: Orthopedics;  Laterality: Right;  90 mins   REIMPLANTATION OF TOTAL KNEE Right 08/22/2019   Procedure: REIMPLANTATION OF TOTAL KNEE;  Surgeon: Ernie Cough, MD;  Location: [facility] ORS;  Service: Orthopedics;  Laterality: Right;  120 mins   SKIN BIOPSY Left 12/22/2019   epidermal inclusion cyst   SPYGLASS CHOLANGIOSCOPY N/A 01/31/2024   Procedure: CHOLANGIOSCOPY, USING SPYGLASS;  Surgeon: Wilhelmenia Aloha Raddle., MD;  Location: [facility] ENDOSCOPY;  Service: Gastroenterology;  Laterality: N/A;   TONSILLECTOMY AND ADENOIDECTOMY     TOTAL HIP ARTHROPLASTY Left    TOTAL KNEE ARTHROPLASTY Right 05/31/2018   Procedure: RIGHT TOTAL KNEE  ARTHROPLASTY;  Surgeon: Ernie Cough, MD;  Location: [facility] ORS;  Service: Orthopedics;  Laterality: Right;  70 mins   TOTAL KNEE ARTHROPLASTY Left 06/28/2018   Procedure: LEFT TOTAL KNEE ARTHROPLASTY;  Surgeon: Ernie Cough, MD;  Location: [facility] ORS;  Service: Orthopedics;  Laterality: Left;  70 mins   WISDOM TOOTH EXTRACTION      REVIEW OF SYSTEMS:   Review of Systems  Constitutional: Negative for appetite change, chills, fatigue, fever and unexpected weight change.  HENT: Negative for mouth sores, nosebleeds, sore throat and trouble swallowing.   Eyes: Negative for eye problems and icterus.  Respiratory: Negative for cough, hemoptysis, shortness of breath and wheezing.   Cardiovascular: Negative for chest pain and leg swelling.  Gastrointestinal: Negative for abdominal pain, constipation, diarrhea, nausea and vomiting.  Genitourinary: Negative for bladder incontinence, difficulty urinating, dysuria, frequency and hematuria.   Musculoskeletal: Negative for back pain, gait problem, neck pain and neck stiffness.  Skin: Negative for itching and rash.  Neurological: Negative for dizziness, extremity weakness, gait problem, headaches, light-headedness and seizures.  Hematological: Negative for adenopathy. Does not bruise/bleed easily.  Psychiatric/Behavioral: Negative for confusion, depression and sleep disturbance. The patient is not nervous/anxious.     PHYSICAL EXAMINATION:  Blood pressure 114/73, pulse 67, temperature 98 F (36.7 C), temperature source Temporal, resp. rate 16, height 5' 10 (1.778 m), weight 247 lb (112 kg), SpO2 97%.  ECOG PERFORMANCE STATUS: 0  Physical Exam  Constitutional: Oriented to person, place, and time and well-developed, well-nourished, and in no distress. HENT:  Head: Normocephalic and atraumatic.  Mouth/Throat: Oropharynx is clear and moist. No oropharyngeal exudate.  Eyes: Conjunctivae are normal. Right eye exhibits no discharge. Left eye exhibits no  discharge. No scleral icterus.  Neck: Normal range of motion. Neck supple.  Cardiovascular: Normal rate, regular rhythm, normal heart sounds and intact distal pulses.   Pulmonary/Chest: Effort normal and breath sounds normal. No respiratory distress. No wheezes. No rales.  Abdominal: Soft. Bowel sounds are normal. Exhibits no distension and no mass. There is no tenderness.  Musculoskeletal: Normal range of motion. Exhibits no edema.  Lymphadenopathy:    No cervical adenopathy.  Neurological: Alert and oriented to person, place, and time. Exhibits normal muscle tone. Gait normal. Coordination normal.  Skin: Skin is warm and dry. No rash noted. Not diaphoretic. No erythema. No pallor.  Psychiatric: Mood, memory and judgment normal.  Vitals reviewed.  LABORATORY DATA: Lab Results  Component Value Date   WBC 8.1 07/11/2024   HGB 17.2 (H) 07/11/2024   HCT 49.7 07/11/2024  MCV 76.2 (L) 07/11/2024   PLT 159 07/11/2024      Chemistry      Component Value Date/Time   NA 134 (L) 07/11/2024 1459   NA 133 (L) 01/10/2024 1053   NA 138 05/22/2015 1303   K 4.3 07/11/2024 1459   K 4.1 05/22/2015 1303   CL 106 07/11/2024 1459   CO2 21 (L) 07/11/2024 1459   CO2 23 05/22/2015 1303   BUN 15 07/11/2024 1459   BUN 11 01/10/2024 1053   BUN 17.4 05/22/2015 1303   CREATININE 1.27 (H) 07/11/2024 1459   CREATININE 1.07 03/13/2024 1028   CREATININE 1.1 05/22/2015 1303      Component Value Date/Time   CALCIUM  9.2 07/11/2024 1459   CALCIUM  9.4 05/22/2015 1303   ALKPHOS 58 07/11/2024 1459   ALKPHOS 73 05/22/2015 1303   AST 22 07/11/2024 1459   AST 24 05/22/2015 1303   ALT 21 07/11/2024 1459   ALT 30 05/22/2015 1303   BILITOT 1.1 07/11/2024 1459   BILITOT 0.77 05/22/2015 1303       RADIOGRAPHIC STUDIES:  DG ERCP Result Date: 06/29/2024 CLINICAL DATA:  [id number] Surgery, elective [id number] EXAM: ERCP COMPARISON:  ERCP, 01/31/2024.  CT AP, 11/23/2023. FLUOROSCOPY: Exposure Index (as provided by  the fluoroscopic device): 29.8 mGy Kerma FINDINGS: Limited oblique planar images of the RIGHT upper quadrant obtained C-arm. Images demonstrating flexible endoscopy, biliary duct cannulation, retrograde cholangiogram and balloon sweep. An indwelling plastic biliary stent is present, and removed at the beginning of the case. No biliary ductal dilation. No evidence of biliary filling defect is demonstrated. IMPRESSION: Fluoroscopic imaging for ERCP and plastic biliary stent removal. For complete description of intra procedural findings, please see performing service dictation. Electronically Signed   By: Thom Hall M.D.   On: 06/29/2024 09:18   CT BONE MARROW BIOPSY & ASPIRATION Result Date: 06/19/2024 CLINICAL DATA:  Persistent polycythemia. EXAM: CT-guided bone marrow biopsy TECHNIQUE: CT pelvis CONTRAST:  None RADIOPHARMACEUTICALS:  None COMPARISON:  None FINDINGS: The patient was placed in prone position on the CT gantry. Radiopaque markers were placed on the patient's skin and initial imaging of the pelvis was performed. The patient's skin was then prepped and draped in the usual sterile fashion. Moderate sedation was provided for by the nursing staff under my supervision utilizing intravenous Versed  and fentanyl . The nurse had no other duties other than monitoring the patient and providing sedation during the procedure. I was present for the entire procedure. Three mg intravenous Versed  and 100 mcg intravenous fentanyl  for a total sedation time of 10 minutes 1% lidocaine  was used to infiltrate the skin at the access site prior to a stab incision. Local anesthesia was then used to infiltrate the region of soft tissue from the skin to the right iliac bone. The bone marrow needle was then advanced and imaging demonstrated the needle tip to be in the cortex of the right iliac bone. The bone was then penetrated and a sample was obtained. After the sample was evaluated, approximately 5 mL of heparinized bone  marrow sample was obtained by aspiration. A core sample was then obtained. Multiple attempts at sampling was performed in order to get 2 1 cm segments. All needles were then removed from the patient. Sterile dressing was applied. IMPRESSION: Satisfactory core needle biopsy and aspiration of the right iliac bone marrow under CT guidance. Electronically Signed   By: Cordella Banner   On: 06/19/2024 11:11     ASSESSMENT/PLAN:  This is a  very pleasant 63 year old Caucasian male with secondary polycythemia with negative Jak 2 mutation and negative bone marrow biopsy.   He has been receiving phlebotomies on an as-needed basis to keep his hematocrit between <50%.  The patient is doing fine today without any concerning complaints.    The patient recently had a bone marrow biopsy and aspirate performed.  The patient was seen with Dr. Sherrod who reviewed the results.  This showed no evidence of bone marrow pathology.   Dr. Sherrod feels his polycythemia may be related to OSA.   The patient will continue his aspirin.   Repeat CBC today shows a hematocrit of 49.7.  Dr. Sherrod does not recommend a phlebotomy today.   Since this is likely secondary, Dr. Sherrod states that we do not need to see him on a regular basis since he does not have any hematological concerns.   He will continue to follow with Dr. Neysa for sleep apnea.   He is not able to donate blood at the [facility] but he cannot recall the reasoning. He will follow up.   The patient was advised to call immediately if he has any concerning symptoms in the interval. The patient voices understanding of current disease status and treatment options and is in agreement with the current care plan. All questions were answered. The patient knows to call the clinic with any problems, questions or concerns. We can certainly see the patient much sooner if necessary    No orders of the defined types were placed in this encounter.  Sarabelle Genson L  Victoria Euceda, PA-C 07/11/24  ADDENDUM: Hematology/Oncology Attending: I had a face-to-face encounter with the patient today.  I reviewed his records, lab, bone marrow biopsy results and recommended his care plan.  This is a very pleasant 63 years old white male with persistent polycythemia likely reactive polycythemia may be secondary to sleep apnea.  The patient had extensive work to rule out any underlying polycythemia vera and that included negative JAK2 mutation panel.  He also has a bone marrow biopsy and aspirate that showed the polycythemia is likely reactive in nature and there was no clear evidence for polycythemia vera characteristic in the bone marrow. I discussed the result with the patient and recommended for him to continue routine observation and monitoring by his primary care physician and pulmonologist. I will see him on as-needed basis but there is no underlying hematologic abnormality to explain his polycythemia. The patient is in agreement with the current plan. He was advised to call if he has any concerning symptoms. Disclaimer: This note was dictated with voice recognition software. Similar sounding words can inadvertently be transcribed and may be missed upon review. Sherrod MARLA Sherrod, MD

## 2024-07-11 ENCOUNTER — Inpatient Hospital Stay: Attending: Internal Medicine

## 2024-07-11 ENCOUNTER — Inpatient Hospital Stay: Admitting: Physician Assistant

## 2024-07-11 VITALS — BP 114/73 | HR 67 | Temp 98.0°F | Resp 16 | Ht 70.0 in | Wt 247.0 lb

## 2024-07-11 DIAGNOSIS — Z7982 Long term (current) use of aspirin: Secondary | ICD-10-CM | POA: Insufficient documentation

## 2024-07-11 DIAGNOSIS — D751 Secondary polycythemia: Secondary | ICD-10-CM | POA: Insufficient documentation

## 2024-07-11 DIAGNOSIS — G4733 Obstructive sleep apnea (adult) (pediatric): Secondary | ICD-10-CM | POA: Diagnosis not present

## 2024-07-11 DIAGNOSIS — D45 Polycythemia vera: Secondary | ICD-10-CM

## 2024-07-11 DIAGNOSIS — Z96653 Presence of artificial knee joint, bilateral: Secondary | ICD-10-CM | POA: Insufficient documentation

## 2024-07-11 DIAGNOSIS — I5022 Chronic systolic (congestive) heart failure: Secondary | ICD-10-CM | POA: Insufficient documentation

## 2024-07-11 DIAGNOSIS — I110 Hypertensive heart disease with heart failure: Secondary | ICD-10-CM | POA: Insufficient documentation

## 2024-07-11 DIAGNOSIS — Z8673 Personal history of transient ischemic attack (TIA), and cerebral infarction without residual deficits: Secondary | ICD-10-CM | POA: Insufficient documentation

## 2024-07-11 LAB — CMP (CANCER CENTER ONLY)
ALT: 21 U/L (ref 0–44)
AST: 22 U/L (ref 15–41)
Albumin: 4 g/dL (ref 3.5–5.0)
Alkaline Phosphatase: 58 U/L (ref 38–126)
Anion gap: 7 (ref 5–15)
BUN: 15 mg/dL (ref 8–23)
CO2: 21 mmol/L — ABNORMAL LOW (ref 22–32)
Calcium: 9.2 mg/dL (ref 8.9–10.3)
Chloride: 106 mmol/L (ref 98–111)
Creatinine: 1.27 mg/dL — ABNORMAL HIGH (ref 0.61–1.24)
GFR, Estimated: >60 mL/min (ref >60)
Glucose, Bld: 96 mg/dL (ref 70–99)
Potassium: 4.3 mmol/L (ref 3.5–5.1)
Sodium: 134 mmol/L — ABNORMAL LOW (ref 135–145)
Total Bilirubin: 1.1 mg/dL (ref 0.0–1.2)
Total Protein: 6.7 g/dL (ref 6.5–8.1)

## 2024-07-11 LAB — CBC WITH DIFFERENTIAL (CANCER CENTER ONLY)
Abs Immature Granulocytes: 0.03 K/uL (ref 0.00–0.07)
Basophils Absolute: 0 K/uL (ref 0.0–0.1)
Basophils Relative: 0 %
Eosinophils Absolute: 0.1 K/uL (ref 0.0–0.5)
Eosinophils Relative: 2 %
HCT: 49.7 % (ref 39.0–52.0)
Hemoglobin: 17.2 g/dL — ABNORMAL HIGH (ref 13.0–17.0)
Immature Granulocytes: 0 %
Lymphocytes Relative: 28 %
Lymphs Abs: 2.3 K/uL (ref 0.7–4.0)
MCH: 26.4 pg (ref 26.0–34.0)
MCHC: 34.6 g/dL (ref 30.0–36.0)
MCV: 76.2 fL — ABNORMAL LOW (ref 80.0–100.0)
Monocytes Absolute: 0.6 K/uL (ref 0.1–1.0)
Monocytes Relative: 7 %
Neutro Abs: 5 K/uL (ref 1.7–7.7)
Neutrophils Relative %: 63 %
Platelet Count: 159 K/uL (ref 150–400)
RBC: 6.52 MIL/uL — ABNORMAL HIGH (ref 4.22–5.81)
RDW: 17.8 % — ABNORMAL HIGH (ref 11.5–15.5)
WBC Count: 8.1 K/uL (ref 4.0–10.5)
nRBC: 0 % (ref 0.0–0.2)

## 2024-07-11 LAB — LACTATE DEHYDROGENASE: LDH: 108 U/L (ref 105–235)

## 2024-07-13 ENCOUNTER — Ambulatory Visit: Admitting: Internal Medicine

## 2024-07-13 ENCOUNTER — Other Ambulatory Visit: Payer: Self-pay

## 2024-07-13 ENCOUNTER — Encounter: Payer: Self-pay | Admitting: Internal Medicine

## 2024-07-13 ENCOUNTER — Other Ambulatory Visit (HOSPITAL_COMMUNITY): Payer: Self-pay

## 2024-07-13 VITALS — BP 123/76 | HR 70 | Temp 97.6°F | Ht 70.0 in | Wt 250.0 lb

## 2024-07-13 DIAGNOSIS — Z96659 Presence of unspecified artificial knee joint: Secondary | ICD-10-CM | POA: Diagnosis not present

## 2024-07-13 DIAGNOSIS — T8459XD Infection and inflammatory reaction due to other internal joint prosthesis, subsequent encounter: Secondary | ICD-10-CM

## 2024-07-13 DIAGNOSIS — Z96651 Presence of right artificial knee joint: Secondary | ICD-10-CM

## 2024-07-13 DIAGNOSIS — T8453XD Infection and inflammatory reaction due to internal right knee prosthesis, subsequent encounter: Secondary | ICD-10-CM

## 2024-07-13 MED ORDER — CEFADROXIL 500 MG PO CAPS
500.0000 mg | ORAL_CAPSULE | Freq: Two times a day (BID) | ORAL | 3 refills | Status: AC
  Filled 2024-07-13: qty 180, 90d supply, fill #0
  Filled 2024-07-14: qty 178, 89d supply, fill #0
  Filled 2024-07-14 (×2): qty 180, 90d supply, fill #0
  Filled 2024-08-04: qty 178, 89d supply, fill #1
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
## 2024-07-14 ENCOUNTER — Encounter (INDEPENDENT_AMBULATORY_CARE_PROVIDER_SITE_OTHER): Payer: Self-pay

## 2024-07-14 ENCOUNTER — Other Ambulatory Visit: Payer: Self-pay

## 2024-07-14 ENCOUNTER — Other Ambulatory Visit (HOSPITAL_COMMUNITY): Payer: Self-pay

## 2024-07-14 LAB — C-REACTIVE PROTEIN: CRP: <3 mg/L (ref <8.0)

## 2024-07-14 LAB — SEDIMENTATION RATE: Sed Rate: 2 mm/h (ref 0–20)

## 2024-07-14 NOTE — Progress Notes (Signed)
 Specialty Pharmacy Refill Coordination Note  MyChart Questionnaire Submission  William D Gootee Jr. is a 63 y.o. male contacted today regarding refills of specialty medication(s) Taltz .  Doses on hand: (Patient-Rptd) 0   Injection date: (Patient-Rptd) 07/21/24  Patient requested: (Patient-Rptd) Pickup at [facility] date: 07/20/24  Medication will be filled on 07/19/24.

## 2024-07-17 ENCOUNTER — Other Ambulatory Visit (HOSPITAL_COMMUNITY): Payer: Self-pay

## 2024-07-17 ENCOUNTER — Encounter: Payer: Self-pay | Admitting: Gastroenterology

## 2024-07-17 ENCOUNTER — Telehealth: Payer: Self-pay

## 2024-07-17 ENCOUNTER — Other Ambulatory Visit

## 2024-07-17 ENCOUNTER — Ambulatory Visit: Admitting: Gastroenterology

## 2024-07-17 VITALS — BP 118/68 | HR 62 | Ht 70.0 in | Wt 246.4 lb

## 2024-07-17 DIAGNOSIS — D45 Polycythemia vera: Secondary | ICD-10-CM

## 2024-07-17 DIAGNOSIS — K7469 Other cirrhosis of liver: Secondary | ICD-10-CM

## 2024-07-17 DIAGNOSIS — Z9889 Other specified postprocedural states: Secondary | ICD-10-CM

## 2024-07-17 DIAGNOSIS — Z860101 Personal history of adenomatous and serrated colon polyps: Secondary | ICD-10-CM

## 2024-07-17 DIAGNOSIS — I4821 Permanent atrial fibrillation: Secondary | ICD-10-CM

## 2024-07-17 DIAGNOSIS — Z7901 Long term (current) use of anticoagulants: Secondary | ICD-10-CM | POA: Diagnosis not present

## 2024-07-17 DIAGNOSIS — K8050 Calculus of bile duct without cholangitis or cholecystitis without obstruction: Secondary | ICD-10-CM

## 2024-07-17 LAB — COMPREHENSIVE METABOLIC PANEL WITH GFR
ALT: 24 U/L (ref 0–53)
AST: 21 U/L (ref 0–37)
Albumin: 4 g/dL (ref 3.5–5.2)
Alkaline Phosphatase: 54 U/L (ref 39–117)
BUN: 17 mg/dL (ref 6–23)
CO2: 22 meq/L (ref 19–32)
Calcium: 9.4 mg/dL (ref 8.4–10.5)
Chloride: 101 meq/L (ref 96–112)
Creatinine, Ser: 1.02 mg/dL (ref 0.40–1.50)
GFR: 77.39 mL/min (ref >60.00)
Glucose, Bld: 104 mg/dL — ABNORMAL HIGH (ref 70–99)
Potassium: 4.2 meq/L (ref 3.5–5.1)
Sodium: 133 meq/L — ABNORMAL LOW (ref 135–145)
Total Bilirubin: 1.2 mg/dL (ref 0.2–1.2)
Total Protein: 6.6 g/dL (ref 6.0–8.3)

## 2024-07-17 LAB — CBC WITH DIFFERENTIAL/PLATELET
Basophils Absolute: 0.1 K/uL (ref 0.0–0.1)
Basophils Relative: 0.8 % (ref 0.0–3.0)
Eosinophils Absolute: 0.1 K/uL (ref 0.0–0.7)
Eosinophils Relative: 1.1 % (ref 0.0–5.0)
HCT: 52.3 % — ABNORMAL HIGH (ref 39.0–52.0)
Hemoglobin: 17.2 g/dL — ABNORMAL HIGH (ref 13.0–17.0)
Lymphocytes Relative: 24.4 % (ref 12.0–46.0)
Lymphs Abs: 1.6 K/uL (ref 0.7–4.0)
MCHC: 32.9 g/dL (ref 30.0–36.0)
MCV: 79.2 fl (ref 78.0–100.0)
Monocytes Absolute: 0.5 K/uL (ref 0.1–1.0)
Monocytes Relative: 8.6 % (ref 3.0–12.0)
Neutro Abs: 4.1 K/uL (ref 1.4–7.7)
Neutrophils Relative %: 65.1 % (ref 43.0–77.0)
Platelets: 159 K/uL (ref 150.0–400.0)
RBC: 6.6 Mil/uL — ABNORMAL HIGH (ref 4.22–5.81)
RDW: 16.8 % — ABNORMAL HIGH (ref 11.5–15.5)
WBC: 6.4 K/uL (ref 4.0–10.5)

## 2024-07-17 LAB — PROTIME-INR
INR: 1.8 ratio — ABNORMAL HIGH (ref 0.8–1.0)
Prothrombin Time: 18.8 s — ABNORMAL HIGH (ref 9.6–13.1)

## 2024-07-17 MED ORDER — NA SULFATE-K SULFATE-MG SULF 17.5-3.13-1.6 GM/177ML PO SOLN
1.0000 | Freq: Once | ORAL | 0 refills | Status: AC
  Filled 2024-07-17: qty 354, 1d supply, fill #0

## 2024-07-17 NOTE — Progress Notes (Signed)
 [name] [id number] Jun 18, 1961   Chief Complaint: Discuss colonoscopy  Referring Provider: [name], DO Primary GI MD: Dr. Wilhelmenia  HPI: [name]. is a 63 y.o. male with past medical history of Afib (on Xarelto ),CHFpEF, diabetes, prior TIA, hypertension, polycythemia vera, arthritis (on chronic antibiotics for previous knee infection), diverticulosis, OSA, diverticulosis, colon polyps (TA's), status postcholecystectomy (history of choledocholithiasis and cystic duct stone-biliary stent retained),?CLD-Cirrhosis (imaging with possible PHG previously noted) who presents today to discuss colonoscopy.    Last seen in office 04/11/2024 for follow-up after ERCP.  Was doing fairly well at that time.  Noted to be overdue for colon cancer screening and colon polyp surveillance. Plan at that time was to schedule EGD with ERCP for esophageal variceal screening and biliary stent removal attempt.  Noted to be due for CT abdomen for HCC screening the following month.  If unsuccessful biliary stent extraction, academic center evaluation would need to be considered. Plan to schedule colonoscopy later in the year.  Had ERCP 06/29/2024 with stent removal.   Per message from Dr. Wilhelmenia 06/29/2024: Patient needs CBC, CMP, INR.  Needs follow-up office visit to discuss colon cancer screening.  Cirrhosis Evaluation: - Etiology: Likely fatty liver. Autoimmune markers unrevealing, negative hepatitis, normal ferritin, normal ceruloplasmin, normal alpha-1 antitrypsin. - HCC screening: Due - Variceal screening: UTD - Serologic evaluation: see above  MELD score <15 presuming normal INR.  Looks like he just had labs done today including CBC, CMP, PT/INR, AFP, will be sent to Dr. Wilhelmenia.   Discussed the use of AI scribe software for clinical note transcription with the patient, who gave verbal consent to proceed.  History of Present Illness [name] is a 63  year old male who presents for scheduling a colonoscopy.  Colorectal cancer screening and bowel habits - Last colonoscopy performed in 2019 with polyp detection; follow-up recommended in five years but postponed due to other medical issues - No current changes in bowel habits - Bowel movements occur regularly at approximately 3:30 AM - Stools are moderately loose but not diarrhea - No hematochezia or melena - Long-term use of Metamucil, taking two heaping tablespoons every morning  Chronic liver disease - Cirrhosis diagnosed during hospitalization in March - Workup negative for hepatitis and autoimmune etiologies - No significant history of alcohol  or drug use  Glycemic control and weight management - Currently taking Mounjaro  and Jardiance  - A1c improved - Significant weight loss following cholecystectomy with 12-day hospitalization for complications - Weight stable between 232 and 238 pounds with Mounjaro   Cardiac arrhythmia and anticoagulation - Permanent atrial fibrillation, asymptomatic - On Xarelto  for anticoagulation - No recent chest pain, shortness of breath, or lightheadedness - History of TIA 25-26 years ago, treated with PFO occluder  Sleep apnea - History of sleep apnea  Musculoskeletal infection and joint replacement - 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 - On permanent antibiotics for infection prophylaxis  Gastrointestinal symptoms - No current abdominal pain - Occasional chest discomfort relieved by belching, attributed to gas buildup - Avoids fried and rich foods to manage symptoms  Lifestyle and dietary modifications - Active as a product/process development scientist - No tobacco or recreational drug use - Alcohol  intake is infrequent - Dietary changes include reduced tea consumption and increased water  intake   Previous GI Procedures/Imaging   ERCP 06/29/2024 - One partially occluded stent from the biliary tree was seen in the  major papilla. The previously  embedded biliary stent was not visible ( still in the duct) .  - Prior biliary sphincterotomy appeared open.  - The fluoroscopic examination was suspicious for sludge. Presence of embedded biliary stent.  - Choledocholithiasis and biliary sludge was found. Complete removal was accomplished by sweeping.  - Finally, the previously embedded biliary stent was removed from the biliary tree ( see above) .  June 2025 ERCP - Prior biliary sphincterotomy appeared open.  - A previously placed stent had migrated into the biliary tree, unfortunately.  - The fluoroscopic examination was suspicious for sludge.  - Significant sludge and choledocholithiasis debris and small stone was extracted via balloon sweeping.  - A single mild biliary narrowing was found in the lower third of the main bile duct. The stricture was inflammatory in appearance.  - Sphincteroplasty performed.  - As noted above, unsuccessful removal with typical ERCP techniques.  - Decision to proceed with cholangioscopy and attempt at removal.  - As noted above, unsuccessful removal with cholangioscopy aided ERCP techniques.  - One plastic biliary stent was placed into the common bile duct (alongside the stent that is intraductal already).  March 2025 CT liver IMPRESSION: 1. Morphologic changes of cirrhosis without evidence of hepatic neoplasm. 2. Cholelithiasis and suspected choledocholithiasis with mild gallbladder wall thickening and surrounding inflammation. There is also wall enhancement of the cystic and common hepatic ducts which are mildly dilated. Findings are suspicious for cholecystitis and cholangitis. Follow-up imaging or ERCP recommended to exclude biliary malignancy. 3. 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 4. Aortic atherosclerosis.   March 2025 MRI liver IMPRESSION: 1. Impacted stone within the distal cystic duct has mass effect upon the  common bile duct. The stone measures 0.9 x 0.5 cm. Beyond this level the mid and distal common bile duct are nondilated without signs of distal choledocholithiasis. 2. Mild intrahepatic bile duct dilatation. Enhancement of the wall of the cystic duct and common bile duct noted. Findings are favored to reflect cholangitis. 3. Enhancing soft tissue within the gallbladder measures approximately 2.1 x 2.8 cm. This appears inseparable from the adjacent liver. Cannot exclude underlying gallbladder neoplasm. 4. Cirrhotic morphology of the liver with marked enlargement of the caudate lobe. No enhancing liver lesions identified. 5. Mild splenomegaly. 6. Lipoma within the right posterior paraspinal musculature measures 5.7 x 4.1 x 59.6 cm.   ERCP 11/25/2023 - Erythematous and petechial mucosa in the gastric fundus and gastric body. Gastritis vs portal gastropathy. No varices noted.  - Biopsies were taken with a cold forceps for histology in the gastric body.  - The upper third of the main bile duct was mildly dilated, with extrinsic compression from an impacted cystic duct stone causing an obstruction. There was associated cholangitis and purulent drainage from papilla.  - One plastic stent was placed into the bile duct. 10 fr 7 cm stent placed beyond area of compressed common bile duct.  Colonoscopy 12/23/2017 - Two 6 to 7 mm polyps in the sigmoid colon and in the cecum, removed with a cold snare. Resected and retrieved.  - Mild diverticulosis in the left colon. There was no evidence of diverticular bleeding.  - Small internal hemorrhoids. - Recall 5 years Path: Surgical [P], cecum and sigmoid, polyp (2) - HYPERPLASTIC POLYP (ONE FRAGMENT). - BENIGN COLONIC MUCOSA (TWO FRAGMENTS). - NO ADENOMATOUS CHANGE OR MALIGNANCY.  Past Medical History:  Diagnosis Date   Arthritis    Calculus of gallbladder with acute cholecystitis 11/23/2023   Cholangitis (HCC)  11/23/2023   Cholecystitis 11/29/2023    Chronic systolic heart failure (HCC) 09/27/2019   Diabetes mellitus without complication (HCC)    TYPE 2   Diverticulosis    Dizziness 11/11/2022   He was at his baseline state of health until this Sunday, when he experienced dizziness at church. He felt off with blurred vision, and went to wait in the car, but was well enough to drive home. On Monday, he had a similar episode, and had to leave work early, which is very atypical. His dizziness is associated with fatigue and generally not feeling well. It comes and goes, but he hasn't identi   Dyslipidemia    Elevated bilirubin 11/23/2023   GERD (gastroesophageal reflux disease)    Herpes labialis    Hypertension    Longstanding persistent atrial fibrillation (HCC)    Metabolic syndrome    Obesity    Proteinuria    Psoriasis    S/P left TKA 06/28/2018   S/P TKR (total knee replacement), right 05/31/2018   Seborrheic dermatitis    Sleep apnea    very compliant with CPAP, PT NEEDS TO BRING OWN MACHINE   TIA (transient ischemic attack) 2009    Past Surgical History:  Procedure Laterality Date   BILIARY STENT PLACEMENT  11/25/2023   Procedure: INSERTION, STENT, BILE DUCT;  Surgeon: Avram Lupita BRAVO, MD;  Location: [facility] ENDOSCOPY;  Service: Gastroenterology;;   CARDIOVERSION N/A 12/02/2016   Procedure: CARDIOVERSION;  Surgeon: Vinie JAYSON Maxcy, MD;  Location: [facility] ENDOSCOPY;  Service: Cardiovascular;  Laterality: N/A;   CHOLECYSTECTOMY N/A 11/29/2023   Procedure: LAPAROSCOPIC CHOLECYSTECTOMY WITH INTRAOPERATIVE CHOLANGIOGRAM;  Surgeon: Dasie Leonor CROME, MD;  Location: [facility] OR;  Service: General;  Laterality: N/A;   COLONOSCOPY     ERCP N/A 11/25/2023   Procedure: ERCP, WITH INTERVENTION IF INDICATED;  Surgeon: Avram Lupita BRAVO, MD;  Location: [facility] ENDOSCOPY;  Service: Gastroenterology;  Laterality: N/A;  With stent placement   ERCP N/A 01/31/2024   Procedure: ERCP, WITH INTERVENTION IF INDICATED;  Surgeon: Wilhelmenia Aloha Raddle., MD;  Location: [facility]  ENDOSCOPY;  Service: Gastroenterology;  Laterality: N/A;   ERCP N/A 06/29/2024   Procedure: ERCP, WITH INTERVENTION IF INDICATED;  Surgeon: Wilhelmenia Aloha Raddle., MD;  Location: [facility] ENDOSCOPY;  Service: Gastroenterology;  Laterality: N/A;   ESOPHAGOGASTRODUODENOSCOPY N/A 11/25/2023   Procedure: EGD (ESOPHAGOGASTRODUODENOSCOPY);  Surgeon: Avram Lupita BRAVO, MD;  Location: [facility] ENDOSCOPY;  Service: Gastroenterology;  Laterality: N/A;   EXCISIONAL TOTAL KNEE ARTHROPLASTY WITH ANTIBIOTIC SPACERS Right 03/28/2019   Procedure: EXCISIONAL TOTAL KNEE ARTHROPLASTY WITH ANTIBIOTIC SPACERS;  Surgeon: Ernie Cough, MD;  Location: [facility] ORS;  Service: Orthopedics;  Laterality: Right;  90 mins   I & D KNEE WITH POLY EXCHANGE Right 09/10/2018   Procedure: Right Knee Arthroplasty IRRIGATION AND DEBRIDEMENT KNEE WITH POLY EXCHANGE;  Surgeon: Ernie Cough, MD;  Location: [facility] ORS;  Service: Orthopedics;  Laterality: Right;   I & D KNEE WITH POLY EXCHANGE Right 06/25/2020   Procedure: Open excisional and non excisional debridement right knee, possible aspiration versus open arthrotomy poly exchange;  Surgeon: Ernie Cough, MD;  Location: [facility] ORS;  Service: Orthopedics;  Laterality: Right;   IRRIGATION AND DEBRIDEMENT KNEE Right 07/09/2020   Procedure: IRRIGATION AND DEBRIDEMENT KNEE;  Surgeon: Ernie Cough, MD;  Location: [facility] ORS;  Service: Orthopedics;  Laterality: Right;   JOINT REPLACEMENT  11/03/10   LT HIP   PFO occluder cardiac valve  2006   Dr. Ladona, (hole in heart)   REIMPLANTATION OF TOTAL KNEE  Right 06/15/2019   Procedure: Resection of antibiotic spacer and  irrigation and debridment and placement of new antibiotic spacer and components;  Surgeon: Ernie Cough, MD;  Location: [facility] ORS;  Service: Orthopedics;  Laterality: Right;  90 mins   REIMPLANTATION OF TOTAL KNEE Right 08/22/2019   Procedure: REIMPLANTATION OF TOTAL KNEE;  Surgeon: Ernie Cough, MD;  Location: [facility] ORS;  Service: Orthopedics;  Laterality: Right;   120 mins   SKIN BIOPSY Left 12/22/2019   epidermal inclusion cyst   SPYGLASS CHOLANGIOSCOPY N/A 01/31/2024   Procedure: CHOLANGIOSCOPY, USING SPYGLASS;  Surgeon: Wilhelmenia Aloha Raddle., MD;  Location: [facility] ENDOSCOPY;  Service: Gastroenterology;  Laterality: N/A;   TONSILLECTOMY AND ADENOIDECTOMY     TOTAL HIP ARTHROPLASTY Left    TOTAL KNEE ARTHROPLASTY Right 05/31/2018   Procedure: RIGHT TOTAL KNEE ARTHROPLASTY;  Surgeon: Ernie Cough, MD;  Location: [facility] ORS;  Service: Orthopedics;  Laterality: Right;  70 mins   TOTAL KNEE ARTHROPLASTY Left 06/28/2018   Procedure: LEFT TOTAL KNEE ARTHROPLASTY;  Surgeon: Ernie Cough, MD;  Location: [facility] ORS;  Service: Orthopedics;  Laterality: Left;  70 mins   WISDOM TOOTH EXTRACTION      Current Outpatient Medications  Medication Sig Dispense Refill   acetaminophen  (TYLENOL ) 500 MG tablet Take 500 mg by mouth daily as needed for mild pain (pain score 1-3) or moderate pain (pain score 4-6).     Ascorbic Acid (VITAMIN C) 100 MG tablet Take 100 mg by mouth daily.     b complex vitamins capsule Take 1 capsule by mouth daily.     carvedilol  (COREG ) 12.5 MG tablet Take 1 tablet (12.5 mg total) by mouth 2 (two) times daily. 180 tablet 3   cefadroxil  (DURICEF) 500 MG capsule Take 1 capsule (500 mg total) by mouth 2 (two) times daily. 180 capsule 3   empagliflozin  (JARDIANCE ) 10 MG TABS tablet Take 1 tablet (10 mg total) by mouth daily. 90 tablet 1   Multiple Vitamin (MULTIVITAMIN WITH MINERALS) TABS tablet Take 1 tablet by mouth daily.     mupirocin  ointment (BACTROBAN ) 2 % Apply to affected area twice a day. 22 g 0   Na Sulfate-K Sulfate-Mg Sulfate concentrate (SUPREP) 17.5-3.13-1.6 GM/177ML SOLN Take 1 kit (354 mLs total) by mouth once for 1 dose. 354 mL 0   Oxymetazoline  HCl (NASAL SPRAY) 0.05 % SOLN Place 1 spray into the nose at bedtime as needed (congestion).     pantoprazole  (PROTONIX ) 40 MG tablet Take 1 tablet (40 mg total) by mouth daily. 180 tablet 0    rivaroxaban  (XARELTO ) 20 MG TABS tablet Take 1 tablet (20 mg total) by mouth daily with supper. 90 tablet 1   rosuvastatin  (CRESTOR ) 10 MG tablet Take 1 tablet (10 mg total) by mouth daily. 90 tablet 1   sacubitril -valsartan  (ENTRESTO ) 24-26 MG Take 1 tablet by mouth 2 (two) times daily. 180 tablet 1   TALTZ  80 MG/ML pen Inject 1 ml (80mg ) under the skin every 4 weeks. 1 mL 11   tirzepatide  (MOUNJARO ) 15 MG/0.5ML Pen Inject 15 mg into the skin once a week. 6 mL 3   Vitamin D , Cholecalciferol , 1000 units CAPS Take 1,000 Units by mouth daily.      No current facility-administered medications for this visit.    Allergies as of 07/17/2024 - Review Complete 07/17/2024  Allergen Reaction Noted   Morphine  Other (See Comments) 12/06/2023    Family History  Problem Relation Age of Onset   Uterine cancer Mother 20  Cervical cath   Crohn's disease Mother    Esophageal cancer Father    Cancer Other    Obesity Other    Colon cancer Neg Hx    Inflammatory bowel disease Neg Hx    Liver disease Neg Hx    Pancreatic cancer Neg Hx    Rectal cancer Neg Hx    Stomach cancer Neg Hx     Social History   Tobacco Use   Smoking status: Never   Smokeless tobacco: Never  Vaping Use   Vaping status: Never Used  Substance Use Topics   Alcohol  use: Yes    Comment: rarely   Drug use: No     Review of Systems:    Constitutional: No unintentional weight loss, fever, chills Cardiovascular: No chest pain Respiratory: No SOB Gastrointestinal: See HPI and otherwise negative   Physical Exam:  Vital signs: BP 118/68   Pulse 62   Ht 5' 10 (1.778 m)   Wt 246 lb 6 oz (111.8 kg)   BMI 35.35 kg/m   Constitutional: Pleasant, obese male in NAD, alert and cooperative Head:  Normocephalic and atraumatic.  Eyes: No scleral icterus.  Respiratory: Respirations even and unlabored. Lungs clear to auscultation bilaterally.  No wheezes, crackles, or rhonchi.  Cardiovascular:  Regular rate, irregularly  irregular rhythm. No murmurs. No peripheral edema. Gastrointestinal:  Soft, protuberant/obese abdomen, nontender. No rebound or guarding. Normal bowel sounds. No appreciable masses or hepatomegaly. Rectal:  Not performed.  Neurologic:  Alert and oriented x4;  grossly normal neurologically.  Skin:   Dry and intact without significant lesions or rashes. Psychiatric: Oriented to person, place and time. Demonstrates good judgement and reason without abnormal affect or behaviors.  RELEVANT LABS AND IMAGING: CBC    Component Value Date/Time   WBC 6.4 07/17/2024 0926   RBC 6.60 (H) 07/17/2024 0926   HGB 17.2 (H) 07/17/2024 0926   HGB 17.2 (H) 07/11/2024 1459   HGB 17.5 04/28/2023 1114   HGB 16.9 06/03/2016 1321   HCT 52.3 (H) 07/17/2024 0926   HCT 52.9 (H) 04/28/2023 1114   HCT 46.3 06/03/2016 1321   PLT 159.0 07/17/2024 0926   PLT 159 07/11/2024 1459   PLT 190 04/28/2023 1114   MCV 79.2 07/17/2024 0926   MCV 80 04/28/2023 1114   MCV 81.8 06/03/2016 1321   MCH 26.4 07/11/2024 1459   MCHC 32.9 07/17/2024 0926   RDW 16.8 (H) 07/17/2024 0926   RDW 18.8 (H) 04/28/2023 1114   RDW 14.6 06/03/2016 1321   LYMPHSABS 1.6 07/17/2024 0926   LYMPHSABS 1.6 11/25/2020 1004   LYMPHSABS 2.9 06/03/2016 1321   MONOABS 0.5 07/17/2024 0926   MONOABS 0.7 06/03/2016 1321   EOSABS 0.1 07/17/2024 0926   EOSABS 0.1 11/25/2020 1004   BASOSABS 0.1 07/17/2024 0926   BASOSABS 0.0 11/25/2020 1004   BASOSABS 0.0 06/03/2016 1321    CMP     Component Value Date/Time   NA 133 (L) 07/17/2024 0926   NA 133 (L) 01/10/2024 1053   NA 138 05/22/2015 1303   K 4.2 07/17/2024 0926   K 4.1 05/22/2015 1303   CL 101 07/17/2024 0926   CO2 22 07/17/2024 0926   CO2 23 05/22/2015 1303   GLUCOSE 104 (H) 07/17/2024 0926   GLUCOSE 162 (H) 05/22/2015 1303   BUN 17 07/17/2024 0926   BUN 11 01/10/2024 1053   BUN 17.4 05/22/2015 1303   CREATININE 1.02 07/17/2024 0926   CREATININE 1.27 (H) 07/11/2024 1459   CREATININE  1.07 03/13/2024 1028   CREATININE 1.1 05/22/2015 1303   CALCIUM  9.4 07/17/2024 0926   CALCIUM  9.4 05/22/2015 1303   PROT 6.6 07/17/2024 0926   PROT 6.9 05/27/2020 0916   PROT 6.9 05/22/2015 1303   ALBUMIN  4.0 07/17/2024 0926   ALBUMIN  4.6 05/27/2020 0916   ALBUMIN  3.9 05/22/2015 1303   AST 21 07/17/2024 0926   AST 22 07/11/2024 1459   AST 24 05/22/2015 1303   ALT 24 07/17/2024 0926   ALT 21 07/11/2024 1459   ALT 30 05/22/2015 1303   ALKPHOS 54 07/17/2024 0926   ALKPHOS 73 05/22/2015 1303   BILITOT 1.2 07/17/2024 0926   BILITOT 1.1 07/11/2024 1459   BILITOT 0.77 05/22/2015 1303   GFRNONAA >60 07/11/2024 1459   GFRNONAA 66 11/24/2016 0946   GFRAA 74 05/27/2020 0916   GFRAA 76 11/24/2016 0946   Echocardiogram 07/09/2022 1. Left ventricular ejection fraction, by estimation, is 55 to 60% . The left ventricle has normal function. The left ventricle has no regional wall motion abnormalities. There is moderate left ventricular hypertrophy. Left ventricular diastolic parameters are indeterminate.  2. Right ventricular systolic function is normal. The right ventricular size is mildly enlarged.  3. Left atrial size was mildly dilated.  4. PFO closure.  5. Right atrial size was mildly dilated.  6. The mitral valve is normal in structure. No evidence of mitral valve regurgitation. No evidence of mitral stenosis.  7. The aortic valve is tricuspid. There is mild calcification of the aortic valve. Aortic valve regurgitation is not visualized. No aortic stenosis is present.  8. Aortic dilatation noted. There is mild dilatation of the ascending aorta, measuring 41 mm.  9. The inferior vena cava is normal in size with greater than 50% respiratory variability, suggesting right atrial pressure of 3 mmHg.  Assessment/Plan:   Assessment & Plan History of colon polyps Due for surveillance colonoscopy. Previous polyps noted, follow-up delayed. No current bowel issues.  - Scheduled colonoscopy. I  thoroughly discussed the procedure with the patient to include nature of the procedure, alternatives, benefits, and risks (including but not limited to bleeding, infection, perforation, anesthesia/cardiac/pulmonary complications). Patient verbalized understanding and gave verbal consent to proceed with procedure.  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  MELD score less than 15 presuming normal INR.  Had repeat labs drawn today. Due for HCC screening.  - Ordered CT liver protocol  - Labs drawn today: CBC, CMP, PT/INR, AFP (ordered by Dr. Wilhelmenia) - Follow-up 6 months  Permanent atrial fibrillation Managed with Xarelto . No recent symptoms. Active lifestyle.  - Will request Xarelto  hold for procedure  Type 2 diabetes mellitus Well-controlled with Mounjaro  and Jardiance . A1c 4.8  - Will need to hold Mounjaro  prior to procedure    Camie Furbish, PA-C  Gastroenterology 07/17/2024, 5:24 PM  Patient Care Team: [name], DO as PCP - General (Internal Medicine) [name], Aleene PARAS, MD (Inactive) as PCP - Cardiology (Cardiology)

## 2024-07-17 NOTE — Progress Notes (Signed)
Hep B injection given

## 2024-07-17 NOTE — Patient Instructions (Signed)
 You have been scheduled for a colonoscopy. Please follow written instructions given to you at your visit today.   If you use inhalers (even only as needed), please bring them with you on the day of your procedure.  DO NOT TAKE 7 DAYS PRIOR TO TEST- Trulicity  (dulaglutide ) Ozempic , Wegovy  (semaglutide ) Mounjaro , Zepbound  (tirzepatide ) Bydureon Bcise (exanatide extended release)  DO NOT TAKE 1 DAY PRIOR TO YOUR TEST Rybelsus  (semaglutide ) Adlyxin (lixisenatide) Victoza (liraglutide) Byetta (exanatide) ___________________________________________________________________________  You have been scheduled for a CT scan of the liver abdomen at [facility], 1st floor Radiology. You are scheduled on 07/24/24 at 3 pm. You should arrive 15 minutes prior to your appointment time for registration.   Please follow the written instructions below on the day of your exam:   1) Do not eat anything after 11 am (4 hours prior to your test)    You may take any medications as prescribed with a small amount of water , if necessary. If you take any of the following medications: METFORMIN , GLUCOPHAGE , GLUCOVANCE, AVANDAMET, RIOMET , FORTAMET , ACTOPLUS MET, JANUMET, GLUMETZA  or METAGLIP, you MAY be asked to HOLD this medication 48 hours AFTER the exam.   The purpose of you drinking the oral contrast is to aid in the visualization of your intestinal tract. The contrast solution may cause some diarrhea. Depending on your individual set of symptoms, you may also receive an intravenous injection of x-ray contrast/dye. Plan on being at [facility] for 45 minutes or longer, depending on the type of exam you are having performed.   If you have any questions regarding your exam or if you need to reschedule, you may call [facility] at [phone] between the hours of 8:00 am and 5:00 pm, Monday-Friday.

## 2024-07-17 NOTE — Telephone Encounter (Signed)
 [facility] Pre-operative Risk Assessment     Request for surgical clearance:     Endoscopy Procedure  What type of surgery is being performed?     colon  When is this surgery scheduled?     09/08/24  What type of clearance is required ?   Pharmacy  Are there any medications that need to be held prior to surgery and how long? Xarelto  2 days  Practice name and name of physician performing surgery?      [facility]  What is your office phone and fax number?      Phone- [phone]  Fax- [phone]  Anesthesia type (None, local, MAC, general) ?       MAC   Please route your response to Corean [place], [facility]

## 2024-07-19 ENCOUNTER — Ambulatory Visit: Payer: Self-pay | Admitting: Gastroenterology

## 2024-07-19 ENCOUNTER — Other Ambulatory Visit: Payer: Self-pay

## 2024-07-19 LAB — AFP TUMOR MARKER: AFP-Tumor Marker: 3.9 ng/mL (ref <6.1)

## 2024-07-19 NOTE — Progress Notes (Signed)
 Attending Physician's Attestation   I have reviewed the chart.   I agree with the Advanced Practitioner's note, impression, and recommendations with any updates as below.    Corliss Parish, MD [facility] Advanced Endoscopy Office # [phone]

## 2024-07-20 ENCOUNTER — Ambulatory Visit: Payer: Self-pay | Admitting: Gastroenterology

## 2024-07-21 ENCOUNTER — Encounter: Payer: Self-pay | Admitting: Internal Medicine

## 2024-07-24 ENCOUNTER — Other Ambulatory Visit (HOSPITAL_COMMUNITY): Payer: Self-pay

## 2024-07-24 ENCOUNTER — Ambulatory Visit (HOSPITAL_COMMUNITY)
Admission: RE | Admit: 2024-07-24 | Discharge: 2024-07-24 | Disposition: A | Discharge status: Home / Self Care | Source: Ambulatory Visit | Attending: Gastroenterology | Admitting: Gastroenterology

## 2024-07-24 DIAGNOSIS — K7460 Unspecified cirrhosis of liver: Secondary | ICD-10-CM | POA: Diagnosis not present

## 2024-07-24 DIAGNOSIS — D1771 Benign lipomatous neoplasm of kidney: Secondary | ICD-10-CM | POA: Diagnosis not present

## 2024-07-24 DIAGNOSIS — K7469 Other cirrhosis of liver: Secondary | ICD-10-CM | POA: Insufficient documentation

## 2024-07-24 DIAGNOSIS — R161 Splenomegaly, not elsewhere classified: Secondary | ICD-10-CM | POA: Diagnosis not present

## 2024-07-24 MED ORDER — IOHEXOL 300 MG/ML  SOLN
100.0000 mL | Freq: Once | INTRAMUSCULAR | Status: AC | PRN
  Administered 2024-07-24: 100 mL via INTRAVENOUS

## 2024-07-30 ENCOUNTER — Other Ambulatory Visit: Payer: Self-pay

## 2024-07-30 NOTE — Progress Notes (Unsigned)
 Cardiology Office Note:  .   Date:  08/02/2024  ID:  [name]., DOB 1961-08-13, MRN [id number] PCP: [name], DO  [facility] Providers Cardiologist:  Aleene Passe, MD (Inactive)   History of Present Illness: .    Chief Complaint  Patient presents with   Follow-up    [name]. is a 63 y.o. male with history of persistent Afib, CHF, DM, cirrhosis, TIA who presents for follow-up.    History of Present Illness   [name] is a 63 year old male with permanent atrial fibrillation and systolic heart failure who presents for follow-up. He is accompanied by his wife.   He has a history of permanent atrial fibrillation, which is asymptomatic. His smartwatch alerts him when he is in AFib. He is currently on carvedilol  and Xarelto  for management. No chest pain or trouble breathing.  No history of myocardial infarction.  He has type 2 diabetes, which is well-controlled with a recent HbA1c of 5.6%.  He has nonalcoholic cirrhosis and fatty liver disease. A recent CT scan suggested a possible stone in the bile duct. He previously had a gallstone causing an infection, which was masked by antibiotics for his knee. He underwent cholecystectomy in March and had stents placed and later removed from the bile duct.  He has obstructive sleep apnea.  He has undergone multiple knee replacements, with the right knee replaced six times and the left knee once. He is on permanent antibiotics due to these surgeries.  He reports being active, eating well, and rarely consuming alcohol .          Problem List Permanent Afib PFO s/p closure  TIA OSA Chronic systolic HF -EF 35-40% 05/2018 -EF 55-60% 07/2022 -negative MPI 07/2022 6. HLD -T chol 99, HDL 37, LDL 37, TG 146 7. DM -A1c 5.2 8.  Cirrhosis  -NAFLD 9. 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 Pertinent positives noted in the HPI.     Studies Reviewed:  SABRA   EKG Interpretation Date/Time:  Wednesday August 02 2024 08:55:08 EST Ventricular Rate:  65 PR Interval:    QRS Duration:  112 QT Interval:  388 QTC Calculation: 403 R Axis:   -53  Text Interpretation: Atrial fibrillation Left axis deviation Low voltage QRS Inferior infarct , age undetermined Cannot rule out Anteroseptal infarct (cited on or before 20-Jun-2008) Confirmed by Barbaraann Kotyk [id number]) on 08/02/2024 8:57:12 AM   NM Stress 07/2022 normal MPI Physical Exam:   VS:  BP (!) 110/56   Pulse 65   Ht 5' 10 (1.778 m)   Wt 249 lb (112.9 kg)   SpO2 97%   BMI 35.73 kg/m    Wt Readings from Last 3 Encounters:  08/02/24 249 lb (112.9 kg)  07/17/24 246 lb 6 oz (111.8 kg)  07/13/24 250 lb (113.4 kg)    GEN: Well nourished, well developed in no acute distress NECK: No JVD; No carotid bruits CARDIAC: irregular rhythm, no murmurs, rubs, gallops RESPIRATORY:  Clear to auscultation without rales, wheezing or rhonchi  ABDOMEN: Soft, non-tender, non-distended EXTREMITIES:  No edema; No deformity  ASSESSMENT AND PLAN: .   Assessment and Plan    Permanent atrial fibrillation Well-controlled with carvedilol . EKG shows good control. Xarelto  used for anticoagulation. - Continue carvedilol  12.5 mg BID. - Refilled Xarelto  prescription for one year.  Heart failure with recovered ejection fraction Heart failure with previously reduced ejection  fraction, now recovered to normal. Current regimen effective. - Continue carvedilol  12.5 mg BID. - Continue Entresto  24/26 mg BID.  Mixed hyperlipidemia with coronary artery calcification Mixed hyperlipidemia with coronary artery calcification. LDL well-controlled at 37 mg/dL. - Continue Crestor  10 mg daily.              Follow-up: Return in about 1 year (around 08/02/2025).  Signed, Darryle DASEN. Barbaraann, MD, [facility]  [facility]  [address] [place], [place] [address] [phone]  10:19 AM

## 2024-07-30 NOTE — Telephone Encounter (Signed)
 Patient with diagnosis of atrial fibrillation on Xarelto  for anticoagulation.    What type of surgery is being performed?     colon  When is this surgery scheduled?     09/08/24    CHA2DS2-VASc Score = 3   This indicates a 3.2% annual risk of stroke. The patient's score is based upon: CHF History: 1 HTN History: 1 Diabetes History: 1 Stroke History: 0 Vascular Disease History: 0 Age Score: 0 Gender Score: 0   Chart indicates history of TIA in 2012  CrCl 114 Platelet count 159  Patient has not had an Afib/aflutter ablation in the last 3 months, DCCV within the last 4 weeks or a watchman implanted in the last 45 days   Per office protocol, patient can hold Xarelto  for 2 days prior to procedure.   Patient will not need bridging with Lovenox  (enoxaparin ) around procedure.  **This guidance is not considered finalized until pre-operative APP has relayed final recommendations.**

## 2024-07-31 ENCOUNTER — Ambulatory Visit: Payer: Self-pay | Admitting: Gastroenterology

## 2024-07-31 NOTE — Telephone Encounter (Signed)
   Patient Name: William D Harbor Jr.  DOB: 29-Aug-1961 MRN: [id number]  Primary Cardiologist: Aleene Passe, MD (Inactive)  Clinical pharmacists have reviewed the patient's past medical history, labs, and current medications as part of preoperative protocol coverage. The following recommendations have been made:  Patient with diagnosis of atrial fibrillation on Xarelto  for anticoagulation.     What type of surgery is being performed?     colon  When is this surgery scheduled?     09/08/24      CHA2DS2-VASc Score = 3   This indicates a 3.2% annual risk of stroke. The patient's score is based upon: CHF History: 1 HTN History: 1 Diabetes History: 1 Stroke History: 0 Vascular Disease History: 0 Age Score: 0 Gender Score: 0   Chart indicates history of TIA in 2012   CrCl 114 Platelet count 159   Patient has not had an Afib/aflutter ablation in the last 3 months, DCCV within the last 4 weeks or a watchman implanted in the last 45 days    Per office protocol, patient can hold Xarelto  for 2 days prior to procedure.   Patient will not need bridging with Lovenox  (enoxaparin ) around procedure.   I will route this recommendation to the requesting party via Epic fax function and remove from pre-op pool.  Please call with questions.  Lamarr Satterfield, NP 07/31/2024, 7:35 AM

## 2024-08-01 ENCOUNTER — Other Ambulatory Visit: Payer: Self-pay

## 2024-08-01 DIAGNOSIS — K8050 Calculus of bile duct without cholangitis or cholecystitis without obstruction: Secondary | ICD-10-CM

## 2024-08-01 DIAGNOSIS — K831 Obstruction of bile duct: Secondary | ICD-10-CM

## 2024-08-01 NOTE — Telephone Encounter (Signed)
 Pt informed to hold Xarelto  and states understanding. He mentioned he will be est care with a new cardiologist tomorrow, 08/02/24 with Dr Barbaraann, and he will discuss it with them at his appointment as well.

## 2024-08-02 ENCOUNTER — Encounter: Payer: Self-pay | Admitting: Cardiovascular Disease

## 2024-08-02 ENCOUNTER — Ambulatory Visit: Attending: Cardiovascular Disease | Admitting: Cardiovascular Disease

## 2024-08-02 ENCOUNTER — Other Ambulatory Visit (HOSPITAL_COMMUNITY): Payer: Self-pay

## 2024-08-02 VITALS — BP 110/56 | HR 65 | Ht 70.0 in | Wt 249.0 lb

## 2024-08-02 DIAGNOSIS — I5032 Chronic diastolic (congestive) heart failure: Secondary | ICD-10-CM

## 2024-08-02 DIAGNOSIS — I10 Essential (primary) hypertension: Secondary | ICD-10-CM | POA: Diagnosis not present

## 2024-08-02 DIAGNOSIS — E782 Mixed hyperlipidemia: Secondary | ICD-10-CM

## 2024-08-02 DIAGNOSIS — I4821 Permanent atrial fibrillation: Secondary | ICD-10-CM

## 2024-08-02 MED ORDER — RIVAROXABAN 20 MG PO TABS
20.0000 mg | ORAL_TABLET | Freq: Every day | ORAL | 3 refills | Status: AC
  Filled 2024-08-02: qty 90, 90d supply, fill #0

## 2024-08-02 NOTE — Patient Instructions (Addendum)
 Medication Instructions:  NO CHANGES  Lab Work: NONE TO BE DONE TODAY.  Testing/Procedures: NONE  Follow-Up: At [facility], you and your health needs are our priority.  As part of our continuing mission to provide you with exceptional heart care, our providers are all part of one team.  This team includes your primary Cardiologist (physician) and Advanced Practice Providers or APPs (Physician Assistants and Nurse Practitioners) who all work together to provide you with the care you need, when you need it.  Your next appointment:   1 YEAR  Provider:   Dr. Barbaraann, MD

## 2024-08-04 ENCOUNTER — Other Ambulatory Visit: Payer: Self-pay

## 2024-08-04 ENCOUNTER — Other Ambulatory Visit (HOSPITAL_COMMUNITY): Payer: Self-pay

## 2024-08-08 ENCOUNTER — Other Ambulatory Visit: Payer: Self-pay

## 2024-08-08 DIAGNOSIS — H524 Presbyopia: Secondary | ICD-10-CM | POA: Diagnosis not present

## 2024-08-08 LAB — OPHTHALMOLOGY REPORT-SCANNED

## 2024-08-09 ENCOUNTER — Ambulatory Visit (HOSPITAL_COMMUNITY): Admission: RE | Admit: 2024-08-09 | Discharge: 2024-08-09 | Attending: Gastroenterology | Admitting: Gastroenterology

## 2024-08-09 ENCOUNTER — Encounter (HOSPITAL_COMMUNITY): Payer: Self-pay

## 2024-08-09 ENCOUNTER — Other Ambulatory Visit: Payer: Self-pay | Admitting: Gastroenterology

## 2024-08-09 DIAGNOSIS — K831 Obstruction of bile duct: Secondary | ICD-10-CM | POA: Insufficient documentation

## 2024-08-09 DIAGNOSIS — K8050 Calculus of bile duct without cholangitis or cholecystitis without obstruction: Secondary | ICD-10-CM | POA: Diagnosis present

## 2024-08-09 DIAGNOSIS — K7689 Other specified diseases of liver: Secondary | ICD-10-CM | POA: Diagnosis not present

## 2024-08-09 DIAGNOSIS — Z9049 Acquired absence of other specified parts of digestive tract: Secondary | ICD-10-CM | POA: Diagnosis not present

## 2024-08-09 DIAGNOSIS — K7460 Unspecified cirrhosis of liver: Secondary | ICD-10-CM | POA: Diagnosis not present

## 2024-08-09 MED ORDER — GADOBUTROL 1 MMOL/ML IV SOLN
10.0000 mL | Freq: Once | INTRAVENOUS | Status: AC | PRN
  Administered 2024-08-09: 10 mL via INTRAVENOUS

## 2024-08-09 MED ORDER — GADOBUTROL 1 MMOL/ML IV SOLN: 10.0000 mL | Freq: Once | INTRAVENOUS | Status: DC | PRN

## 2024-08-10 ENCOUNTER — Ambulatory Visit: Payer: Self-pay | Admitting: Gastroenterology

## 2024-08-11 ENCOUNTER — Other Ambulatory Visit: Payer: Self-pay

## 2024-08-11 ENCOUNTER — Other Ambulatory Visit (HOSPITAL_COMMUNITY): Payer: Self-pay

## 2024-08-11 NOTE — Progress Notes (Signed)
 Specialty Pharmacy Refill Coordination Note  MyChart Questionnaire Submission  William D Eichel Jr. is a 63 y.o. male contacted today regarding refills of specialty medication(s) Taltz .  Doses on hand: (Patient-Rptd) 0   Next inj: (Patient-Rptd) 08/18/24  Patient requested: (Patient-Rptd) Pickup at [facility] date: 08/17/24  Medication will be filled on 08/16/24

## 2024-08-16 ENCOUNTER — Other Ambulatory Visit: Payer: Self-pay

## 2024-08-17 ENCOUNTER — Other Ambulatory Visit: Payer: Self-pay

## 2024-08-28 DIAGNOSIS — G4733 Obstructive sleep apnea (adult) (pediatric): Secondary | ICD-10-CM | POA: Diagnosis not present

## 2024-09-08 ENCOUNTER — Encounter: Payer: Self-pay | Admitting: Gastroenterology

## 2024-09-08 ENCOUNTER — Ambulatory Visit: Admitting: Gastroenterology

## 2024-09-08 VITALS — BP 113/83 | HR 74 | Temp 97.2°F | Resp 14 | Ht 70.0 in | Wt 246.6 lb

## 2024-09-08 DIAGNOSIS — K5730 Diverticulosis of large intestine without perforation or abscess without bleeding: Secondary | ICD-10-CM | POA: Diagnosis not present

## 2024-09-08 DIAGNOSIS — K644 Residual hemorrhoidal skin tags: Secondary | ICD-10-CM | POA: Diagnosis not present

## 2024-09-08 DIAGNOSIS — Z860101 Personal history of adenomatous and serrated colon polyps: Secondary | ICD-10-CM | POA: Diagnosis not present

## 2024-09-08 DIAGNOSIS — Z1211 Encounter for screening for malignant neoplasm of colon: Secondary | ICD-10-CM

## 2024-09-08 DIAGNOSIS — K641 Second degree hemorrhoids: Secondary | ICD-10-CM | POA: Diagnosis not present

## 2024-09-08 MED ORDER — SODIUM CHLORIDE 0.9 % IV SOLN: 500.0000 mL | INTRAVENOUS | Status: AC

## 2024-09-08 NOTE — Progress Notes (Signed)
 Report to PACU, RN, vss, BBS= Clear.

## 2024-09-08 NOTE — Op Note (Signed)
 [facility] Patient Name: [name] Procedure Date: 09/08/2024 7:50 AM MRN: [id number] Endoscopist: Aloha Finner , MD, [id number] Age: 64 Referring MD:  Date of Birth: [birth year] Gender: Male Account #: [id number] Procedure:                Colonoscopy Indications:              High risk colon cancer surveillance: Personal                            history of adenoma less than 10 mm in size Medicines:                Monitored Anesthesia Care Procedure:                Pre-Anesthesia Assessment:                           - Prior to the procedure, a History and Physical                            was performed, and patient medications and                            allergies were reviewed. The patient's tolerance of                            previous anesthesia was also reviewed. The risks                            and benefits of the procedure and the sedation                            options and risks were discussed with the patient.                            All questions were answered, and informed consent                            was obtained. Prior Anticoagulants: The patient has                            taken Xarelto  (rivaroxaban ), last dose was 2 days                            prior to procedure. ASA Grade Assessment: III - A                            patient with severe systemic disease. After                            reviewing the risks and benefits, the patient was                            deemed in satisfactory condition to undergo the  procedure.                           After obtaining informed consent, the colonoscope                            was passed under direct vision. Throughout the                            procedure, the patient's blood pressure, pulse, and                            oxygen  saturations were monitored continuously. The                            Olympus Scope [id number] was introduced  through the                            anus and advanced to the 3 cm into the ileum. The                            colonoscopy was performed without difficulty. The                            patient tolerated the procedure. The quality of the                            bowel preparation was adequate. The terminal ileum,                            ileocecal valve, appendiceal orifice, and rectum                            were photographed. Scope In: 7:58:55 AM Scope Out: 8:10:51 AM Scope Withdrawal Time: 0 hours 8 minutes 44 seconds  Total Procedure Duration: 0 hours 11 minutes 56 seconds  Findings:                 Skin tags were found on perianal exam.                           The digital rectal exam findings include                            hemorrhoids. Pertinent negatives include no                            palpable rectal lesions.                           The terminal ileum and ileocecal valve appeared                            normal.  Multiple small-mouthed diverticula were found in                            the recto-sigmoid colon and sigmoid colon.                           Normal mucosa was found in the entire colon.                           Non-bleeding non-thrombosed external and internal                            hemorrhoids were found during retroflexion, during                            perianal exam and during digital exam. The                            hemorrhoids were Grade II (internal hemorrhoids                            that prolapse but reduce spontaneously). Complications:            No immediate complications. Estimated Blood Loss:     Estimated blood loss: none. Impression:               - Perianal skin tags found on perianal exam.                           - Hemorrhoids found on digital rectal exam.                           - The examined portion of the ileum was normal.                           - Diverticulosis in the  recto-sigmoid colon and in                            the sigmoid colon.                           - Normal mucosa in the entire examined colon.                           - Non-bleeding non-thrombosed external and internal                            hemorrhoids. Recommendation:           - The patient will be observed post-procedure,                            until all discharge criteria are met.                           - Discharge patient to home.                           -  Patient has a contact number available for                            emergencies. The signs and symptoms of potential                            delayed complications were discussed with the                            patient. Return to normal activities tomorrow.                            Written discharge instructions were provided to the                            patient.                           - High fiber diet.                           - Use FiberCon 1-2 tablets PO daily.                           - Continue present medications.                           - Repeat colonoscopy in 7 years for surveillance                            due to prior history of adenomas.                           - The findings and recommendations were discussed                            with the patient.                           - The findings and recommendations were discussed                            with the patient's family. Aloha Finner, MD 09/08/2024 8:16:14 AM

## 2024-09-08 NOTE — Progress Notes (Signed)
 "  GASTROENTEROLOGY PROCEDURE H&P NOTE   Primary Care Physician: [name], DO  HPI: [name]. is a 64 y.o. male who presents for surveillance of previous colon polyps.  Past Medical History:  Diagnosis Date   Allergy    Arthritis    Calculus of gallbladder with acute cholecystitis 11/23/2023   Cholangitis (HCC) 11/23/2023   Cholecystitis 11/29/2023   Chronic systolic heart failure (HCC) 09/27/2019   Diabetes mellitus without complication (HCC)    TYPE 2   Diverticulosis    Dizziness 11/11/2022   He was at his baseline state of health until this Sunday, when he experienced dizziness at church. He felt off with blurred vision, and went to wait in the car, but was well enough to drive home. On Monday, he had a similar episode, and had to leave work early, which is very atypical. His dizziness is associated with fatigue and generally not feeling well. It comes and goes, but he hasn't identi   Dyslipidemia    Elevated bilirubin 11/23/2023   GERD (gastroesophageal reflux disease)    Herpes labialis    Hypertension    Longstanding persistent atrial fibrillation (HCC)    Metabolic syndrome    Obesity    PFO (patent foramen ovale)    Proteinuria    Psoriasis    S/P left TKA 06/28/2018   S/P TKR (total knee replacement), right 05/31/2018   Seborrheic dermatitis    Sleep apnea    very compliant with CPAP, PT NEEDS TO BRING OWN MACHINE   TIA (transient ischemic attack) 2009   Past Surgical History:  Procedure Laterality Date   BILIARY STENT PLACEMENT  11/25/2023   Procedure: INSERTION, STENT, BILE DUCT;  Surgeon: Avram Lupita BRAVO, MD;  Location: [facility] ENDOSCOPY;  Service: Gastroenterology;;   CARDIOVERSION N/A 12/02/2016   Procedure: CARDIOVERSION;  Surgeon: Vinie BROCKS Maxcy, MD;  Location: [facility] ENDOSCOPY;  Service: Cardiovascular;  Laterality: N/A;   CHOLECYSTECTOMY N/A 11/29/2023   Procedure: LAPAROSCOPIC CHOLECYSTECTOMY WITH INTRAOPERATIVE CHOLANGIOGRAM;  Surgeon:  Dasie Leonor CROME, MD;  Location: [facility] OR;  Service: General;  Laterality: N/A;   COLONOSCOPY     ERCP N/A 11/25/2023   Procedure: ERCP, WITH INTERVENTION IF INDICATED;  Surgeon: Avram Lupita BRAVO, MD;  Location: [facility] ENDOSCOPY;  Service: Gastroenterology;  Laterality: N/A;  With stent placement   ERCP N/A 01/31/2024   Procedure: ERCP, WITH INTERVENTION IF INDICATED;  Surgeon: Mansouraty, Aloha Mickey., MD;  Location: [facility] ENDOSCOPY;  Service: Gastroenterology;  Laterality: N/A;   ERCP N/A 06/29/2024   Procedure: ERCP, WITH INTERVENTION IF INDICATED;  Surgeon: Wilhelmenia Aloha Mickey., MD;  Location: [facility] ENDOSCOPY;  Service: Gastroenterology;  Laterality: N/A;   ESOPHAGOGASTRODUODENOSCOPY N/A 11/25/2023   Procedure: EGD (ESOPHAGOGASTRODUODENOSCOPY);  Surgeon: Avram Lupita BRAVO, MD;  Location: [facility] ENDOSCOPY;  Service: Gastroenterology;  Laterality: N/A;   EXCISIONAL TOTAL KNEE ARTHROPLASTY WITH ANTIBIOTIC SPACERS Right 03/28/2019   Procedure: EXCISIONAL TOTAL KNEE ARTHROPLASTY WITH ANTIBIOTIC SPACERS;  Surgeon: Ernie Cough, MD;  Location: [facility] ORS;  Service: Orthopedics;  Laterality: Right;  90 mins   I & D KNEE WITH POLY EXCHANGE Right 09/10/2018   Procedure: Right Knee Arthroplasty IRRIGATION AND DEBRIDEMENT KNEE WITH POLY EXCHANGE;  Surgeon: Ernie Cough, MD;  Location: [facility] ORS;  Service: Orthopedics;  Laterality: Right;   I & D KNEE WITH POLY EXCHANGE Right 06/25/2020   Procedure: Open excisional and non excisional debridement right knee, possible aspiration versus open arthrotomy poly exchange;  Surgeon: Ernie Cough, MD;  Location: [facility] ORS;  Service: Orthopedics;  Laterality: Right;   IRRIGATION AND DEBRIDEMENT KNEE Right 07/09/2020   Procedure: IRRIGATION AND DEBRIDEMENT KNEE;  Surgeon: Ernie Cough, MD;  Location: [facility] ORS;  Service: Orthopedics;  Laterality: Right;   JOINT REPLACEMENT  11/03/2010   LT HIP   PATENT FORAMEN OVALE(PFO) CLOSURE     PFO occluder cardiac valve  2006   Dr. Ladona, (hole in  heart)   REIMPLANTATION OF TOTAL KNEE Right 06/15/2019   Procedure: Resection of antibiotic spacer and  irrigation and debridment and placement of new antibiotic spacer and components;  Surgeon: Ernie Cough, MD;  Location: [facility] ORS;  Service: Orthopedics;  Laterality: Right;  90 mins   REIMPLANTATION OF TOTAL KNEE Right 08/22/2019   Procedure: REIMPLANTATION OF TOTAL KNEE;  Surgeon: Ernie Cough, MD;  Location: [facility] ORS;  Service: Orthopedics;  Laterality: Right;  120 mins   SKIN BIOPSY Left 12/22/2019   epidermal inclusion cyst   SPYGLASS CHOLANGIOSCOPY N/A 01/31/2024   Procedure: CHOLANGIOSCOPY, USING SPYGLASS;  Surgeon: Wilhelmenia Aloha Raddle., MD;  Location: [facility] ENDOSCOPY;  Service: Gastroenterology;  Laterality: N/A;   TONSILLECTOMY AND ADENOIDECTOMY     TOTAL HIP ARTHROPLASTY Left    TOTAL KNEE ARTHROPLASTY Right 05/31/2018   Procedure: RIGHT TOTAL KNEE ARTHROPLASTY;  Surgeon: Ernie Cough, MD;  Location: [facility] ORS;  Service: Orthopedics;  Laterality: Right;  70 mins   TOTAL KNEE ARTHROPLASTY Left 06/28/2018   Procedure: LEFT TOTAL KNEE ARTHROPLASTY;  Surgeon: Ernie Cough, MD;  Location: [facility] ORS;  Service: Orthopedics;  Laterality: Left;  70 mins   WISDOM TOOTH EXTRACTION     Current Outpatient Medications  Medication Sig Dispense Refill   Ascorbic Acid (VITAMIN C) 100 MG tablet Take 100 mg by mouth daily.     b complex vitamins capsule Take 1 capsule by mouth daily.     carvedilol  (COREG ) 12.5 MG tablet Take 1 tablet (12.5 mg total) by mouth 2 (two) times daily. 180 tablet 3   cefadroxil  (DURICEF) 500 MG capsule Take 1 capsule (500 mg total) by mouth 2 (two) times daily. 180 capsule 3   empagliflozin  (JARDIANCE ) 10 MG TABS tablet Take 1 tablet (10 mg total) by mouth daily. 90 tablet 1   Multiple Vitamin (MULTIVITAMIN WITH MINERALS) TABS tablet Take 1 tablet by mouth daily.     Oxymetazoline  HCl (NASAL SPRAY) 0.05 % SOLN Place 1 spray into the nose at bedtime as needed (congestion).      pantoprazole  (PROTONIX ) 40 MG tablet Take 1 tablet (40 mg total) by mouth daily. 180 tablet 0   rosuvastatin  (CRESTOR ) 10 MG tablet Take 1 tablet (10 mg total) by mouth daily. 90 tablet 1   sacubitril -valsartan  (ENTRESTO ) 24-26 MG Take 1 tablet by mouth 2 (two) times daily. 180 tablet 1   Vitamin D , Cholecalciferol , 1000 units CAPS Take 1,000 Units by mouth daily.      acetaminophen  (TYLENOL ) 500 MG tablet Take 500 mg by mouth daily as needed for mild pain (pain score 1-3) or moderate pain (pain score 4-6).     mupirocin  ointment (BACTROBAN ) 2 % Apply to affected area twice a day. 22 g 0   rivaroxaban  (XARELTO ) 20 MG TABS tablet Take 1 tablet (20 mg total) by mouth daily with supper. 90 tablet 3   TALTZ  80 MG/ML pen Inject 1 ml (80mg ) under the skin every 4 weeks. 1 mL 11   tirzepatide  (MOUNJARO ) 15 MG/0.5ML Pen Inject 15 mg into the skin once a week. 6 mL 3   Current Facility-Administered  Medications  Medication Dose Route Frequency Provider Last Rate Last Admin   0.9 %  sodium chloride  infusion  500 mL Intravenous Continuous Mansouraty, Aloha Raddle., MD       Current Medications[1] Allergies[2] Family History  Problem Relation Age of Onset   Uterine cancer Mother 11       Cervical cath   Crohn's disease Mother    Esophageal cancer Father    Cancer Other    Obesity Other    Colon cancer Neg Hx    Inflammatory bowel disease Neg Hx    Liver disease Neg Hx    Pancreatic cancer Neg Hx    Rectal cancer Neg Hx    Stomach cancer Neg Hx    Social History   Socioeconomic History   Marital status: Married    Spouse name: Not on file   Number of children: 2   Years of education: Not on file   Highest education level: Bachelor's degree (e.g., BA, AB, BS)  Occupational History   Occupation: Information Systems Manager: [employer]  Tobacco Use   Smoking status: Never   Smokeless tobacco: Never  Vaping Use   Vaping status: Never Used  Substance and Sexual Activity   Alcohol   use: Yes    Comment: rarely   Drug use: No   Sexual activity: Yes  Other Topics Concern   Not on file  Social History Narrative   Lives with spouse in [place]   Wife works for [employer] teaching program   Owns Social Worker   Social Drivers of Health   Tobacco Use: Low Risk (09/08/2024)   Patient History    Smoking Tobacco Use: Never    Smokeless Tobacco Use: Never    Passive Exposure: Not on file  Financial Resource Strain: Low Risk (05/29/2024)   Overall Financial Resource Strain (CARDIA)    Difficulty of Paying Living Expenses: Not very hard  Food Insecurity: No Food Insecurity (05/29/2024)   Epic    Worried About Radiation Protection Practitioner of Food in the Last Year: Never true    Ran Out of Food in the Last Year: Never true  Transportation Needs: No Transportation Needs (05/29/2024)   Epic    Lack of Transportation (Medical): No    Lack of Transportation (Non-Medical): No  Physical Activity: Insufficiently Active (05/29/2024)   Exercise Vital Sign    Days of Exercise per Week: 2 days    Minutes of Exercise per Session: 60 min  Stress: Stress Concern Present (05/29/2024)   [employer] of Occupational Health - Occupational Stress Questionnaire    Feeling of Stress: To some extent  Social Connections: Socially Integrated (05/29/2024)   Social Connection and Isolation Panel    Frequency of Communication with Friends and Family: More than three times a week    Frequency of Social Gatherings with Friends and Family: Twice a week    Attends Religious Services: More than 4 times per year    Active Member of [employer] or Organizations: Yes    Attends Banker Meetings: More than 4 times per year    Marital Status: Married  Catering Manager Violence: Not At Risk (11/29/2023)   Humiliation, Afraid, Rape, and Kick questionnaire    Fear of Current or Ex-Partner: No    Emotionally Abused: No    Physically Abused: No    Sexually Abused: No  Depression (PHQ2-9): Low Risk  (06/14/2024)   Depression (PHQ2-9)    PHQ-2 Score: 0  Alcohol  Screen:  Low Risk (05/29/2024)   Alcohol  Screen    Last Alcohol  Screening Score (AUDIT): 1  Housing: Low Risk (05/29/2024)   Epic    Unable to Pay for Housing in the Last Year: No    Number of Times Moved in the Last Year: 0    Homeless in the Last Year: No  Utilities: Not At Risk (06/14/2024)   Epic    Threatened with loss of utilities: No  Health Literacy: Not on file    Physical Exam: Today's Vitals   09/08/24 0713 09/08/24 0714  BP: (!) 95/53   Pulse: 71   Temp: (!) 97.2 F (36.2 C) (!) 97.2 F (36.2 C)  SpO2: 96%   Weight: 246 lb 9.6 oz (111.9 kg)   Height: 5' 10 (1.778 m)    Body mass index is 35.38 kg/m. GEN: NAD EYE: Sclerae anicteric ENT: MMM CV: Non-tachycardic GI: Soft, NT/ND NEURO:  Alert & Oriented x 3  Lab Results: No results for input(s): WBC, HGB, HCT, PLT in the last 72 hours. BMET No results for input(s): NA, K, CL, CO2, GLUCOSE, BUN, CREATININE, CALCIUM  in the last 72 hours. LFT No results for input(s): PROT, ALBUMIN , AST, ALT, ALKPHOS, BILITOT, BILIDIR, IBILI in the last 72 hours. PT/INR No results for input(s): LABPROT, INR in the last 72 hours.   Impression / Plan: This is a 64 y.o.male who presents for surveillance of previous colon polyps.  The risks and benefits of endoscopic evaluation/treatment were discussed with the patient and/or family; these include but are not limited to the risk of perforation, infection, bleeding, missed lesions, lack of diagnosis, severe illness requiring hospitalization, as well as anesthesia and sedation related illnesses.  The patient's history has been reviewed, patient examined, no change in status, and deemed stable for procedure.  The patient and/or family was provided an opportunity to ask questions and all were answered.  The patient and/or family is agreeable to proceed.    Aloha Finner,  MD [facility] Office # [phone]     [1]  Current Outpatient Medications:    Ascorbic Acid (VITAMIN C) 100 MG tablet, Take 100 mg by mouth daily., Disp: , Rfl:    b complex vitamins capsule, Take 1 capsule by mouth daily., Disp: , Rfl:    carvedilol  (COREG ) 12.5 MG tablet, Take 1 tablet (12.5 mg total) by mouth 2 (two) times daily., Disp: 180 tablet, Rfl: 3   cefadroxil  (DURICEF) 500 MG capsule, Take 1 capsule (500 mg total) by mouth 2 (two) times daily., Disp: 180 capsule, Rfl: 3   empagliflozin  (JARDIANCE ) 10 MG TABS tablet, Take 1 tablet (10 mg total) by mouth daily., Disp: 90 tablet, Rfl: 1   Multiple Vitamin (MULTIVITAMIN WITH MINERALS) TABS tablet, Take 1 tablet by mouth daily., Disp: , Rfl:    Oxymetazoline  HCl (NASAL SPRAY) 0.05 % SOLN, Place 1 spray into the nose at bedtime as needed (congestion)., Disp: , Rfl:    pantoprazole  (PROTONIX ) 40 MG tablet, Take 1 tablet (40 mg total) by mouth daily., Disp: 180 tablet, Rfl: 0   rosuvastatin  (CRESTOR ) 10 MG tablet, Take 1 tablet (10 mg total) by mouth daily., Disp: 90 tablet, Rfl: 1   sacubitril -valsartan  (ENTRESTO ) 24-26 MG, Take 1 tablet by mouth 2 (two) times daily., Disp: 180 tablet, Rfl: 1   Vitamin D , Cholecalciferol , 1000 units CAPS, Take 1,000 Units by mouth daily. , Disp: , Rfl:    acetaminophen  (TYLENOL ) 500 MG tablet, Take 500 mg by mouth daily as  needed for mild pain (pain score 1-3) or moderate pain (pain score 4-6)., Disp: , Rfl:    mupirocin  ointment (BACTROBAN ) 2 %, Apply to affected area twice a day., Disp: 22 g, Rfl: 0   rivaroxaban  (XARELTO ) 20 MG TABS tablet, Take 1 tablet (20 mg total) by mouth daily with supper., Disp: 90 tablet, Rfl: 3   TALTZ  80 MG/ML pen, Inject 1 ml (80mg ) under the skin every 4 weeks., Disp: 1 mL, Rfl: 11   tirzepatide  (MOUNJARO ) 15 MG/0.5ML Pen, Inject 15 mg into the skin once a week., Disp: 6 mL, Rfl: 3  Current Facility-Administered Medications:    0.9 %   sodium chloride  infusion, 500 mL, Intravenous, Continuous, Mansouraty, Aloha Raddle., MD [2]  Allergies Allergen Reactions   Morphine  Other (See Comments)    hallucinations   "

## 2024-09-08 NOTE — Patient Instructions (Addendum)
 YOU HAD AN ENDOSCOPIC PROCEDURE TODAY AT THE [facility]:   Refer to the procedure report that was given to you for any specific questions about what was found during the examination.  If the procedure report does not answer your questions, please call your gastroenterologist to clarify.  If you requested that your care partner not be given the details of your procedure findings, then the procedure report has been included in a sealed envelope for you to review at your convenience later.  YOU SHOULD EXPECT: Some feelings of bloating in the abdomen. Passage of more gas than usual.  Walking can help get rid of the air that was put into your GI tract during the procedure and reduce the bloating. If you had a lower endoscopy (such as a colonoscopy or flexible sigmoidoscopy) you may notice spotting of blood in your stool or on the toilet paper. If you underwent a bowel prep for your procedure, you may not have a normal bowel movement for a few days.  Please Note:  You might notice some irritation and congestion in your nose or some drainage.  This is from the oxygen  used during your procedure.  There is no need for concern and it should clear up in a day or so.  SYMPTOMS TO REPORT IMMEDIATELY:  Following lower endoscopy (colonoscopy or flexible sigmoidoscopy):  Excessive amounts of blood in the stool  Significant tenderness or worsening of abdominal pains  Swelling of the abdomen that is new, acute  Fever of 100F or higher   For urgent or emergent issues, a gastroenterologist can be reached at any hour by calling [phone]. Do not use MyChart messaging for urgent concerns.    DIET:  We do recommend a small meal at first, but then you may proceed to your regular diet.  Drink plenty of fluids but you should avoid alcoholic beverages for 24 hours. Follow a High Fiber Diet.  MEDICATIONS: Continue present medications. Resume Xarelto  today at previous dose. Use FiberCon 1-2 tablets by  mouth daily.  FOLLOW UP: Repeat colonoscopy in 7 years for surveillance due to prior history of adenomas.  Educational handouts given tom patient: Diverticulosis, Hemorrhoids, High Fiber Diet.  Thank you for allowing us  to provide for your healthcare needs today.  ACTIVITY:  You should plan to take it easy for the rest of today and you should NOT DRIVE or use heavy machinery until tomorrow (because of the sedation medicines used during the test).    FOLLOW UP: Our staff will call the number listed on your records the next business day following your procedure.  We will call around 7:15- 8:00 am to check on you and address any questions or concerns that you may have regarding the information given to you following your procedure. If we do not reach you, we will leave a message.     If any biopsies were taken you will be contacted by phone or by letter within the next 1-3 weeks.  Please call us  at [phone] if you have not heard about the biopsies in 3 weeks.    SIGNATURES/CONFIDENTIALITY: You and/or your care partner have signed paperwork which will be entered into your electronic medical record.  These signatures attest to the fact that that the information above on your After Visit Summary has been reviewed and is understood.  Full responsibility of the confidentiality of this discharge information lies with you and/or your care-partner.

## 2024-09-11 ENCOUNTER — Telehealth: Payer: Self-pay | Admitting: *Deleted

## 2024-09-11 ENCOUNTER — Other Ambulatory Visit: Payer: Self-pay | Admitting: Pharmacist

## 2024-09-11 ENCOUNTER — Other Ambulatory Visit (HOSPITAL_COMMUNITY): Payer: Self-pay

## 2024-09-11 ENCOUNTER — Other Ambulatory Visit: Payer: Self-pay

## 2024-09-11 MED ORDER — TALTZ 80 MG/ML ~~LOC~~ SOAJ
SUBCUTANEOUS | 11 refills | Status: DC
  Filled 2024-09-11: qty 1, fill #0

## 2024-09-11 MED ORDER — TALTZ 80 MG/ML ~~LOC~~ SOAJ
SUBCUTANEOUS | 11 refills | Status: DC
  Filled 2024-09-11 – 2024-09-12 (×2): qty 1, 28d supply, fill #0

## 2024-09-11 NOTE — Telephone Encounter (Signed)
" °  Follow up Call-     09/08/2024    7:14 AM  Call back number  Post procedure Call Back phone  # [phone]  Permission to leave phone message Yes     Patient questions:  Do you have a fever, pain , or abdominal swelling? No. Pain Score  0 *  Have you tolerated food without any problems? Yes.    Have you been able to return to your normal activities? Yes.    Do you have any questions about your discharge instructions: Diet   No. Medications  No. Follow up visit  No.  Do you have questions or concerns about your Care? No.  Actions: * If pain score is 4 or above: No action needed, pain <4.   "

## 2024-09-12 ENCOUNTER — Other Ambulatory Visit: Payer: Self-pay | Admitting: Pharmacy Technician

## 2024-09-12 ENCOUNTER — Other Ambulatory Visit: Payer: Self-pay | Admitting: Pharmacist

## 2024-09-12 ENCOUNTER — Other Ambulatory Visit: Payer: Self-pay

## 2024-09-12 ENCOUNTER — Encounter: Payer: Self-pay | Admitting: Internal Medicine

## 2024-09-12 ENCOUNTER — Encounter (INDEPENDENT_AMBULATORY_CARE_PROVIDER_SITE_OTHER): Payer: Self-pay

## 2024-09-12 MED ORDER — TALTZ 80 MG/ML ~~LOC~~ SOAJ
SUBCUTANEOUS | 11 refills | Status: AC
  Filled 2024-09-12: qty 1, fill #0
  Filled 2024-09-12 (×2): qty 1, 28d supply, fill #0
  Filled 2024-10-06: qty 1, 28d supply, fill #1

## 2024-09-13 ENCOUNTER — Other Ambulatory Visit: Payer: Self-pay | Admitting: Internal Medicine

## 2024-09-13 ENCOUNTER — Other Ambulatory Visit: Payer: Self-pay

## 2024-09-13 DIAGNOSIS — D751 Secondary polycythemia: Secondary | ICD-10-CM

## 2024-09-13 NOTE — Progress Notes (Signed)
 Placed order for future CBC.

## 2024-09-13 NOTE — Progress Notes (Signed)
 Specialty Pharmacy Refill Coordination Note  [name]. is a 64 y.o. male contacted today regarding refills of specialty medication(s) Ixekizumab  (Taltz )   Patient requested [name] at [facility] at [place] date: 09/14/24   Medication will be filled on: 09/13/24

## 2024-09-14 ENCOUNTER — Other Ambulatory Visit: Payer: Self-pay | Admitting: Internal Medicine

## 2024-09-14 ENCOUNTER — Other Ambulatory Visit (HOSPITAL_COMMUNITY): Payer: Self-pay

## 2024-09-14 DIAGNOSIS — K219 Gastro-esophageal reflux disease without esophagitis: Secondary | ICD-10-CM

## 2024-09-15 ENCOUNTER — Other Ambulatory Visit (HOSPITAL_COMMUNITY): Payer: Self-pay

## 2024-09-15 MED ORDER — PANTOPRAZOLE SODIUM 40 MG PO TBEC
40.0000 mg | DELAYED_RELEASE_TABLET | Freq: Every day | ORAL | 0 refills | Status: AC
  Filled 2024-09-15: qty 90, 90d supply, fill #0

## 2024-09-15 NOTE — Telephone Encounter (Signed)
 Medication sent to pharmacy

## 2024-09-19 ENCOUNTER — Other Ambulatory Visit

## 2024-09-19 DIAGNOSIS — D751 Secondary polycythemia: Secondary | ICD-10-CM | POA: Diagnosis not present

## 2024-09-19 LAB — CBC
HCT: 53.9 % — ABNORMAL HIGH (ref 39.0–52.0)
Hemoglobin: 17.9 g/dL — ABNORMAL HIGH (ref 13.0–17.0)
MCH: 26.2 pg (ref 26.0–34.0)
MCHC: 33.2 g/dL (ref 30.0–36.0)
MCV: 78.8 fL — ABNORMAL LOW (ref 80.0–100.0)
Platelets: 165 K/uL (ref 150–400)
RBC: 6.84 MIL/uL — ABNORMAL HIGH (ref 4.22–5.81)
RDW: 19.3 % — ABNORMAL HIGH (ref 11.5–15.5)
WBC: 5.7 K/uL (ref 4.0–10.5)
nRBC: 0 % (ref 0.0–0.2)

## 2024-09-20 ENCOUNTER — Other Ambulatory Visit (HOSPITAL_COMMUNITY): Payer: Self-pay

## 2024-09-27 NOTE — Telephone Encounter (Unsigned)
 Copied from CRM [id number]. Topic: General - Other >> Sep 26, 2024  4:30 PM Graeme ORN wrote: Reason for CRM: Received a call from Reena Louder with law office. Faxed Causation request to PCP 1/9. Checking status. Would like a call back. Thank You

## 2024-09-27 NOTE — Telephone Encounter (Addendum)
 Returning phone called to Ms. Reena Louder with [employer] (attorney at law) regarding form that I received for this pt. The reception that answer the phone called states Reena Louder is not in the office. [facility] office received the form on 09/20/2024. The form is in Dr. Lovie box, waiting for her to complete.

## 2024-10-06 ENCOUNTER — Encounter: Payer: Self-pay | Admitting: Internal Medicine

## 2024-10-06 ENCOUNTER — Other Ambulatory Visit: Payer: Self-pay

## 2024-10-06 ENCOUNTER — Telehealth: Payer: Self-pay | Admitting: Physician Assistant

## 2024-10-06 ENCOUNTER — Other Ambulatory Visit (HOSPITAL_COMMUNITY): Payer: Self-pay

## 2024-10-06 NOTE — Telephone Encounter (Signed)
 Rescheduled appointment per room/resource from nursing leadership. Called and left a VM with the appointment details for the patient.

## 2024-10-06 NOTE — Progress Notes (Signed)
 Specialty Pharmacy Refill Coordination Note  [name]. is a 64 y.o. male contacted today regarding refills of specialty medication(s) Ixekizumab  (Taltz )   Patient requested [name] at [facility] at [place] date: 10/11/24   Medication will be filled on: 10/10/24

## 2024-10-12 ENCOUNTER — Inpatient Hospital Stay: Attending: Internal Medicine

## 2025-01-10 ENCOUNTER — Ambulatory Visit: Admitting: Internal Medicine
